# Patient Record
Sex: Male | Born: 1965
Health system: Southern US, Community
[De-identification: ages and names within clinical notes are randomized; demographics above are authoritative.]

## PROBLEM LIST (undated history)

## (undated) DIAGNOSIS — R079 Chest pain, unspecified: Secondary | ICD-10-CM

## (undated) DIAGNOSIS — K219 Gastro-esophageal reflux disease without esophagitis: Secondary | ICD-10-CM

## (undated) DIAGNOSIS — K449 Diaphragmatic hernia without obstruction or gangrene: Secondary | ICD-10-CM

## (undated) DIAGNOSIS — R55 Syncope and collapse: Secondary | ICD-10-CM

## (undated) DIAGNOSIS — G8929 Other chronic pain: Secondary | ICD-10-CM

## (undated) DIAGNOSIS — I10 Essential (primary) hypertension: Secondary | ICD-10-CM

## (undated) DIAGNOSIS — E2609 Other primary hyperaldosteronism: Secondary | ICD-10-CM

## (undated) DIAGNOSIS — D497 Neoplasm of unspecified behavior of endocrine glands and other parts of nervous system: Secondary | ICD-10-CM

## (undated) DIAGNOSIS — R569 Unspecified convulsions: Secondary | ICD-10-CM

## (undated) DIAGNOSIS — M1990 Unspecified osteoarthritis, unspecified site: Secondary | ICD-10-CM

## (undated) DIAGNOSIS — M545 Low back pain, unspecified: Secondary | ICD-10-CM

## (undated) DIAGNOSIS — I2510 Atherosclerotic heart disease of native coronary artery without angina pectoris: Secondary | ICD-10-CM

## (undated) DIAGNOSIS — G4730 Sleep apnea, unspecified: Secondary | ICD-10-CM

## (undated) DIAGNOSIS — F419 Anxiety disorder, unspecified: Secondary | ICD-10-CM

## (undated) DIAGNOSIS — K2270 Barrett's esophagus without dysplasia: Secondary | ICD-10-CM

## (undated) DIAGNOSIS — E785 Hyperlipidemia, unspecified: Secondary | ICD-10-CM

## (undated) DIAGNOSIS — M549 Dorsalgia, unspecified: Secondary | ICD-10-CM

## (undated) DIAGNOSIS — F32A Depression, unspecified: Secondary | ICD-10-CM

## (undated) DIAGNOSIS — F329 Major depressive disorder, single episode, unspecified: Secondary | ICD-10-CM

## (undated) DIAGNOSIS — E119 Type 2 diabetes mellitus without complications: Secondary | ICD-10-CM

## (undated) DIAGNOSIS — F4310 Post-traumatic stress disorder, unspecified: Secondary | ICD-10-CM

## (undated) DIAGNOSIS — N200 Calculus of kidney: Secondary | ICD-10-CM

## (undated) HISTORY — DX: Hyperlipidemia, unspecified: E78.5

## (undated) HISTORY — PX: LITHOTRIPSY: SUR834

## (undated) HISTORY — PX: KNEE ARTHROSCOPY: SHX127

## (undated) HISTORY — DX: Chest pain, unspecified: R07.9

## (undated) HISTORY — PX: KIDNEY STONE SURGERY: SHX686

## (undated) HISTORY — DX: Gastro-esophageal reflux disease without esophagitis: K21.9

## (undated) HISTORY — DX: Diaphragmatic hernia without obstruction or gangrene: K44.9

## (undated) HISTORY — DX: Other primary hyperaldosteronism: E26.09

## (undated) HISTORY — DX: Unspecified convulsions: R56.9

## (undated) HISTORY — DX: Other chronic pain: G89.29

## (undated) HISTORY — DX: Unspecified osteoarthritis, unspecified site: M19.90

## (undated) HISTORY — DX: Barrett's esophagus without dysplasia: K22.70

---
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
## 1998-11-23 ENCOUNTER — Encounter: Payer: Self-pay | Admitting: Surgery

## 1998-11-23 ENCOUNTER — Ambulatory Visit (HOSPITAL_COMMUNITY): Admission: RE | Admit: 1998-11-23 | Discharge: 1998-11-23 | Payer: Self-pay | Admitting: Surgery

## 1998-11-23 ENCOUNTER — Emergency Department (HOSPITAL_COMMUNITY): Admission: EM | Admit: 1998-11-23 | Discharge: 1998-11-23 | Payer: Self-pay

## 1999-08-14 ENCOUNTER — Encounter: Payer: Self-pay | Admitting: Urology

## 1999-08-14 ENCOUNTER — Encounter: Admission: RE | Admit: 1999-08-14 | Discharge: 1999-08-14 | Payer: Self-pay | Admitting: Urology

## 1999-11-19 ENCOUNTER — Ambulatory Visit (HOSPITAL_COMMUNITY): Admission: RE | Admit: 1999-11-19 | Discharge: 1999-11-19 | Payer: Self-pay | Admitting: Gastroenterology

## 1999-11-19 ENCOUNTER — Encounter (INDEPENDENT_AMBULATORY_CARE_PROVIDER_SITE_OTHER): Payer: Self-pay

## 2001-09-02 ENCOUNTER — Encounter: Payer: Self-pay | Admitting: Urology

## 2001-09-02 ENCOUNTER — Ambulatory Visit (HOSPITAL_BASED_OUTPATIENT_CLINIC_OR_DEPARTMENT_OTHER): Admission: RE | Admit: 2001-09-02 | Discharge: 2001-09-02 | Payer: Self-pay | Admitting: Urology

## 2002-04-25 ENCOUNTER — Emergency Department (HOSPITAL_COMMUNITY): Admission: EM | Admit: 2002-04-25 | Discharge: 2002-04-25 | Payer: Self-pay | Admitting: Emergency Medicine

## 2002-06-01 ENCOUNTER — Emergency Department (HOSPITAL_COMMUNITY): Admission: EM | Admit: 2002-06-01 | Discharge: 2002-06-01 | Payer: Self-pay | Admitting: Emergency Medicine

## 2002-06-01 ENCOUNTER — Encounter: Payer: Self-pay | Admitting: Emergency Medicine

## 2002-09-12 ENCOUNTER — Emergency Department (HOSPITAL_COMMUNITY): Admission: EM | Admit: 2002-09-12 | Discharge: 2002-09-12 | Payer: Self-pay | Admitting: Emergency Medicine

## 2002-09-12 ENCOUNTER — Encounter (INDEPENDENT_AMBULATORY_CARE_PROVIDER_SITE_OTHER): Payer: Self-pay | Admitting: *Deleted

## 2002-11-07 ENCOUNTER — Emergency Department (HOSPITAL_COMMUNITY): Admission: EM | Admit: 2002-11-07 | Discharge: 2002-11-07 | Payer: Self-pay | Admitting: Emergency Medicine

## 2002-12-28 ENCOUNTER — Encounter: Admission: RE | Admit: 2002-12-28 | Discharge: 2003-02-09 | Payer: Self-pay | Admitting: Orthopedic Surgery

## 2003-05-14 ENCOUNTER — Emergency Department (HOSPITAL_COMMUNITY): Admission: EM | Admit: 2003-05-14 | Discharge: 2003-05-15 | Payer: Self-pay | Admitting: Emergency Medicine

## 2003-05-15 ENCOUNTER — Encounter: Payer: Self-pay | Admitting: Emergency Medicine

## 2003-05-19 ENCOUNTER — Emergency Department (HOSPITAL_COMMUNITY): Admission: EM | Admit: 2003-05-19 | Discharge: 2003-05-20 | Payer: Self-pay | Admitting: Emergency Medicine

## 2003-08-02 ENCOUNTER — Encounter
Admission: RE | Admit: 2003-08-02 | Discharge: 2003-10-31 | Payer: Self-pay | Admitting: Physical Medicine and Rehabilitation

## 2003-08-29 ENCOUNTER — Emergency Department (HOSPITAL_COMMUNITY): Admission: EM | Admit: 2003-08-29 | Discharge: 2003-08-30 | Payer: Self-pay | Admitting: Emergency Medicine

## 2003-10-31 ENCOUNTER — Emergency Department (HOSPITAL_COMMUNITY): Admission: EM | Admit: 2003-10-31 | Discharge: 2003-10-31 | Payer: Self-pay | Admitting: Emergency Medicine

## 2004-01-08 ENCOUNTER — Emergency Department (HOSPITAL_COMMUNITY): Admission: EM | Admit: 2004-01-08 | Discharge: 2004-01-08 | Payer: Self-pay | Admitting: Family Medicine

## 2004-06-04 ENCOUNTER — Encounter
Admission: RE | Admit: 2004-06-04 | Discharge: 2004-09-02 | Payer: Self-pay | Admitting: Physical Medicine and Rehabilitation

## 2004-06-06 ENCOUNTER — Ambulatory Visit: Payer: Self-pay | Admitting: Physical Medicine and Rehabilitation

## 2004-06-13 ENCOUNTER — Encounter
Admission: RE | Admit: 2004-06-13 | Discharge: 2004-08-14 | Payer: Self-pay | Admitting: Physical Medicine and Rehabilitation

## 2004-06-13 ENCOUNTER — Ambulatory Visit: Payer: Self-pay | Admitting: Family Medicine

## 2004-06-28 ENCOUNTER — Emergency Department (HOSPITAL_COMMUNITY): Admission: EM | Admit: 2004-06-28 | Discharge: 2004-06-28 | Payer: Self-pay | Admitting: *Deleted

## 2004-07-01 ENCOUNTER — Ambulatory Visit (HOSPITAL_COMMUNITY): Admission: RE | Admit: 2004-07-01 | Discharge: 2004-07-01 | Payer: Self-pay | Admitting: Family Medicine

## 2004-07-01 ENCOUNTER — Ambulatory Visit: Payer: Self-pay | Admitting: Family Medicine

## 2004-07-12 ENCOUNTER — Ambulatory Visit: Payer: Self-pay | Admitting: Sports Medicine

## 2004-11-13 ENCOUNTER — Ambulatory Visit: Payer: Self-pay | Admitting: Family Medicine

## 2004-12-04 ENCOUNTER — Encounter (INDEPENDENT_AMBULATORY_CARE_PROVIDER_SITE_OTHER): Payer: Self-pay | Admitting: *Deleted

## 2004-12-04 ENCOUNTER — Ambulatory Visit (HOSPITAL_COMMUNITY): Admission: RE | Admit: 2004-12-04 | Discharge: 2004-12-04 | Payer: Self-pay | Admitting: Gastroenterology

## 2004-12-24 ENCOUNTER — Ambulatory Visit: Payer: Self-pay | Admitting: Family Medicine

## 2005-01-21 ENCOUNTER — Emergency Department (HOSPITAL_COMMUNITY): Admission: EM | Admit: 2005-01-21 | Discharge: 2005-01-21 | Payer: Self-pay | Admitting: Emergency Medicine

## 2005-01-29 ENCOUNTER — Ambulatory Visit (HOSPITAL_COMMUNITY): Admission: RE | Admit: 2005-01-29 | Discharge: 2005-01-29 | Payer: Self-pay | Admitting: Family Medicine

## 2005-01-29 ENCOUNTER — Ambulatory Visit: Payer: Self-pay | Admitting: Family Medicine

## 2005-02-19 ENCOUNTER — Ambulatory Visit: Payer: Self-pay | Admitting: Family Medicine

## 2005-02-26 ENCOUNTER — Ambulatory Visit: Payer: Self-pay | Admitting: Family Medicine

## 2005-03-11 ENCOUNTER — Ambulatory Visit: Payer: Self-pay | Admitting: Family Medicine

## 2005-03-28 ENCOUNTER — Ambulatory Visit: Payer: Self-pay | Admitting: Family Medicine

## 2005-04-22 ENCOUNTER — Emergency Department (HOSPITAL_COMMUNITY): Admission: EM | Admit: 2005-04-22 | Discharge: 2005-04-22 | Payer: Self-pay | Admitting: Family Medicine

## 2005-04-28 ENCOUNTER — Ambulatory Visit: Payer: Self-pay | Admitting: Family Medicine

## 2005-05-06 ENCOUNTER — Ambulatory Visit: Payer: Self-pay | Admitting: Sports Medicine

## 2005-05-19 ENCOUNTER — Ambulatory Visit: Payer: Self-pay | Admitting: Sports Medicine

## 2005-12-03 ENCOUNTER — Ambulatory Visit: Payer: Self-pay | Admitting: Family Medicine

## 2006-02-09 ENCOUNTER — Ambulatory Visit: Payer: Self-pay | Admitting: Sports Medicine

## 2006-03-28 ENCOUNTER — Emergency Department (HOSPITAL_COMMUNITY): Admission: EM | Admit: 2006-03-28 | Discharge: 2006-03-28 | Payer: Self-pay | Admitting: Emergency Medicine

## 2006-07-16 ENCOUNTER — Ambulatory Visit (HOSPITAL_COMMUNITY): Admission: RE | Admit: 2006-07-16 | Discharge: 2006-07-16 | Payer: Self-pay | Admitting: Sports Medicine

## 2006-07-16 ENCOUNTER — Ambulatory Visit: Payer: Self-pay | Admitting: Sports Medicine

## 2006-08-12 ENCOUNTER — Ambulatory Visit: Payer: Self-pay | Admitting: Sports Medicine

## 2006-08-12 ENCOUNTER — Ambulatory Visit: Payer: Self-pay | Admitting: Cardiology

## 2006-08-19 ENCOUNTER — Encounter: Payer: Self-pay | Admitting: Cardiology

## 2006-08-19 ENCOUNTER — Ambulatory Visit (HOSPITAL_COMMUNITY): Admission: RE | Admit: 2006-08-19 | Discharge: 2006-08-19 | Payer: Self-pay | Admitting: Family Medicine

## 2006-10-29 DIAGNOSIS — K22711 Barrett's esophagus with high grade dysplasia: Secondary | ICD-10-CM | POA: Insufficient documentation

## 2006-10-29 DIAGNOSIS — K2270 Barrett's esophagus without dysplasia: Secondary | ICD-10-CM

## 2007-01-06 ENCOUNTER — Ambulatory Visit: Payer: Self-pay | Admitting: Family Medicine

## 2007-01-06 ENCOUNTER — Telehealth: Payer: Self-pay | Admitting: *Deleted

## 2007-02-08 ENCOUNTER — Telehealth (INDEPENDENT_AMBULATORY_CARE_PROVIDER_SITE_OTHER): Payer: Self-pay | Admitting: Family Medicine

## 2007-02-25 ENCOUNTER — Telehealth: Payer: Self-pay | Admitting: *Deleted

## 2007-03-26 ENCOUNTER — Telehealth (INDEPENDENT_AMBULATORY_CARE_PROVIDER_SITE_OTHER): Payer: Self-pay | Admitting: *Deleted

## 2007-03-29 ENCOUNTER — Ambulatory Visit: Payer: Self-pay | Admitting: Family Medicine

## 2007-05-05 ENCOUNTER — Encounter (INDEPENDENT_AMBULATORY_CARE_PROVIDER_SITE_OTHER): Payer: Self-pay | Admitting: Family Medicine

## 2007-05-05 ENCOUNTER — Ambulatory Visit: Payer: Self-pay | Admitting: Family Medicine

## 2007-06-02 ENCOUNTER — Encounter (INDEPENDENT_AMBULATORY_CARE_PROVIDER_SITE_OTHER): Payer: Self-pay | Admitting: Family Medicine

## 2007-07-01 ENCOUNTER — Ambulatory Visit: Payer: Self-pay | Admitting: Family Medicine

## 2007-10-18 ENCOUNTER — Ambulatory Visit: Payer: Self-pay | Admitting: Sports Medicine

## 2007-12-23 ENCOUNTER — Ambulatory Visit: Payer: Self-pay | Admitting: Family Medicine

## 2008-02-11 ENCOUNTER — Encounter: Payer: Self-pay | Admitting: *Deleted

## 2008-02-29 ENCOUNTER — Telehealth (INDEPENDENT_AMBULATORY_CARE_PROVIDER_SITE_OTHER): Payer: Self-pay | Admitting: Family Medicine

## 2008-04-05 ENCOUNTER — Telehealth: Payer: Self-pay | Admitting: *Deleted

## 2008-05-02 ENCOUNTER — Ambulatory Visit (HOSPITAL_COMMUNITY): Admission: RE | Admit: 2008-05-02 | Discharge: 2008-05-02 | Payer: Self-pay | Admitting: Family Medicine

## 2008-05-02 ENCOUNTER — Ambulatory Visit: Payer: Self-pay | Admitting: Sports Medicine

## 2008-05-02 DIAGNOSIS — I10 Essential (primary) hypertension: Secondary | ICD-10-CM

## 2008-05-03 ENCOUNTER — Telehealth (INDEPENDENT_AMBULATORY_CARE_PROVIDER_SITE_OTHER): Payer: Self-pay | Admitting: Family Medicine

## 2008-05-03 ENCOUNTER — Emergency Department (HOSPITAL_COMMUNITY): Admission: EM | Admit: 2008-05-03 | Discharge: 2008-05-04 | Payer: Self-pay | Admitting: Emergency Medicine

## 2008-05-04 ENCOUNTER — Ambulatory Visit: Payer: Self-pay | Admitting: Family Medicine

## 2008-05-04 ENCOUNTER — Telehealth: Payer: Self-pay | Admitting: *Deleted

## 2008-05-09 ENCOUNTER — Encounter: Payer: Self-pay | Admitting: *Deleted

## 2008-05-10 ENCOUNTER — Encounter (INDEPENDENT_AMBULATORY_CARE_PROVIDER_SITE_OTHER): Payer: Self-pay | Admitting: Family Medicine

## 2008-05-18 ENCOUNTER — Ambulatory Visit: Payer: Self-pay | Admitting: Family Medicine

## 2008-05-18 ENCOUNTER — Encounter: Payer: Self-pay | Admitting: *Deleted

## 2008-06-02 ENCOUNTER — Ambulatory Visit: Payer: Self-pay | Admitting: Family Medicine

## 2008-06-02 DIAGNOSIS — G4700 Insomnia, unspecified: Secondary | ICD-10-CM

## 2008-11-22 ENCOUNTER — Ambulatory Visit: Payer: Self-pay | Admitting: Family Medicine

## 2008-11-23 ENCOUNTER — Telehealth (INDEPENDENT_AMBULATORY_CARE_PROVIDER_SITE_OTHER): Payer: Self-pay | Admitting: Family Medicine

## 2008-11-23 ENCOUNTER — Ambulatory Visit: Payer: Self-pay | Admitting: Family Medicine

## 2008-12-12 ENCOUNTER — Telehealth (INDEPENDENT_AMBULATORY_CARE_PROVIDER_SITE_OTHER): Payer: Self-pay | Admitting: Family Medicine

## 2008-12-26 ENCOUNTER — Ambulatory Visit: Payer: Self-pay | Admitting: Family Medicine

## 2009-02-01 ENCOUNTER — Encounter (INDEPENDENT_AMBULATORY_CARE_PROVIDER_SITE_OTHER): Payer: Self-pay | Admitting: Family Medicine

## 2009-06-28 ENCOUNTER — Ambulatory Visit: Payer: Self-pay | Admitting: Family Medicine

## 2009-08-07 ENCOUNTER — Encounter: Payer: Self-pay | Admitting: Family Medicine

## 2009-08-07 ENCOUNTER — Ambulatory Visit: Payer: Self-pay | Admitting: Family Medicine

## 2009-08-09 ENCOUNTER — Telehealth: Payer: Self-pay | Admitting: Family Medicine

## 2009-08-20 ENCOUNTER — Emergency Department (HOSPITAL_COMMUNITY): Admission: EM | Admit: 2009-08-20 | Discharge: 2009-08-21 | Payer: Self-pay | Admitting: Emergency Medicine

## 2009-08-27 ENCOUNTER — Ambulatory Visit: Payer: Self-pay | Admitting: Family Medicine

## 2009-08-27 ENCOUNTER — Encounter: Payer: Self-pay | Admitting: Family Medicine

## 2009-08-27 LAB — CONVERTED CEMR LAB
CO2: 29 meq/L (ref 19–32)
Calcium: 9.5 mg/dL (ref 8.4–10.5)
Creatinine, Ser: 0.74 mg/dL (ref 0.40–1.50)
Sodium: 144 meq/L (ref 135–145)

## 2009-08-29 ENCOUNTER — Ambulatory Visit: Payer: Self-pay | Admitting: Family Medicine

## 2009-09-19 ENCOUNTER — Telehealth: Payer: Self-pay | Admitting: Family Medicine

## 2009-09-20 ENCOUNTER — Ambulatory Visit: Payer: Self-pay | Admitting: Family Medicine

## 2009-09-20 ENCOUNTER — Encounter: Payer: Self-pay | Admitting: Family Medicine

## 2009-09-22 ENCOUNTER — Telehealth: Payer: Self-pay | Admitting: Family Medicine

## 2009-09-24 ENCOUNTER — Ambulatory Visit: Payer: Self-pay | Admitting: Family Medicine

## 2009-09-24 LAB — CONVERTED CEMR LAB
ALT: 14 units/L (ref 0–53)
AST: 25 units/L (ref 0–37)
CO2: 26 meq/L (ref 19–32)
Calcium: 9.1 mg/dL (ref 8.4–10.5)
Chloride: 98 meq/L (ref 96–112)
Creatinine, Ser: 0.74 mg/dL (ref 0.40–1.50)
Sodium: 139 meq/L (ref 135–145)
Total Bilirubin: 1 mg/dL (ref 0.3–1.2)
Total Protein: 7.5 g/dL (ref 6.0–8.3)

## 2009-10-14 ENCOUNTER — Emergency Department (HOSPITAL_COMMUNITY): Admission: EM | Admit: 2009-10-14 | Discharge: 2009-10-14 | Payer: Self-pay | Admitting: Emergency Medicine

## 2009-10-16 ENCOUNTER — Encounter: Payer: Self-pay | Admitting: Family Medicine

## 2009-10-17 ENCOUNTER — Ambulatory Visit: Payer: Self-pay | Admitting: Family Medicine

## 2009-10-22 ENCOUNTER — Ambulatory Visit: Payer: Self-pay | Admitting: Family Medicine

## 2009-10-22 DIAGNOSIS — R519 Headache, unspecified: Secondary | ICD-10-CM | POA: Insufficient documentation

## 2009-10-22 DIAGNOSIS — R51 Headache: Secondary | ICD-10-CM

## 2009-11-07 ENCOUNTER — Telehealth: Payer: Self-pay | Admitting: Family Medicine

## 2009-11-08 ENCOUNTER — Ambulatory Visit: Payer: Self-pay | Admitting: Family Medicine

## 2009-11-08 DIAGNOSIS — F411 Generalized anxiety disorder: Secondary | ICD-10-CM | POA: Insufficient documentation

## 2009-11-27 ENCOUNTER — Ambulatory Visit (HOSPITAL_COMMUNITY): Admission: RE | Admit: 2009-11-27 | Discharge: 2009-11-27 | Payer: Self-pay | Admitting: Gastroenterology

## 2009-11-27 ENCOUNTER — Encounter: Payer: Self-pay | Admitting: Family Medicine

## 2009-12-04 ENCOUNTER — Ambulatory Visit: Payer: Self-pay | Admitting: Family Medicine

## 2010-01-08 ENCOUNTER — Ambulatory Visit: Payer: Self-pay | Admitting: Family Medicine

## 2010-01-08 DIAGNOSIS — M1710 Unilateral primary osteoarthritis, unspecified knee: Secondary | ICD-10-CM

## 2010-01-16 ENCOUNTER — Encounter: Payer: Self-pay | Admitting: Family Medicine

## 2010-02-10 ENCOUNTER — Emergency Department (HOSPITAL_COMMUNITY): Admission: EM | Admit: 2010-02-10 | Discharge: 2010-02-11 | Payer: Self-pay | Admitting: Emergency Medicine

## 2010-05-13 ENCOUNTER — Ambulatory Visit: Payer: Self-pay | Admitting: Family Medicine

## 2010-06-24 ENCOUNTER — Telehealth: Payer: Self-pay | Admitting: *Deleted

## 2010-06-25 ENCOUNTER — Ambulatory Visit: Payer: Self-pay | Admitting: Family Medicine

## 2010-06-25 ENCOUNTER — Encounter: Payer: Self-pay | Admitting: Family Medicine

## 2010-06-25 ENCOUNTER — Ambulatory Visit (HOSPITAL_COMMUNITY): Admission: RE | Admit: 2010-06-25 | Discharge: 2010-06-25 | Payer: Self-pay | Admitting: Family Medicine

## 2010-06-26 ENCOUNTER — Ambulatory Visit: Payer: Self-pay | Admitting: Family Medicine

## 2010-06-26 DIAGNOSIS — E876 Hypokalemia: Secondary | ICD-10-CM | POA: Insufficient documentation

## 2010-06-26 LAB — CONVERTED CEMR LAB
CO2: 32 meq/L (ref 19–32)
Calcium: 10.4 mg/dL (ref 8.4–10.5)
Creatinine, Ser: 0.91 mg/dL (ref 0.40–1.50)
Glucose, Bld: 107 mg/dL — ABNORMAL HIGH (ref 70–99)
Sodium: 145 meq/L (ref 135–145)

## 2010-07-05 ENCOUNTER — Encounter: Payer: Self-pay | Admitting: Family Medicine

## 2010-10-01 NOTE — Consult Note (Signed)
Summary: [facility]  [facility]   Imported By: Bradly Bienenstock 12/04/2009 09:32:01  _____________________________________________________________________  External Attachment:    Type:   Image     Comment:   External Document
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
## 2010-10-01 NOTE — Assessment & Plan Note (Signed)
Summary: Headaches and HTN/KF   Vital Signs:  Patient profile:   45 year old male Height:      65.25 inches Weight:      145.3 pounds BMI:     24.08 Temp:     97.8 degrees F oral Pulse rate:   62 / minute BP sitting:   159 / 97  (right arm) Cuff size:   regular  Vitals Entered By: Jimmy Footman, CMA (June 25, 2010 8:52 AM) CC: Headache, vomiting, HTN x3 days Is Patient Diabetic? No   Primary Care Provider:  Angelena Sole MD  CC:  Headache, vomiting, and HTN x3 days.  History of Present Illness:       45 y.o. male history of HTN with labile control typically because he runs out of meds presents with HA and emesis x 3 days. Headache located all over, no change in vision associated with emesis NB/NB multiple times. Of note pt states he vomits typically daily at baseline and has been doing so for years. He noticed his blood pressure was elevated to 170's systolic and he has been out of his meds for 4 days because of a mix-up. Tried to take his meds this AM but had emesis right after. Admits to chest pain which is sharp in nature non radiating for years and occurs daily, also admits to occ SOB today feels like he cant get a breath in but otherwise can breath out okay. Denies fever, recent illness, loose stools, admits to decreased appeite   Takes only BP meds and his GF Prilosec   Allergies: 1)  Cortisone Acetate (Cortisone Acetate)  Past History:  Past Medical History: Barrett`s esophagus h/o kidney stones (06/2004) hydranitis - 01/2005 Moderately Large Hiatal Hernia severe anxiety with panic attacks   - have tried klonopin, Effexor, zoloft in the past - failure per pt due to ineffective and drowziness   - now on xanax with excellent response Hypertension ?Migraine HA  Physical Exam  General:  Well-developed,well-nourished,in no acute distress; alert,appropriate and cooperative throughout examination, squinting to the light. Vital signs noted, sitting in chair , no  signs of distress  Eyes:  Fundoscopic exam benign, no papilledema Mouth:  skightly tachy Mucous membranes Neck:  supple Lungs:  CTAB Heart:  RRR, no murmur Chest wall mild tenderness diffusely Abdomen:  Soft, mild TTP epigastrium and LUQ, no rebound, no gaurding, no masses, NABS Pulses:  2+ Extremities:  No edema Neurologic:  no focal deficits Psych:  Oriented X3, memory intact for recent and remote, normally interactive, and good eye contact.  Somewhat flat affect   Impression & Recommendations:  Problem # 1:  HYPERTENSION, ESSENTIAL (ICD-401.9) Assessment Deteriorated Symptomatic for elevated bp, no red flags on exam, pt chest pain very atypical and chronic in nature. Discussed with attending,EKG unchanged from previous. Plan given antiemetics in house and treat HA, pt monitored afterwards and doing well, restart BP meds tonight as previous and RTC in AM for recheck His updated medication list for this problem includes:    Benazepril-hydrochlorothiazide 10-12.5 Mg Tabs (Benazepril-hydrochlorothiazide) .Marland Kitchen... Take 2 tabs by mouth daily    Atenolol 50 Mg Tabs (Atenolol) .Marland Kitchen... Take 1 tab by mouth twice a day  Orders: Basic Met-[facility] ([id number]) [facility]- Est  Level 4 (09811)  Problem # 2:  HEADACHE (ICD-784.0) Assessment: Deteriorated  given abortive treatment, secondary to elevated BP, improved prior to leaving clinic The following medications were removed from the medication list:    Tylenol Extra Strength 500 Mg Tabs (Acetaminophen) .Marland KitchenMarland KitchenMarland KitchenMarland Kitchen  2 tabs by mouth every 8 hours    Ultram 50 Mg Tabs (Tramadol hcl) .Marland Kitchen... 1 tab by mouth every 6 hours as needed breakthrough pain    Vicodin 5-500 Mg Tabs (Hydrocodone-acetaminophen) .Marland Kitchen... 1-2 by mouth every 4-6 hours as needed His updated medication list for this problem includes:    Atenolol 50 Mg Tabs (Atenolol) .Marland Kitchen... Take 1 tab by mouth twice a day  Orders: [facility]- Est  Level 4 (47425)  Problem # 3:  EMESIS (ICD-787.03) Assessment:  New  Given abortive treatment as pt states can not afford any meds unclear cause, maybe secondary to long standing GERD/Barrets esophogus Hydration status okay  Orders: [facility]- Est  Level 4 (99214)  Complete Medication List: 1)  Benazepril-hydrochlorothiazide 10-12.5 Mg Tabs (Benazepril-hydrochlorothiazide) .... Take 2 tabs by mouth daily 2)  Atenolol 50 Mg Tabs (Atenolol) .... Take 1 tab by mouth twice a day  Other Orders: Ketorolac-Toradol 15mg  (Z5638) Promethazine up to 50mg  (V5643)  Patient Instructions: 1)  Take your blood pressure mediaction as directed 2)  We will call you if any labs are abnormal 3)  Return for a visit  tomorrow with Dr. Lelon Perla to  recheck your blood pressure and to make sure you are improving 4)  Try to the nausea medication prior to taking any meds if needed 5)  keep hydrated   Medication Administration  Injection # 1:    Medication: Ketorolac-Toradol 15mg     Diagnosis: DEGENERATIVE JOINT DISEASE, BOTH KNEES, SEVERE (ICD-715.96)    Route: IM    Site: RUOQ gluteus    Exp Date: 02/07/2012    Lot #: [id number]    Mfr: [email or web address]    Patient tolerated injection without complications    Given by: Jimmy Footman, CMA (June 25, 2010 10:07 AM)  Injection # 2:    Medication: Promethazine up to 50mg     Diagnosis: DEGENERATIVE JOINT DISEASE, BOTH KNEES, SEVERE (ICD-715.96)    Route: IM    Site: RUOQ gluteus    Exp Date: 01/31/2012    Lot #: [id number]    Mfr: [email or web address]    Patient tolerated injection without complications    Given by: Jimmy Footman, CMA (June 25, 2010 10:09 AM)  Orders Added: 1)  Basic Met-[facility] [[id number]] 2)  Ketorolac-Toradol 15mg  [J1885] 3)  Promethazine up to 50mg  [J2550] 4)  [facility]- Est  Level 4 [35573]
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
## 2010-10-01 NOTE — Progress Notes (Signed)
Summary: triage  Phone Note Call from Patient Call back at Home Phone [phone]   Caller: Patient Summary of Call: Knee pain can't bend it. Initial call taken by: Clydell Hakim,  September 19, 2009 2:34 PM  Follow-up for Phone Call        hx of knee surgery & arthritis in it. woke up this am & could not bend the knee. hurts to walk. has no insurance so he cannot see his ortho. has [facility] so can come here. states he needs a knee replacement but as a single dad, he cannot afford this. using tylenol which does not really help. appt for tomorrow at 8:30am. to continue rest,elevate, ice, compression. if much worse overnight, go to ED Follow-up by: Golden Circle RN,  September 19, 2009 2:47 PM

## 2010-10-01 NOTE — Assessment & Plan Note (Signed)
Summary: wound check/[place]/[facility]   Vital Signs:  Patient profile:   45 year old male Height:      65.25 inches Weight:      153 pounds BMI:     25.36 BSA:     1.77 Temp:     98.9 degrees F Pulse rate:   69 / minute BP sitting:   138 / 98  Vitals Entered By: Jone Baseman CMA (October 17, 2009 8:43 AM)  Serial Vital Signs/Assessments:  Comments: 9:08 AM BP recheck: 138/98 By: Jone Baseman CMA   CC: wound check Is Patient Diabetic? No Pain Assessment Patient in pain? yes     Location: finger Intensity: 8   Primary Care Provider:  Angelena Sole MD  CC:  wound check.  History of Present Illness: 1. R index finger wound Cut finger Sunday am  on band saw cutting  a ham bone. Ham bone caught and jerked his hand into the saw. Minimal blood loss.  Was seen at [facility] ED Sunday evening and received irrigation, sutures, and pain meds and 7 days of doxycycline. Last tetanus was 2007 and did not receive tetanus toxoid in the ED. Still having pain, but not worse. Nail is white. Has trouble moving the finger due to swelling. Has been cleaning with peroxide and water and applying antiboitic ointment to the area.   2. BP Taking benazepril/hctz and atenolol without problems. Scheduled for a follow-up appointment tomorrow to have this rechecked. eats a lot of canned vegetables. Doesn't eat out much. Avoids adding salt to food.   Habits & Providers  Alcohol-Tobacco-Diet     Tobacco Status: never  Current Medications (verified): 1)  Nexium 40 Mg Cpdr (Esomeprazole Magnesium) .... Take 1 Capsule By Mouth Twice A Day 2)  Phenadoz 25 Mg Supp (Promethazine Hcl) .... One Pr Two Times A Day As Needed Nausea 3)  Promethazine Hcl 25 Mg Tabs (Promethazine Hcl) .... 1/2 To 1 Tablet By Mouth Three Times A Day As Needed Nausea 4)  Alprazolam 0.25 Mg Tabs (Alprazolam) .... One By Mouth Three Times A Day As Needed Anxiety. Please Dispense The Odt Tabs 5)  Benazepril-Hydrochlorothiazide 10-12.5  Mg Tabs (Benazepril-Hydrochlorothiazide) .... Take 2 Tabs By Mouth Daily 6)  Atenolol 25 Mg Tabs (Atenolol) .Marland Kitchen.. 1 Tab By Mouth Twice A Day 7)  Fluoxetine Hcl 10 Mg Caps (Fluoxetine Hcl) .... Take 1 Tab By Mouth Daily 8)  K-Tabs 10 Meq Cr-Tabs (Potassium Chloride) .... Take 1 Tab By Mouth Daily 9)  Tylenol Extra Strength 500 Mg Tabs (Acetaminophen) .... 2 Tabs By Mouth Every 8 Hours 10)  Ultram 50 Mg Tabs (Tramadol Hcl) .Marland Kitchen.. 1 Tab By Mouth Every 6 Hours As Needed Breakthrough Pain 11)  Vicodin 5-500 Mg Tabs (Hydrocodone-Acetaminophen) .Marland Kitchen.. 1-2 By Mouth Every 4-6 Hours As Needed  Allergies (verified): 1)  Cortisone Acetate (Cortisone Acetate)  Review of Systems       denies fevers, chills, nausea, vomiting, or diarrhea; no syncope or presyncope. No headache.   Physical Exam  General:  VS Reviewed. Well appearing, NAD. Hypertensive.   Skin:  1 cm, curcumferential laceration with sutures and minimal dried blood in place at the end of R index finger. Swelling down to the PIP. Has limited ROM 2/2 pain and swelling. No signs of cellulitis or erthema. Nail is pale with some subungual blood. Tip of index finger is also pale. No erythema, drainage, or discharge in the area.    Impression & Recommendations:  Problem # 1:  LACERATION, FINGER (ICD-883.0)  Assessment New  I think pt will likely lose the fingernail. No sign of cellulitis. Continue abx. Instructed rinsing with warm water only. change gauze dressing with antibiotic ointment once a day (supplies given). follow-up next Monday with Dr. Lelon Perla if possible.   Orders: [facility]- Est Level  3 (54098)  Problem # 2:  HYPERTENSION, ESSENTIAL (ICD-401.9) Assessment: Improved  improved today. Diastolic remains elevated. Instructed to move his appointment for tomorrow to next week. Consider replacing atenolol with amlodipine if not at goal. discussed ways to limit sodium intake -- pt demonstrates a poor understanding of limiting dietary sodium.    His updated medication list for this problem includes:    Benazepril-hydrochlorothiazide 10-12.5 Mg Tabs (Benazepril-hydrochlorothiazide) .Marland Kitchen... Take 2 tabs by mouth daily    Atenolol 25 Mg Tabs (Atenolol) .Marland Kitchen... 1 tab by mouth twice a day  Orders: [facility]- Est Level  3 (11914)  Complete Medication List: 1)  Nexium 40 Mg Cpdr (Esomeprazole magnesium) .... Take 1 capsule by mouth twice a day 2)  Phenadoz 25 Mg Supp (Promethazine hcl) .... One pr two times a day as needed nausea 3)  Promethazine Hcl 25 Mg Tabs (Promethazine hcl) .... 1/2 to 1 tablet by mouth three times a day as needed nausea 4)  Alprazolam 0.25 Mg Tabs (Alprazolam) .... One by mouth three times a day as needed anxiety. please dispense the odt tabs 5)  Benazepril-hydrochlorothiazide 10-12.5 Mg Tabs (Benazepril-hydrochlorothiazide) .... Take 2 tabs by mouth daily 6)  Atenolol 25 Mg Tabs (Atenolol) .Marland Kitchen.. 1 tab by mouth twice a day 7)  Fluoxetine Hcl 10 Mg Caps (Fluoxetine hcl) .... Take 1 tab by mouth daily 8)  K-tabs 10 Meq Cr-tabs (Potassium chloride) .... Take 1 tab by mouth daily 9)  Tylenol Extra Strength 500 Mg Tabs (Acetaminophen) .... 2 tabs by mouth every 8 hours 10)  Ultram 50 Mg Tabs (Tramadol hcl) .Marland Kitchen.. 1 tab by mouth every 6 hours as needed breakthrough pain 11)  Vicodin 5-500 Mg Tabs (Hydrocodone-acetaminophen) .Marland Kitchen.. 1-2 by mouth every 4-6 hours as needed  Patient Instructions: 1)  continue the bandages with antibiotic ointment -- change it once a day. 2)  try to keep your hand elevated as much possible.  3)  clean the finger by running under warm water once a day -- that's all you need to do. 4)  follow-up here on Monday with Dr. Lelon Perla to look at getting the stitches out and to check you blood pressure.  5)  if, before then, your pain gets worse, you start having fevers, or you have other concerns, see a doctor.  Prescriptions: VICODIN 5-500 MG TABS (HYDROCODONE-ACETAMINOPHEN) 1-2 by mouth every 4-6 hours as  needed  #30 x 0   Entered and Authorized by:   Myrtie Soman  MD   Signed by:   Myrtie Soman  MD on 10/17/2009   Method used:   Print then Give to Patient   RxID:   [id number]    Prevention & Chronic Care Immunizations   Influenza vaccine: Not documented    Tetanus booster: 07/02/2006: Done.    Pneumococcal vaccine: Not documented  Other Screening   Smoking status: never  (10/17/2009)  Lipids   Total Cholesterol: Not documented   LDL: Not documented   LDL Direct: Not documented   HDL: Not documented   Triglycerides: Not documented  Hypertension   Last Blood Pressure: 138 / 98  (10/17/2009)   Serum creatinine: 0.74  (09/20/2009)   Serum potassium 3.5  (09/20/2009)  Hypertension flowsheet reviewed?: Yes   Progress toward BP goal: Improved  Self-Management Support :   Personal Goals (by the next clinic visit) :      Personal blood pressure goal: 140/90  (08/07/2009)   Hypertension self-management support: BP self-monitoring log, Written self-care plan  (09/20/2009)    Hypertension self-management support not done because: Good outcomes  (10/17/2009)

## 2010-10-01 NOTE — Assessment & Plan Note (Signed)
Summary: elbow pain wp   Vital Signs:  Patient Profile:   45 Years Old Male Height:     67.5 inches Weight:      177 pounds Pulse rate:   92 / minute BP sitting:   107 / 71  Vitals Entered By: Lillia Pauls CMA (December 23, 2007 3:47 PM)                 PCP:  Lupita Raider MD  Chief Complaint:  elbow pain x 2 mos.  History of Present Illness: Seen in past for lateral epicondylitis.  Uses pain meds as needed, uses the strap, pain has flaired.  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  Does no want an injection states "I am allergic to cortisone", history of bleeding ulcers and cannot take NSAIDS.  Did not do exercises that were given in Feb.  Discussed physical therapy and feels that he does not have time, single father who work full time.    Current Allergies: CORTISONE ACETATE (CORTISONE ACETATE)      Physical Exam  General:     Well-developed,well-nourished,in no acute distress; alert,appropriate and cooperative throughout examination Msk:     Tender along lateral epicondyle, pain with extension of elbow.  No reddness noted.    Impression & Recommendations:  Problem # 1:  EPICONDYLITIS, RIGHT (ICD-726.32) Chronic problem, may need surgery in future.  Stressed importance of exercises and gave a handout of them.  Refilled Vicodin #20, no refills. Orders: [facility]- Est Level  2 (09811)   Complete Medication List: 1)  Benicar Hct 40-12.5 Mg Tabs (Olmesartan medoxomil-hctz) .... Take 1 tablet by mouth once a day 2)  Nexium 40 Mg Cpdr (Esomeprazole magnesium) .... Take 1 capsule by mouth twice a day 3)  Norvasc 10 Mg Tabs (Amlodipine besylate) .... Take 1 tablet by mouth once a day 4)  Flexeril 5 Mg Tabs (Cyclobenzaprine hcl) .Marland Kitchen.. 1-2 tablets by mouth at bedtime as needed for back pain - do not drive or drink alcohol while taking 5)  Vicodin 5-500 Mg Tabs (Hydrocodone-acetaminophen) .Marland Kitchen.. 1 tablet by mouth evry 6 hours as needed for back pain - do not  drive or drink alcohol while taking     Prescriptions: VICODIN 5-500 MG  TABS (HYDROCODONE-ACETAMINOPHEN) 1 tablet by mouth evry 6 hours as needed for back pain - do not drive or drink alcohol while taking  #20 x 0   Entered and Authorized by:   Luretha Murphy NP   Signed by:   Luretha Murphy NP on 12/23/2007   Method used:   Print then Give to Patient   RxID:   [id number]  ]

## 2010-10-01 NOTE — Assessment & Plan Note (Signed)
Summary: h/a,df   Vital Signs:  Patient profile:   45 year old male Height:      65.25 inches Weight:      150.7 pounds BMI:     24.98 Temp:     98.0 degrees F oral Pulse rate:   62 / minute BP sitting:   152 / 90  (left arm) Cuff size:   regular  Vitals Entered By: Garen Grams LPN (May 13, 2010 4:14 PM) CC: headachex 3 days Is Patient Diabetic? No Pain Assessment Patient in pain? yes     Location: headache   Primary Care Provider:  Angelena Sole MD  CC:  headachex 3 days.  History of Present Illness: HEADACHE  Onset: 3d Location: bifrontal and bioccipital Description: throbbing  Symptoms Nausea/vomiting: Yes, vomiting x1 this AM. Photophobia: YES Phonophobia: YES Tearing of eyes: No Sinus pain/pressure: no  Red Flags Fever: NO Neck pain/stiffness: NO Vision/speech difficulty: NO Focal weakness or numbness: NO Altered mental status: NO Trauma: NO Worse in am: NO Anticoagulant use: NO Immunocompromise: NO  Family history of Migraine: NO    Habits & Providers  Alcohol-Tobacco-Diet     Tobacco Status: never     Tobacco Counseling: not indicated; no tobacco use     Cigarette Packs/Day: <0.25  Current Medications (verified): 1)  Nexium 40 Mg Cpdr (Esomeprazole Magnesium) .... Take 1 Capsule By Mouth Twice A Day 2)  Promethazine Hcl 25 Mg Tabs (Promethazine Hcl) .... 1/2 To 1 Tablet By Mouth Three Times A Day As Needed Nausea 3)  Alprazolam 0.25 Mg Tabs (Alprazolam) .... One By Mouth Three Times A Day As Needed Anxiety. Please Dispense The Odt Tabs 4)  Benazepril-Hydrochlorothiazide 10-12.5 Mg Tabs (Benazepril-Hydrochlorothiazide) .... Take 2 Tabs By Mouth Daily 5)  Atenolol 50 Mg Tabs (Atenolol) .... Take 1 Tab By Mouth Twice A Day 6)  Fluoxetine Hcl 10 Mg Caps (Fluoxetine Hcl) .... Take 1 Tab By Mouth Daily 7)  K-Tabs 10 Meq Cr-Tabs (Potassium Chloride) .... Take 1 Tab By Mouth Daily 8)  Tylenol Extra Strength 500 Mg Tabs (Acetaminophen) ....  2 Tabs By Mouth Every 8 Hours 9)  Ultram 50 Mg Tabs (Tramadol Hcl) .Marland Kitchen.. 1 Tab By Mouth Every 6 Hours As Needed Breakthrough Pain 10)  Vicodin 5-500 Mg Tabs (Hydrocodone-Acetaminophen) .Marland Kitchen.. 1-2 By Mouth Every 4-6 Hours As Needed  Allergies (verified): 1)  Cortisone Acetate (Cortisone Acetate)  Review of Systems       See HPI  Physical Exam  General:  Well-developed,well-nourished,in no acute distress; alert,appropriate and cooperative throughout examination, squinting to the light. Head:  Normocephalic and atraumatic without obvious abnormalities. No apparent alopecia or balding. Eyes:  No corneal or conjunctival inflammation noted. EOMI. Perrla. Funduscopic exam benign, without hemorrhages, exudates or papilledema. Vision grossly normal. Ears:  External ear exam shows no significant lesions or deformities.   Nose:  External nasal examination shows no deformity or inflammation.  Mouth:  Oral mucosa and oropharynx without lesions or exudates.  Neck:  No deformities, masses, or tenderness noted. Lungs:  Normal respiratory effort, chest expands symmetrically. Lungs are clear to auscultation, no crackles or wheezes. Heart:  Normal rate and regular rhythm. S1 and S2 normal without gallop, murmur, click, rub or other extra sounds. Neurologic:  No cranial nerve deficits noted. Station and gait are normal. Plantar reflexes are down-going bilaterally. DTRs are symmetrical throughout. Sensory, motor and coordinative functions appear intact.  Brudzinski and Kernig signs neg.   Impression & Recommendations:  Problem # 1:  HEADACHE (  ICD-784.0) Assessment Deteriorated Pt has been off all BP meds and BP elevated however not severely.   Symptoms suggestive of migraine headache, coming off atenolol may have triggered this as BBs can prevent migraines. Normal neuro exam and fundoscopy makes intracranial pathology unlikely. Toradol 30mg  IM Phenergan for home. Refilled vicodin. RTC tomorrow if no  better.  His updated medication list for this problem includes:    Atenolol 50 Mg Tabs (Atenolol) .Marland Kitchen... Take 1 tab by mouth twice a day    Tylenol Extra Strength 500 Mg Tabs (Acetaminophen) .Marland Kitchen... 2 tabs by mouth every 8 hours    Ultram 50 Mg Tabs (Tramadol hcl) .Marland Kitchen... 1 tab by mouth every 6 hours as needed breakthrough pain    Vicodin 5-500 Mg Tabs (Hydrocodone-acetaminophen) .Marland Kitchen... 1-2 by mouth every 4-6 hours as needed  Orders: Ketorolac-Toradol 15mg  (W1027) [facility]- Est  Level 4 (25366)  Problem # 2:  HYPERTENSION, ESSENTIAL (ICD-401.9) Assessment: Improved Pt has BP meds, advised atenolol would ideally be once daily dosing.  BP also not under ideal control. To PCP for further management.  His updated medication list for this problem includes:    Benazepril-hydrochlorothiazide 10-12.5 Mg Tabs (Benazepril-hydrochlorothiazide) .Marland Kitchen... Take 2 tabs by mouth daily    Atenolol 50 Mg Tabs (Atenolol) .Marland Kitchen... Take 1 tab by mouth twice a day  Orders: [facility]- Est  Level 4 (44034)  Complete Medication List: 1)  Nexium 40 Mg Cpdr (Esomeprazole magnesium) .... Take 1 capsule by mouth twice a day 2)  Promethazine Hcl 25 Mg Tabs (Promethazine hcl) .... 1/2 to 1 tablet by mouth three times a day as needed nausea 3)  Alprazolam 0.25 Mg Tabs (Alprazolam) .... One by mouth three times a day as needed anxiety. please dispense the odt tabs 4)  Benazepril-hydrochlorothiazide 10-12.5 Mg Tabs (Benazepril-hydrochlorothiazide) .... Take 2 tabs by mouth daily 5)  Atenolol 50 Mg Tabs (Atenolol) .... Take 1 tab by mouth twice a day 6)  Fluoxetine Hcl 10 Mg Caps (Fluoxetine hcl) .... Take 1 tab by mouth daily 7)  K-tabs 10 Meq Cr-tabs (Potassium chloride) .... Take 1 tab by mouth daily 8)  Tylenol Extra Strength 500 Mg Tabs (Acetaminophen) .... 2 tabs by mouth every 8 hours 9)  Ultram 50 Mg Tabs (Tramadol hcl) .Marland Kitchen.. 1 tab by mouth every 6 hours as needed breakthrough pain 10)  Vicodin 5-500 Mg Tabs  (Hydrocodone-acetaminophen) .Marland Kitchen.. 1-2 by mouth every 4-6 hours as needed  Patient Instructions: 1)  Great to see you, 2)  You are probably having a migraine type headache.   3)  Shot of Toradol in the office. 4)  Refilled vicodin and phenergan. 5)  Come back tomorrow if not feeling any better. 6)  -Dr. Karie Schwalbe. Prescriptions: VICODIN 5-500 MG TABS (HYDROCODONE-ACETAMINOPHEN) 1-2 by mouth every 4-6 hours as needed  #30 x 0   Entered and Authorized by:   Rodney Langton MD   Signed by:   Rodney Langton MD on 05/13/2010   Method used:   Print then Give to Patient   RxID:   [id number] PROMETHAZINE HCL 25 MG TABS (PROMETHAZINE HCL) 1/2 to 1 tablet by mouth three times a day as needed nausea  #60 x 0   Entered and Authorized by:   Rodney Langton MD   Signed by:   Rodney Langton MD on 05/13/2010   Method used:   Print then Give to Patient   RxID:   [id number]    Medication Administration  Injection # 1:    Medication: Ketorolac-Toradol 15mg   Diagnosis: HEADACHE (ICD-784.0)    Route: IM    Site: RUOQ gluteus    Exp Date: 07/03/2011    Lot #: [id number]    Mfr: Hospira    Comments: Patient recieved 30 mg of Toradol    Patient tolerated injection without complications    Given by: Garen Grams LPN (May 13, 2010 5:18 PM)  Orders Added: 1)  Ketorolac-Toradol 15mg  [J1885] 2)  [facility]- Est  Level 4 [09811]

## 2010-10-01 NOTE — Assessment & Plan Note (Signed)
Summary: f/u finger cut/[place]   Vital Signs:  Patient profile:   45 year old male Height:      65.25 inches Weight:      158 pounds BMI:     26.19 Temp:     97.8 degrees F oral BP sitting:   150 / 90  (left arm)  Vitals Entered By: Tessie Fass CMA (November 08, 2009 8:41 AM) CC: F/U finger wound Is Patient Diabetic? No Pain Assessment Patient in pain? yes     Location: neck, chest Intensity: 5   Primary Care Provider:  Angelena Sole MD  CC:  F/U finger wound.  History of Present Illness: Andrew Huerta comes in today to discuss several issues: 1) finger laceration - cut his finger approx 3 weeks ago.  Has been seen her twice for this problem.  Please see previous notes for details.  Last night the skin on the tip of his finger looked "strange" like maybe "it wasn't healing".  Skin fell off while he was sleeping and finger now looking fine this morning.  Still tender at the fingertip.  Swelling greatly improved and motion improving.  2) EArache - left ear.  STarted yesterday.  Shooting pain behind his left ear.  No hearing chagnes.  NO fever.  No cough, congestion, runny nose, or sore throat.   3) RAsh - girlfriend noticed rash around his waistline yesterday after he got out of shower.  Not itchy.  No new soaps or clothes.  Only on abdomen at waist/belt line.  Nowhere else.  No spreading or changing since yesterday.   4) Anxiety - has used xanax in the past.  Out now.  Has EGD in a few weeks "looking for Cancer" becasue he has history of barrett's esophagus and other stomach issues.  Very nervous about this and having trouble sleeping.  History of alprazolam use reviewed - has not been overusing.  ONly 2 prescriptions in the last year, last one was 2.5 months ago for 1 month supply.   Habits & Providers  Alcohol-Tobacco-Diet     Tobacco Status: never  Allergies: 1)  Cortisone Acetate (Cortisone Acetate)  Physical Exam  General:  alert, NAD, vitals reviewed Ears:  R ear normal,  normal TM L ear normal, normal TM.  NO pain to palpation or lesions seen behind left ear Skin:  Right index finger with superficial layer of skin missing at tip.  Pink skin, minimally tender to palpation.  Nail cracked and some subungual blood but base of nail growing in healthily.   Limited motion at DIP but good motion at PIP.  Very minimal swelling.   At waistline scattered petichiae.  Nonblanching.  Not assocciated with hair follicles.  NO scabs or evidence of excoriation Cervical Nodes:  no cervical LAD   Impression & Recommendations:  Problem # 1:  LACERATION, FINGER (ICD-883.0) Assessment Improved  Healing well.  Continue current care.   Orders: [facility]- Est  Level 4 (99214)  Problem # 2:  EAR PAIN, LEFT (ICD-388.70) Assessment: New  Normal exam. Unclear etiology.  Possibly muscular given actual location of pain.  Advised heating pad if pain returns.   Orders: [facility]- Est  Level 4 (69629)  Problem # 3:  SKIN RASH (ICD-782.1) Assessment: New  Not ichy.  No sign of infection.  Unclear etiology but no treatment indicated at this time.   Orders: [facility]- Est  Level 4 (99214)  Problem # 4:  ANXIETY (ICD-300.00) Assessment: Unchanged  No history of misuse of xanax.  Will refill Rx x 1 and advised he must discuss with his PCP at next visit.  His updated medication list for this problem includes:    Alprazolam 0.25 Mg Tabs (Alprazolam) ..... One by mouth three times a day as needed anxiety. please dispense the odt tabs    Fluoxetine Hcl 10 Mg Caps (Fluoxetine hcl) .Marland Kitchen... Take 1 tab by mouth daily  Orders: [facility]- Est  Level 4 (99214)  Complete Medication List: 1)  Nexium 40 Mg Cpdr (Esomeprazole magnesium) .... Take 1 capsule by mouth twice a day 2)  Phenadoz 25 Mg Supp (Promethazine hcl) .... One pr two times a day as needed nausea 3)  Promethazine Hcl 25 Mg Tabs (Promethazine hcl) .... 1/2 to 1 tablet by mouth three times a day as needed nausea 4)  Alprazolam 0.25 Mg Tabs  (Alprazolam) .... One by mouth three times a day as needed anxiety. please dispense the odt tabs 5)  Benazepril-hydrochlorothiazide 10-12.5 Mg Tabs (Benazepril-hydrochlorothiazide) .... Take 2 tabs by mouth daily 6)  Atenolol 50 Mg Tabs (Atenolol) .... Take 1 tab by mouth twice a day 7)  Fluoxetine Hcl 10 Mg Caps (Fluoxetine hcl) .... Take 1 tab by mouth daily 8)  K-tabs 10 Meq Cr-tabs (Potassium chloride) .... Take 1 tab by mouth daily 9)  Tylenol Extra Strength 500 Mg Tabs (Acetaminophen) .... 2 tabs by mouth every 8 hours 10)  Ultram 50 Mg Tabs (Tramadol hcl) .Marland Kitchen.. 1 tab by mouth every 6 hours as needed breakthrough pain 11)  Vicodin 5-500 Mg Tabs (Hydrocodone-acetaminophen) .Marland Kitchen.. 1-2 by mouth every 4-6 hours as needed  Patient Instructions: 1)  Your finger looks great.  Keep washing it once a day.  Wear gloves at work and leave it open to the air at home.  2)  Your ear looks fine.  Try using a heating pad when it bothers you.  If you develop fever or it isn't better in a week, please let us know. 3)  Your rash is nothing to worry about.  It will likely resolve in the next week.  If it spreads or begins to itch, let us know. 4)  Please keep your appt with Dr. Lelon Perla in 1 month.  Be sure to talk about your anxiety with him. 5)  Have a good weekend.  Prescriptions: ALPRAZOLAM 0.25 MG TABS (ALPRAZOLAM) one by mouth three times a day as needed anxiety. please dispense the ODT tabs  #90 x 0   Entered and Authorized by:   Ardeen Garland  MD   Signed by:   Ardeen Garland  MD on 11/08/2009   Method used:   Handwritten   RxID:   [id number]

## 2010-10-01 NOTE — Progress Notes (Signed)
Summary: elevated BP  Phone Note Call from Patient   Summary of Call: bp running high all day. missed appt with Dr. Lelon Perla yesterday due to job. BP this a.m. is 175/116 at lowest. having some headache. denies chest pain, SOB, slurred speech or other neuro findings. recommend taking two of benazepril/hctz tabs (total dose of 20/25 mg). patient already took two of atenolol tabs (dose of 50 mg). if in 2-3 hours, DBP >100, patient to take two additional atenolol (total dose of 50 mg). explained red flags to look out for (chest pain, SOB, neuro sxs, etc). those would required ED eval. otherwise, elevated BP alone is not emergent. he expressed understanding.  Initial call taken by: Lequita Asal  MD,  September 22, 2009 4:24 PM     Appended Document: elevated BP left message

## 2010-10-01 NOTE — Progress Notes (Signed)
  Phone Note Call from Patient   Caller: Patient Call For: [phone] Summary of Call: Patient having headaches and bp 191 over 118 last night.  Patient was waiting for his bp meds to be called in, but the pharmacy haven't heard anything from office.  Finally was able to get enough  meds from them last night to last until today.  Patient need to speak with nurse about being seen and getting refill. Initial call taken by: [name],  June 24, 2010 1:38 PM  Follow-up for Phone Call        Pt ran out of BP meds resulting in an elevated BP.  Now having HAs.  Refills were sent just now.  Pt states that the pharmacy gave him enough meds for a couple of days which he has taken.  States that when he lies down the HAs go away.  Told him that I do not have any more appts today but could make him an appt for the am.  Pt agreeable and states that if he is feeling better he will not come in.  Verified that he was not having any neurological changes and he denied these.   Follow-up by: Dennison Nancy RN,  June 24, 2010 2:06 PM

## 2010-10-01 NOTE — Miscellaneous (Signed)
Summary: walk in  Clinical Lists Changes came in asking for an earlier appt than later this week. over the weekend, he cut his index finger on the R hand with a saw. the nail was sutured back on & has stitches closing the rest of the wound. painful. taking vicodin. pain gets to 8/10. unable to come back today. appt made for 8:30am tomorrow.was sen at [place].Golden Circle RN  October 16, 2009 11:26 AM

## 2010-10-01 NOTE — Progress Notes (Signed)
Summary: triage  Phone Note Call from Patient Call back at [phone]   Caller: Patient Summary of Call: cut finger a few weeks ago and had stitches out - but the wound still looks open.  not sure if he needs to come in Initial call taken by: De Nurse,  November 07, 2009 8:44 AM  Follow-up for Phone Call        only hurts if he hits it. states stiches were taken out on day 8. describes an open wound. the edges are not together. offered appt today. he wants to be seen in am tomorrow.  Follow-up by: Golden Circle RN,  November 07, 2009 8:51 AM

## 2010-10-01 NOTE — Miscellaneous (Signed)
  Clinical Lists Changes  Problems: Removed problem of PANIC ATTACKS (ICD-300.01)

## 2010-10-01 NOTE — Letter (Signed)
Summary: [facility]  [facility] [place]   Imported By: Clydell Hakim 01/28/2010 08:53:36  _____________________________________________________________________  External Attachment:    Type:   Image     Comment:   External Document

## 2010-10-01 NOTE — Consult Note (Signed)
[facility]   Imported By: Clydell Hakim 01/28/2010 08:51:32  _____________________________________________________________________  External Attachment:    Type:   Image     Comment:   External Document

## 2010-10-01 NOTE — Assessment & Plan Note (Signed)
Summary: Arithitic flare of R knee and uncontrolled HTN   Vital Signs:  Patient profile:   45 year old male Height:      65.25 inches Weight:      155.7 pounds BMI:     25.80 Temp:     97.5 degrees F oral Pulse rate:   61 / minute BP sitting:   197 / 103  (left arm) Cuff size:   regular  Vitals Entered By: Garen Grams LPN (September 20, 2009 8:46 AM)  Serial Vital Signs/Assessments:  Time      Position  BP       Pulse  Resp  Temp     By                     176/96                         Marisue Ivan  MD  Comments: Right arm, sitting, manual By: Marisue Ivan  MD   CC: R knee pain Is Patient Diabetic? No Pain Assessment Patient in pain? yes     Location: rt leg Intensity: 8   Primary Care Provider:  Angelena Sole MD  CC:  R knee pain.  History of Present Illness: 45yo M w/ R knee pain  R knee pain: Started yesterday morning.  Denies any event, trauma, or known cause for the pain.  He has a chronic hx of R knee pain with 6 surgeries, no meniscus, multiple arthroscopic clean outs, followed by Dr. Madelon Lips at [facility].  States that he has been told that he needs a knee replacement.  He is a single father and has no insurance so he cannot afford the surgery or any further orthopedic visits or xrays.  He reports a localized allergy to cortisone injections resulting in diffuse swelling of the joint.  Denies any redness or fevers.  Still able to bear weight and walk but has some swelling more than usual. States that he cannot take NSAIDs b/c of gastric issues and has taken some vicodin in the past few days and is now out of medications.  Denies any locking, catching, or giving out.   States that ACL and PCL still intact.    HTN: No adverse effects from meds.  Not checking it regularly.  Was not well controlled at last visit.  No dizziness, HA, CP, palpitations, or LE swelling except that mention above.   Habits & Providers  Alcohol-Tobacco-Diet     Tobacco Status:  never  Current Medications (verified): 1)  Nexium 40 Mg Cpdr (Esomeprazole Magnesium) .... Take 1 Capsule By Mouth Twice A Day 2)  Phenadoz 25 Mg Supp (Promethazine Hcl) .... One Pr Two Times A Day As Needed Nausea 3)  Promethazine Hcl 25 Mg Tabs (Promethazine Hcl) .... 1/2 To 1 Tablet By Mouth Three Times A Day As Needed Nausea 4)  Alprazolam 0.25 Mg Tabs (Alprazolam) .... One By Mouth Three Times A Day As Needed Anxiety. Please Dispense The Odt Tabs 5)  Benazepril-Hydrochlorothiazide 10-12.5 Mg Tabs (Benazepril-Hydrochlorothiazide) .... Take 1 Tab By Mouth Daily 6)  Atenolol 25 Mg Tabs (Atenolol) .Marland Kitchen.. 1 Tab By Mouth Twice A Day 7)  Fluoxetine Hcl 10 Mg Caps (Fluoxetine Hcl) .... Take 1 Tab By Mouth Daily 8)  K-Tabs 10 Meq Cr-Tabs (Potassium Chloride) .... Take 1 Tab By Mouth Daily  Allergies (verified): 1)  Cortisone Acetate (Cortisone Acetate)  Past History:  Past Medical History: Last updated:  02/01/2009 Barrett`s esophagus h/o kidney stones (06/2004) hydranitis - 01/2005 Moderately Large Hiatal Hernia severe anxiety with panic attacks   - have tried klonopin, Effexor, zoloft in the past - failure per pt due to ineffective and drowziness   - now on xanax with excellent response  Past Surgical History: Last updated: 02/01/2009 2D Echo:  EF 65%, no abnormalities - 09/21/2006 EGD w/ biopsy- Barretts & hiatal hernia - 12/16/2004 ETT: >13Mets, No ST change, excess rise DBP: McDiarmid - 07/01/2004 Hx of multiple knee surgeries - 06/18/2004  Review of Systems        No dizziness, HA, CP, palpitations, or LE swelling except that mention above. Denies any redness or fevers.  Physical Exam  General:  VS Reviewed. Well appearing, NAD.  Msk:  R knee exam: Inspection- Mild effusion, no erythema, visible scar Palpation- mild crepitus with extension, no obvious deformity, mild joint line tenderness both medial and lateral ROM- 15 deg from full extension, nl flexion Strength- 5/5 No  laxity of ligaments neg ant drawer neg lachmans atalgic gait leg discrepancy- R>L with sitting up 1cm; equal with lying down   Impression & Recommendations:  Problem # 1:  ARTHRITIS (ICD-716.90) Assessment Deteriorated I suspect that this patient is experiencing pain from chronic arthritis of the knee joint especially since he has no cartilage present.  I could not find any previous xray of the knee in E-chart since he gets all of his imaging from [facility].  He refuses any further imaging b/c of financial restraints.  Plan to keep him active, avoid many weight loading activities of the knee, start on tylenol 1000mg  three times a day schedule, and offer ultram 50mg  q6 for breakthrough pain until the tylenol has had time to work.  He is to f/u with Dr. Lelon Perla.   Very low suspicion for joint infection or compromise of integrity of the ligaments.   Orders: Comp [facility] ([id number]) [facility]- Est  Level 4 ([address]11)  Problem # 2:  HYPERTENSION, ESSENTIAL (ICD-401.9) Assessment: Deteriorated Not at goal.  He has room to go up on all of his meds.  He is to f/u with Dr. Lelon Perla tomorrow and will leave it to Dr. Lelon Perla to adjust meds.  He is asymptomatic today.  His updated medication list for this problem includes:    Benazepril-hydrochlorothiazide 10-12.5 Mg Tabs (Benazepril-hydrochlorothiazide) .Marland Kitchen... Take 1 tab by mouth daily    Atenolol 25 Mg Tabs (Atenolol) .Marland Kitchen... 1 tab by mouth twice a day  Orders: Comp [facility] ([id number]) [facility]- Est  Level 4 (13086)  Complete Medication List: 1)  Nexium 40 Mg Cpdr (Esomeprazole magnesium) .... Take 1 capsule by mouth twice a day 2)  Phenadoz 25 Mg Supp (Promethazine hcl) .... One pr two times a day as needed nausea 3)  Promethazine Hcl 25 Mg Tabs (Promethazine hcl) .... 1/2 to 1 tablet by mouth three times a day as needed nausea 4)  Alprazolam 0.25 Mg Tabs (Alprazolam) .... One by mouth three times a day as needed anxiety. please dispense the odt tabs 5)   Benazepril-hydrochlorothiazide 10-12.5 Mg Tabs (Benazepril-hydrochlorothiazide) .... Take 1 tab by mouth daily 6)  Atenolol 25 Mg Tabs (Atenolol) .Marland Kitchen.. 1 tab by mouth twice a day 7)  Fluoxetine Hcl 10 Mg Caps (Fluoxetine hcl) .... Take 1 tab by mouth daily 8)  K-tabs 10 Meq Cr-tabs (Potassium chloride) .... Take 1 tab by mouth daily 9)  Tylenol Extra Strength 500 Mg Tabs (Acetaminophen) .... 2 tabs by mouth every 8 hours 10)  Ultram  50 Mg Tabs (Tramadol hcl) .Marland Kitchen.. 1 tab by mouth every 6 hours as needed breakthrough pain  Patient Instructions: 1)  Keep your f/u appt with Dr. Lelon Perla tomorrow to discuss your blood pressure and also bring up the Celexa. 2)  Today I think your knee is stable but likely with lots of chronic arthritis...therefore, I'm starting you on high dose tyelnol regimen that you will take scheduled and I gave you a pain medicine called ultram for breakthrough pain. 3)  We will check your liver today to make sure it is safe to take the tylenol.   Prescriptions: ULTRAM 50 MG TABS (TRAMADOL HCL) 1 tab by mouth every 6 hours as needed breakthrough pain  #20 x 0   Entered and Authorized by:   Marisue Ivan  MD   Signed by:   Marisue Ivan  MD on 09/20/2009   Method used:   Print then Give to Patient   RxID:   [id number]    Prevention & Chronic Care Immunizations   Influenza vaccine: Not documented    Tetanus booster: 07/02/2006: Done.    Pneumococcal vaccine: Not documented  Other Screening   Smoking status: never  (09/20/2009)  Lipids   Total Cholesterol: Not documented   LDL: Not documented   LDL Direct: Not documented   HDL: Not documented   Triglycerides: Not documented  Hypertension   Last Blood Pressure: 197 / 103  (09/20/2009)   Serum creatinine: 0.74  (08/27/2009)   Serum potassium 3.5  (08/27/2009) CMP ordered     Hypertension flowsheet reviewed?: Yes   Progress toward BP goal: Deteriorated  Self-Management Support :   Personal Goals  (by the next clinic visit) :      Personal blood pressure goal: 140/90  (08/07/2009)   Patient will work on the following items until the next clinic visit to reach self-care goals:     Medications and monitoring: take my medicines every day, check my blood pressure  (09/20/2009)     Eating: drink diet soda or water instead of juice or soda, eat more vegetables, use fresh or frozen vegetables, eat foods that are low in salt, eat baked foods instead of fried foods, eat fruit for snacks and desserts, limit or avoid alcohol  (09/20/2009)    Hypertension self-management support: BP self-monitoring log, Written self-care plan  (09/20/2009)   Hypertension self-care plan printed.
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
## 2010-11-28 ENCOUNTER — Emergency Department (HOSPITAL_COMMUNITY): Payer: Self-pay

## 2010-11-28 ENCOUNTER — Emergency Department (HOSPITAL_COMMUNITY)
Admission: EM | Admit: 2010-11-28 | Discharge: 2010-11-28 | Disposition: A | Discharge status: Home / Self Care | Payer: Self-pay | Attending: Emergency Medicine | Admitting: Emergency Medicine

## 2010-11-28 ENCOUNTER — Ambulatory Visit (INDEPENDENT_AMBULATORY_CARE_PROVIDER_SITE_OTHER): Payer: Self-pay | Admitting: Family Medicine

## 2010-11-28 ENCOUNTER — Encounter (HOSPITAL_COMMUNITY): Payer: Self-pay | Admitting: Radiology

## 2010-11-28 DIAGNOSIS — I10 Essential (primary) hypertension: Secondary | ICD-10-CM | POA: Insufficient documentation

## 2010-11-28 DIAGNOSIS — Z9889 Other specified postprocedural states: Secondary | ICD-10-CM | POA: Insufficient documentation

## 2010-11-28 DIAGNOSIS — F411 Generalized anxiety disorder: Secondary | ICD-10-CM | POA: Insufficient documentation

## 2010-11-28 DIAGNOSIS — R112 Nausea with vomiting, unspecified: Secondary | ICD-10-CM | POA: Insufficient documentation

## 2010-11-28 DIAGNOSIS — Z79899 Other long term (current) drug therapy: Secondary | ICD-10-CM | POA: Insufficient documentation

## 2010-11-28 DIAGNOSIS — E876 Hypokalemia: Secondary | ICD-10-CM | POA: Insufficient documentation

## 2010-11-28 DIAGNOSIS — M545 Low back pain: Secondary | ICD-10-CM

## 2010-11-28 DIAGNOSIS — R1032 Left lower quadrant pain: Secondary | ICD-10-CM | POA: Insufficient documentation

## 2010-11-28 DIAGNOSIS — R10814 Left lower quadrant abdominal tenderness: Secondary | ICD-10-CM | POA: Insufficient documentation

## 2010-11-28 DIAGNOSIS — K219 Gastro-esophageal reflux disease without esophagitis: Secondary | ICD-10-CM | POA: Insufficient documentation

## 2010-11-28 HISTORY — DX: Essential (primary) hypertension: I10

## 2010-11-28 LAB — COMPREHENSIVE METABOLIC PANEL
AST: 24 U/L (ref 0–37)
BUN: 6 mg/dL (ref 6–23)
CO2: 32 mEq/L (ref 19–32)
Chloride: 104 mEq/L (ref 96–112)
Creatinine, Ser: 0.72 mg/dL (ref 0.4–1.5)
GFR calc Af Amer: >60 mL/min (ref >60)
GFR calc non Af Amer: >60 mL/min (ref >60)
Glucose, Bld: 86 mg/dL (ref 70–99)
Total Bilirubin: 1 mg/dL (ref 0.3–1.2)

## 2010-11-28 LAB — DIFFERENTIAL
Lymphocytes Relative: 23 % (ref 12–46)
Lymphs Abs: 2.3 10*3/uL (ref 0.7–4.0)
Monocytes Absolute: 0.6 10*3/uL (ref 0.1–1.0)
Monocytes Relative: 6 % (ref 3–12)
Neutro Abs: 7.3 10*3/uL (ref 1.7–7.7)
Neutrophils Relative %: 71 % (ref 43–77)

## 2010-11-28 LAB — CBC
HCT: 42.9 % (ref 39.0–52.0)
Hemoglobin: 15.5 g/dL (ref 13.0–17.0)
MCH: 30.7 pg (ref 26.0–34.0)
MCV: 85 fL (ref 78.0–100.0)
RBC: 5.05 MIL/uL (ref 4.22–5.81)

## 2010-11-28 LAB — LIPASE, BLOOD: Lipase: 41 U/L (ref 11–59)

## 2010-11-28 LAB — URINALYSIS, ROUTINE W REFLEX MICROSCOPIC
Bilirubin Urine: NEGATIVE
Ketones, ur: NEGATIVE mg/dL
Leukocytes, UA: NEGATIVE
Nitrite: NEGATIVE
Urobilinogen, UA: 1 mg/dL (ref 0.0–1.0)

## 2010-11-28 MED ORDER — IOHEXOL 300 MG/ML  SOLN
80.0000 mL | Freq: Once | INTRAMUSCULAR | Status: AC | PRN
  Administered 2010-11-28: 80 mL via INTRAVENOUS

## 2010-11-28 NOTE — Progress Notes (Signed)
Nausea & Vomiting:  Pt has been vomiting the last 4 days. He says he is vomiting up everything he eats and drinks. He has LLQ pain when he cough and vomits but says it has been getting worse over the last 2 months and worse in the last few days. He has no change in stool, no blood in the stool.   Low Back pain: Pt has had low back pain for a long time. Reports known compression fractures in his back. Unclear if it is associated with vomiting as he reports having the back pain all the time.   Pt has multiple other complaints including 40 lbs weight loss in the last 3 years for no known reason with decreased appetite.  He has some urinary hesitancy but has never been tested for prostate ca. Will need to do this at a future appointment.  Has been tested for Barrets disease of the esophagus and is positive for it. Has been tested for ca related to it last year and it was neg.  HTN: BP is elevated because the patient has not been able to keep down his BP meds for the last 4 days.   ROS: neg except as noted in HPI  PE:  Gen: pt appears comfortable when first entering the room but became distressed during the exam.  HEENT: eyes make tears, mouth has moist mucus membranes, false teeth, no erythema in post pharynx.  CV; RRR, no murmurs, no tachycardic Pulm: CTAB, no wheezes Abd: diffuse tenderness with extreme tenderness in LLQ, pt began coughing and spitting up when LLQ palpated.  Ext: no edema.
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
## 2010-11-28 NOTE — Assessment & Plan Note (Signed)
Pt has had nausea and vomiting for the last 4 days. He says he has not been able to keep anything down the last 4 days and can not make urine today in the office. He also has left lower quadrant pain that when examined sends him into a coughing and vomiting fit. Plan to send him to the ED for further evaluation and possibly fluids.

## 2010-11-29 ENCOUNTER — Ambulatory Visit (INDEPENDENT_AMBULATORY_CARE_PROVIDER_SITE_OTHER): Payer: Self-pay | Admitting: Family Medicine

## 2010-11-29 ENCOUNTER — Encounter: Payer: Self-pay | Admitting: Family Medicine

## 2010-11-29 DIAGNOSIS — R109 Unspecified abdominal pain: Secondary | ICD-10-CM

## 2010-11-29 DIAGNOSIS — E876 Hypokalemia: Secondary | ICD-10-CM

## 2010-11-29 DIAGNOSIS — R112 Nausea with vomiting, unspecified: Secondary | ICD-10-CM

## 2010-11-29 DIAGNOSIS — N400 Enlarged prostate without lower urinary tract symptoms: Secondary | ICD-10-CM

## 2010-11-29 LAB — PSA: PSA: 0.42 ng/mL (ref <=4.00)

## 2010-11-29 MED ORDER — GI COCKTAIL ~~LOC~~: 30.0000 mL | Freq: Two times a day (BID) | ORAL | Status: AC | PRN

## 2010-11-29 MED ORDER — ONDANSETRON 4 MG PO TBDP: 4.0000 mg | ORAL_TABLET | Freq: Three times a day (TID) | ORAL | Status: AC | PRN

## 2010-11-29 MED ORDER — POTASSIUM CHLORIDE ER 10 MEQ PO TBCR: 20.0000 meq | EXTENDED_RELEASE_TABLET | Freq: Two times a day (BID) | ORAL | Status: DC

## 2010-11-29 NOTE — Progress Notes (Signed)
  Subjective:    Patient ID: Andrew Huerta, male    DOB: 22-Oct-1965, 45 y.o.   MRN: [id number]  HPI 1. Post hospital visit Was sent to ER yesterday for evaluation of N/V and abdominal pain. Patient had normal CT scan only showing mildly enlarged prostate with calcification. He had a small nonobstructing left kidney stone. The rest of his CT I evaluated and is unremarkable. He has no hernias on ct or exam. He has no evidence of appendicitis. He has no fevers. He was found in the ED to have hypokalemia from emesis. He says he is about the same as yesterday, but is no longer vomiting. He has new complaint of diarrhea.  2. Concern for enlarged prostate Prostate exam revealed a normal feeling prostate without nodules, or firmness, or bogginess. He has no urinary symptoms. He has no family members with history of prostate ca. He has a family member with stomach ca. He has a CT scan showing a mildly enlarged prostate with calcification. No sexual symptoms. He has positive weight loss.  3. Weight loss 40 lbs weight loss over two years. This is explained by stress and not eating related to a bad divorce from wife as well as inability to tolerate food because of frequent n/v. CT abd negative for obvious malignancy.  4. Diarrhea  See pertinent review of systems above. His diarrhea is one day long and is not profuse. No food association. He does not appear dehydrated.  Review of Systems Pertinent systems reviewed in HPI.    Objective:   Physical Exam  Constitutional: He appears well-developed and well-nourished. No distress.  HENT:  Head: Normocephalic and atraumatic.  Mouth/Throat: Oropharynx is clear and moist.  Cardiovascular: Normal rate and regular rhythm.   No murmur heard. Pulmonary/Chest: Effort normal and breath sounds normal.  Abdominal: Soft. Bowel sounds are normal. He exhibits no distension and no mass. There is tenderness. There is no rebound and no guarding.  Genitourinary: Rectum  normal and prostate normal. Rectal exam shows no fissure, no mass, no tenderness and anal tone normal. Prostate is not enlarged and not tender.  Skin: He is not diaphoretic.       Assessment & Plan:  Pt. Is a 45 y/o c/m with recent N/V and diarrhea associated with hypokalemia and recent ER evaluation without etiology identified. 1. F/u hospital visit - mildly improved. Vitals stable. Will follow prn 2. Enlarged prostate and weight loss - will obtain PSA. 3. Diarrhea - will follow for now. Will replete K with KDUR pills 4. Weight loss - likely secondary to patient's poor oral intake. He feels his weight loss is consistent with his intake.

## 2010-12-02 ENCOUNTER — Telehealth: Payer: Self-pay | Admitting: *Deleted

## 2010-12-02 LAB — BASIC METABOLIC PANEL
BUN: 8 mg/dL (ref 6–23)
Chloride: 102 mEq/L (ref 96–112)
Creatinine, Ser: 0.79 mg/dL (ref 0.4–1.5)

## 2010-12-02 LAB — DIFFERENTIAL
Lymphocytes Relative: 19 % (ref 12–46)
Lymphs Abs: 3 10*3/uL (ref 0.7–4.0)
Monocytes Absolute: 0.9 10*3/uL (ref 0.1–1.0)
Monocytes Relative: 6 % (ref 3–12)
Neutro Abs: 11.6 10*3/uL — ABNORMAL HIGH (ref 1.7–7.7)

## 2010-12-02 LAB — POCT CARDIAC MARKERS
CKMB, poc: <1 ng/mL — ABNORMAL LOW (ref 1.0–8.0)
Troponin i, poc: <0.05 ng/mL (ref 0.00–0.09)

## 2010-12-02 LAB — CBC
Hemoglobin: 15.1 g/dL (ref 13.0–17.0)
RBC: 5 MIL/uL (ref 4.22–5.81)

## 2010-12-02 NOTE — Telephone Encounter (Signed)
Pharmacist called and has questions about GI Cocktail Dr Rivka Safer prescribed today,please call them back at [phone]

## 2010-12-03 ENCOUNTER — Encounter: Payer: Self-pay | Admitting: Family Medicine

## 2010-12-23 ENCOUNTER — Other Ambulatory Visit: Payer: Self-pay | Admitting: Family Medicine

## 2010-12-23 NOTE — Telephone Encounter (Signed)
Refill request

## 2011-01-15 ENCOUNTER — Ambulatory Visit (INDEPENDENT_AMBULATORY_CARE_PROVIDER_SITE_OTHER): Payer: Self-pay | Admitting: Family Medicine

## 2011-01-15 ENCOUNTER — Encounter: Payer: Self-pay | Admitting: Family Medicine

## 2011-01-15 ENCOUNTER — Ambulatory Visit: Payer: Self-pay | Admitting: Family Medicine

## 2011-01-15 DIAGNOSIS — R51 Headache: Secondary | ICD-10-CM

## 2011-01-15 DIAGNOSIS — H1130 Conjunctival hemorrhage, unspecified eye: Secondary | ICD-10-CM

## 2011-01-15 NOTE — Progress Notes (Signed)
  Subjective:    Patient ID: Andrew Huerta, male    DOB: June 30, 1966, 44 y.o.   MRN: [id number]  HPI  Headache: Patient complains of headache. He does have a headache at this time.   Description of Headaches: Location of pain: left-sided unilateral Radiation of pain?:none Character of pain:stabbing Severity of pain: 8 Accompanying symptoms: photophobia, He feels a "tightness" around left temple area  Prodromal sx?: no Rapidity of onset: sudden. Started with son noticing that his left eye was red.  He has had HAs with elevated BP in the past.   Typical duration of individual headache: from yesterday to today he has had red left eye and left sided headache.  Are most headaches similar in presentation? no - Usually elevated BP headache are located in forehead and back of neck.  Typical precipitants: Elevated BP  Temporal Pattern of Headaches: Started having HAs: yesterday Worst time of day:n/a Awaken from sleep?: no Seasonal pattern?: no 'Clustering' of HAs over time? no Overall pattern since problem began: unchanged  Degree of Functional Impairment: no he was off work today because he was scheduled to be off, but he can still do housework  Current Use of Meds to Treat HA: Abortive meds? Took Vicodin at 10:30 AM today, which did not help headace Daily use? No.  He tries not to take anything on a daily basis.  He has been taking vicodin for back pain and knee pain.  Prophylactic meds? none  Additional Relevant History: History of head/neck trauma? no History of head/neck surgery? no Family h/o headache problems? no Use of meds that might worsen HAs? no Exposure to carbon monoxide? no Substance use: quit drinking sodas 1 month ago  No fever/chills, + nausea (chronic from ulcer and hernia), no vomiting, no stiff neck, no syncope, no AMS  He feels like there is something in his eye.   Review of Systems    per hpi Objective:   Physical Exam  Constitutional: He is oriented  to person, place, and time. He appears well-developed and well-nourished. No distress.  Eyes: EOM are normal. No foreign bodies found. Right conjunctiva has no hemorrhage. Left conjunctiva has a hemorrhage. No scleral icterus.  Fundoscopic exam:      The right eye shows no papilledema.       The left eye shows hemorrhage. The left eye shows no papilledema.    Neurological: He is oriented to person, place, and time. He has normal strength and normal reflexes. He is unresponsive. No cranial nerve deficit or sensory deficit. He displays a negative Romberg sign. He displays no Babinski's sign on the right side. He displays no Babinski's sign on the left side.          Assessment & Plan:

## 2011-01-15 NOTE — Assessment & Plan Note (Signed)
Red eye from subconjunctival hemorrhage in left eye.  No vision loss.  No papilledema.  Will monitor and pt to return to ED if he has vision loss.

## 2011-01-15 NOTE — Assessment & Plan Note (Signed)
Headache likely from subconjunctival hemorrhage.  Neuro exam negative.  NO red flags on exam. Will take tylenol prn.

## 2011-01-15 NOTE — Patient Instructions (Signed)
If you start to have vision loss go straight to the ER.   Subconjunctival Hemorrhage Your exam shows you have a subconjunctival hemorrhage. This is a harmless collection of blood covering a portion of the white of the eye. This condition may be due to injury or to straining (lifting, sneezing, or coughing). Often, there is no known cause. Subconjunctival blood does not cause pain or vision problems. This condition needs no treatment. It will take 1-2 weeks for the blood to dissolve. If you take aspirin or Coumadin on a daily basis or if you have high blood pressure, you should check with your doctor about the need for further treatment. Please call your doctor if you have problems with your vision, pain around the eye, or any other concerns about your condition. Document Released: 09/25/2004 Document Re-Released: 08/06/2009 [facility] Patient Information 2011 [place], [place].  Subconjunctival Hemorrhage A subconjunctival hemorrhage is a bright red patch covering a portion of the white of the eye. The white part of the eye is called the sclera, and it is covered by a thin membrane called the conjunctiva. This membrane is clear, except for tiny blood vessels that you can see with the naked eye. When your eye is irritated or inflamed and becomes red, it is because the vessels in the conjunctiva are swollen. Sometimes, a blood vessel in the conjunctiva can break and bleed. When this occurs, the blood builds up between the conjunctiva and the sclera, and spreads out to create a red area. The red spot may be very small at first. It may then spread to cover a larger part of the surface of the eye, or even all of the visible white part of the eye. In almost all cases, the blood will go away and the eye will become white again. Before completely dissolving, however, the red area may spread. It may also become brownish-yellow in color, before going away. If a lot of blood collects under the conjunctiva, it may look  like a bulge on the surface of the eye. This looks scary, but it will also eventually flatten out and go away. Subconjunctival hemorrhages do not cause pain, but if swollen, may cause a feeling of irritation. There is no effect on vision.  CAUSES  The most common cause is mild trauma (rubbing the eye, irritation).   Subconjunctival hemorrhages can happen because of coughing or straining (lifting heavy objects), vomiting, or sneezing.   In some cases, your doctor may want to check your blood pressure. High blood pressure can also cause a sunconjunctival hemorrhage.   Severe trauma or blunt injuries.   Diseases that affect blood clotting (hemophilia, leukemia).   Abnormalities of blood vessels behind the eye (carotid cavernous sinus fistula).   Tumors behind the eye.   Certain drugs (aspirin, coumadin, heparin).   Recent eye surgery.  HOME CARE INSTRUCTIONS  Do not worry about the appearance of your eye. You may continue your usual activities.   Often, follow-up is not necessary.  SEEK MEDICAL CARE IF:  Your eye becomes painful.   The bleeding does not disappear within 3 weeks.   Bleeding occurs elsewhere, for example, under the skin, in the mouth, or in the other eye.   You have recurring subconjunctival hemorrhages.  SEEK IMMEDIATE MEDICAL CARE IF:  Your vision changes or you have difficulty seeing.   You develop severe headache, persistent vomiting, confusion, or abnormal drowsiness (lethargy).   Your eye seems to bulge or protrude from the eye socket.   You  notice the sudden appearance of bruises, or have spontaneous bleeding elsewhere on your body.  Document Released: 08/18/2005 Document Re-Released: 11/12/2009 [facility] Patient Information 2011 [place], [place].
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
## 2011-01-17 NOTE — Op Note (Signed)
NAME:  Andrew Huerta, Andrew Huerta NO.:  [id number]   MEDICAL RECORD NO.:  [id number]          PATIENT TYPE:  AMB   LOCATION:  ENDO                         FACILITY:  [facility]   PHYSICIAN:  Petra Kuba, M.D.    DATE OF BIRTH:  1965/12/18   DATE OF PROCEDURE:  12/04/2004  DATE OF DISCHARGE:                                 OPERATIVE REPORT   PROCEDURE:  EGD with biopsy.   INDICATIONS:  Barrett's.  Due for repeat screening.  Consent was signed  after risks, benefits, methods, options were thoroughly discussed multiple  times in the past.   MEDICINES USED:  1.  Demerol 60.  2.  Versed 6.   PROCEDURE:  Video endoscope was inserted by direct vision.  The esophagus  proximal and mid was normal.  In the distal esophagus was an obvious  moderate-size hiatal hernia with obvious Barrett's only a few centimeters in  length which was extensively biopsied at the end of the procedure.  The  scope passed into the stomach and advanced through a normal antrum, normal  pylorus, into a normal duodenal bulb and around the C loop to a normal  second portion of the duodenum.  The scope was withdrawn back to the bulb,  and a good look there ruled out abnormalities in that location.  The scope  was withdrawn back to the stomach and retroflexed.  High in the cardia, the  hiatal hernia was confirmed.  Fundus, angularis, lesser and greater curve  were normal on retroflexed visualization.  Straight visualization of the  stomach revealed a probable tiny submucosal probable lipoma along the  greater curve which was cold biopsied at the end of the procedure and put in  a separate container.  Air was suctioned and the scope slowly withdrawn.  Again, a good look at the esophagus confirmed the above findings, and  biopsies of Barrett's were obtained at this time and put in a separate  container from the stomach biopsies.  Air was suctioned.  Scope removed.  The patient tolerated the procedure well.  There  was no obvious immediate  complication.   ENDOSCOPIC DIAGNOSES:  1.  Moderate hiatal hernia.  2.  Obvious Barrett's, without much change from before, status post biopsy.  3.  Questionable gastric polyp versus submucosal lesion, cold biopsied.  4.  Otherwise within normal limits esophagogastroduodenoscopy.   PLAN:  Await pathology.  B.i.d. pump inhibitors.  Follow up p.r.n. or in  three months.      MEM/MEDQ  D:  12/04/2004  T:  12/04/2004  Job:  [id number]   cc:   Petra Kuba, M.D.  [address]  [place]  [place] [address]  Fax: [phone]   [facility]

## 2011-01-23 ENCOUNTER — Ambulatory Visit: Payer: Self-pay | Admitting: Family Medicine

## 2011-03-26 ENCOUNTER — Ambulatory Visit (INDEPENDENT_AMBULATORY_CARE_PROVIDER_SITE_OTHER): Payer: Medicaid Other | Admitting: Family Medicine

## 2011-03-26 ENCOUNTER — Encounter: Payer: Self-pay | Admitting: Family Medicine

## 2011-03-26 ENCOUNTER — Other Ambulatory Visit: Payer: Self-pay

## 2011-03-26 ENCOUNTER — Ambulatory Visit (HOSPITAL_COMMUNITY)
Admission: RE | Admit: 2011-03-26 | Discharge: 2011-03-26 | Disposition: A | Discharge status: Home / Self Care | Payer: Medicaid Other | Source: Ambulatory Visit | Attending: Family Medicine | Admitting: Family Medicine

## 2011-03-26 VITALS — BP 135/73 | HR 72 | Temp 97.9°F | Wt 148.0 lb

## 2011-03-26 DIAGNOSIS — R079 Chest pain, unspecified: Secondary | ICD-10-CM

## 2011-03-26 DIAGNOSIS — E876 Hypokalemia: Secondary | ICD-10-CM

## 2011-03-26 DIAGNOSIS — R112 Nausea with vomiting, unspecified: Secondary | ICD-10-CM

## 2011-03-26 DIAGNOSIS — N400 Enlarged prostate without lower urinary tract symptoms: Secondary | ICD-10-CM | POA: Insufficient documentation

## 2011-03-26 DIAGNOSIS — K2270 Barrett's esophagus without dysplasia: Secondary | ICD-10-CM

## 2011-03-26 DIAGNOSIS — I10 Essential (primary) hypertension: Secondary | ICD-10-CM

## 2011-03-26 LAB — BASIC METABOLIC PANEL
Chloride: 100 mEq/L (ref 96–112)
Creat: 1.02 mg/dL (ref 0.50–1.35)
Potassium: 3.1 mEq/L — ABNORMAL LOW (ref 3.5–5.3)
Sodium: 141 mEq/L (ref 135–145)

## 2011-03-26 MED ORDER — OMEPRAZOLE 20 MG PO CPDR: 20.0000 mg | DELAYED_RELEASE_CAPSULE | Freq: Every day | ORAL | Status: DC

## 2011-03-26 MED ORDER — BENAZEPRIL-HYDROCHLOROTHIAZIDE 10-12.5 MG PO TABS: 2.0000 | ORAL_TABLET | Freq: Every day | ORAL | Status: DC

## 2011-03-26 MED ORDER — ATENOLOL 50 MG PO TABS: 50.0000 mg | ORAL_TABLET | Freq: Two times a day (BID) | ORAL | Status: DC

## 2011-03-26 MED ORDER — AMLODIPINE BESYLATE 5 MG PO TABS: 5.0000 mg | ORAL_TABLET | Freq: Every day | ORAL | Status: DC

## 2011-03-26 NOTE — Patient Instructions (Addendum)
Someone here will get in touch with you regarding your potassium results.  Please make an appointment to see me about your knees (FRONT OFFICE: MAY DOUBLE BOOK PATIENT).

## 2011-03-27 MED ORDER — POTASSIUM CHLORIDE ER 10 MEQ PO TBCR: 20.0000 meq | EXTENDED_RELEASE_TABLET | Freq: Every day | ORAL | Status: DC

## 2011-03-27 NOTE — Assessment & Plan Note (Signed)
I think nausea/vomiting and chest pain associated with Barrett's esophagus. He has not seen a specialist for a while. He will need to get re-established with one since he will need monitoring of progression of this disease (recommendation is EGD every 3-5 years). Will re-start PPI. Consider increasing to bid if symptoms not improved. ECG today shows NSR with no ST or TW abnormalities.  I do not feel his chest pain is cardiac in origin, but was given hand-out on red flags.

## 2011-03-27 NOTE — Progress Notes (Signed)
  Subjective:    Patient ID: Andrew Huerta, male    DOB: 02-26-1966, 46 y.o.   MRN: [id number]  HPI 1. Low potassium Here to re-check potassium, found to be low.  May be low to frequent vomiting episodes, although has not had for past week.   2. Nausea/vomiting Occurs regularly past 3 years.  Diagnosed with Barrett's esophagus. Has not taken Nexium past month due to inability to afford.  Now on Medicaid.   3. Chest pain Hurts with deep breaths. Lasts 5-9 hours.  Not exertional.  Nothing alleviates. Not associated with activity but sometimes happens after throwing-up. Not necessarily associated with meals.   Review of Systems Denies palpatations, diarrhea/constipation, swelling, difficulty breathing, blood in stool or sputum.     Objective:   Physical Exam General: NAD, a little disheveled Psych: not anxious/depressed appearing, appropriate to questions HEENT: MMM CV: RRR, no murmurs Pulm: CTAB Abd: decreased/NABS, soft, non-tender Ext: no edema Skin: warm, dry    Assessment & Plan:

## 2011-03-27 NOTE — Assessment & Plan Note (Deleted)
I think nausea/vomiting and chest pain associated with this. He has not seem a specialist for a while. He will need to get re-established with one since he will need monitoring of progression of this disease (recommendation is EGD every 3-5 years). Will re-start PPI. Consider increasing to bid if symptoms (nausea/vomiting/chest pain) not improved).

## 2011-03-27 NOTE — Assessment & Plan Note (Signed)
I think nausea/vomiting and chest pain associated with Barrett's esophagus. He has not seen a specialist for a while. He will need to get re-established with one since he will need monitoring of progression of this disease (recommendation is EGD every 3-5 years). Will re-start PPI. Consider increasing to bid if symptoms not improved.

## 2011-03-27 NOTE — Assessment & Plan Note (Signed)
Good control. Continue current medications.

## 2011-03-27 NOTE — Assessment & Plan Note (Signed)
Persistent. Asymptomatic. Has not taken potassium for several weeks. Still low today (3.1) but not as bad as before (when it was in the 2's). Called patient today (07/26). Will re-start potassium tablets but at a lower dose. Will re-check potassium when he comes to see me in 1 week for his knee problems.

## 2011-04-03 ENCOUNTER — Encounter: Payer: Self-pay | Admitting: Family Medicine

## 2011-04-03 ENCOUNTER — Ambulatory Visit (INDEPENDENT_AMBULATORY_CARE_PROVIDER_SITE_OTHER): Payer: Medicaid Other | Admitting: Family Medicine

## 2011-04-03 VITALS — BP 160/98 | HR 82 | Temp 98.3°F | Ht 67.0 in | Wt 148.8 lb

## 2011-04-03 DIAGNOSIS — M1710 Unilateral primary osteoarthritis, unspecified knee: Secondary | ICD-10-CM

## 2011-04-03 DIAGNOSIS — K2270 Barrett's esophagus without dysplasia: Secondary | ICD-10-CM

## 2011-04-03 DIAGNOSIS — I10 Essential (primary) hypertension: Secondary | ICD-10-CM

## 2011-04-03 DIAGNOSIS — E876 Hypokalemia: Secondary | ICD-10-CM

## 2011-04-03 LAB — BASIC METABOLIC PANEL
CO2: 34 mEq/L — ABNORMAL HIGH (ref 19–32)
Glucose, Bld: 117 mg/dL — ABNORMAL HIGH (ref 70–99)
Potassium: 3.1 mEq/L — ABNORMAL LOW (ref 3.5–5.3)
Sodium: 142 mEq/L (ref 135–145)

## 2011-04-03 MED ORDER — HYDROCODONE-ACETAMINOPHEN 5-500 MG PO TABS: 1.0000 | ORAL_TABLET | Freq: Two times a day (BID) | ORAL | Status: DC

## 2011-04-03 MED ORDER — ESOMEPRAZOLE MAGNESIUM 40 MG PO CPDR: 40.0000 mg | DELAYED_RELEASE_CAPSULE | Freq: Every day | ORAL | Status: DC

## 2011-04-03 NOTE — Progress Notes (Signed)
  Subjective:    Patient ID: Andrew Huerta, male    DOB: 11-23-1965, 45 y.o.   MRN: [id number]  HPI 1. Chronic bilateral knee pain Had been on Vicodin and percocet in the past, which helped.  Stopped due to lack of insurance.  Not taken for months now.   Has had 6 surgeries on right and 1 on left for this issue.  Hurt playing sports (football). Tylenol helps occasionally. NSAIDS irritate stomach. Joint injections don't help.   2. HypoK No more vomiting. No diarrhea. Taking potassium. ROS: denies chest palpitations, muscle twitching  Review of Systems    Objective:   Physical Exam General: NAD, somewhat disheveled appearing Knees: bony, thin, no obvious swelling, mild TTP, ROM intact    Assessment & Plan:

## 2011-04-03 NOTE — Assessment & Plan Note (Signed)
Checking potassium again today. Asymptomatic. Hopefully will be improved now no more vomiting (due to poorly controlled esophagitis/Barrett's esophagus).

## 2011-04-03 NOTE — Patient Instructions (Addendum)
Vicodin as needed and physical therapy for your knees. Tylenol 650mg  up to 3x a day.   If your lab results are normal, I will send you a letter with the results. If abnormal, someone at the clinic will get in touch with you.   Come back in 1 month to get your potassium check.

## 2011-04-03 NOTE — Assessment & Plan Note (Signed)
Due to severity of knee injuries, will give chronic, small dose Vicodin. Will need to fill out pain contract at next visit. Also recommending Tylenol for baseline pain.

## 2011-04-03 NOTE — Assessment & Plan Note (Signed)
BP elevated today. Last time 130s/70s. Yesterday at pharmacy, he reports it being 115/60. Will monitor for now.

## 2011-04-04 ENCOUNTER — Other Ambulatory Visit: Payer: Medicaid Other

## 2011-04-04 ENCOUNTER — Other Ambulatory Visit: Payer: Self-pay | Admitting: Family Medicine

## 2011-04-04 DIAGNOSIS — E876 Hypokalemia: Secondary | ICD-10-CM

## 2011-04-04 NOTE — Progress Notes (Signed)
mag level done today [facility] [name]

## 2011-04-04 NOTE — Telephone Encounter (Signed)
Discussed low potassium with patient. Unchanged from previous lab a week ago. Still 3.1. I will check Mg today. Asked him to come to lab for this test.   If low, will replete with oral magnesium since patient asymptomatic.  Continue potassium supplementation at current dose for now.

## 2011-04-05 LAB — MAGNESIUM: Magnesium: 1.8 mg/dL (ref 1.5–2.5)

## 2011-04-07 NOTE — Telephone Encounter (Signed)
Mag level 1.8. Low normal. Did not replete.

## 2011-04-08 ENCOUNTER — Telehealth: Payer: Self-pay | Admitting: Family Medicine

## 2011-04-08 NOTE — Telephone Encounter (Signed)
Mr. Benegas is calling to get the results from his labwork.

## 2011-04-09 NOTE — Telephone Encounter (Signed)
Pt is calling again for results - Andrew Huerta is out of office for 2 weeks. pls advise

## 2011-04-10 NOTE — Telephone Encounter (Signed)
Phone call completed. Pt instructed to continue potassium supplement. No need for magnesium supplementation at this time. Pt instructed to cal the clinic for f/u appt with Dr. Madolyn Frieze.

## 2011-04-21 ENCOUNTER — Encounter: Payer: Self-pay | Admitting: Family Medicine

## 2011-04-21 ENCOUNTER — Ambulatory Visit (INDEPENDENT_AMBULATORY_CARE_PROVIDER_SITE_OTHER): Payer: Medicaid Other | Admitting: Family Medicine

## 2011-04-21 VITALS — BP 183/88 | HR 71 | Temp 97.6°F | Wt 152.0 lb

## 2011-04-21 DIAGNOSIS — H00019 Hordeolum externum unspecified eye, unspecified eyelid: Secondary | ICD-10-CM

## 2011-04-21 DIAGNOSIS — H5789 Other specified disorders of eye and adnexa: Secondary | ICD-10-CM

## 2011-04-21 NOTE — Progress Notes (Signed)
  Subjective:    Patient ID: Andrew Huerta, male    DOB: 07-Oct-1965, 45 y.o.   MRN: [id number]  HPI  EYE COMPLAINT  Location: right  Onset: 2 days ago Better with: nothing but not worsening  Symptoms Discharge: no Pain: yes, mild more when touches it, not with eye movement Photophobia: no Decreased Vision: no URI symptoms: no Itching/Allergy sxs: no Glaucoma: no Recent eye surgery: no Contact lens use: no  Red Flags Trauma: no Foreign Body: no Vomiting/HA: no Halos around lights: no Chickenpox or zoster: no   Review of Symptoms - see HPI  PMH - Smoking status noted.     Review of Systems     Objective:   Physical Exam  Eyes:         Both eyes PERRLA, EOMI without pain.  No discharge from R eye. Periocular skin is minimally tender to touch.  Lower lid has stye pointing inward over the lateral area.          Assessment & Plan:

## 2011-04-21 NOTE — Assessment & Plan Note (Signed)
Acute. No systemic symptoms.   Treat with compresses

## 2011-04-21 NOTE — Patient Instructions (Signed)
You have a stye in your right eye  Use warm compresses (wash cloth) for 10-20 minutes at least 4 times a day.  It should be better in 3-4 days.   If it is spreading or you have fever the call us.  If not gone in 10 days come back  Come in for a blood pressure visit in the next few weeks.  Take your blood pressure and write down the readings.  Bring all your medication bottles to your visit

## 2011-04-25 ENCOUNTER — Telehealth: Payer: Self-pay | Admitting: Family Medicine

## 2011-04-25 DIAGNOSIS — E876 Hypokalemia: Secondary | ICD-10-CM

## 2011-04-25 NOTE — Telephone Encounter (Signed)
Will you schedule a lab visit and please ask patient to come in to lab a few days before his next appointment with me on 09/04? I wanted to check his potassium level before his visit so we can discuss the plan when he is here.  Thank you Erin.

## 2011-05-01 ENCOUNTER — Other Ambulatory Visit: Payer: Medicaid Other

## 2011-05-01 DIAGNOSIS — E876 Hypokalemia: Secondary | ICD-10-CM

## 2011-05-01 LAB — BASIC METABOLIC PANEL
CO2: 31 mEq/L (ref 19–32)
Chloride: 102 mEq/L (ref 96–112)
Creat: 0.76 mg/dL (ref 0.50–1.35)

## 2011-05-01 NOTE — Progress Notes (Signed)
BMP DONE TODAY MARCI HOLDER 

## 2011-05-06 ENCOUNTER — Encounter: Payer: Self-pay | Admitting: Family Medicine

## 2011-05-06 ENCOUNTER — Ambulatory Visit (INDEPENDENT_AMBULATORY_CARE_PROVIDER_SITE_OTHER): Payer: Medicaid Other | Admitting: Family Medicine

## 2011-05-06 DIAGNOSIS — M1710 Unilateral primary osteoarthritis, unspecified knee: Secondary | ICD-10-CM

## 2011-05-06 DIAGNOSIS — E876 Hypokalemia: Secondary | ICD-10-CM

## 2011-05-06 DIAGNOSIS — I10 Essential (primary) hypertension: Secondary | ICD-10-CM

## 2011-05-06 DIAGNOSIS — R079 Chest pain, unspecified: Secondary | ICD-10-CM

## 2011-05-06 MED ORDER — HYDROCODONE-ACETAMINOPHEN 5-500 MG PO TABS: 1.0000 | ORAL_TABLET | Freq: Two times a day (BID) | ORAL | Status: DC

## 2011-05-06 MED ORDER — EPLERENONE 25 MG PO TABS: 25.0000 mg | ORAL_TABLET | Freq: Every day | ORAL | Status: DC

## 2011-05-06 NOTE — Assessment & Plan Note (Signed)
BP improved today although past few visits has been 160-180s/80-90s. See A/P under "Hypokalemia".

## 2011-05-06 NOTE — Progress Notes (Signed)
  Subjective:    Patient ID: Andrew Huerta, male    DOB: 07-Sep-1965, 45 y.o.   MRN: [id number]  HPI 1. Hypokalemia and HTN Still hypokalemic despite supplements. Denies vomiting/diarrhea, laxative use.  Compliant with supplements and blood pressure medications.  Also reports compliance with blood pressure medications.  Other ROS: denies muscle aches/cramping, headaches, edema  2. Chest pain Chronic  Has been going on for years Substernal, feels like somebody hit him and feels sore, lasts for about 5 minutes Intermittent, doesn't radiate, not associated with activity, sometimes associated with SOB NTG used in the distant past did not help. Not taking NTG recently.   3. Knot near left knee Noticed recently Sometimes tender Denies recent swelling Denies fever, erythema, recent injury to that area   OA unchanged.  40 tablets a month is not enough. Requiring 2 pills daily, not just prn.  Review of Systems     Objective:   Physical Exam Gen: NAD Neuro: 2-3+ brachial and patellar reflexes; no asterixis CV: RRR, no m/r/g Pulm: CTAB, no w/r/r MSK: left lateral knee with bony protrusion, more pronounced than on right knee; mild TTP on that side but without any erythema, edema Psych: slightly anxious appearing (baseline), but alert and oriented and behavior/though content/judgment normal   Assessment & Plan:

## 2011-05-06 NOTE — Assessment & Plan Note (Addendum)
Persistent, unchanged. Unlikely Cardiac. Probably from Barrett's. Consider increasing PPI dosage.

## 2011-05-06 NOTE — Patient Instructions (Signed)
Take your fluid pills in the morning. I am starting you on a new fluid pill called eplerenone to help your blood pressure and potassium.   If your lab results are normal, I will send you a letter with the results. If abnormal, someone at the clinic will get in touch with you.   Follow-up in 1 month regarding your blood pressure and potassium. Please make a lab visits for 3-5 days before then.

## 2011-05-06 NOTE — Assessment & Plan Note (Addendum)
Persistent despite potassium supplements an continues to be asymptomatic. I do not think this can be just attributed to his being on HCTZ especially since he is also on lisinopril and taking supplements. Wanted to check renal ultrasound with Duplex to evaluate for renal artery stenosis but told by Radiology that this is not a good test and CTA and MRA recommended instead to evaluate. Since I also wanted to evaluate him for possible hyperaldosteronism due to his hypertension and hypokalemia and may need CT imaging on his adrenals if blood work is inconclusive, I will hold off on the CTA and MRI of the renal arteries until after the blood work for possible hyperaldosteronism. If he needs further work-up with CT of his adrenals, I will get that and a CTA of his renal arteries at that time.  Also adding eplerenone for better blood pressure control and to see if this helps hypokalemia.  Follow-up in 1 month.   Update (09/17): normal renin/aldosterone. Since patient on an ACEi with normal Cr, RAS less likely. Will hold off on getting imaging done of kidneys at this time. If Cr starts to worsen, consider CT/MRI or referring to outside hospital for renal duplex. He is following-up in early October 2012. Will re-check labs on eplerenone and will also check TSH at that time.  Called and discussed plan with patient.

## 2011-05-06 NOTE — Assessment & Plan Note (Addendum)
Persistent, unchanged. Will allow him to get 1 tablet bid and increase number of monthly pills from 40 to 60. Also, think the "knot" in his knee just bony protrusion with pain from this chronic issue. No worrisome findings on physical exam. Will need to sign pain contract next visit in 1 month.

## 2011-05-13 ENCOUNTER — Telehealth: Payer: Self-pay | Admitting: Family Medicine

## 2011-05-13 NOTE — Telephone Encounter (Signed)
Is asking about renal US he is supposed to have.

## 2011-05-13 NOTE — Telephone Encounter (Signed)
Only see a cancelled order, will send to MD Fleeger, Maryjo Rochester

## 2011-05-14 ENCOUNTER — Telehealth: Payer: Self-pay | Admitting: Family Medicine

## 2011-05-14 NOTE — Telephone Encounter (Signed)
Is asking when his [place] is scheduled?

## 2011-05-15 LAB — ALDOSTERONE + RENIN ACTIVITY W/ RATIO
ALDO / PRA Ratio: 16.8 Ratio (ref 0.9–28.9)
Aldosterone: 22 ng/dL

## 2011-05-15 NOTE — Telephone Encounter (Signed)
Pt informed. Tonita Bills Dawn  

## 2011-05-15 NOTE — Telephone Encounter (Signed)
See previous phone note. Andrew Huerta Dawn  

## 2011-05-15 NOTE — Telephone Encounter (Signed)
Jess, will you call him and let him know I cancelled this for now. Am waiting for his blood tests to come back first. Thanks. Will call him with results when they're in (they're not back yet).

## 2011-05-16 ENCOUNTER — Telehealth: Payer: Self-pay | Admitting: Family Medicine

## 2011-05-16 NOTE — Telephone Encounter (Signed)
To MD for referral. Andrew Huerta  

## 2011-05-16 NOTE — Telephone Encounter (Signed)
Discussed with patient that none of his medical conditions qualifies him for opthalmology referral. Asked him to see optometrist.

## 2011-05-16 NOTE — Telephone Encounter (Signed)
Pt needs an eye exam and his glasses broke and needs to be referred Not sure which eye doctor to go to.  [facility] is the one medicaid told him they would pay a percentage

## 2011-05-19 ENCOUNTER — Other Ambulatory Visit: Payer: Self-pay | Admitting: Family Medicine

## 2011-05-19 DIAGNOSIS — E876 Hypokalemia: Secondary | ICD-10-CM

## 2011-05-26 ENCOUNTER — Telehealth: Payer: Self-pay | Admitting: Family Medicine

## 2011-05-26 NOTE — Telephone Encounter (Signed)
Patient was bitten on the arm by a dog today around 11 am.  There is one puncture wound.  He says the area is slightly red and swollen.  He states that he is up to date on his tetanus.  Advised him to come in tomorrow am anyway to get another tetanus and to have the work in doctor look at the wound just to make sure it is not infected.   He has an appointment with Dr. Madolyn Frieze next Tuesday for follow up on his hypokalemia and was wondering if he could go ahead and have blood work drawn tomorrow.  Told him I would route this note to Dr. Madolyn Frieze and if she agrees she can go order the labs before he comes in.

## 2011-05-26 NOTE — Telephone Encounter (Signed)
Was bitten by a dog (? Pitt) today on his right arm.  It did break the skin.  He isn't sure if he needs to be seen and would like to talk to Dr. Madolyn Frieze.  He is scheduled to come in next Tuesday 10/2.

## 2011-05-26 NOTE — Telephone Encounter (Signed)
Orders for TSH and BMET already in. Thank you Olegario Messier for advising him.

## 2011-05-27 ENCOUNTER — Ambulatory Visit (INDEPENDENT_AMBULATORY_CARE_PROVIDER_SITE_OTHER): Payer: Medicaid Other | Admitting: Family Medicine

## 2011-05-27 DIAGNOSIS — S41109A Unspecified open wound of unspecified upper arm, initial encounter: Secondary | ICD-10-CM

## 2011-05-27 DIAGNOSIS — S41159A Open bite of unspecified upper arm, initial encounter: Secondary | ICD-10-CM

## 2011-05-27 DIAGNOSIS — E876 Hypokalemia: Secondary | ICD-10-CM

## 2011-05-27 DIAGNOSIS — T148XXA Other injury of unspecified body region, initial encounter: Secondary | ICD-10-CM

## 2011-05-27 DIAGNOSIS — W540XXA Bitten by dog, initial encounter: Secondary | ICD-10-CM

## 2011-05-27 LAB — BASIC METABOLIC PANEL
Glucose, Bld: 97 mg/dL (ref 70–99)
Potassium: 3.5 mEq/L (ref 3.5–5.3)
Sodium: 141 mEq/L (ref 135–145)

## 2011-05-27 MED ORDER — AMOXICILLIN-POT CLAVULANATE 875-125 MG PO TABS: 1.0000 | ORAL_TABLET | Freq: Two times a day (BID) | ORAL | Status: DC

## 2011-05-27 NOTE — Progress Notes (Signed)
  Subjective:    Patient ID: Andrew Huerta, male    DOB: 02-07-1966, 45 y.o.   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 While he was doing this he had close to a fence where there was a pitbull pitbull jumped offense and did bite patient on the right shoulder causing a puncture wound. Patient did call the cops the doctors been quarantined at this point is unregistered and has had no shots patient is worried about rabies. Patient denies any fevers chills nausea vomiting diarrhea or abdominal pain patient states he only needed a Band-Aid for the puncture wound and it stopped leading after approximately 5 minutes. He has not noticed any streaking or redness in the area just is a little sore from the bruising. Patient able to use his right arm without any problems   Review of Systems Denies fever, chills, nausea vomiting abdominal pain, dysuria, chest pain, shortness of breath dyspnea on exertion or numbness in extremities Past medical history, social, surgical and family history all reviewed.      Objective:   Physical Exam Vitals reviewed General: No apparent distress alert and oriented x3 Patient's right shoulder: He shouldn't has one area that likely was a mild puncture it's healed at this time. No redness no erythema patient does have some mild bruising surrounding the area. No streaking patient is minimally tender to palpation. Patient is neurovascularly intact distally and has full range of motion    Assessment & Plan:

## 2011-05-27 NOTE — Patient Instructions (Signed)
I do not think you are at high likelihood of rabies. Followup with what they find with the dog who is now in quarantine If you want you can always go to the health department to deal with this more frequently Otherwise only take the antibiotic if you start to see if redness in the arm Followup as needed.

## 2011-05-27 NOTE — Assessment & Plan Note (Signed)
Right-sided bite on the shoulder with patient mowing the lawn in the area I would not consider this an unprovoked attack. The dog has been quarantined for the next 10 days and will be watched. If it shows any signs of rabies the health Department is supposed to get 2 patient to tell him. Gave patient the choice of going to the health department in case he would like more input. At this time gave prescription for Augmentin as a wait-and-see measure for her shoulder. Did try labs as stated by Dr. Madolyn Frieze Will followup with Madolyn Frieze on 4 October

## 2011-06-03 ENCOUNTER — Ambulatory Visit (INDEPENDENT_AMBULATORY_CARE_PROVIDER_SITE_OTHER): Payer: Medicaid Other | Admitting: Family Medicine

## 2011-06-03 ENCOUNTER — Encounter: Payer: Self-pay | Admitting: Family Medicine

## 2011-06-03 DIAGNOSIS — M1710 Unilateral primary osteoarthritis, unspecified knee: Secondary | ICD-10-CM

## 2011-06-03 DIAGNOSIS — E876 Hypokalemia: Secondary | ICD-10-CM

## 2011-06-03 DIAGNOSIS — I10 Essential (primary) hypertension: Secondary | ICD-10-CM

## 2011-06-03 DIAGNOSIS — F411 Generalized anxiety disorder: Secondary | ICD-10-CM

## 2011-06-03 MED ORDER — POTASSIUM CHLORIDE ER 10 MEQ PO TBCR: 20.0000 meq | EXTENDED_RELEASE_TABLET | Freq: Every day | ORAL | Status: DC

## 2011-06-03 MED ORDER — BENAZEPRIL-HYDROCHLOROTHIAZIDE 20-25 MG PO TABS: 1.0000 | ORAL_TABLET | Freq: Every day | ORAL | Status: DC

## 2011-06-03 MED ORDER — HYDROCODONE-ACETAMINOPHEN 5-500 MG PO TABS: 1.0000 | ORAL_TABLET | Freq: Three times a day (TID) | ORAL | Status: DC | PRN

## 2011-06-03 MED ORDER — AMLODIPINE BESYLATE 10 MG PO TABS: 10.0000 mg | ORAL_TABLET | Freq: Every day | ORAL | Status: DC

## 2011-06-03 MED ORDER — HYDROCODONE-ACETAMINOPHEN 5-500 MG PO TABS: 1.0000 | ORAL_TABLET | Freq: Two times a day (BID) | ORAL | Status: DC | PRN

## 2011-06-03 MED ORDER — NORTRIPTYLINE HCL 25 MG PO CAPS: 25.0000 mg | ORAL_CAPSULE | Freq: Every day | ORAL | Status: DC

## 2011-06-03 NOTE — Patient Instructions (Signed)
It was nice to see you and your girlfriend.  Increase Norvasc (amlodipine) to 10mg  a day.   Vicodin should only be used for your knees usually. I am giving you 45 tablets to last you for 1 month.  We are starting a medication called nortriptyline to see if this helps with your pain and allows you to sleep better. Take it before bedtime.   Come in 3 days before your next visit for a lab visit.  Follow-up with me in 1 month to talk about your pain and blood pressure.

## 2011-06-03 NOTE — Assessment & Plan Note (Signed)
Persistent. Takes Vicodin chronically. Will continue for now. Said he may take up to 3 a day if the pain is particularly bad from the weather. Discussed starting long-acting narcotic at next visit. Still needs to sign pain contract.   Encouraged him to continue to do those leg strengthening exercises he learned at PT a while ago.  Pain does not seem to interfere with this life that much except makes it difficult to sleep. Starting nortriptyline today to see if it helps. Consider starting Requip if this does not help.  Discussed possible psychology referral to help deal with chronic pain. Even though he does not seem depressed, this may help. He is amenable to idea. Will discuss more at next visit in 1 month.

## 2011-06-03 NOTE — Assessment & Plan Note (Signed)
Labs normal finally after starting eplerenone. Continue this and supplements. Low potassium may be due to HCTZ. Has always been asymptomatic from low potassium. Will re-check in 1 month.

## 2011-06-03 NOTE — Assessment & Plan Note (Signed)
Still elevated. Increasing Norvasc to 10mg  today. Continue other medications. Consider increasing eplerenone at next visit in 1 month if still above 150 systolics.

## 2011-06-03 NOTE — Progress Notes (Signed)
  Subjective:    Patient ID: Andrew Huerta, male    DOB: 02-04-1966, 45 y.o.   MRN: [id number]  HPI This is a 45 YO Caucasian male w h/o HTN that has been difficult to control, significant hypokalemia but with intact kidney function, severe osteoarthritis of both knees on chronic narcotics. Here for follow-up for blood pressure and potassium  K now finally 3.5.  ROS: denies muscle aches, chest pain  Blood pressure still high today despite compliance with medications. ROS: denies headache, chest pain, heart palpitations, leg swelling  Now taking 2-3 Vicodin a day. Pain in knees worse with weather changes and rainy weather.  Also taking Vicodin for rib pain. Thinks "cracked" ribs. Pain does not interfere with activities but does make it difficult to sleep. Moves legs constantly. Denies depression.   Review of Systems Per HPI    Objective:   Physical Exam Gen: NAD, accompanied by girlfriend today. Psych: mildly anxious appearing (this is usual for him), not depressed appearing, appropriate to questions CV: RRR, no m/r/g; no JVD Abd: soft, NT Pulm: CTAB, no rales Ext: no edema, cyanosis Knees: no swelling; mild TTP palpation of medial knees, normal ROM, no erythema Neuro: grossly intact; gait normal    Assessment & Plan:

## 2011-06-04 LAB — POCT I-STAT, CHEM 8
Calcium, Ion: 1.12
HCT: 44
Hemoglobin: 15
Sodium: 139
TCO2: 32

## 2011-06-04 LAB — POCT CARDIAC MARKERS: Myoglobin, poc: 90.8

## 2011-07-03 ENCOUNTER — Other Ambulatory Visit: Payer: Medicaid Other

## 2011-07-03 DIAGNOSIS — E876 Hypokalemia: Secondary | ICD-10-CM

## 2011-07-03 LAB — BASIC METABOLIC PANEL
CO2: 30 mEq/L (ref 19–32)
Chloride: 101 mEq/L (ref 96–112)
Glucose, Bld: 119 mg/dL — ABNORMAL HIGH (ref 70–99)
Potassium: 3.8 mEq/L (ref 3.5–5.3)
Sodium: 144 mEq/L (ref 135–145)

## 2011-07-03 NOTE — Progress Notes (Signed)
BMP DONE TODAY Jerney Baksh 

## 2011-07-08 ENCOUNTER — Ambulatory Visit (INDEPENDENT_AMBULATORY_CARE_PROVIDER_SITE_OTHER): Payer: Medicaid Other | Admitting: Family Medicine

## 2011-07-08 DIAGNOSIS — M1710 Unilateral primary osteoarthritis, unspecified knee: Secondary | ICD-10-CM

## 2011-07-08 DIAGNOSIS — I10 Essential (primary) hypertension: Secondary | ICD-10-CM

## 2011-07-08 DIAGNOSIS — IMO0002 Reserved for concepts with insufficient information to code with codable children: Secondary | ICD-10-CM

## 2011-07-08 DIAGNOSIS — E876 Hypokalemia: Secondary | ICD-10-CM

## 2011-07-08 DIAGNOSIS — K2270 Barrett's esophagus without dysplasia: Secondary | ICD-10-CM

## 2011-07-08 DIAGNOSIS — R079 Chest pain, unspecified: Secondary | ICD-10-CM

## 2011-07-08 MED ORDER — EPLERENONE 50 MG PO TABS: 50.0000 mg | ORAL_TABLET | Freq: Every day | ORAL | Status: DC

## 2011-07-08 MED ORDER — NITROGLYCERIN 0.4 MG SL SUBL: 0.4000 mg | SUBLINGUAL_TABLET | SUBLINGUAL | Status: DC | PRN

## 2011-07-08 MED ORDER — ESOMEPRAZOLE MAGNESIUM 40 MG PO CPDR: 40.0000 mg | DELAYED_RELEASE_CAPSULE | Freq: Two times a day (BID) | ORAL | Status: DC

## 2011-07-08 MED ORDER — HYDROCODONE-ACETAMINOPHEN 5-500 MG PO TABS: 1.0000 | ORAL_TABLET | Freq: Three times a day (TID) | ORAL | Status: DC | PRN

## 2011-07-08 MED ORDER — ATENOLOL 50 MG PO TABS: 50.0000 mg | ORAL_TABLET | Freq: Two times a day (BID) | ORAL | Status: DC

## 2011-07-08 MED ORDER — BENAZEPRIL-HYDROCHLOROTHIAZIDE 20-25 MG PO TABS: 1.0000 | ORAL_TABLET | Freq: Every day | ORAL | Status: DC

## 2011-07-08 MED ORDER — POTASSIUM CHLORIDE ER 10 MEQ PO TBCR: 20.0000 meq | EXTENDED_RELEASE_TABLET | Freq: Every day | ORAL | Status: DC

## 2011-07-08 MED ORDER — AMLODIPINE BESYLATE 10 MG PO TABS: 10.0000 mg | ORAL_TABLET | Freq: Every day | ORAL | Status: DC

## 2011-07-08 NOTE — Assessment & Plan Note (Signed)
Resolved with eplerenone and potassium supplements. Increasing eplerenone today for better BP control.  Will re-check K in 1 month at follow-up.

## 2011-07-08 NOTE — Assessment & Plan Note (Signed)
Persistently high. Still 160s/90s and this is where it seems to be for the patient usually (checks it at [facility] about 10 times a month).  Will increase eplerenone. Continue other medications.

## 2011-07-08 NOTE — Patient Instructions (Signed)
For your chest pain, I think this may be due to your Barrett's most likely.  Increase your Nexium to twice a day.   But it is possible it may be due to your heart.  If you get the chest pain, try the nitroglycerin. If it helps your chest pain, call the clinic and let me know. We will refer you to a cardiologist at that time.   For your blood pressure, increase the eplerenone to 50 mg once a day. Continue to take your other blood pressure medications.  Please follow-up with me in 1 month regarding your blood pressure and chest pain.  Please make a lab visit a few days before so we can discuss your labs at our visit.  Please follow-up with me regarding your arthritis in 3 months.

## 2011-07-08 NOTE — Progress Notes (Signed)
  Subjective:    Patient ID: Andrew Huerta, male    DOB: 05-14-1966, 45 y.o.   MRN: [id number]  HPI 1. Hypokalemia Compliant with eplerenone and potassium supplements. K now WNL.  2. HTN Still elevated Measured BP at [employer] about 10 times a month. Usually BP are 160s/90s. ROS: has chest pain all the time for the past several years, left-sided, sometimes radiates down left-arm but this may be due to his carpal tunnel. Patient not sure if left arm pain is due to chest pain or carpal tunnel. Not associated with dyspnea or nausea. Lasts for about 5 minutes. Occurs about once a day. Moderate or severe in severity. No chest pain currently. Does not know what makes it better or worse. Not associated with eating. Denies headache.  3. Bilateral knee arthritis Taking Vicodin about 2-3 tablets daily.  Cannot take NSAIDs because caused stomach ulcers in the past and upsets his stomach.  ROS: denies fevers  Review of Systems Per HPI.    Objective:   Physical Exam Gen: NAD Psych: anxious-appearing CV: RRR, no murmurs/rubs, gallops, normal S1/S2 Neck: no carotid bruits Pulm: CTAB, no rales  Ext: no swelling  Knees: thin, no swelling/erythema/warmth    Assessment & Plan:

## 2011-07-08 NOTE — Assessment & Plan Note (Signed)
Unchanged. Will give Rx for same number of Vicodin (65 for a month).

## 2011-07-08 NOTE — Assessment & Plan Note (Addendum)
Has been complaining of chest pain for a while now. His last stress test was about 5 years ago. He does not remember what the results of the stress test were but since no intervention was done, I assume it was okay.  I wrote previously that I thought this chest pain was due to his Barrett's and not cardiac in origin. I still think this is the case. I will increase his Nexium today to see if it helps the chest pain. If the chest pain is not improved, then I think he would benefit from a repeat stress test, although his ECG at his last visit was normal. His main risk factor for cardiac disease is his refractory hypertension. He is a non-smoker, non-diabetic, thin. Will check his LDL today and do a FLP at his next visit.  If LDL significantly elevated, will refer to Cardiology. If not, then will check FLP at next visit and hold on referral for now.  Will give NTG to see if it helps chest pain. If it does, will refer to Cardiology.

## 2011-08-08 ENCOUNTER — Ambulatory Visit: Payer: Medicaid Other | Admitting: Family Medicine

## 2011-08-08 ENCOUNTER — Other Ambulatory Visit: Payer: Self-pay | Admitting: Family Medicine

## 2011-08-08 MED ORDER — OMEPRAZOLE 40 MG PO CPDR: 40.0000 mg | DELAYED_RELEASE_CAPSULE | Freq: Every day | ORAL | Status: DC

## 2011-08-13 ENCOUNTER — Telehealth: Payer: Self-pay | Admitting: *Deleted

## 2011-08-13 NOTE — Telephone Encounter (Signed)
PA required for Nexium. Form placed in MD box. 

## 2011-08-14 ENCOUNTER — Ambulatory Visit (INDEPENDENT_AMBULATORY_CARE_PROVIDER_SITE_OTHER): Payer: Medicaid Other | Admitting: *Deleted

## 2011-08-14 ENCOUNTER — Ambulatory Visit (INDEPENDENT_AMBULATORY_CARE_PROVIDER_SITE_OTHER): Payer: Medicaid Other | Admitting: Family Medicine

## 2011-08-14 ENCOUNTER — Encounter (HOSPITAL_COMMUNITY): Payer: Self-pay | Admitting: Cardiology

## 2011-08-14 ENCOUNTER — Emergency Department (HOSPITAL_COMMUNITY): Payer: Medicaid Other

## 2011-08-14 ENCOUNTER — Inpatient Hospital Stay (HOSPITAL_COMMUNITY)
Admission: EM | Admit: 2011-08-14 | Discharge: 2011-08-16 | DRG: 313 | Disposition: A | Discharge status: Home / Self Care | Payer: Medicaid Other | Attending: Family Medicine | Admitting: Family Medicine

## 2011-08-14 DIAGNOSIS — R112 Nausea with vomiting, unspecified: Secondary | ICD-10-CM | POA: Diagnosis present

## 2011-08-14 DIAGNOSIS — E876 Hypokalemia: Secondary | ICD-10-CM | POA: Diagnosis present

## 2011-08-14 DIAGNOSIS — I10 Essential (primary) hypertension: Secondary | ICD-10-CM

## 2011-08-14 DIAGNOSIS — K449 Diaphragmatic hernia without obstruction or gangrene: Secondary | ICD-10-CM | POA: Diagnosis present

## 2011-08-14 DIAGNOSIS — R0789 Other chest pain: Principal | ICD-10-CM | POA: Diagnosis present

## 2011-08-14 DIAGNOSIS — M79609 Pain in unspecified limb: Secondary | ICD-10-CM

## 2011-08-14 DIAGNOSIS — F411 Generalized anxiety disorder: Secondary | ICD-10-CM | POA: Diagnosis present

## 2011-08-14 DIAGNOSIS — M545 Low back pain: Secondary | ICD-10-CM

## 2011-08-14 DIAGNOSIS — I129 Hypertensive chronic kidney disease with stage 1 through stage 4 chronic kidney disease, or unspecified chronic kidney disease: Secondary | ICD-10-CM | POA: Diagnosis present

## 2011-08-14 DIAGNOSIS — R079 Chest pain, unspecified: Secondary | ICD-10-CM

## 2011-08-14 DIAGNOSIS — K2270 Barrett's esophagus without dysplasia: Secondary | ICD-10-CM

## 2011-08-14 DIAGNOSIS — K22711 Barrett's esophagus with high grade dysplasia: Secondary | ICD-10-CM

## 2011-08-14 DIAGNOSIS — K219 Gastro-esophageal reflux disease without esophagitis: Secondary | ICD-10-CM | POA: Diagnosis present

## 2011-08-14 DIAGNOSIS — E785 Hyperlipidemia, unspecified: Secondary | ICD-10-CM | POA: Diagnosis present

## 2011-08-14 DIAGNOSIS — N189 Chronic kidney disease, unspecified: Secondary | ICD-10-CM | POA: Diagnosis present

## 2011-08-14 DIAGNOSIS — F329 Major depressive disorder, single episode, unspecified: Secondary | ICD-10-CM | POA: Diagnosis present

## 2011-08-14 DIAGNOSIS — M1990 Unspecified osteoarthritis, unspecified site: Secondary | ICD-10-CM | POA: Diagnosis present

## 2011-08-14 DIAGNOSIS — F3289 Other specified depressive episodes: Secondary | ICD-10-CM | POA: Diagnosis present

## 2011-08-14 HISTORY — DX: Chest pain, unspecified: R07.9

## 2011-08-14 HISTORY — DX: Gastro-esophageal reflux disease without esophagitis: K21.9

## 2011-08-14 HISTORY — DX: Depression, unspecified: F32.A

## 2011-08-14 HISTORY — DX: Diaphragmatic hernia without obstruction or gangrene: K44.9

## 2011-08-14 HISTORY — DX: Sleep apnea, unspecified: G47.30

## 2011-08-14 HISTORY — DX: Anxiety disorder, unspecified: F41.9

## 2011-08-14 HISTORY — DX: Low back pain, unspecified: M54.50

## 2011-08-14 HISTORY — DX: Major depressive disorder, single episode, unspecified: F32.9

## 2011-08-14 HISTORY — DX: Low back pain: M54.5

## 2011-08-14 LAB — CBC
HCT: 39.9 % (ref 39.0–52.0)
HCT: 39.7 % (ref 39.0–52.0)
Hemoglobin: 14.3 g/dL (ref 13.0–17.0)
MCHC: 35.8 g/dL (ref 30.0–36.0)
MCHC: 36 g/dL (ref 30.0–36.0)
MCV: 86.5 fL (ref 78.0–100.0)
Platelets: 203 10*3/uL (ref 150–400)
RBC: 4.64 MIL/uL (ref 4.22–5.81)
RDW: 12.6 % (ref 11.5–15.5)
WBC: 11.9 10*3/uL — ABNORMAL HIGH (ref 4.0–10.5)

## 2011-08-14 LAB — POCT I-STAT TROPONIN I

## 2011-08-14 LAB — BASIC METABOLIC PANEL
BUN: 11 mg/dL (ref 6–23)
BUN: 11 mg/dL (ref 6–23)
CO2: 29 mEq/L (ref 19–32)
Calcium: 9.1 mg/dL (ref 8.4–10.5)
Chloride: 100 mEq/L (ref 96–112)
Chloride: 100 mEq/L (ref 96–112)
Creatinine, Ser: 0.83 mg/dL (ref 0.50–1.35)
GFR calc Af Amer: >90 mL/min (ref >90)
Glucose, Bld: 102 mg/dL — ABNORMAL HIGH (ref 70–99)
Glucose, Bld: 99 mg/dL (ref 70–99)
Potassium: 2.9 mEq/L — ABNORMAL LOW (ref 3.5–5.1)
Sodium: 140 mEq/L (ref 135–145)

## 2011-08-14 LAB — CARDIAC PANEL(CRET KIN+CKTOT+MB+TROPI)
CK, MB: 1.9 ng/mL (ref 0.3–4.0)
Relative Index: INVALID (ref 0.0–2.5)
Total CK: 48 U/L (ref 7–232)

## 2011-08-14 LAB — CREATININE, SERUM: GFR calc Af Amer: >90 mL/min (ref >90)

## 2011-08-14 MED ORDER — ONDANSETRON HCL 4 MG PO TABS: 4.0000 mg | ORAL_TABLET | Freq: Four times a day (QID) | ORAL | Status: DC | PRN

## 2011-08-14 MED ORDER — GI COCKTAIL ~~LOC~~
30.0000 mL | Freq: Two times a day (BID) | ORAL | Status: DC | PRN
Start: 2011-08-14 — End: 2011-08-16
  Administered 2011-08-14: 30 mL via ORAL
  Filled 2011-08-14 (×2): qty 30

## 2011-08-14 MED ORDER — HEPARIN SODIUM (PORCINE) 5000 UNIT/ML IJ SOLN
5000.0000 [IU] | Freq: Three times a day (TID) | INTRAMUSCULAR | Status: DC
  Administered 2011-08-14 – 2011-08-16 (×6): 5000 [IU] via SUBCUTANEOUS
  Filled 2011-08-14 (×8): qty 1

## 2011-08-14 MED ORDER — ONDANSETRON HCL 4 MG/2ML IJ SOLN: 4.0000 mg | Freq: Four times a day (QID) | INTRAMUSCULAR | Status: DC | PRN

## 2011-08-14 MED ORDER — POTASSIUM CHLORIDE 10 MEQ PO TBCR
40.0000 meq | EXTENDED_RELEASE_TABLET | Freq: Every day | ORAL | Status: DC
  Filled 2011-08-14: qty 4

## 2011-08-14 MED ORDER — ATENOLOL 50 MG PO TABS
50.0000 mg | ORAL_TABLET | Freq: Two times a day (BID) | ORAL | Status: DC
  Administered 2011-08-14 – 2011-08-15 (×2): 50 mg via ORAL
  Filled 2011-08-14 (×3): qty 1

## 2011-08-14 MED ORDER — SODIUM CHLORIDE 0.9 % IV SOLN
INTRAVENOUS | Status: DC
  Administered 2011-08-14: 12:00:00 via INTRAVENOUS

## 2011-08-14 MED ORDER — BENAZEPRIL HCL 20 MG PO TABS
20.0000 mg | ORAL_TABLET | Freq: Every day | ORAL | Status: DC
  Administered 2011-08-14 – 2011-08-15 (×2): 20 mg via ORAL
  Filled 2011-08-14 (×2): qty 1

## 2011-08-14 MED ORDER — BENAZEPRIL-HYDROCHLOROTHIAZIDE 20-25 MG PO TABS: 1.0000 | ORAL_TABLET | Freq: Every day | ORAL | Status: DC

## 2011-08-14 MED ORDER — MORPHINE SULFATE 4 MG/ML IJ SOLN
6.0000 mg | Freq: Once | INTRAMUSCULAR | Status: AC
  Administered 2011-08-14: 6 mg via INTRAVENOUS
  Filled 2011-08-14: qty 2

## 2011-08-14 MED ORDER — POTASSIUM CHLORIDE CRYS ER 20 MEQ PO TBCR
40.0000 meq | EXTENDED_RELEASE_TABLET | Freq: Once | ORAL | Status: AC
  Administered 2011-08-14: 40 meq via ORAL
  Filled 2011-08-14: qty 2

## 2011-08-14 MED ORDER — AMLODIPINE BESYLATE 10 MG PO TABS
10.0000 mg | ORAL_TABLET | Freq: Every day | ORAL | Status: DC
  Administered 2011-08-14 – 2011-08-16 (×3): 10 mg via ORAL
  Filled 2011-08-14 (×3): qty 1

## 2011-08-14 MED ORDER — HYDROCHLOROTHIAZIDE 25 MG PO TABS
25.0000 mg | ORAL_TABLET | Freq: Every day | ORAL | Status: DC
  Administered 2011-08-14 – 2011-08-16 (×3): 25 mg via ORAL
  Filled 2011-08-14 (×3): qty 1

## 2011-08-14 MED ORDER — ONDANSETRON HCL 4 MG/2ML IJ SOLN
4.0000 mg | Freq: Once | INTRAMUSCULAR | Status: AC
  Administered 2011-08-14: 4 mg via INTRAVENOUS
  Filled 2011-08-14: qty 2

## 2011-08-14 MED ORDER — HYDROCODONE-ACETAMINOPHEN 10-325 MG PO TABS
1.0000 | ORAL_TABLET | Freq: Three times a day (TID) | ORAL | Status: DC
  Administered 2011-08-14 – 2011-08-16 (×7): 1 via ORAL
  Filled 2011-08-14 (×8): qty 1

## 2011-08-14 MED ORDER — PANTOPRAZOLE SODIUM 40 MG PO TBEC
40.0000 mg | DELAYED_RELEASE_TABLET | Freq: Every day | ORAL | Status: DC
  Administered 2011-08-14: 40 mg via ORAL
  Filled 2011-08-14: qty 1

## 2011-08-14 MED ORDER — MORPHINE SULFATE 2 MG/ML IJ SOLN
2.0000 mg | INTRAMUSCULAR | Status: DC | PRN
  Administered 2011-08-14 – 2011-08-16 (×5): 2 mg via INTRAVENOUS
  Filled 2011-08-14 (×5): qty 1

## 2011-08-14 NOTE — Assessment & Plan Note (Signed)
This has not been an issue recently on his current dose of potassium supplements.

## 2011-08-14 NOTE — Progress Notes (Signed)
Patient walked in requesting B P check. States he started having  left chest pain today approx 10:00 AM with nausea and vomiting. Has vomitied 4 times he states. Also has headache.  Dr. Madolyn Frieze notified and she will see patient now.

## 2011-08-14 NOTE — H&P (Signed)
Andrew Huerta is an 45 y.o. male.   Chief Complaint: Chest pain.  HPI:  45 year old male with a history of refractory hypertension, hyperlipidemia, and chronic hypokalemia presents with acute onset of severe chest pain started 10:00 this morning. Chest chest is located in left chest radiating to the left arm and left side of the neck. Associated with the episodes of nausea vomiting. Not associated with palpitations, shortness of breath, visual changes. He took nitroglycerin x2 at home which reduces chest pain to 8/10. The patient initially presented to his PCP at the family practice clinic, he was evaluated and sent by EMS to the ED for possible ACS.   ED course: The patient was evaluated. His EKG was unchanged from previous. He had 2 sets of negative troponins. He received 6 mg of IV morphine. He still complains of constant left-sided chest pain, left neck pain, left arm pain down to his elbow. He also feels that his left hand is cold. In addition he complains of new onset, while in ED, left anterior thigh pain. He describes all of  his pains as severe soreness.   Past Medical History  Diagnosis Date  . Hypertension   . Hypokalemia   . Barrett's esophagus   . Hyperlipidemia   . Degenerative joint disease     Bilateral knees. Significant knee pain since playing football in high school.   . Anxiety   . Low back pain   . Hypokalemia   . Angina   . Sleep apnea   . Chronic kidney disease     stone  . Cancer     barrets diease of esophagus gets cancer test every 2 yrs  . GERD (gastroesophageal reflux disease)   . Headache     when b/p get high  . Hiatal hernia   . Depression     Surgical Hx: knee surgery.  Fam Hx: non contributory.   Social History:  reports that he has never smoked. He has never used smokeless tobacco. He reports that he drinks about .6 ounces of alcohol per week. He reports that he does not use illicit drugs. he is unemployed. He does not have heat at home. He is  engaged to his fiance 10 years. He lives at home with his 2 sons age 66 and 59.  Allergies:  Allergies  Allergen Reactions  . Cortisone Acetate Swelling    REACTION: unspecified  . Darvocet (Propoxyphene N-Acetaminophen) Nausea And Vomiting  . Nsaids     Caused stomach ulcers in the past    Medications Prior to Admission  Medication Sig Dispense Refill  . amLODipine (NORVASC) 10 MG tablet Take 1 tablet (10 mg total) by mouth daily.  30 tablet  3  . atenolol (TENORMIN) 50 MG tablet Take 1 tablet (50 mg total) by mouth 2 (two) times daily.  60 tablet  3  . benazepril-hydrochlorthiazide (LOTENSIN HCT) 20-25 MG per tablet Take 1 tablet by mouth daily.        Marland Kitchen HYDROcodone-acetaminophen (VICODIN) 5-500 MG per tablet Take 1-2 tablets by mouth every 8 (eight) hours as needed. For pain.       . nitroGLYCERIN (NITROSTAT) 0.4 MG SL tablet Place 0.4 mg under the tongue every 5 (five) minutes as needed. For chest pain       . omeprazole (PRILOSEC) 40 MG capsule Take 1 capsule (40 mg total) by mouth daily.  30 capsule  3  . potassium chloride (K-DUR) 10 MEQ tablet Take 2 tablets (20 mEq total)  by mouth daily.  30 tablet  3    Pertinent labs: Potassium 2.9 Magnesium 1.3 Phosphorus 3.0 POC troponins negative x2  Studies: EKG-normal sinus rhythm with a heart rate of 69 normal axis. QTC 427 T wave flattening inferiorly is consistent with EKG obtained on 03/26/11.  CXR: Slightly enlarged cardiac silhouette. No focal infiltrates or effusions or pneumothorax.  ROS Pertinent review systems as per history of present illness. Patient's family reports increased anxiety in the setting of recent loss of job and worsening financial difficulties.  Blood pressure 173/107, pulse 74, temperature 97.7 F (36.5 C), temperature source Oral, resp. rate 18, SpO2 97.00%. Physical Exam  General appearance: alert, cooperative and mild distress Head: Normocephalic, without obvious abnormality, atraumatic Eyes:  conjunctivae/corneas clear. PERRL, EOM's intact. Ears: normal TM's and external ear canals both ears Throat: lips, mucosa, and tongue normal; teeth and gums normal Neck: no adenopathy, no carotid bruit, no JVD, supple, symmetrical, trachea midline and thyroid not enlarged, symmetric, no tenderness/mass/nodules Chest: No rashes, lesions or erythema appreciated. Tender to palpation over left sternum left chest and left scapula. No triggerpoints appreciated. Patient also has some tenderness over her left a.c. Joint.  Lungs: clear to auscultation bilaterally Heart: regular rate and rhythm, S1, S2 normal, no murmur, click, rub or gallop Abdomen: soft, non-tender; bowel sounds normal; no masses,  no organomegaly Extremities: extremities normal, atraumatic, no cyanosis or edema. Mild folliculitis on lower extremities. Mild tenderness to palpation over left eye. No cellulitis, induration, swelling. Pulses: 2+ and symmetric Skin: Skin color, texture, turgor normal. No rashes or lesions Neurologic: Alert and oriented X 3, in 4-5 strength of left upper extremity and left lower extremity. Normal symmetric reflexes. Normal coordination and gait  Assessment/Plan 45 year old male presents with chest pain concerning for acute coronary syndrome.  1. Chest pain: A: Chest pain favors muscle skeletal pain over cardiac. Patient does have a history of Barrett's esophagus but he states that this pain is different from his reflux pain. He has a normal respiratory exam. P:  -Given the patient's multiple risk factors I will admit to telemetry and cycle cardiac enzymes. -Continue home antihypertensive medications. -Treat his pain with Vicodin which she takes at home, and IV morphine as needed for breakthrough pain. -Protonix for reflux component.  -Asa 81 mg PO  2. Hypokalemia: A: Chronic hypokalemia. The patient's home medications are associated with worsening hypokalemia. The patient specifically milk  milliequivalents potassium daily. P: Replete with oral potassium 40 mEq x2. Followup repeat potassium.   3. Hypertension: Patient hypertensive and indeed the ED but he has not taken his home medicines. Plan to continue home regimen.  4. DVT prophylaxis: Subcutaneous heparin.  5. Disposition: Pending clinical improvement in pain and ACS ruled out. Social consult in the a.m. to facilitate patient's accessing community resources.  Yazmyn Valbuena 08/14/2011, 5:53 PM

## 2011-08-14 NOTE — Assessment & Plan Note (Signed)
>>  ASSESSMENT AND PLAN FOR OTHER GASTRITIS WITH BLEEDING WRITTEN ON 06/06/2024  2:44 PM BY ELICIA HAMLET, MD   >>ASSESSMENT AND PLAN FOR BARRETT'S ESOPHAGUS WRITTEN ON 08/14/2011 12:13 PM BY OH PARK, ANGELA J, MD  He has been off his PPI for the past 3 days. He had been well-controlled on Nexium  but his insurance no longer covers this medication, so he was switched to omeprazole , which he reports not helping as much as Nexium  and which he has not taken past 3 days.

## 2011-08-14 NOTE — ED Provider Notes (Signed)
History     CSN: [id number] Arrival date & time: 08/14/2011 12:15 PM   First MD Initiated Contact with Patient 08/14/11 1221      Chief Complaint  Patient presents with  . Chest Pain    (Consider location/radiation/quality/duration/timing/severity/associated sxs/prior treatment) HPI Comments: Patient with onset of left chest pain with radiation to left neck and left elbow at 10 AM. He was worried and made an unscheduled visit to his family practice office for blood pressure check. He was seen and sent to the emergency department for workup of ACS. Patient was given aspirin prior to arrival. Patient was given nitroglycerin x3 with slight improvement in pain. States that the pain has continued and is currently a 7/10. Patient has a history of high blood pressure, high cholesterol. No diabetes, smoking, family history of early MI.  Patient is a 45 y.o. male presenting with chest pain. The history is provided by the patient.  Chest Pain The chest pain began 3 - 5 hours ago. Duration of episode(s) is 3 hours. Chest pain occurs constantly. The severity of the pain is moderate. The quality of the pain is described as pressure-like. The pain radiates to the left neck and left arm. Primary symptoms include nausea and vomiting. Pertinent negatives for primary symptoms include no fever, no syncope, no shortness of breath, no cough, no wheezing, no palpitations and no abdominal pain.  Pertinent negatives for associated symptoms include no diaphoresis, no lower extremity edema, no near-syncope and no orthopnea. He tried nitroglycerin, NSAIDs and aspirin for the symptoms.  His past medical history is significant for hyperlipidemia and hypertension.  Pertinent negatives for past medical history include no MI.  Pertinent negatives for family medical history include: no early MI in family.  Procedure history is positive for stress echo.  Procedure history is negative for cardiac catheterization.     Past  Medical History  Diagnosis Date  . Hypertension   . Hypokalemia   . Barrett's esophagus   . Hyperlipidemia   . Degenerative joint disease     Bilateral knees. Significant knee pain since playing football in high school.   . Anxiety   . Low back pain   . Hypokalemia     No past surgical history on file.  No family history on file.  History  Substance Use Topics  . Smoking status: Never Smoker   . Smokeless tobacco: Never Used  . Alcohol Use: Yes      Review of Systems  Constitutional: Negative for fever and diaphoresis.  HENT: Positive for neck pain.   Eyes: Negative for redness.  Respiratory: Negative for cough, shortness of breath and wheezing.   Cardiovascular: Positive for chest pain. Negative for palpitations, orthopnea, syncope and near-syncope.  Gastrointestinal: Positive for nausea and vomiting. Negative for abdominal pain.  Genitourinary: Negative for dysuria.  Musculoskeletal: Negative for back pain.  Skin: Negative for rash.  Neurological: Negative for syncope and light-headedness.    Allergies  Cortisone acetate and Nsaids  Home Medications   Current Outpatient Rx  Name Route Sig Dispense Refill  . AMLODIPINE BESYLATE 10 MG PO TABS Oral Take 1 tablet (10 mg total) by mouth daily. 30 tablet 3  . ATENOLOL 50 MG PO TABS Oral Take 1 tablet (50 mg total) by mouth 2 (two) times daily. 60 tablet 3  . BENAZEPRIL-HYDROCHLOROTHIAZIDE 20-25 MG PO TABS Oral Take 1 tablet by mouth daily. 30 tablet 3  . EPLERENONE 50 MG PO TABS Oral Take 1 tablet (50 mg total)  by mouth daily. 30 tablet 2  . HYDROCODONE-ACETAMINOPHEN 5-500 MG PO TABS Oral Take 1 tablet by mouth every 8 (eight) hours as needed for pain. Fill in January 2012. 65 tablet 0  . NITROGLYCERIN 0.4 MG SL SUBL Sublingual Place 1 tablet (0.4 mg total) under the tongue every 5 (five) minutes as needed for chest pain. 20 tablet 3  . OMEPRAZOLE 40 MG PO CPDR Oral Take 1 capsule (40 mg total) by mouth daily. 30  capsule 3  . POTASSIUM CHLORIDE CR 10 MEQ PO TBCR Oral Take 2 tablets (20 mEq total) by mouth daily. 30 tablet 3    There were no vitals taken for this visit.  Physical Exam  Nursing note and vitals reviewed. Constitutional: He is oriented to person, place, and time. He appears well-developed and well-nourished.  HENT:  Head: Normocephalic and atraumatic.  Eyes: Conjunctivae are normal. Right eye exhibits no discharge. Left eye exhibits no discharge.  Neck: Normal range of motion. Neck supple. No JVD present.  Cardiovascular: Normal rate, regular rhythm and normal heart sounds.   Pulmonary/Chest: Effort normal and breath sounds normal. He exhibits no tenderness.        Pain is not worsened with palpation or deep breathing.  Abdominal: Soft. Bowel sounds are normal. There is no tenderness. There is no rebound and no guarding.  Musculoskeletal: He exhibits no edema.  Neurological: He is alert and oriented to person, place, and time.  Skin: Skin is warm and dry.  Psychiatric: He has a normal mood and affect.    ED Course  Procedures (including critical care time)  Labs Reviewed  BASIC METABOLIC PANEL - Abnormal; Notable for the following:    Potassium 2.9 (*)    Glucose, Bld 102 (*)    All other components within normal limits  CBC - Abnormal; Notable for the following:    WBC 11.5 (*)    All other components within normal limits  POCT I-STAT TROPONIN I  POCT I-STAT TROPONIN I  I-STAT TROPONIN I  I-STAT TROPONIN I   Dg Chest Portable 1 View  08/14/2011  *RADIOLOGY REPORT*  Clinical Data: Left chest and arm pain, hypertension  PORTABLE CHEST - 1 VIEW  Comparison: Portable exam 1247 hours compared to 05/03/2008  Findings: Upper-normal size of cardiac silhouette. Mediastinal contours and pulmonary vascularity normal. Lungs clear. No pleural effusion or pneumothorax. Bones unremarkable.  IMPRESSION: No acute abnormalities.  Original Report Authenticated By: Lollie Marrow, M.D.      1. Chest pain     12:51 PM patient seen and examined. PCP note reviewed. Aspirin given prior to arrival. Patient is to the chest pains the pain medicine ordered. GCS workup initiated.   Date: 08/14/2011  Rate: 73  Rhythm: normal sinus rhythm  QRS Axis: normal  Intervals: normal  ST/T Wave abnormalities: normal  Conduction Disutrbances:none  Narrative Interpretation:   Old EKG Reviewed: unchanged 03/26/11  2:24 PM patient improved but not resolved chest pain. He still complaining about pain in his neck. Will re dose pain medication, send another troponin, and family practice agrees to see patient in emergency department.  2:55 PM family practice has seen patient. They will admit for cardiac rule out.  MDM  Admit for chest pain.        Eustace Moore [place], [place] 08/14/11 [phone]

## 2011-08-14 NOTE — Assessment & Plan Note (Signed)
Due to playing football and is s/p multiple knee surgeries on his right knee. Pain has been under fair control on Vicodin. He would probably take more Vicodin if given, however. Joint injections have not helped in the past. He is not being followed by an orthopedist currently, but he has seen Dr. Pecolia Ades [facility]) and Dr. Sedonia Small [facility]) previously. Per patient, they recommend knee replacement surgery, which the patient is declining at this time.

## 2011-08-14 NOTE — Assessment & Plan Note (Signed)
His blood pressure about the same today as it was about a month ago. But he is in discomfort today whereas he was not at his previous office visit. I had increased his eplerenone about a month ago. I would continue his current medications for now and re-evaluate his blood pressure when he is more stable.

## 2011-08-14 NOTE — ED Notes (Signed)
Pt to department via [facility] from [facility]. Pt reports that he began having chest pain that radiated into the left arm and hand. Pt denies any n/v or diaphoresis with the pain. Pt received 324 ASA and 3 nitro's PTA. Pt reports that he is still having 7/10 chest pain.

## 2011-08-14 NOTE — Assessment & Plan Note (Signed)
>>  ASSESSMENT AND PLAN FOR BARRETT'S ESOPHAGUS WRITTEN ON 08/14/2011 12:13 PM BY OH PARK, ANGELA J, MD  He has been off his PPI for the past 3 days. He had been well-controlled on Nexium  but his insurance no longer covers this medication, so he was switched to omeprazole , which he reports not helping as much as Nexium  and which he has not taken past 3 days.

## 2011-08-14 NOTE — Assessment & Plan Note (Addendum)
He has been off his PPI for the past 3 days. He had been well-controlled on Nexium but his insurance no longer covers this medication, so he was switched to omeprazole, which he reports not helping as much as Nexium and which he has not taken past 3 days.

## 2011-08-14 NOTE — Progress Notes (Signed)
  Subjective:    Patient ID: Andrew Huerta, male    DOB: [birth year], 45 y.o.   MRN: [id number]  HPI This is a 45 YO Caucasian male with a history of refractory hypertension, hyperlipidemia (recent LDL in 160s), hypokalemia, history of Barrett's esophagus, and severe bilateral lower extremity osteoarthritis presenting with acute chest pain. Started: around 10:00 am this morning acutely. 10/10 left-sided chest pain associated with arm numbness/tingling, headaches, and nausea/vomiting (has vomited several times this morning Medications: took aspirin and NTG x 2 for chest pain Alleviated by: NTG. Pain currently 7-8/10. Exacerbated by: nothing. Not worse with activity Associated symptoms: nausea/vomiting and headache   Denies dypsnea  Patient has a history of chest pain that was previously attributed to his Barrett's esophagus.   2. History of Barrett's esophagus Patient had been on Nexium in the past, but this is no longer covered by his insurance. I recently changed him to omeprazole, which he says has not been helping and which he has not taken for the past 3 days.  Current chest pain is different from usual chronic chest pain by being significantly more severe and being associated with left arm tingling/numbness.  ROS: denies acid taste in mouth  3. History of hypokalemia Has been controlled recently on potassium supplements, which he reports compliance with  Only symptoms he has had previously for hypokalemia has been occasional leg cramping. Not complaining of leg cramping acutely.  4. HTN Reports compliance with medications  Review of Systems Per HPI with inclusion of following: Denies fevers/chills Denies dysuria Complaining of lower back pain Denies abdominal pain  FHx: Significant for grandparents who died from heart disease. Also family history of cancer.  SHx: Denies tobacco or illicit drugs. Occasional alcohol.  Unemployed due to DJD of knees. 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     Objective:   Physical Exam Gen: appears uncomfortable, gripping left arm, accompanied by girlfriend Psych: appropriate HEENT:   Eyes: normal   Nose: no nasal congestion   Neck: no LAD CV: normal S1/S2, RRR, no murmurs Abd: NABS, soft, NT, ND Back: mild tenderness lower lumbar area diffusely Ext: no edema, no swelling     Assessment & Plan:  This is a 45 YO Caucasian male with a history of refractory hypertension, hyperlipidemia (recent LDL in 160s), hypokalemia, history of Barrett's esophagus, and severe bilateral lower extremity osteoarthritis presenting with acute chest pain with typical symptoms. Risk factors include refractory hypertension and hyperlipidemia.  Presentation concerning for acute ACS. Will send over to ED via EMS to be evaluated. Patient has already taken an aspirin and NTG x 2.

## 2011-08-14 NOTE — Telephone Encounter (Signed)
Sent Rx for omeprazole instead.

## 2011-08-14 NOTE — Assessment & Plan Note (Signed)
Acute on chronic chest pain. However, current chest pain is significantly more severe and associated with worrisome findings (nausea/vomiting, left arm tingling/numbness, headache) that are usually not present when he has his usual chest pain episodes. Previously, his chest pain was attributed to manifestations of his Barrett's esophagus. I considered referring him to a cardiologist for a possible stress test at his last visit in early November but decided to wait to see if being on an increased dose of Nexium helped. Patient was scheduled for follow-up appointment last week, which he missed. Currently, I am concerned for ACS. His risk factors include his refractory hypertension and hyperlipidemia (recent LDL was in the 160s). He is not on a statin. He is a non-smoker, non-diabetic, and thin. He was given NTG at his last appointment, and he has taken 2 doses along with some aspirin and he reports improvement in his chest pain although it is still severe (went from 10 to now 7-8/10. Will send over to ED via EMS due to concern for active ACS. Will notify FMTS in case patient is admitted.

## 2011-08-14 NOTE — ED Provider Notes (Signed)
Medical screening examination/treatment/procedure(s) were performed by non-physician practitioner and as supervising physician I was immediately available for consultation/collaboration.  Tristyn Pharris R. Tierra Divelbiss, MD 08/14/11 1551 

## 2011-08-15 ENCOUNTER — Other Ambulatory Visit: Payer: Self-pay

## 2011-08-15 ENCOUNTER — Encounter (HOSPITAL_COMMUNITY): Payer: Self-pay | Admitting: Nurse Practitioner

## 2011-08-15 DIAGNOSIS — R079 Chest pain, unspecified: Secondary | ICD-10-CM

## 2011-08-15 LAB — CARDIAC PANEL(CRET KIN+CKTOT+MB+TROPI)
CK, MB: 2 ng/mL (ref 0.3–4.0)
CK, MB: 2 ng/mL (ref 0.3–4.0)
Total CK: 55 U/L (ref 7–232)

## 2011-08-15 LAB — BASIC METABOLIC PANEL
BUN: 11 mg/dL (ref 6–23)
BUN: 14 mg/dL (ref 6–23)
CO2: 35 mEq/L — ABNORMAL HIGH (ref 19–32)
CO2: 30 mEq/L (ref 19–32)
Calcium: 9.8 mg/dL (ref 8.4–10.5)
Chloride: 101 mEq/L (ref 96–112)
Creatinine, Ser: 0.81 mg/dL (ref 0.50–1.35)
GFR calc non Af Amer: >90 mL/min (ref >90)
GFR calc non Af Amer: >90 mL/min (ref >90)
Glucose, Bld: 111 mg/dL — ABNORMAL HIGH (ref 70–99)
Glucose, Bld: 90 mg/dL (ref 70–99)
Potassium: 3.3 mEq/L — ABNORMAL LOW (ref 3.5–5.1)
Sodium: 141 mEq/L (ref 135–145)

## 2011-08-15 MED ORDER — POTASSIUM CHLORIDE CRYS ER 20 MEQ PO TBCR
40.0000 meq | EXTENDED_RELEASE_TABLET | ORAL | Status: AC
  Administered 2011-08-15 (×3): 40 meq via ORAL
  Filled 2011-08-15: qty 2

## 2011-08-15 MED ORDER — BENAZEPRIL HCL 40 MG PO TABS
40.0000 mg | ORAL_TABLET | Freq: Every day | ORAL | Status: DC
  Administered 2011-08-16: 40 mg via ORAL
  Filled 2011-08-15: qty 1

## 2011-08-15 MED ORDER — POTASSIUM CHLORIDE CRYS ER 20 MEQ PO TBCR
40.0000 meq | EXTENDED_RELEASE_TABLET | Freq: Three times a day (TID) | ORAL | Status: DC
  Filled 2011-08-15 (×3): qty 4

## 2011-08-15 MED ORDER — ASPIRIN 325 MG PO TABS
325.0000 mg | ORAL_TABLET | Freq: Once | ORAL | Status: AC
  Administered 2011-08-15: 325 mg via ORAL
  Filled 2011-08-15: qty 1

## 2011-08-15 MED ORDER — NITROGLYCERIN 0.4 MG SL SUBL: 0.4000 mg | SUBLINGUAL_TABLET | SUBLINGUAL | Status: DC | PRN

## 2011-08-15 MED ORDER — ASPIRIN EC 81 MG PO TBEC
81.0000 mg | DELAYED_RELEASE_TABLET | Freq: Every day | ORAL | Status: DC
  Filled 2011-08-15: qty 1

## 2011-08-15 MED ORDER — ASPIRIN EC 81 MG PO TBEC
81.0000 mg | DELAYED_RELEASE_TABLET | Freq: Every day | ORAL | Status: DC
  Administered 2011-08-16: 81 mg via ORAL
  Filled 2011-08-15: qty 1

## 2011-08-15 MED ORDER — ATENOLOL 50 MG PO TABS
50.0000 mg | ORAL_TABLET | Freq: Two times a day (BID) | ORAL | Status: DC
  Administered 2011-08-15 – 2011-08-16 (×2): 50 mg via ORAL
  Filled 2011-08-15 (×3): qty 1

## 2011-08-15 MED ORDER — PANTOPRAZOLE SODIUM 40 MG PO TBEC
40.0000 mg | DELAYED_RELEASE_TABLET | Freq: Two times a day (BID) | ORAL | Status: DC
  Administered 2011-08-15 – 2011-08-16 (×2): 40 mg via ORAL
  Filled 2011-08-15: qty 1
  Filled 2011-08-15: qty 2
  Filled 2011-08-15: qty 1
  Filled 2011-08-15: qty 2

## 2011-08-15 NOTE — Progress Notes (Signed)
I have seen and examined this patient. I have discussed with Dr Merrell.  I agree with their findings and plans as documented in their progress note for today.  

## 2011-08-15 NOTE — Progress Notes (Signed)
Family Medicine Teaching Service [facility] Progress Note  Patient name: Andrew Huerta Medical record number: [id number] Date of birth: Jun 17, 1966 Age: 45 y.o. Gender: male    LOS: 1 day   Primary Care Provider: [facility], Marylene Land, MD, MD  Overnight Events: Patient slept well. Research is chest pain this morning for approximately 45 minutes starting at 6 AM. Pain was noted to be sharp and located in his mid chest with radiation to the right chest. Patient unable to distinguish between substernal cardiac or superficial pain. Patient denies headache, syncope, shortness of breath, diaphoresis. Patient reported ambulating at time of pain. Patient returned to bed and received morphine without much relief. Patient reports episodes like this occur on a daily basis at home with her ambulating or at rest. No aggravating or alleviating factors noted. Not other associated cardiac symptoms. Patient with known history of reflux secondary to hiatal hernia. Patient reports pain not associated with reflux pain.  Patient reports some nausea with breakfast but states food not very palpable.   Objective: Vital signs in last 24 hours: Temp:  [97.4 F (36.3 C)-98.6 F (37 C)] 97.4 F (36.3 C) (12/14 0553) Pulse Rate:  [60-74] 60  (12/14 0553) Resp:  [10-19] 18  (12/14 0553) BP: (130-173)/(82-115) 139/91 mmHg (12/14 0553) SpO2:  [97 %-99 %] 97 % (12/14 0553) Weight:  [160 lb (72.576 kg)-160 lb 12.8 oz (72.938 kg)] 160 lb (72.576 kg) (12/13 1958)  Wt Readings from Last 3 Encounters:  08/14/11 160 lb (72.576 kg)  08/14/11 160 lb 12.8 oz (72.938 kg)  07/08/11 160 lb 6.4 oz (72.757 kg)     Current Facility-Administered Medications  Medication Dose Route Frequency Provider Last Rate Last Dose  . amLODipine (NORVASC) tablet 10 mg  10 mg Oral Daily Josalyn Funches   10 mg at 08/14/11 1744  . aspirin EC tablet 81 mg  81 mg Oral Daily Josalyn Funches      . aspirin tablet 325 mg  325 mg Oral Once Josalyn Funches       . atenolol (TENORMIN) tablet 50 mg  50 mg Oral BID Josalyn Funches   50 mg at 08/14/11 2142  . benazepril (LOTENSIN) tablet 20 mg  20 mg Oral Daily Elwin Sleight, PHARMD   20 mg at 08/14/11 1744  . gi cocktail  30 mL Oral BID PRN Shelly Flatten, MD   30 mL at 08/14/11 2304  . heparin injection 5,000 Units  5,000 Units Subcutaneous Q8H Josalyn Funches   5,000 Units at 08/15/11 0555  . hydrochlorothiazide (HYDRODIURIL) tablet 25 mg  25 mg Oral Daily Elwin Sleight, PHARMD   25 mg at 08/14/11 1744  . HYDROcodone-acetaminophen (NORCO) 10-325 MG per tablet 1 tablet  1 tablet Oral Q8H Josalyn Funches   1 tablet at 08/15/11 0555  . morphine 2 MG/ML injection 2 mg  2 mg Intravenous Q4H PRN Josalyn Funches   2 mg at 08/15/11 0700  . morphine 4 MG/ML injection 6 mg  6 mg Intravenous Once Carolee Rota, PA   6 mg at 08/14/11 1301  . ondansetron (ZOFRAN) injection 4 mg  4 mg Intravenous Once Carolee Rota, PA   4 mg at 08/14/11 1259  . ondansetron (ZOFRAN) tablet 4 mg  4 mg Oral Q6H PRN Josalyn Funches       Or  . ondansetron (ZOFRAN) injection 4 mg  4 mg Intravenous Q6H PRN Josalyn Funches      . pantoprazole (PROTONIX) EC tablet 40 mg  40 mg Oral Q1200 Josalyn Funches   40 mg at 08/14/11 1603  . potassium chloride SA (K-DUR,KLOR-CON) CR tablet 40 mEq  40 mEq Oral Once Carolee Rota, PA   40 mEq at 08/14/11 1441  . potassium chloride SA (K-DUR,KLOR-CON) CR tablet 40 mEq  40 mEq Oral Once Josalyn Funches   40 mEq at 08/14/11 1831  . potassium chloride SA (K-DUR,KLOR-CON) CR tablet 40 mEq  40 mEq Oral TID Drake Leach Rumbarger, PHARMD      . DISCONTD: aspirin EC tablet 81 mg  81 mg Oral Daily Josalyn Funches      . DISCONTD: benazepril-hydrochlorthiazide (LOTENSIN HCT) 20-25 MG per tablet 1 tablet  1 tablet Oral Daily Josalyn Funches      . DISCONTD: potassium chloride (KLOR-CON) CR tablet 40 mEq  40 mEq Oral Daily Josalyn Funches         PE: Gen: No acute distress, well-nourished  well-developed HEENT: Moist mucous membranes, PERRLA, no cervical lymphadenopathy CV: Regular rate and rhythm, no murmurs rubs or gallops Res: Clear to auscultation bilaterally normal effort Abd: Normal active bowel sounds, nonpainful to palpation Ext/Musc: 2+ pulses throughout, no musculoskeletal pain noted on palpation. Neuro: Crit odors grossly intact  Labs/Studies:  Basic Metabolic Panel:    Component Value Date/Time   NA 140 08/15/2011 0530   K 3.1* 08/15/2011 0530   CL 97 08/15/2011 0530   CO2 35* 08/15/2011 0530   BUN 11 08/15/2011 0530   CREATININE 0.81 08/15/2011 0530   CREATININE 0.78 07/03/2011 0848   GLUCOSE 111* 08/15/2011 0530   CALCIUM 9.8 08/15/2011 0530    Stat Troponins: Pending Stat EKG: Normal sinus rhythm, Incomplete right bundle branch block, Cardiologist comments "Additional cardiologist comments: Since last tracing 08/14/11 better Baseline wander Otherwise no significant change"   Assessment/Plan: 45 year old male with intermittent chest pain concerning for acute coronary syndrome.   1. Chest pain: Symptoms likely musculoskeletal due multiple unremarkable tests, but not entirely consistent w/ musculoskeletal pain. Possible multifactorial in that pt also w/ known hiatal hernia w/ uncontrolled reflux. Possible some psychosomatic component as pt admitted earlier to lying about having not active chest pain in order to get bed placement. Telemetry tracings during his morning event and STAT EKG not concerning for MI. Will officially consult cardiology. Will start continue ASA 81mg  and protonix. Will continue w/ home vicodin and keep on morphine for breakthrough. Will also add nitro to assess efficacy during episodes.   2. Hypokalemia: Improving. Chronic hypokalemic. Will continue w/ repleation w/ K-Dur x 3 today adn will recheck BMET at 16:00  3. Hypertension: Patient HTN at home and on 4 home hypertensive medications. Plan to continue home regimen and will  increase Pts Benazepril to 40mg  Qday.  4.  FEN/GI: tolerating PO. SLIV. Hiattal hernia noted in pt hx. Will continue Protonix 40 BID.  4. Prophylaxis: Subcutaneous heparin 5,000 units TID.   5. Disposition: Pending clinical improvement, and evaluation by cardiology    Signed: Shelly Flatten, MD Family Medicine Resident PGY-1 08/15/2011 9:23 AM

## 2011-08-15 NOTE — H&P (Signed)
I have seen and examined this patient. I have discussed with Dr Armen Pickup.  I agree with their findings and plans as documented in their admit note.  Acute Issues  Left precordial chest pain Associated Left lateral neck pain and left arm pain and left thigh pain Associated vomiting after onset of chest pain Hx of Resistant Hypertension. EKG without evidence of acute ST-T changes.  Tropinin I < 0.3 twice. Persistent left precordial pain this morning.  Resolved left neck, arm and thigh pain  This morning. Tender over left precordial chest wall.   CXR without acute process.   Suspect musculoskeletal origin of pain, but given hx of resistant hypertension, will ask cardiology to consult as to whether CAD testing is appropriate, and if so, should it be inpatient or outpatient.   Will check D-dimer given left leg pain with chest pain, and slight difference in left calf circumference compared to right calf. Well's Criteria for P.E. Is low probability.   Will increase PPI to twice a day.  Would increase home omeprazole to 40 mg bid given ongoing problems with heartburn since his insurance change him from Nexium to omeprazole.

## 2011-08-15 NOTE — Consult Note (Signed)
CARDIOLOGY CONSULT NOTE  Patient ID: Andrew Huerta MRN: [id number], DOB/AGE: [birth year]   Admit date: 08/14/2011 Date of Consult: 08/15/2011   Primary Physician: Priscella Mann, MD, MD Primary Cardiologist: new - P. [place]  Pt. Profile:   45 y/o male with long history of GERD/Barrett's Esophagus and chronic chest pain who presented to ER yesterday with prolonged c/p.  Problem List: Past Medical History  Diagnosis Date  . Hypertension   . Hypokalemia   . Barrett's esophagus     egd - 11/27/09 - short segment of Barrett's  . Hyperlipidemia   . Degenerative joint disease     Bilateral knees. Significant knee pain since playing football in high school.   . Anxiety   . Low back pain   . Sleep apnea   . Chronic kidney disease     stone  . Cancer     barrets diease of esophagus gets cancer test every 2 yrs  . GERD (gastroesophageal reflux disease)   . Headache     when b/p get high  . Hiatal hernia     egd - 11/27/2009  . Depression   . Chest pain     NL Echo 08/2006    * Hypokalemia - this admission  Allergies:  Allergies  Allergen Reactions  . Cortisone Acetate Swelling    REACTION: unspecified  . Darvocet (Propoxyphene N-Acetaminophen) Nausea And Vomiting  . Nsaids     Caused stomach ulcers in the past     HPI:   45 y/o male with the above complex problem list.  Pt has a 5-6 yr history of almost daily episodes of left chest pain, usually occurring @ rest, without assoc Ss, lasting a few mins and resolving spontaneously.  Symptoms do not occur with activity.  He thinks he had a stress test 7+ years ago, which was reportedly NL (nothing in computer here).  He also has a h/o gerd and barrett's esophagus, previously managed with Nexium, however b/c of insurance changes, he had to come off of nexium a few days ago and has been taking prilosec.  He feels that he can distinguish between gerd pain and chest pain.  Yesterday, pt was resting and sudden onset of left chest  and shoulder pain, radiating to the left elbow, with numbness of his left hand.  He also developed nausea and says he vomited about 10x in a span of 45 mins or so.  Once vomiting had subsided, pt took 2 sl NTG without change in Ss.  He presented to his PCP, and was subsequently referred to the ED.  In the ED, he was treated with additional NTG, again without much effect.  His Ss lasted throughout the day yesterday (>12 hrs per pt) and eventually eased off last night.  Despite prolonged Ss, his CE have been negative and ekg is non-acute.  He had recurrent c/p this am - short lived - and we've been asked to eval.  He is currently pain free.   Inpatient Medications:     . amLODipine  10 mg Oral Daily  . aspirin EC  81 mg Oral Daily  . aspirin  325 mg Oral Once  . atenolol  50 mg Oral BID  . benazepril  20 mg Oral Daily  . heparin  5,000 Units Subcutaneous Q8H  . hydrochlorothiazide  25 mg Oral Daily  . HYDROcodone-acetaminophen  1 tablet Oral Q8H  .  morphine injection  6 mg Intravenous Once  . ondansetron  4  mg Intravenous Once  . pantoprazole  40 mg Oral Q1200  . potassium chloride  40 mEq Oral Once  . potassium chloride  40 mEq Oral Once  . potassium chloride SA  40 mEq Oral Q2H  . DISCONTD: aspirin EC  81 mg Oral Daily  . DISCONTD: benazepril-hydrochlorthiazide  1 tablet Oral Daily  . DISCONTD: potassium chloride  40 mEq Oral Daily  . DISCONTD: potassium chloride SA  40 mEq Oral TID    No family history on file.   History   Social History  . Marital Status: Divorced    Spouse Name: N/A    Number of Children: N/A  . Years of Education: N/A   Occupational History  . Not on file.   Social History Main Topics  . Smoking status: Never Smoker   . Smokeless tobacco: Never Used  . Alcohol Use: 0.6 oz/week    1 Shots of liquor per week  . Drug Use: No  . Sexually Active: Yes   Other Topics Concern  . Not on file   Social History Narrative   Unemployed. Single dad.       Review of Systems: General: negative for chills, fever, night sweats or weight changes.  Cardiovascular:+++ for chest pain/left shoulder pain.  No dyspnea on exertion, edema, orthopnea, palpitations, paroxysmal nocturnal dyspnea. Dermatological: negative for rash Respiratory: negative for cough or wheezing Urologic: negative for hematuria Abdominal: +++ for nausea, vomiting, and h/o indigestion in setting of gerd and barrett's esoph.  No diarrhea, bright red blood per rectum, melena, or hematemesis Neurologic: negative for visual changes, syncope, or dizziness All other systems reviewed and are otherwise negative except as noted above.  Physical Exam: Blood pressure 161/96, pulse 60, temperature 97.4 F (36.3 C), temperature source Oral, resp. rate 18, height 5\' 7"  (1.702 m), weight 160 lb (72.576 kg), SpO2 97.00%.  General: Well developed, well nourished, in no acute distress. Head: Normocephalic, atraumatic, sclera non-icteric, no xanthomas, nares are without discharge.  Neck: Supple without bruits or JVD. Lungs:  Resp regular and unlabored, CTA. Heart: RRR no s3, s4, or murmurs. Abdomen: Soft, non-tender, non-distended, BS + x 4.  Msk:  Strength and tone appears normal for age. Extremities: No clubbing, cyanosis or edema. DP/PT/Radials 2+ and equal bilaterally. Neuro: Alert and oriented X 3. Moves all extremities spontaneously. Psych: Normal affect.   Labs:   Results for orders placed during the hospital encounter of 08/14/11 (from the past 72 hour(s))  BASIC METABOLIC PANEL     Status: Abnormal   Collection Time   08/14/11 12:38 PM      Component Value Range Comment   Sodium 140  135 - 145 (mEq/L)    Potassium 2.9 (*) 3.5 - 5.1 (mEq/L)    Chloride 100  96 - 112 (mEq/L)    CO2 31  19 - 32 (mEq/L)    Glucose, Bld 102 (*) 70 - 99 (mg/dL)    BUN 11  6 - 23 (mg/dL)    Creatinine, Ser 1.61  0.50 - 1.35 (mg/dL)    Calcium 9.1  8.4 - 10.5 (mg/dL)    GFR calc non Af Amer >90   >90 (mL/min)    GFR calc Af Amer >90  >90 (mL/min)   CBC     Status: Abnormal   Collection Time   08/14/11 12:38 PM      Component Value Range Comment   WBC 11.5 (*) 4.0 - 10.5 (K/uL)    RBC 4.64  4.22 -  5.81 (MIL/uL)    Hemoglobin 14.3  13.0 - 17.0 (g/dL)    HCT 16.1  09.6 - 04.5 (%)    MCV 86.0  78.0 - 100.0 (fL)    MCH 30.8  26.0 - 34.0 (pg)    MCHC 35.8  30.0 - 36.0 (g/dL)    RDW 40.9  81.1 - 91.4 (%)    Platelets 209  150 - 400 (K/uL)   POCT I-STAT TROPONIN I     Status: Normal   Collection Time   08/14/11 12:55 PM      Component Value Range Comment   Troponin i, poc 0.01  0.00 - 0.08 (ng/mL)    Comment 3            POCT I-STAT TROPONIN I     Status: Normal   Collection Time   08/14/11  2:36 PM      Component Value Range Comment   Troponin i, poc 0.01  0.00 - 0.08 (ng/mL)    Comment 3            MAGNESIUM     Status: Normal   Collection Time   08/14/11  3:49 PM      Component Value Range Comment   Magnesium 1.9  1.5 - 2.5 (mg/dL)   PHOSPHORUS     Status: Normal   Collection Time   08/14/11  3:49 PM      Component Value Range Comment   Phosphorus 3.0  2.3 - 4.6 (mg/dL)   CARDIAC PANEL(CRET KIN+CKTOT+MB+TROPI)     Status: Normal   Collection Time   08/14/11  3:49 PM      Component Value Range Comment   Total CK 48  7 - 232 (U/L)    CK, MB 1.9  0.3 - 4.0 (ng/mL)    Troponin I <0.30  <0.30 (ng/mL)    Relative Index RELATIVE INDEX IS INVALID  0.0 - 2.5    CBC     Status: Abnormal   Collection Time   08/14/11  5:57 PM      Component Value Range Comment   WBC 11.9 (*) 4.0 - 10.5 (K/uL)    RBC 4.59  4.22 - 5.81 (MIL/uL)    Hemoglobin 14.3  13.0 - 17.0 (g/dL)    HCT 78.2  95.6 - 21.3 (%)    MCV 86.5  78.0 - 100.0 (fL)    MCH 31.2  26.0 - 34.0 (pg)    MCHC 36.0  30.0 - 36.0 (g/dL)    RDW 08.6  57.8 - 46.9 (%)    Platelets 203  150 - 400 (K/uL)   CREATININE, SERUM     Status: Normal   Collection Time   08/14/11  5:57 PM      Component Value Range Comment    Creatinine, Ser 0.77  0.50 - 1.35 (mg/dL)    GFR calc non Af Amer >90  >90 (mL/min)    GFR calc Af Amer >90  >90 (mL/min)   BASIC METABOLIC PANEL     Status: Abnormal   Collection Time   08/14/11  8:18 PM      Component Value Range Comment   Sodium 140  135 - 145 (mEq/L)    Potassium 3.0 (*) 3.5 - 5.1 (mEq/L)    Chloride 100  96 - 112 (mEq/L)    CO2 29  19 - 32 (mEq/L)    Glucose, Bld 99  70 - 99 (mg/dL)    BUN 11  6 - 23 (  mg/dL)    Creatinine, Ser 4.54  0.50 - 1.35 (mg/dL)    Calcium 9.1  8.4 - 10.5 (mg/dL)    GFR calc non Af Amer >90  >90 (mL/min)    GFR calc Af Amer >90  >90 (mL/min)   CARDIAC PANEL(CRET KIN+CKTOT+MB+TROPI)     Status: Normal   Collection Time   08/14/11 11:08 PM      Component Value Range Comment   Total CK 55  7 - 232 (U/L)    CK, MB 2.0  0.3 - 4.0 (ng/mL)    Troponin I <0.30  <0.30 (ng/mL)    Relative Index RELATIVE INDEX IS INVALID  0.0 - 2.5    BASIC METABOLIC PANEL     Status: Abnormal   Collection Time   08/15/11  5:30 AM      Component Value Range Comment   Sodium 140  135 - 145 (mEq/L)    Potassium 3.1 (*) 3.5 - 5.1 (mEq/L)    Chloride 97  96 - 112 (mEq/L)    CO2 35 (*) 19 - 32 (mEq/L)    Glucose, Bld 111 (*) 70 - 99 (mg/dL)    BUN 11  6 - 23 (mg/dL)    Creatinine, Ser 0.98  0.50 - 1.35 (mg/dL)    Calcium 9.8  8.4 - 10.5 (mg/dL)    GFR calc non Af Amer >90  >90 (mL/min)    GFR calc Af Amer >90  >90 (mL/min)     Radiology/Studies: Dg Chest Portable 1 View  08/14/2011  *RADIOLOGY REPORT*  Clinical Data: Left chest and arm pain, hypertension  PORTABLE CHEST - 1 VIEW  Comparison: Portable exam 1247 hours compared to 05/03/2008  Findings: Upper-normal size of cardiac silhouette. Mediastinal contours and pulmonary vascularity normal. Lungs clear. No pleural effusion or pneumothorax. Bones unremarkable.  IMPRESSION: No acute abnormalities.  Original Report Authenticated By: Lollie Marrow, M.D.    EKG:  RSR, 62, No acute ST/T changes.  ASSESSMENT  AND PLAN:   1.  Chest Pain:  Pt with a 5-6 yr history of intermittent rest chest pain - most days of the week - without associated Ss.  Yesterday's Ss were more prolonged and assoc with nausea, vomiting, left shoulder pain and left hand numbness and lasted all day (>12hrs).  NTG did not significantly change Ss yesterday.  He has had intermittent Ss here.  Despite prolonged Ss, his enzymes are negative and EKG is non-acute, making ischemia very unlikely.  He is currently pain free.  Given his risk factors and ongoing Ss we will plan on an inpatient myoview (says he's had one here before in remote past but I don't see it in echart).  2.  GERD/Barrett's Esophagus:  Hard to know how this contributes to current Ss.  Pt prev on Nexium, which worked well, but came off 3 days ago 2/2 insurance issues.  Of note, patient has both chronic indigestion and chronic chest pain (as above) and is able to distinguish between the two.  He doesn't think current Ss gerd related.  3.  HTN:  BP up this am. Agree with pushing Acei to 40mg  daily.  4.  Hypokalemia:  Supp as ordered.   Signed, Nicolasa Ducking, NP 08/15/2011, 10:05 AM   Patient seen and examined and history reviewed. Agree with above findings and plan.Cardiac exam is normal. Lungs clear. No murmurs or rub. Ecg is normal. Normal enzymes. Patient has a long history of atypical chest pain. This episode was more severe  and lasted longer. Given history of HTN and hyperlipidemia it would be prudent to evaluate ischemic risk. Will arrange a nuclear stress test in am.   Thedora Hinders 08/15/2011 11:15 AM

## 2011-08-16 ENCOUNTER — Other Ambulatory Visit (HOSPITAL_COMMUNITY): Payer: Medicaid Other

## 2011-08-16 ENCOUNTER — Inpatient Hospital Stay (HOSPITAL_COMMUNITY): Payer: Medicaid Other

## 2011-08-16 DIAGNOSIS — R079 Chest pain, unspecified: Secondary | ICD-10-CM

## 2011-08-16 LAB — BASIC METABOLIC PANEL
Calcium: 9.7 mg/dL (ref 8.4–10.5)
GFR calc non Af Amer: >90 mL/min (ref >90)
Potassium: 3.3 mEq/L — ABNORMAL LOW (ref 3.5–5.1)
Sodium: 138 mEq/L (ref 135–145)

## 2011-08-16 LAB — CBC
Hemoglobin: 14.4 g/dL (ref 13.0–17.0)
MCH: 30 pg (ref 26.0–34.0)
MCHC: 34.8 g/dL (ref 30.0–36.0)
Platelets: 213 10*3/uL (ref 150–400)
RBC: 4.8 MIL/uL (ref 4.22–5.81)

## 2011-08-16 MED ORDER — ASPIRIN 81 MG PO TBEC: 81.0000 mg | DELAYED_RELEASE_TABLET | Freq: Every day | ORAL | Status: DC

## 2011-08-16 MED ORDER — POTASSIUM CHLORIDE CRYS ER 20 MEQ PO TBCR: 40.0000 meq | EXTENDED_RELEASE_TABLET | ORAL | Status: DC

## 2011-08-16 MED ORDER — TECHNETIUM TC 99M SESTAMIBI - CARDIOLITE
10.0000 | Freq: Once | INTRAVENOUS | Status: AC | PRN
  Administered 2011-08-16: 10 via INTRAVENOUS

## 2011-08-16 MED ORDER — TECHNETIUM TC 99M TETROFOSMIN IV KIT
30.0000 | PACK | Freq: Once | INTRAVENOUS | Status: AC | PRN
  Administered 2011-08-16: 30 via INTRAVENOUS

## 2011-08-16 MED ORDER — POTASSIUM CHLORIDE CRYS ER 20 MEQ PO TBCR
40.0000 meq | EXTENDED_RELEASE_TABLET | ORAL | Status: AC
  Administered 2011-08-16 (×3): 40 meq via ORAL
  Filled 2011-08-16 (×3): qty 2

## 2011-08-16 MED ORDER — BENAZEPRIL HCL 40 MG PO TABS: 40.0000 mg | ORAL_TABLET | Freq: Every day | ORAL | Status: DC

## 2011-08-16 MED ORDER — HYDROCHLOROTHIAZIDE 25 MG PO TABS: 25.0000 mg | ORAL_TABLET | Freq: Every day | ORAL | Status: DC

## 2011-08-16 NOTE — Progress Notes (Signed)
Stress Myoview study reviewed, no evidence of ischemia with normal EF. Patient had excellent exercise tolerance and no evidence of exertional symptoms or exercise-induced arrhythmia. Ok for discharge from cardiology standpoint.  Kriste Basque Dunn PA-C 08/16/2011 2:39 PM

## 2011-08-16 NOTE — Progress Notes (Signed)
Family Medicine Teaching Service [facility] Progress Note  Patient name: Andrew Huerta Medical record number: [id number] Date of birth: Sep 05, 1965 Age: 45 y.o. Gender: male    LOS: 2 days   Primary Care Provider: [facility], Marylene Land, MD, MD  Overnight Events: Slept well overnight. Patient reports one brief episode of mild left upper chest pain upon awakening in the middle the night which lasted only a couple of seconds. Pain is not associated with any other symptoms. Patient fell back to sleep immediately after pain subsided. Patient denies any other chest pain, shortness of breath, nausea, vomiting, diarrhea, syncope, lightheadedness, diaphoresis. Patient tolerating regular diet  Objective: Vital signs in last 24 hours: Temp:  [97.6 F (36.4 C)-97.9 F (36.6 C)] 97.6 F (36.4 C) (12/15 0500) Pulse Rate:  [62-74] 62  (12/15 0500) Resp:  [18-20] 18  (12/15 0500) BP: (137-161)/(84-96) 137/86 mmHg (12/15 0500) SpO2:  [97 %-98 %] 97 % (12/15 0500)  Wt Readings from Last 3 Encounters:  08/14/11 160 lb (72.576 kg)  08/14/11 160 lb 12.8 oz (72.938 kg)  07/08/11 160 lb 6.4 oz (72.757 kg)     Current Facility-Administered Medications  Medication Dose Route Frequency Provider Last Rate Last Dose  . amLODipine (NORVASC) tablet 10 mg  10 mg Oral Daily Josalyn Funches   10 mg at 08/15/11 1007  . aspirin EC tablet 81 mg  81 mg Oral Daily Josalyn Funches      . aspirin tablet 325 mg  325 mg Oral Once Josalyn Funches   325 mg at 08/15/11 1008  . atenolol (TENORMIN) tablet 50 mg  50 mg Oral BID Ok Anis, NP   50 mg at 08/15/11 2254  . benazepril (LOTENSIN) tablet 40 mg  40 mg Oral Daily Shelly Flatten, MD      . gi cocktail  30 mL Oral BID PRN Shelly Flatten, MD   30 mL at 08/14/11 2304  . heparin injection 5,000 Units  5,000 Units Subcutaneous Q8H Josalyn Funches   5,000 Units at 08/16/11 0650  . hydrochlorothiazide (HYDRODIURIL) tablet 25 mg  25 mg Oral Daily Elwin Sleight, PHARMD   25  mg at 08/15/11 1008  . HYDROcodone-acetaminophen (NORCO) 10-325 MG per tablet 1 tablet  1 tablet Oral Q8H Josalyn Funches   1 tablet at 08/16/11 0650  . morphine 2 MG/ML injection 2 mg  2 mg Intravenous Q4H PRN Josalyn Funches   2 mg at 08/15/11 2000  . nitroGLYCERIN (NITROSTAT) SL tablet 0.4 mg  0.4 mg Sublingual Q5 Min x 3 PRN Shelly Flatten, MD      . ondansetron [facility]) tablet 4 mg  4 mg Oral Q6H PRN Josalyn Funches       Or  . ondansetron (ZOFRAN) injection 4 mg  4 mg Intravenous Q6H PRN Josalyn Funches      . pantoprazole (PROTONIX) EC tablet 40 mg  40 mg Oral BID AC Leighton Roach McDiarmid, MD   40 mg at 08/16/11 0650  . potassium chloride SA (K-DUR,KLOR-CON) CR tablet 40 mEq  40 mEq Oral Q2H Shelly Flatten, MD   40 mEq at 08/15/11 1555  . DISCONTD: atenolol (TENORMIN) tablet 50 mg  50 mg Oral BID Josalyn Funches   50 mg at 08/15/11 1007  . DISCONTD: benazepril (LOTENSIN) tablet 20 mg  20 mg Oral Daily Elwin Sleight, PHARMD   20 mg at 08/15/11 1008  . DISCONTD: pantoprazole (PROTONIX) EC tablet 40 mg  40 mg Oral Q1200 Josalyn Funches   40  mg at 08/14/11 1603  . DISCONTD: potassium chloride SA (K-DUR,KLOR-CON) CR tablet 40 mEq  40 mEq Oral TID Drake Leach Rumbarger, PHARMD         PE: Gen: No acute distress, well-nourished well-developed  HEENT: Moist mucous membranes,   CV: Regular rate and rhythm, no murmurs rubs or gallops  Res: Clear to auscultation bilaterally normal effort  Abd: Normal active bowel sounds, nonpainful to palpation  Ext/Musc: 2+ pulses throughout, mild superficial L upper chest pain on palpation. .  Neuro: Cranial Nerves grossly intact   Labs/Studies:  CBC:    Component Value Date/Time   WBC 8.9 08/16/2011 0600   HGB 14.4 08/16/2011 0600   HCT 41.4 08/16/2011 0600   PLT 213 08/16/2011 0600   MCV 86.3 08/16/2011 0600   NEUTROABS 7.3 11/28/2010 1204   LYMPHSABS 2.3 11/28/2010 1204   MONOABS 0.6 11/28/2010 1204   EOSABS 0.0 11/28/2010 1204   BASOSABS 0.0  11/28/2010 1204     Basic Metabolic Panel:    Component Value Date/Time   NA 138 08/16/2011 0600   K 3.3* 08/16/2011 0600   CL 99 08/16/2011 0600   CO2 28 08/16/2011 0600   BUN 16 08/16/2011 0600   CREATININE 0.78 08/16/2011 0600   CREATININE 0.78 07/03/2011 0848   GLUCOSE 108* 08/16/2011 0600   CALCIUM 9.7 08/16/2011 0600      Assessment/Plan: 45 year old male with intermittent atypical chest pain concerning w/ significant negative cardiac workup.   1. Chest pain: Symptoms likely musculoskeletal due extensive negative workup. Cardiology following. Cards to perform myoview today. Pain possibly multifactorial in that pt also w/ known hiatal hernia w/ uncontrolled reflux. Possible some psychosomatic component as pt admitted earlier to lying about having not active chest pain in order to get bed placement.  Will continue ASA 81mg . Will continue w/ home vicodin and keep on morphine for breakthrough. Will continue with nitro to assess efficacy during episodes.   2. Hypokalemia: Improving. Pt near baseline. Chronic hypokalemic. Pt w/ continued K repleation w/ stable K this morning. Will continue w/ repleation of K-dur x3 today.    3. Hypertension: Patient BP better controlled after increasing Benazepril to 40mg  Qday. Will continue to monitor. Will consider adding spironolactone if needed.   4. FEN/GI: Currently NPO in anticipation of Myoview. Will restart meals after return. SLIV. Hiattal hernia noted in pt hx. Will continue Protonix 40 BID.   4. Prophylaxis: Subcutaneous heparin 5,000 units TID.   5. Disposition: Pending myoview and clearance by cardiology.      Signed: Shelly Flatten, MD Family Medicine Resident PGY-1 08/16/2011 9:46 AM

## 2011-08-16 NOTE — Progress Notes (Signed)
FMTS Attending Admission Note: Andrew Dunson MD [phone] pager office [phone] I  have seen and examined this patient, reviewed their chart. I have discussed this patient with the resident. I agree with the resident's findings, assessment and care plan. 

## 2011-08-16 NOTE — Progress Notes (Addendum)
Patient Name: Andrew Huerta Date of Encounter: 08/16/2011, 10:08 AM    Subjective  Patient seen briefly in nuclear stress room.   Pre Test: VSS. EKG shows NSR ICRBB no acute changes 65 bpm. Pt noted 2/10 CP this AM. During Test: Excellent exercise tolerance! Initially pt had slow increase of HR, then at end of stage 4 reached target HR. We injected Myoview around 12:10, pt able to complete a full minute of stage 5. Some dyspnea, no change in his baseline low-grade CP. BP increased with exercise up to 190 systolic, diastolic went up to 110's (threshold for stopping test is usually 220/120) but also varied into the 90's. I thought we were going to have to convert to Lexiscan due to BP limitation, but quickly reached target HR in stage 4. No acute EKG changes.  Post Test: BP still elevated but coming down. Diastolic still runs high. No CP. EKG has slight ST depression III, avF but no ST elevations, rhythm stable. He feels fine.  Await interpretation of images.   Objective   Telemetry: unable to review since patient is seen off the floor in nuc Physical Exam: Filed Vitals:   08/16/11 0500  BP: 137/86  Pulse: 62  Temp: 97.6 F (36.4 C)  Resp: 18   General: Well developed, well nourished, in no acute distress. Head: Normocephalic, atraumatic, sclera non-icteric, no xanthomas, nares are without discharge.  Neck: Negative for carotid bruits. JVD not elevated. Lungs: Clear bilaterally to auscultation without wheezes, rales, or rhonchi. Breathing is unlabored. Heart: RRR S1 S2 without murmurs, rubs, or gallops.  Abdomen: Soft, non-tender, non-distended with normoactive bowel sounds. No hepatomegaly. No rebound/guarding. No obvious abdominal masses. Msk:  Strength and tone appear normal for age. Extremities: No clubbing or cyanosis. No edema.  Distal pedal pulses are 2+ and equal bilaterally. Neuro: Alert and oriented X 3. Moves all extremities spontaneously. Psych:  Responds to  questions appropriately with a normal affect.    Intake/Output Summary (Last 24 hours) at 08/16/11 1008 Last data filed at 08/16/11 0835  Gross per 24 hour  Intake    240 ml  Output      0 ml  Net    240 ml    Labs:  Basename 08/16/11 0600 08/15/11 1524 08/14/11 1549  NA 138 141 --  K 3.3* 3.3* --  CL 99 101 --  CO2 28 30 --  GLUCOSE 108* 90 --  BUN 16 14 --  CREATININE 0.78 0.71 --  CALCIUM 9.7 9.7 --  MG -- -- 1.9  PHOS -- -- 3.0   No results found for this basename: AST:2,ALT:2,ALKPHOS:2,BILITOT:2,PROT:2,ALBUMIN:2 in the last 72 hours No results found for this basename: LIPASE:2,AMYLASE:2 in the last 72 hours  Basename 08/16/11 0600 08/14/11 1757  WBC 8.9 11.9*  NEUTROABS -- --  HGB 14.4 14.3  HCT 41.4 39.7  MCV 86.3 86.5  PLT 213 203    Basename 08/15/11 0958 08/14/11 2308 08/14/11 1549  CKTOTAL 52 55 48  CKMB 2.0 2.0 1.9  TROPONINI <0.30 <0.30 <0.30    Basename 08/15/11 0958  DDIMER 0.40   Radiology/Studies:  1. Chest Portable 1 View 08/14/2011  *RADIOLOGY REPORT*  Clinical Data: Left chest and arm pain, hypertension  PORTABLE CHEST - 1 VIEW  Comparison: Portable exam 1247 hours compared to 05/03/2008  Findings: Upper-normal size of cardiac silhouette. Mediastinal contours and pulmonary vascularity normal. Lungs clear. No pleural effusion or pneumothorax. Bones unremarkable.  IMPRESSION: No acute abnormalities.  Assessment and Plan  1. Chest pain, felt somewhat atypical - negative enzymes despite extended period of time. D-dimer also negative - not tachycardic/tachypnic & O2 sats normal. Await nuclear results. Patient exercised without change in CP - excellent exercise tolerance.  2. Barrett's esophagus ?contributing to #1.  3. Hypokalemia - would request that primary team replete K when patient returns to the floor.  Signed, Ronie Spies PA-C  Patient seen with PA, agree with the above note.  Negative cardiac enzymes, chest pain somewhat atypical.   Myoview done today, good exercise tolerance.  Await results.  If negative, ok to discharge from cardiology perspective.   Marca Ancona 08/16/2011 10:49 AM

## 2011-08-16 NOTE — Discharge Summary (Signed)
discontinued Family Medicine Resident Discharge Summary  Patient ID: Andrew Huerta [id number] 45 y.o. [birth year]  Admit date: 08/14/2011  Discharge date and time: 08/16/2011  4:55 PM   Admitting Physician: Leighton Roach McDiarmid, MD   Discharge Physician: Dr. Denny Levy  Admission Diagnoses: Chest pain [786.5] Hypokalemia [276.8] Chest pain  Discharge Diagnoses: Hypokalemia and non-cardiac chest pain  Admission Condition: good  Discharged Condition: good  Indication for Admission: Hypokalemia and chest pain  Hospital Course: Patient was admitted on 08/14/2011 after he was noted to have a potassium of 2.9 with acute onset of severe radiating chest pain. Patient with past medical history consisting of hypertension, hypokalemia, anxiety, hiatal hernia with associated Barrett's esophagus, carpal tunnel, chronic back and knee pain.  Chest pain: Patient received complete chest pain workup during hospitalization which included, among other measures,  2 normal EKGs, negative cardiac enzymes x4, normal Myoview stress test, normal chest x-ray. Patient noted to have multiple episodes of chest pain during hospitalization all of which were closely evaluated and treated. Cardiology was consult and during hospitalization, and signed off on day of discharge with the diagnosis of atypical chest pain. Labs and studies as above along with physical exam findings are consistent with noncardiac/musculoskeletal type chest pain. A  thorough discussion was undertaken with patient to ensure he understood signs and symptoms associated with various types of chest pain.  Hypokalemia: Patient noted to have chronic hypokalemia was treated outpatient with 20 mg acute or per day. Patient noted to be hypokalemic to 2.9. Patient's potassium was repleted throughout hospitalization and was back at baseline at time of discharge. Patient's last measured potassium of 3.3 was treated with a total of 120 mEq of potassium prior to  discharge with recommendation for follow potassium studies as outpatient.  HTN: Blood pressure was closely monitored throughout hospitalization. Patient noted to have persistently elevated blood pressure even on 4 drug therapy, which included amlodipine 10 mg daily, atenolol 50 mg BID, Benazepril 20 mg daily, HCTZ 25 mg daily. Patient's Benazepril was increased to 40 mg daily with improvement in blood pressure.  Consults: cardiology  Significant Diagnostic Studies:   CXR: WNL  K: 2.9 - 3.0 - 3.1 - 3.3 - 3.3  EKG: Normal sinus rhythm, Incomplete right bundle branch block, no significant change from prior EKGs  Lab Results  Component Value Date   CKTOTAL 52 08/15/2011   CKMB 2.0 08/15/2011   TROPONINI <0.30 08/15/2011   Myoview: 1. No evidence for pharmacologically induced myocardial ischemia.  2. Left ventricular ejection fraction equals 68%.  3. Normal left ventricular systolic function.  Discharge Exam: Gen: No acute distress, well-nourished well-developed  HEENT: Moist mucous membranes,  CV: Regular rate and rhythm, no murmurs rubs or gallops  Res: Clear to auscultation bilaterally normal effort  Abd: Normal active bowel sounds, nonpainful to palpation  Ext/Musc: 2+ pulses throughout, mild superficial L upper chest pain on palpation. .  Neuro: Cranial Nerves grossly intact  Disposition: home  Patient Instructions:   Medication List  As of 08/16/2011  8:32 PM   START taking these medications         aspirin 81 MG EC tablet   Take 1 tablet (81 mg total) by mouth daily.      benazepril 40 MG tablet   Commonly known as: LOTENSIN   Take 1 tablet (40 mg total) by mouth daily.      hydrochlorothiazide 25 MG tablet   Commonly known as: HYDRODIURIL   Take 1 tablet (25 mg  total) by mouth daily.         CONTINUE taking these medications         amLODipine 10 MG tablet   Commonly known as: NORVASC   Take 1 tablet (10 mg total) by mouth daily.      atenolol 50 MG tablet     Commonly known as: TENORMIN   Take 1 tablet (50 mg total) by mouth 2 (two) times daily.      HYDROcodone-acetaminophen 5-500 MG per tablet   Commonly known as: VICODIN      nitroGLYCERIN 0.4 MG SL tablet   Commonly known as: NITROSTAT      omeprazole 40 MG capsule   Commonly known as: PRILOSEC   Take 1 capsule (40 mg total) by mouth daily.      potassium chloride 10 MEQ tablet   Commonly known as: K-DUR   Take 2 tablets (20 mEq total) by mouth daily.         STOP taking these medications         benazepril-hydrochlorthiazide 20-25 MG per tablet            Activity: activity as tolerated Diet: regular diet Wound Care: none needed  Follow-up with Dr. Madolyn Frieze at [facility]. Pt to call and set up appointment  Follow-up Items: 1. Non-cardiac chest pain 2. Consider using spironolactone to assist w/ HTN and hypokalemia  Signed: Shelly Flatten, MD Family Medicine Resident PGY-1 08/16/2011 8:29 PM

## 2011-08-17 NOTE — Discharge Summary (Signed)
Family Medicine Teaching Service  Discharge Note : Attending Dayne Chait MD Pager [phone] Office [phone] I have seen and examined this patient, reviewed their chart and discussed discharge planning wit the resident at the time of discharge. I agree with the discharge plan as above.  

## 2011-08-22 ENCOUNTER — Ambulatory Visit (INDEPENDENT_AMBULATORY_CARE_PROVIDER_SITE_OTHER): Payer: Medicaid Other | Admitting: Family Medicine

## 2011-08-22 ENCOUNTER — Encounter: Payer: Self-pay | Admitting: Family Medicine

## 2011-08-22 VITALS — BP 128/83 | HR 90 | Temp 98.0°F | Ht 67.0 in | Wt 162.0 lb

## 2011-08-22 DIAGNOSIS — E876 Hypokalemia: Secondary | ICD-10-CM

## 2011-08-22 DIAGNOSIS — R079 Chest pain, unspecified: Secondary | ICD-10-CM

## 2011-08-22 LAB — BASIC METABOLIC PANEL
Chloride: 102 mEq/L (ref 96–112)
Potassium: 4.1 mEq/L (ref 3.5–5.3)
Sodium: 142 mEq/L (ref 135–145)

## 2011-08-22 MED ORDER — ESOMEPRAZOLE MAGNESIUM 40 MG PO CPDR: 40.0000 mg | DELAYED_RELEASE_CAPSULE | Freq: Every day | ORAL | Status: DC

## 2011-08-22 MED ORDER — HYDROCODONE-ACETAMINOPHEN 5-500 MG PO TABS: 1.0000 | ORAL_TABLET | Freq: Three times a day (TID) | ORAL | Status: DC | PRN

## 2011-08-22 NOTE — Progress Notes (Signed)
  Subjective:    Patient ID: Andrew Huerta, male    DOB: June 21, 1966, 45 y.o.   MRN: [id number]  HPI Hospital follow-up for chest pain attributed to musculoskeletal.  Improved but still present. 5/10 pain.  Alleviated by: pain medications (Vicodin) Exacerbated by: unsure; not sure if eating makes it better or worse  He did not know Nexium Rx was approved. Taking Prilosec currently which has historically not helped his GERD as much.  Review of Systems Denies headache.    Objective:   Physical Exam Gen: NAD Psych: engaged, appropriate CV: RRR, no m/r/g Chest: moderate TTP left chest and substernally Pulm: CTAB, no w/r/r Ext: no edema    Assessment & Plan:

## 2011-08-22 NOTE — Assessment & Plan Note (Signed)
Checking K today.

## 2011-08-22 NOTE — Patient Instructions (Addendum)
If your lab results are normal, I will send you a letter with the results. If abnormal, someone at the clinic will get in touch with you.   We will refer you back to Dr. Ewing Schlein for an evaluation of your Barrett's.  We will call you with the appointment date and time.  Call your pharmacy and ask them if the Nexium prescription is ready for you.   Follow-up with me in 1 month to follow-up on your chest pain, blood pressure, and potassium.

## 2011-08-22 NOTE — Assessment & Plan Note (Signed)
Persistent but not as severe as the pain he was having leading to his evaluation and hospitalization.  Wondering if this could be due to Barrett's. He was not aware PA for Nexium had gone through and had been taking Prilosec which historically does not help as much for GERD symptoms. Asked patient to start Nexium. Also, will refer back to Dr. Ewing Schlein for GI evaluation. Last saw Dr. Ewing Schlein several years ago around 2008.

## 2011-08-25 ENCOUNTER — Encounter: Payer: Self-pay | Admitting: Family Medicine

## 2011-08-31 ENCOUNTER — Other Ambulatory Visit: Payer: Self-pay | Admitting: Family Medicine

## 2011-09-01 NOTE — Telephone Encounter (Signed)
Refill request

## 2011-09-05 ENCOUNTER — Ambulatory Visit (INDEPENDENT_AMBULATORY_CARE_PROVIDER_SITE_OTHER): Payer: Medicaid Other | Admitting: Family Medicine

## 2011-09-05 ENCOUNTER — Encounter: Payer: Self-pay | Admitting: Family Medicine

## 2011-09-05 DIAGNOSIS — J029 Acute pharyngitis, unspecified: Secondary | ICD-10-CM

## 2011-09-05 LAB — POCT RAPID STREP A (OFFICE): Rapid Strep A Screen: NEGATIVE

## 2011-09-05 MED ORDER — CEFTRIAXONE SODIUM 1 G IJ SOLR: 1.0000 g | Freq: Once | INTRAMUSCULAR | Status: DC

## 2011-09-05 MED ORDER — CLINDAMYCIN HCL 300 MG PO CAPS: 300.0000 mg | ORAL_CAPSULE | Freq: Three times a day (TID) | ORAL | Status: DC

## 2011-09-05 MED ORDER — CEFTRIAXONE SODIUM 500 MG IJ SOLR: 500.0000 mg | Freq: Once | INTRAMUSCULAR | Status: DC

## 2011-09-05 MED ORDER — CEFTRIAXONE SODIUM 1 G IJ SOLR
1.0000 g | Freq: Once | INTRAMUSCULAR | Status: AC
  Administered 2011-09-05: 1 g via INTRAMUSCULAR

## 2011-09-05 NOTE — Patient Instructions (Signed)
It was good to see you today! I want you to take the medicine Clindamycin three times a day for 9 days beginning tomorrow morning. If you start running high fevers, have problems breathing, start having more problems swallowing, or other people start saying that your voice is changing more, please come back to see [place] or get in to urgent care.

## 2011-09-05 NOTE — Progress Notes (Signed)
Subjective: Patient presents today for or 4 days of sore throat. He reports no fevers, chills, cough, facial pain, or nasal discharge. He does report slight change in his voice. He also reports intolerance of solid food secondary to pain with swallowing. He is able to take fluids. No lightheadedness. No nausea vomiting.  Objective:  There were no vitals filed for this visit. Gen: No acute distress, mildly muffled voice. HEENT: Tympanic membranes clear bilaterally, no nasal discharge. Throat is very erythematous. Her bilateral tonsillar exudates. No soft palate swelling. There is lymphadenopathy bilaterally, left greater than right.  Assessment/Plan: While the patient's rapid strep test was negative, I am concerned about bacterial pharyngitis. With the slight reported change in his voice, and slightly muffled sounding voice on exam, I am concerned about developing retropharyngeal infection. I do not think that, at this point, further imaging is necessary but will give a dose of ceftriaxone and a nine-day course of clindamycin. Patient was carefully instructed on red flags for return.  Please also see individual problems in problem list for problem-specific plans.

## 2011-09-09 ENCOUNTER — Telehealth: Payer: Self-pay | Admitting: Family Medicine

## 2011-09-09 DIAGNOSIS — J029 Acute pharyngitis, unspecified: Secondary | ICD-10-CM

## 2011-09-09 MED ORDER — DOXYCYCLINE HYCLATE 100 MG PO TABS: 100.0000 mg | ORAL_TABLET | Freq: Two times a day (BID) | ORAL | Status: AC

## 2011-09-09 NOTE — Telephone Encounter (Signed)
Patient notified

## 2011-09-09 NOTE — Telephone Encounter (Signed)
Due to his recent history of low potassium despite supplements, will ask front desk to schedule him for lab visit end of this week for a BMET just to check.

## 2011-09-09 NOTE — Telephone Encounter (Signed)
Spoke with patient . States he started clindamycin on 01/05. On Sunday, 01/06, developed diarrhea 4-5 times daily. This has continued daily since tem. Denies sore throat now, voice seems ok also now. He is concerned about potassium  level with having diarrhea.  Does take 40 mEg potassium daily. Will send message to Dr. Louanne Belton since he saw patient on last visit. Andrew Huerta

## 2011-09-09 NOTE — Telephone Encounter (Signed)
Let's switch him over to doxycycline, 100mg  PO BID, stop the clinda, have him take twice his normal dose of KCl while he is still having diarrhea, and, if he is still having diarrhea by Thursday or Friday, to come in for a BMET.  Orders have been put in.

## 2011-09-09 NOTE — Telephone Encounter (Signed)
Pt was put on ABX and now cannot stop going to the bathroom.  Not sure what he should do.

## 2011-09-10 ENCOUNTER — Other Ambulatory Visit: Payer: Medicaid Other

## 2011-09-10 NOTE — Progress Notes (Signed)
Bmp done today Andrew Huerta 

## 2011-09-11 LAB — BASIC METABOLIC PANEL
CO2: 25 mEq/L (ref 19–32)
Chloride: 102 mEq/L (ref 96–112)
Glucose, Bld: 85 mg/dL (ref 70–99)
Potassium: 3.9 mEq/L (ref 3.5–5.3)
Sodium: 143 mEq/L (ref 135–145)

## 2011-09-23 ENCOUNTER — Ambulatory Visit (INDEPENDENT_AMBULATORY_CARE_PROVIDER_SITE_OTHER): Payer: Medicaid Other | Admitting: Family Medicine

## 2011-09-23 DIAGNOSIS — K2270 Barrett's esophagus without dysplasia: Secondary | ICD-10-CM

## 2011-09-23 DIAGNOSIS — I10 Essential (primary) hypertension: Secondary | ICD-10-CM

## 2011-09-23 DIAGNOSIS — R079 Chest pain, unspecified: Secondary | ICD-10-CM

## 2011-09-23 LAB — POCT URINALYSIS DIPSTICK
Bilirubin, UA: NEGATIVE
Glucose, UA: NEGATIVE
Ketones, UA: NEGATIVE
Leukocytes, UA: NEGATIVE
Nitrite, UA: NEGATIVE
Spec Grav, UA: 1.015

## 2011-09-23 LAB — POCT UA - MICROSCOPIC ONLY

## 2011-09-23 MED ORDER — PROMETHAZINE HCL 12.5 MG PO TABS: 12.5000 mg | ORAL_TABLET | Freq: Three times a day (TID) | ORAL | Status: DC | PRN

## 2011-09-23 MED ORDER — HYDROCODONE-ACETAMINOPHEN 5-500 MG PO TABS: 1.0000 | ORAL_TABLET | Freq: Three times a day (TID) | ORAL | Status: DC | PRN

## 2011-09-23 MED ORDER — EPLERENONE 50 MG PO TABS: 50.0000 mg | ORAL_TABLET | Freq: Every day | ORAL | Status: DC

## 2011-09-23 NOTE — Assessment & Plan Note (Signed)
>>  ASSESSMENT AND PLAN FOR OTHER GASTRITIS WITH BLEEDING WRITTEN ON 06/06/2024  2:44 PM BY ELICIA HAMLET, MD   >>ASSESSMENT AND PLAN FOR BARRETT'S ESOPHAGUS WRITTEN ON 09/23/2011  3:37 PM BY OH PARK, ANGELA J, MD  Would like him to be followed-up regarding this especially due to significant GERD and persistent chest pain. Seen by Dr. Rosalie. Patient currently unable to pay outstanding bill but thinks and hopes he can after his tax return comes back. Goal is to be seen in 6 months.

## 2011-09-23 NOTE — Assessment & Plan Note (Signed)
Would like him to be followed-up regarding this especially due to significant GERD and persistent chest pain. Seen by Dr. Ewing Schlein. Patient currently unable to pay outstanding bill but thinks and hopes he can after his tax return comes back. Goal is to be seen in 6 months.

## 2011-09-23 NOTE — Progress Notes (Signed)
  Subjective:    Patient ID: CAREY JOHNDROW, male    DOB: [birth year], 46 y.o.   MRN: [id number]  HPI Follow-up:  1. Chest pain. Left-sided, non-radiating. Continues. Occurs 7-8 times a week. Not associated with GERD or GI symptoms. Nexium does not help.  Medication: Vicodin taken for arthritis (which he doesn't take for chest pain) sometimes helps.  Exacerbated by: nothing, including activity  2. HTN Compliant with medications on current list. Stopped taking eplerenone. Thought he wasn't supposed to take any more. Unsure who removed from medication list.   3. Preventative Interested in colonoscopy   Review of Systems No difficulty breathing No numbness No headache    Objective:   Physical Exam Gen: NAD Psych: appropriate CV: RRR, no m/r/g Chest: moderate TTP left middle chest; no visual lesions including erythema or swelling or palpable lesions Pulm: CTAB without w/r/r Ext: no edema    Assessment & Plan:

## 2011-09-23 NOTE — Assessment & Plan Note (Signed)
>>  ASSESSMENT AND PLAN FOR BARRETT'S ESOPHAGUS WRITTEN ON 09/23/2011  3:37 PM BY OH PARK, ANGELA J, MD  Would like him to be followed-up regarding this especially due to significant GERD and persistent chest pain. Seen by Dr. Rosalie. Patient currently unable to pay outstanding bill but thinks and hopes he can after his tax return comes back. Goal is to be seen in 6 months.

## 2011-09-23 NOTE — Assessment & Plan Note (Addendum)
Persistent. 7-8 episodes a week. Sometimes very bothersome and painful.  Still appears to be MSK-->trying ibuprofen with phenergan to help with nausea prn. We want to try this to see if it helps.  May also be due to obstructing kidney stones? He had a non-obstructing left renal calculus in March 2012 CT-abdomen.-->Checking UA today.    UPDATE: Trace hemoglobin. On second thought, this is less likely considering pain has been going on for a while and if it existed before March when stone was non-obstructing, unlikely due to obstructing stone now. Will not pursue this further as etiology at this time.   Follow-up in 1 week. Do not want him to take ibuprofen for extended periods of time due to his Barrett's.

## 2011-09-23 NOTE — Patient Instructions (Addendum)
For your chest pain: Start ibuprofen 600 mg every 6 hours for the next week.  Follow-up in 1 week for this.  If you start noticing blood in your sputum, stop taking this medicine and let me know.  Let's also check your urine today to rule-out kidney stones causing this.   For your blood pressure, re-start eplerenone.  Please make an appointment with Dr. Ewing Schlein in the next 6 months to discuss your Barrett's.   Please schedule a colonoscopy.   I will see you next week!

## 2011-09-23 NOTE — Assessment & Plan Note (Signed)
Worse today. Stopped eplerenone. Not sure why. Re-starting today. He is currently on 5 anti-hypertensives. Have room to go up on benazepril (max dose 80 mg daily) and eplerenone (100 mg daily).

## 2011-10-01 ENCOUNTER — Encounter: Payer: Self-pay | Admitting: Family Medicine

## 2011-10-01 ENCOUNTER — Ambulatory Visit (INDEPENDENT_AMBULATORY_CARE_PROVIDER_SITE_OTHER): Payer: Medicaid Other | Admitting: Family Medicine

## 2011-10-01 DIAGNOSIS — I10 Essential (primary) hypertension: Secondary | ICD-10-CM

## 2011-10-01 DIAGNOSIS — R079 Chest pain, unspecified: Secondary | ICD-10-CM

## 2011-10-01 MED ORDER — AMITRIPTYLINE HCL 50 MG PO TABS: 50.0000 mg | ORAL_TABLET | Freq: Every day | ORAL | Status: DC

## 2011-10-01 NOTE — Patient Instructions (Signed)
For your blood pressure: Stop taking the Norvasc. You should be on 3 different blood pressure medications now: atenolol (twice a day), benazepril, and HCTZ. Please measure your blood pressures at least twice a week and bring this record with you to your next office visit.   For your chest pain: Let's try the amitriptyline at bedtime.   We will give you information about colonoscopy today.  Follow-up in 2 weeks. We will check your potassium at that time.

## 2011-10-01 NOTE — Assessment & Plan Note (Addendum)
Persistent. Could not tolerate side effects of ibuprofen.  Patient presenting with symptoms of depression. Will try amitriptyline to see if it helps. Discussed this with patient who is amenable. (NB: TSH WNL 05/2011).  Will follow-up in 2 weeks.
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
## 2011-10-01 NOTE — Assessment & Plan Note (Signed)
Blood pressure on lower side today. 100s/60s.  Patient not taking eplerenone (never started).  Will stop Norvasc today. Continue atenolol, benazepril, and HCTZ.  Patient will record blood pressures at least twice a week for the next 2 weeks.  Follow-up in 2 weeks.

## 2011-10-09 ENCOUNTER — Ambulatory Visit: Payer: Medicaid Other | Admitting: Family Medicine

## 2011-10-16 ENCOUNTER — Encounter: Payer: Self-pay | Admitting: Family Medicine

## 2011-10-16 ENCOUNTER — Ambulatory Visit (INDEPENDENT_AMBULATORY_CARE_PROVIDER_SITE_OTHER): Payer: Medicaid Other | Admitting: Family Medicine

## 2011-10-16 DIAGNOSIS — E876 Hypokalemia: Secondary | ICD-10-CM

## 2011-10-16 DIAGNOSIS — K2270 Barrett's esophagus without dysplasia: Secondary | ICD-10-CM

## 2011-10-16 DIAGNOSIS — R079 Chest pain, unspecified: Secondary | ICD-10-CM

## 2011-10-16 DIAGNOSIS — I10 Essential (primary) hypertension: Secondary | ICD-10-CM

## 2011-10-16 MED ORDER — SPIRONOLACTONE 50 MG PO TABS: 25.0000 mg | ORAL_TABLET | Freq: Every day | ORAL | Status: DC

## 2011-10-16 MED ORDER — POTASSIUM CHLORIDE CRYS ER 20 MEQ PO TBCR: 20.0000 meq | EXTENDED_RELEASE_TABLET | Freq: Every day | ORAL | Status: DC
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
## 2011-10-16 NOTE — Patient Instructions (Signed)
Stop spironolactone for your blood pressure. Follow-up in 2 weeks. Measure your blood pressures at least twice a week at home. If low and you get lightheaded, please let me know.  Stop amitriptyline.  We will refer you to Dr. Marlane Hatcher office.  It was nice to see you and meet your mother today.

## 2011-10-17 LAB — BASIC METABOLIC PANEL
BUN: 19 mg/dL (ref 6–23)
Creat: 0.93 mg/dL (ref 0.50–1.35)
Glucose, Bld: 123 mg/dL — ABNORMAL HIGH (ref 70–99)

## 2011-10-17 NOTE — Assessment & Plan Note (Signed)
Amitriptyline did not help for possible psychiatric (depression, anxiety) component to chest pain.  Discontinuing due to not helping and side effects (dry mouth).  Will refer back to Dr. Ewing Schlein (GI) to follow-up on Barret's now that patient has paid outstanding bill.

## 2011-10-17 NOTE — Progress Notes (Signed)
Addended by: Priscella Mann J on: 10/17/2011 12:31 PM   Modules accepted: Orders

## 2011-10-17 NOTE — Assessment & Plan Note (Addendum)
Blood pressure elevated today. Reports elevated BPs at home. Will start eplerenone. Continue atenolol (maxed), benazepril 40 (max dose 80), HCTZ 25 (max dose 50). Follow-up in 2 weeks.

## 2011-10-17 NOTE — Assessment & Plan Note (Addendum)
Checking today since starting eplerenone.  UPDATE: Low! Don't know why. Patient compliant with potassium and no change in other medications since last check when it was normal. Called patient and asked him to double current potassium dose (from 20 to 40 mEq daily) for 1 week, come in next week on Wednesday or Thursday for lab visit. Will call patient and discuss results. Patient will start spironolactone today, so hopefully this will also help with her hypokalemia.

## 2011-10-22 ENCOUNTER — Other Ambulatory Visit: Payer: Medicaid Other

## 2011-10-22 DIAGNOSIS — E876 Hypokalemia: Secondary | ICD-10-CM

## 2011-10-22 LAB — BASIC METABOLIC PANEL
BUN: 20 mg/dL (ref 6–23)
Calcium: 9.2 mg/dL (ref 8.4–10.5)
Potassium: 3.7 mEq/L (ref 3.5–5.3)
Sodium: 143 mEq/L (ref 135–145)

## 2011-10-23 ENCOUNTER — Telehealth: Payer: Self-pay | Admitting: Family Medicine

## 2011-10-23 DIAGNOSIS — E876 Hypokalemia: Secondary | ICD-10-CM

## 2011-10-23 NOTE — Telephone Encounter (Signed)
Discussed K of 3.7 Patient still taking doubled dose ( ) of K daily. Asked him to continue for next 4 days. Patient will see me in 7 days. (02/28). Will come in on 02/27 for a lab visit to re-check K.

## 2011-10-27 ENCOUNTER — Other Ambulatory Visit: Payer: Medicaid Other

## 2011-10-27 DIAGNOSIS — E876 Hypokalemia: Secondary | ICD-10-CM

## 2011-10-27 LAB — BASIC METABOLIC PANEL
CO2: 29 mEq/L (ref 19–32)
Calcium: 9.8 mg/dL (ref 8.4–10.5)
Glucose, Bld: 126 mg/dL — ABNORMAL HIGH (ref 70–99)
Potassium: 4.1 mEq/L (ref 3.5–5.3)
Sodium: 141 mEq/L (ref 135–145)

## 2011-10-27 NOTE — Progress Notes (Signed)
BMP DONE TODAY Andrew Huerta 

## 2011-10-29 ENCOUNTER — Other Ambulatory Visit: Payer: Medicaid Other

## 2011-10-30 ENCOUNTER — Encounter: Payer: Self-pay | Admitting: Family Medicine

## 2011-10-30 ENCOUNTER — Ambulatory Visit (INDEPENDENT_AMBULATORY_CARE_PROVIDER_SITE_OTHER): Payer: Medicaid Other | Admitting: Family Medicine

## 2011-10-30 VITALS — BP 153/104 | HR 77 | Temp 98.1°F | Ht 67.0 in | Wt 173.0 lb

## 2011-10-30 DIAGNOSIS — F411 Generalized anxiety disorder: Secondary | ICD-10-CM

## 2011-10-30 DIAGNOSIS — E876 Hypokalemia: Secondary | ICD-10-CM

## 2011-10-30 DIAGNOSIS — I10 Essential (primary) hypertension: Secondary | ICD-10-CM

## 2011-10-30 MED ORDER — SPIRONOLACTONE 50 MG PO TABS: 50.0000 mg | ORAL_TABLET | Freq: Every day | ORAL | Status: DC

## 2011-10-30 MED ORDER — HYDROCODONE-ACETAMINOPHEN 5-500 MG PO TABS: 1.0000 | ORAL_TABLET | Freq: Three times a day (TID) | ORAL | Status: DC | PRN

## 2011-10-30 MED ORDER — POTASSIUM CHLORIDE CRYS ER 20 MEQ PO TBCR: 40.0000 meq | EXTENDED_RELEASE_TABLET | Freq: Every day | ORAL | Status: DC

## 2011-10-30 NOTE — Progress Notes (Signed)
  Subjective:    Patient ID: Andrew Huerta, male    DOB: Aug 14, 1966, 46 y.o.   MRN: [id number]  HPI Follow-up: blood pressure and hypokalemia  BP remains elevated.  Checks outside clinic a few times a week, 150-160s usually.  Compliant with 4 medications.   Review of Systems Complaining of headaches after taking medications  Chest pain unchanged; appointment with Dr. Ewing Schlein 03/07    Objective:   Physical Exam Gen: NAD CV: RRR without m/r/g Pulm: CTAB without w/r/r Ext: no edema     Assessment & Plan:

## 2011-10-30 NOTE — Patient Instructions (Signed)
For your blood pressure: take 1 full tablet of spironolactone (50 mg) now. Take atenolol, spironolactone, and HCTZ in the morning. Take atenolol and benazepril in the evening.   Come to lab 1-2 days before your next visit to check your potassium. Follow-up in 2 weeks.  I appreciate your confiding in me. I hope you will try having a conversation with God. I will be praying for you.

## 2011-10-31 NOTE — Assessment & Plan Note (Signed)
K has been going up slight but in normal range on 40 mEq of KCl. Will decrease to 30 mEq as anticipate possible elevation with increased dosage of spironolactone.   Will re-check in 2 weeks.

## 2011-10-31 NOTE — Assessment & Plan Note (Signed)
Remains elevated despite starting spironolactone.  Patient reports good compliance with medications, and I believe him.  Will increase dose of spironolactone. Follow-up in 2 weeks.
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
## 2011-11-05 ENCOUNTER — Other Ambulatory Visit: Payer: Self-pay

## 2011-11-05 ENCOUNTER — Telehealth: Payer: Self-pay | Admitting: Family Medicine

## 2011-11-05 ENCOUNTER — Ambulatory Visit (HOSPITAL_COMMUNITY)
Admission: RE | Admit: 2011-11-05 | Discharge: 2011-11-05 | Disposition: A | Discharge status: Home / Self Care | Payer: Medicaid Other | Source: Ambulatory Visit | Attending: Family Medicine | Admitting: Family Medicine

## 2011-11-05 ENCOUNTER — Encounter: Payer: Self-pay | Admitting: Family Medicine

## 2011-11-05 ENCOUNTER — Ambulatory Visit (INDEPENDENT_AMBULATORY_CARE_PROVIDER_SITE_OTHER): Payer: Medicaid Other | Admitting: Family Medicine

## 2011-11-05 VITALS — BP 160/102 | HR 84 | Temp 98.1°F | Ht 67.0 in | Wt 172.9 lb

## 2011-11-05 DIAGNOSIS — R7309 Other abnormal glucose: Secondary | ICD-10-CM

## 2011-11-05 DIAGNOSIS — R739 Hyperglycemia, unspecified: Secondary | ICD-10-CM

## 2011-11-05 DIAGNOSIS — I10 Essential (primary) hypertension: Secondary | ICD-10-CM

## 2011-11-05 DIAGNOSIS — I4510 Unspecified right bundle-branch block: Secondary | ICD-10-CM | POA: Insufficient documentation

## 2011-11-05 DIAGNOSIS — R51 Headache: Secondary | ICD-10-CM

## 2011-11-05 DIAGNOSIS — E876 Hypokalemia: Secondary | ICD-10-CM

## 2011-11-05 MED ORDER — PROMETHAZINE HCL 25 MG/ML IJ SOLN
12.5000 mg | Freq: Once | INTRAMUSCULAR | Status: AC
  Administered 2011-11-05: 12.5 mg via INTRAMUSCULAR

## 2011-11-05 MED ORDER — KETOROLAC TROMETHAMINE 60 MG/2ML IM SOLN
60.0000 mg | Freq: Once | INTRAMUSCULAR | Status: AC
  Administered 2011-11-05: 60 mg via INTRAMUSCULAR

## 2011-11-05 MED ORDER — SPIRONOLACTONE 50 MG PO TABS: 100.0000 mg | ORAL_TABLET | Freq: Every day | ORAL | Status: DC

## 2011-11-05 NOTE — Telephone Encounter (Signed)
Patient is calling about elevation in his BP before and after taking his meds ranging over a 2 hour period of time.  He would like to speak to a nurse.

## 2011-11-05 NOTE — Telephone Encounter (Signed)
Patient has been taking his meds as prescribed from his office visit last week and BP is still elevated.  178/113  at 8:00 am and 182/115 at 10:45 am.  States he has a slight headache.  Appt given for today @ 1:30 pm with Dr. Katrinka Blazing.   Gaylene Brooks, RN

## 2011-11-05 NOTE — Assessment & Plan Note (Signed)
Patient is a young male for having such severe hypertension. With patient having associated hypokalemia concern for a primary or secondary hyperaldosteronism. At this time we will get a serum renin and aldosterone as well as the ratio. Considering what we find at that point we then we'll consider changing medications around as well as potential imaging. Patient will be seeing me again in one week's time. We have increased his prophylactic to 200 mg daily and we'll get a new seen today. In addition we'll have patient stop his potassium supplementation at this time. We will also have patient come back in a week and at that time we will get another basic metabolic panel.

## 2011-11-05 NOTE — Progress Notes (Signed)
Addended by: Garen Grams F on: 11/05/2011 04:31 PM   Modules accepted: Orders

## 2011-11-05 NOTE — Telephone Encounter (Signed)
I saw the patient please see the note. I am concerned that patient may have a primary hyperaldosteronism and we are getting labs.

## 2011-11-05 NOTE — Patient Instructions (Signed)
Nice to meet you. We are going to check some labs today. I will call you with the results Your EKG is normal. I when she to increase her spironolactone to 100 mg daily and stopped your potassium supplement. I want you to come back early next week and see Dr. Madolyn Frieze or myself.

## 2011-11-05 NOTE — Assessment & Plan Note (Signed)
As stated above with hypertension. Very concerned for been potential primary hyperaldosteronism. Increase prolactin decrease supplementation patient's potassium was 4.1.

## 2011-11-05 NOTE — Progress Notes (Signed)
  Subjective:    Patient ID: Andrew Huerta, male    DOB: 12-02-1965, 46 y.o.   MRN: [id number]  HPI 1. Hypertension Blood pressure at home: At home was taken his blood pressure and his systolic blood pressures in the 170s to 180s. Blood pressure today: 160/102 Taking Meds: Yes patient was started as per like to not to not too long ago by his PCP Side effects: No patient continues to have headaches as well as chest pain patient has been seen in the emergency department where workup was negative for chest pain. ROS: Deniesvisual changes or abdominal pain or shortness of breath. Positive for headache visual changes nausea and vomiting. Lab Results  Component Value Date   NA 141 10/27/2011   K 4.1 10/27/2011   CL 102 10/27/2011   CO2 29 10/27/2011      Review of Systems As stated in history of present illness    Objective:   Physical Exam  Constitutional: He is oriented to person, place, and time. He appears well-developed.  HENT:  Head: Normocephalic.  Mouth/Throat: Oropharynx is clear and moist.  Eyes: Conjunctivae and EOM are normal. Pupils are equal, round, and reactive to light.  Neck: Normal range of motion. Neck supple. No thyromegaly present.  Cardiovascular: Normal rate, regular rhythm and normal heart sounds.   No murmur heard. Pulmonary/Chest: Effort normal and breath sounds normal.  Abdominal: Soft. Bowel sounds are normal. He exhibits no mass. There is no guarding.       No bruits heard over renal arteries.  Musculoskeletal: Normal range of motion.  Neurological: He is alert and oriented to person, place, and time. He has normal reflexes. No cranial nerve deficit.  Skin: Skin is warm and dry.  Psychiatric: He has a normal mood and affect.   EKG reviewed today normal.  Assessment & Plan:

## 2011-11-06 ENCOUNTER — Ambulatory Visit (INDEPENDENT_AMBULATORY_CARE_PROVIDER_SITE_OTHER): Payer: Medicaid Other | Admitting: Family Medicine

## 2011-11-06 ENCOUNTER — Telehealth: Payer: Self-pay | Admitting: Family Medicine

## 2011-11-06 ENCOUNTER — Ambulatory Visit (HOSPITAL_COMMUNITY)
Admission: RE | Admit: 2011-11-06 | Discharge: 2011-11-06 | Disposition: A | Discharge status: Home / Self Care | Payer: Medicaid Other | Source: Ambulatory Visit | Attending: Family Medicine | Admitting: Family Medicine

## 2011-11-06 ENCOUNTER — Other Ambulatory Visit: Payer: Self-pay

## 2011-11-06 DIAGNOSIS — I10 Essential (primary) hypertension: Secondary | ICD-10-CM

## 2011-11-06 DIAGNOSIS — R079 Chest pain, unspecified: Secondary | ICD-10-CM | POA: Insufficient documentation

## 2011-11-06 DIAGNOSIS — R7309 Other abnormal glucose: Secondary | ICD-10-CM

## 2011-11-06 DIAGNOSIS — R739 Hyperglycemia, unspecified: Secondary | ICD-10-CM

## 2011-11-06 LAB — COMPREHENSIVE METABOLIC PANEL
ALT: 21 U/L (ref 0–53)
AST: 16 U/L (ref 0–37)
CO2: 26 mEq/L (ref 19–32)
Chloride: 101 mEq/L (ref 96–112)
Creat: 0.79 mg/dL (ref 0.50–1.35)
Sodium: 140 mEq/L (ref 135–145)
Total Bilirubin: 0.5 mg/dL (ref 0.3–1.2)
Total Protein: 7.1 g/dL (ref 6.0–8.3)

## 2011-11-06 MED ORDER — AMLODIPINE BESYLATE 10 MG PO TABS: 10.0000 mg | ORAL_TABLET | Freq: Every day | ORAL | Status: DC

## 2011-11-06 NOTE — Telephone Encounter (Signed)
Changed dose on BP meds and took it an hour ago and reading is 183/123 pulse- 83 Needs to know if he needs to do anything,

## 2011-11-06 NOTE — Progress Notes (Signed)
Addended by: Judi Saa on: 11/06/2011 10:07 AM   Modules accepted: Orders

## 2011-11-06 NOTE — Progress Notes (Signed)
  Subjective:    Patient ID: Andrew Huerta, male    DOB: 27-Jan-1966, 46 y.o.   MRN: [id number]  Hypertension  1. Chest pain Left sided chest pain, comes and goes, no elliciting factors, not substernal, no crushing, pain is moderate to mild, pain radiates down left arm and left jaw. Associated with walking up gasping for air at night and GERD with history of barrets and hiatal hernia. Stress test done recently in hospital negative. EKG today negative - yesterday negative. No back pain or tearing pain. No abdominal or epigastric pain.   2.Hypertension Blood pressure at home: At home was taken his blood pressure and his systolic blood pressures in the 170s to 180s. Blood pressure today:  BP Readings from Last 3 Encounters:  11/06/11 169/102  11/05/11 160/102  10/30/11 153/104   Taking Meds: yes (all four) Side effects: polyuria ROS: Denies visual changes or abdominal pain or shortness of breath. Positive for headache visual changes nausea and vomiting.  Review of Systems Pertinent items are noted in HPI. No fever, chills, night sweats, weight loss.     Objective:   Physical Exam  Pulmonary/Chest: Effort normal and breath sounds normal.  Abdominal:       No bruits heard over renal arteries.  Psychiatric: He has a normal mood and affect.   EKG reviewed today normal. Filed Vitals:   11/06/11 0919 11/06/11 0920  BP: 169/107 169/102  Pulse: 71 71  Height: 5\' 7"  (1.702 m) 5\' 7"  (1.702 m)  Weight: 172 lb (78.019 kg) 172 lb (78.019 kg)   Assessment & Plan:

## 2011-11-06 NOTE — Assessment & Plan Note (Signed)
Most likely GERD - will continue with Nexium daily. Trying Mylanta and cimetidine.  Cardiac - negative ekg, atypical presentation, negative stress test done in hospital. No MSK symptoms. Lungs wnl on exam/history.

## 2011-11-06 NOTE — Patient Instructions (Signed)
It was great to see you today!  Keep your appointment with Dr. Katrinka Blazing or Dr. Madolyn Frieze on the 13th/15th.  You have chest pain that I believe is related to GERD - reflux - follow up with your GI doctor today.  We will call you about your lab results.  I have started you on Amlodipine 10 mg daily for your BP. I have stopped your diuretics because 1. Your potassium drops 2. You are stopping the blood pressure effects by drinking a lot of water.

## 2011-11-06 NOTE — Telephone Encounter (Signed)
Patient states he woke up this AM with headache, chest pain. Took new dose of medication  Spironolactone as directed yesterday by Dr. Katrinka Blazing. Has stopped potassium. Since he felt bad he checked BP . Also  now feels some nausea.  Consulted with Dr. Deirdre Priest and he advises patient needs to be seen in our office now. Appointment scheduled. He will be here in 10 minutes.

## 2011-11-06 NOTE — Assessment & Plan Note (Signed)
hgA1 c today: 5.7 Family history of DM He has 198 glucose on last BMP yesterday.  C/O polyuria - on two diuretics.

## 2011-11-06 NOTE — Assessment & Plan Note (Signed)
Reviewed last provider notes.  For PCP: follow up hyperaldosteronism labs  I dc'd his diuretics because he gets hypokalemia and he has polyuria without beneficial drop in BP. I added Amlodipine 10 mg daily - he will continue his atenolol BID (consider changing to Metoprolol 50 mg daily) and his benazepril.  He will follow up for a BP check as scheduled.

## 2011-11-12 ENCOUNTER — Other Ambulatory Visit: Payer: Medicaid Other

## 2011-11-12 ENCOUNTER — Ambulatory Visit: Payer: Medicaid Other | Admitting: Family Medicine

## 2011-11-12 DIAGNOSIS — E876 Hypokalemia: Secondary | ICD-10-CM

## 2011-11-12 LAB — BASIC METABOLIC PANEL
Calcium: 9.5 mg/dL (ref 8.4–10.5)
Glucose, Bld: 151 mg/dL — ABNORMAL HIGH (ref 70–99)
Sodium: 141 mEq/L (ref 135–145)

## 2011-11-12 NOTE — Progress Notes (Signed)
Bmp done today Othar Curto 

## 2011-11-14 ENCOUNTER — Ambulatory Visit (INDEPENDENT_AMBULATORY_CARE_PROVIDER_SITE_OTHER): Payer: Medicaid Other | Admitting: Family Medicine

## 2011-11-14 ENCOUNTER — Encounter: Payer: Self-pay | Admitting: Family Medicine

## 2011-11-14 VITALS — BP 168/97 | HR 87 | Temp 98.1°F | Ht 67.0 in | Wt 171.0 lb

## 2011-11-14 DIAGNOSIS — R519 Headache, unspecified: Secondary | ICD-10-CM | POA: Insufficient documentation

## 2011-11-14 DIAGNOSIS — R079 Chest pain, unspecified: Secondary | ICD-10-CM

## 2011-11-14 DIAGNOSIS — R7309 Other abnormal glucose: Secondary | ICD-10-CM

## 2011-11-14 DIAGNOSIS — R739 Hyperglycemia, unspecified: Secondary | ICD-10-CM

## 2011-11-14 DIAGNOSIS — I10 Essential (primary) hypertension: Secondary | ICD-10-CM

## 2011-11-14 DIAGNOSIS — E876 Hypokalemia: Secondary | ICD-10-CM

## 2011-11-14 DIAGNOSIS — R51 Headache: Secondary | ICD-10-CM | POA: Insufficient documentation

## 2011-11-14 MED ORDER — METOPROLOL SUCCINATE ER 100 MG PO TB24: 200.0000 mg | ORAL_TABLET | Freq: Every day | ORAL | Status: DC

## 2011-11-14 MED ORDER — SUMATRIPTAN SUCCINATE 50 MG PO TABS: 50.0000 mg | ORAL_TABLET | ORAL | Status: DC | PRN

## 2011-11-14 MED ORDER — CARVEDILOL 12.5 MG PO TABS: 12.5000 mg | ORAL_TABLET | Freq: Two times a day (BID) | ORAL | Status: DC

## 2011-11-14 MED ORDER — POTASSIUM CHLORIDE CRYS ER 20 MEQ PO TBCR: 20.0000 meq | EXTENDED_RELEASE_TABLET | Freq: Every day | ORAL | Status: DC

## 2011-11-14 NOTE — Progress Notes (Signed)
Addended by: Madolyn Frieze, Marylene Land J on: 11/14/2011 01:11 PM   Modules accepted: Orders

## 2011-11-14 NOTE — Assessment & Plan Note (Signed)
Mildly low at 3.4 off diuretics and potassium. Asked patient to take smaller dose of potassium ( daily) for now. Re-check labs in a few days.

## 2011-11-14 NOTE — Patient Instructions (Addendum)
For your headaches, try the Imitrex. Go to the ED if you have any of those worrisome symptoms we talked about.  For your blood pressure, I will change your atenolol to carvedilol.  I am referring you to a kidney specialist. We will call you with the appointment date and time.   Make an appointment for Monday for a nurse visit to check your blood pressure and for a lab visit to check up on your potassium.  Make an appointment with me next week to discuss your blood pressure and headaches. (FRONT OFFICE: MAY DOUBLE BOOK ME AGAIN).   It was nice to see you again.

## 2011-11-14 NOTE — Assessment & Plan Note (Addendum)
BP remains elevated. Several medication changes made recently by other providers who have seen patient. No longer on diuretics or potassium. Patient has had refractory hypertension and hypokalemia for several years. There was concern for hyperaldosteronism about 6 months ago, however, renin/aldosterone WNL at 16.8 (normal limits: <20). However, recent renin/aldosterone was markedly elevated at 183.3. Patient continues to have normal renal function. Will refer to nephrology for work-up of possible primary hyperaldosteronism.  Will change atenolol to carvedilol.  If BP remains elevated, consider adding spironolactone.

## 2011-11-14 NOTE — Progress Notes (Signed)
  Subjective:    Patient ID: Andrew Huerta, male    DOB: [birth year], 46 y.o.   MRN: [id number]  HPI Follow-up:  1. HTN, low K Seen 2 providers since the last time I saw him a few weeks ago.  Few changes to his medications. No longer on diuretics or potassium.  Compliant with medications ROS: headache (see below), chest pain unchanged from usual chest pain and of moderate intensity  2. Headache Started about 1 month ago. Bifrontal headache. No other neuro deficits including: weakness, vision/hearing changes. Associated symptoms: no photophobia, nausea/vomiting Medications tried: Excedrin headache/migraine (does not help, irritated stomach); Vicodin for arthritis does not seem to help.  Review of Systems Per HPI    Objective:   Physical Exam Gen: NAD Psych: pleasant, engaged, appropriate, appears mildly chronically anxious HEENT: PERRL Neuro: CN grossly intact; moves all extremities; ambulates with slight limp (history of osteoarthritis of knees); 5/5 strength upper and lower extremities; normal finger-to-nose CV: RRR, no m/r/g Pulm: CTAB without w/r/r Ext: no edema    Assessment & Plan:  r your headaches, try the Imitrex. Go to the ED if you have any of those worrisome symptoms we talked about.  For your blood pressure, I will change your atenolol to metoprolol.  I am referring you to a kidney specialist. We will call you with the appointment date and time.   Make an appointment for Monday for a nurse visit to check your blood pressure and for a lab visit to check up on your potassium.  Make an appointment with me next week to discuss your blood pressure and headaches. (FRONT OFFICE: MAY DOUBLE BOOK ME AGAIN).   It was nice to see you again.

## 2011-11-14 NOTE — Assessment & Plan Note (Addendum)
For the past month. May be related to high blood pressures, although this has been going on for a lot longer.  Would be atypical for migraine but will try Imitrex to see if it helps. Patient given red flags to go ED. Follow-up in 1 week.

## 2011-11-17 ENCOUNTER — Telehealth: Payer: Self-pay | Admitting: Family Medicine

## 2011-11-17 ENCOUNTER — Ambulatory Visit (INDEPENDENT_AMBULATORY_CARE_PROVIDER_SITE_OTHER): Payer: Medicaid Other | Admitting: *Deleted

## 2011-11-17 ENCOUNTER — Other Ambulatory Visit: Payer: Medicaid Other

## 2011-11-17 VITALS — BP 160/104 | HR 80

## 2011-11-17 DIAGNOSIS — I10 Essential (primary) hypertension: Secondary | ICD-10-CM

## 2011-11-17 DIAGNOSIS — E876 Hypokalemia: Secondary | ICD-10-CM

## 2011-11-17 LAB — BASIC METABOLIC PANEL
BUN: 18 mg/dL (ref 6–23)
CO2: 30 mEq/L (ref 19–32)
Glucose, Bld: 120 mg/dL — ABNORMAL HIGH (ref 70–99)
Potassium: 3.5 mEq/L (ref 3.5–5.3)
Sodium: 142 mEq/L (ref 135–145)

## 2011-11-17 LAB — LIPID PANEL
Cholesterol: 235 mg/dL — ABNORMAL HIGH (ref 0–200)
LDL Cholesterol: 161 mg/dL — ABNORMAL HIGH (ref 0–99)
Total CHOL/HDL Ratio: 4.3 Ratio
VLDL: 19 mg/dL (ref 0–40)

## 2011-11-17 MED ORDER — SPIRONOLACTONE 25 MG PO TABS: 25.0000 mg | ORAL_TABLET | Freq: Every day | ORAL | Status: DC

## 2011-11-17 NOTE — Telephone Encounter (Signed)
Called patient, left message. Adding spironolactone. Asked patient to call clinic and let me know he got my message.

## 2011-11-17 NOTE — Progress Notes (Addendum)
Patient in for BP check. BP checked manually using regular adult cuff.   BP LA 160/108 and RA 160/104.  Pulse 80. Patient states medications have been changed 3 times recently .  He  knows that MD changed  a med at last visit but cannot name the medication. Does not know names of other meds he is taking for BP.  Explained to patient that it is always best to bring meds to visit so we can check and make sure he is taking everything we  have down that he is taking. States all he was coming in for was BP  and labs didn't know he had to name his meds.  Dr. Deirdre Priest notified of  BP reading today. Has appointment with Dr. Armen Pickup 11/19/2011.    This is an [employer] patient. He has an appointment with [place] Park on 11/19/11. Gerilyn Nestle Funches

## 2011-11-17 NOTE — Progress Notes (Signed)
flp and bmp done today Andrew Huerta 

## 2011-11-19 ENCOUNTER — Encounter: Payer: Medicaid Other | Admitting: Family Medicine

## 2011-11-19 ENCOUNTER — Encounter: Payer: Self-pay | Admitting: Family Medicine

## 2011-11-19 ENCOUNTER — Other Ambulatory Visit: Payer: Self-pay | Admitting: Nephrology

## 2011-11-19 DIAGNOSIS — I10 Essential (primary) hypertension: Secondary | ICD-10-CM

## 2011-11-20 LAB — ALDOSTERONE + RENIN ACTIVITY W/ RATIO
ALDO / PRA Ratio: 183.3 Ratio — ABNORMAL HIGH (ref 0.9–28.9)
Aldosterone: 11 ng/dL

## 2011-11-20 NOTE — Progress Notes (Signed)
This encounter was created in error - please disregard.

## 2011-11-21 ENCOUNTER — Other Ambulatory Visit: Payer: Medicaid Other

## 2011-11-21 ENCOUNTER — Other Ambulatory Visit: Payer: Self-pay | Admitting: Gastroenterology

## 2011-11-27 ENCOUNTER — Encounter: Payer: Self-pay | Admitting: Family Medicine

## 2011-11-27 DIAGNOSIS — I10 Essential (primary) hypertension: Secondary | ICD-10-CM

## 2011-11-27 NOTE — Assessment & Plan Note (Signed)
Documentation only. Patient seen by nephrologist Dr. Kathrene Bongo for initial visit 03/20. Due to concern for primary hyperaldosteronism. PLAN:   -CT-adrenals. Consider adrenal vein sampling.    -Titrate spironolactone   -Follow-up 3 weeks

## 2011-12-05 ENCOUNTER — Ambulatory Visit
Admission: RE | Admit: 2011-12-05 | Discharge: 2011-12-05 | Disposition: A | Discharge status: Home / Self Care | Payer: Medicaid Other | Source: Ambulatory Visit | Attending: Nephrology | Admitting: Nephrology

## 2011-12-05 DIAGNOSIS — I10 Essential (primary) hypertension: Secondary | ICD-10-CM

## 2011-12-08 ENCOUNTER — Encounter: Payer: Self-pay | Admitting: Family Medicine

## 2011-12-08 DIAGNOSIS — K2270 Barrett's esophagus without dysplasia: Secondary | ICD-10-CM

## 2011-12-08 NOTE — Assessment & Plan Note (Signed)
>>  ASSESSMENT AND PLAN FOR BARRETT'S ESOPHAGUS WRITTEN ON 12/08/2011 10:54 AM BY [place] P[place], ANGELA J, MD  Documentation only. Dr. Rosalie recommending prn follow-up for stable Barrett's.   Telephone call with patient. His chest pain is improved. I considered increasing Nexium  to twice a day for GERD, but patient says he is okay with his chest pain at this time. Now that his blood pressure is better controlled on higher dose spironolactone , the patient has fewer/less severe chest pain episodes; I am not sure how this is related to chest pain since recent work-up for ACS in hospital was negative.  Also discussed GERD prevention diet, which patient seems to be following. He drinks plenty of water, avoids spicy/heavy foods, does not eat before bedtime.

## 2011-12-08 NOTE — Assessment & Plan Note (Signed)
>>  ASSESSMENT AND PLAN FOR OTHER GASTRITIS WITH BLEEDING WRITTEN ON 06/06/2024  2:44 PM BY ELICIA HAMLET, MD   >>ASSESSMENT AND PLAN FOR BARRETT'S ESOPHAGUS WRITTEN ON 12/08/2011 10:54 AM BY OH PARK, ANGELA J, MD  Documentation only. Dr. Rosalie recommending prn follow-up for stable Barrett's.   Telephone call with patient. His chest pain is improved. I considered increasing Nexium  to twice a day for GERD, but patient says he is okay with his chest pain at this time. Now that his blood pressure is better controlled on higher dose spironolactone , the patient has fewer/less severe chest pain episodes; I am not sure how this is related to chest pain since recent work-up for ACS in hospital was negative.  Also discussed GERD prevention diet, which patient seems to be following. He drinks plenty of water, avoids spicy/heavy foods, does not eat before bedtime.

## 2011-12-08 NOTE — Assessment & Plan Note (Signed)
Documentation only. Dr. Ewing Schlein recommending prn follow-up for stable Barrett's.   Telephone call with patient. His chest pain is improved. I considered increasing Nexium to twice a day for GERD, but patient says he is okay with his chest pain at this time. Now that his blood pressure is better controlled on higher dose spironolactone, the patient has fewer/less severe chest pain episodes; I am not sure how this is related to chest pain since recent work-up for ACS in hospital was negative.  Also discussed GERD prevention diet, which patient seems to be following. He drinks plenty of water, avoids spicy/heavy foods, does not eat before bedtime.

## 2011-12-16 ENCOUNTER — Other Ambulatory Visit (HOSPITAL_COMMUNITY): Payer: Self-pay | Admitting: Nephrology

## 2011-12-16 ENCOUNTER — Other Ambulatory Visit (HOSPITAL_COMMUNITY): Payer: Self-pay | Admitting: Internal Medicine

## 2011-12-16 DIAGNOSIS — I10 Essential (primary) hypertension: Secondary | ICD-10-CM

## 2011-12-16 DIAGNOSIS — D3500 Benign neoplasm of unspecified adrenal gland: Secondary | ICD-10-CM

## 2011-12-19 ENCOUNTER — Telehealth (HOSPITAL_COMMUNITY): Payer: Self-pay

## 2011-12-19 ENCOUNTER — Other Ambulatory Visit: Payer: Self-pay | Admitting: Physician Assistant

## 2011-12-19 NOTE — Telephone Encounter (Signed)
Called and left a message for the pt stating that his procedure has been moved to Weds. Instead of Tuesday due to needing to be scheduled on Dr. Kizzie Bane day.

## 2011-12-20 ENCOUNTER — Other Ambulatory Visit: Payer: Self-pay | Admitting: Family Medicine

## 2011-12-23 ENCOUNTER — Other Ambulatory Visit: Payer: Self-pay | Admitting: Family Medicine

## 2011-12-23 ENCOUNTER — Ambulatory Visit (HOSPITAL_COMMUNITY): Admission: RE | Admit: 2011-12-23 | Payer: Medicaid Other | Source: Ambulatory Visit

## 2011-12-23 ENCOUNTER — Other Ambulatory Visit: Payer: Self-pay | Admitting: Physician Assistant

## 2011-12-24 ENCOUNTER — Ambulatory Visit (HOSPITAL_COMMUNITY)
Admission: RE | Admit: 2011-12-24 | Discharge: 2011-12-24 | Disposition: A | Discharge status: Home / Self Care | Payer: Medicaid Other | Source: Ambulatory Visit | Attending: Nephrology | Admitting: Nephrology

## 2011-12-24 ENCOUNTER — Encounter (HOSPITAL_COMMUNITY): Payer: Self-pay

## 2011-12-24 ENCOUNTER — Other Ambulatory Visit (HOSPITAL_COMMUNITY): Payer: Self-pay | Admitting: Nephrology

## 2011-12-24 ENCOUNTER — Other Ambulatory Visit: Payer: Self-pay | Admitting: Family Medicine

## 2011-12-24 VITALS — BP 157/104 | HR 86 | Temp 98.0°F | Resp 18 | Ht 67.0 in | Wt 165.0 lb

## 2011-12-24 DIAGNOSIS — D3500 Benign neoplasm of unspecified adrenal gland: Secondary | ICD-10-CM

## 2011-12-24 DIAGNOSIS — I10 Essential (primary) hypertension: Secondary | ICD-10-CM

## 2011-12-24 DIAGNOSIS — E269 Hyperaldosteronism, unspecified: Secondary | ICD-10-CM | POA: Insufficient documentation

## 2011-12-24 LAB — CORTISOL
Cortisol, Plasma: 13.6 ug/dL
Cortisol, Plasma: 10.6 ug/dL
Cortisol, Plasma: 12.7 ug/dL
Cortisol, Plasma: 8.1 ug/dL
Cortisol, Plasma: 25.2 ug/dL
Cortisol, Plasma: 4.8 ug/dL
Cortisol, Plasma: 23.9 ug/dL

## 2011-12-24 LAB — BASIC METABOLIC PANEL
CO2: 27 mEq/L (ref 19–32)
Calcium: 9.4 mg/dL (ref 8.4–10.5)
GFR calc non Af Amer: >90 mL/min (ref >90)
Potassium: 3.5 mEq/L (ref 3.5–5.1)
Sodium: 140 mEq/L (ref 135–145)

## 2011-12-24 LAB — PROTIME-INR: Prothrombin Time: 13.3 seconds (ref 11.6–15.2)

## 2011-12-24 LAB — CBC
Platelets: 217 10*3/uL (ref 150–400)
RBC: 4.45 MIL/uL (ref 4.22–5.81)
WBC: 9.1 10*3/uL (ref 4.0–10.5)

## 2011-12-24 MED ORDER — DEXTROSE 5 % IV SOLN
Freq: Once | INTRAVENOUS | Status: DC
  Filled 2011-12-24: qty 0.25

## 2011-12-24 MED ORDER — IOHEXOL 300 MG/ML  SOLN
150.0000 mL | Freq: Once | INTRAMUSCULAR | Status: AC | PRN
  Administered 2011-12-24: 60 mL via INTRAVENOUS

## 2011-12-24 MED ORDER — DEXTROSE 5 % IV SOLN
INTRAVENOUS | Status: DC
  Filled 2011-12-24: qty 1000

## 2011-12-24 MED ORDER — SODIUM CHLORIDE 0.45 % IV SOLN
INTRAVENOUS | Status: DC
  Administered 2011-12-24: 07:00:00 via INTRAVENOUS

## 2011-12-24 MED ORDER — FENTANYL CITRATE 0.05 MG/ML IJ SOLN
INTRAMUSCULAR | Status: AC
  Filled 2011-12-24: qty 4

## 2011-12-24 MED ORDER — FENTANYL CITRATE 0.05 MG/ML IJ SOLN
INTRAMUSCULAR | Status: AC | PRN
  Administered 2011-12-24: 25 ug via INTRAVENOUS
  Administered 2011-12-24 (×6): 50 ug via INTRAVENOUS

## 2011-12-24 MED ORDER — MIDAZOLAM HCL 2 MG/2ML IJ SOLN
INTRAMUSCULAR | Status: AC
  Filled 2011-12-24: qty 6

## 2011-12-24 MED ORDER — MIDAZOLAM HCL 5 MG/5ML IJ SOLN
INTRAMUSCULAR | Status: AC | PRN
  Administered 2011-12-24 (×3): 2 mg via INTRAVENOUS

## 2011-12-24 MED ORDER — HYDROCODONE-ACETAMINOPHEN 5-500 MG PO TABS
1.0000 | ORAL_TABLET | Freq: Three times a day (TID) | ORAL | Status: DC | PRN
Start: 2011-12-24 — End: 2012-01-05

## 2011-12-24 MED ORDER — HYDROCODONE-ACETAMINOPHEN 5-325 MG PO TABS: 1.0000 | ORAL_TABLET | ORAL | Status: DC | PRN

## 2011-12-24 MED ORDER — MIDAZOLAM HCL 2 MG/2ML IJ SOLN
INTRAMUSCULAR | Status: AC
  Filled 2011-12-24: qty 4

## 2011-12-24 MED ORDER — DEXTROSE 5 % IV SOLN
250.0000 ug | Freq: Once | INTRAVENOUS | Status: AC
  Administered 2011-12-24: 0.25 mg via INTRAVENOUS
  Filled 2011-12-24: qty 0.25

## 2011-12-24 NOTE — Discharge Instructions (Signed)

## 2011-12-24 NOTE — H&P (Signed)
Agree 

## 2011-12-24 NOTE — ED Notes (Signed)
Pedal pulses +2

## 2011-12-24 NOTE — H&P (Signed)
Andrew Huerta is an 46 y.o. male.   Chief Complaint: long history of hypertension; low potassium- concern for hyperaldosteronism. Scheduled now for adrenal vein sampling HPI: Barret's esophagus; renal stones; H/As  Past Medical History  Diagnosis Date  . Hypertension   . Hypokalemia   . Barrett's esophagus     egd - 11/27/09 - short segment of Barrett's  . Hyperlipidemia   . Degenerative joint disease     Bilateral knees. Significant knee pain since playing football in high school.   . Anxiety   . Low back pain   . Hypokalemia   . Sleep apnea   . Chronic kidney disease     stone  . Cancer     barrets diease of esophagus gets cancer test every 2 yrs  . GERD (gastroesophageal reflux disease)   . Headache     when b/p get high  . Hiatal hernia     egd - 11/27/2009  . Depression   . Chest pain     NL Echo 08/2006    No past surgical history on file.  No family history on file. Social History:  reports that he has never smoked. He has never used smokeless tobacco. He reports that he does not drink alcohol or use illicit drugs.  Allergies:  Allergies  Allergen Reactions  . Cortisone Acetate Swelling    REACTION: unspecified  . Darvocet (Propoxacet-N) Nausea And Vomiting  . Nsaids     Caused stomach ulcers in the past      (Not in a hospital admission)  Results for orders placed during the hospital encounter of 12/24/11 (from the past 48 hour(s))  CBC     Status: Abnormal   Collection Time   12/24/11  7:00 AM      Component Value Range Comment   WBC 9.1  4.0 - 10.5 (K/uL)    RBC 4.45  4.22 - 5.81 (MIL/uL)    Hemoglobin 13.4  13.0 - 17.0 (g/dL)    HCT 16.1 (*) 09.6 - 52.0 (%)    MCV 86.7  78.0 - 100.0 (fL)    MCH 30.1  26.0 - 34.0 (pg)    MCHC 34.7  30.0 - 36.0 (g/dL)    RDW 04.5  40.9 - 81.1 (%)    Platelets 217  150 - 400 (K/uL)   PROTIME-INR     Status: Normal   Collection Time   12/24/11  7:00 AM      Component Value Range Comment   Prothrombin Time 13.3   11.6 - 15.2 (seconds)    INR 0.99  0.00 - 1.49     No results found.  Review of Systems  Constitutional: Negative for fever.  Eyes: Negative for blurred vision.  Respiratory: Negative for cough.   Gastrointestinal: Negative for nausea and vomiting.  Neurological: Positive for headaches.    Blood pressure 144/88, pulse 80, temperature 97.6 F (36.4 C), temperature source Oral, resp. rate 18, height 5\' 7"  (1.702 m), weight 165 lb (74.844 kg), SpO2 98.00%. Physical Exam  Constitutional: He is oriented to person, place, and time. He appears well-developed and well-nourished.  Cardiovascular: Normal rate, regular rhythm and normal heart sounds.   No murmur heard. Respiratory: Effort normal and breath sounds normal. He has no wheezes.  GI: Soft. Bowel sounds are normal. There is no tenderness.  Musculoskeletal: Normal range of motion.  Neurological: He is alert and oriented to person, place, and time.  Skin: Skin is warm.  Psychiatric: He has  a normal mood and affect. His behavior is normal. Judgment and thought content normal.     Assessment/Plan Continued hypertension; hypokalemia Concern for hyperaldosteronism Scheduled now for adrenal vein sampling Pt aware of procedure benefits and risks and agreeable to proceed. Consent signed  Gerardo Territo A 12/24/2011, 7:47 AM

## 2011-12-24 NOTE — ED Notes (Signed)
Cortrosyn stopped.

## 2011-12-24 NOTE — Procedures (Signed)
Procedure:  Bilateral renal venography, adrenal venography and bilateral adrenal vein sampling Access:  Bilateral common femoral veins, 5 Fr sheaths Findings:  Pre and post stimulation adrenal vein sampling performed.  Please see final report after sampling results finalized.

## 2011-12-24 NOTE — ED Notes (Signed)
O2 d/c'd 

## 2011-12-24 NOTE — ED Notes (Signed)
+   Pedal Pulses

## 2011-12-24 NOTE — ED Notes (Signed)
Both sheaths out, pressure held by [employer], RT.

## 2011-12-28 LAB — ALDOSTERONE
Aldosterone, Serum: 18 ng/dL
Aldosterone, Serum: 1229 ng/dL
Aldosterone, Serum: 25 ng/dL
Aldosterone, Serum: 4788 ng/dL
Aldosterone, Serum: 6 ng/dL

## 2012-01-01 ENCOUNTER — Telehealth: Payer: Self-pay | Admitting: Family Medicine

## 2012-01-01 NOTE — Telephone Encounter (Signed)
Has elevated BP and doesn't feel good - not sure if it's from his adrenal gland

## 2012-01-01 NOTE — Telephone Encounter (Signed)
Patient states he just checked BP and is 182/110. He had a procedure last week and ever since then his chest has been hurting, up into neck.  No nausea, shortness of breath . Advised patient that he can come in this AM for evaluation but he only wants to see Dr. Madolyn Frieze. Advised that she is not available and not in clinic today.  He states he will call his kidney doctor instead. Encouraged him to come here also but he states he will call back. Dr. Leveda Anna notified.

## 2012-01-05 ENCOUNTER — Ambulatory Visit (INDEPENDENT_AMBULATORY_CARE_PROVIDER_SITE_OTHER): Payer: Medicaid Other | Admitting: Family Medicine

## 2012-01-05 ENCOUNTER — Encounter: Payer: Self-pay | Admitting: Family Medicine

## 2012-01-05 DIAGNOSIS — R079 Chest pain, unspecified: Secondary | ICD-10-CM

## 2012-01-05 LAB — BASIC METABOLIC PANEL
BUN: 20 mg/dL (ref 6–23)
Chloride: 103 mEq/L (ref 96–112)
Potassium: 4.2 mEq/L (ref 3.5–5.3)
Sodium: 138 mEq/L (ref 135–145)

## 2012-01-05 MED ORDER — DICLOFENAC SODIUM 1 % TD GEL: 1.0000 "application " | Freq: Four times a day (QID) | TRANSDERMAL | Status: DC | PRN

## 2012-01-05 MED ORDER — HYDROCODONE-ACETAMINOPHEN 5-500 MG PO TABS: 1.0000 | ORAL_TABLET | Freq: Three times a day (TID) | ORAL | Status: DC | PRN

## 2012-01-05 MED ORDER — LORAZEPAM 2 MG PO TABS: 2.0000 mg | ORAL_TABLET | Freq: Four times a day (QID) | ORAL | Status: AC | PRN

## 2012-01-05 NOTE — Patient Instructions (Signed)
Use the pain gel/patches up to four times a day. Try the muscle relaxant at nighttime (Ativan).  If you develop a rash, let me know.   Your CT chest is on Wednesday at 1pm.   Follow-up in 1 week.

## 2012-01-05 NOTE — Progress Notes (Signed)
  Subjective:    Patient ID: Andrew Huerta, male    DOB: Feb 18, 1966, 46 y.o.   MRN: [id number]  HPI Acute visit for acute on chronic chest pain.  Chronically has left-sided chest pain that radiates down left arm. For the past few days, worsening of pain. Comes and goes, aching pain.  Alleviated by: nothing Exacerbated by: coughing, sneezing, deep breaths, leaning forward ROS: denies worsening of chest pain with activity; denies difficulty breathing; denies nausea/vomiting; decent viral symptoms (fevers, cough, malaise)  2. Hypertension, adrenal adenoma Started on new blood pressure medications by nephrologist. (Added to Med Rec).  Blood pressures recently ranging from 120-180s/89-120s. Most recent blood pressures have been better than previous ones (brings record starting from end of March).   Review of Systems Per HPI Occasional lower leg cramping. Potassium stopped by nephrologist past 1-2 months due to increased dose of spironolactone.     Objective:   Physical Exam Gen: appears uncomfortable Psych: conversant, appropriate CV: RRR, no m/r/g Pulm: CTAB without w/r/r; NI WOB Chest: significant severe TTP left upper chest; skin is ?mildly erythematous on left without other lesions Ext: no edema    Assessment & Plan:

## 2012-01-05 NOTE — Assessment & Plan Note (Signed)
Acute on chronic exacerbation past few days.  Appears musculoskeletal; pain is easily reproducible and significant to palpation. Try Ativan and voltaren gel.  Will check potassium. Past 1-2 months, K supplementation discontinued due to increasing spironolactone dose by nephrologist.  Mildly erythematous. History of chicken pox as child. Given signs of shingles. Never received vaccine.   Will also check CT chest to rule-out pulmonary or other abnormality in chest due to significant persistent chronic chest pain past few-several years.   Patient has been using more Vicodin due to chest pain. New Rx for 1 month's worth (45 tablets) given. Usual amount is 30 tablets.

## 2012-01-06 ENCOUNTER — Ambulatory Visit: Payer: Medicaid Other | Admitting: Family Medicine

## 2012-01-06 ENCOUNTER — Telehealth: Payer: Self-pay | Admitting: Family Medicine

## 2012-01-06 NOTE — Telephone Encounter (Signed)
Rx called into pharmacy pt notified and told that the results of K have not returned and Dr. Madolyn Frieze will call him with the results.Loralee Pacas [place]

## 2012-01-06 NOTE — Telephone Encounter (Signed)
Will you please call in Ativan for patient 2 mg po bid prn #10 with no refills, inform patient that it was called in, and notify him that K results have not returned? Thank you in advance.

## 2012-01-06 NOTE — Telephone Encounter (Signed)
Patient is calling because he thought that an Rx for muscle relaxers was going to be sent to [facility] on [address].  All of the other Rx's were there, but not the muscle relaxer.

## 2012-01-06 NOTE — Telephone Encounter (Signed)
After speaking to Dr. Madolyn Frieze, I called the patient back to let him know that Dr. Madolyn Frieze said that she handed him the Rx for the muscle relaxer, but after checking his paperwork, he did not find it.  Also, he had bloodwork to check his Pottassium level and he is wondering how long it takes to get the results.

## 2012-01-07 ENCOUNTER — Encounter: Payer: Self-pay | Admitting: Family Medicine

## 2012-01-07 ENCOUNTER — Inpatient Hospital Stay (HOSPITAL_COMMUNITY): Admission: RE | Admit: 2012-01-07 | Payer: Medicaid Other | Source: Ambulatory Visit

## 2012-01-07 ENCOUNTER — Other Ambulatory Visit (HOSPITAL_COMMUNITY): Payer: Medicaid Other

## 2012-01-07 NOTE — Progress Notes (Signed)
Addended by: Damita Lack on: 01/07/2012 08:42 AM   Modules accepted: Orders

## 2012-01-07 NOTE — Telephone Encounter (Signed)
Called and notified of normal lab results.

## 2012-01-09 ENCOUNTER — Ambulatory Visit (HOSPITAL_COMMUNITY)
Admission: RE | Admit: 2012-01-09 | Discharge: 2012-01-09 | Disposition: A | Discharge status: Home / Self Care | Payer: Medicaid Other | Source: Ambulatory Visit | Attending: Family Medicine | Admitting: Family Medicine

## 2012-01-09 DIAGNOSIS — I2510 Atherosclerotic heart disease of native coronary artery without angina pectoris: Secondary | ICD-10-CM | POA: Insufficient documentation

## 2012-01-09 DIAGNOSIS — R079 Chest pain, unspecified: Secondary | ICD-10-CM | POA: Insufficient documentation

## 2012-01-09 MED ORDER — IOHEXOL 300 MG/ML  SOLN
80.0000 mL | Freq: Once | INTRAMUSCULAR | Status: AC | PRN
  Administered 2012-01-09: 80 mL via INTRAVENOUS

## 2012-01-13 ENCOUNTER — Encounter: Payer: Self-pay | Admitting: Family Medicine

## 2012-01-13 ENCOUNTER — Ambulatory Visit (INDEPENDENT_AMBULATORY_CARE_PROVIDER_SITE_OTHER): Payer: Medicaid Other | Admitting: Family Medicine

## 2012-01-13 VITALS — BP 152/90 | HR 74 | Temp 98.1°F | Ht 67.0 in | Wt 172.0 lb

## 2012-01-13 DIAGNOSIS — M25529 Pain in unspecified elbow: Secondary | ICD-10-CM | POA: Insufficient documentation

## 2012-01-13 DIAGNOSIS — I10 Essential (primary) hypertension: Secondary | ICD-10-CM

## 2012-01-13 DIAGNOSIS — R079 Chest pain, unspecified: Secondary | ICD-10-CM

## 2012-01-13 LAB — SEDIMENTATION RATE: Sed Rate: 10 mm/hr (ref 0–16)

## 2012-01-13 LAB — URIC ACID: Uric Acid, Serum: 5.1 mg/dL (ref 4.0–7.8)

## 2012-01-13 NOTE — Progress Notes (Signed)
  Subjective:    Patient ID: Andrew Huerta, male    DOB: 1965/12/03, 46 y.o.   MRN: [id number]  HPI 1. Chest pain Improved but still present.  Voltaren gel helping a little bit. No nausea.   2. Elbow pain, bilateral Started a few days ago. No swelling, redness, fevers. No history of gout.  Denies doing any vigorous activity recently.   3. Hyperaldosteronism: hypertension, hypokalemia Now on spironolactone. K 5.0 yesterday at nephrologist's office. No medication changes made.  Review of Systems Per HPI. No difficulty breathing.     Objective:   Physical Exam Gen: NAD Psych: appropriate, engaged CV: RRR, no m/r/g Pulm: NI WOB, CTAB without w/r/r Chest: moderate TTP left upper chest  MSK:   Elbow, right and left     Inspection: normal without swelling, erythema     Palpation: tenderness to palpation distal to lateral epicondyle     Sensation: intact     Strength: intact flexion and extension     ROM: intact    Assessment & Plan:

## 2012-01-13 NOTE — Assessment & Plan Note (Signed)
Fair control today, not as good as prior. However, patient anxious about potassium. Will monitor for now and continue current treatment. BP medications being adjusted by Dr. Kathrene Bongo as well.

## 2012-01-13 NOTE — Assessment & Plan Note (Addendum)
Improving. Likely MSK. Continue Voltaren. CT normal except for calcifications in LAD. Consider starting statin. Patient would like to try more conservative therapy for now (fish oil, 10 pound weight loss). Consider re-checking in a few months and starting statin if worse at that time.  Framingham risk: low; 7-10% (higher based on more elevated BP) compared to comparative 11% for age/sex.   If chest pain persists, consider sending for repeat stress test. Cardiac work-up, including stress test, in December 2012 negative.

## 2012-01-13 NOTE — Patient Instructions (Signed)
For your chest pain, continue using the Voltaren gel and try cool/warm compresses.   For elbow pain, you may use above as well. We will also check blood tests today. If your lab results are normal, I will send you a letter with the results. If abnormal, someone at the clinic will get in touch with you.   Ask Dr. Kathrene Bongo if it would be appropriate to get potassium re-checked next week.   Your CT-chest shows calcifications in your left anterior descending coronary artery. If you continue to have chest pain, we may repeat a stress test in about 3 months.   For your cholesterol: try fish oil, 5-10 pound weight loss, and regular exercise.  Let's re-check your cholesterol in about 3 months. We may consider starting a statin at that time.   Follow-up in 3-4 weeks.

## 2012-01-14 ENCOUNTER — Encounter: Payer: Self-pay | Admitting: Family Medicine

## 2012-01-14 LAB — RHEUMATOID FACTOR: Rhuematoid fact SerPl-aCnc: <10 IU/mL (ref <=14)

## 2012-01-14 LAB — CYCLIC CITRUL PEPTIDE ANTIBODY, IGG: Cyclic Citrullin Peptide Ab: <2 U/mL (ref 0.0–5.0)

## 2012-01-14 NOTE — Assessment & Plan Note (Signed)
Patient presenting with bilateral elbow pain, immediately distal to lateral malleolus without appreciable swelling or redness.  He has a history of significant osteoarthritis of his knees, however, due to his multiple joint complaints, there is concern about other rhuematologic process. Will check ESR/CRP, RF, anti-CCP>>>WNL.  Will monitor symptoms for now. May use Voltaren gel/cool compresses over elbows to help with symptoms.

## 2012-01-22 ENCOUNTER — Encounter: Payer: Self-pay | Admitting: Family Medicine

## 2012-01-22 DIAGNOSIS — E2609 Other primary hyperaldosteronism: Secondary | ICD-10-CM | POA: Insufficient documentation

## 2012-01-22 HISTORY — DX: Other primary hyperaldosteronism: E26.09

## 2012-01-22 NOTE — Assessment & Plan Note (Signed)
Doc only. 05/21. Dr. Kathrene Bongo referred him to endocrine surgeon Dr. Duanne Guess and [facility].

## 2012-02-05 DIAGNOSIS — E7800 Pure hypercholesterolemia, unspecified: Secondary | ICD-10-CM | POA: Insufficient documentation

## 2012-02-05 DIAGNOSIS — I10 Essential (primary) hypertension: Secondary | ICD-10-CM | POA: Insufficient documentation

## 2012-02-05 DIAGNOSIS — IMO0002 Reserved for concepts with insufficient information to code with codable children: Secondary | ICD-10-CM | POA: Insufficient documentation

## 2012-02-05 DIAGNOSIS — G4730 Sleep apnea, unspecified: Secondary | ICD-10-CM | POA: Insufficient documentation

## 2012-02-05 DIAGNOSIS — K219 Gastro-esophageal reflux disease without esophagitis: Secondary | ICD-10-CM | POA: Insufficient documentation

## 2012-02-05 DIAGNOSIS — F329 Major depressive disorder, single episode, unspecified: Secondary | ICD-10-CM | POA: Insufficient documentation

## 2012-02-05 DIAGNOSIS — F32A Depression, unspecified: Secondary | ICD-10-CM | POA: Insufficient documentation

## 2012-02-09 ENCOUNTER — Other Ambulatory Visit: Payer: Self-pay | Admitting: Family Medicine

## 2012-02-09 ENCOUNTER — Ambulatory Visit (HOSPITAL_COMMUNITY)
Admission: RE | Admit: 2012-02-09 | Discharge: 2012-02-09 | Disposition: A | Discharge status: Home / Self Care | Payer: Medicaid Other | Source: Ambulatory Visit | Attending: Family Medicine | Admitting: Family Medicine

## 2012-02-09 ENCOUNTER — Ambulatory Visit (INDEPENDENT_AMBULATORY_CARE_PROVIDER_SITE_OTHER): Payer: Medicaid Other | Admitting: Family Medicine

## 2012-02-09 ENCOUNTER — Telehealth: Payer: Self-pay | Admitting: Family Medicine

## 2012-02-09 ENCOUNTER — Encounter: Payer: Self-pay | Admitting: Family Medicine

## 2012-02-09 VITALS — BP 145/96 | HR 72 | Temp 97.8°F | Ht 67.0 in | Wt 169.0 lb

## 2012-02-09 DIAGNOSIS — M1710 Unilateral primary osteoarthritis, unspecified knee: Secondary | ICD-10-CM

## 2012-02-09 DIAGNOSIS — M25569 Pain in unspecified knee: Secondary | ICD-10-CM | POA: Insufficient documentation

## 2012-02-09 DIAGNOSIS — M898X9 Other specified disorders of bone, unspecified site: Secondary | ICD-10-CM | POA: Insufficient documentation

## 2012-02-09 MED ORDER — ESOMEPRAZOLE MAGNESIUM 40 MG PO CPDR: 40.0000 mg | DELAYED_RELEASE_CAPSULE | Freq: Every day | ORAL | Status: DC

## 2012-02-09 MED ORDER — DICLOFENAC SODIUM 1 % TD GEL: 1.0000 "application " | Freq: Four times a day (QID) | TRANSDERMAL | Status: DC | PRN

## 2012-02-09 MED ORDER — SPIRONOLACTONE 50 MG PO TABS: 50.0000 mg | ORAL_TABLET | Freq: Two times a day (BID) | ORAL | Status: DC

## 2012-02-09 MED ORDER — BENAZEPRIL HCL 20 MG PO TABS: 20.0000 mg | ORAL_TABLET | Freq: Every day | ORAL | Status: DC

## 2012-02-09 MED ORDER — HYDROCODONE-ACETAMINOPHEN 5-500 MG PO TABS: 1.0000 | ORAL_TABLET | Freq: Three times a day (TID) | ORAL | Status: DC | PRN

## 2012-02-09 MED ORDER — CARVEDILOL 12.5 MG PO TABS: 12.5000 mg | ORAL_TABLET | Freq: Two times a day (BID) | ORAL | Status: DC

## 2012-02-09 NOTE — Telephone Encounter (Signed)
Called and discussed XR results.

## 2012-02-09 NOTE — Assessment & Plan Note (Addendum)
Worse today due to rainy weather. Right significantly worse than left today.  Patient ran out of Vicodin. Will re-fill.  Will check bilateral knee x-rays to evaluate osteoarthritis. No previous images on our system. Previously seen by Dr. Pecolia Ades [facility]).  Discussed potential right knee replacement. He had discussed this with Dr. Pecolia Ades previously. Patient did not want at that time due to financial and social constraints (he is a single dad). He may be interested now, if he is able to go on short-term disability. 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 He is undergoing testing for adrenal adenoma currently. Consider referring to Dr. Pecolia Ades once this issue is more stable. Follow-up in 1 week for Vicodin re-fill.

## 2012-02-09 NOTE — Patient Instructions (Signed)
X-rays of your knees.  Try diclofenac gel as needed with Vicodin as needed.   Follow-up in 1 month.

## 2012-02-09 NOTE — Progress Notes (Signed)
  Subjective:    Patient ID: Andrew Huerta, male    DOB: 1966/06/02, 46 y.o.   MRN: [id number]  HPI 46 year old with history of severe bilateral knee osteoarthritis presenting with knee pain with R>L.  Pain is worse today because of rainy weather.  Right side is usually worse than left. Vicodin helps. Ice/heat does not help and sometimes worsens pain.   Review of Systems Denies numbness, tingling.  Complaining of lower back pain (chronic).   Past Medical History, Family History, Social History, Allergies, and Medications reviewed.  Significant for DJD both knees and primary hyperaldosteronism (high suspect adrenal adenoma).     Objective:   Physical Exam GEN: WDWN, NAD, Non-toxic, Alert & Oriented NEURO: Normal gait.  PSYCH: Normally interactive. Conversant. Mildly anxious appearing (baseline).  Knee:  RIGHT and LEFT Gait: Normal heel toe pattern ROM: decreased extension (about 160 deg) right; 180 deg on left Effusion: chronic right effusion; none on left Echymosis or edema: none Patellar tendon NT Medial and lateral joint lines: mild-moderate positive on right; negative on left Mcmurray's: positive on right; neg left Varus and valgus stress: stable Lachman: neg Hip abduction, IR, ER: WNL Hip flexion str: 5/5 Hip abd: 5/5 Quad: 5/5    Assessment & Plan:

## 2012-03-08 ENCOUNTER — Ambulatory Visit (INDEPENDENT_AMBULATORY_CARE_PROVIDER_SITE_OTHER): Payer: Medicaid Other | Admitting: Family Medicine

## 2012-03-08 ENCOUNTER — Encounter: Payer: Self-pay | Admitting: Family Medicine

## 2012-03-08 VITALS — BP 137/85 | HR 99 | Temp 98.4°F | Ht 67.0 in | Wt 161.7 lb

## 2012-03-08 DIAGNOSIS — W57XXXA Bitten or stung by nonvenomous insect and other nonvenomous arthropods, initial encounter: Secondary | ICD-10-CM | POA: Insufficient documentation

## 2012-03-08 DIAGNOSIS — R112 Nausea with vomiting, unspecified: Secondary | ICD-10-CM

## 2012-03-08 DIAGNOSIS — T148XXA Other injury of unspecified body region, initial encounter: Secondary | ICD-10-CM

## 2012-03-08 DIAGNOSIS — R1110 Vomiting, unspecified: Secondary | ICD-10-CM | POA: Insufficient documentation

## 2012-03-08 DIAGNOSIS — T148 Other injury of unspecified body region: Secondary | ICD-10-CM

## 2012-03-08 LAB — BASIC METABOLIC PANEL
Calcium: 9.2 mg/dL (ref 8.4–10.5)
Creat: 0.9 mg/dL (ref 0.50–1.35)
Sodium: 138 mEq/L (ref 135–145)

## 2012-03-08 MED ORDER — DOXYCYCLINE HYCLATE 100 MG PO TABS: 100.0000 mg | ORAL_TABLET | Freq: Two times a day (BID) | ORAL | Status: AC

## 2012-03-08 MED ORDER — PROMETHAZINE HCL 12.5 MG PO TABS: 12.5000 mg | ORAL_TABLET | Freq: Three times a day (TID) | ORAL | Status: DC | PRN

## 2012-03-08 MED ORDER — ONDANSETRON HCL 8 MG PO TABS: 8.0000 mg | ORAL_TABLET | Freq: Three times a day (TID) | ORAL | Status: AC | PRN

## 2012-03-08 NOTE — Patient Instructions (Signed)
I am sorry you are not feeling well.  I have sent prescriptions for two kinds of nausea medications- Zofran and phenergan- to your pharmacy.  Please be sure to drink plenty of fluids.    Because you have been bitten with ticks recently and are now feeling badly, I want you to take three weeks of doxycycline, an antibiotic that treats tick bourne illnesses.  I think it is unlikely that this is why you are feeling badly, but I would rather be on the safe side.    Please keep your appointment with Dr. Madolyn Frieze on 03/10/12.

## 2012-03-08 NOTE — Assessment & Plan Note (Signed)
Will Rx Zofran and phenergan, patient instructed to alternate these medications, and advised fluids.  Will also check BMET to ensure he is not dehydrated or hypokalemic.

## 2012-03-08 NOTE — Progress Notes (Signed)
  Subjective:    Patient ID: Andrew Huerta, male    DOB: 08/29/1966, 46 y.o.   MRN: [id number]  HPI  Andrew Huerta comes in due to vomiting since last Thursday (4 days ago).  He says he was bitten by about 5 ticks and he had to remove them - that was one week ago.  He has had some subjective "hot flashes and sweats" but no true fevers.  He also endorses a headache.  He has some phenergan at home, which helps some with the nausea, but also makes him fall asleep.  He says he has tolerated some clear liquids but cannot keep down crackers.   He does have a history significant for Barrett's esophagus and Hiatal hernia- and has chronic problems with stomach upset, nausea and vomiting.    He also has hyperaldosteronism, and his potassium drops low.  He is supposed to take spironolactone for this, but says he has not been able to keep his medication down the past few days.    Past Medical History  Diagnosis Date  . Hypertension   . Hypokalemia   . Barrett's esophagus     egd - 11/27/09 - short segment of Barrett's  . Hyperlipidemia   . Degenerative joint disease     Bilateral knees. Significant knee pain since playing football in high school.   . Anxiety   . Low back pain   . Hypokalemia   . Sleep apnea   . Chronic kidney disease     stone  . Cancer     barrets diease of esophagus gets cancer test every 2 yrs  . GERD (gastroesophageal reflux disease)   . Headache     when b/p get high  . Hiatal hernia     egd - 11/27/2009  . Depression   . Chest pain     NL Echo 08/2006   History  Substance Use Topics  . Smoking status: Never Smoker   . Smokeless tobacco: Never Used  . Alcohol Use: No     Review of Systems Pertinent items in HPI    Objective:   Physical Exam  BP 137/85  Pulse 99  Temp 98.4 F (36.9 C) (Oral)  Ht 5\' 7"  (1.702 m)  Wt 161 lb 11.2 oz (73.347 kg)  BMI 25.33 kg/m2 General appearance: alert, cooperative and no distress Throat: oral mucosa moist Neck: no  adenopathy, no JVD, supple, symmetrical, trachea midline and thyroid not enlarged, symmetric, no tenderness/mass/nodules Lungs: clear to auscultation bilaterally Heart: regular rate and rhythm, S1, S2 normal, no murmur, click, rub or gallop Abdomen: Normal BS, soft, mild TTP, no rebound or guarding.  Extremities: extremities normal, atraumatic, no cyanosis or edema Pulses: 2+ and symmetric      Assessment & Plan:

## 2012-03-08 NOTE — Assessment & Plan Note (Signed)
Feel this is unlikely to be tick-bourne illness, more likely acute on chronic nausea/vomiting from his GI comorbidities.  However, as discussed with patient, feel treating with Doxycycline in case of contact with tick-bourne illness is safest thing to do, patient agrees.  Rx Doxycycline x 3 weeks.  He has an appointment with PCP in 2 days to follow up.

## 2012-03-10 ENCOUNTER — Ambulatory Visit: Payer: Medicaid Other | Admitting: Family Medicine

## 2012-03-10 ENCOUNTER — Ambulatory Visit (INDEPENDENT_AMBULATORY_CARE_PROVIDER_SITE_OTHER): Payer: Medicaid Other | Admitting: Family Medicine

## 2012-03-10 ENCOUNTER — Encounter: Payer: Self-pay | Admitting: Family Medicine

## 2012-03-10 VITALS — BP 96/61 | HR 77 | Temp 98.3°F | Ht 67.0 in | Wt 165.0 lb

## 2012-03-10 DIAGNOSIS — R112 Nausea with vomiting, unspecified: Secondary | ICD-10-CM

## 2012-03-10 DIAGNOSIS — E269 Hyperaldosteronism, unspecified: Secondary | ICD-10-CM

## 2012-03-10 DIAGNOSIS — M1710 Unilateral primary osteoarthritis, unspecified knee: Secondary | ICD-10-CM

## 2012-03-10 DIAGNOSIS — E2609 Other primary hyperaldosteronism: Secondary | ICD-10-CM

## 2012-03-10 MED ORDER — HYDROCODONE-ACETAMINOPHEN 5-500 MG PO TABS: 1.0000 | ORAL_TABLET | Freq: Three times a day (TID) | ORAL | Status: DC | PRN

## 2012-03-10 MED ORDER — CAPSAICIN 0.025 % EX CREA: TOPICAL_CREAM | Freq: Two times a day (BID) | CUTANEOUS | Status: DC

## 2012-03-10 NOTE — Patient Instructions (Signed)
For low blood pressure: -Hold benazepril for 2-3 days until you start drinking better.   Re-fill Vicodin. -Try capsaicin cream bilateral knees.  Follow-up on August 15.

## 2012-03-10 NOTE — Assessment & Plan Note (Signed)
Not well controlled.  I think it would be appropriate to increase number of tablets from 45 to 90 (3 tablets a day).  We will refer him to orthopedics once his hyperaldosteronism is better evaluated. He has appointment August 9th.  Try capsaicin cream as well.

## 2012-03-10 NOTE — Assessment & Plan Note (Signed)
Documentation only. He has appointment at [facility] August 9th regarding management.

## 2012-03-10 NOTE — Progress Notes (Signed)
  Subjective:    Patient ID: Andrew Huerta, male    DOB: [birth year], 46 y.o.   MRN: [id number]  HPI 1. Follow-up: bilateral knee arthritis, needs Vicodin re-fills He receives 45 tablets a month. He feels he needs more. 2-4 tablets a day would be more sufficient. Vicodin does help him function.  Progress toward pain control and promoting increased function:  Not sufficient Current Non Narcotic Therapies: Voltaren gel does not help  Risk Assessment for Opioid Therapy     Asking for early refills; frequent telephone calls or lost prescriptions no   Narcotics from other health providers: no  2. He was diagnosed presumptively for tick bourne illness. He feels better with the doxycycline but is still nauseous. Zofran helps.  He denies fevers/chills.  Review of Systems Per HPI.  Past Medical History, Family History, Social History, Allergies, and Medications reviewed. Primary hyperaldosteronism.     Objective:   Physical Exam GEN: NAD; well-appearing, thin PSYCH: engaged, appropriate to questions, normally conversant, anxious-appearing (baseline), not depressed-appearing CV: RRR PULM: NI WOB ABD: NABS, soft, NT, ND MSK:    Knees: bony; medial joint line tenderness bilaterally; positive McMurray's; no erythema, swelling, warmth    Assessment & Plan:

## 2012-03-10 NOTE — Assessment & Plan Note (Signed)
Improved but still needing to take Zofran daily.  Hold benazepril for 2-3 days. Concern for dehydration with low blood pressure.  May resume when PO intake improves.

## 2012-04-14 ENCOUNTER — Encounter: Payer: Self-pay | Admitting: Family Medicine

## 2012-04-14 ENCOUNTER — Ambulatory Visit (INDEPENDENT_AMBULATORY_CARE_PROVIDER_SITE_OTHER): Payer: Medicaid Other | Admitting: Family Medicine

## 2012-04-14 VITALS — BP 119/82 | HR 97 | Temp 98.0°F | Ht 67.0 in | Wt 171.0 lb

## 2012-04-14 DIAGNOSIS — R112 Nausea with vomiting, unspecified: Secondary | ICD-10-CM

## 2012-04-14 DIAGNOSIS — I10 Essential (primary) hypertension: Secondary | ICD-10-CM

## 2012-04-14 DIAGNOSIS — M546 Pain in thoracic spine: Secondary | ICD-10-CM

## 2012-04-14 DIAGNOSIS — E269 Hyperaldosteronism, unspecified: Secondary | ICD-10-CM

## 2012-04-14 DIAGNOSIS — R0789 Other chest pain: Secondary | ICD-10-CM

## 2012-04-14 DIAGNOSIS — IMO0002 Reserved for concepts with insufficient information to code with codable children: Secondary | ICD-10-CM

## 2012-04-14 DIAGNOSIS — M1710 Unilateral primary osteoarthritis, unspecified knee: Secondary | ICD-10-CM

## 2012-04-14 DIAGNOSIS — E2609 Other primary hyperaldosteronism: Secondary | ICD-10-CM

## 2012-04-14 HISTORY — DX: Pain in thoracic spine: M54.6

## 2012-04-14 MED ORDER — ONDANSETRON HCL 4 MG PO TABS: 4.0000 mg | ORAL_TABLET | Freq: Three times a day (TID) | ORAL | Status: DC | PRN

## 2012-04-14 MED ORDER — HYDROCODONE-ACETAMINOPHEN 5-500 MG PO TABS: 1.0000 | ORAL_TABLET | Freq: Three times a day (TID) | ORAL | Status: DC | PRN

## 2012-04-14 NOTE — Assessment & Plan Note (Addendum)
Controlled with Vicodin tid.  -Rx for 1 month -Denies referral to orthopedics. 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

## 2012-04-14 NOTE — Assessment & Plan Note (Signed)
Improved but still present. Patient thinks may be anxiety related.  -Recommend he make a follow-up to discuss anxiety and potential depression

## 2012-04-14 NOTE — Patient Instructions (Signed)
Follow-up within a month to discuss mood/anxiety  It was nice to see you today Andrew Huerta

## 2012-04-14 NOTE — Assessment & Plan Note (Signed)
Controlled with Vicodin  He is not interested in referral to pain management/orthopedist/back specialist at this time

## 2012-04-14 NOTE — Assessment & Plan Note (Signed)
Controlled.   -Continue current medication

## 2012-04-14 NOTE — Assessment & Plan Note (Signed)
-  Patient singed ROI so we can receive records from [facility]. Per patient, no surgery and no further follow-up with them needed.  -He has follow-up Dr. Kathrene Bongo around 08/20.

## 2012-04-14 NOTE — Progress Notes (Signed)
  Subjective:    Patient ID: Andrew Huerta, male    DOB: Sep 22, 1965, 46 y.o.   MRN: [id number]  HPI # Knee pain, back pain Chronic  Controlled with Vicodin three times a day ROS: denies weakness, paresthesias, worsening of pain   # Hypertension Compliant with medications  # Chest pain Improved with better blood pressure control  Review of Systems Denies chest pain Denies difficulty breathing Denies dyspnea Complaining of occasional nausea, alleviated by Zofran, may be related to anxiety  Allergies, medication, past medical history reviewed.     Objective:   Physical Exam GEN: NAD PSYCH: engaged, appropriate to questions CV: RRR PULM: NI WOB EXT: no edema KNEE: bony knees bilaterally, mild TTP medial and lateral joint lines; no swelling, erythema, warmth BACK: mild mid-line thoracic TTP     Assessment & Plan:

## 2012-04-14 NOTE — Assessment & Plan Note (Signed)
Improved since starting spironolactone. Nephrologist at [facility] told him may be associated with hyperaldosteronism.

## 2012-05-07 ENCOUNTER — Ambulatory Visit (INDEPENDENT_AMBULATORY_CARE_PROVIDER_SITE_OTHER): Payer: Medicaid Other | Admitting: Family Medicine

## 2012-05-07 VITALS — BP 158/98 | HR 74 | Temp 98.0°F | Ht 67.0 in | Wt 170.0 lb

## 2012-05-07 DIAGNOSIS — F4310 Post-traumatic stress disorder, unspecified: Secondary | ICD-10-CM

## 2012-05-07 DIAGNOSIS — M1710 Unilateral primary osteoarthritis, unspecified knee: Secondary | ICD-10-CM

## 2012-05-07 DIAGNOSIS — E2609 Other primary hyperaldosteronism: Secondary | ICD-10-CM

## 2012-05-07 DIAGNOSIS — I10 Essential (primary) hypertension: Secondary | ICD-10-CM

## 2012-05-07 DIAGNOSIS — E269 Hyperaldosteronism, unspecified: Secondary | ICD-10-CM

## 2012-05-07 DIAGNOSIS — R0789 Other chest pain: Secondary | ICD-10-CM

## 2012-05-07 MED ORDER — DULOXETINE HCL 30 MG PO CPEP: ORAL_CAPSULE | ORAL | Status: DC

## 2012-05-07 MED ORDER — HYDROCODONE-ACETAMINOPHEN 5-500 MG PO TABS: 1.0000 | ORAL_TABLET | Freq: Three times a day (TID) | ORAL | Status: DC | PRN

## 2012-05-07 NOTE — Assessment & Plan Note (Signed)
We will treat his anxiety to see if this helps the chest pain.
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
## 2012-05-07 NOTE — Assessment & Plan Note (Signed)
Rx for Vicodin. Follow-up in 1 month.

## 2012-05-07 NOTE — Patient Instructions (Addendum)
For your anxiety and pain: -Try Cymbalta (see information below about Cymbalta)   Week 1: Take 1 tablet daily   Week 2: Take 2 tablets daily  Follow-up in 2 weeks for your anxiety and your blood pressure   CYMBALTA What are some things I need to know or do while I take this drug?  . Tell dentists, surgeons, and other doctors that you use this drug.  . If you have been taking this drug for many weeks, talk with your doctor before stopping. You may want to slowly stop this drug.  . If you have bleeding problems, talk with your doctor.  . If you have high blood sugar (diabetes), talk with your doctor.  . If you have high blood pressure, talk with your doctor.  . If you have kidney disease, talk with your doctor.  . If you have liver disease, talk with your doctor.  . If you have seizures, talk with your doctor.  . Have your eye pressure checked. Talk with your doctor.  . Talk with your doctor before using products that have aspirin, blood thinners, garlic, ginseng, ginkgo, ibuprofen or like products, pain drugs, or vitamin E.  . Avoid beer, wine, mixed drinks, or other drugs and natural products that slow your actions.  . Tell your doctor if you are pregnant or plan on getting pregnant. You will need to talk about the benefits and risks of using this drug while you are pregnant.  . Tell your doctor if you are breast-feeding. You will need to talk about any risks to your baby.  . If you think there has been an overdose, call your poison control center or get medical care right away. Be ready to tell or show what was taken, how much, and when it happened. What are some side effects that I need to call my doctor about right away?  WARNING/CAUTION: Even though it may be rare, some people may have very bad and sometimes deadly side effects when taking a drug. Tell your doctor or get medical help right away if you have any of the following signs or symptoms that may be related to a very bad side  effect:  . Signs of an allergic reaction, like rash; hives; itching; red, swollen, blistered, or peeling skin with or without fever; wheezing; tightness in the chest or throat; trouble breathing or talking; unusual hoarseness; or swelling of the mouth, face, lips, tongue, or throat.  . If you are planning to harm yourself or the want to harm yourself gets worse.  . Change in thinking clearly and with logic.  . Big change in balance.  . Agitation, twitching, sweating, or muscle stiffness.  . Very upset stomach or throwing up.  . Feeling very tired or weak.  . A fast heartbeat.  . Any bruising or bleeding.  Dot Been urine or yellow skin or eyes.  . Not able to eat.  . Very bad skin irritation. What are some other side effects of this drug?  All drugs may cause side effects. However, many people have no side effects or only have minor side effects. Call your doctor or get medical help if any of these side effects or any other side effects bother you or do not go away:  Marland Kitchen Feeling lightheaded, sleepy, having blurred eyesight, or a change in thinking clearly. Avoid driving and doing other tasks or actions that call for you to be alert or have clear eyesight until you see how this drug affects  you.  . Nervous and excitable.  Marland Kitchen Upset stomach or throwing up. Many small meals, good mouth care, sucking hard, sugar-free candy, or chewing sugar-free gum may help.  . Hard stools (constipation). Drinking more liquids, working out, or adding fiber to your diet may help. Talk with your doctor about a stool softener or laxative.  . Loose stools (diarrhea).  . Headache.  . Dry mouth. Good mouth care, sucking hard, sugar-free candy, or chewing sugar-free gum may help. See a dentist often.  . Change in sex ability.  . Not able to sleep.  These are not all of the side effects that may occur. If you have questions about side effects, call your doctor. Call your doctor for medical advice about side effects.  You may  report side effects to your national health agency. How is this drug best taken?  Use this drug as ordered by your doctor. Read and follow the dosing on the label closely.  . To gain the most benefit, do not miss doses.  . Take with food to stop an upset stomach.  . Swallow whole. Do not chew, break, or crush.  . You may sprinkle contents of capsule on applesauce or in apple juice. Do not chew. What do I do if I miss a dose?  Marland Kitchen Take a missed dose as soon as you think about it.  . If it is close to the time for your next dose, skip the missed dose and go back to your normal time.  . Do not take 2 doses at the same time or extra doses.  . Do not change the dose or stop this drug. Talk with the doctor. How do I store and/or throw out this drug?  . Store at room temperature.  . Protect from light.  . Store in a dry place. Do not store in a bathroom.  Marland Kitchen Keep all drugs out of the reach of children and pets.  . Check with your pharmacist about how to throw out unused drugs.
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
## 2012-05-07 NOTE — Assessment & Plan Note (Signed)
Documentation only. He was told at [facility] that there was nothing for them to do. No further follow-up was recommended. They may consider repeat adrenal vein sampling if there is concern for worsening symptoms.

## 2012-05-14 ENCOUNTER — Ambulatory Visit: Payer: Medicaid Other | Admitting: Family Medicine

## 2012-05-16 ENCOUNTER — Encounter: Payer: Self-pay | Admitting: Family Medicine

## 2012-05-16 DIAGNOSIS — E2609 Other primary hyperaldosteronism: Secondary | ICD-10-CM

## 2012-05-24 ENCOUNTER — Encounter: Payer: Self-pay | Admitting: Family Medicine

## 2012-05-24 ENCOUNTER — Ambulatory Visit (INDEPENDENT_AMBULATORY_CARE_PROVIDER_SITE_OTHER): Payer: Medicaid Other | Admitting: Family Medicine

## 2012-05-24 VITALS — BP 174/105 | HR 76 | Temp 97.9°F | Ht 67.0 in | Wt 172.0 lb

## 2012-05-24 DIAGNOSIS — R0689 Other abnormalities of breathing: Secondary | ICD-10-CM

## 2012-05-24 DIAGNOSIS — I10 Essential (primary) hypertension: Secondary | ICD-10-CM

## 2012-05-24 DIAGNOSIS — E269 Hyperaldosteronism, unspecified: Secondary | ICD-10-CM

## 2012-05-24 DIAGNOSIS — E2609 Other primary hyperaldosteronism: Secondary | ICD-10-CM

## 2012-05-24 DIAGNOSIS — R6889 Other general symptoms and signs: Secondary | ICD-10-CM

## 2012-05-24 LAB — BASIC METABOLIC PANEL
CO2: 29 mEq/L (ref 19–32)
Calcium: 9.5 mg/dL (ref 8.4–10.5)
Chloride: 103 mEq/L (ref 96–112)
Creat: 0.88 mg/dL (ref 0.50–1.35)
Sodium: 141 mEq/L (ref 135–145)

## 2012-05-24 MED ORDER — SPIRONOLACTONE 50 MG PO TABS: ORAL_TABLET | ORAL | Status: DC

## 2012-05-24 NOTE — Assessment & Plan Note (Signed)
His BP remains poorly controlled. Repeat BP reading today 168/104. We will increase spironolactone from 50 bid to 100 in the AM and 50 in the PM. Consider increasing benazepril if BP remains elevated. It is unclear why his BP control has deteriorated. Re-check BMET to evaluate K, Cr and re-check renin/aldosterone.

## 2012-05-24 NOTE — Assessment & Plan Note (Signed)
Refer for sleep study. May be contributing to elevated blood pressure.

## 2012-05-24 NOTE — Progress Notes (Signed)
  Subjective:    Patient ID: Andrew Huerta, male    DOB: 1966-04-19, 46 y.o.   MRN: [id number]  HPI # Hypertension, history of hyperaldosteronism  He takes his morning medicines at 10 am and did not take this morning yet He checked his blood pressure a few days ago at [facility] or [facility], and it was 137/90. The lowest it has been recently was 109/75.  ROS: he has a headache behind his right ear; usually his headaches are at his neck or front side of his head; denies worsening chest pain (he gets chest papin 1-2 times a day; it is not as severe as it has been in the past and it no longer radiates down his left arm)  # Choking sensation while sleeping He was told he had sleep apnea in the past but he denies sleep study He does not snore but breathes loudly while he sleeps and does not feel rested in the morning He denies his girlfriend mentioning his having any apneic episodes in the past  # Anxiety He stopped taking Cymbalta after 3 doses because it made him vomit every time.   Review of Systems Per HPI  Allergies, medication, past medical history reviewed.      Objective:   Physical Exam GEN: NAD CV: RRR, normal S1/S2, no murmurs or gallops PULM: NI WOB; CTAB NEURO: no focal deficits EXT: no edema    Assessment & Plan:

## 2012-05-24 NOTE — Patient Instructions (Addendum)
Increase spironolactone to 2 tablets in the morning and 1 tablet in the evening  Follow-up in 2 weeks  We will refer you to for a sleep study. Let me know if you have not heard from them in 1 week.

## 2012-05-27 ENCOUNTER — Encounter: Payer: Self-pay | Admitting: Family Medicine

## 2012-05-30 LAB — ALDOSTERONE + RENIN ACTIVITY W/ RATIO
ALDO / PRA Ratio: 188.2 Ratio — ABNORMAL HIGH (ref 0.9–28.9)
Aldosterone: 32 ng/dL — ABNORMAL HIGH

## 2012-06-07 ENCOUNTER — Ambulatory Visit: Payer: Medicaid Other | Admitting: Family Medicine

## 2012-06-15 ENCOUNTER — Ambulatory Visit (INDEPENDENT_AMBULATORY_CARE_PROVIDER_SITE_OTHER): Payer: Medicaid Other | Admitting: Family Medicine

## 2012-06-15 ENCOUNTER — Encounter: Payer: Self-pay | Admitting: Family Medicine

## 2012-06-15 VITALS — BP 143/97 | HR 81 | Temp 98.1°F | Ht 67.0 in | Wt 173.0 lb

## 2012-06-15 DIAGNOSIS — R0789 Other chest pain: Secondary | ICD-10-CM

## 2012-06-15 DIAGNOSIS — I10 Essential (primary) hypertension: Secondary | ICD-10-CM

## 2012-06-15 DIAGNOSIS — K2270 Barrett's esophagus without dysplasia: Secondary | ICD-10-CM

## 2012-06-15 DIAGNOSIS — IMO0002 Reserved for concepts with insufficient information to code with codable children: Secondary | ICD-10-CM

## 2012-06-15 DIAGNOSIS — R6889 Other general symptoms and signs: Secondary | ICD-10-CM

## 2012-06-15 DIAGNOSIS — M1710 Unilateral primary osteoarthritis, unspecified knee: Secondary | ICD-10-CM

## 2012-06-15 DIAGNOSIS — R0689 Other abnormalities of breathing: Secondary | ICD-10-CM

## 2012-06-15 LAB — BASIC METABOLIC PANEL
BUN: 17 mg/dL (ref 6–23)
CO2: 27 mEq/L (ref 19–32)
Glucose, Bld: 122 mg/dL — ABNORMAL HIGH (ref 70–99)
Potassium: 5.1 mEq/L (ref 3.5–5.3)

## 2012-06-15 MED ORDER — BENAZEPRIL HCL 20 MG PO TABS: 40.0000 mg | ORAL_TABLET | Freq: Every day | ORAL | Status: DC

## 2012-06-15 MED ORDER — HYDROCODONE-ACETAMINOPHEN 5-500 MG PO TABS: 1.0000 | ORAL_TABLET | Freq: Three times a day (TID) | ORAL | Status: DC | PRN

## 2012-06-15 NOTE — Patient Instructions (Addendum)
We will call about your sleep study. Call us in 1 week if you have not heard form any one.   Increase benazepril to 40 mg a day (2 tablets until you finish your current bottle)  If your lab results are normal, I will send you a letter with the results. If abnormal, someone at the clinic will get in touch with you.   Follow-up in 1 month

## 2012-06-16 NOTE — Assessment & Plan Note (Signed)
Vicodin refilled 

## 2012-06-16 NOTE — Assessment & Plan Note (Signed)
Present but unchanged. Cause remains unclear.

## 2012-06-16 NOTE — Assessment & Plan Note (Signed)
>>  ASSESSMENT AND PLAN FOR BARRETT'S ESOPHAGUS WRITTEN ON 06/16/2012  9:11 AM BY OH PARK, ANGELA J, MD  Discussed adverse affects of long-term high dose PPI. He will go back to taking only 20 mg daily. He does not want to take another medication (i.e., sucralfate ) at this time for potential relief. Consider checking DEXA scan due to long-term use of PPI. Consider checking for H. pylori at next visit.

## 2012-06-16 NOTE — Assessment & Plan Note (Signed)
Documentation only. He still has not been called regarding sleep study. We will call the center regarding appointment. Blue team flagged.

## 2012-06-16 NOTE — Assessment & Plan Note (Signed)
Discussed adverse affects of long-term high dose PPI. He will go back to taking only 20 mg daily. He does not want to take another medication (i.e., sucralfate) at this time for potential relief. Consider checking DEXA scan due to long-term use of PPI. Consider checking for H. pylori at next visit.

## 2012-06-16 NOTE — Assessment & Plan Note (Signed)
>>  ASSESSMENT AND PLAN FOR OTHER GASTRITIS WITH BLEEDING WRITTEN ON 06/06/2024  2:44 PM BY ELICIA HAMLET, MD   >>ASSESSMENT AND PLAN FOR BARRETT'S ESOPHAGUS WRITTEN ON 06/16/2012  9:11 AM BY OH PARK, ANGELA J, MD  Discussed adverse affects of long-term high dose PPI. He will go back to taking only 20 mg daily. He does not want to take another medication (i.e., sucralfate ) at this time for potential relief. Consider checking DEXA scan due to long-term use of PPI. Consider checking for H. pylori at next visit.

## 2012-06-16 NOTE — Assessment & Plan Note (Signed)
Increase benazepril from 20 to 40. Continue spironolactone and Coreg. Since we increased spironolactone at last visit, will check K today>>>normal but borderline high at 5.1. Increasing benazepril may help lower K.

## 2012-06-16 NOTE — Progress Notes (Signed)
  Subjective:    Patient ID: Andrew Huerta, male    DOB: 10-May-1966, 46 y.o.   MRN: [id number]  HPI # HTN BP outside of clinic about the same as today He reports compliance with medications  # GERD, Barrett's esophagus He takes Nexium 40 mg twice a day because once a day was not enough  # Knee pain bilaterally Requesting Vicodin refill He reports significant relief with medication 2-4 times daily  # Chest pain Persistent but unchanged  Review of Systems Per HPI  Allergies, medication, past medical history reviewed.     Objective:   Physical Exam GEN: NAD; thin CV: RRR, normal S1/S2, no murmurs/gallops PULM: NI WOB; CTAB EXT: no edema ABD: soft, NT, ND CHEST: non-tender    Assessment & Plan:

## 2012-06-17 ENCOUNTER — Encounter: Payer: Self-pay | Admitting: Family Medicine

## 2012-07-16 ENCOUNTER — Ambulatory Visit (INDEPENDENT_AMBULATORY_CARE_PROVIDER_SITE_OTHER): Payer: Medicaid Other | Admitting: Family Medicine

## 2012-07-16 ENCOUNTER — Encounter: Payer: Self-pay | Admitting: Family Medicine

## 2012-07-16 VITALS — BP 122/82 | HR 80 | Temp 98.7°F | Ht 67.0 in | Wt 171.0 lb

## 2012-07-16 DIAGNOSIS — F4310 Post-traumatic stress disorder, unspecified: Secondary | ICD-10-CM

## 2012-07-16 DIAGNOSIS — R0689 Other abnormalities of breathing: Secondary | ICD-10-CM

## 2012-07-16 DIAGNOSIS — M1710 Unilateral primary osteoarthritis, unspecified knee: Secondary | ICD-10-CM

## 2012-07-16 DIAGNOSIS — R6889 Other general symptoms and signs: Secondary | ICD-10-CM

## 2012-07-16 DIAGNOSIS — I10 Essential (primary) hypertension: Secondary | ICD-10-CM

## 2012-07-16 MED ORDER — HYDROCODONE-ACETAMINOPHEN 5-500 MG PO TABS: 1.0000 | ORAL_TABLET | Freq: Three times a day (TID) | ORAL | Status: DC | PRN

## 2012-07-16 NOTE — Assessment & Plan Note (Signed)
Stop Cymbalta since he stopped taking because it made him feel sedated. No medications at this time.

## 2012-07-16 NOTE — Patient Instructions (Addendum)
It was nice to see you as always.  Happy Thanksgiving.  Follow-up in 1 month or sooner if needed.

## 2012-07-16 NOTE — Assessment & Plan Note (Signed)
Controlled on current medications 

## 2012-07-16 NOTE — Assessment & Plan Note (Signed)
Unchanged. Significant relief and improvement in function with Vicodin 3-4 tablets daily. I would be hesitate to increase his #100 tablets a month. We discussed longer-acting pain medication (MS Contin) but he prefers to self-titrate and remain on Vicodin.

## 2012-07-16 NOTE — Assessment & Plan Note (Signed)
He still has not been scheduled for sleep study. I am not sure where this if falling true. We will try again.

## 2012-07-16 NOTE — Progress Notes (Signed)
  Subjective:    Patient ID: Andrew Huerta, male    DOB: 1965/09/25, 46 y.o.   MRN: [id number]  HPI # Hypertension  He is compliant with his medications He had blood pressure checked [facility] yesterday and it was 170/100s. It may have been because he had been walking around ROS: chest pain is unchanged; denies dyspnea  # Bilateral knee pain, severe osteoarthritis He takes Vicodin 3-4 tablets a day. It helps him to function during the day; he is able to clean the house and do other chores whereas without this medication, it is hard for him to do.  ROS: denies constipation, sedation  # PTSD He tried Cymbalta for a week. It just made him sleepy, "zombie-like" and he stopped taking  Review of Systems Per HPI  Allergies, medication, past medical history reviewed.      Objective:   Physical Exam GEN: NAD; well-appearing, -nourished CV: RRR, normal S1/S2 KNEE: hypertrophied/bony knees; no effusion, erythema, warmth   GAIT: moderately antalgic EXT: no edema    Assessment & Plan:

## 2012-07-21 ENCOUNTER — Other Ambulatory Visit: Payer: Self-pay | Admitting: Family Medicine

## 2012-07-29 ENCOUNTER — Telehealth: Payer: Self-pay | Admitting: Family Medicine

## 2012-07-29 NOTE — Telephone Encounter (Signed)
Would you please request consult notes and study results from Dr. Charlean Merl office endocrinologist at [facility] regarding this patient? I referred patient to Dr. Kathrene Bongo who in turn referred them to Dr. Lady Gary, so maybe they will send records without ROI. If they request one, then he has appointment with me in a couple of weeks and we can have him sign one then.

## 2012-08-03 NOTE — Telephone Encounter (Signed)
Request sent to Med rec @ Dr. Katina Dung office [phone]. Andrew Huerta, Andrew Huerta

## 2012-08-16 ENCOUNTER — Ambulatory Visit (HOSPITAL_BASED_OUTPATIENT_CLINIC_OR_DEPARTMENT_OTHER): Payer: Medicaid Other | Attending: Family Medicine | Admitting: Radiology

## 2012-08-16 VITALS — Ht 67.0 in | Wt 170.0 lb

## 2012-08-16 DIAGNOSIS — G4733 Obstructive sleep apnea (adult) (pediatric): Secondary | ICD-10-CM

## 2012-08-16 DIAGNOSIS — I10 Essential (primary) hypertension: Secondary | ICD-10-CM | POA: Insufficient documentation

## 2012-08-16 DIAGNOSIS — Z79899 Other long term (current) drug therapy: Secondary | ICD-10-CM | POA: Insufficient documentation

## 2012-08-16 DIAGNOSIS — R0689 Other abnormalities of breathing: Secondary | ICD-10-CM

## 2012-08-16 DIAGNOSIS — R5381 Other malaise: Secondary | ICD-10-CM | POA: Insufficient documentation

## 2012-08-16 DIAGNOSIS — R0609 Other forms of dyspnea: Secondary | ICD-10-CM | POA: Insufficient documentation

## 2012-08-16 DIAGNOSIS — R0989 Other specified symptoms and signs involving the circulatory and respiratory systems: Secondary | ICD-10-CM | POA: Insufficient documentation

## 2012-08-16 DIAGNOSIS — G4730 Sleep apnea, unspecified: Secondary | ICD-10-CM | POA: Insufficient documentation

## 2012-08-18 ENCOUNTER — Encounter: Payer: Self-pay | Admitting: Family Medicine

## 2012-08-18 ENCOUNTER — Ambulatory Visit (INDEPENDENT_AMBULATORY_CARE_PROVIDER_SITE_OTHER): Payer: Medicaid Other | Admitting: Family Medicine

## 2012-08-18 VITALS — BP 171/113 | HR 75 | Temp 98.0°F | Ht 67.0 in | Wt 174.0 lb

## 2012-08-18 DIAGNOSIS — M1710 Unilateral primary osteoarthritis, unspecified knee: Secondary | ICD-10-CM

## 2012-08-18 DIAGNOSIS — R0689 Other abnormalities of breathing: Secondary | ICD-10-CM

## 2012-08-18 DIAGNOSIS — R6889 Other general symptoms and signs: Secondary | ICD-10-CM

## 2012-08-18 DIAGNOSIS — I10 Essential (primary) hypertension: Secondary | ICD-10-CM

## 2012-08-18 MED ORDER — HYDROCODONE-ACETAMINOPHEN 5-500 MG PO TABS: 1.0000 | ORAL_TABLET | Freq: Three times a day (TID) | ORAL | Status: DC | PRN

## 2012-08-18 NOTE — Assessment & Plan Note (Addendum)
Elevated today, unclear why since no change from previous visit when well controlled. We will not change or add medications today since he reports SBP <120 at pharmacy. He was advised to follow-up with Dr. Raymondo Band for ambulatory blood pressure monitoring. He was given red flags for going to ED.

## 2012-08-18 NOTE — Assessment & Plan Note (Signed)
Documentation. He did get sleep study done a few days ago. I should be getting results in 1-2 weeks.

## 2012-08-18 NOTE — Patient Instructions (Addendum)
Make an appointment with Dr. Raymondo Band for ambulatory blood pressure monitoring  Follow-up with Dr. Madolyn Frieze in 1 month   Merry Christmas : )

## 2012-08-18 NOTE — Progress Notes (Signed)
  Subjective:    Patient ID: Andrew Huerta, male    DOB: 07/23/1966, 46 y.o.   MRN: [id number]  HPI # Bilateral knee osteoarthritis He is here for monthly refill on Vicodin. He is doing okay. The medication helps take the edge away but 3-4 daily does not seem to be quite adequate.   # Hypertension Elevated today, although no changes compared to last visit a month ago.  He reports compliance with medications.  Review of Systems Denies worsening of chronic chest pain, weakness, vision changes  Allergies, medication, past medical history reviewed.      Objective:   Physical Exam GEN: NAD CV: RRR, normal S1/S2, no murmurs PULM: NI WOB; CTAB EXT: no edema KNEES: hypertrophy bony changes R>L   Bilateral medial joint line tenderness   Gait: antalgic   Non-warm, -erythematous; no obvious effusion  Blood pressure re-checked and similar to previous.     Assessment & Plan:

## 2012-08-18 NOTE — Assessment & Plan Note (Signed)
Persistent discomfort. Vicodin does help. He does not ask for more, but he would probably take more if given. Continue current dose, 3-4 tablets daily prn. He is amenable to this. He knows knee replacement is ultimate solution, but he does not want this at this time.

## 2012-08-19 ENCOUNTER — Ambulatory Visit: Payer: Medicaid Other | Admitting: Pharmacist

## 2012-08-20 ENCOUNTER — Ambulatory Visit (INDEPENDENT_AMBULATORY_CARE_PROVIDER_SITE_OTHER): Payer: Medicaid Other | Admitting: Family Medicine

## 2012-08-20 ENCOUNTER — Encounter: Payer: Self-pay | Admitting: Family Medicine

## 2012-08-20 VITALS — BP 147/92 | HR 79 | Temp 98.3°F | Ht 67.0 in | Wt 174.0 lb

## 2012-08-20 DIAGNOSIS — M79672 Pain in left foot: Secondary | ICD-10-CM

## 2012-08-20 DIAGNOSIS — M79609 Pain in unspecified limb: Secondary | ICD-10-CM

## 2012-08-20 MED ORDER — GABAPENTIN 300 MG PO CAPS: ORAL_CAPSULE | ORAL | Status: DC

## 2012-08-20 NOTE — Progress Notes (Signed)
S: Pt comes in today for SDA for L foot pain.  Of note, patient was seen by PCP yesterday for B knee pain.  Pt reports acute onset L foot pain since this AM.  Was fine last night.  No new or different activities.  Cannot think of any injury.  Does have significant knee pain.  Foot is tingling, like it's numb; pain is throbbing.  Whole foot is hurting, feels like numbness goes almost up to his knee but is worse in ankle and foot.  Vicodin, which usually helps his knees is not helping his foot.  No swelling or redness.    Was able to walk in from the car today.    ROS: Per HPI  History  Smoking status  . Never Smoker   Smokeless tobacco  . Never Used    O:  Filed Vitals:   08/20/12 1358  BP: 147/92  Pulse: 79  Temp: 98.3 F (36.8 C)    Gen: NAD L foot: no obvious deformity, erythema, or swelling; + significantly tender to light and deep palpation over dorsum and sides of foot as well as medial ankle but with minimal tenderness throughout entire shin, calf, and bottom of foot; full ROM although pt reports moderate pain with resisted plantar flexion and adduction and minimal pain with dorsal flexion and abduction    A/P: 46 y.o. male p/w L foot pain -See problem list -f/u in weeks

## 2012-08-20 NOTE — Assessment & Plan Note (Signed)
Unclear etiology, but pain description sounds neuropathic.  Precepted with Dr. Jennette Kettle who agrees with starting gabapentin and monitoring.  No focal area so fracture seems less likely, especially without trauma, so will wait on any kind of imaging.  Could this be the start of complex regional pain syndrome? F/u with PCP in 1-2 weeks

## 2012-08-20 NOTE — Patient Instructions (Signed)
It was nice to meet you today.  I'm sorry your foot is hurting.  We are going to try a different kind of pain medicine called gabapentin.  Take 1 pill 2 times per day for a few days then you can increase it to 3 times per day.  It may make you sleepy so don't take it before driving until you know how it will affect you.  You can still take your vicodin.  Come back to see Dr. Madolyn Frieze in the next 1-2 weeks.

## 2012-08-28 DIAGNOSIS — R0989 Other specified symptoms and signs involving the circulatory and respiratory systems: Secondary | ICD-10-CM

## 2012-08-28 DIAGNOSIS — R0609 Other forms of dyspnea: Secondary | ICD-10-CM

## 2012-08-28 DIAGNOSIS — G4761 Periodic limb movement disorder: Secondary | ICD-10-CM

## 2012-08-28 DIAGNOSIS — G4700 Insomnia, unspecified: Secondary | ICD-10-CM

## 2012-08-28 DIAGNOSIS — G4730 Sleep apnea, unspecified: Secondary | ICD-10-CM

## 2012-08-29 NOTE — Procedures (Cosign Needed)
NAME:  Andrew Huerta, Andrew Huerta NO.:  [id number]  MEDICAL RECORD NO.:  [id number]          PATIENT TYPE:  OUT  LOCATION:  SLEEP CENTER                 FACILITY:  [facility]  PHYSICIAN:  Clinton D. Maple Hudson, MD, FCCP, FACPDATE OF BIRTH:  06/11/66  DATE OF STUDY:  08/16/2012                           NOCTURNAL POLYSOMNOGRAM  REFERRING PHYSICIAN:  Priscella Mann, MD  INDICATION FOR PROCEDURE:  Insomnia with sleep apnea.  EPWORTH SLEEPINESS SCORE:  9/24.  BMI 26.6, weight 170 pounds, height 67 inches, neck 15 inches.  MEDICATIONS:  Home medications are charted and reviewed.  SLEEP ARCHITECTURE:  Total sleep time 341.5 minutes with sleep efficiency 79.6%.  Stage I was 13.6%, stage II 69.1%, stage III 3.4%, REM 13.9% of total sleep time.  Sleep latency 56.5 minutes, REM latency 94 minutes.  Awake after sleep onset 33 minutes.  Arousal index 62.7. Bedtime medication:  Carvedilol, benazepril, spironolactone, Nexium.  RESPIRATORY DATA:  Apnea/hypopnea index (AHI) 1.6 per hour.  A total of 9 events was scored including 2 obstructive apneas, 6 central apneas, 1 hypopnea.  Events were mostly associated with supine sleep position. REM AHI 0.  He did not meet criteria for a split night CPAP titration.  OXYGEN DATA:  Mild-to-moderate snoring with oxygen desaturation to a nadir of 93% and mean oxygen saturation through the study of 96.1% on room air.  CARDIAC DATA:  Normal sinus rhythm.  MOVEMENT/PARASOMNIA:  A total of 357 limb jerks were counted of which 84 were associated with arousals or awakening for periodic limb movement with arousal index of 14.8 per hour.  No bathroom trips.  IMPRESSION/RECOMMENDATION: 1. Sleep was fragmented by numerous brief arousals and awakenings     throughout the study. 2. Occasional respiratory event with sleep disturbance, AHI 1.6 per     hour, within normal limits.  The normal range for adults is from 0-     5 events per hour).  Mild-to-moderate  snoring with oxygen     desaturation to a nadir of 93% and mean oxygen saturation through     the study of 96.1% on room air. 3. Severe periodic limb movement with arousal syndrome.  A total of     357 limb jerks included 84 with arousal or awakening for periodic     limb movement with arousal index of 14.8 per hour.  This     corresponds to his reported     awareness of limb movement.  Consider trial of specific therapy     such as ReQuip or Mirapex if clinically appropriate.     Clinton D. Maple Hudson, MD, [facility], FACP Diplomate, American Board of Sleep Medicine    CDY/MEDQ  D:  08/28/2012 15:04:58  T:  08/28/2012 23:55:07  Job:  [id number]

## 2012-08-30 ENCOUNTER — Telehealth: Payer: Self-pay | Admitting: *Deleted

## 2012-08-30 NOTE — Telephone Encounter (Signed)
PA required for Nexium. Form placed in MD box. 

## 2012-09-01 ENCOUNTER — Telehealth: Payer: Self-pay | Admitting: Family Medicine

## 2012-09-01 MED ORDER — ROPINIROLE HCL 0.5 MG PO TABS: ORAL_TABLET | ORAL | Status: DC

## 2012-09-01 NOTE — Telephone Encounter (Signed)
We discussed sleep study that did not show significant sleep apnea, but did not restless legs.  He would like to try medication since sometimes his legs hurt in the morning.  Start Requip. Titrate as tolerated by 0.5 mg every week. Maximum 3 mg daily.

## 2012-09-03 ENCOUNTER — Ambulatory Visit: Payer: Medicaid Other | Admitting: Pharmacist

## 2012-09-03 NOTE — Telephone Encounter (Signed)
Approval received for Nexium. Pharmacy notified.

## 2012-09-06 ENCOUNTER — Ambulatory Visit (INDEPENDENT_AMBULATORY_CARE_PROVIDER_SITE_OTHER): Payer: Medicaid Other | Admitting: Pharmacist

## 2012-09-06 ENCOUNTER — Encounter: Payer: Self-pay | Admitting: Pharmacist

## 2012-09-06 VITALS — BP 140/94 | Ht 67.0 in | Wt 170.0 lb

## 2012-09-06 DIAGNOSIS — E2609 Other primary hyperaldosteronism: Secondary | ICD-10-CM

## 2012-09-06 DIAGNOSIS — I10 Essential (primary) hypertension: Secondary | ICD-10-CM

## 2012-09-06 DIAGNOSIS — E269 Hyperaldosteronism, unspecified: Secondary | ICD-10-CM

## 2012-09-06 NOTE — Progress Notes (Signed)
Subjective:    Patient ID: Andrew Huerta, male    DOB: 09/05/1965, 47 y.o.   MRN: [id number]  HPI  NAS WAFER presented to the clinic for 24-hour ambulatory blood pressure monitoring after referral by Dr. Madolyn Frieze.  Medication compliance is reported as good.  Discussed procedure for wearing the monitor and gave patient written instructions. Monitor was placed on non-dominant arm with instructions to return in the morning. Patient returned to the clinic and reported NO issues with device.   However, patient had an unusual day as he attended court with one son AND then went to his younger son's school as he had been caught "texting in school".   During these times his blood pressure was elevated more than the remaining portions of the day.   He was willing to repeat the test AND was provided another day to evaluate BP on January 7th and 8th.   He reported on-going chest pain in his arm which he rated at 4/10 during our conversation with return of BP monitor to clinic on AM of 09/08/2012.   He states this chest/left am pain is less than he used to have.  Additionally he states he has chest pain most days.  He states that rest helps and nitroglycerin provides minimal benefit.     His chest pain work up was deferred for additional evaluation to Dr. Earnest Bailey.   Current BP Medications Benazepril 20mg  BID  Carvedilol 12.5 mg BID Spironolactone 100 mg in AM and 50 mg in pm    Allergies  Allergen Reactions  . Cortisone Acetate Swelling    REACTION: unspecified  . Darvocet (Propoxyphene-Acetaminophen) Nausea And Vomiting  . Nsaids     Caused stomach ulcers in the past      Review of Systems     Objective:   Physical Exam  Last 3 Office BP readings: 147/92 mmHg 171/113 mmHg 122/82 mmHg  Today's Office BP reading: 140/94 mmHg (manual reading)  ABPM Study Data: Arm Placement left arm Readings from 1/6 and 1/7  (states slept very soundly and well)  Overall Mean 24hr BP:   138/88  mmHg HR: 79   Daytime Mean BP:  146/94 mmHg HR: 82   Nighttime Mean BP:  116/70 mmHg HR: 72   Dipping Pattern: yes  Sys:  20.3%   Dia: 25.4%  [normal dipping ~10-20%]  Readings from 1/7 and 1/8   (states was awakened from sleep with leg pain (restless leg pain) at 5:00 AM)  Overall Mean 24hr BP:   141/89 mmHg HR: 80   Daytime Mean BP:  145/92 mmHg HR: 81   Nighttime Mean BP:  127/80 mmHg HR: 76   Dipping Pattern: yes   Sys:   12.6%   Dia:  13.5%  - Noteworthy that nightime BP was lower prior to leg pain.     [normal dipping ~10-20%]     Non-hypertensive ABPM thresholds: daytime BP <135/85 mmHg, sleeptime BP <120/75 mmHg NICE Hypertension Guidelines ([place]) using ABPM: Stage I: >135/85 mmHg, Stage 2: >150/95 mmHg)   BMET    Component Value Date/Time   NA 140 06/15/2012 0927   K 5.1 06/15/2012 0927   CL 103 06/15/2012 0927   CO2 27 06/15/2012 0927   GLUCOSE 122* 06/15/2012 0927   BUN 17 06/15/2012 0927   CREATININE 0.80 06/15/2012 0927   CREATININE 0.81 12/24/2011 0700   CALCIUM 9.8 06/15/2012 0927   GFRNONAA >90 12/24/2011 0700   GFRAA >90 12/24/2011 0700  Assessment & Plan:   DILLARD PASCAL completed two days of 24-hour ambulatory blood pressure study with results that appear to be elevated by stress (attending court) and routinely only slightly higher than goal of < 140/90.  He is known to have adrenal issues.  Will increase spironolactone slightly to attempt overall improvement in daytime BP measurements.   He demonstrated a nocturnal dipping pattern that is normal.  Increased Spironolactone to 100mg  twice daily.  Reassess BP at next visit with Dr. Madolyn Frieze. Repeat BMET at next visit.   May consider additional agent (amlodipine) in the future.  Patient seen with Doris Cheadle PharmD, Pharmacy Resident.  Chronic Chest pain with current pain in office during visit on 09/08/2012 - Deferred to Dr. Earnest Bailey.

## 2012-09-08 NOTE — Patient Instructions (Addendum)
Continue Carvedilol.   Benazepril 20 mg twice a day   Spironolactone 100mg  (2 X 50mg  tablets) in the AM and the PM.  Next visit with Dr. Madolyn Frieze.

## 2012-09-09 NOTE — Assessment & Plan Note (Signed)
Andrew Huerta completed two days of 24-hour ambulatory blood pressure study with results that appear to be elevated by stress (attending court) and routinely only slightly higher than goal of < 140/90.  He is known to have adrenal issues.  Will increase spironolactone slightly to attempt overall improvement in daytime BP measurements.   He demonstrated a nocturnal dipping pattern that is normal.  Increased Spironolactone to 100mg  twice daily.  Reassess BP and BMET at next visit with Dr. Madolyn Frieze.  May consider additional agent (amlodipine) in the future.  Patient seen with Doris Cheadle PharmD, Pharmacy Resident.

## 2012-09-09 NOTE — Progress Notes (Addendum)
  Subjective:    Patient ID: Andrew Huerta, male    DOB: [birth year], 47 y.o.   MRN: [id number]  HPI Patient returned to pharmacy clinic on morning of 09/09/11 to review results of ambulatory blood pressure monitoring with clinical pharmacist.  Was asked to evaluate patient due to complaint of chest pain.  Patient reprots 4/10 chest pain.  Has been chronic.  Non exertional.  Has has extensive workup with PCP in the past year to include nuclear stress test and CT chest showing no cardiac or obvious source of chest pain.  Patient reports pain today similar in character to his chronic chest pain, and is actually better than it has been. He attributes this to his treatment with spironolactone.   Review of Systems     Objective:   Physical Exam        Assessment & Plan:  Noncardiac chest pain-overall improving.  No need for further evaluation today.  Will follow-up with PCP.

## 2012-09-09 NOTE — Assessment & Plan Note (Signed)
Andrew Huerta completed two days of 24-hour ambulatory blood pressure study with results that appear to be elevated by stress (attending court) and routinely only slightly higher than goal of < 140/90.  He is known to have adrenal issues.  Will increase spironolactone slightly to attempt overall improvement in daytime BP measurements.   He demonstrated a nocturnal dipping pattern that is normal.  Increased Spironolactone to 100mg  twice daily.

## 2012-09-10 NOTE — Progress Notes (Signed)
Patient ID: Andrew Huerta, male   DOB: [birth year], 47 y.o.   MRN: [id number] Reviewed: Agree with Dr. Macky Lower documentation and management.

## 2012-09-17 ENCOUNTER — Encounter: Payer: Self-pay | Admitting: Family Medicine

## 2012-09-17 ENCOUNTER — Ambulatory Visit (INDEPENDENT_AMBULATORY_CARE_PROVIDER_SITE_OTHER): Payer: Medicaid Other | Admitting: Family Medicine

## 2012-09-17 VITALS — BP 127/84 | HR 78 | Temp 98.5°F | Ht 67.0 in | Wt 170.0 lb

## 2012-09-17 DIAGNOSIS — IMO0002 Reserved for concepts with insufficient information to code with codable children: Secondary | ICD-10-CM

## 2012-09-17 DIAGNOSIS — M1710 Unilateral primary osteoarthritis, unspecified knee: Secondary | ICD-10-CM

## 2012-09-17 DIAGNOSIS — M79672 Pain in left foot: Secondary | ICD-10-CM

## 2012-09-17 DIAGNOSIS — R0689 Other abnormalities of breathing: Secondary | ICD-10-CM

## 2012-09-17 DIAGNOSIS — R6889 Other general symptoms and signs: Secondary | ICD-10-CM

## 2012-09-17 DIAGNOSIS — M79609 Pain in unspecified limb: Secondary | ICD-10-CM

## 2012-09-17 DIAGNOSIS — I10 Essential (primary) hypertension: Secondary | ICD-10-CM

## 2012-09-17 MED ORDER — HYDROCODONE-ACETAMINOPHEN 5-325 MG PO TABS: 1.0000 | ORAL_TABLET | Freq: Three times a day (TID) | ORAL | Status: DC | PRN

## 2012-09-17 MED ORDER — HYDROCODONE-ACETAMINOPHEN 5-500 MG PO TABS: 1.0000 | ORAL_TABLET | Freq: Three times a day (TID) | ORAL | Status: DC | PRN

## 2012-09-17 NOTE — Patient Instructions (Addendum)
It was nice to see you today Andrew Huerta I am encouraged by how much you care about your son  Try the arch supports for your shoes  Follow-up in 3 months for pain medicine refills

## 2012-09-17 NOTE — Assessment & Plan Note (Signed)
Controlled at this time, but tends to vary as 24-hour blood pressure monitoring has shown. Continue current medications.

## 2012-09-17 NOTE — Assessment & Plan Note (Signed)
It is improved. He tends to walk on lateral edges of feet, so recommending arch supports to see if it helps. Follow-up as needed.

## 2012-09-17 NOTE — Assessment & Plan Note (Signed)
Significant but stable. Continue Vicodin.  Patient is aware he will probably need knee surgery but is not ready for it at this time; he is worried he will have complications; he is fairly functional now with pain medicine.

## 2012-09-17 NOTE — Progress Notes (Signed)
  Subjective:    Patient ID: Andrew Huerta, male    DOB: 1966/01/26, 47 y.o.   MRN: [id number]  HPI Follow-up  # Left foot pain It is not bothering him that much any more. He thinks it may be due to how he walks, how he compensates for his knee pain.  He did not take gabapentin  # Restless legs He did not like taking ropirinole It made him feel strange, anxious, and difficult for him to sleep  # Bilateral knee DJD It is about the same He feels fairly functional with the Vicodin, 2-3 tablets daily. He is able to get around the house He has spoke with son's orthopedist Dr. Pecolia Ades during his son's visits. Patient is aware he will probably need knee surgery but is not ready for it at this time; he is worried he will have complications; he is fairly functional now  # Hypertension He is compliant with medications including medication increases after visit with Dr. Raymondo Band He denies lightheadedness or fatigue.  Review of Systems Per HPI  Allergies, medication, past medical history reviewed.      Objective:   Physical Exam Gen: NAD; thin PSYCH: pleasant but anxious-appearing (baseline), engaged and normally conversant, appropriate to questions, alert and oriented CV: RRR, normal S1/S2, no m/r/g PULM: NI WOB; CTAB without w/r/r ABD: soft, NT, ND EXT: no edema SKIN: warm, dry, no rash  FOOT: no pes planus but he tends to walk on the outside of his feet    Assessment & Plan:

## 2012-10-11 ENCOUNTER — Other Ambulatory Visit: Payer: Self-pay | Admitting: Family Medicine

## 2012-10-25 ENCOUNTER — Ambulatory Visit (HOSPITAL_COMMUNITY)
Admission: RE | Admit: 2012-10-25 | Discharge: 2012-10-25 | Disposition: A | Discharge status: Home / Self Care | Payer: Medicaid Other | Source: Ambulatory Visit | Attending: Family Medicine | Admitting: Family Medicine

## 2012-10-25 ENCOUNTER — Encounter: Payer: Self-pay | Admitting: Family Medicine

## 2012-10-25 ENCOUNTER — Ambulatory Visit (INDEPENDENT_AMBULATORY_CARE_PROVIDER_SITE_OTHER): Payer: Medicaid Other | Admitting: Family Medicine

## 2012-10-25 VITALS — BP 123/78 | HR 86 | Temp 98.3°F | Ht 67.0 in | Wt 169.0 lb

## 2012-10-25 DIAGNOSIS — R079 Chest pain, unspecified: Secondary | ICD-10-CM

## 2012-10-25 DIAGNOSIS — R0789 Other chest pain: Secondary | ICD-10-CM

## 2012-10-25 LAB — BASIC METABOLIC PANEL
CO2: 26 mEq/L (ref 19–32)
Chloride: 104 mEq/L (ref 96–112)
Creat: 1.1 mg/dL (ref 0.50–1.35)
Glucose, Bld: 107 mg/dL — ABNORMAL HIGH (ref 70–99)

## 2012-10-25 MED ORDER — PROMETHAZINE HCL 12.5 MG PO TABS: 12.5000 mg | ORAL_TABLET | Freq: Three times a day (TID) | ORAL | Status: DC | PRN

## 2012-10-25 MED ORDER — CYCLOBENZAPRINE HCL 5 MG PO TABS: 5.0000 mg | ORAL_TABLET | Freq: Every evening | ORAL | Status: DC | PRN

## 2012-10-25 MED ORDER — ONDANSETRON HCL 4 MG PO TABS: 4.0000 mg | ORAL_TABLET | Freq: Three times a day (TID) | ORAL | Status: AC | PRN

## 2012-10-25 NOTE — Assessment & Plan Note (Addendum)
Acute on chronic chest pain. It appears musculoskeletal at this time: non-exertional, reproducible. Cardiac work-up in hospital 08/2011 was negative.  -Try warm compresses during day, Flexeril prn at nighttime -Check K due to history of this being abnormal -Given indications to go to ED or RTC

## 2012-10-25 NOTE — Progress Notes (Signed)
  Subjective:    Patient ID: Andrew Huerta, male    DOB: 12/08/1965, 47 y.o.   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  Smoking status noted.     Objective:   Physical Exam GEN: NAD; well-appearing MSK: tender along left SCM and along left occipital area, down left arm, and substernally/left-side of chest; neck pain worsens with left SCM stretched NECK: no nuchal rigidity; good neck ROM CV: RRR, no murmurs, normal S1/S2 PULM: NI WOB; CTAB without w/r/r EXT: no edema NEURO: moves all extremities well; alert and oriented fully; appropriate to all questions  ECG: Rate: regular Rhythm: regular Axis: normal left-axis Intervals and complexes: PR (normal 0.12-0.20): normal P-wave (normal <0.12 duration, 0.25 mv amplitude): normal QRS complex (normal 0.06-0.10): normal  QT prolongation (normal men ?0.44 sec; women ?0.45 to 0.46)? Yes @ 408 Q waves? None ST or T-wave abnormalities? None Hypertrophy? None Extra or skipped beats? None Overall interpretation: normal sinus rhythm    Assessment & Plan:

## 2012-10-25 NOTE — Patient Instructions (Addendum)
If your chest pain worsens, improves with rest/worsens with activity, try NTG. If it improves, take 4 baby aspirin and then go the emergency room.    We will check kidney function and potassium today.  If normal, I will send you a letter, if abnormal, I will call.

## 2012-10-26 ENCOUNTER — Encounter: Payer: Self-pay | Admitting: Family Medicine

## 2012-11-22 ENCOUNTER — Ambulatory Visit (INDEPENDENT_AMBULATORY_CARE_PROVIDER_SITE_OTHER): Payer: Medicaid Other | Admitting: Family Medicine

## 2012-11-22 ENCOUNTER — Encounter: Payer: Self-pay | Admitting: Family Medicine

## 2012-11-22 VITALS — BP 128/87 | HR 76 | Temp 98.1°F | Ht 67.0 in | Wt 173.0 lb

## 2012-11-22 DIAGNOSIS — R0789 Other chest pain: Secondary | ICD-10-CM

## 2012-11-22 DIAGNOSIS — I10 Essential (primary) hypertension: Secondary | ICD-10-CM

## 2012-11-22 MED ORDER — BENAZEPRIL HCL 40 MG PO TABS: 40.0000 mg | ORAL_TABLET | Freq: Every day | ORAL | Status: DC

## 2012-11-22 MED ORDER — HYDROCODONE-ACETAMINOPHEN 5-325 MG PO TABS: 1.0000 | ORAL_TABLET | Freq: Three times a day (TID) | ORAL | Status: DC | PRN

## 2012-11-22 NOTE — Assessment & Plan Note (Signed)
Controlled.  Continue current medications.

## 2012-11-22 NOTE — Assessment & Plan Note (Signed)
Acute on chronic chest pain resolved; likely MSK. Chronic chest pain persistent. It does not seem related to GERD, we ruled-out cardiac causes. It may be due to anxiety; we tried TCA but he was intolerant. We may consider starting anxiolytic/anti-depressant to see if this helps if he is interested in the future especially with his history of PTSD.

## 2012-11-22 NOTE — Progress Notes (Signed)
  Subjective:    Patient ID: Andrew Huerta, male    DOB: 1966/05/19, 47 y.o.   MRN: [id number]  HPI # HTN He checks when he has a headache or does not feel. It is usually 170s systolics at this time.  He does not check when he is well.   # Chest pain It is persistent, unchanged  Characteristics: left-sided pain, sometimes radiates down left arm sometimes does not; it feels like someone just punched him Associated with activity: no  Frequency: 4-6 times a week Duration: few minutes to 1-2 hours  Distressing scale: 6-7/10 We had discussed warm compresses and flexeril at that time. It did improve. Now he just has his "regular" chest pain.  He does not think this is associated with food.  May be associated with anxiety   Review of Systems Per HPI Denies difficulty breathing  Allergies, medication, past medical history reviewed.  Smoking status noted.     Objective:   Physical Exam GEN: NAD CHEST: mild tender to palpation substernum and left-chest CV: RRR, no m/r/g, normal S1/S2 PULM: NI WOB; CTAB    Assessment & Plan:

## 2012-11-22 NOTE — Patient Instructions (Signed)
Follow-up in 2-3 months for medication refill

## 2012-11-30 ENCOUNTER — Encounter: Payer: Self-pay | Admitting: Family Medicine

## 2012-11-30 ENCOUNTER — Ambulatory Visit (INDEPENDENT_AMBULATORY_CARE_PROVIDER_SITE_OTHER): Payer: Medicaid Other | Admitting: Family Medicine

## 2012-11-30 VITALS — BP 142/100 | HR 100 | Temp 97.9°F | Ht 67.0 in | Wt 165.7 lb

## 2012-11-30 DIAGNOSIS — R0789 Other chest pain: Secondary | ICD-10-CM | POA: Insufficient documentation

## 2012-11-30 NOTE — Assessment & Plan Note (Addendum)
Patient with recent injury to left chest. Given reproducibility on exam and with movement of left arm this likely represents strain of muscles between ribs. No red flags for chest pain with no substernal pain, radiation, associated diaphoresis or shortness of breath. Plan: advised patient to continue use of voltaren gel given inability to use NSAIDS due to stomach issues. To continue use of flexeril as long as this continues to provide relief. Given red flags for return to medical care. Advised to avoid heavy lifting. To return to care if symptoms don't improve.

## 2012-11-30 NOTE — Patient Instructions (Signed)
Nice to meet you today. Sorry that you've been in pain. You likely have strained the muscles between your ribs. Please continue to use the voltaren gel. You can use this up to 4 times per day. Continue to use the flexeril as needed if this continues to provide pain relief. If this does not continue to get better over the next couple of weeks, please return for a follow-up visit.  Chest Pain (Nonspecific) Chest pain has many causes. Your pain could be caused by something serious, such as a heart attack or a blood clot in the lungs. It could also be caused by something less serious, such as a chest bruise or a virus. Follow up with your doctor. More lab tests or other studies may be needed to find the cause of your pain. Most of the time, nonspecific chest pain will improve within 2 to 3 days of rest and mild pain medicine. HOME CARE  For chest bruises, you may put ice on the sore area for 15 to 20 minutes, 3 to 4 times a day. Do this only if it makes you or your child feel better.  Put ice in a plastic bag.  Place a towel between the skin and the bag.  Rest for the next 2 to 3 days.  Go back to work if the pain improves.  See your doctor if the pain lasts longer than 1 to 2 weeks.  Only take medicine as told by your doctor.  Quit smoking if you smoke. GET HELP RIGHT AWAY IF:   There is more pain or pain that spreads to the arm, neck, jaw, back, or belly (abdomen).  You or your child has shortness of breath.  You or your child coughs more than usual or coughs up blood.  You or your child has very bad back or belly pain, feels sick to his or her stomach (nauseous), or throws up (vomits).  You or your child has very bad weakness.  You or your child passes out (faints).  You or your child has a temperature by mouth above 102 F (38.9 C), not controlled by medicine. Any of these problems may be serious and may be an emergency. Do not wait to see if the problems will go away. Get  medical help right away. Call your local emergency services 911 in [place] Do not drive yourself to the hospital. MAKE SURE YOU:   Understand these instructions.  Will watch this condition.  Will get help right away if you or your child is not doing well or gets worse. Document Released: 02/04/2008 Document Revised: 11/10/2011 Document Reviewed: 02/04/2008 [facility] Patient Information 2013 [place], [place].

## 2012-11-30 NOTE — Progress Notes (Signed)
  Subjective:    Patient ID: Andrew Huerta, male    DOB: [birth year], 47 y.o.   MRN: [id number]  HPIPatient is a 47 yo male who presents for evaluation of muscle strain in chest.  States on Friday he was picking up a grocery bag and felt sudden pain and a pop on the left side of his chest. He can point to the specific area that hurts. Is worse with movement of LUE. Described as sharp and 8/10 pain. Is a tad bit better today. Denies radiation of pain and shortness of breath. Has tried voltaren gel and flexeril with some improvement. Is not made worse by exertion. Denies substernal pressure.  Review of Systems see HPI     Objective:   Physical Exam  Constitutional: He appears well-developed and well-nourished.  HENT:  Head: Normocephalic and atraumatic.  Cardiovascular: Normal rate, regular rhythm and normal heart sounds.  Exam reveals no gallop and no friction rub.   No murmur heard. Pulmonary/Chest: Effort normal and breath sounds normal. He has no wheezes. He has no rales. He exhibits tenderness (patient with TTP in left chest at mid-clavicular line in areas between 3rd-6th ribs, reproduces pain).  Musculoskeletal: He exhibits no edema.  Skin: Skin is warm and dry. He is not diaphoretic.  BP 142/100  Pulse 100  Temp(Src) 97.9 F (36.6 C) (Oral)  Ht 5\' 7"  (1.702 m)  Wt 165 lb 11.2 oz (75.161 kg)  BMI 25.95 kg/m2    Assessment & Plan:

## 2012-12-14 IMAGING — US IR VENOUS SAMPLING
1 series · 1 of 1 positions shown · non-contrast
Comparison: CT of the abdomen dated 12/05/2011

CLINICAL DATA: Adrenal adenoma, hypertension and hypokalemia.
Previous CT imaging shows a low density nodule in the left adrenal
gland.  Venous sampling has been requested to evaluate for
hyperfunctioning adenoma.

1.  ULTRASOUND GUIDANCE FOR VASCULAR ACCESS OF BOTH THE RIGHT
COMMON FEMORAL VEIN AND LEFT COMMON FEMORAL VEIN
2.  LEFT ADRENAL VEIN SAMPLING INCLUDING LEFT RENAL AND ADRENAL
VENOGRAPHY
3.  RIGHT ADRENAL VEIN SAMPLING INCLUDING RIGHT RENAL AND ADRENAL

[Series 1: sp venous sampling · portal-venous · 1 of 1 slices shown]
[im 1/1]
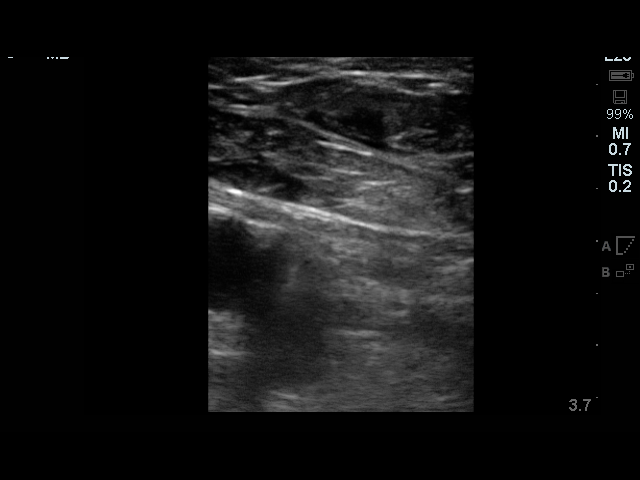

[1 of 1 positions shown; findings below may reference images not displayed]

Sedation: 6.0 mg IV Versed; 325 mcg IV Fentanyl.

Total Moderate Sedation Time: 185 minutes.

Contrast:  60 ml [id number]

Additional Medications: 50 mcg ACTH bolus infusion followed by
infusion at 50 mcg per hour prior to post stimulation sampling.

Fluoroscopy Time: 40.2 minutes.

Procedure:  The procedure, risks, benefits, and alternatives were
explained to the patient.  Questions regarding the procedure were
encouraged and answered.  The patient understands and consents to
the procedure.

The  was prepped with  in a sterile fashion, and a sterile drape
was applied covering the operative field.  A sterile gown and
sterile gloves were used for the procedure. Local anesthesia was
provided with 1% Lidocaine.

Peripheral blood was drawn just prior to the procedure and sent for
peripheral aldosterone and cortisol levels.  After bilateral groin
access, left renal venography was performed.  Various catheters
were advanced into the left renal vein.  Ultimately, a
microcatheter was advanced through the 5-French catheter in order
to a cannulate the left adrenal vein.  Contrast injection was
performed to confirm positioning within the adrenal vein.

Right renal venography was also performed.  The diagnostic catheter
was used to cannulate the orifice of the right adrenal vein with
contrast injection confirming position and stranding in the adrenal
parenchyma.  This was used for right adrenal vein sampling.
Adrenal vein samples were drawn prior to, and following, ACTH
stimulation.  Prior to adrenal vein sampling, pre stimulation
samples were drawn from the suprarenal and infrarenal segments of
the IVC for additional correlation. Post stimulation IVC sample was
also obtained.  Following the procedure, all catheters and sheaths
were removed and hemostasis obtained with manual compression.

Venous Sampling Site

Pre-stimulation

Right Adrenal

Aldosterone (ng/dL):
Cortisol (mcg/dL):
Aldosterone/Cortisol Ratio:

Left Adrenal

Aldosterone (ng/dL):        9007
Cortisol (mcg/dL):
Aldosterone/Cortisol Ratio:

Left Renal Vein

Aldosterone (ng/dL):           236
Cortisol (mcg/dL):
Aldosterone/Cortisol Ratio:

Suprarenal IVC

Aldosterone (ng/dL):
Cortisol (mcg/dL):
Aldosterone/Cortisol Ratio:

Infrarenal IVC

Aldosterone (ng/dL):         18
Cortisol (mcg/dL):
Aldosterone/Cortisol Ratio:

Peripheral Blood

Aldosterone (ng/dL):        11
Cortisol (mcg/dL):
Aldosterone/Cortisol Ratio:

Post-Stimulation

Right

Aldosterone (ng/dL):        2077
Cortisol (mcg/dL):
Aldosterone/Cortisol Ratio: 140

Left

Aldosterone (ng/dL):        25
Cortisol (mcg/dL):          850
Aldosterone/Cortisol Ratio:

IVC

Aldosterone (ng/dL):
Cortisol (mcg/dL):
Aldosterone/Cortisol Ratio:
IMPRESSION: The pre stimulation aldosterone levels and aldosterone to cortisol
ratios would suggest lateralization on the left with elevated
aldosterone production on the left compared to the right.  This is
also supported by the higher left renal vein levels compared to the
IVC and peripheral blood levels.

Post stimulation results do not confirm lateralization to the left
adrenal gland with significant elevation of right aldosterone from
the right adrenal vein after stimulation and significant elevation
of cortisol production from the left adrenal vein after
stimulation.

## 2012-12-16 ENCOUNTER — Encounter: Payer: Self-pay | Admitting: Family Medicine

## 2012-12-16 ENCOUNTER — Telehealth: Payer: Self-pay | Admitting: Family Medicine

## 2012-12-16 ENCOUNTER — Ambulatory Visit (INDEPENDENT_AMBULATORY_CARE_PROVIDER_SITE_OTHER): Payer: Medicaid Other | Admitting: Family Medicine

## 2012-12-16 VITALS — BP 167/100 | HR 84 | Temp 98.2°F | Ht 67.0 in | Wt 164.0 lb

## 2012-12-16 DIAGNOSIS — M542 Cervicalgia: Secondary | ICD-10-CM

## 2012-12-16 MED ORDER — CYCLOBENZAPRINE HCL 10 MG PO TABS: 10.0000 mg | ORAL_TABLET | Freq: Three times a day (TID) | ORAL | Status: DC | PRN

## 2012-12-16 NOTE — Assessment & Plan Note (Signed)
No red flags in exam or history.  Gave hand out for home exercise program.  Rx for flexeril.  Discussed ice/heat therapy. F/u with [place] in 1-2 weeks.
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
## 2012-12-16 NOTE — Patient Instructions (Addendum)
I am sorry your neck is hurting.  Please see the attached hand out about neck strains and do the exercises and stretches at home twice a day.   Also, try the flexeril for muscle spasm and ice and heat on your neck.    Please make an appointment to see Dr. Colin Mulders in 1-2 weeks.
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
## 2012-12-16 NOTE — Telephone Encounter (Signed)
Appt scheduled for today with overflow clinic at 2:00 pm.  Gaylene Brooks, RN

## 2012-12-22 ENCOUNTER — Ambulatory Visit (INDEPENDENT_AMBULATORY_CARE_PROVIDER_SITE_OTHER): Payer: Medicaid Other | Admitting: Family Medicine

## 2012-12-22 VITALS — BP 122/85 | HR 65 | Ht 67.0 in | Wt 163.0 lb

## 2012-12-22 DIAGNOSIS — R109 Unspecified abdominal pain: Secondary | ICD-10-CM | POA: Insufficient documentation

## 2012-12-22 LAB — BASIC METABOLIC PANEL
BUN: 17 mg/dL (ref 6–23)
Chloride: 101 mEq/L (ref 96–112)
Potassium: 4.2 mEq/L (ref 3.5–5.3)
Sodium: 139 mEq/L (ref 135–145)

## 2012-12-22 LAB — POCT URINALYSIS DIPSTICK
Bilirubin, UA: NEGATIVE
Glucose, UA: NEGATIVE
Nitrite, UA: NEGATIVE

## 2012-12-22 LAB — POCT UA - MICROSCOPIC ONLY

## 2012-12-22 MED ORDER — OXYCODONE HCL 10 MG PO TABS: 10.0000 mg | ORAL_TABLET | Freq: Four times a day (QID) | ORAL | Status: DC | PRN

## 2012-12-22 MED ORDER — PROMETHAZINE HCL 12.5 MG PO TABS: 12.5000 mg | ORAL_TABLET | Freq: Three times a day (TID) | ORAL | Status: DC | PRN

## 2012-12-22 NOTE — Progress Notes (Signed)
  Subjective:    Patient ID: Andrew Huerta, male    DOB: 09-19-1965, 47 y.o.   MRN: [id number]  HPI # He thinks he has a kidney stone. He endorses left-sided flank pain.  04/18 he threw-up all night and could not urinate. 04/20 he urinated gross blood.  Currently, he endorses a lot of pressure and back pain; pain was 10+ 04/18, but now it is about 8/10.   Fourth episode of kidney stone. He had only passed once.  He had undergone 3 procedures (lithotripsy, basket, and ?laparoscopic) to remove 5.5 mm stone on right kidney.  He also reports having stents placed in his kidney.  He has seen Dr. Ihor Gully at [facility] several years ago.  He has been trying to stay well hydrated. He denies throwing-up today.   Review of Systems  Per HPI Denies fevers/chills at this time; loose stool; dysuria     Objective:   Physical Exam GEN: appears uncomfortable CV: RRR PULM: NI WOB ABD: soft, NT, ND BACK: left-sided flank pain  CT abdomen (10/2011): (done to look at adrenal glands to evaluate for hyperaldosteronism)  Cluster of nonobstructing  left renal collecting system calculi measures 4 mm x 13 mm in the  interpolar to lower polar collecting system. An exophytic low-  density cyst is present in the right interpolar region of the  kidney.      Assessment & Plan:

## 2012-12-22 NOTE — Patient Instructions (Addendum)
We will refer you to Dr. Vernie Ammons  Take oxycodone 10 mg every 6 hours as needed Take Phenergan as needed for pain   Watch out for constipation on oxycodone  Take Miralax if you don't have a bowel movements for 2 days or more   Follow-up with me in 4 weeks or sooner if needed (when you need your Vicodin refilled)

## 2012-12-22 NOTE — Assessment & Plan Note (Addendum)
He has a history of several episodes of kidney stones. He has undergone multiple procedures for previous kidney stones through Dr. Vernie Ammons in the past. His current episode feels like previous episodes. He does not have significant hematuria today, but he endorses gross hematuria a few days ago. In light of persistent pain with nausea/vomiting, I do not think he has passed stone yet. He has only passed 3/4 past stones. CT abdomen 10/2011 showed 4 x 13 mm stone in left kidney.  -Rx Phenergan, oxycodone prn  -Check BMET today>>>WNL, including Cr 0.91 -Referral to Dr. Vernie Ammons for consultation

## 2012-12-30 IMAGING — CT CT CHEST W/ CM
2 of 4 series · 15 of 36 positions shown, 18 images · IV contrast (omnipaque)
Comparison: Chest x-ray 08/14/2011.  CT abdomen 12/05/2011.

CLINICAL DATA: Chest pain.

CT CHEST WITH CONTRAST
TECHNIQUE: Multidetector CT imaging of the chest was performed
following the standard protocol during bolus administration of
intravenous contrast.
Contrast: 80mL OMNIPAQUE IOHEXOL 300 MG/ML  SOLN

[Series 2: chest with · axial · 0.72mm/px · z∈[-313,-63]mm · 12 of 60 slices shown, 15 images]
[im 5/60  mediastinal]
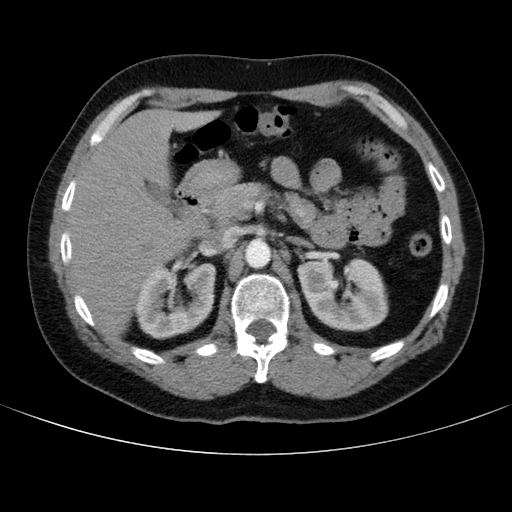
[im 5/60  lung]
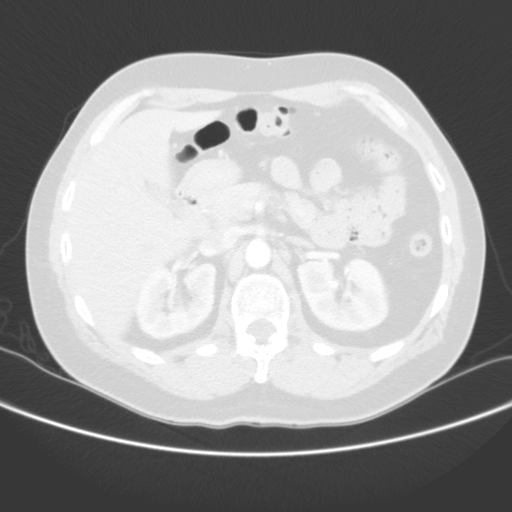
[im 9/60  lung]
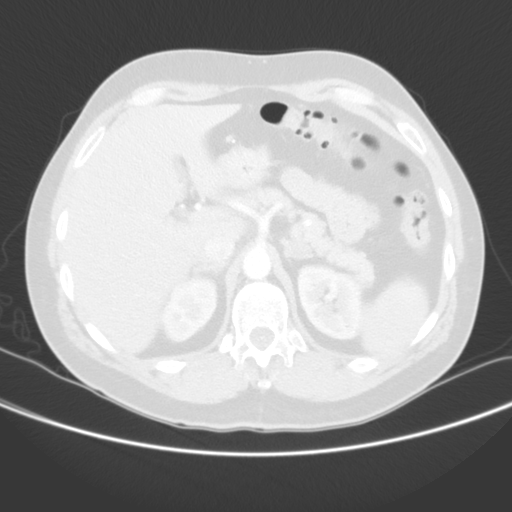
[im 13/60  lung]
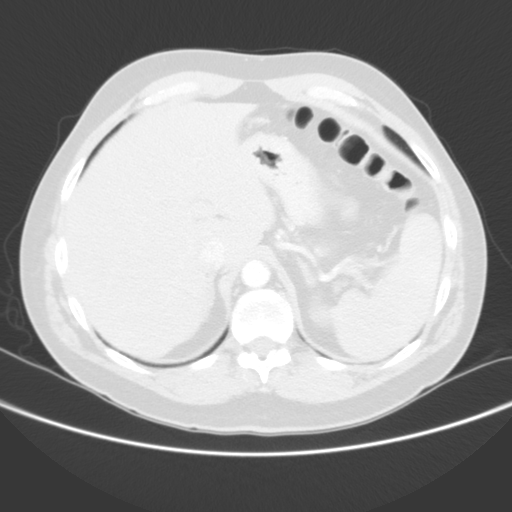
[im 17/60  lung]
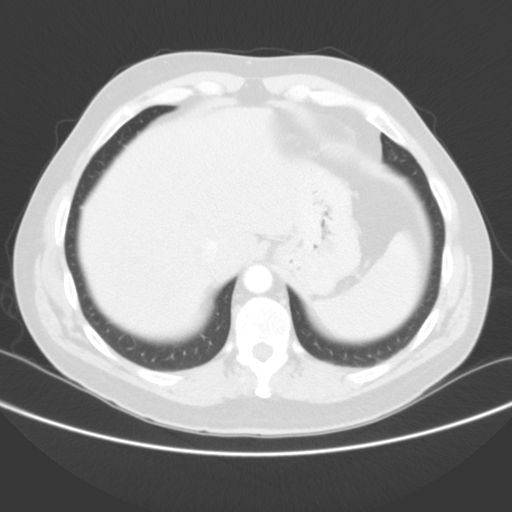
[im 22/60  mediastinal]
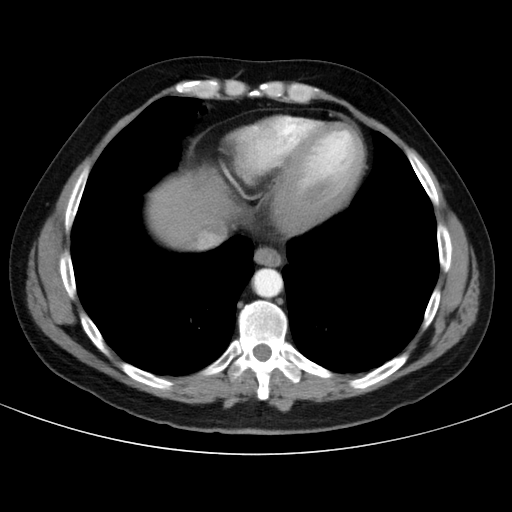
[im 22/60  lung]
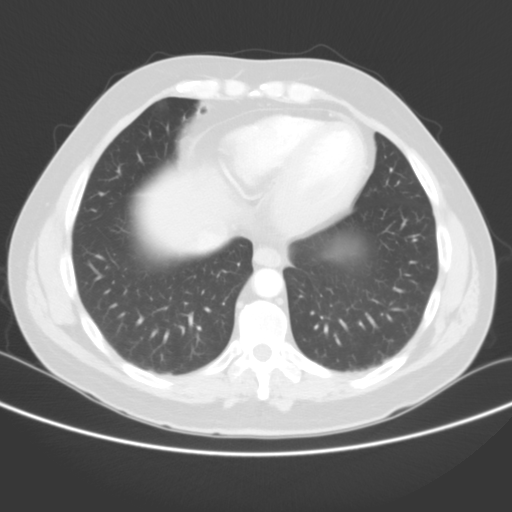
[im 26/60  lung]
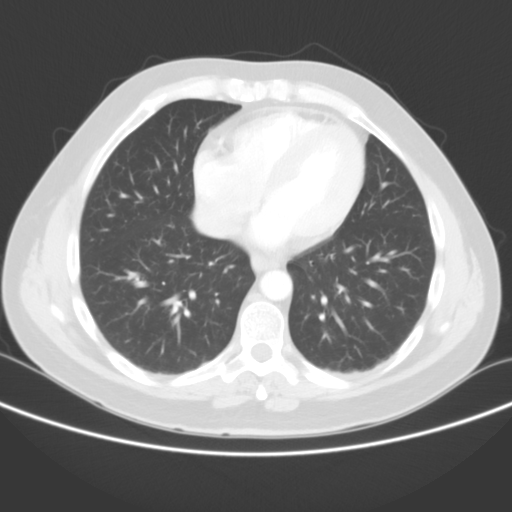
[im 34/60  lung]
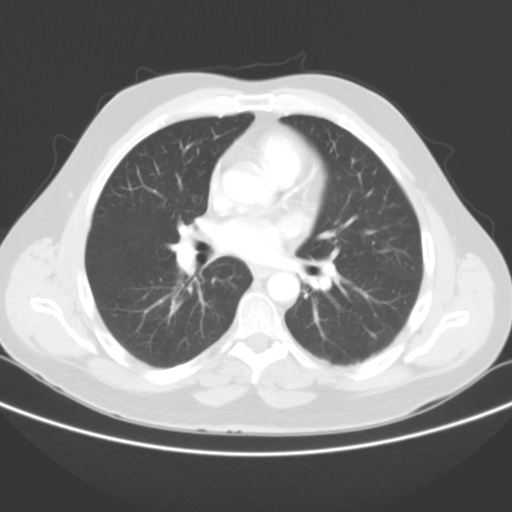
[im 38/60  lung]
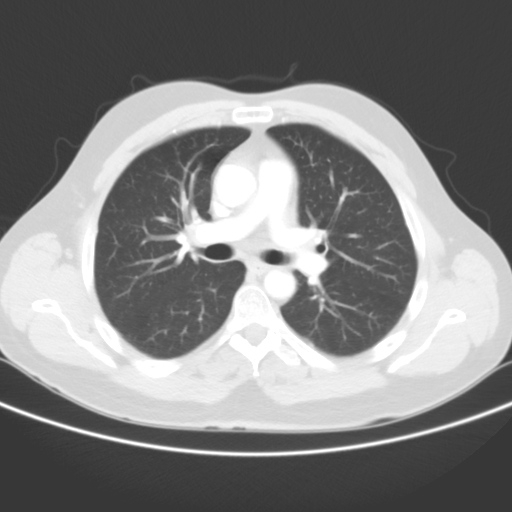
[im 43/60  mediastinal]
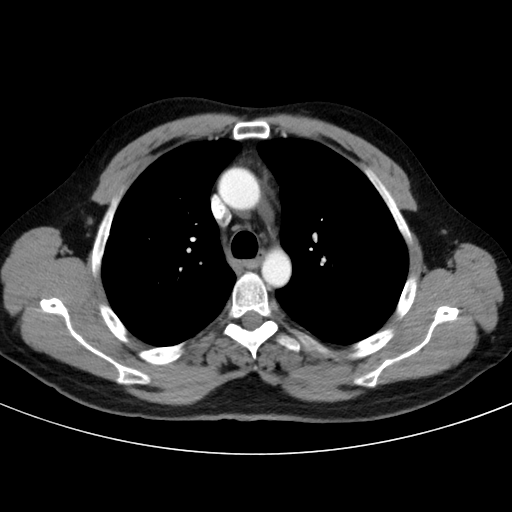
[im 43/60  lung]
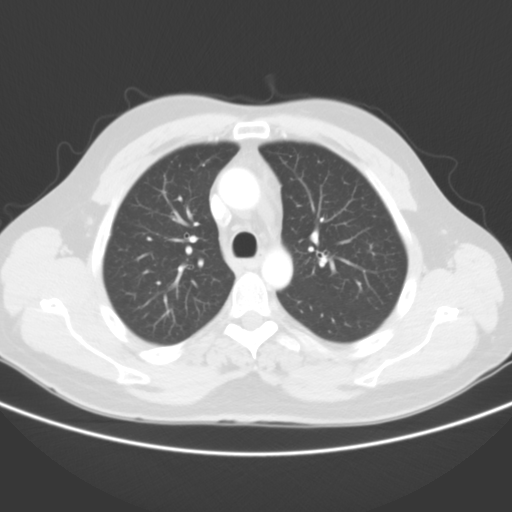
[im 47/60  lung]
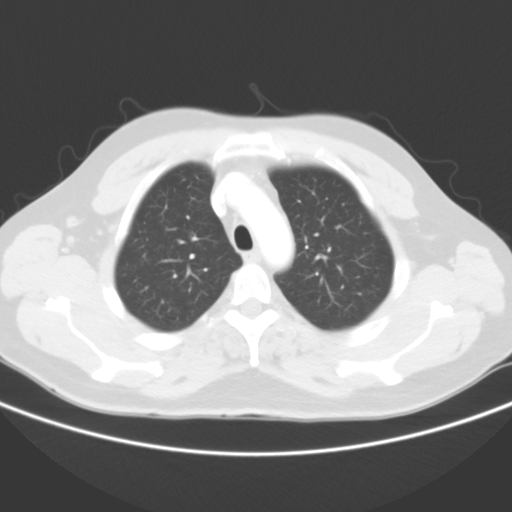
[im 51/60  lung]
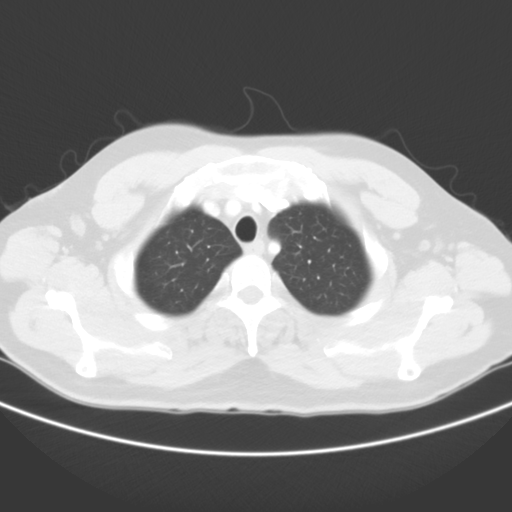
[im 55/60  lung]
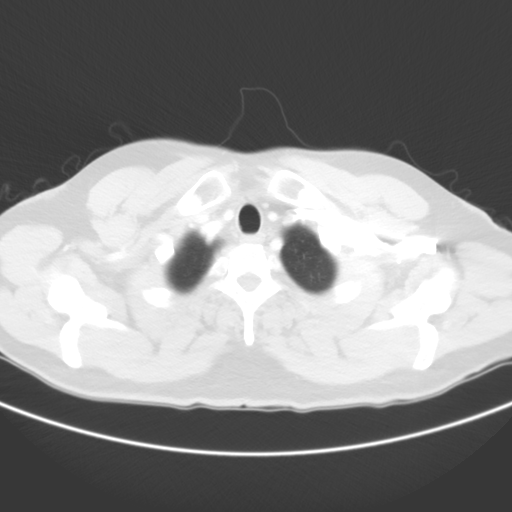

[coronals · coronal · 0.72mm/px · 3 of 89 slices shown]
[im 18/89  lung]
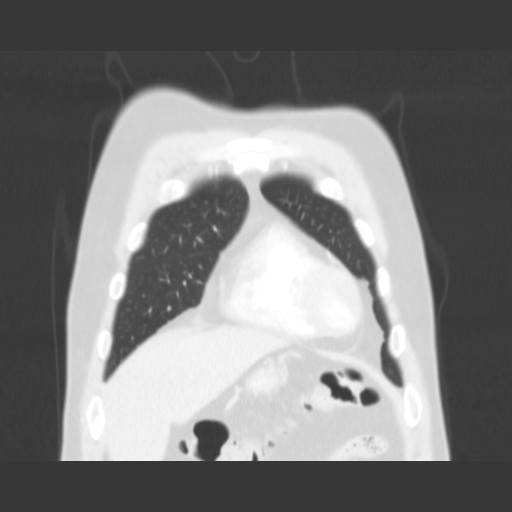
[im 36/89  lung]
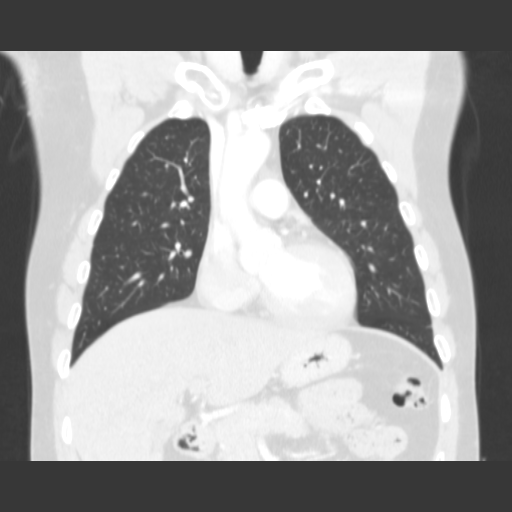
[im 53/89  lung]
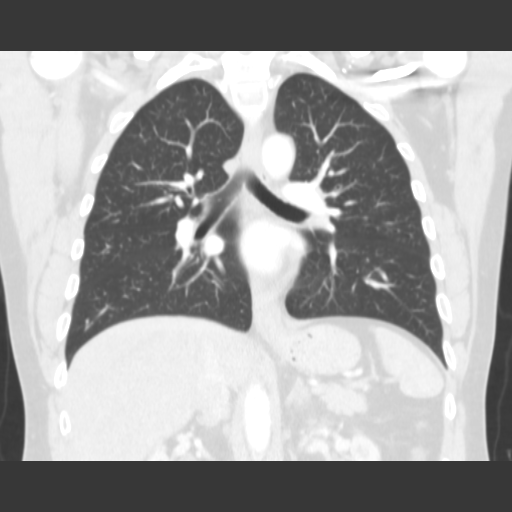

[15 of 36 positions shown; findings below may reference images not displayed]

FINDINGS: Lungs are clear.  No focal airspace opacities or
suspicious nodules.  No effusions. Heart is normal size. Aorta is
normal caliber.  Coronary artery calcifications visualized in the
left anterior descending coronary artery. No mediastinal, hilar, or
axillary adenopathy.  Visualized thyroid and chest wall soft
tissues unremarkable. Imaging into the upper abdomen shows no acute
findings.  Nonobstructing left renal stones again noted, unchanged.
No hydronephrosis.

No acute bony abnormality.
IMPRESSION: Scattered coronary artery calcifications in the left anterior
descending artery.

No acute findings in the chest.

## 2013-01-05 ENCOUNTER — Ambulatory Visit (INDEPENDENT_AMBULATORY_CARE_PROVIDER_SITE_OTHER): Payer: Medicaid Other | Admitting: Family Medicine

## 2013-01-05 ENCOUNTER — Encounter: Payer: Self-pay | Admitting: Family Medicine

## 2013-01-05 VITALS — BP 124/85 | HR 88 | Temp 97.4°F | Ht 67.0 in | Wt 160.0 lb

## 2013-01-05 DIAGNOSIS — G2581 Restless legs syndrome: Secondary | ICD-10-CM

## 2013-01-05 DIAGNOSIS — I10 Essential (primary) hypertension: Secondary | ICD-10-CM

## 2013-01-05 DIAGNOSIS — R634 Abnormal weight loss: Secondary | ICD-10-CM | POA: Insufficient documentation

## 2013-01-05 LAB — CBC
MCV: 88.1 fL (ref 78.0–100.0)
Platelets: 238 10*3/uL (ref 150–400)
RBC: 4.71 MIL/uL (ref 4.22–5.81)
RDW: 13.9 % (ref 11.5–15.5)
WBC: 10.2 10*3/uL (ref 4.0–10.5)

## 2013-01-05 LAB — IBC PANEL
%SAT: 30 % (ref 20–55)
TIBC: 355 ug/dL (ref 215–435)
UIBC: 250 ug/dL (ref 125–400)

## 2013-01-05 MED ORDER — OXYCODONE HCL 10 MG PO TABS: 10.0000 mg | ORAL_TABLET | Freq: Four times a day (QID) | ORAL | Status: DC | PRN

## 2013-01-05 MED ORDER — HYDROCODONE-ACETAMINOPHEN 5-325 MG PO TABS: 1.0000 | ORAL_TABLET | Freq: Three times a day (TID) | ORAL | Status: DC | PRN

## 2013-01-05 NOTE — Assessment & Plan Note (Signed)
Controlled in clinic but sometimes goes low He has history of high and low pressures Continue current medications for now. If persistently low, consider decreasing spironolactone.

## 2013-01-05 NOTE — Progress Notes (Signed)
  Subjective:    Patient ID: Andrew Huerta, male    DOB: 04-19-1966, 47 y.o.   MRN: [id number]  HPI # Left sided back pain, kidney stones He recently saw urologist. Was told that pieces of stone was breaking off.  He was started on a medication to help him pass stone.  He is taking ~1-2 oxycodone daily  # Hypertension Low blood pressure at urologist's SBP 107 He is feeling tired  # Weight loss Decreased appetite  He reports feeling gloomy  No new life stressors, changes  Sometimes he goes days without eating and sometimes he eats a lot (like 5 pieces of pizza) ROS: no changes in sleep; no SI/HI; denies difficulty falling asleep; he moves his legs constantly all night and he tosses and turns   Review of Systems Per HPI Endorses occasional nausea  Allergies, medication, past medical history reviewed.  Smoking status noted. He has sleep apnea but CPAP was not recommended     Objective:   Physical Exam GEN: NAD; well-nourished, -appearing CV: RRR PULM: NI WOB; CTAB without w/r/r BACK: mild lower lumbar pain EXT: no edema PSYCH: pleasant, not anxious or depressed appearing     Assessment & Plan:

## 2013-01-05 NOTE — Assessment & Plan Note (Signed)
He endorses fatigue.  -Check Hgb and iron for anemia -Consider gabapentin if no iron deficiency

## 2013-01-05 NOTE — Assessment & Plan Note (Signed)
Concern for depression secondary to dealing with significant chronic medical problems He also endorses fatigue -Check for anemia -Monitor for now; follow-up in 1 month; consider starting anti-depressant at that time if patient would like

## 2013-01-05 NOTE — Patient Instructions (Signed)
Follow-up in 1 month  If your blood pressure is consisently <100 on top and you feel particularly lightheaded, let me know   Let's check iron today

## 2013-01-06 ENCOUNTER — Telehealth: Payer: Self-pay | Admitting: Family Medicine

## 2013-01-06 MED ORDER — GABAPENTIN 300 MG PO CAPS: 300.0000 mg | ORAL_CAPSULE | Freq: Every day | ORAL | Status: DC

## 2013-01-06 NOTE — Telephone Encounter (Signed)
Notified normal iron studies  He does not drink caffeine, alcohol, use nicotine  For restless legs: -Discussed how exercising, warm compresses, leg massages may help -Start gabapentin 300 mg before bedtime; try for a week to see if it helps with fatigue and improves sleep

## 2013-01-14 ENCOUNTER — Other Ambulatory Visit: Payer: Self-pay | Admitting: *Deleted

## 2013-01-14 MED ORDER — ESOMEPRAZOLE MAGNESIUM 40 MG PO CPDR: 40.0000 mg | DELAYED_RELEASE_CAPSULE | Freq: Every day | ORAL | Status: DC

## 2013-02-09 ENCOUNTER — Ambulatory Visit (INDEPENDENT_AMBULATORY_CARE_PROVIDER_SITE_OTHER): Payer: Medicaid Other | Admitting: Family Medicine

## 2013-02-09 ENCOUNTER — Telehealth: Payer: Self-pay | Admitting: Clinical

## 2013-02-09 VITALS — BP 138/88 | HR 77 | Temp 97.6°F | Wt 160.0 lb

## 2013-02-09 DIAGNOSIS — F439 Reaction to severe stress, unspecified: Secondary | ICD-10-CM

## 2013-02-09 DIAGNOSIS — I10 Essential (primary) hypertension: Secondary | ICD-10-CM

## 2013-02-09 DIAGNOSIS — M1710 Unilateral primary osteoarthritis, unspecified knee: Secondary | ICD-10-CM

## 2013-02-09 DIAGNOSIS — Z733 Stress, not elsewhere classified: Secondary | ICD-10-CM

## 2013-02-09 MED ORDER — SPIRONOLACTONE 100 MG PO TABS: 100.0000 mg | ORAL_TABLET | Freq: Two times a day (BID) | ORAL | Status: DC

## 2013-02-09 MED ORDER — HYDROCODONE-ACETAMINOPHEN 5-325 MG PO TABS: 1.0000 | ORAL_TABLET | Freq: Three times a day (TID) | ORAL | Status: DC | PRN

## 2013-02-09 MED ORDER — BENAZEPRIL HCL 20 MG PO TABS: 20.0000 mg | ORAL_TABLET | Freq: Every day | ORAL | Status: DC

## 2013-02-09 NOTE — Patient Instructions (Addendum)
Follow-up in 2 weeks  Decrease benazepril from 40 to 20 mg daily Continue spironolactone and Coreg  We will have social worker get in touch with you Let me know if by Friday no one has gotten in touch with you

## 2013-02-09 NOTE — Assessment & Plan Note (Signed)
Endorses intermittent hypotensive episodes. Normo-tensive today despite pain and stressors today. Occasionally hypotensive at home.  -Decrease benazepril; continue Coreg and spironolactone
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
## 2013-02-09 NOTE — Assessment & Plan Note (Signed)
He is being evicted from his apartment. He pays $360 a month for rent; he earns $700 a month. He is having to give up his 2 children to his parents because he is not sure about his living situation. He is distressed today.  -Will see if social worker can provide any resources/help -Discussed anti-depressant for stress, PTSD, depression with his continued decreased appetite with weight loss and insomnia; he declines at this time due to concern for potential worsening of his anxiety; nortriptyline did not help him in the past (made his anxiety worse) as well as BDZ; follow-up in 2 weeks

## 2013-02-09 NOTE — Telephone Encounter (Signed)
Message copied by Theresia Bough on Wed Feb 09, 2013  4:20 PM ------      Message from: [facility] PARK, ANGELA J      Created: Wed Feb 09, 2013  9:32 AM       Please see A/P under "stress" ------

## 2013-02-09 NOTE — Assessment & Plan Note (Signed)
Stable. Refill on Vicodin x 3 months.

## 2013-02-09 NOTE — Telephone Encounter (Signed)
Clinical Child psychotherapist (CSW) received a referral to provide pt with housing resources. CSW provided pt with several contact numbers for affordable housing apartments. CSW will also mail pt a list of apartments within pt budget. Pt very appreciative and plans to contact the listings.  Theresia Bough, MSW, Theresia Majors [phone]

## 2013-02-10 NOTE — Telephone Encounter (Signed)
Thank you so much Nelva Bush for getting to him so promptly.

## 2013-02-23 ENCOUNTER — Encounter: Payer: Self-pay | Admitting: Family Medicine

## 2013-02-23 ENCOUNTER — Ambulatory Visit (INDEPENDENT_AMBULATORY_CARE_PROVIDER_SITE_OTHER): Payer: Medicaid Other | Admitting: Family Medicine

## 2013-02-23 VITALS — BP 138/87 | HR 71 | Temp 97.5°F | Wt 160.0 lb

## 2013-02-23 DIAGNOSIS — M1710 Unilateral primary osteoarthritis, unspecified knee: Secondary | ICD-10-CM

## 2013-02-23 DIAGNOSIS — F439 Reaction to severe stress, unspecified: Secondary | ICD-10-CM

## 2013-02-23 DIAGNOSIS — R0789 Other chest pain: Secondary | ICD-10-CM

## 2013-02-23 DIAGNOSIS — Z733 Stress, not elsewhere classified: Secondary | ICD-10-CM

## 2013-02-23 DIAGNOSIS — R300 Dysuria: Secondary | ICD-10-CM

## 2013-02-23 LAB — POCT URINALYSIS DIPSTICK
Bilirubin, UA: NEGATIVE
Nitrite, UA: NEGATIVE
Spec Grav, UA: 1.025
pH, UA: 6

## 2013-02-23 LAB — PSA: PSA: 0.51 ng/mL (ref <=4.00)

## 2013-02-23 LAB — POCT UA - MICROSCOPIC ONLY

## 2013-02-23 MED ORDER — CLONAZEPAM 0.5 MG PO TABS: 0.5000 mg | ORAL_TABLET | Freq: Two times a day (BID) | ORAL | Status: DC | PRN

## 2013-02-23 NOTE — Assessment & Plan Note (Signed)
Pain is tolerable on current dose of Vicodin. I would be hesitant to increase, although if pain worsens significantly would consider. He has been compliant with narcotics and keeps regular appointments with no concerning flags regarding abuse.

## 2013-02-23 NOTE — Progress Notes (Signed)
  Subjective:    Patient ID: Andrew Huerta, male    DOB: [birth year], 47 y.o.   MRN: [id number]  HPI # Stress He still needs to move out; he will live with his mother and girlfriend Youngest son to [place], older son's with his mother 2 of his family members diagnosed with cancer recently  # Decreased appetite, weight loss  Review of Systems Per HPI Endorses depression, frequent crying spells, worsening chest/knee/body pains Denies SI/HI  Allergies, medication, past medical history reviewed.  Smoking status noted.     Objective:   Physical Exam GEN: NAD PSYCH: appears anxious and depressed today; tearful; appropriate to questions; alert and oriented CV: RRR PULM: NI WOB; CTAB without w/r/r     Assessment & Plan:  > 35 spent counseling patient

## 2013-02-23 NOTE — Assessment & Plan Note (Addendum)
Deteriorated due to significant life stressors.  -Try Klonopin prn; he is endorsing more chest pain that may be due to panic attacks -I would continue this on a prn basis; he has been reliable with his narcotics and has been on a consistent dose for a while; I think if this does help calm him down and provide relief, continuing bid would be okay for the next several months -He is hesitant to try anti-depressants at this time due to feeling "zombie-like" with several; besides Cymbalta, which I had prescribed him, I am not sure what else he has tried; he is not interested in counseling at this time  -Follow-up in 2 weeks or sooner prn; before he needs more Klonopin

## 2013-02-23 NOTE — Assessment & Plan Note (Signed)
Documentation only. This has been extensively worked-up. May be associate with Barrett's although no significant relief with PPI, and he has had follow-up with GI which showed no change in Barrett's. Worrisome cardiac etiology unlikely with negative work-up by cardiologist. Anti-depressants had not helped, and he is hesitant to try alternative ones. He is willing to tolerate pain for now, reassured that does not appear worrisome at this time.

## 2013-02-23 NOTE — Patient Instructions (Addendum)
Try the Klonpin 1-2 tablets up to twice a day.  Follow-up in 2 weeks.

## 2013-03-03 ENCOUNTER — Telehealth: Payer: Self-pay | Admitting: Family Medicine

## 2013-03-03 NOTE — Telephone Encounter (Signed)
Pt calls to Emergency Line complaining of low blood pressure 75/55 mmHg taken at [facility] twice 15 min apart. He reports symptoms of being lightheaded and dizzy as well as mild headache. Denies focal motor deficit or numbness. He mentions took his BP meds this morning and has been drinking plenty of fluids. Denies nausea, vomiting, diarrhea, chest pain or palpitations. We recommend to increase amount of fluid PO and recheck BP if continues low and with symptoms he needs to get evaluated. Pt was appreciative of recommendations.

## 2013-03-14 ENCOUNTER — Ambulatory Visit (INDEPENDENT_AMBULATORY_CARE_PROVIDER_SITE_OTHER): Payer: Medicaid Other | Admitting: Family Medicine

## 2013-03-14 ENCOUNTER — Encounter: Payer: Self-pay | Admitting: Family Medicine

## 2013-03-14 ENCOUNTER — Ambulatory Visit: Payer: Medicaid Other | Admitting: Family Medicine

## 2013-03-14 VITALS — BP 128/90 | HR 91 | Temp 97.7°F | Ht 67.0 in | Wt 159.0 lb

## 2013-03-14 DIAGNOSIS — I10 Essential (primary) hypertension: Secondary | ICD-10-CM

## 2013-03-14 DIAGNOSIS — Z733 Stress, not elsewhere classified: Secondary | ICD-10-CM

## 2013-03-14 DIAGNOSIS — F439 Reaction to severe stress, unspecified: Secondary | ICD-10-CM

## 2013-03-14 MED ORDER — SPIRONOLACTONE 100 MG PO TABS: 50.0000 mg | ORAL_TABLET | Freq: Two times a day (BID) | ORAL | Status: DC

## 2013-03-14 NOTE — Assessment & Plan Note (Signed)
Coreg d/c'd, spironolactone decreased to 50mg  BID since 7/3. Has still had some home recorded BPs below 90 systolic - Decrease benazepril from 20mg  to 10mg  - F/U again in 2 weeks, continue home BP log

## 2013-03-14 NOTE — Assessment & Plan Note (Signed)
Ongoing significant stressors - Using klonopin nightly, but has not run out of first prescription. This is not something I want to keep him on going forward, but he does have some acute stresses in the past month. - We will try and address this further in 2 weeks.

## 2013-03-14 NOTE — Patient Instructions (Addendum)
Please schedule for 2 weeks from now.  Benazepril is switched to 10mg  a day Spironolactone is still 50mg  twice a day

## 2013-03-14 NOTE — Progress Notes (Signed)
  Subjective:    Patient ID: Andrew Huerta, male    DOB: Mar 21, 1966, 47 y.o.   MRN: [id number]  HPI 47yo male with an extensive history of blood pressure control problems, social stressors, atypical chest pain worked up by cardiologist, severe degenerative joint disease in both knees.  # Hospital follow up - 7/3 [facility] - Low BPs, systolic in 60-70 range. He felt fatigued, and says he "just didn't feel right". Again had his chest pain that has been previously worked up by a cardiologist - Will request the records  # Hypertension - Since ED discharge carvedilol was discontinued, spironolactone decreased from 100mg  BID to 50mg  BID - Has kept a log using pharmacy BP device, range mostly from 100-120s systolic but had a low of 85 twice and high of 142.  - High BPs in the last few days, he hasn't been taking the antihypertensive meds to see if he would feel better/less fatigued  # Social stress - Moved out of evicted apartment - Living at girlfriend and mother's house - Children are back and forth between living situation  Review of Systems  Constitutional: Positive for appetite change, fatigue and unexpected weight change. Negative for activity change.  HENT: Negative.   Eyes: Negative.   Respiratory: Negative.   Cardiovascular: Positive for chest pain.       Recurrent and ongoing       Objective:   Physical Exam  Filed Vitals:   03/14/13 1338 03/14/13 1444 03/14/13 1445  BP: 126/87 138/94 128/90  Pulse: 91    Temp: 97.7 F (36.5 C)    TempSrc: Oral    Height: 5\' 7"  (1.702 m)    Weight: 159 lb (72.122 kg)     BP sitting, left and right arms: 138/94 BP standing, right arm: 128/90  General: NAD but does seem anxious, especially to meet me CV: normal s1/s2, RRR, no M/R/G Resp: CTAB, good effort      Assessment & Plan:  See problem list documentation

## 2013-03-15 LAB — RENAL FUNCTION PANEL
BUN: 13 mg/dL (ref 6–23)
CO2: 25 mEq/L (ref 19–32)
Chloride: 104 mEq/L (ref 96–112)
Creat: 0.83 mg/dL (ref 0.50–1.35)
Glucose, Bld: 69 mg/dL — ABNORMAL LOW (ref 70–99)

## 2013-03-21 ENCOUNTER — Telehealth: Payer: Self-pay | Admitting: *Deleted

## 2013-03-21 ENCOUNTER — Other Ambulatory Visit: Payer: Self-pay | Admitting: *Deleted

## 2013-03-21 ENCOUNTER — Ambulatory Visit: Payer: Medicaid Other | Admitting: Family Medicine

## 2013-03-21 MED ORDER — CLONAZEPAM 0.5 MG PO TABS: 0.5000 mg | ORAL_TABLET | Freq: Two times a day (BID) | ORAL | Status: DC | PRN

## 2013-03-21 NOTE — Telephone Encounter (Signed)
Spoke with patient and informed him that rx is up front for pick up. He asked me to call it in to [facility]. RX called in

## 2013-03-29 ENCOUNTER — Telehealth: Payer: Self-pay | Admitting: Family Medicine

## 2013-03-29 NOTE — Telephone Encounter (Signed)
Pt called to report his Bp today 75/43  And his pulse was 93 was just wanting know if that's okay. JW

## 2013-03-29 NOTE — Telephone Encounter (Signed)
Pt called and states that he feels slightly lighted headed and "drunk". Dr. Jennette Kettle consulted  and  Pt sent to ED for further eval. Wyatt Haste, RN-BSN

## 2013-04-01 ENCOUNTER — Ambulatory Visit (INDEPENDENT_AMBULATORY_CARE_PROVIDER_SITE_OTHER): Payer: Medicaid Other | Admitting: Family Medicine

## 2013-04-01 ENCOUNTER — Encounter: Payer: Self-pay | Admitting: Family Medicine

## 2013-04-01 ENCOUNTER — Ambulatory Visit: Payer: Medicaid Other | Admitting: Family Medicine

## 2013-04-01 VITALS — BP 121/86 | HR 86 | Temp 97.6°F | Ht 67.0 in | Wt 158.5 lb

## 2013-04-01 DIAGNOSIS — E269 Hyperaldosteronism, unspecified: Secondary | ICD-10-CM

## 2013-04-01 DIAGNOSIS — R51 Headache: Secondary | ICD-10-CM

## 2013-04-01 DIAGNOSIS — E2609 Other primary hyperaldosteronism: Secondary | ICD-10-CM

## 2013-04-01 DIAGNOSIS — I10 Essential (primary) hypertension: Secondary | ICD-10-CM

## 2013-04-01 MED ORDER — CLONIDINE HCL 0.1 MG PO TABS: 0.1000 mg | ORAL_TABLET | Freq: Once | ORAL | Status: DC | PRN

## 2013-04-01 MED ORDER — BENAZEPRIL HCL 10 MG PO TABS: 5.0000 mg | ORAL_TABLET | Freq: Every day | ORAL | Status: DC

## 2013-04-01 NOTE — Assessment & Plan Note (Addendum)
Ongoing, OTC medications and vicodin (for osteoarthritis of knees) have never worked. He does not want to try any other pain medications. He thinks it is all related to his BP and adrenal glands.

## 2013-04-01 NOTE — Assessment & Plan Note (Signed)
BPs have been trending low still, and had second ED visit in a month for hypotension. Will continue to decrease benazepril to 5mg  daily (down from 10mg  2 weeks ago, 20mg  before that). Will give clonidine to take PRN if his systolic BP >160.

## 2013-04-01 NOTE — Patient Instructions (Signed)
Decrease Benazepril from 10mg  to 5mg . Keep Spironolactone at 50mg  twice a day If your blood pressure is over 160, take 1 Clonidine.  Referral to [facility]

## 2013-04-01 NOTE — Progress Notes (Signed)
Subjective:     Patient ID: Andrew Huerta, male   DOB: 07/26/1966, 47 y.o.   MRN: [id number]  HPI  # BP - Changed from 20mg  benazepril, 12.5mg  coreg, 100mg  spironolactone BID after 7/3 to 20mg  benazepril, 50mg  spironolactone BID. On 7/14 decreased benazepril to 10mg  due to low home BP measurements - Ranges over last 2 weeks 88-140s/60-95 with average somewhere around 105/80 since last visit, but again had episode of hypotension on 7/29 that he went to the ED for a home recording 73/55. Similar symptoms as on 7/3, was acting "drunk", slurring words - He thinks most of his complaints of chest pain, headaches, feeling "miserable" resolved once he was started on 200mg  spironolactone about 3 months ago.  # Chest pain - Ongoing and has been worked up several times, non-cardiac origin - Sharp, stab, left side, sometimes radiates down left arm  # Headaches - Gotten worse since first ED visit on 7/3 - Everyday, lasts all day, has not been without a headache all of July. - Left side, crushing pain. Rates 12/10 at worst, 5/10 at best. - Denies associated rhinorrhea, eye tearing, sweating, photophobia, phonophobia.  # Social stressors - Significant changes in past few months, evicted from apartment - Crying everyday for things he used to not (watching cartoons). - He thinks situation will get worse, he will need to take care of children since mother and grandmother will be unable to - Taking Clonazepam 2-3/day. Thinks this is the only thing that has helped  Review of Systems  Constitutional: Positive for malaise/fatigue. Negative for fever, chills and weight loss.  HENT: Negative for hearing loss and tinnitus.   Eyes: Positive for blurred vision. Negative for photophobia.  Respiratory: Negative for shortness of breath.   Cardiovascular: Positive for chest pain.  Gastrointestinal: Positive for nausea and abdominal pain. Negative for vomiting.  Genitourinary: Negative for urgency and frequency.   Musculoskeletal: Positive for joint pain.  Skin: Negative for rash.  Neurological: Positive for headaches.   Review of Systems  Constitutional: Positive for malaise/fatigue. Negative for fever, chills and weight loss.  HENT: Negative for hearing loss and tinnitus.   Eyes: Positive for blurred vision. Negative for photophobia.  Respiratory: Negative for shortness of breath.   Cardiovascular: Positive for chest pain.  Gastrointestinal: Positive for nausea and abdominal pain. Negative for vomiting.  Genitourinary: Negative for urgency and frequency.  Musculoskeletal: Positive for joint pain.  Skin: Negative for rash.  Neurological: Positive for headaches.       Objective:   Physical Exam Filed Vitals:   04/01/13 1425  BP: 121/86  Pulse: 86  Temp: 97.6 F (36.4 C)  Repeat blood pressure by me 124/84  General: sitting in chair, no acute distress, reply to how he feels when I first walked in: "like shit" Cardiac: RRR, normal s1/s2, no m/r/g Resp: CTAB, good effort Gait: walks with a slight limp favoring his left leg but able to get on exam table without assistance    Assessment and Plan:    See Problem list documentation

## 2013-04-01 NOTE — Assessment & Plan Note (Signed)
-   March 2013 Aldo:PRA ratio 183.3 - April 2013 CT scan shows nodule lateral limb left adrenal 15x24mm - April 2013 Selective venous sampling done and referred to Dr. Peggyann Shoals 02/05/2012 (General Surgeon in [place]), note mentions venous sampling results missing a value and a few time-stamps on handwritten labels not correct. She did not believe it needed surgical intervention, says results consistent with bilateral adrenal hyperplasia - Wants referral to [facility] in [facility], previously seen Dr. Lady Gary at [facility] but I do not see any scanned notes on chart review

## 2013-04-05 ENCOUNTER — Telehealth: Payer: Self-pay | Admitting: Family Medicine

## 2013-04-05 NOTE — Telephone Encounter (Signed)
Patient is calling because his blood pressures have dropped and he thinks he needs to let the triage nurse know.

## 2013-04-05 NOTE — Telephone Encounter (Signed)
Patient calling to let Dr. Waynetta Sandy know about his BP.  Patient states he usually has "high blood pressure," but lately it has been low.  Took BP earlier today (30-45 min after taking meds) and was 130/80.  BP yesterday and today 2 hours after taking med--80's/60's.  Had OV with Dr. Waynetta Sandy on 04/01/13 and BP meds were decreased.  Takes spironolactone 25 mg BID and benazepril 5 mg daily.  Continues to have headaches.  Will route note to Dr. Waynetta Sandy for advice and call patient back.  Gaylene Brooks, RN

## 2013-04-05 NOTE — Telephone Encounter (Signed)
I spoke with Andrew Huerta on the phone at 6:30pm tonight. The low blood pressures that he has had today and yesterday are still concerning so I told him to cut the spironolactone in half, 25mg  BID (currently 50mg  BID), continue benazepril at 5mg  daily. He will call in the next few days and let us know how his BPs have been. Tawni Carnes, MD

## 2013-04-21 ENCOUNTER — Telehealth: Payer: Self-pay | Admitting: Family Medicine

## 2013-04-21 NOTE — Telephone Encounter (Signed)
This could be a side effect from the spironolactone.  He needs to be seen and evaluated (preferrably by me since he is wary of seeing other providers) for this because if it is the medication then he needs to be switched to other BP medication (which he will not likely be amenable to). If you can't get him to be seen by me in the next few weeks then he needs to be seen by another provider. Thank you.

## 2013-04-21 NOTE — Telephone Encounter (Signed)
He should stop taking the medication at this time. Thank you.

## 2013-04-21 NOTE — Telephone Encounter (Signed)
Would you like him to continue med or stop it per pt? He has an appointment on 9/3 at 130 to see you. Wyatt Haste, RN-BSN

## 2013-04-21 NOTE — Telephone Encounter (Signed)
Please advise- Elizabeth Tiona Ruane, RN-BSN  

## 2013-04-21 NOTE — Telephone Encounter (Signed)
Pt is calling because he is having some side effects of one of his medications. His right breast is tender and now is growing. He would like to know what he should do. JW

## 2013-04-22 NOTE — Telephone Encounter (Signed)
Pt called and informed . Instructed to keep log of daily BP and call with readings on Monday to see if he needs further emergent eval. Thanks! Wyatt Haste, RN-BSN

## 2013-05-04 ENCOUNTER — Ambulatory Visit: Payer: Medicaid Other | Admitting: Family Medicine

## 2013-05-11 ENCOUNTER — Encounter (HOSPITAL_COMMUNITY): Payer: Self-pay | Admitting: Family Medicine

## 2013-05-11 ENCOUNTER — Telehealth: Payer: Self-pay | Admitting: Family Medicine

## 2013-05-11 ENCOUNTER — Emergency Department (HOSPITAL_COMMUNITY)
Admission: EM | Admit: 2013-05-11 | Discharge: 2013-05-11 | Disposition: A | Discharge status: Home / Self Care | Payer: Medicaid Other | Attending: Emergency Medicine | Admitting: Emergency Medicine

## 2013-05-11 DIAGNOSIS — Z87442 Personal history of urinary calculi: Secondary | ICD-10-CM | POA: Insufficient documentation

## 2013-05-11 DIAGNOSIS — F329 Major depressive disorder, single episode, unspecified: Secondary | ICD-10-CM | POA: Insufficient documentation

## 2013-05-11 DIAGNOSIS — F3289 Other specified depressive episodes: Secondary | ICD-10-CM | POA: Insufficient documentation

## 2013-05-11 DIAGNOSIS — I10 Essential (primary) hypertension: Secondary | ICD-10-CM | POA: Insufficient documentation

## 2013-05-11 DIAGNOSIS — K219 Gastro-esophageal reflux disease without esophagitis: Secondary | ICD-10-CM | POA: Insufficient documentation

## 2013-05-11 DIAGNOSIS — Z79899 Other long term (current) drug therapy: Secondary | ICD-10-CM | POA: Insufficient documentation

## 2013-05-11 DIAGNOSIS — F411 Generalized anxiety disorder: Secondary | ICD-10-CM | POA: Insufficient documentation

## 2013-05-11 DIAGNOSIS — Z8639 Personal history of other endocrine, nutritional and metabolic disease: Secondary | ICD-10-CM | POA: Insufficient documentation

## 2013-05-11 DIAGNOSIS — Z862 Personal history of diseases of the blood and blood-forming organs and certain disorders involving the immune mechanism: Secondary | ICD-10-CM | POA: Insufficient documentation

## 2013-05-11 DIAGNOSIS — Z8739 Personal history of other diseases of the musculoskeletal system and connective tissue: Secondary | ICD-10-CM | POA: Insufficient documentation

## 2013-05-11 MED ORDER — BENAZEPRIL HCL 10 MG PO TABS: 10.0000 mg | ORAL_TABLET | Freq: Every day | ORAL | Status: DC

## 2013-05-11 NOTE — ED Notes (Addendum)
Patient states "I checked my blood pressure and it read high. I called my doctor and he told me to come in." [facility]. States he was taken off spironolactone 3 weeks ago because he started to develop breasts. States he took his Benazapril at 11am and BP did not come down.

## 2013-05-11 NOTE — Telephone Encounter (Signed)
After hours line  Pt with hyperaldosteronism has elevated BP tonight of 180/115 and then 160/115. He states that he has Central chest pain radiating down his L arm and a headache. Neither the chest pain or the headache are new he says, but given that his BP is so high I feel we have no choice except to send him to the ED for hypertensive emergency.   He has benzapril that he takes if his BP is over 160, which he took at 11 am. He has someone that will bring him to the ED now.   Murtis Sink, MD [facility] Resident, PGY-2 05/11/2013, 8:42 PM

## 2013-05-11 NOTE — ED Provider Notes (Signed)
CSN: [id number]     Arrival date & time 05/11/13  2137 History   First MD Initiated Contact with Patient 05/11/13 2229     Chief Complaint  Patient presents with  . Hypertension   (Consider location/radiation/quality/duration/timing/severity/associated sxs/prior Treatment) Patient is a 47 y.o. male presenting with hypertension. The history is provided by the patient.  Hypertension This is a recurrent problem. Associated symptoms include chest pain and headaches. Pertinent negatives include no abdominal pain and no shortness of breath.   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 He had been on 3 blood pressure medicines and to begin have hypotension. He was taken off his medications except for spironolactone. He then developed breasts and was taken off that approximately 2 weeks ago. Since then his blood pressures been running high. He is on his benazepril 10 mg if his blood pressure goes above 160. He has been taking his blood pressure measurements and they have been running at least 140 for the last 2 weeks. He states he was 170/110 today. He had a headache and chest pain. He states he's had a headache and chest pain for a long time. He states it sometimes feels little different than his blood pressure is either elevated or low. No numbness weakness. No confusion. No difficulty seen. He has followup with [facility] for the tumors.  Past Medical History  Diagnosis Date  . Hypertension   . Hypokalemia   . Barrett's esophagus     egd - 11/27/09 - short segment of Barrett's  . Hyperlipidemia   . Degenerative joint disease     Bilateral knees. Significant knee pain since playing football in high school.   . Anxiety   . Low back pain   . Hypokalemia   . Sleep apnea   . Chronic kidney disease     stone  . Cancer     barrets diease of esophagus gets cancer test every 2 yrs  . GERD (gastroesophageal reflux disease)   . Headache(784.0)     when b/p get high  . Hiatal hernia     egd -  11/27/2009  . Depression   . Chest pain     NL Echo 08/2006   Past Surgical History  Procedure Laterality Date  . Knee surgery    . Lithotripsy    . Kidney stone surgery     No family history on file. History  Substance Use Topics  . Smoking status: Never Smoker   . Smokeless tobacco: Never Used  . Alcohol Use: 0.0 oz/week     Comment: Occasional    Review of Systems  Constitutional: Negative for activity change and appetite change.  HENT: Negative for neck stiffness.   Eyes: Negative for pain.  Respiratory: Negative for chest tightness and shortness of breath.   Cardiovascular: Positive for chest pain. Negative for leg swelling.  Gastrointestinal: Negative for nausea, vomiting, abdominal pain and diarrhea.  Genitourinary: Negative for flank pain.  Musculoskeletal: Negative for back pain.  Skin: Negative for rash.  Neurological: Positive for headaches. Negative for weakness and numbness.  Psychiatric/Behavioral: Negative for behavioral problems.    Allergies  Cortisone acetate; Darvocet; and Nsaids  Home Medications   Current Outpatient Rx  Name  Route  Sig  Dispense  Refill  . cyclobenzaprine (FLEXERIL) 10 MG tablet   Oral   Take 1 tablet (10 mg total) by mouth 3 (three) times daily as needed for muscle spasms.   40 tablet   0   . esomeprazole (  NEXIUM) 40 MG capsule   Oral   Take 1 capsule (40 mg total) by mouth daily before breakfast.   30 capsule   6   . gabapentin (NEURONTIN) 300 MG capsule   Oral   Take 1 capsule (300 mg total) by mouth at bedtime. Take 1-2 hours before bedtime for restless legs   30 capsule   0   . HYDROcodone-acetaminophen (NORCO/VICODIN) 5-325 MG per tablet   Oral   Take 1 tablet by mouth every 8 (eight) hours as needed for pain.   100 tablet   3   . promethazine (PHENERGAN) 12.5 MG tablet   Oral   Take 1 tablet (12.5 mg total) by mouth every 8 (eight) hours as needed for nausea.   20 tablet   0   . benazepril (LOTENSIN)  10 MG tablet   Oral   Take 1 tablet (10 mg total) by mouth daily.   30 tablet   0   . spironolactone (ALDACTONE) 100 MG tablet   Oral   Take 0.5 tablets (50 mg total) by mouth 2 (two) times daily.   60 tablet   3    BP 146/84  Pulse 71  Temp(Src) 98.1 F (36.7 C) (Oral)  Resp 15  Ht 5\' 7"  (1.702 m)  Wt 163 lb (73.936 kg)  BMI 25.52 kg/m2  SpO2 99% Physical Exam  Nursing note and vitals reviewed. Constitutional: He is oriented to person, place, and time. He appears well-developed and well-nourished.  HENT:  Head: Normocephalic and atraumatic.  Eyes: EOM are normal. Pupils are equal, round, and reactive to light.  Neck: Normal range of motion. Neck supple.  Cardiovascular: Normal rate, regular rhythm and normal heart sounds.   No murmur heard. Pulmonary/Chest: Effort normal and breath sounds normal.  Abdominal: Soft. Bowel sounds are normal. He exhibits no distension and no mass. There is no tenderness. There is no rebound and no guarding.  Musculoskeletal: Normal range of motion. He exhibits no edema.  Neurological: He is alert and oriented to person, place, and time. No cranial nerve deficit.  Skin: Skin is warm and dry.  Psychiatric: He has a normal mood and affect.    ED Course  Procedures (including critical care time) Labs Review Labs Reviewed - No data to display Imaging Review No results found.  Date: 05/11/2013  Rate: 64  Rhythm: normal sinus rhythm  QRS Axis: normal  Intervals: normal  ST/T Wave abnormalities: normal  Conduction Disutrbances: none  Narrative Interpretation: unremarkable   ; MDM   1. Hypertension    Patient with hypertension. Vitals improved. EKG reassuring. Will increase his benazepril to 10 mg daily. Will continue to keep track of her blood pressure. Doubt endorgan damage at this time.    Juliet Rude. Rubin Payor, MD 05/11/13 2340

## 2013-05-23 ENCOUNTER — Ambulatory Visit (INDEPENDENT_AMBULATORY_CARE_PROVIDER_SITE_OTHER): Payer: Medicaid Other | Admitting: Family Medicine

## 2013-05-23 ENCOUNTER — Encounter: Payer: Self-pay | Admitting: Family Medicine

## 2013-05-23 VITALS — BP 168/93 | Temp 98.3°F | Resp 16 | Ht 67.0 in | Wt 164.0 lb

## 2013-05-23 DIAGNOSIS — I10 Essential (primary) hypertension: Secondary | ICD-10-CM

## 2013-05-23 DIAGNOSIS — E269 Hyperaldosteronism, unspecified: Secondary | ICD-10-CM

## 2013-05-23 DIAGNOSIS — G2581 Restless legs syndrome: Secondary | ICD-10-CM

## 2013-05-23 DIAGNOSIS — E2609 Other primary hyperaldosteronism: Secondary | ICD-10-CM

## 2013-05-23 MED ORDER — GABAPENTIN 300 MG PO CAPS: 300.0000 mg | ORAL_CAPSULE | Freq: Every day | ORAL | Status: DC

## 2013-05-23 MED ORDER — BENAZEPRIL HCL 10 MG PO TABS: 20.0000 mg | ORAL_TABLET | Freq: Two times a day (BID) | ORAL | Status: DC

## 2013-05-23 NOTE — Assessment & Plan Note (Signed)
Seeing [facility] on 10/9, blood work for hyperaldo workup to be repeated at that time with possible repeat venous sampling to be done at [facility], awaiting this before starting eplerenone.

## 2013-05-23 NOTE — Assessment & Plan Note (Addendum)
Refill gabapentin today 

## 2013-05-23 NOTE — Assessment & Plan Note (Addendum)
No longer reporting episodes of hypotension. BPs around 160-70s normally. Spironolactone stopped due to gynecomastia. Benazepril increased by endocrine to 10mg  BID, but BPs still high. Plan: -Increase benazepril to 20mg  BID -Await results for hyperaldosteronism, likely eplerenone to be started at that time.

## 2013-05-23 NOTE — Patient Instructions (Signed)
Increase the benazepril to 20mg  twice a day. Continue to measure your blood pressures daily.  We will talk again about starting a statin drug and taking your daily baby aspirin.

## 2013-05-24 NOTE — Progress Notes (Signed)
  Subjective:    Patient ID: Andrew Huerta, male    DOB: 1966/05/14, 47 y.o.   MRN: [id number]  HPI  # Hypertension - Currently only on benazepril 10mg  BID. - Stopped recording BPs since they have all been high in 160s/90s - High of 180/110, went to [facility] emergency dept ROS: still gets daily headaches, chest pain. No SOB.  # Gynecomastia due to spironolactone use - First noticed around 8/22. Stopped taking it at that time. - We were in the process of lowering his dosage since July when he was on 200mg  daily. - Still feels the breast tissue, has not changed since stopping the medication - No Discharge  # Primary hyperaldosteronism - Saw endocrinologist at [facility] - Will have aldosterone levels checked on 10/9, if elevated plans on getting repeat venous sampling at [name] since last one done here had errors in collection.  Review of Systems See above    Objective:   Physical Exam BP 168/93  Temp(Src) 98.3 F (36.8 C) (Oral)  Resp 16  Ht 5\' 7"  (1.702 m)  Wt 164 lb (74.39 kg)  BMI 25.68 kg/m2  CV: RRR, normal S1/S2, no murmurs Resp: CTAB, effort normal Chest: tender to palpation over left side of sternum. Breast lump under right nipple, lump on right side above nipple not as large. Both areas tender Extremities: no leg edema, 2+ PT and radial pulses     Assessment & Plan:  See Problem List documentation

## 2013-05-31 ENCOUNTER — Telehealth: Payer: Self-pay | Admitting: Family Medicine

## 2013-05-31 NOTE — Telephone Encounter (Signed)
Patient called to say the medication he's taking for his blood pressure doesn't seem to be lowering his bp.  His reading 5 mins ago was 173/115.  Wanted to know if he should see his Endocrinologist or his provider.  His med dosage was increased by primary.  Please call him back to advise.

## 2013-05-31 NOTE — Telephone Encounter (Signed)
Will forward to MD. Andrew Huerta,CMA  

## 2013-05-31 NOTE — Telephone Encounter (Signed)
I called back Andrew Huerta, he said he laid down after he called and has not rechecked his BP yet. He took the benazepril 20mg  a few hours before the high BP reading. He has his baseline headache but is not complaining of any increased chest pain, blurry vision, or other concerning signs. I told him to re-check his BP and if it still high to call the endocrinologist, since we are waiting on starting eplerenone with him until his next visit scheduled 10/9 where there is planned blood work for his primary hyderaldosterone. I told him if he starts to get increased chest pain, his BP remains elevated, that he should be evaluated by our clinic, urgent care, or the ED and he agreed to this plan. [name]

## 2013-06-21 ENCOUNTER — Telehealth: Payer: Self-pay | Admitting: Family Medicine

## 2013-06-21 DIAGNOSIS — M159 Polyosteoarthritis, unspecified: Secondary | ICD-10-CM

## 2013-06-21 NOTE — Telephone Encounter (Signed)
Pt called and would like a refill on his hydrocodone left up front. JW

## 2013-06-22 MED ORDER — HYDROCODONE-ACETAMINOPHEN 5-325 MG PO TABS: 1.0000 | ORAL_TABLET | Freq: Three times a day (TID) | ORAL | Status: DC | PRN

## 2013-06-22 NOTE — Telephone Encounter (Signed)
Printed and signed, left in box at front. Can you call patient and let him know? Thank you. [name]

## 2013-06-22 NOTE — Addendum Note (Signed)
Addended by: Nani Ravens on: 06/22/2013 01:35 PM   Modules accepted: Orders

## 2013-06-22 NOTE — Telephone Encounter (Signed)
Pt is aware.  Yuya Vanwingerden,CMA  

## 2013-06-30 ENCOUNTER — Other Ambulatory Visit: Payer: Self-pay | Admitting: Family Medicine

## 2013-06-30 DIAGNOSIS — G2581 Restless legs syndrome: Secondary | ICD-10-CM

## 2013-06-30 MED ORDER — GABAPENTIN 300 MG PO CAPS: 300.0000 mg | ORAL_CAPSULE | Freq: Every day | ORAL | Status: DC

## 2013-07-04 ENCOUNTER — Telehealth: Payer: Self-pay | Admitting: *Deleted

## 2013-07-04 NOTE — Telephone Encounter (Signed)
Received a call from [facility] requesting NPI number for Mr sakai, they are sending him to a surgeon at [facility]. Number given.Renatta Shrieves, Rodena Medin

## 2013-07-14 ENCOUNTER — Telehealth: Payer: Self-pay | Admitting: Family Medicine

## 2013-07-14 NOTE — Telephone Encounter (Signed)
Dennie Bible with [facility] requested NPI. [facility] referred pt and Molly Maduro had given [facility]PI NPI given

## 2013-07-15 ENCOUNTER — Encounter (HOSPITAL_COMMUNITY): Payer: Self-pay | Admitting: Emergency Medicine

## 2013-07-15 ENCOUNTER — Emergency Department (HOSPITAL_COMMUNITY)
Admission: EM | Admit: 2013-07-15 | Discharge: 2013-07-15 | Disposition: A | Discharge status: Home / Self Care | Payer: Medicaid Other | Attending: Emergency Medicine | Admitting: Emergency Medicine

## 2013-07-15 DIAGNOSIS — Z87442 Personal history of urinary calculi: Secondary | ICD-10-CM | POA: Insufficient documentation

## 2013-07-15 DIAGNOSIS — E269 Hyperaldosteronism, unspecified: Secondary | ICD-10-CM | POA: Insufficient documentation

## 2013-07-15 DIAGNOSIS — N189 Chronic kidney disease, unspecified: Secondary | ICD-10-CM | POA: Insufficient documentation

## 2013-07-15 DIAGNOSIS — G8929 Other chronic pain: Secondary | ICD-10-CM | POA: Insufficient documentation

## 2013-07-15 DIAGNOSIS — K219 Gastro-esophageal reflux disease without esophagitis: Secondary | ICD-10-CM | POA: Insufficient documentation

## 2013-07-15 DIAGNOSIS — F329 Major depressive disorder, single episode, unspecified: Secondary | ICD-10-CM | POA: Insufficient documentation

## 2013-07-15 DIAGNOSIS — R079 Chest pain, unspecified: Secondary | ICD-10-CM | POA: Insufficient documentation

## 2013-07-15 DIAGNOSIS — IMO0002 Reserved for concepts with insufficient information to code with codable children: Secondary | ICD-10-CM | POA: Insufficient documentation

## 2013-07-15 DIAGNOSIS — I129 Hypertensive chronic kidney disease with stage 1 through stage 4 chronic kidney disease, or unspecified chronic kidney disease: Secondary | ICD-10-CM | POA: Insufficient documentation

## 2013-07-15 DIAGNOSIS — Z79899 Other long term (current) drug therapy: Secondary | ICD-10-CM | POA: Insufficient documentation

## 2013-07-15 DIAGNOSIS — F411 Generalized anxiety disorder: Secondary | ICD-10-CM | POA: Insufficient documentation

## 2013-07-15 DIAGNOSIS — Z8669 Personal history of other diseases of the nervous system and sense organs: Secondary | ICD-10-CM | POA: Insufficient documentation

## 2013-07-15 DIAGNOSIS — E876 Hypokalemia: Secondary | ICD-10-CM | POA: Insufficient documentation

## 2013-07-15 DIAGNOSIS — F3289 Other specified depressive episodes: Secondary | ICD-10-CM | POA: Insufficient documentation

## 2013-07-15 HISTORY — DX: Neoplasm of unspecified behavior of endocrine glands and other parts of nervous system: D49.7

## 2013-07-15 LAB — CBC
HCT: 42.2 % (ref 39.0–52.0)
MCV: 88.8 fL (ref 78.0–100.0)
Platelets: 194 10*3/uL (ref 150–400)
RBC: 4.75 MIL/uL (ref 4.22–5.81)
RDW: 13 % (ref 11.5–15.5)
WBC: 9.4 10*3/uL (ref 4.0–10.5)

## 2013-07-15 LAB — BASIC METABOLIC PANEL
CO2: 28 mEq/L (ref 19–32)
Chloride: 98 mEq/L (ref 96–112)
GFR calc Af Amer: >90 mL/min (ref >90)
Sodium: 138 mEq/L (ref 135–145)

## 2013-07-15 MED ORDER — POTASSIUM CHLORIDE CRYS ER 20 MEQ PO TBCR
40.0000 meq | EXTENDED_RELEASE_TABLET | Freq: Once | ORAL | Status: AC
  Administered 2013-07-15: 40 meq via ORAL
  Filled 2013-07-15: qty 2

## 2013-07-15 NOTE — ED Provider Notes (Signed)
CSN: [id number]     Arrival date & time 07/15/13  1504 History   First MD Initiated Contact with Patient 07/15/13 1506     Chief Complaint  Patient presents with  . Abnormal Lab   (Consider location/radiation/quality/duration/timing/severity/associated sxs/prior Treatment) Patient is a 47 y.o. male presenting with general illness. The history is provided by the patient.  Illness Location:  Generalized Quality:  Low potassium Severity:  Moderate Onset quality:  Gradual Timing:  Constant Progression:  Unchanged Chronicity:  Recurrent Context:  Hx of primary hyperaldosteronism, labs drawn this am Relieved by:  Nothing Worsened by:  Nothing Associated symptoms: chest pain (chronic, states he has it daily secondary to his hyperaldosteronism)   Associated symptoms: no abdominal pain, no cough, no fever, no shortness of breath and no vomiting     Past Medical History  Diagnosis Date  . Hypertension   . Hypokalemia   . Barrett's esophagus     egd - 11/27/09 - short segment of Barrett's  . Hyperlipidemia   . Degenerative joint disease     Bilateral knees. Significant knee pain since playing football in high school.   . Anxiety   . Low back pain   . Hypokalemia   . Sleep apnea   . Chronic kidney disease     stone  . Cancer     barrets diease of esophagus gets cancer test every 2 yrs  . GERD (gastroesophageal reflux disease)   . Headache(784.0)     when b/p get high  . Hiatal hernia     egd - 11/27/2009  . Depression   . Chest pain     NL Echo 08/2006  . Adrenal tumor    Past Surgical History  Procedure Laterality Date  . Knee surgery    . Lithotripsy    . Kidney stone surgery     History reviewed. No pertinent family history. History  Substance Use Topics  . Smoking status: Never Smoker   . Smokeless tobacco: Never Used  . Alcohol Use: 0.0 oz/week     Comment: Occasional    Review of Systems  Constitutional: Negative for fever.  Respiratory: Negative for cough  and shortness of breath.   Cardiovascular: Positive for chest pain (chronic, states he has it daily secondary to his hyperaldosteronism).  Gastrointestinal: Negative for vomiting and abdominal pain.  All other systems reviewed and are negative.    Allergies  Cortisone acetate; Darvocet; and Nsaids  Home Medications   Current Outpatient Rx  Name  Route  Sig  Dispense  Refill  . benazepril (LOTENSIN) 40 MG tablet   Oral   Take 40 mg by mouth daily.         Marland Kitchen esomeprazole (NEXIUM) 40 MG capsule   Oral   Take 1 capsule (40 mg total) by mouth daily before breakfast.   30 capsule   6   . gabapentin (NEURONTIN) 300 MG capsule   Oral   Take 300 mg by mouth daily.         Marland Kitchen HYDROcodone-acetaminophen (NORCO/VICODIN) 5-325 MG per tablet   Oral   Take 1 tablet by mouth every 8 (eight) hours as needed for pain.   100 tablet   0   . labetalol (NORMODYNE) 300 MG tablet   Oral   Take 300 mg by mouth 2 (two) times daily.          BP 173/94  Pulse 72  Temp(Src) 97.3 F (36.3 C) (Oral)  Resp 20  SpO2 100%  Physical Exam  Nursing note and vitals reviewed. Constitutional: He is oriented to person, place, and time. He appears well-developed and well-nourished. No distress.  HENT:  Head: Normocephalic and atraumatic.  Mouth/Throat: No oropharyngeal exudate.  Eyes: EOM are normal. Pupils are equal, round, and reactive to light.  Neck: Normal range of motion. Neck supple.  Cardiovascular: Normal rate and regular rhythm.  Exam reveals no friction rub.   No murmur heard. Pulmonary/Chest: Effort normal and breath sounds normal. No respiratory distress. He has no wheezes. He has no rales.  Abdominal: He exhibits no distension. There is no tenderness. There is no rebound.  Musculoskeletal: Normal range of motion. He exhibits no edema.  Neurological: He is alert and oriented to person, place, and time.  Skin: He is not diaphoretic.    ED Course  Procedures (including critical  care time) Labs Review Labs Reviewed  CBC  BASIC METABOLIC PANEL   Imaging Review No results found.  EKG Interpretation     Ventricular Rate:  77 PR Interval:  192 QRS Duration: 99 QT Interval:  415 QTC Calculation: 470 R Axis:   79 Text Interpretation:  Sinus rhythm Probable left atrial enlargement Anteroseptal infarct, old Q waves in V2, new from previous            MDM   1. Hypokalemia    13M presents with hypokalemia. Hx of primary hyperaldosteronism. Had labs drawn at [facility] this morning, they called him reporting his potassium was low.  K here 2.7. Patient given 40 mEq here, has replacement pills at home. Stable for discharge.    Dagmar Hait, MD 07/16/13 [phone]

## 2013-07-15 NOTE — ED Notes (Signed)
Critical lab value    Potassium 2.7

## 2013-07-15 NOTE — ED Notes (Signed)
Pt went for outpatient appointment today for bloodwork. Was called back for critical potassium of 2.1. Pt states he has adrenal ca. Denies any new symptoms.

## 2013-07-18 ENCOUNTER — Telehealth: Payer: Self-pay

## 2013-07-18 MED ORDER — POTASSIUM CHLORIDE CRYS ER 10 MEQ PO TBCR: 20.0000 meq | EXTENDED_RELEASE_TABLET | Freq: Every day | ORAL | Status: DC

## 2013-07-18 NOTE — Telephone Encounter (Signed)
Please advise patient that a new prescription for [facility]ium [facility] but that he needs to be seen in clinic with Dr. Waynetta Sandy or another provider before more refills can be given.  Thanks!

## 2013-07-18 NOTE — Telephone Encounter (Signed)
Was seen at [facility] on 11/14 and dx with Hypokalemia. Dr. Madolyn Frieze prescribed potasium supplements and discontinued them because they were not longer needed by patient at that time. Patient states the bottle he has at home is expired and will need a new RX for potasium.

## 2013-07-19 ENCOUNTER — Telehealth: Payer: Self-pay | Admitting: Family Medicine

## 2013-07-19 DIAGNOSIS — M159 Polyosteoarthritis, unspecified: Secondary | ICD-10-CM

## 2013-07-19 NOTE — Telephone Encounter (Signed)
Pt is aware of rx at pharmacy and made an appt to see pcp on 07/26/13.  Jazmin Hartsell,CMA

## 2013-07-19 NOTE — Telephone Encounter (Signed)
Pt called and needs a refill on his Vicodin left up front. JW

## 2013-07-19 NOTE — Telephone Encounter (Signed)
Will forward to MD. Pt has an appt 06/25/13 @ 8:30am with Dr. Waynetta Sandy. Brealynn Contino,CMA

## 2013-07-19 NOTE — Telephone Encounter (Signed)
It looks like he was given 100 pills on 10/22 so he should not need more at this time if he was taking them as directed. He can discuss refilling this medication with Dr. Waynetta Sandy at his visit next week.

## 2013-07-20 NOTE — Telephone Encounter (Signed)
Pt normally calls early for his medications so he does not run out. He states that since he has that "problem with my adrenal glands, I have terrible headaches and chest pain so there are some days that I may take 4 pills instead"  Pt says that he has 10 left and was just requesting some more since he will run out be fore appt. Axil Copeman, Maryjo Rochester

## 2013-07-22 MED ORDER — HYDROCODONE-ACETAMINOPHEN 5-325 MG PO TABS: 1.0000 | ORAL_TABLET | Freq: Three times a day (TID) | ORAL | Status: DC | PRN

## 2013-07-22 NOTE — Telephone Encounter (Signed)
Pt informed that Rx was up front.  Pt very appreciative. Fatou Dunnigan, Maryjo Rochester

## 2013-07-22 NOTE — Telephone Encounter (Signed)
I will go ahead and refill it if he wants to come pick it up today.

## 2013-07-26 ENCOUNTER — Ambulatory Visit (INDEPENDENT_AMBULATORY_CARE_PROVIDER_SITE_OTHER): Payer: Medicaid Other | Admitting: Family Medicine

## 2013-07-26 ENCOUNTER — Encounter: Payer: Self-pay | Admitting: Family Medicine

## 2013-07-26 VITALS — BP 153/104 | HR 82 | Temp 97.8°F | Ht 67.0 in | Wt 169.0 lb

## 2013-07-26 DIAGNOSIS — E876 Hypokalemia: Secondary | ICD-10-CM

## 2013-07-26 DIAGNOSIS — I10 Essential (primary) hypertension: Secondary | ICD-10-CM

## 2013-07-26 DIAGNOSIS — E269 Hyperaldosteronism, unspecified: Secondary | ICD-10-CM

## 2013-07-26 DIAGNOSIS — F329 Major depressive disorder, single episode, unspecified: Secondary | ICD-10-CM

## 2013-07-26 DIAGNOSIS — R51 Headache: Secondary | ICD-10-CM

## 2013-07-26 DIAGNOSIS — Z23 Encounter for immunization: Secondary | ICD-10-CM

## 2013-07-26 DIAGNOSIS — E2609 Other primary hyperaldosteronism: Secondary | ICD-10-CM

## 2013-07-26 MED ORDER — VENLAFAXINE HCL ER 37.5 MG PO CP24: 37.5000 mg | ORAL_CAPSULE | Freq: Every day | ORAL | Status: DC

## 2013-07-26 NOTE — Assessment & Plan Note (Signed)
Chronic. PHQ9 23, extremely difficult. Has no SI/HI. I've talked with him in the past about therapy but he continues to not want to go down this route. He agrees to starting venlafaxine 37.5mg  for dual benefit of mood and headache. Follow up in 2 weeks.

## 2013-07-26 NOTE — Assessment & Plan Note (Signed)
BPs remain elevated despite increased doses by endocrinology. It is likely that his BP may not be controlled until another aldosterone antagonist is used, which I will hold off on adding until workup by [facility] is finished. At this time no medication changes will be made, but will get a bmet to check his potassium

## 2013-07-26 NOTE — Progress Notes (Signed)
  Subjective:    Patient ID: Andrew Huerta, male    DOB: 09-Apr-1966, 47 y.o.   MRN: [id number]  HPI  # Headache - Has been consistently occuring every day for several months, but feels it is getting worse due to his blood pressure being out of control - Both sides of head, feels like head is being squeezed, throbs - He can't think of any time where it has gone away.  - Medications don't seem to help (hydrocodone he takes for arthritis). On chart review looks like he has tried sumatriptan and amitriptyline in the past  # Depression - chronic in nature, though he says it has actually been a bit better - able to get his son moved - No SI/HI - PHQ9 = 23, extremely difficult (scores: 2,3,3,3,3,3,3,2,1)  # Primary hyperaldosteronism - work up underway by [employer] - recently had "salt load" test - will have renal venous sampling test sometime in the future - blood pressure continues to be very high - found to have low potassium at ED and [facility] visits  # Hypokalemia - taking supplements in addition to eating "lots of bananas"  Review of Systems Per HPI    Objective:   Physical Exam BP 153/104  Pulse 82  Temp(Src) 97.8 F (36.6 C) (Oral)  Ht 5\' 7"  (1.702 m)  Wt 169 lb (76.658 kg)  BMI 26.46 kg/m2  General: NAD, sitting and seems more pleasant today than last visit. HEENT: PERRL, EOMI. CV: RRR, no murmurs Resp: CTAB Extremities: no edema, 2+ PT pulses bilaterally Neuro: CN grossly normal, strength 5/5 upper and lower extremities    Assessment & Plan:  See Problem List documentation

## 2013-07-26 NOTE — Patient Instructions (Addendum)
For your headache and also to help with mood: venlafaxine 37.5mg  once a day. Please follow up with me in 2 weeks.

## 2013-07-26 NOTE — Assessment & Plan Note (Addendum)
Workup underway by [facility] and [facility]. I have not received any documentation from [facility], but had salt-loading test with plan for renal venous sampling sometime in the future.

## 2013-07-26 NOTE — Assessment & Plan Note (Addendum)
Headaches are primary concern for him at this point. Does not appear to fit typical migraine or tension headache (starts when he wakes up and continuous throughout the day, every day). Will try venlafaxine 37.5mg  daily to help with both headache and mood.

## 2013-07-27 ENCOUNTER — Encounter: Payer: Self-pay | Admitting: Family Medicine

## 2013-07-27 LAB — BASIC METABOLIC PANEL
BUN: 12 mg/dL (ref 6–23)
CO2: 29 mEq/L (ref 19–32)
Chloride: 103 mEq/L (ref 96–112)
Creat: 0.75 mg/dL (ref 0.50–1.35)
Potassium: 4.1 mEq/L (ref 3.5–5.3)

## 2013-08-09 ENCOUNTER — Ambulatory Visit (INDEPENDENT_AMBULATORY_CARE_PROVIDER_SITE_OTHER): Payer: Medicaid Other | Admitting: Family Medicine

## 2013-08-09 ENCOUNTER — Encounter: Payer: Self-pay | Admitting: Family Medicine

## 2013-08-09 VITALS — BP 148/91 | HR 82 | Temp 98.0°F | Ht 67.0 in | Wt 164.0 lb

## 2013-08-09 DIAGNOSIS — E876 Hypokalemia: Secondary | ICD-10-CM

## 2013-08-09 DIAGNOSIS — F329 Major depressive disorder, single episode, unspecified: Secondary | ICD-10-CM

## 2013-08-09 DIAGNOSIS — R51 Headache: Secondary | ICD-10-CM

## 2013-08-09 LAB — BASIC METABOLIC PANEL
BUN: 9 mg/dL (ref 6–23)
Chloride: 101 mEq/L (ref 96–112)
Creat: 0.89 mg/dL (ref 0.50–1.35)
Glucose, Bld: 117 mg/dL — ABNORMAL HIGH (ref 70–99)
Potassium: 3 mEq/L — ABNORMAL LOW (ref 3.5–5.3)

## 2013-08-09 MED ORDER — CLONAZEPAM 0.5 MG PO TABS: 0.5000 mg | ORAL_TABLET | Freq: Two times a day (BID) | ORAL | Status: DC | PRN

## 2013-08-09 NOTE — Assessment & Plan Note (Addendum)
Did not start venlafaxine due to concern of warning on label for blood pressure. Discussed with him that this is a small percentage of patients, and only a small amount of increase based on much higher doses. He does not want to start this until after his BP is under control. Chart review showed previous use of amitriptyline, imitrex, both of which were ineffective. He is currently on labetalol and does not want to switch to propranol. He takes the vicodin for his headache, we discussed that there is the possibility that vicodin can make his headaches worse. The only thing that has given him some relief is klonopin, which he ran out of over the summer. Discussed with him that this is not a long term medication, it should be used sparingly and that he will need to think about seeing a therapist for his mood and PTSD. Klonopin #20 given 0 refills.

## 2013-08-09 NOTE — Progress Notes (Signed)
   Subjective:    Patient ID: Andrew Huerta, male    DOB: 1966-06-18, 47 y.o.   MRN: [id number]  Hypertension  Headache  His past medical history is significant for hypertension.    Here for f/u for headache and depression  # Headache - did not start Effexor because of blood pressure warning on box. Because he still gets periods of very high BP (190s/110s) he did not feel comfortable starting it until after BP is under control - still occurs almost every day, some days better than others - located in neck, both sides of head.  - feels like a crushing pain - today it is less severe, rates as 7/10 - takes the vicodin which helps somewhat. Has tried amitriptyline and imitrex in the past and they did not work, does not want to try these again. - feels that klonopin helped him the most, which was stopped over the summer  # PTSD / Depression - PHQ9 18;extremely difficult. (Scores: 2,2,2,3,2,3,2,1,1) - thinks he is more willing to see a therapist once the rest of his medical problems are more under control  # Hypokalemia - episode of hypokalemia last month (2.7), given supplements. - last check in office was normal (4.1) - has noticed some cramping in his feet over the past week  Review of Systems No dizziness. Occasional blurry vision that goes away after sleep. Chest pain (long history of this with previous workups, says it is non-exertional). No SOB. No diarrhea/constipation. Increased urinary frequency at night, no trouble starting but does dribble. Endorses continued muscle aches, cramping, joint aches (knees, back).    Objective:   Physical Exam BP 148/91  Pulse 82  Temp(Src) 98 F (36.7 C) (Oral)  Ht 5\' 7"  (1.702 m)  Wt 164 lb (74.39 kg)  BMI 25.68 kg/m2  General: NAD, accompanied by girlfriend HEENT: PERRL, EOMI CV: RRR, normal heart sounds, no murmurs. Mild tenderness to palpation of left sternal border Resp: CTAB, effort normal Ext: no cyanosis or edema. Neuro:  CN2-12 grossly normal, grip strength 5/5 bilaterally, toe extension 5/5 bilaterally.      Assessment & Plan:  See Problem List documentation

## 2013-08-09 NOTE — Assessment & Plan Note (Addendum)
See above documentation. Venlafaxine not started. PHQ9 18;extremely difficult. No SI/HI. Will re-consider starting venlafaxine once BP under better control. He demonstrated some willingness to start with a therapist after the workup with his kidneys/hyperaldosteronism is completed and treatment started.

## 2013-08-10 ENCOUNTER — Telehealth (HOSPITAL_COMMUNITY): Payer: Self-pay | Admitting: Family Medicine

## 2013-08-10 NOTE — Telephone Encounter (Signed)
Spoke with Mr. Andrew Huerta about his potassium of 3.0 yesterday. Instructed him to continue using 4 tablets of potassium ( total), if his muscle aches get worse to call the clinic and we will repeat the bmet. Will continue to monitor this closely, but it is likely similar to his first presentation of hyperaldo in 2012 where his potassium was consistently low. He says at his endocrinologist visit yesterday his labetalol was increased to 300mg  three times a day. -AW

## 2013-08-18 ENCOUNTER — Other Ambulatory Visit: Payer: Self-pay | Admitting: Family Medicine

## 2013-08-18 MED ORDER — ESOMEPRAZOLE MAGNESIUM 40 MG PO CPDR: 40.0000 mg | DELAYED_RELEASE_CAPSULE | Freq: Every day | ORAL | Status: DC

## 2013-08-22 ENCOUNTER — Telehealth: Payer: Self-pay | Admitting: Family Medicine

## 2013-08-22 NOTE — Telephone Encounter (Signed)
Needs refill on vicodine Please call when ready for pickup

## 2013-08-29 ENCOUNTER — Telehealth: Payer: Self-pay | Admitting: Family Medicine

## 2013-08-29 DIAGNOSIS — M159 Polyosteoarthritis, unspecified: Secondary | ICD-10-CM

## 2013-08-29 DIAGNOSIS — K219 Gastro-esophageal reflux disease without esophagitis: Secondary | ICD-10-CM

## 2013-08-29 MED ORDER — ESOMEPRAZOLE MAGNESIUM 40 MG PO CPDR: 40.0000 mg | DELAYED_RELEASE_CAPSULE | Freq: Every day | ORAL | Status: DC

## 2013-08-29 MED ORDER — HYDROCODONE-ACETAMINOPHEN 5-325 MG PO TABS: 1.0000 | ORAL_TABLET | Freq: Three times a day (TID) | ORAL | Status: DC | PRN

## 2013-08-29 NOTE — Telephone Encounter (Signed)
Called in nexium to pharmacy.  Please advise on hydrocodone. Jazmin Hartsell,CMA

## 2013-08-29 NOTE — Telephone Encounter (Signed)
Pt is calling because [facility]'t receive a refill request for his Nexium and he also needs a refill on his hydrocodone left up front. jw

## 2013-08-29 NOTE — Telephone Encounter (Signed)
Hydrocodone rx in box ready to be picked up. [facility] as I received a notice in my box from 12/22 about needing to be from another prescriber. [name]

## 2013-08-29 NOTE — Telephone Encounter (Signed)
Pt is aware.  Andrew Huerta,CMA  

## 2013-09-13 ENCOUNTER — Telehealth: Payer: Self-pay | Admitting: *Deleted

## 2013-09-13 DIAGNOSIS — E2609 Other primary hyperaldosteronism: Secondary | ICD-10-CM | POA: Insufficient documentation

## 2013-09-13 NOTE — Telephone Encounter (Signed)
Gave NPI to [facility] for authorization on this patient covering the month of January.Andrew Huerta, Kevin Fenton

## 2013-09-17 ENCOUNTER — Other Ambulatory Visit: Payer: Self-pay | Admitting: Family Medicine

## 2013-09-20 NOTE — Telephone Encounter (Signed)
Pt is aware.  Kitana Gage,CMA  

## 2013-09-26 ENCOUNTER — Encounter: Payer: Self-pay | Admitting: Family Medicine

## 2013-09-26 ENCOUNTER — Ambulatory Visit (INDEPENDENT_AMBULATORY_CARE_PROVIDER_SITE_OTHER): Payer: Medicaid Other | Admitting: Family Medicine

## 2013-09-26 VITALS — BP 167/124 | HR 92 | Temp 97.6°F | Ht 67.0 in | Wt 160.0 lb

## 2013-09-26 DIAGNOSIS — E2609 Other primary hyperaldosteronism: Secondary | ICD-10-CM

## 2013-09-26 DIAGNOSIS — I10 Essential (primary) hypertension: Secondary | ICD-10-CM

## 2013-09-26 DIAGNOSIS — E269 Hyperaldosteronism, unspecified: Secondary | ICD-10-CM

## 2013-09-26 DIAGNOSIS — M1710 Unilateral primary osteoarthritis, unspecified knee: Secondary | ICD-10-CM

## 2013-09-26 DIAGNOSIS — IMO0002 Reserved for concepts with insufficient information to code with codable children: Secondary | ICD-10-CM

## 2013-09-26 LAB — BASIC METABOLIC PANEL
BUN: 13 mg/dL (ref 6–23)
CHLORIDE: 100 meq/L (ref 96–112)
CO2: 31 meq/L (ref 19–32)
CREATININE: 0.85 mg/dL (ref 0.50–1.35)
Calcium: 9.7 mg/dL (ref 8.4–10.5)
Glucose, Bld: 105 mg/dL — ABNORMAL HIGH (ref 70–99)
Potassium: 3.4 mEq/L — ABNORMAL LOW (ref 3.5–5.3)
SODIUM: 140 meq/L (ref 135–145)

## 2013-09-26 MED ORDER — HYDROCODONE-ACETAMINOPHEN 5-325 MG PO TABS: 1.0000 | ORAL_TABLET | Freq: Three times a day (TID) | ORAL | Status: DC | PRN

## 2013-09-26 NOTE — Assessment & Plan Note (Signed)
Had adrenal venous sampling repeated by [facility], found elevated production coming from left adrenal; planned to have surgical removal by Dr. Cena Benton from [facility] next month.  His BP continues to be elevated/labile. Plan on repeat Bmet to check potassium (previously has had several low K+ 1 month ago), if still low we may need to start spironolactone for the short term.

## 2013-09-26 NOTE — Assessment & Plan Note (Signed)
Elevated again in clinic today, at this point hopefully the adrenalectomy will be the definitive treatment for his long standing BP issues. Plan to continue current meds (endocrinologist has been increasing them each visit) and possibly restart spironolactone/eplerenone if potassium is low.

## 2013-09-26 NOTE — Progress Notes (Signed)
   Subjective:    Patient ID: Andrew Huerta, male    DOB: February 17, 1966, 48 y.o.   MRN: [id number]  HPI  Here for follow up BP  # Hyperaldosteronism - had venous sampling completed by [facility], found left adrenal producing "6 times the amount" - Dr. Mordecai Rasmussen called him last week, planning on removing adrenal in 1 month - Laden asks that his potassium be drawn today because Dr. Cena Benton has concerns about low potassium for surgery. He says Dr. Cena Benton told him he may need to go back on spironolactone for the short term. - he continues to say that his BP is elevated when he checks it at pharmacies: 170-180s over 110s  # Headaches - continues to get daily headaches, some days worse than others - past week had 4 days of increased headache that he finally took a phenergan to put himself to sleep  Review of Systems Endorses headaches, chest pain (chronic), knee/joint pain.    Objective:   Physical Exam BP 167/124  Pulse 92  Temp(Src) 97.6 F (36.4 C) (Oral)  Ht 5\' 7"  (1.702 m)  Wt 160 lb (72.576 kg)  BMI 25.05 kg/m2  Repeat BP by manual cuff 168/118.  General: NAD, appears more pleasant today HEENT: PERRL, EOMI CV: RRR, normal heart sounds, no murmurs. 2+ PT pulses bilaterally Resp: CTAB, normal effort Ext: no edema or cyanosis.      Assessment & Plan:  See Problem List documentation

## 2013-09-26 NOTE — Patient Instructions (Signed)
It was good to see you again.  I will call you with the results of your potassium.

## 2013-09-29 ENCOUNTER — Telehealth: Payer: Self-pay | Admitting: Family Medicine

## 2013-09-29 DIAGNOSIS — E876 Hypokalemia: Secondary | ICD-10-CM

## 2013-09-29 MED ORDER — EPLERENONE 25 MG PO TABS: 25.0000 mg | ORAL_TABLET | Freq: Every day | ORAL | Status: DC

## 2013-09-29 NOTE — Telephone Encounter (Signed)
Spoke with Dr. Posey Pronto (Endocrinologist) regarding Mr. Vangilder slightly low potassium (3.4). He agreed that it would be reasonable with his high blood pressure and low potassium to start eplerenone 25mg  daily in order to help stabilize his potassium before surgery (tentatively next month). I called pt and informed him of the plan and he agreed. He will call the clinic and schedule an appointment for about 2 weeks from now so that we can re-check his BP and blood work. I placed future order for Bmet in case he can only come in and get the blood drawn and I will cal him with additional changes in the plan if needed. -[name], MD

## 2013-10-02 HISTORY — PX: ADRENALECTOMY: SHX876

## 2013-10-07 ENCOUNTER — Emergency Department (HOSPITAL_COMMUNITY): Payer: Medicaid Other

## 2013-10-07 ENCOUNTER — Emergency Department (HOSPITAL_COMMUNITY)
Admission: EM | Admit: 2013-10-07 | Discharge: 2013-10-07 | Disposition: A | Discharge status: Home / Self Care | Payer: Medicaid Other | Attending: Emergency Medicine | Admitting: Emergency Medicine

## 2013-10-07 ENCOUNTER — Encounter (HOSPITAL_COMMUNITY): Payer: Self-pay | Admitting: Emergency Medicine

## 2013-10-07 DIAGNOSIS — Z79899 Other long term (current) drug therapy: Secondary | ICD-10-CM | POA: Insufficient documentation

## 2013-10-07 DIAGNOSIS — R112 Nausea with vomiting, unspecified: Secondary | ICD-10-CM | POA: Insufficient documentation

## 2013-10-07 DIAGNOSIS — R1013 Epigastric pain: Secondary | ICD-10-CM | POA: Insufficient documentation

## 2013-10-07 DIAGNOSIS — Z87442 Personal history of urinary calculi: Secondary | ICD-10-CM | POA: Insufficient documentation

## 2013-10-07 DIAGNOSIS — N189 Chronic kidney disease, unspecified: Secondary | ICD-10-CM | POA: Insufficient documentation

## 2013-10-07 DIAGNOSIS — R1110 Vomiting, unspecified: Secondary | ICD-10-CM

## 2013-10-07 DIAGNOSIS — Z8739 Personal history of other diseases of the musculoskeletal system and connective tissue: Secondary | ICD-10-CM | POA: Insufficient documentation

## 2013-10-07 DIAGNOSIS — R0789 Other chest pain: Secondary | ICD-10-CM | POA: Insufficient documentation

## 2013-10-07 DIAGNOSIS — R630 Anorexia: Secondary | ICD-10-CM | POA: Insufficient documentation

## 2013-10-07 DIAGNOSIS — E876 Hypokalemia: Secondary | ICD-10-CM | POA: Insufficient documentation

## 2013-10-07 DIAGNOSIS — K219 Gastro-esophageal reflux disease without esophagitis: Secondary | ICD-10-CM | POA: Insufficient documentation

## 2013-10-07 DIAGNOSIS — K2270 Barrett's esophagus without dysplasia: Secondary | ICD-10-CM | POA: Insufficient documentation

## 2013-10-07 DIAGNOSIS — Z8501 Personal history of malignant neoplasm of esophagus: Secondary | ICD-10-CM | POA: Insufficient documentation

## 2013-10-07 DIAGNOSIS — F411 Generalized anxiety disorder: Secondary | ICD-10-CM | POA: Insufficient documentation

## 2013-10-07 DIAGNOSIS — I129 Hypertensive chronic kidney disease with stage 1 through stage 4 chronic kidney disease, or unspecified chronic kidney disease: Secondary | ICD-10-CM | POA: Insufficient documentation

## 2013-10-07 LAB — CBC
HEMATOCRIT: 43.8 % (ref 39.0–52.0)
HEMOGLOBIN: 15.9 g/dL (ref 13.0–17.0)
MCH: 31.8 pg (ref 26.0–34.0)
MCHC: 36.3 g/dL — ABNORMAL HIGH (ref 30.0–36.0)
MCV: 87.6 fL (ref 78.0–100.0)
Platelets: 195 10*3/uL (ref 150–400)
RBC: 5 MIL/uL (ref 4.22–5.81)
RDW: 13.4 % (ref 11.5–15.5)
WBC: 13.1 10*3/uL — AB (ref 4.0–10.5)

## 2013-10-07 LAB — BASIC METABOLIC PANEL
BUN: 14 mg/dL (ref 6–23)
CHLORIDE: 101 meq/L (ref 96–112)
CO2: 27 mEq/L (ref 19–32)
Calcium: 9.4 mg/dL (ref 8.4–10.5)
Creatinine, Ser: 0.78 mg/dL (ref 0.50–1.35)
GFR calc Af Amer: >90 mL/min (ref >90)
GFR calc non Af Amer: >90 mL/min (ref >90)
GLUCOSE: 100 mg/dL — AB (ref 70–99)
POTASSIUM: 3.2 meq/L — AB (ref 3.7–5.3)
Sodium: 143 mEq/L (ref 137–147)

## 2013-10-07 LAB — POCT I-STAT TROPONIN I: Troponin i, poc: 0 ng/mL (ref 0.00–0.08)

## 2013-10-07 LAB — OCCULT BLOOD, POC DEVICE: FECAL OCCULT BLD: NEGATIVE

## 2013-10-07 MED ORDER — HYDROCODONE-ACETAMINOPHEN 5-325 MG PO TABS
1.0000 | ORAL_TABLET | Freq: Once | ORAL | Status: DC
  Filled 2013-10-07: qty 2

## 2013-10-07 MED ORDER — ONDANSETRON HCL 4 MG PO TABS
4.0000 mg | ORAL_TABLET | Freq: Once | ORAL | Status: AC
  Administered 2013-10-07: 4 mg via ORAL
  Filled 2013-10-07: qty 1

## 2013-10-07 MED ORDER — HYDROMORPHONE HCL PF 1 MG/ML IJ SOLN
1.0000 mg | Freq: Once | INTRAMUSCULAR | Status: AC
  Administered 2013-10-07: 1 mg via INTRAMUSCULAR
  Filled 2013-10-07: qty 1

## 2013-10-07 NOTE — ED Notes (Signed)
Patient with heart burn and chest pain.  Patient states that he has had the heartburn all day.  He started vomiting blood at 1700 tonight.  Patient does have nausea at this time.

## 2013-10-07 NOTE — ED Provider Notes (Signed)
CSN: [id number]     Arrival date & time 10/07/13  2045 History   First MD Initiated Contact with Patient 10/07/13 2136     Chief Complaint  Patient presents with  . Hematemesis  . Chest Pain   (Consider location/radiation/quality/duration/timing/severity/associated sxs/prior Treatment) HPI Comments: 48 yo wm with pmh significant for Adrenal tumor, chronic CP, Depression, hiatal hernia, Barrett's esophagitis, hypokalemia, HTN, anxiety, chronic LBP presents with cc of epigastric pain and n/v with dark color.  He is concerned it is blood.    He denies recent infection, NSAID use, smoking, or blood thinners.    Pt has known adrenal tumor on L and scheduled for removal on March 19.      Patient is a 48 y.o. male presenting with chest pain. The history is provided by the patient and the spouse.  Chest Pain Pain location:  Substernal area Pain quality: aching   Pain quality: not burning, not crushing, not dull, not hot, no pressure, not radiating, not sharp, not shooting, not stabbing and not tearing   Pain radiates to:  Does not radiate Pain radiates to the back: no   Pain severity:  No pain Duration:  3 months Timing:  Intermittent Progression:  Worsening Chronicity:  Recurrent Associated symptoms: abdominal pain, nausea and vomiting   Associated symptoms: no cough, no palpitations and no shortness of breath     Past Medical History  Diagnosis Date  . Hypertension   . Hypokalemia   . Barrett's esophagus     egd - 11/27/09 - short segment of Barrett's  . Hyperlipidemia   . Degenerative joint disease     Bilateral knees. Significant knee pain since playing football in high school.   . Anxiety   . Low back pain   . Hypokalemia   . Sleep apnea   . Chronic kidney disease     stone  . Cancer     barrets diease of esophagus gets cancer test every 2 yrs  . GERD (gastroesophageal reflux disease)   . Headache(784.0)     when b/p get high  . Hiatal hernia     egd - 11/27/2009  .  Depression   . Chest pain     NL Echo 08/2006  . Adrenal tumor    Past Surgical History  Procedure Laterality Date  . Knee surgery    . Lithotripsy    . Kidney stone surgery     No family history on file. History  Substance Use Topics  . Smoking status: Never Smoker   . Smokeless tobacco: Never Used  . Alcohol Use: 0.0 oz/week     Comment: Occasional    Review of Systems  Constitutional: Positive for appetite change.  HENT: Negative.   Eyes: Negative.   Respiratory: Positive for chest tightness. Negative for apnea, cough, choking, shortness of breath and stridor.   Cardiovascular: Positive for chest pain. Negative for palpitations and leg swelling.  Gastrointestinal: Positive for nausea, vomiting and abdominal pain. Negative for diarrhea, constipation, blood in stool, abdominal distention, anal bleeding and rectal pain.  Endocrine: Negative.   Genitourinary: Negative.   Musculoskeletal: Negative.   Allergic/Immunologic: Negative.   Neurological: Negative.   Hematological: Negative.   Psychiatric/Behavioral: Negative.     Allergies  Cortisone acetate; Darvocet; and Nsaids  Home Medications   Current Outpatient Rx  Name  Route  Sig  Dispense  Refill  . benazepril (LOTENSIN) 40 MG tablet   Oral   Take 40 mg by mouth daily.         Marland Kitchen  clonazePAM (KLONOPIN) 0.5 MG tablet   Oral   Take 1 tablet (0.5 mg total) by mouth 2 (two) times daily as needed for anxiety.   20 tablet   0   . eplerenone (INSPRA) 25 MG tablet   Oral   Take 1 tablet (25 mg total) by mouth daily.   30 tablet   1   . esomeprazole (NEXIUM) 40 MG capsule   Oral   Take 1 capsule (40 mg total) by mouth daily before breakfast.   30 capsule   6   . gabapentin (NEURONTIN) 300 MG capsule   Oral   Take 300 mg by mouth daily.         Marland Kitchen HYDROcodone-acetaminophen (NORCO/VICODIN) 5-325 MG per tablet   Oral   Take 1 tablet by mouth every 8 (eight) hours as needed.   100 tablet   0   .  labetalol (NORMODYNE) 300 MG tablet   Oral   Take 300 mg by mouth 3 (three) times daily.          . potassium chloride (KLOR-CON M10) 10 MEQ tablet   Oral   Take 2 tablets (20 mEq total) by mouth daily.   60 tablet   0    BP 159/105  Pulse 88  Temp(Src) 98.5 F (36.9 C) (Oral)  Resp 13  Ht 5\' 7"  (1.702 m)  Wt 160 lb 12.8 oz (72.938 kg)  BMI 25.18 kg/m2  SpO2 94% Physical Exam  Nursing note and vitals reviewed. Constitutional: He is oriented to person, place, and time. He appears well-developed and well-nourished.  HENT:  Head: Atraumatic.  Nose: Nose normal.  Mouth/Throat: Oropharynx is clear and moist.  Eyes: Conjunctivae are normal. Pupils are equal, round, and reactive to light. Right eye exhibits no discharge. Left eye exhibits no discharge.  Neck: Normal range of motion. Neck supple.  Cardiovascular: Normal rate and regular rhythm.  Exam reveals no friction rub.   No murmur heard. Pulmonary/Chest: Effort normal. No respiratory distress. He has no wheezes. He has no rales. He exhibits no tenderness.  Abdominal: Soft. Bowel sounds are normal. He exhibits no distension. There is no tenderness. There is no rebound and no guarding.  Musculoskeletal: Normal range of motion. He exhibits no tenderness.  Neurological: He is oriented to person, place, and time.  Skin: Skin is warm and dry.    ED Course  Procedures (including critical care time) Labs Review Labs Reviewed  CBC - Abnormal; Notable for the following:    WBC 13.1 (*)    MCHC 36.3 (*)    All other components within normal limits  BASIC METABOLIC PANEL - Abnormal; Notable for the following:    Potassium 3.2 (*)    Glucose, Bld 100 (*)    All other components within normal limits  POCT I-STAT TROPONIN I  OCCULT BLOOD, POC DEVICE   Imaging Review Dg Chest 2 View  10/07/2013   CLINICAL DATA:  48 year old male vomiting chest pain. Vomiting blood. Hematemesis. Initial encounter.  EXAM: CHEST  2 VIEW   COMPARISON:  03/29/2013 and earlier.  FINDINGS: Stable and normal lung volumes. Normal cardiac size and mediastinal contours. Visualized tracheal air column is within normal limits. EKG button artifact. The lungs are clear. No pneumothorax or effusion. Visualized bowel gas pattern is non obstructed. No pneumoperitoneum identified. No acute osseous abnormality identified.  IMPRESSION: Negative, no acute cardiopulmonary abnormality.   Electronically Signed   By: Lars Pinks M.D.   On: 10/07/2013 21:45  EKG Interpretation    Date/Time:  Friday October 07 2013 20:51:14 EST Ventricular Rate:  76 PR Interval:  162 QRS Duration: 96 QT Interval:  392 QTC Calculation: 441 R Axis:   86 Text Interpretation:  Normal sinus rhythm Incomplete right bundle branch block Borderline ECG Confirmed by Loden Laurent  MD, Seung Nidiffer ([id number]) on 10/07/2013 9:37:10 PM            MDM   1. Abdominal pain, epigastric   2. Vomiting    48 year old white male presents emergency department with chief complaint of nausea vomiting, heartburn, dark emesis. His past history consistent with Barrett's esophagitis among other medical problems. Of note he has an adrenal tumor on the left for which she is scheduled for surgery in March 2015.  ER course: Vital signs normal. Benign physical exam with the exception of mild tenderness palpation in the epigastrium. Blood work unremarkable to include H&H of 15 and 43, he does have a mild elevation in white blood cell count 13. He has a chronic hypokalemia which is 3.2 today. Chest x-ray negative. EKG without signs of ischemia. Single set troponin negative. Occult blood negative. At this point I feel the patient is clear for discharge home and he and his wife agree. No evidence of emergent hemorrhage, vital signs are stable. H&H is normal. Guaiac negative.  Single set troponin is appropriate as his symptoms are epigastric in nature and these are chronic symptoms. I feel that acute coronary syndrome  is unlikely at this point. Patient has nausea medications as well as pain medications which she can take at home. Strict ER precautions were given for hematemesis, melena, worsening vomiting, worsening abdominal pain. Patient and his wife are very comfortable with this plan.    Elmer Sow, MD 10/08/13 [phone]

## 2013-10-07 NOTE — Discharge Instructions (Signed)
Abdominal Pain, Adult Many things can cause abdominal pain. Usually, abdominal pain is not caused by a disease and will improve without treatment. It can often be observed and treated at home. Your health care provider will do a physical exam and possibly order blood tests and X-rays to help determine the seriousness of your pain. However, in many cases, more time must pass before a clear cause of the pain can be found. Before that point, your health care provider may not know if you need more testing or further treatment. HOME CARE INSTRUCTIONS  Monitor your abdominal pain for any changes. The following actions may help to alleviate any discomfort you are experiencing:  Only take over-the-counter or prescription medicines as directed by your health care provider.  Do not take laxatives unless directed to do so by your health care provider.  Try a clear liquid diet (broth, tea, or water) as directed by your health care provider. Slowly move to a bland diet as tolerated. SEEK MEDICAL CARE IF:  You have unexplained abdominal pain.  You have abdominal pain associated with nausea or diarrhea.  You have pain when you urinate or have a bowel movement.  You experience abdominal pain that wakes you in the night.  You have abdominal pain that is worsened or improved by eating food.  You have abdominal pain that is worsened with eating fatty foods. SEEK IMMEDIATE MEDICAL CARE IF:   Your pain does not go away within 2 hours.  You have a fever.  You keep throwing up (vomiting).  Your pain is felt only in portions of the abdomen, such as the right side or the left lower portion of the abdomen.  You pass bloody or black tarry stools. MAKE SURE YOU:  Understand these instructions.   Will watch your condition.   Will get help right away if you are not doing well or get worse.  Document Released: 05/28/2005 Document Revised: 06/08/2013 Document Reviewed: 04/27/2013 [facility] Patient  Information 2014 [place].  Nausea and Vomiting Nausea is a sick feeling that often comes before throwing up (vomiting). Vomiting is a reflex where stomach contents come out of your mouth. Vomiting can cause severe loss of body fluids (dehydration). Children and elderly adults can become dehydrated quickly, especially if they also have diarrhea. Nausea and vomiting are symptoms of a condition or disease. It is important to find the cause of your symptoms. CAUSES   Direct irritation of the stomach lining. This irritation can result from increased acid production (gastroesophageal reflux disease), infection, food poisoning, taking certain medicines (such as nonsteroidal anti-inflammatory drugs), alcohol use, or tobacco use.  Signals from the brain.These signals could be caused by a headache, heat exposure, an inner ear disturbance, increased pressure in the brain from injury, infection, a tumor, or a concussion, pain, emotional stimulus, or metabolic problems.  An obstruction in the gastrointestinal tract (bowel obstruction).  Illnesses such as diabetes, hepatitis, gallbladder problems, appendicitis, kidney problems, cancer, sepsis, atypical symptoms of a heart attack, or eating disorders.  Medical treatments such as chemotherapy and radiation.  Receiving medicine that makes you sleep (general anesthetic) during surgery. DIAGNOSIS Your caregiver may ask for tests to be done if the problems do not improve after a few days. Tests may also be done if symptoms are severe or if the reason for the nausea and vomiting is not clear. Tests may include:  Urine tests.  Blood tests.  Stool tests.  Cultures (to look for evidence of infection).  X-rays or  other imaging studies. °Test results can help your caregiver make decisions about treatment or the need for additional tests. °TREATMENT °You need to stay well hydrated. Drink frequently but in small amounts. You may wish to drink water, sports  drinks, clear broth, or eat frozen ice pops or gelatin dessert to help stay hydrated. When you eat, eating slowly may help prevent nausea. There are also some antinausea medicines that may help prevent nausea. °HOME CARE INSTRUCTIONS  °· Take all medicine as directed by your caregiver. °· If you do not have an appetite, do not force yourself to eat. However, you must continue to drink fluids. °· If you have an appetite, eat a normal diet unless your caregiver tells you differently. °· Eat a variety of complex carbohydrates (rice, wheat, potatoes, bread), lean meats, yogurt, fruits, and vegetables. °· Avoid high-fat foods because they are more difficult to digest. °· Drink enough water and fluids to keep your urine clear or pale yellow. °· If you are dehydrated, ask your caregiver for specific rehydration instructions. Signs of dehydration may include: °· Severe thirst. °· Dry lips and mouth. °· Dizziness. °· Dark urine. °· Decreasing urine frequency and amount. °· Confusion. °· Rapid breathing or pulse. °SEEK IMMEDIATE MEDICAL CARE IF:  °· You have blood or brown flecks (like coffee grounds) in your vomit. °· You have black or bloody stools. °· You have a severe headache or stiff neck. °· You are confused. °· You have severe abdominal pain. °· You have chest pain or trouble breathing. °· You do not urinate at least once every 8 hours. °· You develop cold or clammy skin. °· You continue to vomit for longer than 24 to 48 hours. °· You have a fever. °MAKE SURE YOU:  °· Understand these instructions. °· Will watch your condition. °· Will get help right away if you are not doing well or get worse. °Document Released: 08/18/2005 Document Revised: 11/10/2011 Document Reviewed: 01/15/2011 °[employer]® Patient Information ©2014 [employer]. ° °

## 2013-10-10 ENCOUNTER — Telehealth: Payer: Self-pay | Admitting: *Deleted

## 2013-10-10 NOTE — Telephone Encounter (Signed)
Prior Authorization received from [facility] pharmacy for Nexium DR 40 mg. Formulary and PA form placed in provider box for completion. Derl Barrow, RN

## 2013-10-11 ENCOUNTER — Ambulatory Visit (INDEPENDENT_AMBULATORY_CARE_PROVIDER_SITE_OTHER): Payer: Medicaid Other | Admitting: Family Medicine

## 2013-10-11 ENCOUNTER — Encounter: Payer: Self-pay | Admitting: Family Medicine

## 2013-10-11 VITALS — BP 158/110 | HR 74 | Ht 67.0 in | Wt 160.9 lb

## 2013-10-11 DIAGNOSIS — E876 Hypokalemia: Secondary | ICD-10-CM

## 2013-10-11 DIAGNOSIS — K2270 Barrett's esophagus without dysplasia: Secondary | ICD-10-CM

## 2013-10-11 DIAGNOSIS — N200 Calculus of kidney: Secondary | ICD-10-CM

## 2013-10-11 DIAGNOSIS — E2609 Other primary hyperaldosteronism: Secondary | ICD-10-CM

## 2013-10-11 DIAGNOSIS — M549 Dorsalgia, unspecified: Secondary | ICD-10-CM

## 2013-10-11 DIAGNOSIS — E269 Hyperaldosteronism, unspecified: Secondary | ICD-10-CM

## 2013-10-11 LAB — BASIC METABOLIC PANEL
BUN: 15 mg/dL (ref 6–23)
CALCIUM: 9.7 mg/dL (ref 8.4–10.5)
CHLORIDE: 99 meq/L (ref 96–112)
CO2: 33 meq/L — AB (ref 19–32)
Creat: 0.86 mg/dL (ref 0.50–1.35)
Glucose, Bld: 81 mg/dL (ref 70–99)
Potassium: 3.3 mEq/L — ABNORMAL LOW (ref 3.5–5.3)
SODIUM: 142 meq/L (ref 135–145)

## 2013-10-11 LAB — POCT UA - MICROSCOPIC ONLY

## 2013-10-11 LAB — POCT URINALYSIS DIPSTICK
Bilirubin, UA: NEGATIVE
GLUCOSE UA: NEGATIVE
Ketones, UA: NEGATIVE
LEUKOCYTES UA: NEGATIVE
Nitrite, UA: NEGATIVE
PH UA: 7
Spec Grav, UA: 1.02
Urobilinogen, UA: 1

## 2013-10-11 MED ORDER — PROMETHAZINE HCL 25 MG PO TABS: 25.0000 mg | ORAL_TABLET | Freq: Three times a day (TID) | ORAL | Status: DC | PRN

## 2013-10-11 MED ORDER — PANTOPRAZOLE SODIUM 40 MG PO TBEC: 40.0000 mg | DELAYED_RELEASE_TABLET | Freq: Every day | ORAL | Status: DC

## 2013-10-11 NOTE — Progress Notes (Signed)
   Subjective:    Patient ID: Andrew Huerta, male    DOB: 17-Oct-1965, 48 y.o.   MRN: [id number]  HPI  CC: ED follow up, concern for kidney stone  # Kidney stone - has had multiple kidney stones in the past, last episode in November. Most of the 4-5 episodes he says he has had intervention by his urologist - pain feels similar to previous episodes, sharp pain in right side - pain currently rated as 8/10 - thinks he has had some decreased urine output but still producing, describes as somewhat dark. - denies any blood in urine - denies fevers/chills  # Primary Hyperaldosteronism - K+ low to 3.2 in ED - surgery scheduled for 3/19, pre-op exam 3/6  Review of Systems     Objective:   Physical Exam BP 158/110  Pulse 74  Ht 5\' 7"  (1.702 m)  Wt 160 lb 14.4 oz (72.984 kg)  BMI 25.19 kg/m2  General: NAD, moves around room easily, does not appear to be in obvious pain CV: RRR, normal heart sounds, no murmurs Resp: CTAB, normal effort Back: tenderness with minor palpation along spine. Endorses pain on left and right flank with percussion Abdomen: soft, mild tenderness to RLQ but no rebound or guarding. Nondistended. Ext: no edema or cyanosis      Assessment & Plan:  See Problem List documentation

## 2013-10-11 NOTE — Patient Instructions (Signed)
Given your history of kidney stones and the urine sample today, you probably do have kidney stones again.  You can increase your vicodin to 2 tablets each dose for the short term to help control the pain. If the pain gets severe enough, you notice blood in your urine, please call us or contact your urologist.

## 2013-10-12 DIAGNOSIS — N200 Calculus of kidney: Secondary | ICD-10-CM

## 2013-10-12 HISTORY — DX: Calculus of kidney: N20.0

## 2013-10-12 NOTE — Assessment & Plan Note (Signed)
A: Surgery on 3/19 P: Continue eplerenone and 77mEq potassium supplement. Repeat bmet today with K+ 3.3. Will forward results to surgeon.

## 2013-10-12 NOTE — Assessment & Plan Note (Signed)
A: history of multiple stones, UA with trace RBCs and calcium oxalate crystals. Currently is not demonstrating a lot of pain. P: Increase water intake, pain control with norco (can increase to 2 tabs per dose). Given recurrence and already has a urologist, will plan to have him follow up with urologist if stone does not pass (given urine filter).

## 2013-10-13 ENCOUNTER — Encounter: Payer: Self-pay | Admitting: *Deleted

## 2013-10-13 NOTE — Telephone Encounter (Signed)
Patient therapy switched to formulary equivalent protonix. Prescription sent to pharamcy. Mitzi Hansen

## 2013-10-19 ENCOUNTER — Telehealth: Payer: Self-pay | Admitting: Family Medicine

## 2013-10-19 DIAGNOSIS — IMO0002 Reserved for concepts with insufficient information to code with codable children: Secondary | ICD-10-CM

## 2013-10-19 DIAGNOSIS — M1710 Unilateral primary osteoarthritis, unspecified knee: Secondary | ICD-10-CM

## 2013-10-19 NOTE — Telephone Encounter (Signed)
Pt called and needs a refill on his pain medication. Please call when ready to pick up. jw

## 2013-10-20 ENCOUNTER — Other Ambulatory Visit: Payer: Self-pay | Admitting: Family Medicine

## 2013-10-20 DIAGNOSIS — M1710 Unilateral primary osteoarthritis, unspecified knee: Secondary | ICD-10-CM

## 2013-10-20 DIAGNOSIS — IMO0002 Reserved for concepts with insufficient information to code with codable children: Secondary | ICD-10-CM

## 2013-10-20 MED ORDER — HYDROCODONE-ACETAMINOPHEN 5-325 MG PO TABS: 1.0000 | ORAL_TABLET | Freq: Three times a day (TID) | ORAL | Status: DC | PRN

## 2013-10-20 NOTE — Telephone Encounter (Signed)
Pt called and wanted to know that status of his refill request. Blima Rich

## 2013-10-20 NOTE — Telephone Encounter (Signed)
Prescription refilled and left at front desk. -Mitzi Hansen

## 2013-10-20 NOTE — Telephone Encounter (Signed)
Spoke with patient and he states that he had a kidney stone and was told to double up on his medication.  This is why he is out early.  Jazmin Hartsell,CMA

## 2013-11-01 ENCOUNTER — Telehealth: Payer: Self-pay | Admitting: Family Medicine

## 2013-11-01 DIAGNOSIS — K2270 Barrett's esophagus without dysplasia: Secondary | ICD-10-CM

## 2013-11-01 NOTE — Telephone Encounter (Signed)
PA form and formulary placed in provider box for review. Derl Barrow, RN

## 2013-11-01 NOTE — Telephone Encounter (Signed)
Generic for nexium is not working. Was requried to generic by [facility]. Gastrologist is Dr Watt Climes Please advise

## 2013-11-01 NOTE — Telephone Encounter (Signed)
I had received a prior auth a month ago before switching to protonix, but I threw it out. Can we contact the pharmacy for it?

## 2013-11-01 NOTE — Telephone Encounter (Signed)
Have not received a PA for this pt/ medication.

## 2013-11-01 NOTE — Telephone Encounter (Signed)
Andrew Huerta,  Is there a prior authorization for this?

## 2013-11-02 MED ORDER — ESOMEPRAZOLE MAGNESIUM 40 MG PO CPDR: 40.0000 mg | DELAYED_RELEASE_CAPSULE | Freq: Every day | ORAL | Status: DC

## 2013-11-02 NOTE — Telephone Encounter (Signed)
Received PA approval for Nexium 40 mg DR cap via [employer].  Med approved for 11/02/2013 - 11/03/2014.   PA confirmation number [id number] W. Pharmacy can we called for Nexium Rx.  Derl Barrow, RN

## 2013-11-02 NOTE — Addendum Note (Signed)
Addended by: Leone Brand on: 11/02/2013 06:33 PM   Modules accepted: Orders, Medications

## 2013-11-07 ENCOUNTER — Telehealth: Payer: Self-pay | Admitting: Family Medicine

## 2013-11-07 DIAGNOSIS — M1710 Unilateral primary osteoarthritis, unspecified knee: Secondary | ICD-10-CM

## 2013-11-07 DIAGNOSIS — IMO0002 Reserved for concepts with insufficient information to code with codable children: Secondary | ICD-10-CM

## 2013-11-07 NOTE — Telephone Encounter (Signed)
Refill request for pain meds. Please for pick up

## 2013-11-10 MED ORDER — HYDROCODONE-ACETAMINOPHEN 5-325 MG PO TABS: 1.0000 | ORAL_TABLET | Freq: Three times a day (TID) | ORAL | Status: DC | PRN

## 2013-11-10 NOTE — Telephone Encounter (Signed)
Pt is aware of this. Andrew Huerta,CMA  

## 2013-11-15 ENCOUNTER — Telehealth: Payer: Self-pay | Admitting: *Deleted

## 2013-11-15 NOTE — Telephone Encounter (Signed)
Patient has an appt for adrenalectomy (outpatient 23 hr obs)--Needs NPI # to authorize visit.  NPI # given for 2 visits (procedure/follow-up visit).  Burna Forts, BSN, RN-BC

## 2013-11-16 NOTE — Telephone Encounter (Signed)
Is there anything else I need to do with this? Thanks.

## 2013-11-16 NOTE — Telephone Encounter (Signed)
Routed to you as FYI.  Nothing further needs to be done.  Burna Forts, BSN, RN-BC

## 2013-11-21 ENCOUNTER — Other Ambulatory Visit: Payer: Self-pay | Admitting: *Deleted

## 2013-11-21 MED ORDER — EPLERENONE 25 MG PO TABS: 25.0000 mg | ORAL_TABLET | Freq: Every day | ORAL | Status: DC

## 2013-12-02 ENCOUNTER — Emergency Department (HOSPITAL_COMMUNITY)
Admission: EM | Admit: 2013-12-02 | Discharge: 2013-12-02 | Disposition: A | Discharge status: Home / Self Care | Payer: Medicaid Other | Attending: Emergency Medicine | Admitting: Emergency Medicine

## 2013-12-02 ENCOUNTER — Encounter (HOSPITAL_COMMUNITY): Payer: Self-pay | Admitting: Emergency Medicine

## 2013-12-02 DIAGNOSIS — IMO0002 Reserved for concepts with insufficient information to code with codable children: Secondary | ICD-10-CM | POA: Insufficient documentation

## 2013-12-02 DIAGNOSIS — N189 Chronic kidney disease, unspecified: Secondary | ICD-10-CM | POA: Insufficient documentation

## 2013-12-02 DIAGNOSIS — H748X9 Other specified disorders of middle ear and mastoid, unspecified ear: Secondary | ICD-10-CM | POA: Insufficient documentation

## 2013-12-02 DIAGNOSIS — M1710 Unilateral primary osteoarthritis, unspecified knee: Secondary | ICD-10-CM | POA: Insufficient documentation

## 2013-12-02 DIAGNOSIS — Z79899 Other long term (current) drug therapy: Secondary | ICD-10-CM | POA: Insufficient documentation

## 2013-12-02 DIAGNOSIS — K219 Gastro-esophageal reflux disease without esophagitis: Secondary | ICD-10-CM | POA: Insufficient documentation

## 2013-12-02 DIAGNOSIS — E785 Hyperlipidemia, unspecified: Secondary | ICD-10-CM | POA: Insufficient documentation

## 2013-12-02 DIAGNOSIS — Z9089 Acquired absence of other organs: Secondary | ICD-10-CM | POA: Insufficient documentation

## 2013-12-02 DIAGNOSIS — Z87442 Personal history of urinary calculi: Secondary | ICD-10-CM | POA: Insufficient documentation

## 2013-12-02 DIAGNOSIS — I129 Hypertensive chronic kidney disease with stage 1 through stage 4 chronic kidney disease, or unspecified chronic kidney disease: Secondary | ICD-10-CM | POA: Insufficient documentation

## 2013-12-02 DIAGNOSIS — F411 Generalized anxiety disorder: Secondary | ICD-10-CM | POA: Insufficient documentation

## 2013-12-02 DIAGNOSIS — F3289 Other specified depressive episodes: Secondary | ICD-10-CM | POA: Insufficient documentation

## 2013-12-02 DIAGNOSIS — Z859 Personal history of malignant neoplasm, unspecified: Secondary | ICD-10-CM | POA: Insufficient documentation

## 2013-12-02 DIAGNOSIS — H8110 Benign paroxysmal vertigo, unspecified ear: Secondary | ICD-10-CM | POA: Insufficient documentation

## 2013-12-02 DIAGNOSIS — E876 Hypokalemia: Secondary | ICD-10-CM | POA: Insufficient documentation

## 2013-12-02 DIAGNOSIS — K2270 Barrett's esophagus without dysplasia: Secondary | ICD-10-CM | POA: Insufficient documentation

## 2013-12-02 DIAGNOSIS — F329 Major depressive disorder, single episode, unspecified: Secondary | ICD-10-CM | POA: Insufficient documentation

## 2013-12-02 LAB — CBC WITH DIFFERENTIAL/PLATELET
BASOS ABS: 0 10*3/uL (ref 0.0–0.1)
Basophils Relative: 0 % (ref 0–1)
EOS ABS: 0.1 10*3/uL (ref 0.0–0.7)
EOS PCT: 1 % (ref 0–5)
HCT: 42 % (ref 39.0–52.0)
Hemoglobin: 14.8 g/dL (ref 13.0–17.0)
Lymphocytes Relative: 21 % (ref 12–46)
Lymphs Abs: 2.7 10*3/uL (ref 0.7–4.0)
MCH: 31.4 pg (ref 26.0–34.0)
MCHC: 35.2 g/dL (ref 30.0–36.0)
MCV: 89.2 fL (ref 78.0–100.0)
Monocytes Absolute: 0.5 10*3/uL (ref 0.1–1.0)
Monocytes Relative: 4 % (ref 3–12)
NEUTROS ABS: 9.5 10*3/uL — AB (ref 1.7–7.7)
Neutrophils Relative %: 74 % (ref 43–77)
Platelets: 329 10*3/uL (ref 150–400)
RBC: 4.71 MIL/uL (ref 4.22–5.81)
RDW: 12.9 % (ref 11.5–15.5)
WBC: 12.9 10*3/uL — ABNORMAL HIGH (ref 4.0–10.5)

## 2013-12-02 LAB — COMPREHENSIVE METABOLIC PANEL
ALBUMIN: 3.9 g/dL (ref 3.5–5.2)
ALT: 26 U/L (ref 0–53)
AST: 18 U/L (ref 0–37)
Alkaline Phosphatase: 77 U/L (ref 39–117)
BUN: 20 mg/dL (ref 6–23)
CALCIUM: 9.3 mg/dL (ref 8.4–10.5)
CHLORIDE: 103 meq/L (ref 96–112)
CO2: 24 mEq/L (ref 19–32)
CREATININE: 0.95 mg/dL (ref 0.50–1.35)
GFR calc Af Amer: >90 mL/min (ref >90)
GFR calc non Af Amer: >90 mL/min (ref >90)
Glucose, Bld: 119 mg/dL — ABNORMAL HIGH (ref 70–99)
Potassium: 4.5 mEq/L (ref 3.7–5.3)
Sodium: 142 mEq/L (ref 137–147)
TOTAL PROTEIN: 7.5 g/dL (ref 6.0–8.3)
Total Bilirubin: 0.5 mg/dL (ref 0.3–1.2)

## 2013-12-02 MED ORDER — MECLIZINE HCL 25 MG PO TABS: 25.0000 mg | ORAL_TABLET | Freq: Three times a day (TID) | ORAL | Status: DC | PRN

## 2013-12-02 MED ORDER — MECLIZINE HCL 25 MG PO TABS
25.0000 mg | ORAL_TABLET | Freq: Once | ORAL | Status: AC
  Administered 2013-12-02: 25 mg via ORAL
  Filled 2013-12-02: qty 1

## 2013-12-02 NOTE — ED Notes (Signed)
He had  His adrenal  Gland removed march 19th.  For the past week he has had dizziness for one week and he gets nauseated also

## 2013-12-02 NOTE — ED Notes (Signed)
Orthostatics: pt. Not unsteady when standing. Pt. States, "feels better."

## 2013-12-02 NOTE — ED Provider Notes (Signed)
CSN: [id number]     Arrival date & time 12/02/13  1625 History   First MD Initiated Contact with Patient 12/02/13 1814     Chief Complaint  Patient presents with  . Dizziness     (Consider location/radiation/quality/duration/timing/severity/associated sxs/prior Treatment) Patient is a 48 y.o. male presenting with dizziness.  Dizziness Associated symptoms: nausea and vomiting   Associated symptoms: no chest pain, no diarrhea and no shortness of breath    48 yo male presents with dizziness that started 3-4 days ago. Patient is s/p left adrenal gland removal on 11/17/13 at [facility] (Dr. Ethelle Lyon). Patient released from hospital this past Monday, patients sxs started the next day. Patient states when he woke up he was seeing "stars" and felt like the room was spinning. Patient states he feels "foggy" constantly but dizziness seems to be worse with movement. Admits to N/V with dizziness. Denies CP, SOB, or HA. Patient has significant PMH as listed below.  Past Medical History  Diagnosis Date  . Hypertension   . Hypokalemia   . Barrett's esophagus     egd - 11/27/09 - short segment of Barrett's  . Hyperlipidemia   . Degenerative joint disease     Bilateral knees. Significant knee pain since playing football in high school.   . Anxiety   . Low back pain   . Hypokalemia   . Sleep apnea   . Chronic kidney disease     stone  . Cancer     barrets diease of esophagus gets cancer test every 2 yrs  . GERD (gastroesophageal reflux disease)   . Headache(784.0)     when b/p get high  . Hiatal hernia     egd - 11/27/2009  . Depression   . Chest pain     NL Echo 08/2006  . Adrenal tumor    Past Surgical History  Procedure Laterality Date  . Knee surgery    . Lithotripsy    . Kidney stone surgery     No family history on file. History  Substance Use Topics  . Smoking status: Never Smoker   . Smokeless tobacco: Never Used  . Alcohol Use: 0.0 oz/week     Comment: Occasional    Review of  Systems  Constitutional: Negative for fever and chills.  HENT: Negative for congestion and ear pain.   Respiratory: Positive for cough. Negative for shortness of breath and wheezing.   Cardiovascular: Negative for chest pain and leg swelling.  Gastrointestinal: Positive for nausea and vomiting. Negative for diarrhea and constipation.  Neurological: Positive for dizziness.  All other systems reviewed and are negative.      Allergies  Cortisone acetate; Darvocet; and Nsaids  Home Medications   Current Outpatient Rx  Name  Route  Sig  Dispense  Refill  . benazepril (LOTENSIN) 40 MG tablet   Oral   Take 40 mg by mouth daily.         . clonazePAM (KLONOPIN) 0.5 MG tablet   Oral   Take 1 tablet (0.5 mg total) by mouth 2 (two) times daily as needed for anxiety.   20 tablet   0   . eplerenone (INSPRA) 25 MG tablet   Oral   Take 1 tablet (25 mg total) by mouth daily.   30 tablet   1   . esomeprazole (NEXIUM) 40 MG capsule   Oral   Take 1 capsule (40 mg total) by mouth daily.   30 capsule   11  Prior Auth confirmation number [id number] W   . gabapentin (NEURONTIN) 300 MG capsule   Oral   Take 300 mg by mouth daily.         Marland Kitchen HYDROcodone-acetaminophen (NORCO/VICODIN) 5-325 MG per tablet   Oral   Take 1-2 tablets by mouth every 8 (eight) hours as needed (for kidney stone and arthritis).   100 tablet   0   . labetalol (NORMODYNE) 300 MG tablet   Oral   Take 300 mg by mouth 3 (three) times daily.          . potassium chloride (KLOR-CON M10) 10 MEQ tablet   Oral   Take 2 tablets (20 mEq total) by mouth daily.   60 tablet   0   . meclizine (ANTIVERT) 25 MG tablet   Oral   Take 1 tablet (25 mg total) by mouth 3 (three) times daily as needed for dizziness.   30 tablet   0    BP 148/90  Pulse 87  Temp(Src) 98.3 F (36.8 C)  Resp 16  Ht 5\' 7"  (1.702 m)  Wt 151 lb (68.493 kg)  BMI 23.64 kg/m2  SpO2 98% Physical Exam  Nursing note and vitals  reviewed. Constitutional: He is oriented to person, place, and time. He appears well-developed and well-nourished. No distress.  HENT:  Head: Normocephalic and atraumatic.  Right Ear: Tympanic membrane and ear canal normal. No mastoid tenderness. Tympanic membrane is not injected, not perforated, not erythematous, not retracted and not bulging. No middle ear effusion. No decreased hearing is noted.  Left Ear: Ear canal normal. No mastoid tenderness. Tympanic membrane is not injected, not perforated, not erythematous, not retracted and not bulging. A middle ear effusion is present. No decreased hearing is noted.  Nose: Nose normal. Right sinus exhibits no maxillary sinus tenderness and no frontal sinus tenderness. Left sinus exhibits no maxillary sinus tenderness and no frontal sinus tenderness.  Mouth/Throat: Uvula is midline, oropharynx is clear and moist and mucous membranes are normal. No oropharyngeal exudate, posterior oropharyngeal edema or posterior oropharyngeal erythema.  Eyes: Conjunctivae and EOM are normal. Pupils are equal, round, and reactive to light. Right eye exhibits no discharge. Left eye exhibits no discharge. No scleral icterus.  Neck: Trachea normal, normal range of motion and phonation normal. Neck supple. No JVD present. Carotid bruit is not present. No rigidity. No tracheal deviation, no edema and no erythema present.  Cardiovascular: Normal rate and regular rhythm.  Exam reveals no gallop and no friction rub.   No murmur heard. Pulmonary/Chest: Effort normal and breath sounds normal. No stridor. No respiratory distress. He has no wheezes. He has no rhonchi. He has no rales.  Abdominal: Soft. There is no tenderness.  Musculoskeletal: Normal range of motion. He exhibits no edema.  Lymphadenopathy:    He has no cervical adenopathy.  Neurological: He is alert and oriented to person, place, and time. He has normal strength. No cranial nerve deficit or sensory deficit.  CN  II-XII grossly intact. Cerebellar function appears intact with finger to nose. Patient able to stand and ambulate in room but feels dizzy when going from sitting to standing initially, resolves after standing for a moment.   Skin: Skin is warm and dry. No rash noted. He is not diaphoretic.  Psychiatric: He has a normal mood and affect. His behavior is normal.    ED Course  Procedures (including critical care time) Labs Review Labs Reviewed  CBC WITH DIFFERENTIAL - Abnormal; Notable for the following:  WBC 12.9 (*)    Neutro Abs 9.5 (*)    All other components within normal limits  COMPREHENSIVE METABOLIC PANEL - Abnormal; Notable for the following:    Glucose, Bld 119 (*)    All other components within normal limits   Imaging Review No results found.   EKG Interpretation   Date/Time:  Friday December 02 2013 16:51:00 EDT Ventricular Rate:  88 PR Interval:  154 QRS Duration: 88 QT Interval:  362 QTC Calculation: 438 R Axis:   84 Text Interpretation:  Normal sinus rhythm Normal ECG No significant change  since last tracing Confirmed by [facility]  MD, Juanda Crumble [phone]) on 12/02/2013  6:54:14 PM      MDM   Final diagnoses:  Vertigo, benign positional   Patient afebrile with stable VS.  EKG shows NSR with no significant changes from prior.  No electrolyte abnormalities.  Orthostatic Vitals appear to be WNL.  Patient sxs improved with Meclizine given in ED.  Discussed labs, and exam findings with patient. Advised follow up with PCP in 2 days for reevaluation.Return precautions given for worsening dizziness, Headaches, fever, chest pain, or shortness of breath. Patient agrees with plan. Discharged in good condition.   Meds given in ED:  Medications  meclizine (ANTIVERT) tablet 25 mg (25 mg Oral Given 12/02/13 1922)    Discharge Medication List as of 12/02/2013  8:23 PM    START taking these medications   Details  meclizine (ANTIVERT) 25 MG tablet Take 1 tablet (25 mg total) by  mouth 3 (three) times daily as needed for dizziness., Starting 12/02/2013, Until Discontinued, Print            Dossie Arbour [place], [place] 12/03/13 [phone]

## 2013-12-02 NOTE — Discharge Instructions (Signed)
Follow up with your doctor in 2 days for further evaluation of your symptoms. Take medications as directed for vertigo symptoms. Return to emergency department if you develop any worsening dizziness, Headaches, fever, chest pain, or shortness of breath   Vertigo Vertigo means you feel like you are moving when you are not. Vertigo can make you feel like things around you are moving when they are not. This problem often goes away on its own.  HOME CARE   Follow your doctor's instructions.  Avoid driving.  Avoid using heavy machinery.  Avoid doing any activity that could be dangerous if you have a vertigo attack.  Tell your doctor if a medicine seems to cause your vertigo. GET HELP RIGHT AWAY IF:   Your medicines do not help or make you feel worse.  You have trouble talking or walking.  You feel weak or have trouble using your arms, hands, or legs.  You have bad headaches.  You keep feeling sick to your stomach (nauseous) or throwing up (vomiting).  Your vision changes.  A family member notices changes in your behavior.  Your problems get worse. MAKE SURE YOU:  Understand these instructions.  Will watch your condition.  Will get help right away if you are not doing well or get worse. Document Released: 05/27/2008 Document Revised: 11/10/2011 Document Reviewed: 03/06/2011 [facility] Patient Information 2014 [place].

## 2013-12-03 NOTE — ED Provider Notes (Signed)
Medical screening examination/treatment/procedure(s) were performed by non-physician practitioner and as supervising physician I was immediately available for consultation/collaboration.   EKG Interpretation   Date/Time:  Friday December 02 2013 16:51:00 EDT Ventricular Rate:  88 PR Interval:  154 QRS Duration: 88 QT Interval:  362 QTC Calculation: 438 R Axis:   84 Text Interpretation:  Normal sinus rhythm Normal ECG No significant change  since last tracing Confirmed by [facility]  MD, CHARLES [phone]) on 12/02/2013  6:54:14 PM        Charles B. Karle Starch, MD 12/03/13 [phone]

## 2013-12-09 ENCOUNTER — Encounter: Payer: Self-pay | Admitting: Family Medicine

## 2013-12-09 ENCOUNTER — Ambulatory Visit (INDEPENDENT_AMBULATORY_CARE_PROVIDER_SITE_OTHER): Payer: Medicaid Other | Admitting: Family Medicine

## 2013-12-09 VITALS — BP 104/73 | HR 88 | Temp 98.5°F | Wt 153.0 lb

## 2013-12-09 DIAGNOSIS — IMO0002 Reserved for concepts with insufficient information to code with codable children: Secondary | ICD-10-CM

## 2013-12-09 DIAGNOSIS — E269 Hyperaldosteronism, unspecified: Secondary | ICD-10-CM

## 2013-12-09 DIAGNOSIS — M1710 Unilateral primary osteoarthritis, unspecified knee: Secondary | ICD-10-CM

## 2013-12-09 DIAGNOSIS — E2609 Other primary hyperaldosteronism: Secondary | ICD-10-CM

## 2013-12-09 DIAGNOSIS — H8110 Benign paroxysmal vertigo, unspecified ear: Secondary | ICD-10-CM

## 2013-12-09 MED ORDER — MECLIZINE HCL 25 MG PO TABS: 25.0000 mg | ORAL_TABLET | Freq: Three times a day (TID) | ORAL | Status: DC | PRN

## 2013-12-09 MED ORDER — HYDROCODONE-ACETAMINOPHEN 5-325 MG PO TABS: 1.0000 | ORAL_TABLET | Freq: Three times a day (TID) | ORAL | Status: DC | PRN

## 2013-12-09 MED ORDER — TRAMADOL HCL 50 MG PO TABS: 50.0000 mg | ORAL_TABLET | Freq: Three times a day (TID) | ORAL | Status: DC | PRN

## 2013-12-09 NOTE — Patient Instructions (Addendum)
For your vertigo: You can continue takign the meclizine as needed. If you still have symptoms, we will repeat the head maneuvers to try and move the "stone" that may be potentially in your ear causing the symptoms.  Pain medications: Alternate the hydrocodone and tramadol. Take them 8 hours apart. I would suggest replacing your morning or afternoon dose of hydrocodone with the tramadol to start. If the tramadol helps with your pain, we can then have you replace more doses of the hydrocodone

## 2013-12-11 DIAGNOSIS — H8110 Benign paroxysmal vertigo, unspecified ear: Secondary | ICD-10-CM | POA: Insufficient documentation

## 2013-12-11 NOTE — Assessment & Plan Note (Signed)
Refills for norco #90 (down from #100 he had been getting) and tramadol #30. Discussed trying to substitute tramadol initially for one dose a day, possibly increasing in the future. I believe he may be getting opioid-induced hyperalgesia related to his headaches, chest pain, and general body pains.

## 2013-12-11 NOTE — Assessment & Plan Note (Signed)
Performed Epley maneuver in office with some relief of symptoms. No noted nystagmus during examination, so this may not be BPPV. Denies hearing loss (meniere's). P: continue meclizine for symptomatic treatment. If symptoms persist will attempt Epley maneuvers again (though again, no nystagmus to isolate so may need to repeat both sides; in office today he only had right side performed)

## 2013-12-11 NOTE — Progress Notes (Signed)
Patient ID: Andrew Huerta, male   DOB: 02/18/1966, 48 y.o.   MRN: [id number]   Subjective:    Patient ID: Andrew Huerta, male    DOB: 10/10/1965, 48 y.o.   MRN: [id number]  HPI  CC: ED f/u for dizziness  # Dizziness:  Describes as room spinning sensation  First started happening 1 week ago, went to ED where he was given meclizine with some relief. Has been occurring intermittently since that time  Has had some vision changes with it (stars/black dots)  Worsens with position changes: going from laying down to sitting/standing up but cannot say how long it lasts ROS: has headaches similar to surgery,   # Chest pain  Still having chest pains similar to before the surgery  Non-exertional, does not currently have any pain ROS: no cough, no SOB  # Primary hyperaldo  Had surgery 3/19 for left adrenalectomy  Currently only on labetalol for BP  Review of Systems   See HPI for ROS. Objective:  BP 104/73  Pulse 88  Temp(Src) 98.5 F (36.9 C) (Oral)  Wt 153 lb (69.4 kg)  General: NAD HEENT: PERRL, EOMI.  Cardiac: RRR, normal heart sounds, no murmurs. 2+ radial and PT pulses bilaterally Respiratory: CTAB, normal effort Extremities: no edema or cyanosis. WWP. Skin: warm and dry, no rashes noted Neuro: alert and oriented, no focal deficits. CN grossly normal. No nystagmus noted on position changes. Dix-Hallpike positive for symptoms but not nystagmus     Assessment & Plan:  See Problem List Documentation

## 2013-12-11 NOTE — Assessment & Plan Note (Signed)
Doing well post-op, scars healing well. He is only taking labetalol once a day, he is concerned this is making his BP too low. P: check BP while at home. At f/u may just consider discontinuing this completely.

## 2014-01-12 ENCOUNTER — Ambulatory Visit (INDEPENDENT_AMBULATORY_CARE_PROVIDER_SITE_OTHER): Payer: Medicaid Other | Admitting: Family Medicine

## 2014-01-12 ENCOUNTER — Encounter: Payer: Self-pay | Admitting: Family Medicine

## 2014-01-12 VITALS — BP 126/88 | HR 87 | Temp 97.2°F | Ht 67.0 in | Wt 155.2 lb

## 2014-01-12 DIAGNOSIS — L24A9 Irritant contact dermatitis due friction or contact with other specified body fluids: Secondary | ICD-10-CM | POA: Insufficient documentation

## 2014-01-12 DIAGNOSIS — M1710 Unilateral primary osteoarthritis, unspecified knee: Secondary | ICD-10-CM

## 2014-01-12 DIAGNOSIS — T148XXA Other injury of unspecified body region, initial encounter: Secondary | ICD-10-CM | POA: Insufficient documentation

## 2014-01-12 DIAGNOSIS — IMO0002 Reserved for concepts with insufficient information to code with codable children: Secondary | ICD-10-CM

## 2014-01-12 MED ORDER — TRAMADOL HCL 50 MG PO TABS: 50.0000 mg | ORAL_TABLET | Freq: Three times a day (TID) | ORAL | Status: DC | PRN

## 2014-01-12 MED ORDER — HYDROCODONE-ACETAMINOPHEN 5-325 MG PO TABS: 1.0000 | ORAL_TABLET | Freq: Three times a day (TID) | ORAL | Status: DC | PRN

## 2014-01-12 NOTE — Assessment & Plan Note (Signed)
Drainage from adrenalectomy incision on his back. Currently appears well healed, no erythema, fluctuance, or other signs of infection. Suggested to use wider bandages, avoid excessive cleaning (as skin appeared dry), benadryl cream if itching persists. Discussed signs/symptoms of infection and return precautions.

## 2014-01-12 NOTE — Assessment & Plan Note (Signed)
Still trying to wean his use of norco using tramadol. Patient agrees to continue to try the tramadol in place of some doses of norco. P: refill #90 norco, #30 tramadol, refills x 1 for each dated not to refill before 02/13/14.

## 2014-01-12 NOTE — Patient Instructions (Signed)
For your surgery incision:  Try using bigger bandaids that will cover the area more widely. It appears to be fairly well healed in the office today, just a red dot that looks like a healed scab. The skin around the area does look dry. I would decrease the amount of soap you are using to wash the area, maybe once every 2-3 days.  If the itching continues, you can use an over the counter benadryl cream to help.

## 2014-01-12 NOTE — Progress Notes (Signed)
Patient ID: Andrew Huerta, male   DOB: [birth year], 48 y.o.   MRN: [id number]   Subjective:    Patient ID: Andrew Huerta, male    DOB: April 11, 1966, 48 y.o.   MRN: [id number]  HPI  CC: follow up, draining from incision  # Drainage from incision:  Periodically has clear/bloody drainage from surgical incision on back (surgery was in mid-March). Counts total of 3 times since last office visit.  Has 1-2 times noted greenish discharge  Area is itchy, painful at times. He does scratch it and removes some scabs  Cleans area with soap at least once a day  Covers with bandaids but they tend to fall off ROS: no fevers/chills, no nausea/vomiting. CP is improved (only intermittently), headaches also improved  # Chronic osteoarthritis of knees   At last visit given tramadol to substitute for some doses of norco.  50% of the time tramadol gives him stomach pains, upset stomach  Review of Systems   See HPI for ROS. Objective:  BP 126/88  Pulse 87  Temp(Src) 97.2 F (36.2 C) (Oral)  Ht 5\' 7"  (1.702 m)  Wt 155 lb 3.2 oz (70.398 kg)  BMI 24.30 kg/m2  General: NAD Cardiac: RRR, normal heart sounds, no murmurs. 2+ radial and PT pulses bilaterally Respiratory: CTAB, normal effort Back: 3 well healed surgical scars posteriorly. Most medial and largest scar has a small scab, no fluctuance or induration, no erythema, not painful to manipulation Extremities: no edema or cyanosis. WWP. Skin: warm and dry, no rashes noted    Assessment & Plan:  See Problem List Documentation

## 2014-02-24 ENCOUNTER — Encounter: Payer: Self-pay | Admitting: Family Medicine

## 2014-02-24 ENCOUNTER — Ambulatory Visit (INDEPENDENT_AMBULATORY_CARE_PROVIDER_SITE_OTHER): Payer: Medicaid Other | Admitting: Family Medicine

## 2014-02-24 VITALS — BP 111/74 | HR 87 | Temp 97.6°F | Wt 154.0 lb

## 2014-02-24 DIAGNOSIS — M549 Dorsalgia, unspecified: Secondary | ICD-10-CM

## 2014-02-24 DIAGNOSIS — N200 Calculus of kidney: Secondary | ICD-10-CM

## 2014-02-24 DIAGNOSIS — IMO0002 Reserved for concepts with insufficient information to code with codable children: Secondary | ICD-10-CM

## 2014-02-24 DIAGNOSIS — M1710 Unilateral primary osteoarthritis, unspecified knee: Secondary | ICD-10-CM

## 2014-02-24 LAB — POCT UA - MICROSCOPIC ONLY

## 2014-02-24 LAB — POCT URINALYSIS DIPSTICK
GLUCOSE UA: NEGATIVE
KETONES UA: NEGATIVE
LEUKOCYTES UA: NEGATIVE
Nitrite, UA: NEGATIVE
Protein, UA: 30
Spec Grav, UA: 1.025
Urobilinogen, UA: 0.2
pH, UA: 6

## 2014-02-24 MED ORDER — HYDROCODONE-ACETAMINOPHEN 5-325 MG PO TABS: 1.0000 | ORAL_TABLET | Freq: Three times a day (TID) | ORAL | Status: DC | PRN

## 2014-02-24 MED ORDER — TRAMADOL HCL 50 MG PO TABS: 50.0000 mg | ORAL_TABLET | Freq: Three times a day (TID) | ORAL | Status: DC | PRN

## 2014-02-24 MED ORDER — HYDROCODONE-ACETAMINOPHEN 5-325 MG PO TABS
1.0000 | ORAL_TABLET | Freq: Three times a day (TID) | ORAL | Status: DC | PRN
Start: 2014-04-15 — End: 2014-04-24

## 2014-02-24 MED ORDER — TAMSULOSIN HCL 0.4 MG PO CAPS: 0.4000 mg | ORAL_CAPSULE | Freq: Every day | ORAL | Status: DC

## 2014-02-24 NOTE — Assessment & Plan Note (Signed)
Possible stone based on UA (small blood), symptoms, history of prior stones. Flomax and copious hydration, continue his normal percocet for , will also place referral for urology given recurrent symptoms and blood in UA.

## 2014-02-24 NOTE — Assessment & Plan Note (Signed)
No changes in symptoms. Refill for percocet #90 and tramadol #30 given to be filled 03/15/14 and 04/15/14. Need to try wean down of percocet at that time.

## 2014-02-24 NOTE — Progress Notes (Signed)
Patient ID: Andrew Huerta, male   DOB: [birth year], 48 y.o.   MRN: [id number]   Subjective:    Patient ID: Andrew Huerta, male    DOB: 18-Jun-1966, 48 y.o.   MRN: [id number]  HPI  CC: kidney stone  # Kidney stone:  Thinks the possible kidney stone seen 4 months ago has not gone away.  Has had worsening pain on right lower abdomen, right side of his back  Noted some blood in urine 2 weeks ago, has not been consistent but at that time was a "good amount"  Has had some difficulty with urination, voiding about 2 times a day and gets a "small amount" out. Describes difficult starting only occasionally, stream is good, but pees for short time.   Says he has been followed by Dr. Karsten Ro for previous kidney stones, also reports being tested for PSA that was "normal". ROS: no burning urination, no incontinence, no fevers/chills  # Bilateral osteo arthritis of knees  No changes recently. Still taking tramadol in addition to percocet. Change some doses of percocet for tramadol, but tramadol causes upset stomach. He wants to continue trying to use the tramadol.  Review of Systems   See HPI for ROS. Objective:  BP 111/74  Pulse 87  Temp(Src) 97.6 F (36.4 C) (Oral)  Wt 154 lb (69.854 kg)  General: NAD Cardiac: RRR, normal heart sounds, no murmurs. 2+ radial and PT pulses bilaterally Respiratory: CTAB, normal effort Abdomen: soft, tender to palpation LLQ and RLQ, nondistended, no hepatic or splenomegaly. Bowel sounds present Back: tender to palpation lumbar spine, right CVA. Extremities: no edema or cyanosis. WWP. Neuro: alert and oriented, no focal deficits     Assessment & Plan:  See Problem List Documentation

## 2014-02-24 NOTE — Patient Instructions (Signed)
Your urine sample did not look to be infected, it did have a small amount of blood in it.   Try taking the Flomax (tamsulosin) 0.4mg  once a day. This will help you with your urination. Continue to drink plenty of water (64oz minimum) to try and help pass any potential stones you are having.  If you are still having problems by the middle of next week, I suggest calling your Urologist for an appointment to be evaluated.  Dr. Lamar Benes

## 2014-03-18 ENCOUNTER — Emergency Department (HOSPITAL_COMMUNITY): Payer: Medicaid Other

## 2014-03-18 ENCOUNTER — Encounter (HOSPITAL_COMMUNITY): Payer: Self-pay | Admitting: Emergency Medicine

## 2014-03-18 ENCOUNTER — Emergency Department (HOSPITAL_COMMUNITY)
Admission: EM | Admit: 2014-03-18 | Discharge: 2014-03-18 | Disposition: A | Discharge status: Home / Self Care | Payer: Medicaid Other | Attending: Emergency Medicine | Admitting: Emergency Medicine

## 2014-03-18 DIAGNOSIS — I129 Hypertensive chronic kidney disease with stage 1 through stage 4 chronic kidney disease, or unspecified chronic kidney disease: Secondary | ICD-10-CM | POA: Insufficient documentation

## 2014-03-18 DIAGNOSIS — F3289 Other specified depressive episodes: Secondary | ICD-10-CM | POA: Diagnosis not present

## 2014-03-18 DIAGNOSIS — F411 Generalized anxiety disorder: Secondary | ICD-10-CM | POA: Diagnosis not present

## 2014-03-18 DIAGNOSIS — Z862 Personal history of diseases of the blood and blood-forming organs and certain disorders involving the immune mechanism: Secondary | ICD-10-CM | POA: Insufficient documentation

## 2014-03-18 DIAGNOSIS — Z8639 Personal history of other endocrine, nutritional and metabolic disease: Secondary | ICD-10-CM | POA: Insufficient documentation

## 2014-03-18 DIAGNOSIS — M1710 Unilateral primary osteoarthritis, unspecified knee: Secondary | ICD-10-CM | POA: Insufficient documentation

## 2014-03-18 DIAGNOSIS — R079 Chest pain, unspecified: Secondary | ICD-10-CM | POA: Insufficient documentation

## 2014-03-18 DIAGNOSIS — F329 Major depressive disorder, single episode, unspecified: Secondary | ICD-10-CM | POA: Diagnosis not present

## 2014-03-18 DIAGNOSIS — IMO0002 Reserved for concepts with insufficient information to code with codable children: Secondary | ICD-10-CM | POA: Insufficient documentation

## 2014-03-18 DIAGNOSIS — N189 Chronic kidney disease, unspecified: Secondary | ICD-10-CM | POA: Diagnosis not present

## 2014-03-18 DIAGNOSIS — Z8501 Personal history of malignant neoplasm of esophagus: Secondary | ICD-10-CM | POA: Insufficient documentation

## 2014-03-18 DIAGNOSIS — K219 Gastro-esophageal reflux disease without esophagitis: Secondary | ICD-10-CM | POA: Diagnosis not present

## 2014-03-18 DIAGNOSIS — Z87442 Personal history of urinary calculi: Secondary | ICD-10-CM | POA: Insufficient documentation

## 2014-03-18 DIAGNOSIS — R42 Dizziness and giddiness: Secondary | ICD-10-CM | POA: Diagnosis not present

## 2014-03-18 DIAGNOSIS — R110 Nausea: Secondary | ICD-10-CM | POA: Insufficient documentation

## 2014-03-18 DIAGNOSIS — Z79899 Other long term (current) drug therapy: Secondary | ICD-10-CM | POA: Insufficient documentation

## 2014-03-18 LAB — CBC
HCT: 42 % (ref 39.0–52.0)
Hemoglobin: 14.5 g/dL (ref 13.0–17.0)
MCH: 30.7 pg (ref 26.0–34.0)
MCHC: 34.5 g/dL (ref 30.0–36.0)
MCV: 89 fL (ref 78.0–100.0)
PLATELETS: 213 10*3/uL (ref 150–400)
RBC: 4.72 MIL/uL (ref 4.22–5.81)
RDW: 13.4 % (ref 11.5–15.5)
WBC: 9.5 10*3/uL (ref 4.0–10.5)

## 2014-03-18 LAB — BASIC METABOLIC PANEL
ANION GAP: 12 (ref 5–15)
BUN: 17 mg/dL (ref 6–23)
CO2: 25 mEq/L (ref 19–32)
CREATININE: 0.97 mg/dL (ref 0.50–1.35)
Calcium: 9.5 mg/dL (ref 8.4–10.5)
Chloride: 101 mEq/L (ref 96–112)
GFR calc Af Amer: >90 mL/min (ref >90)
GFR calc non Af Amer: >90 mL/min (ref >90)
Glucose, Bld: 94 mg/dL (ref 70–99)
POTASSIUM: 4.4 meq/L (ref 3.7–5.3)
Sodium: 138 mEq/L (ref 137–147)

## 2014-03-18 LAB — I-STAT TROPONIN, ED: Troponin i, poc: 0 ng/mL (ref 0.00–0.08)

## 2014-03-18 LAB — TROPONIN I
Troponin I: <0.3 ng/mL (ref <0.30)
Troponin I: <0.3 ng/mL (ref <0.30)

## 2014-03-18 LAB — D-DIMER, QUANTITATIVE (NOT AT ARMC)

## 2014-03-18 MED ORDER — GI COCKTAIL ~~LOC~~
30.0000 mL | Freq: Once | ORAL | Status: AC
  Administered 2014-03-18: 30 mL via ORAL
  Filled 2014-03-18: qty 30

## 2014-03-18 MED ORDER — ASPIRIN 81 MG PO CHEW
324.0000 mg | CHEWABLE_TABLET | Freq: Once | ORAL | Status: AC
  Administered 2014-03-18: 324 mg via ORAL
  Filled 2014-03-18: qty 4

## 2014-03-18 MED ORDER — MORPHINE SULFATE 4 MG/ML IJ SOLN
4.0000 mg | INTRAMUSCULAR | Status: DC | PRN
  Administered 2014-03-18 (×2): 4 mg via INTRAVENOUS
  Filled 2014-03-18 (×2): qty 1

## 2014-03-18 MED ORDER — NITROGLYCERIN 0.4 MG SL SUBL
0.4000 mg | SUBLINGUAL_TABLET | SUBLINGUAL | Status: DC | PRN
  Filled 2014-03-18: qty 1

## 2014-03-18 NOTE — ED Notes (Signed)
Pt took one nitro at home will decrease of pain from 10 to 7/10. However will hold nitro here at this time due to BP 100/76. See triage note. Pt walking in store, developed 10/10 pain with diaphoresis, nausea, dizziness. Vomited upon returning home. Has hx of adrenal gland removal in February. Prior to this, this was causing him to have "heart attack like symptoms" often. Since, these have resolved until today.

## 2014-03-18 NOTE — ED Provider Notes (Signed)
CSN: [id number]     Arrival date & time 03/18/14  1320 History   First MD Initiated Contact with Patient 03/18/14 1345     Chief Complaint  Patient presents with  . Chest Pain     (Consider location/radiation/quality/duration/timing/severity/associated sxs/prior Treatment) HPI Comments: 48 year old male with history of high blood pressure which is resolved since his adrenal surgery in February, hyper L. Doster and is, PTSD, depression presents with left-sided chest pain started at 11:30 well walking at [employer]. Patient did not feel he was exerting himself however he got nauseous and diaphoretic with lightheadedness. The pain initially was sharp and severe with mild radiation to left arm and left neck. His had milder similar symptoms prior to his adrenal workup does have improved since then. Patient no longer takes medicines for blood pressure. He is nonsmoker no cocaine use.  Patient does not get exertional symptoms recently. Patient denies recent surgery in the past 2 months, amoxicillin for shortness of breath, blood clot history, active cancer. Patient does have mild left thigh and calf ache the past 24 hours. No injury.  Patient is a 48 y.o. male presenting with chest pain. The history is provided by the patient.  Chest Pain Associated symptoms: nausea   Associated symptoms: no abdominal pain, no back pain, no fever, no headache, no shortness of breath and not vomiting     Past Medical History  Diagnosis Date  . Hypertension   . Hypokalemia   . Barrett's esophagus     egd - 11/27/09 - short segment of Barrett's  . Hyperlipidemia   . Degenerative joint disease     Bilateral knees. Significant knee pain since playing football in high school.   . Anxiety   . Low back pain   . Hypokalemia   . Sleep apnea   . Chronic kidney disease     stone  . Cancer     barrets diease of esophagus gets cancer test every 2 yrs  . GERD (gastroesophageal reflux disease)   . Headache(784.0)     when  b/p get high  . Hiatal hernia     egd - 11/27/2009  . Depression   . Chest pain     NL Echo 08/2006  . Adrenal tumor    Past Surgical History  Procedure Laterality Date  . Knee surgery    . Lithotripsy    . Kidney stone surgery     No family history on file. History  Substance Use Topics  . Smoking status: Never Smoker   . Smokeless tobacco: Never Used  . Alcohol Use: 0.0 oz/week     Comment: Occasional    Review of Systems  Constitutional: Negative for fever and chills.  HENT: Negative for congestion.   Eyes: Negative for visual disturbance.  Respiratory: Negative for shortness of breath.   Cardiovascular: Positive for chest pain. Negative for leg swelling.  Gastrointestinal: Positive for nausea. Negative for vomiting and abdominal pain.  Genitourinary: Negative for dysuria and flank pain.  Musculoskeletal: Negative for back pain, neck pain and neck stiffness.  Skin: Negative for rash.  Neurological: Positive for light-headedness. Negative for headaches.      Allergies  Cortisone acetate; Darvocet; and Nsaids  Home Medications   Prior to Admission medications   Medication Sig Start Date End Date Taking? Authorizing Provider  esomeprazole (NEXIUM) 40 MG capsule Take 1 capsule (40 mg total) by mouth daily. 11/02/13   [name], MD  gabapentin (NEURONTIN) 300 MG capsule Take 300 mg by  mouth daily.    Historical Provider, MD  HYDROcodone-acetaminophen (NORCO/VICODIN) 5-325 MG per tablet Take 1-2 tablets by mouth every 8 (eight) hours as needed for moderate pain. Do not fill until 03/15/2014 03/15/14   [name], MD  HYDROcodone-acetaminophen (NORCO/VICODIN) 5-325 MG per tablet Take 1-2 tablets by mouth every 8 (eight) hours as needed for moderate pain. Do not fill until 04/15/2014 04/15/14   [name], MD  meclizine (ANTIVERT) 25 MG tablet Take 1 tablet (25 mg total) by mouth 3 (three) times daily as needed for dizziness. 12/09/13   [name], MD  tamsulosin  (FLOMAX) 0.4 MG CAPS capsule Take 1 capsule (0.4 mg total) by mouth daily. 02/24/14   [name], MD  traMADol (ULTRAM) 50 MG tablet Take 1 tablet (50 mg total) by mouth every 8 (eight) hours as needed for moderate pain (alternate with the hydrocodone). Do not fill until 04/15/2014 04/15/14   [name], MD   BP 90/61  Pulse 94  Temp(Src) 98 F (36.7 C) (Oral)  Resp 20  SpO2 98% Physical Exam  Nursing note and vitals reviewed. Constitutional: He is oriented to person, place, and time. He appears well-developed and well-nourished.  HENT:  Head: Normocephalic and atraumatic.  Eyes: Conjunctivae are normal. Right eye exhibits no discharge. Left eye exhibits no discharge.  Neck: Normal range of motion. Neck supple. No tracheal deviation present.  Cardiovascular: Normal rate, regular rhythm and intact distal pulses.   Pulmonary/Chest: Effort normal and breath sounds normal.  Abdominal: Soft. He exhibits no distension. There is no tenderness. There is no guarding.  Musculoskeletal: He exhibits no edema and no tenderness.  Neurological: He is alert and oriented to person, place, and time.  Skin: Skin is warm. No rash noted.  Psychiatric: He has a normal mood and affect.    ED Course  Procedures (including critical care time) Labs Review Labs Reviewed  CBC  BASIC METABOLIC PANEL  D-DIMER, QUANTITATIVE  TROPONIN I  TROPONIN I  Randolm Idol, ED    Imaging Review Dg Chest 2 View  03/18/2014   CLINICAL DATA:  Chest pain.  EXAM: CHEST  2 VIEW  COMPARISON:  October 07, 2013.  FINDINGS: The heart size and mediastinal contours are within normal limits. Both lungs are clear. No pneumothorax or pleural effusion is noted. The visualized skeletal structures are unremarkable.  IMPRESSION: No acute cardiopulmonary abnormality seen.   Electronically Signed   By: Sabino Dick M.D.   On: 03/18/2014 14:33     EKG Interpretation   Date/Time:  Saturday March 18 2014 13:26:40 EDT Ventricular  Rate:  93 PR Interval:  148 QRS Duration: 92 QT Interval:  347 QTC Calculation: 432 R Axis:   85 Text Interpretation:  Sinus rhythm Confirmed by Geraldin Habermehl  MD, Tarah Buboltz ([id number])  on 03/18/2014 2:04:08 PM      MDM   Final diagnoses:  None   Well-appearing patient with only one cardiac risk factor and minimal concern for blood clots presents with left-sided chest pain and nausea since 11:30 today. Patient is low risk cardiac and plan for EKG, serial troponins and chest x-ray. Patient does have nonspecific left leg ache which is new, patient will rest for blood clots otherwise and d-dimer ordered. Patient mild relief with nitroglycerin prior to arrival and morphine ordered for mild to moderate pain. Plan for repeat EKG and repeat troponin. Pt low risk PE, ddimer ordered.  Patient heart score is 3 if troponins negative. Very low risk of major cardiac event.  Patient chest pain improved in ER. Second troponin pending. Patient signed out to ED provider to reassess and followup second troponin to decide on observation the hospital versus close outpatient followup for outpatient stress test.  Filed Vitals:   03/18/14 1327  BP: 90/61  Pulse: 94  Temp: 98 F (36.7 C)  TempSrc: Oral  Resp: 20  SpO2: 98%       Mariea Clonts, MD 03/18/14 1542

## 2014-03-18 NOTE — ED Notes (Signed)
Patient was in [place] today when he developed chest pain that he rates as a 10.  He became nauseated, had dizziness, and diaphoresis.  He also has vomited 10x since then.  Currently he rates his pain as an 8.  He took a Nitroglycerin that he had at home around 1230 and the pain did improve some.  Patient had adrenal gland removed in February and prior to that had chest pain frequently and high blood pressure.

## 2014-03-18 NOTE — Discharge Instructions (Signed)

## 2014-03-18 NOTE — ED Provider Notes (Signed)
ECG interpretation  1701pm   Date: 03/18/2014  Rate: 74  Rhythm: normal sinus rhythm  QRS Axis: normal  Intervals: normal  ST/T Wave abnormalities: normal  Conduction Disutrbances: none  Narrative Interpretation:   Old EKG Reviewed: No significant changes noted  Atypical chest pain.  Patient with a long-standing history of recurrent chest pain for many years.  Seem to be more severe today.  Question esophageal spasm.  Outpatient nuclear stress test in 2012 which was normal.  Outpatient cardiology followup.  EKG x2 normal.  Troponin x2 normal.  Discharge home in good condition     Hoy Morn, MD 03/18/14 1819

## 2014-03-18 NOTE — ED Notes (Signed)
MD at bedside. 

## 2014-03-20 ENCOUNTER — Telehealth: Payer: Self-pay | Admitting: Family Medicine

## 2014-03-20 NOTE — Telephone Encounter (Signed)
Pt called because he was seen in the ED over the weekend and they would like him to be referred to cardio/vascular. Can we do the referral or does he need to come in. Please call him and let him know. jw

## 2014-03-22 NOTE — Telephone Encounter (Signed)
Pt called back to check on the referral and also to see why Dr. Lamar Benes has not called him to check on his status since visiting the ED. Blima Rich

## 2014-03-22 NOTE — Telephone Encounter (Signed)
Spoke with patient today regarding cardiology referral. This problem has been worked up extensively in the past with stress tests/cardiology eval, and GI eval. He is uncertain if the pain was relieved by the nitro (and told not to take anymore since his BP has been low). He is concerned that the chest pains went away after the adrenalectomy and now they've come back, concerned that he has had a recurrence of adrenal mass that I reassured him that would be unlikely. Will discuss with attending faculty tomorrow before referring to cardiology for stress testing.

## 2014-03-23 NOTE — Telephone Encounter (Signed)
Spoke with patient again today, doing better and no current chest pain. He will come in to be seen on 04/03/14 at 8:45am for further discussion and see if cardiology referral would be necessary at that time.

## 2014-04-03 ENCOUNTER — Encounter: Payer: Self-pay | Admitting: Family Medicine

## 2014-04-03 ENCOUNTER — Ambulatory Visit (INDEPENDENT_AMBULATORY_CARE_PROVIDER_SITE_OTHER): Payer: Medicaid Other | Admitting: Family Medicine

## 2014-04-03 VITALS — BP 124/87 | HR 91 | Temp 98.4°F | Wt 153.0 lb

## 2014-04-03 DIAGNOSIS — N390 Urinary tract infection, site not specified: Secondary | ICD-10-CM | POA: Insufficient documentation

## 2014-04-03 DIAGNOSIS — R0789 Other chest pain: Secondary | ICD-10-CM

## 2014-04-03 DIAGNOSIS — F4310 Post-traumatic stress disorder, unspecified: Secondary | ICD-10-CM
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
## 2014-04-03 NOTE — Assessment & Plan Note (Signed)
Has had extensive workup in past with myoview stress test done in 2012, CT chest showing calcifications of the LAD. With recent worsening compared to prior and prolonged uncontrolled hypertension up until adrenalectomy, relief with nitro, there is reasonable suspicion for cardiac origin Plan: cardiology referral for possible stress test (myoview vs exercise, says he likely can do the treadmill despite knee arthritis)

## 2014-04-03 NOTE — Assessment & Plan Note (Addendum)
Has recent increased stress. Has been on medications in the past but was not tolerant of them. Plan: discussed getting him involved with either psychiatrist or counselor to discuss this issue further. Information sheet given and asked him to get an appointment.

## 2014-04-24 ENCOUNTER — Encounter: Payer: Self-pay | Admitting: Cardiology

## 2014-04-24 ENCOUNTER — Ambulatory Visit (INDEPENDENT_AMBULATORY_CARE_PROVIDER_SITE_OTHER): Payer: Medicaid Other | Admitting: Cardiology

## 2014-04-24 VITALS — BP 150/84 | HR 73 | Ht 67.0 in | Wt 156.3 lb

## 2014-04-24 DIAGNOSIS — R0789 Other chest pain: Secondary | ICD-10-CM

## 2014-04-24 NOTE — Patient Instructions (Signed)
Your physician has requested that you have en exercise stress myoview. For further information please visit [email or web address]. Please follow instruction sheet, as given.  Your physician recommends that you schedule a follow-up appointment with Dr. Martinique in 2-3 weeks following the test.

## 2014-04-24 NOTE — Progress Notes (Signed)
04/24/2014 Andrew Huerta   1965/10/22  [id number]  Primary Physicia [name], MD Primary Cardiologist: Dr. Martinique  HPI:  The patient is a 48 y/o male with no prior cardiac history who presents to clinic for evaluation of chest pain. He was evaluated in the [facility] ED recently on 03/18/14. Per records, his pain was felt to be atypical. W/u was negative including negative enzymes and d-dimer. He was seen in f/u by his PCP Dr. Lamar Benes and he referred him back to our clinic for evaluation. He was seen in the past by Dr. Martinique during w/o a [facility] for chest pain. He had a stress test in 2012 that was negative for ischemia. EF was 68% at that time. His pain was felt to be non cardiac. He does have a prior history of secondary hypertension secondary to renal disease. He required surgical resection of his adrenal gland which was performed at [facility] last year. He is a non smoker. No h/o DM, HLD. Family h/o is notable for CAD/MI in both grandfathers during their 42s. Also with h/o Barretts esophagus and hiatal hernia.  He notes frequent left sided chest pain, often radiating to the left arm and left neck. Described as a "stabbing pain". Often occurs at rest. No exacerbating or alleviating factors. Not worsened with exertion or movement. No relation with meals. Sometimes brought on by stress but not always. Symptoms vary in duration. Longest episode lasted 45 minutes before spontaneously resolving.  He is currently CP free.    Current Outpatient Prescriptions  Medication Sig Dispense Refill  . esomeprazole (NEXIUM) 40 MG capsule Take 1 capsule (40 mg total) by mouth daily.  30 capsule  11  . gabapentin (NEURONTIN) 300 MG capsule Take 300 mg by mouth 3 (three) times daily as needed (leg pain).       Marland Kitchen HYDROcodone-acetaminophen (NORCO/VICODIN) 5-325 MG per tablet Take 1-2 tablets by mouth every 8 (eight) hours as needed for moderate pain. Do not fill until 03/15/2014  90 tablet  0  . nitroGLYCERIN  (NITROSTAT) 0.4 MG SL tablet Place 0.4 mg under the tongue every 5 (five) minutes as needed for chest pain.      . traMADol (ULTRAM) 50 MG tablet Take 1 tablet (50 mg total) by mouth every 8 (eight) hours as needed for moderate pain (alternate with the hydrocodone). Do not fill until 04/15/2014  30 tablet  0   No current facility-administered medications for this visit.    Allergies  Allergen Reactions  . Cortisone Acetate Swelling    REACTION: unspecified  . Darvocet [Propoxyphene N-Acetaminophen] Nausea And Vomiting  . Nsaids     Caused stomach ulcers in the past     History   Social History  . Marital Status: Divorced    Spouse Name: N/A    Number of Children: N/A  . Years of Education: N/A   Occupational History  . Not on file.   Social History Main Topics  . Smoking status: Never Smoker   . Smokeless tobacco: Never Used  . Alcohol Use: 0.0 oz/week     Comment: Occasional  . Drug Use: No  . Sexual Activity: Yes   Other Topics Concern  . Not on file   Social History Narrative   Unemployed.    Single dad.      Review of Systems: General: negative for chills, fever, night sweats or weight changes.  Cardiovascular: negative for chest pain, dyspnea on exertion, edema, orthopnea, palpitations, paroxysmal nocturnal dyspnea  or shortness of breath Dermatological: negative for rash Respiratory: negative for cough or wheezing Urologic: negative for hematuria Abdominal: negative for nausea, vomiting, diarrhea, bright red blood per rectum, melena, or hematemesis Neurologic: negative for visual changes, syncope, or dizziness All other systems reviewed and are otherwise negative except as noted above.    Blood pressure 150/84, pulse 73, height 5\' 7"  (1.702 m), weight 156 lb 4.8 oz (70.897 kg).  General appearance: alert, cooperative and no distress Neck: no carotid bruit and no JVD Lungs: clear to auscultation bilaterally Heart: regular rate and rhythm, S1, S2 normal, no  murmur, click, rub or gallop Extremities: no LEE Pulses: 2+ and symmetric Skin: warm and dry Neurologic: Grossly normal  EKG NSR. No ischemic abnormalities. HR 73 bpm  ASSESSMENT AND PLAN:   1. Atypical Chest Pain: EKG non ischemic. Plan for Exercise NST to r/o ischemia.    PLAN  Plan for NST to assess for ischemia. F/u with myself or Dr. Martinique after stress test to review results.   SIMMONS, BRITTAINYPA-C 04/24/2014 10:13 AM

## 2014-04-25 ENCOUNTER — Telehealth (HOSPITAL_COMMUNITY): Payer: Self-pay

## 2014-04-25 NOTE — Telephone Encounter (Signed)
Encounter complete. 

## 2014-04-27 ENCOUNTER — Ambulatory Visit (HOSPITAL_BASED_OUTPATIENT_CLINIC_OR_DEPARTMENT_OTHER)
Admission: RE | Admit: 2014-04-27 | Discharge: 2014-04-27 | Disposition: A | Discharge status: Home / Self Care | Payer: Medicaid Other | Source: Ambulatory Visit | Attending: Cardiovascular Disease | Admitting: Cardiovascular Disease

## 2014-04-27 ENCOUNTER — Ambulatory Visit (HOSPITAL_BASED_OUTPATIENT_CLINIC_OR_DEPARTMENT_OTHER)
Admission: RE | Admit: 2014-04-27 | Discharge: 2014-04-28 | Disposition: A | Discharge status: Home / Self Care | Payer: Medicaid Other | Source: Ambulatory Visit | Attending: Cardiovascular Disease | Admitting: Cardiovascular Disease

## 2014-04-27 ENCOUNTER — Encounter (HOSPITAL_COMMUNITY): Payer: Self-pay | Admitting: General Practice

## 2014-04-27 ENCOUNTER — Encounter (HOSPITAL_COMMUNITY)
Admission: RE | Disposition: A | Discharge status: Home / Self Care | Payer: Medicaid Other | Source: Ambulatory Visit | Attending: Cardiovascular Disease

## 2014-04-27 DIAGNOSIS — I10 Essential (primary) hypertension: Secondary | ICD-10-CM

## 2014-04-27 DIAGNOSIS — R0789 Other chest pain: Secondary | ICD-10-CM

## 2014-04-27 DIAGNOSIS — F4310 Post-traumatic stress disorder, unspecified: Secondary | ICD-10-CM | POA: Diagnosis present

## 2014-04-27 DIAGNOSIS — I2510 Atherosclerotic heart disease of native coronary artery without angina pectoris: Secondary | ICD-10-CM | POA: Insufficient documentation

## 2014-04-27 DIAGNOSIS — D497 Neoplasm of unspecified behavior of endocrine glands and other parts of nervous system: Secondary | ICD-10-CM | POA: Diagnosis present

## 2014-04-27 DIAGNOSIS — K22711 Barrett's esophagus with high grade dysplasia: Secondary | ICD-10-CM | POA: Diagnosis present

## 2014-04-27 DIAGNOSIS — E785 Hyperlipidemia, unspecified: Secondary | ICD-10-CM | POA: Diagnosis present

## 2014-04-27 DIAGNOSIS — K2270 Barrett's esophagus without dysplasia: Secondary | ICD-10-CM | POA: Diagnosis present

## 2014-04-27 DIAGNOSIS — M1710 Unilateral primary osteoarthritis, unspecified knee: Secondary | ICD-10-CM | POA: Diagnosis present

## 2014-04-27 DIAGNOSIS — IMO0002 Reserved for concepts with insufficient information to code with codable children: Secondary | ICD-10-CM | POA: Diagnosis present

## 2014-04-27 DIAGNOSIS — E1169 Type 2 diabetes mellitus with other specified complication: Secondary | ICD-10-CM | POA: Diagnosis present

## 2014-04-27 DIAGNOSIS — I25110 Atherosclerotic heart disease of native coronary artery with unstable angina pectoris: Secondary | ICD-10-CM

## 2014-04-27 DIAGNOSIS — I200 Unstable angina: Secondary | ICD-10-CM | POA: Insufficient documentation

## 2014-04-27 DIAGNOSIS — Z955 Presence of coronary angioplasty implant and graft: Secondary | ICD-10-CM

## 2014-04-27 HISTORY — PX: LEFT HEART CATHETERIZATION WITH CORONARY ANGIOGRAM: SHX5451

## 2014-04-27 HISTORY — DX: Atherosclerotic heart disease of native coronary artery without angina pectoris: I25.10

## 2014-04-27 HISTORY — PX: CARDIAC CATHETERIZATION: SHX172

## 2014-04-27 HISTORY — PX: CORONARY ANGIOPLASTY WITH STENT PLACEMENT: SHX49

## 2014-04-27 HISTORY — DX: Calculus of kidney: N20.0

## 2014-04-27 HISTORY — DX: Dorsalgia, unspecified: M54.9

## 2014-04-27 HISTORY — DX: Other chronic pain: G89.29

## 2014-04-27 LAB — POCT I-STAT, CHEM 8
BUN: 12 mg/dL (ref 6–23)
CHLORIDE: 101 meq/L (ref 96–112)
Calcium, Ion: 1.23 mmol/L (ref 1.12–1.23)
Creatinine, Ser: 1 mg/dL (ref 0.50–1.35)
Glucose, Bld: 85 mg/dL (ref 70–99)
HEMATOCRIT: 43 % (ref 39.0–52.0)
Hemoglobin: 14.6 g/dL (ref 13.0–17.0)
POTASSIUM: 3.9 meq/L (ref 3.7–5.3)
Sodium: 139 mEq/L (ref 137–147)
TCO2: 28 mmol/L (ref 0–100)

## 2014-04-27 LAB — CBC
HEMATOCRIT: 42 % (ref 39.0–52.0)
Hemoglobin: 14.6 g/dL (ref 13.0–17.0)
MCH: 31.6 pg (ref 26.0–34.0)
MCHC: 34.8 g/dL (ref 30.0–36.0)
MCV: 90.9 fL (ref 78.0–100.0)
Platelets: 209 10*3/uL (ref 150–400)
RBC: 4.62 MIL/uL (ref 4.22–5.81)
RDW: 13.2 % (ref 11.5–15.5)
WBC: 8.9 10*3/uL (ref 4.0–10.5)

## 2014-04-27 LAB — BASIC METABOLIC PANEL
Anion gap: 14 (ref 5–15)
BUN: 13 mg/dL (ref 6–23)
CHLORIDE: 101 meq/L (ref 96–112)
CO2: 25 mEq/L (ref 19–32)
Calcium: 9 mg/dL (ref 8.4–10.5)
Creatinine, Ser: 0.87 mg/dL (ref 0.50–1.35)
GFR calc Af Amer: >90 mL/min (ref >90)
GFR calc non Af Amer: >90 mL/min (ref >90)
GLUCOSE: 85 mg/dL (ref 70–99)
POTASSIUM: 4.3 meq/L (ref 3.7–5.3)
Sodium: 140 mEq/L (ref 137–147)

## 2014-04-27 LAB — PROTIME-INR
INR: 1.06 (ref 0.00–1.49)
Prothrombin Time: 13.8 seconds (ref 11.6–15.2)

## 2014-04-27 LAB — APTT: APTT: 34 s (ref 24–37)

## 2014-04-27 LAB — POCT ACTIVATED CLOTTING TIME: Activated Clotting Time: 647 seconds

## 2014-04-27 SURGERY — LEFT HEART CATHETERIZATION WITH CORONARY ANGIOGRAM
Anesthesia: LOCAL

## 2014-04-27 MED ORDER — MORPHINE SULFATE 10 MG/ML IJ SOLN: 2.0000 mg | INTRAMUSCULAR | Status: DC | PRN

## 2014-04-27 MED ORDER — HYDRALAZINE HCL 20 MG/ML IJ SOLN: 10.0000 mg | Freq: Four times a day (QID) | INTRAMUSCULAR | Status: DC | PRN

## 2014-04-27 MED ORDER — HYDROMORPHONE HCL PF 1 MG/ML IJ SOLN
INTRAMUSCULAR | Status: AC
  Filled 2014-04-27: qty 1

## 2014-04-27 MED ORDER — PANTOPRAZOLE SODIUM 40 MG PO TBEC
80.0000 mg | DELAYED_RELEASE_TABLET | Freq: Two times a day (BID) | ORAL | Status: DC
  Administered 2014-04-27 – 2014-04-28 (×2): 80 mg via ORAL
  Filled 2014-04-27 (×2): qty 2

## 2014-04-27 MED ORDER — HEPARIN SODIUM (PORCINE) 1000 UNIT/ML IJ SOLN
INTRAMUSCULAR | Status: AC
  Filled 2014-04-27: qty 1

## 2014-04-27 MED ORDER — TRAMADOL HCL 50 MG PO TABS: 50.0000 mg | ORAL_TABLET | Freq: Three times a day (TID) | ORAL | Status: DC | PRN

## 2014-04-27 MED ORDER — HYDROCODONE-ACETAMINOPHEN 5-325 MG PO TABS
1.0000 | ORAL_TABLET | Freq: Three times a day (TID) | ORAL | Status: DC | PRN
  Administered 2014-04-27: 2 via ORAL
  Administered 2014-04-28: 01:00:00 1 via ORAL
  Filled 2014-04-27 (×2): qty 2

## 2014-04-27 MED ORDER — NITROGLYCERIN 0.2 MG/ML ON CALL CATH LAB
INTRAVENOUS | Status: AC
  Filled 2014-04-27: qty 1

## 2014-04-27 MED ORDER — SODIUM CHLORIDE 0.9 % IJ SOLN: 3.0000 mL | Freq: Two times a day (BID) | INTRAMUSCULAR | Status: DC

## 2014-04-27 MED ORDER — MIDAZOLAM HCL 2 MG/2ML IJ SOLN
INTRAMUSCULAR | Status: AC
  Filled 2014-04-27: qty 2

## 2014-04-27 MED ORDER — BIVALIRUDIN 250 MG IV SOLR
INTRAVENOUS | Status: AC
  Filled 2014-04-27: qty 250

## 2014-04-27 MED ORDER — SODIUM CHLORIDE 0.9 % IV SOLN: 1.0000 mL/kg/h | INTRAVENOUS | Status: DC

## 2014-04-27 MED ORDER — SODIUM CHLORIDE 0.9 % IV SOLN: INTRAVENOUS | Status: DC

## 2014-04-27 MED ORDER — HEPARIN (PORCINE) IN NACL 2-0.9 UNIT/ML-% IJ SOLN
INTRAMUSCULAR | Status: AC
  Filled 2014-04-27: qty 1000

## 2014-04-27 MED ORDER — MORPHINE SULFATE 2 MG/ML IJ SOLN: 2.0000 mg | INTRAMUSCULAR | Status: DC | PRN

## 2014-04-27 MED ORDER — SODIUM CHLORIDE 0.9 % IV SOLN: 250.0000 mL | INTRAVENOUS | Status: DC | PRN

## 2014-04-27 MED ORDER — DIPHENHYDRAMINE HCL 25 MG PO CAPS
50.0000 mg | ORAL_CAPSULE | Freq: Four times a day (QID) | ORAL | Status: DC | PRN
  Administered 2014-04-27: 50 mg via ORAL
  Filled 2014-04-27: qty 2

## 2014-04-27 MED ORDER — TICAGRELOR 90 MG PO TABS
ORAL_TABLET | ORAL | Status: AC
  Filled 2014-04-27: qty 1

## 2014-04-27 MED ORDER — NITROGLYCERIN 0.4 MG SL SUBL
SUBLINGUAL_TABLET | SUBLINGUAL | Status: AC
  Administered 2014-04-27: 0.4 mg via SUBLINGUAL
  Filled 2014-04-27: qty 1

## 2014-04-27 MED ORDER — SODIUM CHLORIDE 0.9 % IJ SOLN: 3.0000 mL | INTRAMUSCULAR | Status: DC | PRN

## 2014-04-27 MED ORDER — TICAGRELOR 90 MG PO TABS
ORAL_TABLET | ORAL | Status: AC
  Administered 2014-04-28: 90 mg via ORAL
  Filled 2014-04-27: qty 1

## 2014-04-27 MED ORDER — ASPIRIN 81 MG PO CHEW: 324.0000 mg | CHEWABLE_TABLET | ORAL | Status: DC

## 2014-04-27 MED ORDER — NITROGLYCERIN 2 % TD OINT
1.0000 [in_us] | TOPICAL_OINTMENT | Freq: Four times a day (QID) | TRANSDERMAL | Status: DC
  Filled 2014-04-27: qty 30

## 2014-04-27 MED ORDER — TICAGRELOR 90 MG PO TABS
90.0000 mg | ORAL_TABLET | Freq: Two times a day (BID) | ORAL | Status: DC
  Administered 2014-04-28 (×2): 90 mg via ORAL
  Filled 2014-04-27 (×3): qty 1

## 2014-04-27 MED ORDER — MIDAZOLAM HCL 2 MG/2ML IJ SOLN
INTRAMUSCULAR | Status: AC
Start: 2014-04-27 — End: 2014-04-27
  Filled 2014-04-27: qty 2

## 2014-04-27 MED ORDER — METOPROLOL TARTRATE 25 MG PO TABS
25.0000 mg | ORAL_TABLET | Freq: Two times a day (BID) | ORAL | Status: DC
  Administered 2014-04-27 – 2014-04-28 (×2): 25 mg via ORAL
  Filled 2014-04-27 (×3): qty 1

## 2014-04-27 MED ORDER — VERAPAMIL HCL 2.5 MG/ML IV SOLN
INTRAVENOUS | Status: AC
  Filled 2014-04-27: qty 2

## 2014-04-27 MED ORDER — SODIUM CHLORIDE 0.9 % IV SOLN: 1.0000 mL/kg/h | INTRAVENOUS | Status: AC

## 2014-04-27 MED ORDER — FENTANYL CITRATE 0.05 MG/ML IJ SOLN
INTRAMUSCULAR | Status: AC
  Filled 2014-04-27: qty 2

## 2014-04-27 MED ORDER — ASPIRIN 81 MG PO CHEW
81.0000 mg | CHEWABLE_TABLET | Freq: Every day | ORAL | Status: DC
  Administered 2014-04-28: 81 mg via ORAL
  Filled 2014-04-27: qty 1

## 2014-04-27 MED ORDER — GABAPENTIN 300 MG PO CAPS
300.0000 mg | ORAL_CAPSULE | Freq: Three times a day (TID) | ORAL | Status: DC | PRN
  Filled 2014-04-27: qty 1

## 2014-04-27 MED ORDER — ONDANSETRON HCL 4 MG/2ML IJ SOLN: 4.0000 mg | Freq: Four times a day (QID) | INTRAMUSCULAR | Status: DC | PRN

## 2014-04-27 MED ORDER — ATORVASTATIN CALCIUM 80 MG PO TABS
80.0000 mg | ORAL_TABLET | Freq: Every day | ORAL | Status: DC
  Administered 2014-04-27: 80 mg via ORAL
  Filled 2014-04-27 (×2): qty 1

## 2014-04-27 MED ORDER — NITROGLYCERIN 0.4 MG SL SUBL
0.4000 mg | SUBLINGUAL_TABLET | SUBLINGUAL | Status: DC | PRN
  Administered 2014-04-27: 0.4 mg via SUBLINGUAL
  Filled 2014-04-27: qty 25

## 2014-04-27 MED ORDER — TECHNETIUM TC 99M SESTAMIBI GENERIC - CARDIOLITE
10.8000 | Freq: Once | INTRAVENOUS | Status: AC | PRN
  Administered 2014-04-27: 11 via INTRAVENOUS

## 2014-04-27 MED ORDER — TECHNETIUM TC 99M SESTAMIBI GENERIC - CARDIOLITE
30.8000 | Freq: Once | INTRAVENOUS | Status: AC | PRN
  Administered 2014-04-27: 30.8 via INTRAVENOUS

## 2014-04-27 NOTE — CV Procedure (Signed)
    Cardiac Catheterization Procedure Note  Name: Andrew Huerta MRN: [id number] DOB: [birth year]  Procedure: Left Heart Cath, Selective Coronary Angiography, LV angiography, PTCA and stenting of the mid LAD and mid LCx  Indication: 48 yo WM with unstable angina. Myoview earlier today showed multiple perfusion abnormalities.   Procedural Details:  The right wrist was prepped, draped, and anesthetized with 1% lidocaine. Using the modified Seldinger technique, a 6 French slender sheath was introduced into the right radial artery. 3 mg of verapamil was administered through the sheath, weight-based unfractionated heparin was administered intravenously. Standard Judkins catheters were used for selective coronary angiography and left ventriculography. Catheter exchanges were performed over an exchange length guidewire.  PROCEDURAL FINDINGS Hemodynamics: AO 132/86 mean 109 mm Hg LV 142/11 mm Hg   Coronary angiography: Coronary dominance: right  Left mainstem: Normal.   Left anterior descending (LAD): 30% stenosis at the origin, 80-90% stenosis in the mid LAD.  Left circumflex (LCx): Long 80-90% stenosis in the mid LCx.  Right coronary artery (RCA): Diffuse 10-20% irregularities. There is a 30% stenosis in the distal vessel prior to the bifurcation.  Left ventriculography: Left ventricular systolic function is normal, LVEF is estimated at 55-65%, there is no significant mitral regurgitation   PCI Note:  Following the diagnostic procedure, the decision was made to proceed with PCI.  Weight-based bivalirudin was given for anticoagulation. 4 baby ASA were given. Brilinta 180 mg was given orally. Once a therapeutic ACT was achieved, a 6 [place] XBLAD guide catheter was inserted.  A prowater coronary guidewire was used to cross the lesion in the mid LAD.  The lesion was predilated with a 2.5 balloon.  The lesion was then stented with a 3.0 x 16 mm Promus  stent.  The stent was postdilated with a 3.0  mm noncompliant balloon.  Following PCI, there was 0% residual stenosis and TIMI-3 flow.  We next performed PCI of the LCx. The prowater wire was used to cross the lesion and it was predilated with a 2.5 mm balloon. The lesion was then stented with a 3.5 x 28 mm Promus stent. The stent was post dilated with a 3.5 mm noncompliant balloon. Following PCI there was 0% residual stenosis and TIMI 3 flow. Final angiography confirmed an excellent result. The patient tolerated the procedure well but had complaints of right arm pain and facial itching throughout the procedure. He was treated with IV Versed/ Fentany/ and Dilaudid as well as IV Benadryl.  There were no immediate procedural complications. A TR band was used for radial hemostasis. The patient was transferred to the post catheterization recovery area for further monitoring.  PCI Data: Vessel - LAD/Segment - mid Percent Stenosis (pre)  80-90% TIMI-flow 3 Stent 3.0 x 16 mm Promus Percent Stenosis (post) 0% TIMI-flow (post) 3  Vessel #2- LCx-mid Percent stenosis (pre)-80-90% TIMI flow 3 Stent: 3.5 x 28 mm Promus Percent stenosis (post)- 0% TIMI flow 3  Final Conclusions:   1. Severe 2 vessel obstructive CAD 2. Normal LV function. 3. Successful stenting of the mid LAD with DES 4. Successful stenting of the mid LCx with DES   Recommendations:  DAPT for one year. Risk factor modification. Anticipate DC in am if stable.   Andrew Huerta, [place] 04/27/2014, 2:18 PM

## 2014-04-27 NOTE — Interval H&P Note (Signed)
History and Physical Interval Note:  04/27/2014 1:02 PM  [name]  has presented today for surgery, with the diagnosis of cp  The various methods of treatment have been discussed with the patient and family. After consideration of risks, benefits and other options for treatment, the patient has consented to  Procedure(s): LEFT HEART CATHETERIZATION WITH CORONARY ANGIOGRAM (N/A) as a surgical intervention .  The patient's history has been reviewed, patient examined, no change in status, stable for surgery.  I have reviewed the patient's chart and labs.  Questions were answered to the patient's satisfaction.    Cath Lab Visit (complete for each Cath Lab visit)  Clinical Evaluation Leading to the Procedure:   ACS: Yes.    Non-ACS:    Anginal Classification: CCS IV  Anti-ischemic medical therapy: No Therapy  Non-Invasive Test Results: High-risk stress test findings: cardiac mortality >3%/year  Prior CABG: No previous CABG       Collier Salina [facility] 04/27/2014 1:02 PM

## 2014-04-27 NOTE — Procedures (Addendum)
[place] [address] [place] [address] [phone]  Cardiology Nuclear Med Study  Andrew Huerta is a 48 y.o. male     MRN : [id number]     DOB: 1965-12-11  Procedure Date: 04/27/2014  Nuclear Med Background Indication for Stress Test:  [facility] History:  No prior cardiac or respiratory history reported;No prior NUC MPI for comparison. Cardiac Risk Factors: Family History - CAD and Hypertension  Symptoms:  Chest Pain, Dizziness, Light-Headedness and Palpitations   Nuclear Pre-Procedure Caffeine/Decaff Intake:  7:00pm NPO After: 5:00am   IV Site: R Forearm  IV 0.9% NS with Angio Cath:  22g  Chest Size (in):  40"  IV Started by: Rolene Course, RN  Height: 5\' 7"  (1.702 m)  Cup Size: n/a  BMI:  Body mass index is 24.43 kg/(m^2). Weight:  156 lb (70.761 kg)   Tech Comments:  n/a    Nuclear Med Study 1 or 2 day study: 1 day  Stress Test Type:  Stress  Order Authorizing Provider:  Peter Martinique, MD   Resting Radionuclide: Technetium 30m Sestamibi  Resting Radionuclide Dose: 10.8 mCi   Stress Radionuclide:  Technetium 9m Sestamibi  Stress Radionuclide Dose: 30.8 mCi           Stress Protocol Rest HR: 74 Stress HR:181  Rest BP:148/93 Stress BP: 180/98  Exercise Time (min): 11:47 METS: 13.40   Predicted Max HR: 172 bpm % Max HR: 105.23 bpm Rate Pressure Product: 32580  Dose of Adenosine (mg):  n/a Dose of Lexiscan: n/a mg  Dose of Atropine (mg): n/a Dose of Dobutamine: n/a mcg/kg/min (at max HR)  Stress Test Technologist: Mellody Memos, CCT Nuclear Technologist: Otho Perl, CNMT   Rest Procedure:  Myocardial perfusion imaging was performed at rest 45 minutes following the intravenous administration of Technetium 31m Sestamibi. Stress Procedure:  The patient performed treadmill exercise using a Bruce  Protocol for 11 minutes 47 seconds. The patient stopped due to generalized fatigue. Patient experienced  chest pain while walking but states he had prior to starting test.  There were no significant ST-T wave changes.  Technetium 33m Sestamibi was injectedIV at peak exercise and myocardial perfusion imaging was performed after a brief delay.  Transient Ischemic Dilatation (Normal <1.22):  0.99 QGS EDV:  89 ml QGS ESV:  38 ml LV Ejection Fraction: 59%      Rest ECG: NSR - Normal EKG  Stress ECG: No significant ST segment change suggestive of ischemia.  QPS Raw Data Images:  Normal; no motion artifact; normal heart/lung ratio. Stress Images:  There is mild to moderate reduction in tracer uptake in the anterior wall, septum and apex.There is mildly reduced uptake in the inferior wall. Rest Images:  Normal homogeneous uptake in all areas of the myocardium. Subtraction (SDS):  These findings are consistent with ischemia. LV Wall Motion:  NL LV Function; NL Wall Motion  Impression Exercise Capacity:  Good exercise capacity. BP Response:  Normal blood pressure response. Clinical Symptoms:  Typical chest pain. ECG Impression:  No significant ST segment change suggestive of ischemia. Comparison with Prior Nuclear Study: No previous nuclear study performed   Overall Impression:  High risk stress nuclear study suggestive of ischemia in the LAD artery distribution and possibly also in the right coronary artery distribution.Sanda Klein, MD  04/27/2014 10:57 AM

## 2014-04-27 NOTE — Progress Notes (Signed)
TR BAND REMOVAL  LOCATION:    right radial  DEFLATED PER PROTOCOL:    Yes.    TIME BAND OFF / DRESSING APPLIED:    1930   SITE UPON ARRIVAL:    Level 0  SITE AFTER BAND REMOVAL:    Level 0  REVERSE Jocelyn'S TEST:     negative  CIRCULATION SENSATION AND MOVEMENT:    Within Normal Limits   Yes.    COMMENTS:   Tr band removed without complications  Post removal instructions given.

## 2014-04-27 NOTE — Progress Notes (Signed)
Called because pt having more chest pain. 7/10 before SL NTG, now 5/10.   Called cath lab, they will hold a room. Sec C to do Istat, send other labs, rx w/ NTG paste and SL. Use morphine as well. ECG reviewed and not acute. Cath soon.

## 2014-04-27 NOTE — Progress Notes (Signed)
High risk abnormalities on perfusion study, possibly indicative of multivessel CAD. Developed chest pain during exercise, now improved. Discussed findings and recommended he undergo cardiac catheterization  - will schedule for later today. This procedure, as well as possible PCI/stent, has been fully reviewed with the patient and informed consent has been obtained. Sanda Klein, MD, [facility] [phone] office [phone] pager \

## 2014-04-27 NOTE — Care Management Note (Addendum)
  Page 1 of 1   04/28/2014     1:20:29 PM CARE MANAGEMENT NOTE 04/28/2014  Patient:  Andrew Huerta, Andrew Huerta   Account Number:  [id number]  Date Initiated:  04/27/2014  Documentation initiated by:  Damyen Knoll  Subjective/Objective Assessment:   unstable angina     Action/Plan:   CM to follow for disposition needs   Anticipated DC Date:  04/28/2014   Anticipated DC Plan:  HOME/SELF CARE         Choice offered to / List presented to:             Status of service:  Completed, signed off Medicare Important Message given?   (If response is "NO", the following Medicare IM given date fields will be blank) Date Medicare IM given:   Medicare IM given by:   Date Additional Medicare IM given:   Additional Medicare IM given by:    Discharge Disposition:  HOME/SELF CARE  Per UR Regulation:    If discussed at Long Length of Stay Meetings, dates discussed:    Comments:  Cabrina Shiroma RN, BSN, MSHL, CCM  Nurse - Case Manager, (Unit [phone]  04/27/2014 Benefits Check:   ticagrelor (BRILINTA) tablet 90 mg  : Dose 90 mg  :  Oral  :  2 times daily

## 2014-04-27 NOTE — H&P (View-Only) (Signed)
High risk abnormalities on perfusion study, possibly indicative of multivessel CAD. Developed chest pain during exercise, now improved. Discussed findings and recommended he undergo cardiac catheterization  - will schedule for later today. This procedure, as well as possible PCI/stent, has been fully reviewed with the patient and informed consent has been obtained. Sanda Klein, MD, [facility] [phone] office [phone] pager \

## 2014-04-28 ENCOUNTER — Other Ambulatory Visit: Payer: Self-pay | Admitting: *Deleted

## 2014-04-28 ENCOUNTER — Telehealth: Payer: Self-pay | Admitting: Cardiology

## 2014-04-28 ENCOUNTER — Encounter (HOSPITAL_COMMUNITY): Payer: Self-pay | Admitting: Physician Assistant

## 2014-04-28 DIAGNOSIS — I200 Unstable angina: Secondary | ICD-10-CM

## 2014-04-28 DIAGNOSIS — D497 Neoplasm of unspecified behavior of endocrine glands and other parts of nervous system: Secondary | ICD-10-CM | POA: Diagnosis present

## 2014-04-28 DIAGNOSIS — I10 Essential (primary) hypertension: Secondary | ICD-10-CM

## 2014-04-28 DIAGNOSIS — I2510 Atherosclerotic heart disease of native coronary artery without angina pectoris: Secondary | ICD-10-CM | POA: Diagnosis present

## 2014-04-28 DIAGNOSIS — Z9861 Coronary angioplasty status: Secondary | ICD-10-CM

## 2014-04-28 DIAGNOSIS — E785 Hyperlipidemia, unspecified: Secondary | ICD-10-CM

## 2014-04-28 LAB — CBC
HEMATOCRIT: 43.3 % (ref 39.0–52.0)
HEMOGLOBIN: 14.9 g/dL (ref 13.0–17.0)
MCH: 31.6 pg (ref 26.0–34.0)
MCHC: 34.4 g/dL (ref 30.0–36.0)
MCV: 91.7 fL (ref 78.0–100.0)
Platelets: 198 10*3/uL (ref 150–400)
RBC: 4.72 MIL/uL (ref 4.22–5.81)
RDW: 13.2 % (ref 11.5–15.5)
WBC: 9.4 10*3/uL (ref 4.0–10.5)

## 2014-04-28 LAB — BASIC METABOLIC PANEL
ANION GAP: 13 (ref 5–15)
BUN: 12 mg/dL (ref 6–23)
CHLORIDE: 104 meq/L (ref 96–112)
CO2: 23 meq/L (ref 19–32)
CREATININE: 0.96 mg/dL (ref 0.50–1.35)
Calcium: 9.1 mg/dL (ref 8.4–10.5)
GFR calc Af Amer: >90 mL/min (ref >90)
GFR calc non Af Amer: >90 mL/min (ref >90)
Glucose, Bld: 111 mg/dL — ABNORMAL HIGH (ref 70–99)
Potassium: 4.4 mEq/L (ref 3.7–5.3)
SODIUM: 140 meq/L (ref 137–147)

## 2014-04-28 MED ORDER — LISINOPRIL 5 MG PO TABS: 5.0000 mg | ORAL_TABLET | Freq: Every day | ORAL | Status: DC

## 2014-04-28 MED ORDER — LISINOPRIL 5 MG PO TABS
5.0000 mg | ORAL_TABLET | Freq: Every day | ORAL | Status: DC
  Administered 2014-04-28: 10:00:00 5 mg via ORAL
  Filled 2014-04-28: qty 1

## 2014-04-28 MED ORDER — TICAGRELOR 90 MG PO TABS: 90.0000 mg | ORAL_TABLET | Freq: Two times a day (BID) | ORAL | Status: DC

## 2014-04-28 MED ORDER — METOPROLOL TARTRATE 25 MG PO TABS: 25.0000 mg | ORAL_TABLET | Freq: Two times a day (BID) | ORAL | Status: DC

## 2014-04-28 MED ORDER — ASPIRIN 81 MG PO CHEW: 81.0000 mg | CHEWABLE_TABLET | Freq: Every day | ORAL | Status: DC

## 2014-04-28 MED ORDER — ATORVASTATIN CALCIUM 80 MG PO TABS: 80.0000 mg | ORAL_TABLET | Freq: Every day | ORAL | Status: DC

## 2014-04-28 MED ORDER — NITROGLYCERIN 0.4 MG SL SUBL: 0.4000 mg | SUBLINGUAL_TABLET | SUBLINGUAL | Status: DC | PRN

## 2014-04-28 MED FILL — Sodium Chloride IV Soln 0.9%: INTRAVENOUS | Qty: 50 | Status: AC

## 2014-04-28 NOTE — Discharge Summary (Signed)
Discharge Summary   Patient ID: Andrew Huerta MRN: [id number], DOB/AGE: 1965-09-29 48 y.o. Admit date: 04/27/2014 D/C date:     04/28/2014  Primary Cardiologist: Dr. Martinique  Principal Problem:   Unstable angina Active Problems:   Barrett's esophagus   DEGENERATIVE JOINT DISEASE, BOTH KNEES, SEVERE   PTSD (post-traumatic stress disorder)   CAD (coronary artery disease)   Hyperlipidemia   Hypertension   Adrenal tumor   Admission Dates: 04/27/14-04/28/14  Discharge Diagnosis: [place] s/p DES to mLAD and DES to mLCx  HPI: Andrew Huerta is a 48 y.o. male with a history of secondary hypertension s/p adrenal gland resection at [facility], Barretts esophagus and hiatal hernia and no prior cardiac disease who presented to [facility] on 04/27/14 for elective cardiac cath after an abnormal nuclear stress test.  He was seen in the clinic on 04/24/14 for evaluation of chest pain. He was evaluated in the [facility] ED recently on 03/18/14. Per records, his pain was felt to be atypical. W/u was negative including negative enzymes and d-dimer. He was seen in f/u by his PCP Dr. Lamar Benes and he referred him back to our clinic for evaluation. He was seen in the past by Dr. Martinique during w/o a [facility] for chest pain. He had a stress test in 2012 that was negative for ischemia. EF was 68% at that time. His pain was felt to be non cardiac. He does have a prior history of secondary hypertension secondary to renal disease. He required surgical resection of his adrenal gland which was performed at [facility] last year. He is a non smoker. No h/o DM, HLD. Family h/o is notable for CAD/MI in both grandfathers during their 61s.  He noted frequent left sided stabbing chest pain, often radiating to the left arm and left neck that often occurred at rest. He was referred for an exercise NST to rule out ischemia which he underwent on 04/27/14. This revealed high risk abnormalities possibly indicative of multivessel CAD so it was elected to proceed with  elective cardiac cath later that day.   Hospital Course  [place]/CAD- abnormal exercise nuclear stress test on 04/27/14. -- s/p LHC on 04/27/14 with   1. Severe 2 vessel obstructive CAD   2. Normal LV function.   3. Successful stenting of the mid LAD with DES   4. Successful stenting of the mid LCx with DES  -- Continue DAPT with ASA/Brilinta for at least 1 year. Continue ASA, statin and BB  HTN- continue metoprolol 25mg  BID. He also has several elevated diastolic BP readings during his admission. BP high in the office as well. He was started on Lisinopril 5 mg daily given his CAD.   HLD- continue statin  Barrett's esophagus - stable. Continue Neixum  The patient has had an uncomplicated hospital course and is recovering well. The radial catheter site is stable. He has been seen by Dr. Irish Lack today and deemed ready for discharge home. All follow-up appointments have been scheduled.  A written RX for a 30 day free supply of Brilinta was provided for the patient. Discharge medications are listed below.   Discharge Vitals: Blood pressure 143/91, pulse 73, temperature 98 F (36.7 C), temperature source Oral, resp. rate 18, height 5\' 7"  (1.702 m), weight 162 lb 7.7 oz (73.7 kg), SpO2 99.00%.  Labs: Lab Results  Component Value Date   WBC 9.4 04/28/2014   HGB 14.9 04/28/2014   HCT 43.3 04/28/2014   MCV 91.7 04/28/2014   PLT  198 04/28/2014     Recent Labs Lab 04/28/14 0447  NA 140  K 4.4  CL 104  CO2 23  BUN 12  CREATININE 0.96  CALCIUM 9.1  GLUCOSE 111*    Diagnostic Studies/Procedures  Cardiac Catheterization Procedure Note  Name: Andrew Huerta  MRN: [id number]  DOB: [birth year]  Procedure: Left Heart Cath, Selective Coronary Angiography, LV angiography, PTCA and stenting of the mid LAD and mid LCx  Indication: 48 yo WM with unstable angina. Myoview earlier today showed multiple perfusion abnormalities.  Procedural Details: The right wrist was prepped, draped, and anesthetized  with 1% lidocaine. Using the modified Seldinger technique, a 6 French slender sheath was introduced into the right radial artery. 3 mg of verapamil was administered through the sheath, weight-based unfractionated heparin was administered intravenously. Standard Judkins catheters were used for selective coronary angiography and left ventriculography. Catheter exchanges were performed over an exchange length guidewire.  PROCEDURAL FINDINGS  Hemodynamics:  AO 132/86 mean 109 mm Hg  LV 142/11 mm Hg  Coronary angiography:  Coronary dominance: right  Left mainstem: Normal.  Left anterior descending (LAD): 30% stenosis at the origin, 80-90% stenosis in the mid LAD.  Left circumflex (LCx): Long 80-90% stenosis in the mid LCx.  Right coronary artery (RCA): Diffuse 10-20% irregularities. There is a 30% stenosis in the distal vessel prior to the bifurcation.  Left ventriculography: Left ventricular systolic function is normal, LVEF is estimated at 55-65%, there is no significant mitral regurgitation  PCI Note: Following the diagnostic procedure, the decision was made to proceed with PCI. Weight-based bivalirudin was given for anticoagulation. 4 baby ASA were given. Brilinta 180 mg was given orally. Once a therapeutic ACT was achieved, a 6 [place] XBLAD guide catheter was inserted. A prowater coronary guidewire was used to cross the lesion in the mid LAD. The lesion was predilated with a 2.5 balloon. The lesion was then stented with a 3.0 x 16 mm Promus stent. The stent was postdilated with a 3.0 mm noncompliant balloon. Following PCI, there was 0% residual stenosis and TIMI-3 flow.  We next performed PCI of the LCx. The prowater wire was used to cross the lesion and it was predilated with a 2.5 mm balloon. The lesion was then stented with a 3.5 x 28 mm Promus stent. The stent was post dilated with a 3.5 mm noncompliant balloon. Following PCI there was 0% residual stenosis and TIMI 3 flow. Final angiography  confirmed an excellent result. The patient tolerated the procedure well but had complaints of right arm pain and facial itching throughout the procedure. He was treated with IV Versed/ Fentany/ and Dilaudid as well as IV Benadryl. There were no immediate procedural complications. A TR band was used for radial hemostasis. The patient was transferred to the post catheterization recovery area for further monitoring.  PCI Data:  Vessel - LAD/Segment - mid  Percent Stenosis (pre) 80-90%  TIMI-flow 3  Stent 3.0 x 16 mm Promus  Percent Stenosis (post) 0%  TIMI-flow (post) 3  Vessel #2- LCx-mid  Percent stenosis (pre)-80-90%  TIMI flow 3  Stent: 3.5 x 28 mm Promus  Percent stenosis (post)- 0%  TIMI flow 3  Final Conclusions:  1. Severe 2 vessel obstructive CAD  2. Normal LV function.  3. Successful stenting of the mid LAD with DES  4. Successful stenting of the mid LCx with DES  Recommendations:  DAPT for one year. Risk factor modification. Anticipate DC in am if stable.  Exercise nuclear stress test 04/27/14 Impression  Exercise Capacity: Good exercise capacity.  BP Response: Normal blood pressure response.  Clinical Symptoms: Typical chest pain.  ECG Impression: No significant ST segment change suggestive of ischemia.  Comparison with Prior Nuclear Study: No previous nuclear study performed  Overall Impression: High risk stress nuclear study suggestive of ischemia in the LAD artery distribution and possibly also in the right coronary artery distribution.Marland Kitchen  Discharge Medications     Medication List         aspirin 81 MG chewable tablet  Chew 1 tablet (81 mg total) by mouth daily.     atorvastatin 80 MG tablet  Commonly known as:  LIPITOR  Take 1 tablet (80 mg total) by mouth daily at 6 PM.     esomeprazole 40 MG capsule  Commonly known as:  NEXIUM  Take 1 capsule (40 mg total) by mouth daily.     gabapentin 300 MG capsule  Commonly known as:  NEURONTIN  Take 300 mg by  mouth at bedtime as needed (restless leg).     HYDROcodone-acetaminophen 5-325 MG per tablet  Commonly known as:  NORCO/VICODIN  Take 1-2 tablets by mouth every 8 (eight) hours as needed for moderate pain. Do not fill until 03/15/2014     lisinopril 5 MG tablet  Commonly known as:  PRINIVIL,ZESTRIL  Take 1 tablet (5 mg total) by mouth daily.     metoprolol tartrate 25 MG tablet  Commonly known as:  LOPRESSOR  Take 1 tablet (25 mg total) by mouth 2 (two) times daily.     nitroGLYCERIN 0.4 MG SL tablet  Commonly known as:  NITROSTAT  Place 0.4 mg under the tongue every 5 (five) minutes as needed for chest pain.     ticagrelor 90 MG Tabs tablet  Commonly known as:  BRILINTA  Take 1 tablet (90 mg total) by mouth 2 (two) times daily.     traMADol 50 MG tablet  Commonly known as:  ULTRAM  Take 1 tablet (50 mg total) by mouth every 8 (eight) hours as needed for moderate pain (alternate with the hydrocodone). Do not fill until 04/15/2014        Disposition   The patient will be discharged in stable condition to home.  Follow-up Information   Follow up with Peter Martinique, MD. (The office will call you to make an appt in 1 week. If you do not hear from them by Monday please contact them. )    Specialty:  Cardiology   Contact information:   [address] [place] [address] [phone]         Duration of Discharge Encounter: Greater than 30 minutes including physician and PA time.  SignedAngelena Form R PA-C 04/28/2014, 9:09 AM  I have examined the patient and reviewed assessment and plan and discussed with patient.  Agree with above as stated. DAPT for at least a year.  Cardiac rehab to walk. Discharge later today. RF modification.   VARANASI,JAYADEEP S.

## 2014-04-28 NOTE — Telephone Encounter (Signed)
Pt would like some samples of Brilanta  90 mg please.Just discharged from the hospital today.Would like them today if possible. Please call asap.

## 2014-04-28 NOTE — Progress Notes (Signed)
CARDIAC REHAB PHASE I   PRE:  Rate/Rhythm: 80 SR  BP:  Supine: 152/107  Sitting:   Standing:    SaO2:   MODE:  Ambulation: 800 ft   POST:  Rate/Rhythm: 91 SR  BP:  Supine: 152/102 manually  Sitting:   Standing:    SaO2:  0845-1000 Pt walked 800 ft on RA with steady gait. No CP. Education completed with pt and his significant other. Understanding voiced. Gave brilinta packet. BP elevated and PA aware. Encouraged pt to follow heart healthy diet and to watch sodium. Discussed CRP 2 and pt gave permission to refer to [place]. Pt has had very stressful past and still trying to deal with this. Encouraged pt to seek counselling. He was visibly anxious when talking about past. Stated he has been advised to see counselor before. Emotional support given.   Graylon Good, RN BSN  04/28/2014 9:56 AM

## 2014-04-28 NOTE — Telephone Encounter (Signed)
Patient notified samples are available for pick up today

## 2014-04-28 NOTE — Discharge Instructions (Signed)

## 2014-04-28 NOTE — Progress Notes (Signed)
   SUBJECTIVE:  *occasional shortlived stabbing pains  OBJECTIVE:   Vitals:   Filed Vitals:   04/27/14 2100 04/28/14 0002 04/28/14 0034 04/28/14 0401  BP: 141/94 148/101 133/103 139/99  Pulse: 64 60 62 74  Temp:  97.5 F (36.4 C)  97.9 F (36.6 C)  TempSrc:  Oral  Oral  Resp:  18  18  Height:      Weight:  162 lb 7.7 oz (73.7 kg)    SpO2: 98% 99% 99% 99%   I&O's:   Intake/Output Summary (Last 24 hours) at 04/28/14 8921 Last data filed at 04/28/14 0413  Gross per 24 hour  Intake 1415.8 ml  Output   1600 ml  Net -184.2 ml   TELEMETRY: Reviewed telemetry pt in NSR:     PHYSICAL EXAM General: Well developed, well nourished, in no acute distress Head:   Normal cephalic and atramatic  Lungs:   Clear bilaterally to auscultation. Heart:   HRRR S1 S2  No JVD.   Abdomen: abdomen soft and non-tender Msk:  Back normal,  Normal strength and tone for age. Extremities:   No edema.  No wrist hematoma Neuro: Alert and oriented. Psych:  Normal affect, responds appropriately   LABS: Basic Metabolic Panel:  Recent Labs  04/27/14 1238 04/27/14 1246 04/28/14 0447  NA 140 139 140  K 4.3 3.9 4.4  CL 101 101 104  CO2 25  --  23  GLUCOSE 85 85 111*  BUN 13 12 12   CREATININE 0.87 1.00 0.96  CALCIUM 9.0  --  9.1   Liver Function Tests: No results found for this basename: AST, ALT, ALKPHOS, BILITOT, PROT, ALBUMIN,  in the last 72 hours No results found for this basename: LIPASE, AMYLASE,  in the last 72 hours CBC:  Recent Labs  04/27/14 1238 04/27/14 1246 04/28/14 0447  WBC 8.9  --  9.4  HGB 14.6 14.6 14.9  HCT 42.0 43.0 43.3  MCV 90.9  --  91.7  PLT 209  --  198   Cardiac Enzymes: No results found for this basename: CKTOTAL, CKMB, CKMBINDEX, TROPONINI,  in the last 72 hours BNP: No components found with this basename: POCBNP,  D-Dimer: No results found for this basename: DDIMER,  in the last 72 hours Hemoglobin A1C: No results found for this basename: HGBA1C,   in the last 72 hours Fasting Lipid Panel: No results found for this basename: CHOL, HDL, LDLCALC, TRIG, CHOLHDL, LDLDIRECT,  in the last 72 hours Thyroid Function Tests: No results found for this basename: TSH, T4TOTAL, FREET3, T3FREE, THYROIDAB,  in the last 72 hours Anemia Panel: No results found for this basename: VITAMINB12, FOLATE, FERRITIN, TIBC, IRON, RETICCTPCT,  in the last 72 hours Coag Panel:   Lab Results  Component Value Date   INR 1.06 04/27/2014   INR 0.99 12/24/2011    RADIOLOGY: No results found.    ASSESSMENT: CAD; HTN  PLAN:  Several elevated diastolic BP readings. BP high in teh office as well.  WIll start Lisiniopril 5 mg daily given his CAD.    DAPT for at least a year.    Cardiac rehab to walk.  If patient does well, would consider discharge later today.  RF modification.  Jettie Booze., MD  04/28/2014  7:23 AM

## 2014-04-29 ENCOUNTER — Emergency Department (HOSPITAL_COMMUNITY): Payer: Medicaid Other

## 2014-04-29 ENCOUNTER — Inpatient Hospital Stay (HOSPITAL_COMMUNITY)
Admission: EM | Admit: 2014-04-29 | Discharge: 2014-05-02 | DRG: 287 | Disposition: A | Discharge status: Home / Self Care | Payer: Medicaid Other | Attending: Cardiology | Admitting: Cardiology

## 2014-04-29 ENCOUNTER — Telehealth: Payer: Self-pay | Admitting: Physician Assistant

## 2014-04-29 ENCOUNTER — Encounter (HOSPITAL_COMMUNITY): Payer: Self-pay | Admitting: Emergency Medicine

## 2014-04-29 DIAGNOSIS — Z833 Family history of diabetes mellitus: Secondary | ICD-10-CM

## 2014-04-29 DIAGNOSIS — I200 Unstable angina: Secondary | ICD-10-CM

## 2014-04-29 DIAGNOSIS — I2510 Atherosclerotic heart disease of native coronary artery without angina pectoris: Secondary | ICD-10-CM | POA: Diagnosis present

## 2014-04-29 DIAGNOSIS — Z87442 Personal history of urinary calculi: Secondary | ICD-10-CM

## 2014-04-29 DIAGNOSIS — I959 Hypotension, unspecified: Principal | ICD-10-CM | POA: Diagnosis present

## 2014-04-29 DIAGNOSIS — Z7902 Long term (current) use of antithrombotics/antiplatelets: Secondary | ICD-10-CM

## 2014-04-29 DIAGNOSIS — I25119 Atherosclerotic heart disease of native coronary artery with unspecified angina pectoris: Secondary | ICD-10-CM

## 2014-04-29 DIAGNOSIS — R079 Chest pain, unspecified: Secondary | ICD-10-CM

## 2014-04-29 DIAGNOSIS — F3289 Other specified depressive episodes: Secondary | ICD-10-CM | POA: Diagnosis present

## 2014-04-29 DIAGNOSIS — Z7982 Long term (current) use of aspirin: Secondary | ICD-10-CM

## 2014-04-29 DIAGNOSIS — K219 Gastro-esophageal reflux disease without esophagitis: Secondary | ICD-10-CM | POA: Diagnosis present

## 2014-04-29 DIAGNOSIS — Z888 Allergy status to other drugs, medicaments and biological substances status: Secondary | ICD-10-CM

## 2014-04-29 DIAGNOSIS — Z886 Allergy status to analgesic agent status: Secondary | ICD-10-CM

## 2014-04-29 DIAGNOSIS — Z79899 Other long term (current) drug therapy: Secondary | ICD-10-CM

## 2014-04-29 DIAGNOSIS — F329 Major depressive disorder, single episode, unspecified: Secondary | ICD-10-CM | POA: Diagnosis present

## 2014-04-29 DIAGNOSIS — M1710 Unilateral primary osteoarthritis, unspecified knee: Secondary | ICD-10-CM | POA: Diagnosis present

## 2014-04-29 DIAGNOSIS — Z800 Family history of malignant neoplasm of digestive organs: Secondary | ICD-10-CM

## 2014-04-29 DIAGNOSIS — F411 Generalized anxiety disorder: Secondary | ICD-10-CM | POA: Diagnosis present

## 2014-04-29 DIAGNOSIS — I10 Essential (primary) hypertension: Secondary | ICD-10-CM | POA: Diagnosis present

## 2014-04-29 DIAGNOSIS — R748 Abnormal levels of other serum enzymes: Secondary | ICD-10-CM | POA: Diagnosis present

## 2014-04-29 DIAGNOSIS — R0789 Other chest pain: Secondary | ICD-10-CM | POA: Diagnosis present

## 2014-04-29 DIAGNOSIS — E785 Hyperlipidemia, unspecified: Secondary | ICD-10-CM | POA: Diagnosis present

## 2014-04-29 DIAGNOSIS — Z9861 Coronary angioplasty status: Secondary | ICD-10-CM

## 2014-04-29 DIAGNOSIS — G4730 Sleep apnea, unspecified: Secondary | ICD-10-CM | POA: Diagnosis present

## 2014-04-29 LAB — I-STAT TROPONIN, ED: Troponin i, poc: 1.23 ng/mL (ref 0.00–0.08)

## 2014-04-29 LAB — BASIC METABOLIC PANEL
Anion gap: 15 (ref 5–15)
BUN: 19 mg/dL (ref 6–23)
CO2: 19 meq/L (ref 19–32)
CREATININE: 1.06 mg/dL (ref 0.50–1.35)
Calcium: 9.1 mg/dL (ref 8.4–10.5)
Chloride: 100 mEq/L (ref 96–112)
GFR calc Af Amer: >90 mL/min (ref >90)
GFR calc non Af Amer: 81 mL/min — ABNORMAL LOW (ref >90)
GLUCOSE: 103 mg/dL — AB (ref 70–99)
Potassium: 4.8 mEq/L (ref 3.7–5.3)
Sodium: 134 mEq/L — ABNORMAL LOW (ref 137–147)

## 2014-04-29 LAB — TROPONIN I
TROPONIN I: 1.9 ng/mL — AB (ref <0.30)
Troponin I: 1.77 ng/mL (ref <0.30)

## 2014-04-29 LAB — CBC
HEMATOCRIT: 39.9 % (ref 39.0–52.0)
Hemoglobin: 13.8 g/dL (ref 13.0–17.0)
MCH: 30.9 pg (ref 26.0–34.0)
MCHC: 34.6 g/dL (ref 30.0–36.0)
MCV: 89.3 fL (ref 78.0–100.0)
Platelets: 231 10*3/uL (ref 150–400)
RBC: 4.47 MIL/uL (ref 4.22–5.81)
RDW: 13.1 % (ref 11.5–15.5)
WBC: 12.7 10*3/uL — ABNORMAL HIGH (ref 4.0–10.5)

## 2014-04-29 MED ORDER — METOPROLOL TARTRATE 25 MG PO TABS
25.0000 mg | ORAL_TABLET | Freq: Two times a day (BID) | ORAL | Status: DC
  Administered 2014-04-29 – 2014-05-02 (×6): 25 mg via ORAL
  Filled 2014-04-29 (×8): qty 1

## 2014-04-29 MED ORDER — FENTANYL CITRATE 0.05 MG/ML IJ SOLN
25.0000 ug | Freq: Once | INTRAMUSCULAR | Status: AC
  Administered 2014-04-29: 25 ug via INTRAVENOUS
  Filled 2014-04-29: qty 2

## 2014-04-29 MED ORDER — FENTANYL CITRATE 0.05 MG/ML IJ SOLN
50.0000 ug | Freq: Once | INTRAMUSCULAR | Status: AC
  Administered 2014-04-29: 50 ug via INTRAVENOUS
  Filled 2014-04-29: qty 2

## 2014-04-29 MED ORDER — HYDROCODONE-ACETAMINOPHEN 5-325 MG PO TABS
1.0000 | ORAL_TABLET | Freq: Three times a day (TID) | ORAL | Status: DC | PRN
  Administered 2014-04-29 – 2014-04-30 (×3): 2 via ORAL
  Administered 2014-05-01: 1 via ORAL
  Administered 2014-05-01 – 2014-05-02 (×2): 2 via ORAL
  Filled 2014-04-29 (×2): qty 2
  Filled 2014-04-29: qty 1
  Filled 2014-04-29 (×3): qty 2

## 2014-04-29 MED ORDER — NITROGLYCERIN 0.4 MG SL SUBL
SUBLINGUAL_TABLET | SUBLINGUAL | Status: AC
  Administered 2014-04-29: 0.4 mg via SUBLINGUAL
  Filled 2014-04-29: qty 3

## 2014-04-29 MED ORDER — TRAMADOL HCL 50 MG PO TABS
50.0000 mg | ORAL_TABLET | Freq: Three times a day (TID) | ORAL | Status: DC | PRN
  Administered 2014-05-01: 50 mg via ORAL
  Filled 2014-04-29: qty 1

## 2014-04-29 MED ORDER — GABAPENTIN 300 MG PO CAPS
300.0000 mg | ORAL_CAPSULE | Freq: Every evening | ORAL | Status: DC | PRN
  Filled 2014-04-29: qty 1

## 2014-04-29 MED ORDER — PANTOPRAZOLE SODIUM 40 MG PO TBEC
40.0000 mg | DELAYED_RELEASE_TABLET | Freq: Every day | ORAL | Status: DC
  Administered 2014-04-29 – 2014-05-02 (×4): 40 mg via ORAL
  Filled 2014-04-29 (×4): qty 1

## 2014-04-29 MED ORDER — HEPARIN (PORCINE) IN NACL 100-0.45 UNIT/ML-% IJ SOLN
1200.0000 [IU]/h | INTRAMUSCULAR | Status: DC
  Administered 2014-04-29: 1000 [IU]/h via INTRAVENOUS
  Administered 2014-04-30 (×2): 1300 [IU]/h via INTRAVENOUS
  Filled 2014-04-29 (×3): qty 250

## 2014-04-29 MED ORDER — NITROGLYCERIN 0.4 MG SL SUBL
0.4000 mg | SUBLINGUAL_TABLET | SUBLINGUAL | Status: DC | PRN
  Administered 2014-04-29: 0.4 mg via SUBLINGUAL

## 2014-04-29 MED ORDER — HEPARIN SODIUM (PORCINE) 5000 UNIT/ML IJ SOLN
4000.0000 [IU] | Freq: Once | INTRAMUSCULAR | Status: AC
  Administered 2014-04-29: 4000 [IU] via INTRAVENOUS
  Filled 2014-04-29: qty 1

## 2014-04-29 MED ORDER — ONDANSETRON HCL 4 MG/2ML IJ SOLN: 4.0000 mg | Freq: Four times a day (QID) | INTRAMUSCULAR | Status: DC | PRN

## 2014-04-29 MED ORDER — TICAGRELOR 90 MG PO TABS
90.0000 mg | ORAL_TABLET | Freq: Two times a day (BID) | ORAL | Status: DC
  Administered 2014-04-29 – 2014-05-02 (×6): 90 mg via ORAL
  Filled 2014-04-29 (×8): qty 1

## 2014-04-29 MED ORDER — ATORVASTATIN CALCIUM 80 MG PO TABS
80.0000 mg | ORAL_TABLET | Freq: Every day | ORAL | Status: DC
  Administered 2014-04-29 – 2014-05-01 (×3): 80 mg via ORAL
  Filled 2014-04-29 (×5): qty 1

## 2014-04-29 MED ORDER — ASPIRIN 81 MG PO CHEW
81.0000 mg | CHEWABLE_TABLET | Freq: Every day | ORAL | Status: DC
  Administered 2014-04-30 – 2014-05-02 (×3): 81 mg via ORAL
  Filled 2014-04-29 (×3): qty 1

## 2014-04-29 NOTE — ED Notes (Signed)
Pt remains monitored by blood pressure, pulse ox, and 5 lead. Pts family remains at bedside.  

## 2014-04-29 NOTE — Telephone Encounter (Signed)
Patient called weekend answering service on Saturday reporting SBP 84/60 about 15 minutes ago. Felt some chest pressure and lightheadedness with this. Chest pain feels similar to what he had before, just less severe. Now "heart feels irritated." SBP was running 140s in the hospital. He has not rechecked his BP. Due to the nature of this call it is difficult to fully reassure him over the phone without being able to do an EKG. It may be that his meds need adjusting (ie drop lisinopril), but with the additional note of chest pressure I just want to make sure there are no post-stent complications. Therefore I advised him to proceed to the ED for evaluation. He verbalized understanding of plan. Ashante Yellin PA-C

## 2014-04-29 NOTE — ED Notes (Signed)
Wife reports that the pt was put back on lisinopril and in the past has had problems with that causing his BP to drop. Dr. Mingo Amber at bedside.

## 2014-04-29 NOTE — ED Notes (Signed)
Cardiology at bedside.

## 2014-04-29 NOTE — ED Provider Notes (Signed)
CSN: [id number]     Arrival date & time 04/29/14  1456 History   First MD Initiated Contact with Patient 04/29/14 1536     Chief Complaint  Patient presents with  . Chest Pain     (Consider location/radiation/quality/duration/timing/severity/associated sxs/prior Treatment) Patient is a 48 y.o. male presenting with chest pain. The history is provided by the patient.  Chest Pain Pain location:  L chest Pain quality: dull and pressure   Pain radiates to:  Upper back Pain radiates to the back: yes   Pain severity:  Mild Onset quality:  Sudden Timing:  Intermittent (alternating between L anterior chest and L lateral chest) Progression:  Unchanged Chronicity:  New Context: not breathing and no drug use   Relieved by:  Nothing Worsened by:  Nothing tried Associated symptoms: no abdominal pain, no cough, no fever, no shortness of breath and not vomiting   Risk factors: coronary artery disease   Risk factors: no high cholesterol and no hypertension     Past Medical History  Diagnosis Date  . Barrett's esophagus     egd - 11/27/09 - short segment of Barrett's  . Hyperlipidemia   . Anxiety   . Low back pain   . GERD (gastroesophageal reflux disease)   . Hiatal hernia     egd - 11/27/2009  . Depression   . Adrenal tumor   . Hypertension   . Sleep apnea   . Degenerative joint disease     Bilateral knees. Significant knee pain since playing football in high school.   Joaquim Lai, kidney   . CAD (coronary artery disease)     a. 04/27/14 [place] s/p DES to mLAD and DES to [facility]   Past Surgical History  Procedure Laterality Date  . Knee arthroscopy Right X 6  . Lithotripsy  X 2  . Kidney stone surgery  X 1  . Coronary angioplasty with stent placement  04/27/2014    "2"  . Knee arthroplasty Right 1984  . Adrenalectomy  10/2013   No family history on file. History  Substance Use Topics  . Smoking status: Never Smoker   . Smokeless tobacco: Never Used  . Alcohol Use: 1.2 oz/week    2  Shots of liquor per week    Review of Systems  Constitutional: Negative for fever and chills.  Respiratory: Negative for cough and shortness of breath.   Cardiovascular: Positive for chest pain.  Gastrointestinal: Negative for vomiting and abdominal pain.  All other systems reviewed and are negative.     Allergies  Cortisone acetate; Darvocet; and Nsaids  Home Medications   Prior to Admission medications   Medication Sig Start Date End Date Taking? Authorizing Provider  aspirin 81 MG chewable tablet Chew 1 tablet (81 mg total) by mouth daily. 04/28/14   Eileen Stanford, PA-C  atorvastatin (LIPITOR) 80 MG tablet Take 1 tablet (80 mg total) by mouth daily at 6 PM. 04/28/14   Eileen Stanford, PA-C  esomeprazole (NEXIUM) 40 MG capsule Take 1 capsule (40 mg total) by mouth daily. 11/02/13   Leone Brand, MD  gabapentin (NEURONTIN) 300 MG capsule Take 300 mg by mouth at bedtime as needed (restless leg).     Historical Provider, MD  HYDROcodone-acetaminophen (NORCO/VICODIN) 5-325 MG per tablet Take 1-2 tablets by mouth every 8 (eight) hours as needed for moderate pain. Do not fill until 03/15/2014 03/15/14   Leone Brand, MD  lisinopril (PRINIVIL,ZESTRIL) 5 MG tablet Take 1 tablet (5 mg  total) by mouth daily. 04/28/14   Eileen Stanford, PA-C  metoprolol tartrate (LOPRESSOR) 25 MG tablet Take 1 tablet (25 mg total) by mouth 2 (two) times daily. 04/28/14   Eileen Stanford, PA-C  nitroGLYCERIN (NITROSTAT) 0.4 MG SL tablet Place 1 tablet (0.4 mg total) under the tongue every 5 (five) minutes as needed for chest pain. 04/28/14   Peter M Martinique, MD  ticagrelor (BRILINTA) 90 MG TABS tablet Take 1 tablet (90 mg total) by mouth 2 (two) times daily. 04/28/14   Brittainy Simmons, PA-C  traMADol (ULTRAM) 50 MG tablet Take 1 tablet (50 mg total) by mouth every 8 (eight) hours as needed for moderate pain (alternate with the hydrocodone). Do not fill until 04/15/2014 04/15/14   Leone Brand, MD   BP  89/56  Pulse 81  Temp(Src) 97.6 F (36.4 C) (Oral)  Resp 15  Ht 5\' 7"  (1.702 m)  Wt 162 lb (73.483 kg)  BMI 25.37 kg/m2  SpO2 97% Physical Exam  Nursing note and vitals reviewed. Constitutional: He is oriented to person, place, and time. He appears well-developed and well-nourished. No distress.  HENT:  Head: Normocephalic and atraumatic.  Mouth/Throat: Oropharynx is clear and moist. No oropharyngeal exudate.  Eyes: EOM are normal. Pupils are equal, round, and reactive to light.  Neck: Normal range of motion. Neck supple.  Cardiovascular: Normal rate and regular rhythm.  Exam reveals no friction rub.   No murmur heard. Pulmonary/Chest: Effort normal and breath sounds normal. No respiratory distress. He has no wheezes. He has no rales.  Abdominal: Soft. He exhibits no distension. There is no tenderness. There is no rebound.  Musculoskeletal: Normal range of motion. He exhibits no edema.  Neurological: He is alert and oriented to person, place, and time. No cranial nerve deficit. He exhibits normal muscle tone. Coordination normal.  Skin: Skin is warm. No rash noted. He is not diaphoretic.    ED Course  Procedures (including critical care time) Labs Review Labs Reviewed  CBC - Abnormal; Notable for the following:    WBC 12.7 (*)    All other components within normal limits  BASIC METABOLIC PANEL  Randolm Idol, ED    Imaging Review Dg Chest 2 View  04/29/2014   CLINICAL DATA:  Chest pain .  EXAM: CHEST  2 VIEW  COMPARISON:  03/18/2014, 10/07/2013.  CT chest 01/09/2012.  FINDINGS: Mediastinum and hilar structures are normal. Lungs are clear. Heart size normal. No pleural effusion or pneumothorax. No acute bony abnormality.  IMPRESSION: No active cardiopulmonary disease.   Electronically Signed   By: Marcello Moores  Register   On: 04/29/2014 17:17     EKG Interpretation   Date/Time:  Saturday April 29 2014 15:01:50 EDT Ventricular Rate:  82 PR Interval:  160 QRS Duration:  92 QT Interval:  372 QTC Calculation: 434 R Axis:   87 Text Interpretation:  Normal sinus rhythm Incomplete right bundle branch  block Borderline ECG Similar to prior Confirmed by Mingo Amber  MD, [place]  ([id number]) on 04/29/2014 3:37:51 PM      MDM   Final diagnoses:  Chest pain, unspecified chest pain type    48 year old male here with chest pain. Had stents placed 2 days ago after an abnormal stress test. Has been taking his aspirin by Vaughan Basta. Was restarted on lisinopril which historically is caused low blood pressures. Began having left-sided chest pain at rest this morning alternates between left anterior and left lateral chest described as a dull pressure. No other  radiation. Having it now. Did take his aspirin at home. EKG here similar to prior. Was instructed to come in by cardiology. Patient is hypotensive here 88/55 is his first pressure. Exam is otherwise benign. Will call cardiology for reevaluation. Troponin elevated. Cards admitting.   Evelina Bucy, MD 04/29/14 [phone]

## 2014-04-29 NOTE — ED Notes (Signed)
Pt had cardiac stents placed Tuesday.  Presents today with c/o L side CP described as aching.

## 2014-04-29 NOTE — H&P (Addendum)
Andrew Huerta is an 48 y.o. male.     Chief Complaint: chest pain with PCI done 8/27 HPI: Andrew Huerta is a 48 yo man with PMH of dyslipidemia, GERD, hypertension, CAD with DES to mLAD and mLCx who presents with low blood pressure and chest pain. He called in earlier today with bp of 80s/60s at home with symptoms of an irritated heart and given his challenging symptoms he was advised to come to the ER. In the ER he was started on heparin gtt given his elevated troponins (1.9 --> 1.2).  On my discussion, he tells me he has had significant lowering of his blood pressure with lisinopril before. He also tells me his current pain is similar in character as before the PCI but not as severe, left sided, mid-check, feels in between pressure and like someone hit him.  We discussed importance of notifying RN if symptoms come back and our plan to hold lisinopril, watch on the monitor, repeat ECG and see how he does overnight.       Past Medical History  Diagnosis Date  . Barrett's esophagus     egd - 11/27/09 - short segment of Barrett's  . Hyperlipidemia   . Anxiety   . Low back pain   . GERD (gastroesophageal reflux disease)   . Hiatal hernia     egd - 11/27/2009  . Depression   . Adrenal tumor     a. s/p adrenal gland resection at [facility].  Marland Kitchen Hypertension   . Sleep apnea   . Degenerative joint disease     Bilateral knees. Significant knee pain since playing football in high school.   Andrew Huerta, kidney   . CAD (coronary artery disease)     a. 04/27/14 [place] s/p DES to mLAD and DES to [facility]    Past Surgical History  Procedure Laterality Date  . Knee arthroscopy Right X 6  . Lithotripsy  X 2  . Kidney stone surgery  X 1  . Coronary angioplasty with stent placement  04/27/2014    "2"  . Knee arthroplasty Right 1984  . Adrenalectomy  10/2013    Family History  Problem Relation Age of Onset  . Diabetes    . Stomach cancer     Social History:  reports that he has never smoked. He has never used  smokeless tobacco. He reports that he drinks about 1.2 ounces of alcohol per week. He reports that he does not use illicit drugs.  Allergies:  Allergies  Allergen Reactions  . Cortisone Acetate Swelling  . Darvocet [Propoxyphene N-Acetaminophen] Nausea And Vomiting  . Nsaids Other (See Comments)    Caused stomach ulcers in the past      (Not in a hospital admission)  Results for orders placed during the hospital encounter of 04/29/14 (from the past 48 hour(s))  CBC     Status: Abnormal   Collection Time    04/29/14  3:15 PM      Result Value Ref Range   WBC 12.7 (*) 4.0 - 10.5 K/uL   RBC 4.47  4.22 - 5.81 MIL/uL   Hemoglobin 13.8  13.0 - 17.0 g/dL   HCT 39.9  39.0 - 52.0 %   MCV 89.3  78.0 - 100.0 fL   MCH 30.9  26.0 - 34.0 pg   MCHC 34.6  30.0 - 36.0 g/dL   RDW 13.1  11.5 - 15.5 %   Platelets 231  150 - 400 K/uL  BASIC METABOLIC PANEL  Status: Abnormal   Collection Time    04/29/14  3:15 PM      Result Value Ref Range   Sodium 134 (*) 137 - 147 mEq/L   Potassium 4.8  3.7 - 5.3 mEq/L   Comment: HEMOLYSIS AT THIS LEVEL MAY AFFECT RESULT   Chloride 100  96 - 112 mEq/L   CO2 19  19 - 32 mEq/L   Glucose, Bld 103 (*) 70 - 99 mg/dL   BUN 19  6 - 23 mg/dL   Creatinine, Ser 1.06  0.50 - 1.35 mg/dL   Calcium 9.1  8.4 - 10.5 mg/dL   GFR calc non Af Amer 81 (*) >90 mL/min   GFR calc Af Amer >90  >90 mL/min   Comment: (NOTE)     The eGFR has been calculated using the CKD EPI equation.     This calculation has not been validated in all clinical situations.     eGFR's persistently <90 mL/min signify possible Chronic Kidney     Disease.   Anion gap 15  5 - 15  TROPONIN I     Status: Abnormal   Collection Time    04/29/14  3:32 PM      Result Value Ref Range   Troponin I 1.90 (*) <0.30 ng/mL   Comment:            Due to the release kinetics of cTnI,     a negative result within the first hours     of the onset of symptoms does not rule out     myocardial infarction with  certainty.     If myocardial infarction is still suspected,     repeat the test at appropriate intervals.     CRITICAL RESULT CALLED TO, READ BACK BY AND VERIFIED WITH:     B.MILLER,RN 1832 04/29/14 M.[place], ED     Status: Abnormal   Collection Time    04/29/14  5:37 PM      Result Value Ref Range   Troponin i, poc 1.23 (*) 0.00 - 0.08 ng/mL   Comment NOTIFIED PHYSICIAN     Comment 3            Comment: Due to the release kinetics of cTnI,     a negative result within the first hours     of the onset of symptoms does not rule out     myocardial infarction with certainty.     If myocardial infarction is still suspected,     repeat the test at appropriate intervals.   Dg Chest 2 View  04/29/2014   CLINICAL DATA:  Chest pain .  EXAM: CHEST  2 VIEW  COMPARISON:  03/18/2014, 10/07/2013.  CT chest 01/09/2012.  FINDINGS: Mediastinum and hilar structures are normal. Lungs are clear. Heart size normal. No pleural effusion or pneumothorax. No acute bony abnormality.  IMPRESSION: No active cardiopulmonary disease.   Electronically Signed   By: Marcello Moores  Register   On: 04/29/2014 17:17    Review of Systems  Constitutional: Positive for malaise/fatigue. Negative for fever and chills.  HENT: Negative for ear pain.   Eyes: Negative for double vision and photophobia.  Respiratory: Negative for hemoptysis and shortness of breath.   Cardiovascular: Positive for chest pain. Negative for orthopnea.  Gastrointestinal: Negative for nausea, vomiting and abdominal pain.  Genitourinary: Negative for dysuria and hematuria.  Musculoskeletal: Negative for falls and myalgias.  Skin: Negative for rash.  Neurological: Positive for dizziness. Negative  for tingling, tremors and headaches.  Endo/Heme/Allergies: Negative for polydipsia.  Psychiatric/Behavioral: Negative for depression, suicidal ideas and substance abuse.    Blood pressure 109/80, pulse 79, temperature 97.6 F (36.4 C),  temperature source Oral, resp. rate 16, height _0  (1.702 m), weight 73.483 kg (162 lb), SpO2 99.00%. Physical Exam  Nursing note and vitals reviewed. Constitutional: He is oriented to person, place, and time. He appears well-developed and well-nourished. No distress.  HENT:  Head: Normocephalic and atraumatic.  Nose: Nose normal.  Mouth/Throat: Oropharynx is clear and moist. No oropharyngeal exudate.  Eyes: Conjunctivae and EOM are normal. Pupils are equal, round, and reactive to light. No scleral icterus.  Neck: Normal range of motion. Neck supple. No JVD present. No tracheal deviation present.  Cardiovascular: Normal rate, regular rhythm, normal heart sounds and intact distal pulses.  Exam reveals no friction rub.   No murmur heard. Respiratory: Effort normal and breath sounds normal. No respiratory distress. He has no wheezes. He has no rales.  GI: Soft. Bowel sounds are normal. He exhibits no distension. There is no tenderness. There is no rebound.  Musculoskeletal: Normal range of motion. He exhibits no edema.  Neurological: He is alert and oriented to person, place, and time. No cranial nerve deficit. Coordination normal.  Skin: Skin is warm and dry. No rash noted. He is not diaphoretic. No erythema.  Psychiatric: He has a normal mood and affect. His behavior is normal. Thought content normal.    Labs reviewed; na 134, K 4.9, bun/cr 19/1.06, wbc 12.7, trop 1.9 --> 1.2 Chest x-ray unrevealing ECG reviewed; SR, HR 82, Incomplete RBBB pattern   Assessment/Plan Andrew Huerta is a 48 yo man with HTN, HLD, CAD and recent PCI of LAD/LCx 8/27 who represents with hypotension and chest pain after PCI 8/27. He has responded with lowish blood pressure to lisinopril before so this may be the case. He may have had some mild cold like symptoms in the last 24 hours as well.  His chest pain is not as severe and may be related to lower blood pressures. Instent thrombosis less likely with fairly normal  ECG. Will watch overnight, trend troponins, watch telemetry, continue on heparin given start in ER but if he is asymptomatic overnight may be able to discharge home tomorrow.  If symptoms recur, may have to consider relook cath. Discussed plan with Andrew Huerta and his fiance.   1. Chest pain: as above, telemetry, trend cardiac biomarkers, continue aspirin 81 mg, ticagrelor 90 mg bid, atorvastatin, metoprolol  2. Hypotension: hold lisinopril - blood pressure current 100s/50-60s 3. Dyslipidemia: continue atorvastatin 4. CAD: as per chest pain above 5. GERD: continue PPI (pantoprazole)   Sharmeka Palmisano 04/29/2014, 7:07 PM

## 2014-04-29 NOTE — ED Notes (Signed)
Pt remains monitored by blood pressure, pulse ox, and 5 lead.  

## 2014-04-29 NOTE — Progress Notes (Signed)
ANTICOAGULATION CONSULT NOTE - Initial Consult  Pharmacy Consult for Heparin Indication: chest pain/ACS  Allergies  Allergen Reactions  . Cortisone Acetate Swelling  . Darvocet [Propoxyphene N-Acetaminophen] Nausea And Vomiting  . Nsaids Other (See Comments)    Caused stomach ulcers in the past     Patient Measurements: Height: 5\' 7"  (170.2 cm) Weight: 162 lb (73.483 kg) IBW/kg (Calculated) : 66.1 Heparin Dosing Weight: 73 kg  Vital Signs: Temp: 97.6 F (36.4 C) (08/29 1509) Temp src: Oral (08/29 1509) BP: 103/72 mmHg (08/29 1816) Pulse Rate: 76 (08/29 1816)  Labs:  Recent Labs  04/27/14 1238 04/27/14 1246 04/28/14 0447 04/29/14 1515  HGB 14.6 14.6 14.9 13.8  HCT 42.0 43.0 43.3 39.9  PLT 209  --  198 231  APTT 34  --   --   --   LABPROT 13.8  --   --   --   INR 1.06  --   --   --   CREATININE 0.87 1.00 0.96 1.06    Estimated Creatinine Clearance: 79.7 ml/min (by C-G formula based on Cr of 1.06).   Medical History: Past Medical History  Diagnosis Date  . Barrett's esophagus     egd - 11/27/09 - short segment of Barrett's  . Hyperlipidemia   . Anxiety   . Low back pain   . GERD (gastroesophageal reflux disease)   . Hiatal hernia     egd - 11/27/2009  . Depression   . Adrenal tumor     a. s/p adrenal gland resection at [facility].  Marland Kitchen Hypertension   . Sleep apnea   . Degenerative joint disease     Bilateral knees. Significant knee pain since playing football in high school.   Joaquim Lai, kidney   . CAD (coronary artery disease)     a. 04/27/14 [place] s/p DES to mLAD and DES to mLCx    Medications:   (Not in a hospital admission)  Assessment: 48 yo M admitted 04/29/2014 with CP after recent DES in past week.  Pharmacy consulted to start heparin.  Coag/Heme ACS, troponin 1.23, CBC wnl, 4000 units IV bolus given in ED   Goal of Therapy:  Heparin level 0.3-0.7 units/ml Monitor platelets by anticoagulation protocol: Yes   Plan:  Start heparin infusion at  1000 units/hr Check anti-Xa level in 6 hours and daily while on heparin Continue to monitor H&H and platelets  Thank you for allowing pharmacy to be a part of this patients care team.  Rowe Robert Pharm.D., BCPS, AQ-Cardiology Clinical Pharmacist 04/29/2014 6:20 PM Pager: [phone] Phone: [phone]

## 2014-04-29 NOTE — ED Notes (Signed)
Lab called with critical troponin level of 1.90, EDP aware.

## 2014-04-30 ENCOUNTER — Encounter (HOSPITAL_COMMUNITY): Payer: Self-pay | Admitting: *Deleted

## 2014-04-30 DIAGNOSIS — E785 Hyperlipidemia, unspecified: Secondary | ICD-10-CM

## 2014-04-30 LAB — HEPARIN LEVEL (UNFRACTIONATED)
HEPARIN UNFRACTIONATED: 0.26 [IU]/mL — AB (ref 0.30–0.70)
Heparin Unfractionated: 0.25 IU/mL — ABNORMAL LOW (ref 0.30–0.70)
Heparin Unfractionated: 0.5 IU/mL (ref 0.30–0.70)

## 2014-04-30 LAB — CBC
HEMATOCRIT: 40.6 % (ref 39.0–52.0)
HEMOGLOBIN: 14.4 g/dL (ref 13.0–17.0)
MCH: 31.9 pg (ref 26.0–34.0)
MCHC: 35.5 g/dL (ref 30.0–36.0)
MCV: 89.8 fL (ref 78.0–100.0)
Platelets: 209 10*3/uL (ref 150–400)
RBC: 4.52 MIL/uL (ref 4.22–5.81)
RDW: 13.2 % (ref 11.5–15.5)
WBC: 9.7 10*3/uL (ref 4.0–10.5)

## 2014-04-30 LAB — MRSA PCR SCREENING: MRSA by PCR: NEGATIVE

## 2014-04-30 LAB — TROPONIN I
Troponin I: 1.71 ng/mL (ref <0.30)
Troponin I: 1.19 ng/mL (ref <0.30)

## 2014-04-30 MED ORDER — MORPHINE SULFATE 2 MG/ML IJ SOLN
2.0000 mg | INTRAMUSCULAR | Status: DC | PRN
  Administered 2014-04-30 – 2014-05-01 (×5): 2 mg via INTRAVENOUS
  Filled 2014-04-30 (×5): qty 1

## 2014-04-30 NOTE — Progress Notes (Signed)
SUBJECTIVE:  Had recurrent CP overnight but currently pain free  OBJECTIVE:   Vitals:   Filed Vitals:   04/30/14 0000 04/30/14 0416 04/30/14 0445 04/30/14 0821  BP: 107/75 119/78  119/86  Pulse: 85 63  73  Temp:  97.9 F (36.6 C)  97.1 F (36.2 C)  TempSrc:  Oral  Oral  Resp: 15 9  14   Height:   5\' 7"  (1.702 m)   Weight:      SpO2: 97% 97%  97%   I&O's:   Intake/Output Summary (Last 24 hours) at 04/30/14 1131 Last data filed at 04/30/14 0700  Gross per 24 hour  Intake 122.66 ml  Output    400 ml  Net -277.34 ml   TELEMETRY: Reviewed telemetry pt in NSR:     PHYSICAL EXAM General: Well developed, well nourished, in no acute distress Head: Eyes PERRLA, No xanthomas.   Normal cephalic and atramatic  Lungs:   Clear bilaterally to auscultation and percussion. Heart:   HRRR S1 S2 Pulses are 2+ & equal. Abdomen: Bowel sounds are positive, abdomen soft and non-tender without masses Extremities:   No clubbing, cyanosis or edema.  DP +1 Neuro: Alert and oriented X 3. Psych:  Good affect, responds appropriately   LABS: Basic Metabolic Panel:  Recent Labs  04/28/14 0447 04/29/14 1515  NA 140 134*  K 4.4 4.8  CL 104 100  CO2 23 19  GLUCOSE 111* 103*  BUN 12 19  CREATININE 0.96 1.06  CALCIUM 9.1 9.1   Liver Function Tests: No results found for this basename: AST, ALT, ALKPHOS, BILITOT, PROT, ALBUMIN,  in the last 72 hours No results found for this basename: LIPASE, AMYLASE,  in the last 72 hours CBC:  Recent Labs  04/29/14 1515 04/30/14 1015  WBC 12.7* 9.7  HGB 13.8 14.4  HCT 39.9 40.6  MCV 89.3 89.8  PLT 231 209   Cardiac Enzymes:  Recent Labs  04/29/14 2245 04/30/14 0345 04/30/14 1015  TROPONINI 1.77* 1.71* 1.19*   BNP: No components found with this basename: POCBNP,  D-Dimer: No results found for this basename: DDIMER,  in the last 72 hours Hemoglobin A1C: No results found for this basename: HGBA1C,  in the last 72 hours Fasting Lipid  Panel: No results found for this basename: CHOL, HDL, LDLCALC, TRIG, CHOLHDL, LDLDIRECT,  in the last 72 hours Thyroid Function Tests: No results found for this basename: TSH, T4TOTAL, FREET3, T3FREE, THYROIDAB,  in the last 72 hours Anemia Panel: No results found for this basename: VITAMINB12, FOLATE, FERRITIN, TIBC, IRON, RETICCTPCT,  in the last 72 hours Coag Panel:   Lab Results  Component Value Date   INR 1.06 04/27/2014   INR 0.99 12/24/2011    RADIOLOGY: Dg Chest 2 View  04/29/2014   CLINICAL DATA:  Chest pain .  EXAM: CHEST  2 VIEW  COMPARISON:  03/18/2014, 10/07/2013.  CT chest 01/09/2012.  FINDINGS: Mediastinum and hilar structures are normal. Lungs are clear. Heart size normal. No pleural effusion or pneumothorax. No acute bony abnormality.  IMPRESSION: No active cardiopulmonary disease.   Electronically Signed   By: Marcello Moores  Register   On: 04/29/2014 17:17   Assessment/Plan  Andrew Huerta is a 48 yo man with HTN, HLD, CAD and recent PCI of LAD/LCx 8/27 who represented with hypotension and chest pain after PCI 8/27. He has responded with lowish blood pressure to lisinopril before so this may be the case. He may have had some mild cold like symptoms  in the last 24 hours as well. His chest pain is not as severe and may be related to lower blood pressures. Instent thrombosis less likely with fairly normal ECG. 1. Chest pain: Troponin elevated at 1.9 but trending downward.  Difficult to say whether troponin bump is from NSTEMI or from recent PCI x 2 2 days prior.  EKG is nonischemic.  He had recurrent CP last night similar to his angina prior to PCI but also had some atypical components in that is is worse with deep breathing and cough. - continue aspirin 81 mg, ticagrelor 90 mg bid, atorvastatin, metoprolol  -  Will discuss with interventionalist  2. Hypotension: resolved after stopping lisinopril 3. Dyslipidemia: continue atorvastatin  4. CAD: ASA/statin/Ticagrelor 5. GERD: continue PPI  (pantoprazole)  Sueanne Margarita, MD  04/30/2014  11:31 AM

## 2014-04-30 NOTE — Progress Notes (Addendum)
ANTICOAGULATION CONSULT NOTE - Initial Consult  Pharmacy Consult for Heparin Indication: chest pain/ACS  Allergies  Allergen Reactions  . Cortisone Acetate Swelling  . Darvocet [Propoxyphene N-Acetaminophen] Nausea And Vomiting  . Nsaids Other (See Comments)    Caused stomach ulcers in the past     Patient Measurements: Height: 5\' 7"  (170.2 cm) Weight: 162 lb (73.483 kg) IBW/kg (Calculated) : 66.1 Heparin Dosing Weight: 73 kg  Vital Signs: Temp: 97.1 F (36.2 C) (08/30 0821) Temp src: Oral (08/30 0821) BP: 119/86 mmHg (08/30 0821) Pulse Rate: 73 (08/30 0821)  Labs:  Recent Labs  04/27/14 1238 04/27/14 1246 04/28/14 0447 04/29/14 1515  04/29/14 2245 04/30/14 0046 04/30/14 0345 04/30/14 1015  HGB 14.6 14.6 14.9 13.8  --   --   --   --  14.4  HCT 42.0 43.0 43.3 39.9  --   --   --   --  40.6  PLT 209  --  198 231  --   --   --   --  209  APTT 34  --   --   --   --   --   --   --   --   LABPROT 13.8  --   --   --   --   --   --   --   --   INR 1.06  --   --   --   --   --   --   --   --   HEPARINUNFRC  --   --   --   --   --   --  0.26*  --  0.25*  CREATININE 0.87 1.00 0.96 1.06  --   --   --   --   --   TROPONINI  --   --   --   --   < > 1.77*  --  1.71* 1.19*  < > = values in this interval not displayed.  Estimated Creatinine Clearance: 79.7 ml/min (by C-G formula based on Cr of 1.06).   Medical History: Past Medical History  Diagnosis Date  . Barrett's esophagus     egd - 11/27/09 - short segment of Barrett's  . Hyperlipidemia   . Anxiety   . Low back pain   . GERD (gastroesophageal reflux disease)   . Hiatal hernia     egd - 11/27/2009  . Depression   . Adrenal tumor     a. s/p adrenal gland resection at [facility].  Marland Kitchen Hypertension   . Sleep apnea   . Degenerative joint disease     Bilateral knees. Significant knee pain since playing football in high school.   Joaquim Lai, kidney   . CAD (coronary artery disease)     a. 04/27/14 [place] s/p DES to mLAD and DES to  mLCx    Medications:  Prescriptions prior to admission  Medication Sig Dispense Refill  . aspirin 81 MG chewable tablet Chew 1 tablet (81 mg total) by mouth daily.      Marland Kitchen atorvastatin (LIPITOR) 80 MG tablet Take 1 tablet (80 mg total) by mouth daily at 6 PM.  30 tablet  11  . esomeprazole (NEXIUM) 40 MG capsule Take 1 capsule (40 mg total) by mouth daily.  30 capsule  11  . gabapentin (NEURONTIN) 300 MG capsule Take 300 mg by mouth at bedtime as needed (restless leg).       Marland Kitchen HYDROcodone-acetaminophen (NORCO/VICODIN) 5-325 MG per tablet Take 1-2 tablets  by mouth every 8 (eight) hours as needed for moderate pain. Do not fill until 03/15/2014  90 tablet  0  . lisinopril (PRINIVIL,ZESTRIL) 5 MG tablet Take 1 tablet (5 mg total) by mouth daily.  30 tablet  11  . metoprolol tartrate (LOPRESSOR) 25 MG tablet Take 1 tablet (25 mg total) by mouth 2 (two) times daily.  60 tablet  11  . nitroGLYCERIN (NITROSTAT) 0.4 MG SL tablet Place 1 tablet (0.4 mg total) under the tongue every 5 (five) minutes as needed for chest pain.  25 tablet  3  . ticagrelor (BRILINTA) 90 MG TABS tablet Take 1 tablet (90 mg total) by mouth 2 (two) times daily.  48 tablet  0  . traMADol (ULTRAM) 50 MG tablet Take 1 tablet (50 mg total) by mouth every 8 (eight) hours as needed for moderate pain (alternate with the hydrocodone). Do not fill until 04/15/2014  30 tablet  0    Assessment: 48 yo M admitted 04/29/2014 with CP after recent DES in past week.  Pharmacy consulted to start heparin. HL now remains SUBtherapeutic at 0.25 with no change after increased rate to 1100 u/hr. CBC remains wnl and stable. Per nurse, no interruptions in gtt and no s/s bleeding.    Goal of Therapy:  Heparin level 0.3-0.7 units/ml Monitor platelets by anticoagulation protocol: Yes   Plan:  - Increase heparin gtt to 1300 u/hr - HL in 6 hrs - Daily HL, CBC, s/s bleeding  Thank you for allowing pharmacy to be a part of this patients care team. Harolyn Rutherford, PharmD Clinical Pharmacist - Resident Pager: [phone] Pharmacy: [phone] 04/30/2014 11:23 AM    ======================================   Addendum: - HL = 0.5 - no bleeding reported   Plan: - Continue heparin gtt at 1300 units/hr - F/U AM labs    Aide Wojnar D. Mina Marble, PharmD, BCPS Pager:  [phone] 04/30/2014, 5:52 PM

## 2014-04-30 NOTE — Progress Notes (Signed)
Pt c/o of left arm tingling and chest pain; pt took 1 sl NTG; stated it didn't help his chest pain and gave him a headache; EKG normal; administered Norco 2 tablets; will continue to assess

## 2014-04-30 NOTE — Progress Notes (Signed)
Utilization Review Completed.   Burnis Halling, RN, BSN Nurse Case Manager  

## 2014-04-30 NOTE — Progress Notes (Signed)
ANTICOAGULATION CONSULT NOTE - Follow Up Consult  Pharmacy Consult for heparin Indication: chest pain/ACS  Labs:  Recent Labs  04/27/14 1238 04/27/14 1246 04/28/14 0447 04/29/14 1515 04/29/14 1532 04/29/14 2245 04/30/14 0046  HGB 14.6 14.6 14.9 13.8  --   --   --   HCT 42.0 43.0 43.3 39.9  --   --   --   PLT 209  --  198 231  --   --   --   APTT 34  --   --   --   --   --   --   LABPROT 13.8  --   --   --   --   --   --   INR 1.06  --   --   --   --   --   --   HEPARINUNFRC  --   --   --   --   --   --  0.26*  CREATININE 0.87 1.00 0.96 1.06  --   --   --   TROPONINI  --   --   --   --  1.90* 1.77*  --     Assessment: 48yo male slightly subtherapeutic on heparin with initial dosing for CP.  Goal of Therapy:  Heparin level 0.3-0.7 units/ml   Plan:  Will increase heparin gtt by 10% to 1100 units/hr and check level with next troponin.  Wynona Neat, PharmD, BCPS  04/30/2014,1:22 AM

## 2014-05-01 ENCOUNTER — Encounter (HOSPITAL_COMMUNITY)
Admission: EM | Disposition: A | Discharge status: Home / Self Care | Payer: Self-pay | Source: Home / Self Care | Attending: Cardiology

## 2014-05-01 DIAGNOSIS — R079 Chest pain, unspecified: Secondary | ICD-10-CM

## 2014-05-01 DIAGNOSIS — Z886 Allergy status to analgesic agent status: Secondary | ICD-10-CM | POA: Diagnosis not present

## 2014-05-01 DIAGNOSIS — I959 Hypotension, unspecified: Secondary | ICD-10-CM | POA: Diagnosis present

## 2014-05-01 DIAGNOSIS — Z7902 Long term (current) use of antithrombotics/antiplatelets: Secondary | ICD-10-CM | POA: Diagnosis not present

## 2014-05-01 DIAGNOSIS — F3289 Other specified depressive episodes: Secondary | ICD-10-CM | POA: Diagnosis present

## 2014-05-01 DIAGNOSIS — K219 Gastro-esophageal reflux disease without esophagitis: Secondary | ICD-10-CM | POA: Diagnosis present

## 2014-05-01 DIAGNOSIS — Z888 Allergy status to other drugs, medicaments and biological substances status: Secondary | ICD-10-CM | POA: Diagnosis not present

## 2014-05-01 DIAGNOSIS — F329 Major depressive disorder, single episode, unspecified: Secondary | ICD-10-CM | POA: Diagnosis present

## 2014-05-01 DIAGNOSIS — Z87442 Personal history of urinary calculi: Secondary | ICD-10-CM | POA: Diagnosis not present

## 2014-05-01 DIAGNOSIS — E785 Hyperlipidemia, unspecified: Secondary | ICD-10-CM | POA: Diagnosis present

## 2014-05-01 DIAGNOSIS — Z9861 Coronary angioplasty status: Secondary | ICD-10-CM | POA: Diagnosis not present

## 2014-05-01 DIAGNOSIS — I10 Essential (primary) hypertension: Secondary | ICD-10-CM

## 2014-05-01 DIAGNOSIS — I2510 Atherosclerotic heart disease of native coronary artery without angina pectoris: Secondary | ICD-10-CM

## 2014-05-01 DIAGNOSIS — F411 Generalized anxiety disorder: Secondary | ICD-10-CM | POA: Diagnosis present

## 2014-05-01 DIAGNOSIS — I200 Unstable angina: Secondary | ICD-10-CM

## 2014-05-01 DIAGNOSIS — R748 Abnormal levels of other serum enzymes: Secondary | ICD-10-CM | POA: Diagnosis present

## 2014-05-01 DIAGNOSIS — Z79899 Other long term (current) drug therapy: Secondary | ICD-10-CM | POA: Diagnosis not present

## 2014-05-01 DIAGNOSIS — G4730 Sleep apnea, unspecified: Secondary | ICD-10-CM | POA: Diagnosis present

## 2014-05-01 DIAGNOSIS — Z7982 Long term (current) use of aspirin: Secondary | ICD-10-CM | POA: Diagnosis not present

## 2014-05-01 DIAGNOSIS — R0789 Other chest pain: Secondary | ICD-10-CM | POA: Diagnosis present

## 2014-05-01 DIAGNOSIS — M1710 Unilateral primary osteoarthritis, unspecified knee: Secondary | ICD-10-CM | POA: Diagnosis present

## 2014-05-01 DIAGNOSIS — Z800 Family history of malignant neoplasm of digestive organs: Secondary | ICD-10-CM | POA: Diagnosis not present

## 2014-05-01 DIAGNOSIS — Z833 Family history of diabetes mellitus: Secondary | ICD-10-CM | POA: Diagnosis not present

## 2014-05-01 HISTORY — PX: LEFT HEART CATHETERIZATION WITH CORONARY ANGIOGRAM: SHX5451

## 2014-05-01 HISTORY — PX: CARDIAC CATHETERIZATION: SHX172

## 2014-05-01 LAB — CBC
HEMATOCRIT: 41.2 % (ref 39.0–52.0)
Hemoglobin: 14.3 g/dL (ref 13.0–17.0)
MCH: 31 pg (ref 26.0–34.0)
MCHC: 34.7 g/dL (ref 30.0–36.0)
MCV: 89.4 fL (ref 78.0–100.0)
Platelets: 213 10*3/uL (ref 150–400)
RBC: 4.61 MIL/uL (ref 4.22–5.81)
RDW: 13 % (ref 11.5–15.5)
WBC: 9.6 10*3/uL (ref 4.0–10.5)

## 2014-05-01 LAB — HEPARIN LEVEL (UNFRACTIONATED)
Heparin Unfractionated: 0.76 IU/mL — ABNORMAL HIGH (ref 0.30–0.70)
Heparin Unfractionated: 0.61 IU/mL (ref 0.30–0.70)

## 2014-05-01 SURGERY — LEFT HEART CATHETERIZATION WITH CORONARY ANGIOGRAM
Anesthesia: LOCAL

## 2014-05-01 MED ORDER — NITROGLYCERIN 1 MG/10 ML FOR IR/CATH LAB
INTRA_ARTERIAL | Status: AC
  Filled 2014-05-01: qty 10

## 2014-05-01 MED ORDER — ISOSORBIDE MONONITRATE ER 30 MG PO TB24
30.0000 mg | ORAL_TABLET | Freq: Every day | ORAL | Status: DC
  Administered 2014-05-01 – 2014-05-02 (×2): 30 mg via ORAL
  Filled 2014-05-01 (×2): qty 1

## 2014-05-01 MED ORDER — FENTANYL CITRATE 0.05 MG/ML IJ SOLN
INTRAMUSCULAR | Status: AC
  Filled 2014-05-01: qty 2

## 2014-05-01 MED ORDER — HEPARIN (PORCINE) IN NACL 2-0.9 UNIT/ML-% IJ SOLN
INTRAMUSCULAR | Status: AC
  Filled 2014-05-01: qty 1000

## 2014-05-01 MED ORDER — SODIUM CHLORIDE 0.9 % IJ SOLN: 3.0000 mL | INTRAMUSCULAR | Status: DC | PRN

## 2014-05-01 MED ORDER — MIDAZOLAM HCL 2 MG/2ML IJ SOLN
INTRAMUSCULAR | Status: AC
  Filled 2014-05-01: qty 2

## 2014-05-01 MED ORDER — SODIUM CHLORIDE 0.9 % IJ SOLN: 3.0000 mL | Freq: Two times a day (BID) | INTRAMUSCULAR | Status: DC

## 2014-05-01 MED ORDER — SODIUM CHLORIDE 0.9 % IV SOLN: INTRAVENOUS | Status: AC

## 2014-05-01 MED ORDER — SODIUM CHLORIDE 0.9 % IV SOLN: INTRAVENOUS | Status: DC

## 2014-05-01 MED ORDER — VERAPAMIL HCL 2.5 MG/ML IV SOLN
INTRAVENOUS | Status: AC
  Filled 2014-05-01: qty 2

## 2014-05-01 MED ORDER — SODIUM CHLORIDE 0.9 % IV SOLN: 250.0000 mL | INTRAVENOUS | Status: DC | PRN

## 2014-05-01 MED ORDER — LIDOCAINE HCL (PF) 1 % IJ SOLN
INTRAMUSCULAR | Status: AC
  Filled 2014-05-01: qty 30

## 2014-05-01 MED ORDER — HEPARIN SODIUM (PORCINE) 1000 UNIT/ML IJ SOLN
INTRAMUSCULAR | Status: AC
  Filled 2014-05-01: qty 1

## 2014-05-01 NOTE — Interval H&P Note (Signed)
History and Physical Interval Note:  05/01/2014 3:33 PM  Andrew Huerta  has presented today for cardiac cath with the diagnosis of CAD, chest pain. The various methods of treatment have been discussed with the patient and family. After consideration of risks, benefits and other options for treatment, the patient has consented to  Procedure(s): LEFT HEART CATHETERIZATION WITH CORONARY ANGIOGRAM (N/A) as a surgical intervention .  The patient's history has been reviewed, patient examined, no change in status, stable for surgery.  I have reviewed the patient's chart and labs.  Questions were answered to the patient's satisfaction.    Cath Lab Visit (complete for each Cath Lab visit)  Clinical Evaluation Leading to the Procedure:   ACS: Yes.    Non-ACS:    Anginal Classification: CCS III  Anti-ischemic medical therapy: Minimal Therapy (1 class of medications)  Non-Invasive Test Results: No non-invasive testing performed  Prior CABG: No previous CABG        Andrew Huerta

## 2014-05-01 NOTE — H&P (View-Only) (Signed)
       Patient Name: Andrew Huerta Date of Encounter: 05/01/2014    SUBJECTIVE: Clinical data has been reviewed. The patient has had intermittent atypical left chest discomfort with radiation to left arm. The symptoms are not as intense but similar to those prior to stenting last week. He is comfortable this morning. Cardiac markers have been elevated but it is difficult to determine if this is post PCI phenomenon or related to current presentation.  TELEMETRY:  Normal sinus rhythm: Filed Vitals:   05/01/14 0012 05/01/14 0413 05/01/14 0414 05/01/14 0817  BP: 118/84 128/84 128/84 121/84  Pulse: 62  65   Temp: 97.3 F (36.3 C)  97.2 F (36.2 C) 97.3 F (36.3 C)  TempSrc: Oral  Oral Oral  Resp: 14 14 13 14   Height:      Weight:      SpO2: 98%  99% 100%    Intake/Output Summary (Last 24 hours) at 05/01/14 0920 Last data filed at 05/01/14 0800  Gross per 24 hour  Intake 1502.83 ml  Output   2650 ml  Net -1147.17 ml   LABS: Basic Metabolic Panel:  Recent Labs  04/29/14 1515  NA 134*  K 4.8  CL 100  CO2 19  GLUCOSE 103*  BUN 19  CREATININE 1.06  CALCIUM 9.1   CBC:  Recent Labs  04/30/14 1015 05/01/14 0255  WBC 9.7 9.6  HGB 14.4 14.3  HCT 40.6 41.2  MCV 89.8 89.4  PLT 209 213   Cardiac Enzymes:  Recent Labs  04/29/14 2245 04/30/14 0345 04/30/14 1015  TROPONINI 1.77* 1.71* 1.19*   Radiology/Studies:  Unremarkable  Physical Exam: Blood pressure 121/84, pulse 65, temperature 97.3 F (36.3 C), temperature source Oral, resp. rate 14, height 5\' 7"  (1.702 m), weight 162 lb (73.483 kg), SpO2 100.00%. Weight change:   Wt Readings from Last 3 Encounters:  04/29/14 162 lb (73.483 kg)  04/29/14 162 lb (73.483 kg)  04/28/14 162 lb 7.7 oz (73.7 kg)    No pericardial friction rub. Lungs are clear Extremities with no edema. Right radial cath site is unremarkable.  ASSESSMENT:  1. Atypical chest pain in a patient with multivessel stent procedure  performed on 04/27/2014 2. Elevated cardiac markers, could represent ACS but are likely residual elevation following PCI 3. Coronary artery disease with diffuse distal circumflex disease and mid LAD beyond stent sites.  Plan:  Coronary angiography to relook at stents and rule out the possibility of progression distal to the stented segments. If cath findings are stable will adjust medical regimen and discharged home later today or in a.m.  Relook cath discussed with the patient and wife. They understand the indication and potential risk. He would prefer having a repeat catheterization from the radial approach rather than femoral. Risk of stroke, death, myocardial infarction, infection, among other complications were discussed in detail with the patient and accepted.  Add long acting nitrate to treat possible vasoconstrictive component.  Andrew Huerta 05/01/2014, 9:20 AM

## 2014-05-01 NOTE — CV Procedure (Signed)
      Cardiac Catheterization Operative Report  Andrew Huerta [id number] 8/31/20154:11 PM [name], MD  Procedure Performed:  1. Left Heart Catheterization 2. Selective Coronary Angiography 3. Left ventricular angiogram  Operator: Lauree Chandler, MD  Arterial access site:  Right radial artery.   Indication: 48 yo male with history of CAD with recent DES placement mid LAD and mid Circumflex by Dr. Martinique 04/27/14.                                       Procedure Details: The risks, benefits, complications, treatment options, and expected outcomes were discussed with the patient. The patient and/or family concurred with the proposed plan, giving informed consent. The patient was brought to the cath lab after IV hydration was begun and oral premedication was given. The patient was further sedated with Versed and Fentanyl. The right wrist was assessed with a reverse Allens test which was positive. The right wrist was prepped and draped in a sterile fashion. 1% lidocaine was used for local anesthesia. Using the modified Seldinger access technique, a 5 French sheath was placed in the right radial artery. 3 mg Verapamil was given through the sheath. 3500 units IV heparin was given. Standard diagnostic catheters were used to perform selective coronary angiography. A pigtail catheter was used to perform a left ventricular angiogram. The sheath was removed from the right radial artery and a Terumo hemostasis band was applied at the arteriotomy site on the right wrist.    There were no immediate complications. The patient was taken to the recovery area in stable condition.   Hemodynamic Findings: Central aortic pressure: 116/74 Left ventricular pressure: 102/2/5  Angiographic Findings:  Left main: No obstructive disease  Left Anterior Descending Artery: Large caliber vessel that courses to the apex. 30% proximal stenosis. Patent mid vessel stent with no restenosis. 30% stenosis  beyond the stented segment in the mid vessel. There are several very small caliber diagonal branches.   Circumflex Artery: Large caliber vessel with patent stent in the mid vessel, no restenosis. Just beyond the stent in the distal vessel there is 30% stenosis. This does not appear to be flow limiting.   Right Coronary Artery: Large dominant vessel with 20% proximal, mid and distal stenosis. Just before the bifurcation there is a focal 30% stenosis.   Left Ventricular Angiogram: LVEF=60-65%.   Impression: 1. Double vessel CAD with patent stent in the mid LAD, patent stent in the mid Circumflex.  2. Preserved LV systolic function.  3. The cardiac enzyme trend is downward and likely represents continued enzyme trend down from prior event last week.   Recommendations: Continue medical management of CAD       Complications:  None. The patient tolerated the procedure well.

## 2014-05-01 NOTE — Progress Notes (Addendum)
       Patient Name: Andrew Huerta Date of Encounter: 05/01/2014    SUBJECTIVE: Clinical data has been reviewed. The patient has had intermittent atypical left chest discomfort with radiation to left arm. The symptoms are not as intense but similar to those prior to stenting last week. He is comfortable this morning. Cardiac markers have been elevated but it is difficult to determine if this is post PCI phenomenon or related to current presentation.  TELEMETRY:  Normal sinus rhythm: Filed Vitals:   05/01/14 0012 05/01/14 0413 05/01/14 0414 05/01/14 0817  BP: 118/84 128/84 128/84 121/84  Pulse: 62  65   Temp: 97.3 F (36.3 C)  97.2 F (36.2 C) 97.3 F (36.3 C)  TempSrc: Oral  Oral Oral  Resp: 14 14 13 14   Height:      Weight:      SpO2: 98%  99% 100%    Intake/Output Summary (Last 24 hours) at 05/01/14 0920 Last data filed at 05/01/14 0800  Gross per 24 hour  Intake 1502.83 ml  Output   2650 ml  Net -1147.17 ml   LABS: Basic Metabolic Panel:  Recent Labs  04/29/14 1515  NA 134*  K 4.8  CL 100  CO2 19  GLUCOSE 103*  BUN 19  CREATININE 1.06  CALCIUM 9.1   CBC:  Recent Labs  04/30/14 1015 05/01/14 0255  WBC 9.7 9.6  HGB 14.4 14.3  HCT 40.6 41.2  MCV 89.8 89.4  PLT 209 213   Cardiac Enzymes:  Recent Labs  04/29/14 2245 04/30/14 0345 04/30/14 1015  TROPONINI 1.77* 1.71* 1.19*   Radiology/Studies:  Unremarkable  Physical Exam: Blood pressure 121/84, pulse 65, temperature 97.3 F (36.3 C), temperature source Oral, resp. rate 14, height 5\' 7"  (1.702 m), weight 162 lb (73.483 kg), SpO2 100.00%. Weight change:   Wt Readings from Last 3 Encounters:  04/29/14 162 lb (73.483 kg)  04/29/14 162 lb (73.483 kg)  04/28/14 162 lb 7.7 oz (73.7 kg)    No pericardial friction rub. Lungs are clear Extremities with no edema. Right radial cath site is unremarkable.  ASSESSMENT:  1. Atypical chest pain in a patient with multivessel stent procedure  performed on 04/27/2014 2. Elevated cardiac markers, could represent ACS but are likely residual elevation following PCI 3. Coronary artery disease with diffuse distal circumflex disease and mid LAD beyond stent sites.  Plan:  Coronary angiography to relook at stents and rule out the possibility of progression distal to the stented segments. If cath findings are stable will adjust medical regimen and discharged home later today or in a.m.  Relook cath discussed with the patient and wife. They understand the indication and potential risk. He would prefer having a repeat catheterization from the radial approach rather than femoral. Risk of stroke, death, myocardial infarction, infection, among other complications were discussed in detail with the patient and accepted.  Add long acting nitrate to treat possible vasoconstrictive component.  Andrew Huerta 05/01/2014, 9:20 AM

## 2014-05-01 NOTE — Progress Notes (Signed)
ANTICOAGULATION CONSULT NOTE - Follow Up Consult  Pharmacy Consult for heparin Indication: chest pain/ACS  Labs:  Recent Labs  04/29/14 1515  04/29/14 2245  04/30/14 0345 04/30/14 1015 04/30/14 1720 05/01/14 0255 05/01/14 0945  HGB 13.8  --   --   --   --  14.4  --  14.3  --   HCT 39.9  --   --   --   --  40.6  --  41.2  --   PLT 231  --   --   --   --  209  --  213  --   HEPARINUNFRC  --   --   --   < >  --  0.25* 0.50 0.76* 0.61  CREATININE 1.06  --   --   --   --   --   --   --   --   TROPONINI  --   < > 1.77*  --  1.71* 1.19*  --   --   --   < > = values in this interval not displayed.   Assessment: 48yo male with CP and recent PCI on heparin at 1200 units/hr and heparin level is at goal (HL= 0.61). Patient is noted for cath today.  Goal of Therapy:  Heparin level 0.3-0.7 units/ml   Plan:  -No heparin changes needed -Will follow post cath  Hildred Laser, Pharm D 05/01/2014 11:21 AM

## 2014-05-01 NOTE — Progress Notes (Signed)
ANTICOAGULATION CONSULT NOTE - Follow Up Consult  Pharmacy Consult for heparin Indication: chest pain/ACS  Labs:  Recent Labs  04/28/14 0447 04/29/14 1515  04/29/14 2245  04/30/14 0345 04/30/14 1015 04/30/14 1720 05/01/14 0255  HGB 14.9 13.8  --   --   --   --  14.4  --  14.3  HCT 43.3 39.9  --   --   --   --  40.6  --  41.2  PLT 198 231  --   --   --   --  209  --  213  HEPARINUNFRC  --   --   --   --   < >  --  0.25* 0.50 0.76*  CREATININE 0.96 1.06  --   --   --   --   --   --   --   TROPONINI  --   --   < > 1.77*  --  1.71* 1.19*  --   --   < > = values in this interval not displayed.   Assessment: 48yo male now supratherapeutic on heparin after one level at goal.  Goal of Therapy:  Heparin level 0.3-0.7 units/ml   Plan:  Will decrease heparin gtt by ~10% to 1200 units/hr and check level in 6hr.  Wynona Neat, PharmD, BCPS  05/01/2014,4:03 AM

## 2014-05-02 DIAGNOSIS — R0789 Other chest pain: Secondary | ICD-10-CM

## 2014-05-02 DIAGNOSIS — I209 Angina pectoris, unspecified: Secondary | ICD-10-CM

## 2014-05-02 LAB — CBC
HEMATOCRIT: 40.6 % (ref 39.0–52.0)
Hemoglobin: 13.9 g/dL (ref 13.0–17.0)
MCH: 30.3 pg (ref 26.0–34.0)
MCHC: 34.2 g/dL (ref 30.0–36.0)
MCV: 88.6 fL (ref 78.0–100.0)
Platelets: 205 10*3/uL (ref 150–400)
RBC: 4.58 MIL/uL (ref 4.22–5.81)
RDW: 12.9 % (ref 11.5–15.5)
WBC: 9.3 10*3/uL (ref 4.0–10.5)

## 2014-05-02 MED ORDER — ISOSORBIDE MONONITRATE ER 30 MG PO TB24: 30.0000 mg | ORAL_TABLET | Freq: Every day | ORAL | Status: DC

## 2014-05-02 NOTE — Progress Notes (Signed)
CARDIAC REHAB PHASE I   PRE:  Rate/Rhythm: 81 SR  BP:  Supine: 152/97, 143/104 with dinamapp  Sitting:   Standing:    SaO2:   MODE:  Ambulation: 1000 ft   POST:  Rate/Rhythm: 90 SR  BP:  Supine:   Sitting: 175/101 dinamapp  But 138/80 manually  Standing:    SaO2:  0857-0930 Pt seen by me last week. No questions re ed. Pt walked 1000 ft with steady gait. No dizziness. Tolerated well. BP high by dinamapp but stable when taken manually. NO CP. Stated a little sore in chest from procedure. In good spirits.   Graylon Good, RN BSN  05/02/2014 9:26 AM

## 2014-05-02 NOTE — Care Management Note (Addendum)
  Page 1 of 1   05/02/2014     2:56:38 PM CARE MANAGEMENT NOTE 05/02/2014  Patient:  LIN, GLAZIER   Account Number:  [id number]  Date Initiated:  05/02/2014  Documentation initiated by:  Mariann Laster  Subjective/Objective Assessment:   CP     Action/Plan:   CM to follow for disposition needs   Anticipated DC Date:  05/02/2014   Anticipated DC Plan:  HOME/SELF CARE         Choice offered to / List presented to:             Status of service:  Completed, signed off Medicare Important Message given?  NA - LOS <3 / Initial given by admissions (If response is "NO", the following Medicare IM given date fields will be blank) Date Medicare IM given:   Medicare IM given by:   Date Additional Medicare IM given:   Additional Medicare IM given by:    Discharge Disposition:  HOME/SELF CARE  Per UR Regulation:  Reviewed for med. necessity/level of care/duration of stay  If discussed at [place] of Stay Meetings, dates discussed:    Comments:  Grayson White RN, BSN, MSHL, CCM  Nurse - Case Manager,  (Unit [phone]  05/02/2014 Med Review:  Br[place]  (MCD $3.00) Dispostion Plan:  Home / Self care.

## 2014-05-02 NOTE — Progress Notes (Signed)
TR BAND REMOVAL  LOCATION:    right radial  DEFLATED PER PROTOCOL:    Yes.    TIME BAND OFF / DRESSING APPLIED:    19:30   SITE UPON ARRIVAL:    Level 0  SITE AFTER BAND REMOVAL:    Level 0  REVERSE Beckem'S TEST:     positive  CIRCULATION SENSATION AND MOVEMENT:    Within Normal Limits   Yes.    COMMENTS:   Pt tolerated removal of TR band without distress or complications.  Will continue to monitor patient.

## 2014-05-02 NOTE — Progress Notes (Signed)
UR completed Alejos Reinhardt K. Vyctoria Dickman, RN, BSN, [place], CCM  05/02/2014 2:48 PM

## 2014-05-02 NOTE — Discharge Summary (Signed)
Physician Discharge Summary      Patient ID: Andrew Huerta MRN: [id number] DOB/AGE: 01-07-1966 48 y.o.  Admit date: 04/29/2014 Discharge date: 05/02/2014  Admission Diagnoses:  Chest pain  Discharge Diagnoses:  Principal Problem:   Chest pain Active Problems:   HYPERTENSION, ESSENTIAL   CAD (coronary artery disease)   Hyperlipidemia   Hypotension  Discharged Condition: stable  Hospital Course:   Mr. Karman is a 48 yo man with PMH of dyslipidemia, GERD, hypertension, CAD with DES to mLAD and mLCx implanted on 04/27/2014, who presented with low blood pressure and chest pain. He called in earlier today with bp of 80s/60s at home with symptoms of an irritated heart and given his challenging symptoms he was advised to come to the ER. In the ER he was started on heparin gtt given his elevated troponins (1.9 --> 1.2).  He reported he had significant lowering of his blood pressure with lisinopril before. He stated his current pain is similar in character as before the PCI but not as severe, left sided, mid-check, feels in between pressure and like someone hit him. We discussed importance of notifying RN if symptoms come back and our plan to hold lisinopril, watch on the monitor, repeat ECG and see how he does overnight.   He was continued on aspirin 81 mg, ticagrelor 90 mg bid, atorvastatin, metoprolol.  Lisinopril was stopped due to hypotension.  Troponin started at 1.90 and trended down.  He went for a left heart cath which revealed double vessel CAD with patent stent in the mid LAD, patent stent in the mid Circumflex.  Preserved LV systolic function.  The cardiac enzyme trend is downward and likely represents continued enzyme trend down from prior event last week.   He is still having some mild hypotension.  We will not restart ACE-I.  He ambulated this morning without difficulty.  The patient was seen by Dr. Irish Lack who felt he was stable for DC home.   Consults: None  Significant  Diagnostic Studies:   Cardiac Catheterization Operative Report  Andrew Huerta  [id number]  8/31/20154:11 PM  [name], MD  Procedure Performed:  1. Left Heart Catheterization 2. Selective Coronary Angiography 3. Left ventricular angiogram Operator: Lauree Chandler, MD  Arterial access site: Right radial artery.  Indication: 48 yo male with history of CAD with recent DES placement mid LAD and mid Circumflex by Dr. Martinique 04/27/14.  Procedure Details:  The risks, benefits, complications, treatment options, and expected outcomes were discussed with the patient. The patient and/or family concurred with the proposed plan, giving informed consent. The patient was brought to the cath lab after IV hydration was begun and oral premedication was given. The patient was further sedated with Versed and Fentanyl. The right wrist was assessed with a reverse Allens test which was positive. The right wrist was prepped and draped in a sterile fashion. 1% lidocaine was used for local anesthesia. Using the modified Seldinger access technique, a 5 French sheath was placed in the right radial artery. 3 mg Verapamil was given through the sheath. 3500 units IV heparin was given. Standard diagnostic catheters were used to perform selective coronary angiography. A pigtail catheter was used to perform a left ventricular angiogram. The sheath was removed from the right radial artery and a Terumo hemostasis band was applied at the arteriotomy site on the right wrist.  There were no immediate complications. The patient was taken to the recovery area in stable condition.  Hemodynamic Findings:  Central aortic  pressure: 116/74  Left ventricular pressure: 102/2/5  Angiographic Findings:  Left main: No obstructive disease  Left Anterior Descending Artery: Large caliber vessel that courses to the apex. 30% proximal stenosis. Patent mid vessel stent with no restenosis. 30% stenosis beyond the stented segment in the mid  vessel. There are several very small caliber diagonal branches.  Circumflex Artery: Large caliber vessel with patent stent in the mid vessel, no restenosis. Just beyond the stent in the distal vessel there is 30% stenosis. This does not appear to be flow limiting.  Right Coronary Artery: Large dominant vessel with 20% proximal, mid and distal stenosis. Just before the bifurcation there is a focal 30% stenosis.  Left Ventricular Angiogram: LVEF=60-65%.  Impression:  1. Double vessel CAD with patent stent in the mid LAD, patent stent in the mid Circumflex.  2. Preserved LV systolic function.  3. The cardiac enzyme trend is downward and likely represents continued enzyme trend down from prior event last week.  Recommendations: Continue medical management of CAD  Complications: None. The patient tolerated the procedure well.   Treatments: See above  Discharge Exam: Blood pressure 104/86, pulse 62, temperature 98.1 F (36.7 C), temperature source Oral, resp. rate 20, height 5\' 7"  (1.702 m), weight 157 lb 10.1 oz (71.5 kg), SpO2 96.00%. General appearance: alert, cooperative and no distress Resp: clear to auscultation bilaterally Chest wall: Mildly tender on the far left. Cardio: regular rate and rhythm, S1, S2 normal, no murmur, click, rub or gallop Extremities: No LEE Pulses: 2+ and symmetric Skin: Warm and dry.  Right wrist mildly sore.  no ecchymosis. Neurologic: Grossly normal  Disposition: 01-Home or Self Care      Discharge Instructions   Diet - low sodium heart healthy    Complete by:  As directed      Discharge instructions    Complete by:  As directed   No lifting with right arm for two days     Increase activity slowly    Complete by:  As directed             Medication List    STOP taking these medications       lisinopril 5 MG tablet  Commonly known as:  PRINIVIL,ZESTRIL      TAKE these medications       aspirin 81 MG chewable tablet  Chew 1 tablet (81 mg  total) by mouth daily.     atorvastatin 80 MG tablet  Commonly known as:  LIPITOR  Take 1 tablet (80 mg total) by mouth daily at 6 PM.     esomeprazole 40 MG capsule  Commonly known as:  NEXIUM  Take 1 capsule (40 mg total) by mouth daily.     gabapentin 300 MG capsule  Commonly known as:  NEURONTIN  Take 300 mg by mouth at bedtime as needed (restless leg).     HYDROcodone-acetaminophen 5-325 MG per tablet  Commonly known as:  NORCO/VICODIN  Take 1-2 tablets by mouth every 8 (eight) hours as needed for moderate pain. Do not fill until 03/15/2014     isosorbide mononitrate 30 MG 24 hr tablet  Commonly known as:  IMDUR  Take 1 tablet (30 mg total) by mouth daily.     metoprolol tartrate 25 MG tablet  Commonly known as:  LOPRESSOR  Take 1 tablet (25 mg total) by mouth 2 (two) times daily.     nitroGLYCERIN 0.4 MG SL tablet  Commonly known as:  NITROSTAT  Place 1 tablet (  0.4 mg total) under the tongue every 5 (five) minutes as needed for chest pain.     ticagrelor 90 MG Tabs tablet  Commonly known as:  BRILINTA  Take 1 tablet (90 mg total) by mouth 2 (two) times daily.     traMADol 50 MG tablet  Commonly known as:  ULTRAM  Take 1 tablet (50 mg total) by mouth every 8 (eight) hours as needed for moderate pain (alternate with the hydrocodone). Do not fill until 04/15/2014       Follow-up Information   Follow up with Peter Martinique, MD. (The office will call you with the follow up appt date and time. )    Specialty:  Cardiology   Contact information:   [address] [place] [address] [phone]      Greater than 30 minutes was spent completing the patient's discharge.     SignedTarri Fuller, [place] 05/02/2014, 9:35 AM  I have examined the patient and reviewed assessment and plan and discussed with patient.  Agree with above as stated.  Right groin sore but stable.  Stay on DAPT.  Blaize Epple S.

## 2014-05-03 ENCOUNTER — Telehealth: Payer: Self-pay | Admitting: Cardiology

## 2014-05-03 NOTE — Telephone Encounter (Signed)
He should stop Imdur.  Tonette Koehne Martinique MD, [facility]

## 2014-05-03 NOTE — Telephone Encounter (Signed)
Returned call to patient Dr.Jordan advised to stop Imdur.

## 2014-05-03 NOTE — Addendum Note (Signed)
Addended by: Golden Hurter D on: 05/03/2014 06:48 PM   Modules accepted: Orders, Medications

## 2014-05-03 NOTE — Telephone Encounter (Signed)
Andrew Huerta is calling because he had stents placed on Thursday and had to go back to ER because his bp is dropping . Blood Pressure today is 76/60.Marland Kitchen Please call    Thanks

## 2014-05-03 NOTE — Telephone Encounter (Signed)
Returned call to patient. He states that he was discharged from hospital yesterday after cath/stents and was started on isosorbide and told to STOP lisinopril. He reports he did not take his BP prior to taking AM medications but that his BP now is 76/60 (no HR reading was provided). He reports that when he left hospital his BP was high, however on discharge summary, BP was in 248L systolic. He denies chest pain, shortness of breath, but he does feel a little "woozy" like he's been drinking.   Will defer to Dr. Martinique to advise on med management - only on lopressor and isosorbide

## 2014-05-04 ENCOUNTER — Ambulatory Visit (INDEPENDENT_AMBULATORY_CARE_PROVIDER_SITE_OTHER): Payer: Medicaid Other | Admitting: Family Medicine

## 2014-05-04 ENCOUNTER — Encounter: Payer: Self-pay | Admitting: Family Medicine

## 2014-05-04 VITALS — BP 107/82 | HR 88 | Temp 98.0°F | Wt 148.0 lb

## 2014-05-04 DIAGNOSIS — R030 Elevated blood-pressure reading, without diagnosis of hypertension: Secondary | ICD-10-CM

## 2014-05-04 DIAGNOSIS — IMO0002 Reserved for concepts with insufficient information to code with codable children: Secondary | ICD-10-CM

## 2014-05-04 DIAGNOSIS — R0989 Other specified symptoms and signs involving the circulatory and respiratory systems: Secondary | ICD-10-CM | POA: Insufficient documentation

## 2014-05-04 DIAGNOSIS — M1710 Unilateral primary osteoarthritis, unspecified knee: Secondary | ICD-10-CM

## 2014-05-04 DIAGNOSIS — I959 Hypotension, unspecified: Secondary | ICD-10-CM

## 2014-05-04 DIAGNOSIS — Z23 Encounter for immunization: Secondary | ICD-10-CM

## 2014-05-04 MED ORDER — HYDROCODONE-ACETAMINOPHEN 5-325 MG PO TABS: 1.0000 | ORAL_TABLET | Freq: Three times a day (TID) | ORAL | Status: DC | PRN

## 2014-05-04 NOTE — Patient Instructions (Signed)
Check your blood pressure tomorrow, if the top number is >100 try taking the 1/2 tab of metoprolol. Check your blood pressure about 2 hours later and see if the blood pressure is low. If the blood pressure is still >100, start taking just the 1/2 tab of metoprolol.  Stop the IMDUR as instructed by the cardiologist. Follow up with them as scheduled.

## 2014-05-04 NOTE — Progress Notes (Signed)
Patient ID: Andrew Huerta, male   DOB: 1965-10-19, 48 y.o.   MRN: [id number]   Subjective:    Patient ID: Andrew Huerta, male    DOB: 1966-03-10, 48 y.o.   MRN: [id number]  HPI  CC: hospital follow up  # CAD:  Went to cardiologist, had nuclear myoview stress that was concerning for ischemia, had cath done 8/27 and found 90% mid-LAD, 30% at LAD origin, 90% in mid circumflex, 30% distal RCA. 2 DLE stents (Promus) placed mid-LAD and circumflex  Discharged 8/28, but readmitted 8/29 for hypotension and chest pain, had repeat cath done that showed 30% stenosis beyond mid-LAD stent; no additional stents placed ROS: no CP, no SOB, +dizziness  # Hypotension  Had been started on lisinopril + metoprolol in hospital, had very low BP and lisinopril was switched to imdur  Again had low BPs over past few days, advised by cardiologist to discontinue metoprolol  Knows when he is having symptoms, gets lightheaded  Review of Systems   See HPI for ROS. All other systems reviewed and are negative.  Past medical history, surgical, family, and social history reviewed and updated in the EMR. No new updates were made today (angioplasty/stents added to history in hospital). Objective:  BP 107/82  Pulse 88  Temp(Src) 98 F (36.7 C) (Oral)  Wt 148 lb (67.132 kg) Vitals reviewed  General: NAD CV: RRR, normal s1/s2, no murmur appreciated. 2+ radial and PT pulses bilaterally Resp: CTAB, normal effort Ext: no edema or cyanosis  Assessment & Plan:  See Problem List Documentation

## 2014-05-04 NOTE — Assessment & Plan Note (Signed)
Reporting episodes of symptomatic hypotension on current regimen. Told to stop imdur yesterday. Today did not take either imdur or metoprolol and his BP is on lower side (107/82). Discussed with him to continue holding imdur as per cardiology, and instructions for decreasing metoprolol to 1/2 tab if BPs are still low (take 1/2 tab if >448 systolic tomorrow, take nothing if below).

## 2014-05-10 ENCOUNTER — Other Ambulatory Visit: Payer: Self-pay

## 2014-05-10 ENCOUNTER — Telehealth: Payer: Self-pay | Admitting: *Deleted

## 2014-05-10 NOTE — Telephone Encounter (Signed)
Received prior auth request for Brilinta. I called Medicaid and gave them information requested. Request is being sent to pharmacist for review and determination will be made within 24 hours. Prior auth number--[id number]

## 2014-05-12 ENCOUNTER — Telehealth: Payer: Self-pay

## 2014-05-12 DIAGNOSIS — M25531 Pain in right wrist: Secondary | ICD-10-CM

## 2014-05-12 MED ORDER — PRASUGREL HCL 10 MG PO TABS: ORAL_TABLET | ORAL | Status: DC

## 2014-05-12 NOTE — Telephone Encounter (Signed)
Returned call to patient no answer.LMTC. 

## 2014-05-12 NOTE — Telephone Encounter (Signed)
Call to patient to notify him that Brilinta was not a preferred medication and that this was discussed with Dr Martinique. His recommendation was to change to preferred drug Effient 10 mg. Patient understood and agreed with plan. Medication will be filled to [facility].  Patient is also concerned about his BP (has been 100/70). Patient states he has not been taking the Imdur. He also states he spoke with his PCP about the drops in his BP (50K systolic). Per PCP, he stated to take half tablet daily. Patient reports he still has occasional chest pains/angina.  Routed to [facility] LPN

## 2014-05-15 ENCOUNTER — Ambulatory Visit (HOSPITAL_COMMUNITY)
Admission: RE | Admit: 2014-05-15 | Discharge: 2014-05-15 | Disposition: A | Discharge status: Home / Self Care | Payer: Medicaid Other | Source: Ambulatory Visit | Attending: Cardiology | Admitting: Cardiology

## 2014-05-15 DIAGNOSIS — M25531 Pain in right wrist: Secondary | ICD-10-CM

## 2014-05-15 DIAGNOSIS — M25539 Pain in unspecified wrist: Secondary | ICD-10-CM | POA: Diagnosis present

## 2014-05-15 DIAGNOSIS — M79609 Pain in unspecified limb: Secondary | ICD-10-CM

## 2014-05-15 DIAGNOSIS — Z48812 Encounter for surgical aftercare following surgery on the circulatory system: Secondary | ICD-10-CM | POA: Insufficient documentation

## 2014-05-15 NOTE — Telephone Encounter (Signed)
Returned call to patient he stated he received Effient and is taking.Stated he is having throbbing pain rt wrist at cath site.Stated cath done 05/01/14.Stated rt wrist is very sore and if he does anything rt wrist throbs.Dr.Jordan out of office spoke to DOD Dr.Harding he advised needs ultrasound.Appointment scheduled for U/S today 05/15/14 at 2:00 pm.Advised if U/S ok then he can apply warm compresses couple times a day.Advised to keep appointment with Tarri Fuller PA 05/26/14 at 4:00 pm.

## 2014-05-15 NOTE — Addendum Note (Signed)
Addended by: Golden Hurter D on: 05/15/2014 10:18 AM   Modules accepted: Orders

## 2014-05-15 NOTE — Progress Notes (Signed)
Right Limited Arterial Duplex Upper Ext. Completed. There is no evidence of psedoaneursym or AVF of the radial artery.  Oda Cogan, BS, RDMS, RVT

## 2014-05-17 ENCOUNTER — Telehealth: Payer: Self-pay | Admitting: *Deleted

## 2014-05-17 NOTE — Telephone Encounter (Signed)
Message copied by Raiford Simmonds on Wed May 17, 2014  9:15 AM ------      Message from: Leonie Man      Created: Tue May 16, 2014 11:46 PM       The Doppler looks okay. As well as a vascular damage. There may be some bruising and tenderness along the forearm from where the catheters were removed in and out. If painful with bruising, use intermittent ice. Otherwise can use warm compress to help alleviate symptoms.            Leonie Man, MD       ------

## 2014-05-17 NOTE — Telephone Encounter (Signed)
Spoke to patient. Result given . Verbalized understanding  

## 2014-05-22 NOTE — Telephone Encounter (Signed)
Pt has been changed from Brilinta to Effient

## 2014-05-26 ENCOUNTER — Ambulatory Visit (INDEPENDENT_AMBULATORY_CARE_PROVIDER_SITE_OTHER): Payer: Medicaid Other | Admitting: Physician Assistant

## 2014-05-26 ENCOUNTER — Encounter: Payer: Self-pay | Admitting: Physician Assistant

## 2014-05-26 VITALS — BP 110/70 | HR 68 | Ht 67.0 in | Wt 159.3 lb

## 2014-05-26 DIAGNOSIS — I25119 Atherosclerotic heart disease of native coronary artery with unspecified angina pectoris: Secondary | ICD-10-CM

## 2014-05-26 DIAGNOSIS — I10 Essential (primary) hypertension: Secondary | ICD-10-CM

## 2014-05-26 DIAGNOSIS — I200 Unstable angina: Secondary | ICD-10-CM

## 2014-05-26 DIAGNOSIS — E785 Hyperlipidemia, unspecified: Secondary | ICD-10-CM

## 2014-05-26 DIAGNOSIS — I2510 Atherosclerotic heart disease of native coronary artery without angina pectoris: Secondary | ICD-10-CM

## 2014-05-26 DIAGNOSIS — I209 Angina pectoris, unspecified: Secondary | ICD-10-CM

## 2014-05-26 MED ORDER — ISOSORBIDE MONONITRATE ER 30 MG PO TB24: 15.0000 mg | ORAL_TABLET | Freq: Every day | ORAL | Status: DC

## 2014-05-26 NOTE — Assessment & Plan Note (Addendum)
Patient reports chest pain, 4/10, which occurs when he typically gets upset.  Im not sure if this is actually angina or not.  I will add Imdur 15 mg daily and see if it helps.  He will get a new BP cuff, monitor aily and report back if it is below 95/50, or symptomatic.  We will submit a referral to [facility] phase 2.

## 2014-05-26 NOTE — Patient Instructions (Addendum)
1.  Start 15 mg imdur daily.  2.  We will contact cardiac rehab. 3.  Follow up with Dr. Martinique in about 6 weeks.

## 2014-05-26 NOTE — Assessment & Plan Note (Signed)
Blood pressure is well controlled 

## 2014-05-26 NOTE — Assessment & Plan Note (Addendum)
Repeat angiography on 05/01/2014 revealed double vessel CAD with patent stent in the mid LAD, patent stent in the mid Circumflex.  2. Preserved LV systolic function.  On aspirin and brilinta

## 2014-05-26 NOTE — Assessment & Plan Note (Signed)
On statin.

## 2014-05-26 NOTE — Progress Notes (Signed)
Date:  05/29/2014   ID:  Andrew Huerta, DOB June 13, 1966, MRN [id number]  PCP:  [name], MD  Primary Cardiologist:  Martinique     History of Present Illness: Andrew Huerta is a 48 y.o. male Andrew Huerta is a 48 yo man with PMH of dyslipidemia, GERD, hypertension, CAD with DES to mLAD and mLCx implanted on 04/27/2014, who presented to the ER on 04/29/14 with low blood pressure and chest pain. He called in earlier in the day with bp of 80s/60s at home with symptoms of an irritated heart and given his challenging symptoms he was advised to come to the ER. In the ER he was started on heparin gtt given his elevated troponins (1.9 --> 1.2). He reported he had significant lowering of his blood pressure with lisinopril before. He stated his current pain is similar in character as before the PCI but not as severe, left sided, mid-check, feels in between pressure and like someone hit him.   He was continued on aspirin 81 mg, ticagrelor 90 mg bid, atorvastatin, metoprolol. Lisinopril was stopped due to hypotension. Troponin started at 1.90 and trended down. He went for a left heart cath which revealed double vessel CAD with patent stent in the mid LAD, patent stent in the mid Circumflex. Preserved LV systolic function. The cardiac enzyme trend is downward and likely represents continued enzyme trend down from prior event last week.  The patient presented today for post hospital evaluation.  He reports having CP at times, 4/10, which appears to coincide with stressful situations. He also reports some DOE. His BP cuff that was sent to him does not work and I advised him to go get another.   The patient currently denies nausea, vomiting, fever,  shortness of breath at rest, orthopnea, dizziness, PND, cough, congestion, abdominal pain, hematochezia, melena, lower extremity edema, claudication.  Wt Readings from Last 3 Encounters:  05/26/14 159 lb 4.8 oz (72.258 kg)  05/04/14 148 lb (67.132 kg)  05/02/14  157 lb 10.1 oz (71.5 kg)     Past Medical History  Diagnosis Date  . Barrett's esophagus     egd - 11/27/09 - short segment of Barrett's  . Hyperlipidemia   . Anxiety   . Low back pain   . GERD (gastroesophageal reflux disease)   . Hiatal hernia     egd - 11/27/2009  . Depression   . Adrenal tumor     a. s/p adrenal gland resection at [facility].  Marland Kitchen Hypertension   . Sleep apnea   . Degenerative joint disease     Bilateral knees. Significant knee pain since playing football in high school.   Joaquim Lai, kidney   . CAD (coronary artery disease)     a. 04/27/14 [place] s/p DES to mLAD and DES to [facility]    Current Outpatient Prescriptions  Medication Sig Dispense Refill  . aspirin 81 MG chewable tablet Chew 1 tablet (81 mg total) by mouth daily.      Marland Kitchen atorvastatin (LIPITOR) 80 MG tablet Take 1 tablet (80 mg total) by mouth daily at 6 PM.  30 tablet  11  . esomeprazole (NEXIUM) 40 MG capsule Take 1 capsule (40 mg total) by mouth daily.  30 capsule  11  . HYDROcodone-acetaminophen (NORCO/VICODIN) 5-325 MG per tablet Take 1-2 tablets by mouth every 8 (eight) hours as needed for moderate pain. Do not fill until 06/15/2014  90 tablet  0  . metoprolol tartrate (LOPRESSOR) 25 MG tablet  Take 1 tablet (25 mg total) by mouth 2 (two) times daily.  60 tablet  11  . nitroGLYCERIN (NITROSTAT) 0.4 MG SL tablet Place 1 tablet (0.4 mg total) under the tongue every 5 (five) minutes as needed for chest pain.  25 tablet  3  . ticagrelor (BRILINTA) 90 MG TABS tablet Take 90 mg by mouth 2 (two) times daily.      . isosorbide mononitrate (IMDUR) 30 MG 24 hr tablet Take 0.5 tablets (15 mg total) by mouth daily.  30 tablet  3   No current facility-administered medications for this visit.    Allergies:    Allergies  Allergen Reactions  . Cortisone Acetate Swelling  . Darvocet [Propoxyphene N-Acetaminophen] Nausea And Vomiting  . Nsaids Other (See Comments)    Caused stomach ulcers in the past     Social History:   The patient  reports that he has never smoked. He has never used smokeless tobacco. He reports that he drinks about 1.2 ounces of alcohol per week. He reports that he does not use illicit drugs.   Family history:   Family History  Problem Relation Age of Onset  . Diabetes    . Stomach cancer      ROS:  Please see the history of present illness.  All other systems reviewed and negative.   PHYSICAL EXAM: VS:  BP 110/70  Pulse 68  Ht 5\' 7"  (1.702 m)  Wt 159 lb 4.8 oz (72.258 kg)  BMI 24.94 kg/m2 Well nourished, well developed, in no acute distress HEENT: Pupils are equal round react to light accommodation extraocular movements are intact.  Neck: no JVDNo cervical lymphadenopathy. Cardiac: Regular rate and rhythm without murmurs rubs or gallops. Lungs:  clear to auscultation bilaterally, no wheezing, rhonchi or rales Abd: soft, nontender, positive bowel sounds all quadrants, no hepatosplenomegaly Ext: no lower extremity edema.  2+ radial and dorsalis pedis pulses. Skin: warm and dry Neuro:  Grossly normal  EKG:  NSR 68BPM  ASSESSMENT AND PLAN:  Problem List Items Addressed This Visit   CAD (coronary artery disease) - Primary     Repeat angiography on 05/01/2014 revealed double vessel CAD with patent stent in the mid LAD, patent stent in the mid Circumflex.  2. Preserved LV systolic function.  On aspirin and brilinta    Relevant Medications      isosorbide mononitrate (IMDUR) 24 hr tablet   Other Relevant Orders      AMB referral to cardiac rehabilitation      EKG 12-Lead (Completed)   Hyperlipidemia     On statin    Relevant Medications      isosorbide mononitrate (IMDUR) 24 hr tablet   HYPERTENSION, ESSENTIAL     Blood pressure is well controlled.    Relevant Medications      isosorbide mononitrate (IMDUR) 24 hr tablet   Unstable angina     Patient reports chest pain, 4/10, which occurs when he typically gets upset.  Im not sure if this is actually angina or not.  I  will add Imdur 15 mg daily and see if it helps.  He will get a new BP cuff, monitor aily and report back if it is below 95/50, or symptomatic.  We will submit a referral to CR phase 2.    Relevant Medications      isosorbide mononitrate (IMDUR) 24 hr tablet    Other Visit Diagnoses   Coronary artery disease with unspecified angina pectoris  Relevant Medications       isosorbide mononitrate (IMDUR) 24 hr tablet    Other Relevant Orders       EKG 12-Lead (Completed)

## 2014-05-29 ENCOUNTER — Telehealth: Payer: Self-pay

## 2014-05-29 NOTE — Telephone Encounter (Signed)
Spoke to patient received message needs 6 week follow up with Dr.Jordan.Appointment scheduled 07/13/14 at [place] stated he saw Tarri Fuller PA on Friday 05/26/14 and was told to take Imdur 15 mg daily.Stated he took Imdur 15 mg B/P dropped to 70/60.Stated did not help chest pain.Advised not to take Imdur will check with Dr.Jordan tomorrow 05/30/14 and call back.

## 2014-06-01 ENCOUNTER — Telehealth: Payer: Self-pay

## 2014-06-01 NOTE — Telephone Encounter (Signed)
Returned call to patient spoke to Dr.Jordan about low B/P 70/60 after taking imdur.He advised do not take imdur.Advised to keep appointment with Dr.Jordan 07/13/14 at 4:15 pm.

## 2014-06-08 ENCOUNTER — Encounter (HOSPITAL_COMMUNITY)
Admission: RE | Admit: 2014-06-08 | Discharge: 2014-06-08 | Disposition: A | Discharge status: Home / Self Care | Payer: Medicaid Other | Source: Ambulatory Visit | Attending: Cardiology | Admitting: Cardiology

## 2014-06-08 DIAGNOSIS — I2510 Atherosclerotic heart disease of native coronary artery without angina pectoris: Secondary | ICD-10-CM | POA: Insufficient documentation

## 2014-06-08 DIAGNOSIS — Z5189 Encounter for other specified aftercare: Secondary | ICD-10-CM | POA: Insufficient documentation

## 2014-06-08 DIAGNOSIS — Z833 Family history of diabetes mellitus: Secondary | ICD-10-CM | POA: Insufficient documentation

## 2014-06-08 DIAGNOSIS — Z9861 Coronary angioplasty status: Secondary | ICD-10-CM | POA: Insufficient documentation

## 2014-06-08 DIAGNOSIS — F329 Major depressive disorder, single episode, unspecified: Secondary | ICD-10-CM | POA: Insufficient documentation

## 2014-06-08 DIAGNOSIS — Z7902 Long term (current) use of antithrombotics/antiplatelets: Secondary | ICD-10-CM | POA: Insufficient documentation

## 2014-06-08 DIAGNOSIS — F419 Anxiety disorder, unspecified: Secondary | ICD-10-CM | POA: Insufficient documentation

## 2014-06-08 DIAGNOSIS — I10 Essential (primary) hypertension: Secondary | ICD-10-CM | POA: Insufficient documentation

## 2014-06-08 DIAGNOSIS — Z7982 Long term (current) use of aspirin: Secondary | ICD-10-CM | POA: Insufficient documentation

## 2014-06-08 DIAGNOSIS — G4730 Sleep apnea, unspecified: Secondary | ICD-10-CM | POA: Insufficient documentation

## 2014-06-08 DIAGNOSIS — E785 Hyperlipidemia, unspecified: Secondary | ICD-10-CM | POA: Insufficient documentation

## 2014-06-08 DIAGNOSIS — K219 Gastro-esophageal reflux disease without esophagitis: Secondary | ICD-10-CM | POA: Insufficient documentation

## 2014-06-08 DIAGNOSIS — Z79899 Other long term (current) drug therapy: Secondary | ICD-10-CM | POA: Insufficient documentation

## 2014-06-08 DIAGNOSIS — I252 Old myocardial infarction: Secondary | ICD-10-CM | POA: Insufficient documentation

## 2014-06-08 DIAGNOSIS — M1990 Unspecified osteoarthritis, unspecified site: Secondary | ICD-10-CM | POA: Insufficient documentation

## 2014-06-08 NOTE — Progress Notes (Signed)
Cardiac Rehab Medication Review by a Pharmacist  Does the patient  feel that his/her medications are working for him/her?  yes  Has the patient been experiencing any side effects to the medications prescribed?  no  Does the patient measure his/her own blood pressure or blood glucose at home?  yes - Patient reports blood pressure is generally in 100s/70s  Does the patient have any problems obtaining medications due to transportation or finances?   no  Understanding of regimen: excellent Understanding of indications: excellent Potential of compliance: excellent    Pharmacist comments: Patient had a good understanding of his medications and appears to be compliant.  He expressed some concern that he had started to experience increased back pain, possibly related to kidney stones (Chronic problem per patient).  I encouraged him to discuss the pain with his MD, as it is likely a chronic problem and not medication related.  Theron Arista, PharmD Clinical Pharmacist - Resident Pager: [phone] 10/8/20158:14 AM

## 2014-06-12 ENCOUNTER — Encounter (HOSPITAL_COMMUNITY): Payer: Self-pay

## 2014-06-12 ENCOUNTER — Telehealth: Payer: Self-pay | Admitting: Cardiology

## 2014-06-12 ENCOUNTER — Encounter (HOSPITAL_COMMUNITY)
Admission: RE | Admit: 2014-06-12 | Discharge: 2014-06-12 | Disposition: A | Discharge status: Home / Self Care | Payer: Medicaid Other | Source: Ambulatory Visit | Attending: Cardiology | Admitting: Cardiology

## 2014-06-12 DIAGNOSIS — E785 Hyperlipidemia, unspecified: Secondary | ICD-10-CM | POA: Diagnosis not present

## 2014-06-12 DIAGNOSIS — F329 Major depressive disorder, single episode, unspecified: Secondary | ICD-10-CM | POA: Diagnosis not present

## 2014-06-12 DIAGNOSIS — M1990 Unspecified osteoarthritis, unspecified site: Secondary | ICD-10-CM | POA: Diagnosis not present

## 2014-06-12 DIAGNOSIS — Z79899 Other long term (current) drug therapy: Secondary | ICD-10-CM | POA: Diagnosis not present

## 2014-06-12 DIAGNOSIS — I2510 Atherosclerotic heart disease of native coronary artery without angina pectoris: Secondary | ICD-10-CM | POA: Diagnosis not present

## 2014-06-12 DIAGNOSIS — I10 Essential (primary) hypertension: Secondary | ICD-10-CM | POA: Diagnosis not present

## 2014-06-12 DIAGNOSIS — K219 Gastro-esophageal reflux disease without esophagitis: Secondary | ICD-10-CM | POA: Diagnosis not present

## 2014-06-12 DIAGNOSIS — Z7902 Long term (current) use of antithrombotics/antiplatelets: Secondary | ICD-10-CM | POA: Diagnosis not present

## 2014-06-12 DIAGNOSIS — Z5189 Encounter for other specified aftercare: Secondary | ICD-10-CM | POA: Diagnosis present

## 2014-06-12 DIAGNOSIS — G4730 Sleep apnea, unspecified: Secondary | ICD-10-CM | POA: Diagnosis not present

## 2014-06-12 DIAGNOSIS — Z833 Family history of diabetes mellitus: Secondary | ICD-10-CM | POA: Diagnosis not present

## 2014-06-12 DIAGNOSIS — Z9861 Coronary angioplasty status: Secondary | ICD-10-CM | POA: Diagnosis not present

## 2014-06-12 DIAGNOSIS — Z7982 Long term (current) use of aspirin: Secondary | ICD-10-CM | POA: Diagnosis not present

## 2014-06-12 DIAGNOSIS — I252 Old myocardial infarction: Secondary | ICD-10-CM | POA: Diagnosis not present

## 2014-06-12 DIAGNOSIS — F419 Anxiety disorder, unspecified: Secondary | ICD-10-CM | POA: Diagnosis not present

## 2014-06-12 NOTE — Telephone Encounter (Signed)
The patient is a cardiac rehab and is c/o of a tightness in his chest. This pressure comes and goes. He only took one dose of the isosorbide because it effected his bp. Rehab feels this is a chronic issue for the patient. They are going to see if he will try taking 1/2 the isosorbide for about one week and then increase to 15 mg if tolerated. They are to encourage the patient to make sure he is well hydrated. They do not feel he needs to go to the ER. They will call back if patient cont to have problems so his follow up appt can be moved up.

## 2014-06-12 NOTE — Progress Notes (Signed)
Pt started cardiac rehab today.  Pt tolerated light exercise without difficulty. VSS, telemetry-NSR.  Pt c/o 4/10 chest pain which he reports started this morning upon awakening.  Pt also reports episode yesterday.  Pt describes as typical pain for him, in left upper chest, no change with movement, rest or touch. Same pain he experienced with his last heart cath.   Pt states he was intolerant to imdur 15mg  due to hypotension.  Pt only took one dose.  PC to Dr. Doug Sou nurse.   Per Dr. Doug Sou nurse, pt instructed to try Imdur 7.5mg , increasing hydration to maintain blood pressure. And keep appt as scheduled unless symptoms unimproved or worsen.  Pt instructed to request earlier appt if symptoms unimproved or unable to tolerate imdur 7.5mg .    Pt also instructed to present to ED for severe worsening symptoms.  Understanding verbalized.     PHQ-18. Pt is not currently being treated for depression. Pt reports he has taken antidepressant in the past with no improvement in symptoms.  Pt accepting to offer for referral to behavioral health for counseling/medication therapy as indicated. 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  Pt is currently applying for disability.  Pt offered vocational rehab services, he declined stating he has previously been serviced by voc rehab without ability to return to work. Pt tearful while discussing these things.   Pt has supportive family, wife and two children ages 5 and 29.  However, has poor coping skills and unhopeful outlook.  Pt offered emotional support, reassurance and consolance.  Will continue to monitor.   Pt oriented to exercise equipment and routine.  Understanding verbalized.

## 2014-06-14 ENCOUNTER — Encounter (HOSPITAL_COMMUNITY)
Admission: RE | Admit: 2014-06-14 | Discharge: 2014-06-14 | Disposition: A | Discharge status: Home / Self Care | Payer: Medicaid Other | Source: Ambulatory Visit | Attending: Cardiology | Admitting: Cardiology

## 2014-06-14 ENCOUNTER — Telehealth: Payer: Self-pay | Admitting: Cardiology

## 2014-06-14 DIAGNOSIS — Z5189 Encounter for other specified aftercare: Secondary | ICD-10-CM | POA: Diagnosis not present

## 2014-06-14 LAB — GLUCOSE, CAPILLARY: GLUCOSE-CAPILLARY: 137 mg/dL — AB (ref 70–99)

## 2014-06-14 NOTE — Telephone Encounter (Signed)
Attempted to contact schedulers at [employer] office >> extender add-on schedule?   Spoke with Dr. Martinique. Patient should stop Imdur. Keep follow up on 11/12. He did not feel issues warranted an add-on appointment  Called Joann - patient is feeling better. She will inform him to stop nitrate (Imdur) and contact office with any further issues/complaints.   Awaiting return call from [facility]. scheduler.

## 2014-06-14 NOTE — Progress Notes (Signed)
Pt sitting in pharmacy education class at cardiac rehab (pre-exercise).  Pt c/o nausea with vomiting of phelgm.  Initial DJ-57 sysytolic, SV-77, NSR. Pt denies chest pain.   Pt feet elevated, cool compress applied.   Recheck BP-98/50. CBG-137. Pt given gatorade, recheck BP-108/74.   Pt reports he ate yogurt for breakfast this am.  Pt also reports he took imdur 7.5mg  this am.  PC to Dr. Doug Sou triage nurse, Eliezer Lofts who reviewed scenerio and pt symptoms with Dr. Martinique.  Per Dr. Martinique, pt instructed to DC Imdur.  Office visit not indicated today unless pt symptoms increase or worsen. Nausea and vomiting resolved, pt denies pain, dizziness or lightheadedness.  Recheck BP:  94/70 sitting, 106/68 standing.  Pt walked 3 laps around track without return of symptoms BP-120/78.  Pt advised to notify Dr. Doug Sou office if symptoms return and present to ED for sudden or severe symptoms.  Understanding verbalized

## 2014-06-14 NOTE — Telephone Encounter (Signed)
Spoke with Joann at Cardiac Rehab. Patient was sitting in of pharmacy education class when he developed nausea and vomiting. His BP was low (78 systolic). BP gradually increased. He has had issues with chronic chest discomfort and was advised to take 7.5mg  of Imdur on 10/12 triage call (he could not tolerate 15mg  due to hypotenstion). He took this low dose today, which may have caused his hypotension.   Arville Go thinks it is advisable for patient to be seen in office.   Will consult with Dr. Martinique for advice.

## 2014-06-16 ENCOUNTER — Encounter (HOSPITAL_COMMUNITY)
Admission: RE | Admit: 2014-06-16 | Discharge: 2014-06-16 | Disposition: A | Discharge status: Home / Self Care | Payer: Medicaid Other | Source: Ambulatory Visit | Attending: Cardiology | Admitting: Cardiology

## 2014-06-16 DIAGNOSIS — Z5189 Encounter for other specified aftercare: Secondary | ICD-10-CM | POA: Diagnosis not present

## 2014-06-19 ENCOUNTER — Encounter (HOSPITAL_COMMUNITY)
Admission: RE | Admit: 2014-06-19 | Discharge: 2014-06-19 | Disposition: A | Discharge status: Home / Self Care | Payer: Medicaid Other | Source: Ambulatory Visit | Attending: Cardiology | Admitting: Cardiology

## 2014-06-19 DIAGNOSIS — Z5189 Encounter for other specified aftercare: Secondary | ICD-10-CM | POA: Diagnosis not present

## 2014-06-19 NOTE — Progress Notes (Signed)
Pt advised of attempt to schedule new patient consultation with [place].  Unfortunately, the [facility] office does not have available appt until January 2016.  Pt offered to schedule in either [place] or [place] office or another practice.  Pt refused offer to schedule therapy appt for him at this time. Pt states he has list of providers that were given to him by his PCP.  Pt would like to call and schedule appt on his own.  Pt advised I will be happy to assist if necessary. Understanding verbalized.

## 2014-06-21 ENCOUNTER — Encounter (HOSPITAL_COMMUNITY)
Admission: RE | Admit: 2014-06-21 | Discharge: 2014-06-21 | Disposition: A | Discharge status: Home / Self Care | Payer: Medicaid Other | Source: Ambulatory Visit | Attending: Cardiology | Admitting: Cardiology

## 2014-06-21 DIAGNOSIS — Z5189 Encounter for other specified aftercare: Secondary | ICD-10-CM | POA: Diagnosis not present

## 2014-06-23 ENCOUNTER — Encounter (HOSPITAL_COMMUNITY)
Admission: RE | Admit: 2014-06-23 | Discharge: 2014-06-23 | Disposition: A | Discharge status: Home / Self Care | Payer: Medicaid Other | Source: Ambulatory Visit | Attending: Cardiology | Admitting: Cardiology

## 2014-06-23 DIAGNOSIS — Z5189 Encounter for other specified aftercare: Secondary | ICD-10-CM | POA: Diagnosis not present

## 2014-06-26 ENCOUNTER — Encounter (HOSPITAL_COMMUNITY)
Admission: RE | Admit: 2014-06-26 | Discharge: 2014-06-26 | Disposition: A | Discharge status: Home / Self Care | Payer: Medicaid Other | Source: Ambulatory Visit | Attending: Cardiology | Admitting: Cardiology

## 2014-06-26 DIAGNOSIS — Z5189 Encounter for other specified aftercare: Secondary | ICD-10-CM | POA: Diagnosis not present

## 2014-06-28 ENCOUNTER — Encounter (HOSPITAL_COMMUNITY)
Admission: RE | Admit: 2014-06-28 | Discharge: 2014-06-28 | Disposition: A | Discharge status: Home / Self Care | Payer: Medicaid Other | Source: Ambulatory Visit | Attending: Cardiology | Admitting: Cardiology

## 2014-06-28 DIAGNOSIS — Z5189 Encounter for other specified aftercare: Secondary | ICD-10-CM | POA: Diagnosis not present

## 2014-06-30 ENCOUNTER — Encounter (HOSPITAL_COMMUNITY)
Admission: RE | Admit: 2014-06-30 | Discharge: 2014-06-30 | Disposition: A | Discharge status: Home / Self Care | Payer: Medicaid Other | Source: Ambulatory Visit | Attending: Cardiology | Admitting: Cardiology

## 2014-06-30 DIAGNOSIS — Z5189 Encounter for other specified aftercare: Secondary | ICD-10-CM | POA: Diagnosis not present

## 2014-07-03 ENCOUNTER — Encounter (HOSPITAL_COMMUNITY)
Admission: RE | Admit: 2014-07-03 | Discharge: 2014-07-03 | Disposition: A | Discharge status: Home / Self Care | Payer: Medicaid Other | Source: Ambulatory Visit | Attending: Cardiology | Admitting: Cardiology

## 2014-07-03 DIAGNOSIS — I10 Essential (primary) hypertension: Secondary | ICD-10-CM | POA: Insufficient documentation

## 2014-07-03 DIAGNOSIS — Z5189 Encounter for other specified aftercare: Secondary | ICD-10-CM | POA: Diagnosis not present

## 2014-07-03 DIAGNOSIS — I252 Old myocardial infarction: Secondary | ICD-10-CM | POA: Insufficient documentation

## 2014-07-03 DIAGNOSIS — G4730 Sleep apnea, unspecified: Secondary | ICD-10-CM | POA: Diagnosis not present

## 2014-07-03 DIAGNOSIS — F329 Major depressive disorder, single episode, unspecified: Secondary | ICD-10-CM | POA: Diagnosis not present

## 2014-07-03 DIAGNOSIS — Z7902 Long term (current) use of antithrombotics/antiplatelets: Secondary | ICD-10-CM | POA: Insufficient documentation

## 2014-07-03 DIAGNOSIS — M1990 Unspecified osteoarthritis, unspecified site: Secondary | ICD-10-CM | POA: Insufficient documentation

## 2014-07-03 DIAGNOSIS — K219 Gastro-esophageal reflux disease without esophagitis: Secondary | ICD-10-CM | POA: Diagnosis not present

## 2014-07-03 DIAGNOSIS — Z833 Family history of diabetes mellitus: Secondary | ICD-10-CM | POA: Diagnosis not present

## 2014-07-03 DIAGNOSIS — Z9861 Coronary angioplasty status: Secondary | ICD-10-CM | POA: Insufficient documentation

## 2014-07-03 DIAGNOSIS — Z7982 Long term (current) use of aspirin: Secondary | ICD-10-CM | POA: Diagnosis not present

## 2014-07-03 DIAGNOSIS — E785 Hyperlipidemia, unspecified: Secondary | ICD-10-CM | POA: Diagnosis not present

## 2014-07-03 DIAGNOSIS — F419 Anxiety disorder, unspecified: Secondary | ICD-10-CM | POA: Diagnosis not present

## 2014-07-03 DIAGNOSIS — I2510 Atherosclerotic heart disease of native coronary artery without angina pectoris: Secondary | ICD-10-CM | POA: Insufficient documentation

## 2014-07-03 DIAGNOSIS — Z79899 Other long term (current) drug therapy: Secondary | ICD-10-CM | POA: Insufficient documentation

## 2014-07-03 NOTE — Progress Notes (Signed)
Andrew Huerta 48 y.o. male Nutrition Note Spoke with pt.  Nutrition Plan and Nutrition Survey goals reviewed with pt. Pt is not following the Therapeutic Lifestyle Changes diet at this time. Pt reports he eats 1 meal/d due to N/V if he eats something for breakfast, lunch, and "sometimes dinner." Pt c/o vomiting after eating due to GERD/Barrett's esophagus. GERD diet discussed briefly. Pt reports he does not tolerate any beverages with food coloring in them "even though I used to drink a 12-pack of Mt. Dew every day 4-5 years ago." Pt now drinking 3-4 Propel waters daily. Sodium content of Propel discussed. Pt reports difficulty buying food "particularly at the end of the month." Pt states he receives food stamps "but they run out." Pt expressed understanding of the information reviewed. Pt aware of nutrition education classes offered and declined to attend nutrition classes due to ADHD "I won't remember anything you said."   Nutrition Diagnosis ? Food-and nutrition-related knowledge deficit related to lack of exposure to information as related to diagnosis of: ? CVD   Nutrition RX/ Estimated Daily Nutrition Needs for: wt maintenance 2200-2500 Kcal, 70-80 gm fat, 14-17 gm sat fat, 2.2-2.5 gm trans-fat, <1500 mg sodium  Nutrition Intervention ? Pt's individual nutrition plan reviewed with pt. ? Benefits of adopting Therapeutic Lifestyle Changes discussed when Medficts reviewed. ? Pt to attend the Portion Distortion class ? Pt given handouts for: ? Nutrition I class ? Nutrition II class ? Continue client-centered nutrition education by RD, as part of interdisciplinary care. Goal(s) ? Pt to identify and limit food sources of saturated fat, trans fat, and cholesterol Monitor and Evaluate progress toward nutrition goal with team.  Derek Mound, M.Ed, RD, LDN, CDE 07/03/2014 9:43 AM

## 2014-07-05 ENCOUNTER — Encounter (HOSPITAL_COMMUNITY)
Admission: RE | Admit: 2014-07-05 | Discharge: 2014-07-05 | Disposition: A | Discharge status: Home / Self Care | Payer: Medicaid Other | Source: Ambulatory Visit | Attending: Cardiology | Admitting: Cardiology

## 2014-07-05 DIAGNOSIS — Z5189 Encounter for other specified aftercare: Secondary | ICD-10-CM | POA: Diagnosis not present

## 2014-07-05 NOTE — Progress Notes (Signed)
I have reviewed home exercise with Zenia Resides. The patient was advised to walk 2-4 days per week outside of CRP II for 15 minutes, 2 times per day until he can walk 30 minutes continuously.  Pt will also complete one additional day of hand weights outside of CRP II.  Progression of exercise prescription was discussed.  Reviewed THR, pulse, RPE, sign and symptoms, NTG use and when to call 911 or MD.  Pt voiced understanding. 0815  Archie Endo, MS, ACSM RCEP 07/05/2014 11:18 AM

## 2014-07-07 ENCOUNTER — Encounter (HOSPITAL_COMMUNITY)
Admission: RE | Admit: 2014-07-07 | Discharge: 2014-07-07 | Disposition: A | Discharge status: Home / Self Care | Payer: Medicaid Other | Source: Ambulatory Visit | Attending: Cardiology | Admitting: Cardiology

## 2014-07-07 DIAGNOSIS — Z5189 Encounter for other specified aftercare: Secondary | ICD-10-CM | POA: Diagnosis not present

## 2014-07-07 NOTE — Progress Notes (Signed)
Pt arrived at cardiac rehab c/o 2 episodes of chest pain this week that awoke him from sleep.  Pt describes as his typical anginal symptoms,  Pain resolved without treatment.  Pt also reports a few episodes of NTG SL x1 use at home over past few weeks.  Pt denies exertional symptoms.  Pt able to exercise at cardiac rehab without pain.  pc to Dr. Doug Sou nurse to report symptoms.  Pt has upcoming appt with Dr. Martinique 07/13/14.  Pt advised to keep appt as scheduled. Present to ED for severe, unrelieved pain.  Pt also instructed to keep a pain journal detailing time, associated activity and description of pain as well as how pain relieved.   Pt verbalized understanding.

## 2014-07-10 ENCOUNTER — Telehealth: Payer: Self-pay | Admitting: Family Medicine

## 2014-07-10 ENCOUNTER — Encounter (HOSPITAL_COMMUNITY)
Admission: RE | Admit: 2014-07-10 | Discharge: 2014-07-10 | Disposition: A | Discharge status: Home / Self Care | Payer: Medicaid Other | Source: Ambulatory Visit | Attending: Cardiology | Admitting: Cardiology

## 2014-07-10 DIAGNOSIS — IMO0002 Reserved for concepts with insufficient information to code with codable children: Secondary | ICD-10-CM

## 2014-07-10 DIAGNOSIS — Z5189 Encounter for other specified aftercare: Secondary | ICD-10-CM | POA: Diagnosis not present

## 2014-07-10 DIAGNOSIS — M1710 Unilateral primary osteoarthritis, unspecified knee: Secondary | ICD-10-CM

## 2014-07-10 NOTE — Telephone Encounter (Signed)
Patient called to request refill for Vicodin. Please, follow up with Patient.

## 2014-07-11 MED ORDER — HYDROCODONE-ACETAMINOPHEN 5-325 MG PO TABS
1.0000 | ORAL_TABLET | Freq: Three times a day (TID) | ORAL | Status: DC | PRN
Start: 2014-07-16 — End: 2014-08-09

## 2014-07-11 NOTE — Telephone Encounter (Signed)
Pt is aware and will pick up tomorrow. Arjen Deringer,CMA

## 2014-07-11 NOTE — Telephone Encounter (Signed)
Refill printed and left at front ready to be picked up. -Dr. Lamar Benes

## 2014-07-12 ENCOUNTER — Encounter (HOSPITAL_COMMUNITY): Admission: RE | Admit: 2014-07-12 | Payer: Medicaid Other | Source: Ambulatory Visit

## 2014-07-13 ENCOUNTER — Ambulatory Visit: Payer: Medicaid Other | Admitting: Cardiology

## 2014-07-14 ENCOUNTER — Encounter (HOSPITAL_COMMUNITY): Admission: RE | Admit: 2014-07-14 | Payer: Medicaid Other | Source: Ambulatory Visit

## 2014-07-14 ENCOUNTER — Telehealth (HOSPITAL_COMMUNITY): Payer: Self-pay | Admitting: Cardiac Rehabilitation

## 2014-07-14 NOTE — Telephone Encounter (Signed)
pc received from pt stating [facility] office cancelled his 07/13/14 scheduled appt with Dr. Martinique due to [facility] issues. Pt is working with his case worker to resolve the situation.  Pt appt r/s to January 2016.

## 2014-07-17 ENCOUNTER — Encounter (HOSPITAL_COMMUNITY)
Admission: RE | Admit: 2014-07-17 | Discharge: 2014-07-17 | Disposition: A | Discharge status: Home / Self Care | Payer: Medicaid Other | Source: Ambulatory Visit | Attending: Cardiology | Admitting: Cardiology

## 2014-07-17 DIAGNOSIS — Z5189 Encounter for other specified aftercare: Secondary | ICD-10-CM | POA: Diagnosis not present

## 2014-07-19 ENCOUNTER — Encounter (HOSPITAL_COMMUNITY)
Admission: RE | Admit: 2014-07-19 | Discharge: 2014-07-19 | Disposition: A | Discharge status: Home / Self Care | Payer: Medicaid Other | Source: Ambulatory Visit | Attending: Cardiology | Admitting: Cardiology

## 2014-07-19 DIAGNOSIS — Z5189 Encounter for other specified aftercare: Secondary | ICD-10-CM | POA: Diagnosis not present

## 2014-07-21 ENCOUNTER — Encounter (HOSPITAL_COMMUNITY)
Admission: RE | Admit: 2014-07-21 | Discharge: 2014-07-21 | Disposition: A | Discharge status: Home / Self Care | Payer: Medicaid Other | Source: Ambulatory Visit | Attending: Cardiology | Admitting: Cardiology

## 2014-07-21 DIAGNOSIS — Z5189 Encounter for other specified aftercare: Secondary | ICD-10-CM | POA: Diagnosis not present

## 2014-07-24 ENCOUNTER — Encounter (HOSPITAL_COMMUNITY): Payer: Self-pay | Admitting: Family Medicine

## 2014-07-24 ENCOUNTER — Encounter (HOSPITAL_COMMUNITY)
Admission: RE | Admit: 2014-07-24 | Discharge: 2014-07-24 | Disposition: A | Discharge status: Home / Self Care | Payer: Medicaid Other | Source: Ambulatory Visit | Attending: Cardiology | Admitting: Cardiology

## 2014-07-24 ENCOUNTER — Observation Stay (HOSPITAL_COMMUNITY)
Admission: EM | Admit: 2014-07-24 | Discharge: 2014-07-25 | Disposition: A | Discharge status: Home / Self Care | Payer: Medicaid Other | Attending: Emergency Medicine | Admitting: Emergency Medicine

## 2014-07-24 ENCOUNTER — Telehealth: Payer: Self-pay | Admitting: *Deleted

## 2014-07-24 ENCOUNTER — Ambulatory Visit (HOSPITAL_COMMUNITY)
Admit: 2014-07-24 | Discharge: 2014-07-24 | Disposition: A | Discharge status: Home / Self Care | Payer: Medicaid Other | Attending: Cardiology | Admitting: Cardiology

## 2014-07-24 ENCOUNTER — Emergency Department (HOSPITAL_COMMUNITY): Payer: Medicaid Other

## 2014-07-24 DIAGNOSIS — E785 Hyperlipidemia, unspecified: Secondary | ICD-10-CM | POA: Diagnosis not present

## 2014-07-24 DIAGNOSIS — K2270 Barrett's esophagus without dysplasia: Secondary | ICD-10-CM | POA: Diagnosis not present

## 2014-07-24 DIAGNOSIS — M1710 Unilateral primary osteoarthritis, unspecified knee: Secondary | ICD-10-CM | POA: Diagnosis present

## 2014-07-24 DIAGNOSIS — F329 Major depressive disorder, single episode, unspecified: Secondary | ICD-10-CM | POA: Diagnosis not present

## 2014-07-24 DIAGNOSIS — G4730 Sleep apnea, unspecified: Secondary | ICD-10-CM | POA: Insufficient documentation

## 2014-07-24 DIAGNOSIS — Z7982 Long term (current) use of aspirin: Secondary | ICD-10-CM | POA: Diagnosis not present

## 2014-07-24 DIAGNOSIS — Z79899 Other long term (current) drug therapy: Secondary | ICD-10-CM | POA: Insufficient documentation

## 2014-07-24 DIAGNOSIS — R079 Chest pain, unspecified: Secondary | ICD-10-CM | POA: Diagnosis not present

## 2014-07-24 DIAGNOSIS — F419 Anxiety disorder, unspecified: Secondary | ICD-10-CM | POA: Diagnosis not present

## 2014-07-24 DIAGNOSIS — I10 Essential (primary) hypertension: Secondary | ICD-10-CM | POA: Diagnosis present

## 2014-07-24 DIAGNOSIS — Z9861 Coronary angioplasty status: Secondary | ICD-10-CM | POA: Diagnosis not present

## 2014-07-24 DIAGNOSIS — R0602 Shortness of breath: Secondary | ICD-10-CM

## 2014-07-24 DIAGNOSIS — IMO0002 Reserved for concepts with insufficient information to code with codable children: Secondary | ICD-10-CM | POA: Diagnosis present

## 2014-07-24 DIAGNOSIS — R072 Precordial pain: Secondary | ICD-10-CM

## 2014-07-24 DIAGNOSIS — K22711 Barrett's esophagus with high grade dysplasia: Secondary | ICD-10-CM | POA: Diagnosis present

## 2014-07-24 DIAGNOSIS — M79602 Pain in left arm: Secondary | ICD-10-CM | POA: Insufficient documentation

## 2014-07-24 DIAGNOSIS — I2510 Atherosclerotic heart disease of native coronary artery without angina pectoris: Secondary | ICD-10-CM | POA: Diagnosis not present

## 2014-07-24 DIAGNOSIS — K219 Gastro-esophageal reflux disease without esophagitis: Secondary | ICD-10-CM | POA: Diagnosis not present

## 2014-07-24 DIAGNOSIS — K449 Diaphragmatic hernia without obstruction or gangrene: Secondary | ICD-10-CM | POA: Diagnosis not present

## 2014-07-24 DIAGNOSIS — E1169 Type 2 diabetes mellitus with other specified complication: Secondary | ICD-10-CM | POA: Diagnosis present

## 2014-07-24 DIAGNOSIS — R1110 Vomiting, unspecified: Secondary | ICD-10-CM | POA: Diagnosis not present

## 2014-07-24 DIAGNOSIS — Z5189 Encounter for other specified aftercare: Secondary | ICD-10-CM | POA: Diagnosis not present

## 2014-07-24 DIAGNOSIS — Z87442 Personal history of urinary calculi: Secondary | ICD-10-CM | POA: Diagnosis not present

## 2014-07-24 LAB — CBC
HCT: 39.3 % (ref 39.0–52.0)
Hemoglobin: 13.3 g/dL (ref 13.0–17.0)
MCH: 30.6 pg (ref 26.0–34.0)
MCHC: 33.8 g/dL (ref 30.0–36.0)
MCV: 90.6 fL (ref 78.0–100.0)
Platelets: 222 10*3/uL (ref 150–400)
RBC: 4.34 MIL/uL (ref 4.22–5.81)
RDW: 13.4 % (ref 11.5–15.5)
WBC: 9.8 10*3/uL (ref 4.0–10.5)

## 2014-07-24 LAB — BASIC METABOLIC PANEL
ANION GAP: 14 (ref 5–15)
BUN: 18 mg/dL (ref 6–23)
CHLORIDE: 100 meq/L (ref 96–112)
CO2: 26 meq/L (ref 19–32)
Calcium: 9.7 mg/dL (ref 8.4–10.5)
Creatinine, Ser: 0.93 mg/dL (ref 0.50–1.35)
GFR calc Af Amer: >90 mL/min (ref >90)
GFR calc non Af Amer: >90 mL/min (ref >90)
Glucose, Bld: 111 mg/dL — ABNORMAL HIGH (ref 70–99)
Potassium: 4.5 mEq/L (ref 3.7–5.3)
Sodium: 140 mEq/L (ref 137–147)

## 2014-07-24 LAB — POCT I-STAT TROPONIN I: TROPONIN I, POC: 0.01 ng/mL (ref 0.00–0.08)

## 2014-07-24 LAB — TROPONIN I: Troponin I: <0.3 ng/mL (ref <0.30)

## 2014-07-24 LAB — D-DIMER, QUANTITATIVE (NOT AT ARMC)

## 2014-07-24 LAB — TSH: TSH: 0.729 u[IU]/mL (ref 0.350–4.500)

## 2014-07-24 LAB — PRO B NATRIURETIC PEPTIDE: Pro B Natriuretic peptide (BNP): 45.3 pg/mL (ref 0–125)

## 2014-07-24 MED ORDER — HEPARIN (PORCINE) IN NACL 100-0.45 UNIT/ML-% IJ SOLN
1100.0000 [IU]/h | INTRAMUSCULAR | Status: DC
  Administered 2014-07-24: 1100 [IU]/h via INTRAVENOUS
  Filled 2014-07-24: qty 250

## 2014-07-24 MED ORDER — MORPHINE SULFATE 4 MG/ML IJ SOLN
4.0000 mg | Freq: Once | INTRAMUSCULAR | Status: AC
  Administered 2014-07-24: 4 mg via INTRAVENOUS
  Filled 2014-07-24: qty 1

## 2014-07-24 MED ORDER — HYDROCODONE-ACETAMINOPHEN 5-325 MG PO TABS
1.0000 | ORAL_TABLET | Freq: Three times a day (TID) | ORAL | Status: DC | PRN
Start: 2014-07-24 — End: 2014-07-25
  Administered 2014-07-24: 2 via ORAL
  Administered 2014-07-24: 1 via ORAL
  Administered 2014-07-25: 2 via ORAL
  Filled 2014-07-24: qty 1
  Filled 2014-07-24 (×2): qty 2

## 2014-07-24 MED ORDER — ATORVASTATIN CALCIUM 80 MG PO TABS
80.0000 mg | ORAL_TABLET | Freq: Every day | ORAL | Status: DC
  Administered 2014-07-24: 80 mg via ORAL
  Filled 2014-07-24: qty 1

## 2014-07-24 MED ORDER — PANTOPRAZOLE SODIUM 40 MG PO TBEC
80.0000 mg | DELAYED_RELEASE_TABLET | Freq: Every day | ORAL | Status: DC
Start: 2014-07-25 — End: 2014-07-25
  Administered 2014-07-25: 80 mg via ORAL
  Filled 2014-07-24: qty 2

## 2014-07-24 MED ORDER — MORPHINE SULFATE 2 MG/ML IJ SOLN: 2.0000 mg | INTRAMUSCULAR | Status: DC | PRN

## 2014-07-24 MED ORDER — ZOLPIDEM TARTRATE 5 MG PO TABS: 5.0000 mg | ORAL_TABLET | Freq: Every evening | ORAL | Status: DC | PRN

## 2014-07-24 MED ORDER — GABAPENTIN 300 MG PO CAPS: 300.0000 mg | ORAL_CAPSULE | Freq: Every evening | ORAL | Status: DC | PRN

## 2014-07-24 MED ORDER — HEPARIN BOLUS VIA INFUSION
4000.0000 [IU] | Freq: Once | INTRAVENOUS | Status: AC
  Administered 2014-07-24: 4000 [IU] via INTRAVENOUS
  Filled 2014-07-24: qty 4000

## 2014-07-24 MED ORDER — METOPROLOL TARTRATE 25 MG PO TABS
25.0000 mg | ORAL_TABLET | Freq: Two times a day (BID) | ORAL | Status: DC
  Administered 2014-07-24 – 2014-07-25 (×3): 25 mg via ORAL
  Filled 2014-07-24 (×3): qty 1

## 2014-07-24 MED ORDER — ONDANSETRON HCL 4 MG/2ML IJ SOLN: 4.0000 mg | Freq: Four times a day (QID) | INTRAMUSCULAR | Status: DC | PRN

## 2014-07-24 MED ORDER — TICAGRELOR 90 MG PO TABS
90.0000 mg | ORAL_TABLET | Freq: Two times a day (BID) | ORAL | Status: DC
  Administered 2014-07-24 – 2014-07-25 (×2): 90 mg via ORAL
  Filled 2014-07-24 (×2): qty 1

## 2014-07-24 MED ORDER — ACETAMINOPHEN 325 MG PO TABS: 650.0000 mg | ORAL_TABLET | ORAL | Status: DC | PRN

## 2014-07-24 MED ORDER — ALPRAZOLAM 0.25 MG PO TABS: 0.2500 mg | ORAL_TABLET | Freq: Three times a day (TID) | ORAL | Status: DC | PRN

## 2014-07-24 MED ORDER — GI COCKTAIL ~~LOC~~: 30.0000 mL | Freq: Four times a day (QID) | ORAL | Status: DC | PRN

## 2014-07-24 NOTE — Progress Notes (Addendum)
Pt c/o  increased dyspnea on exertion associated with nausea and vomiting with exercise.  Pt also c/o chest pain off and on which is usual for him.  Pt reports similar episode at home a few weeks ago after exertion.   Symptoms somewhat relieved with lying down.  BP-120/80, 02 sat-97%  normal sinus rhythm.  12 lead normal sinus rhythm. Dr. Doug Sou nurse made aware.  Pt taken to ED for evaluation.  Trish, [facility] made aware.

## 2014-07-24 NOTE — H&P (Signed)
History and Physical   Patient ID: Andrew Huerta MRN: [id number], DOB/AGE: 48-Jul-1967 48 y.o. Date of Encounter: 07/24/2014  Primary Physician: [name], MD Primary Cardiologist: Dr. Martinique  Chief Complaint:  Chest pain  HPI: Andrew Huerta is a 48 y.o. male with a history of CAD. He had remote problems with adrenal abnormalities secondary to an adrenal tumor, s/p resection at [facility].   He had been getting chest pain daily for 3 years that was associated with left arm pain and occasional nausea and vomiting. He had consistent dyspnea on exertion that was eventually relieved by rest. He is active and was frustrated by his inability to increase his exercise tolerance and by how long it took him to regain his breath after exertion. The symptoms were felt secondary to the adrenal tumor.  He continued to have symptoms after the adrenal tumor was removed in February 2015, so eventually had a stress test which was abnormal.   Cardiac catheterization 08/27 showed two-vessel disease and he had drug-eluting stents to the mid LAD and mid circumflex. Other disease was nonobstructive.   He had a follow-up catheterization 08/31 for recurrent chest pain that showed nonobstructive disease, 20-30 percent, with patent stents and a preserved EF.  He has continued to get chest pain every day. He still does not feel that he has been able to increase his exercise tolerance. He feels that he has continued to get short of breath with exertion and it takes a prolonged period for him to recover. The chest pain will last minutes to hours. He was told not to take sublingual nitroglycerin if his blood pressure was below a certain point and states his blood pressure is never high enough to take sublingual nitroglycerin so he has not tried this. The chest pain has woken him up at night 3 times. The worse it gets as a 7/10. A normal level is 3/10. He still has the left arm pain with the chest pain. The only  symptom that has improved as the nausea and vomiting. It has decreased from daily to approximately twice a month.  He has been attending cardiac rehabilitation regularly and says he has not mentioned the pain to any one.  Today, he was at cardiac rehabilitation and at the end of his exercise. He had chest pain prior to the beginning of exercise. He developed his usual shortness of breath. Chest pain reached a 7/10. It had been a 3/10 prior to starting the exercise. At the end of the exercise, he was trying to catch his breath but had a vomiting episode. He vomited multiple times in a row, and felt his symptoms were a little worse than usual so he told someone. In the emergency room, his pain has come back down to a 3/10. He denies shortness of breath or nausea. This is his usual level of pain.   Past Medical History  Diagnosis Date  . Barrett's esophagus     egd - 11/27/09 - short segment of Barrett's  . Hyperlipidemia   . Anxiety   . Low back pain   . GERD (gastroesophageal reflux disease)   . Hiatal hernia     egd - 11/27/2009  . Depression   . Adrenal tumor     a. s/p adrenal gland resection at [facility].  Marland Kitchen Hypertension   . Sleep apnea   . Degenerative joint disease     Bilateral knees. Significant knee pain since playing football in high school.   Marland Kitchen  Stone, kidney   . CAD (coronary artery disease)     a. 04/27/14 [place] s/p DES to mLAD and DES to [facility]    Surgical History:  Past Surgical History  Procedure Laterality Date  . Knee arthroscopy Right X 6  . Lithotripsy  X 2  . Kidney stone surgery  X 1  . Coronary angioplasty with stent placement  04/27/2014    3.0 x 16 mm Promus DES to the mid LAD and 3.5 x 28 mm Promus to the mid LCx, otherwise 20-30 percent lesions, EF 55%  . Knee arthroplasty Right 1984  . Adrenalectomy  10/2013  . Cardiac catheterization  05/01/2014    Patent stents, other disease unchanged     I have reviewed the patient's current medications. Prior to Admission  medications   Medication Sig Start Date End Date Taking? Authorizing Provider  aspirin EC 81 MG tablet Take 81 mg by mouth daily.   Yes Historical Provider, MD  atorvastatin (LIPITOR) 80 MG tablet Take 1 tablet (80 mg total) by mouth daily at 6 PM. 04/28/14  Yes Eileen Stanford, PA-C  esomeprazole (NEXIUM) 40 MG capsule Take 1 capsule (40 mg total) by mouth daily. 11/02/13  Yes Leone Brand, MD  gabapentin (NEURONTIN) 300 MG capsule Take 300 mg by mouth at bedtime as needed (restless leg syndrome).   Yes Historical Provider, MD  HYDROcodone-acetaminophen (NORCO/VICODIN) 5-325 MG per tablet Take 1-2 tablets by mouth every 8 (eight) hours as needed for moderate pain. 07/16/14  Yes Leone Brand, MD  metoprolol tartrate (LOPRESSOR) 25 MG tablet Take 1 tablet (25 mg total) by mouth 2 (two) times daily. 04/28/14  Yes Eileen Stanford, PA-C  nitroGLYCERIN (NITROSTAT) 0.4 MG SL tablet Place 1 tablet (0.4 mg total) under the tongue every 5 (five) minutes as needed for chest pain. 04/28/14  Yes Peter M Martinique, MD  ticagrelor (BRILINTA) 90 MG TABS tablet Take 90 mg by mouth 2 (two) times daily.   Yes Historical Provider, MD  aspirin 81 MG chewable tablet Chew 1 tablet (81 mg total) by mouth daily. 04/28/14   Eileen Stanford, PA-C   Allergies:  Allergies  Allergen Reactions  . Cortisone Acetate Swelling  . Darvocet [Propoxyphene N-Acetaminophen] Nausea And Vomiting  . Nsaids Other (See Comments)    Caused stomach ulcers in the past     History   Social History  . Marital Status: Divorced    Spouse Name: N/A    Number of Children: N/A  . Years of Education: N/A   Occupational History  . Unemployed    Social History Main Topics  . Smoking status: Never Smoker   . Smokeless tobacco: Never Used  . Alcohol Use: 1.2 oz/week    2 Shots of liquor per week  . Drug Use: No  . Sexual Activity: Yes   Other Topics Concern  . Not on file   Social History Narrative   Unemployed.    Single  dad. One of his sons has ADHD.   2 grandparents died of CAD in their 13s, otherwise no family history of premature CAD.    Family History  Problem Relation Age of Onset  . Diabetes    . Stomach cancer      Review of Systems:   Full 14-point review of systems otherwise negative except as noted above. Has chronic knee issues from osteoarthritis.  Physical Exam: Blood pressure 143/92, pulse 68, temperature 97.7 F (36.5 C), temperature source Oral, resp. rate  13, SpO2 100 %. General: Well developed, well nourished,male in no acute distress. Head: Normocephalic, atraumatic, sclera non-icteric, no xanthomas, nares are without discharge. Dentition: Good Neck: No carotid bruits. JVD not elevated. No thyromegally Lungs: Good expansion bilaterally. without wheezes or rhonchi.  Heart: Regular rate and rhythm with S1 S2.  No S3 or S4.  No murmur, no rubs, or gallops appreciated. Abdomen: Soft, non-tender, non-distended with normoactive bowel sounds. No hepatomegaly. No rebound/guarding. No obvious abdominal masses. Msk:  Strength and tone appear normal for age. No joint deformities or effusions, no spine or costo-vertebral angle tenderness. Extremities: No clubbing or cyanosis. No edema.  Distal pedal pulses are 2+ in 4 extrem Neuro: Alert and oriented X 3. Moves all extremities spontaneously. No focal deficits noted. Psych:  Responds to questions appropriately with a normal affect. Skin: No rashes or lesions noted  Labs:   Lab Results  Component Value Date   WBC 9.8 07/24/2014   HGB 13.3 07/24/2014   HCT 39.3 07/24/2014   MCV 90.6 07/24/2014   PLT 222 07/24/2014   No results for input(s): INR in the last 72 hours.   Recent Labs Lab 07/24/14 1045  NA 140  K 4.5  CL 100  CO2 26  BUN 18  CREATININE 0.93  CALCIUM 9.7  GLUCOSE 111*    Recent Labs  07/24/14 1055  TROPIPOC 0.01   PRO B NATRIURETIC PEPTIDE (BNP)  Date/Time Value Ref Range Status  07/24/2014 10:45 AM 45.3 0 -  125 pg/mL Final   Lab Results  Component Value Date   DDIMER <0.27 03/18/2014    Radiology/Studies: Dg Chest 2 View 07/24/2014   CLINICAL DATA:  Chest pain with difficulty breathing  EXAM: CHEST  2 VIEW  COMPARISON:  April 29, 2014  FINDINGS: There is no edema or consolidation. The heart size and pulmonary vascularity are normal. No adenopathy. No bone lesions. No pneumothorax. Stents are noted in the left anterior descending and circumflex coronary arteries.  IMPRESSION: No edema or consolidation.   Electronically Signed   By: Lowella Grip M.D.   On: 07/24/2014 10:48     Cardiac Cath: 05/01/2014 Left main: No obstructive disease Left Anterior Descending Artery: Large caliber vessel that courses to the apex. 30% proximal stenosis. Patent mid vessel stent with no restenosis. 30% stenosis beyond the stented segment in the mid vessel. There are several very small caliber diagonal branches.  Circumflex Artery: Large caliber vessel with patent stent in the mid vessel, no restenosis. Just beyond the stent in the distal vessel there is 30% stenosis. This does not appear to be flow limiting.  Right Coronary Artery: Large dominant vessel with 20% proximal, mid and distal stenosis. Just before the bifurcation there is a focal 30% stenosis.  Left Ventricular Angiogram: LVEF=60-65%.  Impression: 1. Double vessel CAD with patent stent in the mid LAD, patent stent in the mid Circumflex.  2. Preserved LV systolic function.  3. The cardiac enzyme trend is downward and likely represents continued enzyme trend down from prior event last week.   ECG: 07/24/2014 Sinus rhythm, no acute ischemic changes Rate 77  ASSESSMENT AND PLAN:  Principal Problem:   Precordial pain - unclear if secondary to CAD but he has known disease. Admit, rule out MI, exercise Myoview in a.m. if enzymes remain negative. Since patient had a cath after his PCI that was without obstructive disease, would cath only for  objective evidence of ischemia.  Active Problems:   Essential hypertension - patient states  his systolic blood pressure is usually less than 110, continue current medications.    Barrett's esophagus - he is on Nexium, would give a GI cocktail and follow symptomatically.    Hyperlipidemia - no recent profile, will check an a.m.  Jonetta Speak, PA-C 07/24/2014 3:08 PM Beeper [phone]  Attending Note:   The patient was seen and examined.  Agree with assessment and plan as noted above.  Changes made to the above note as needed.  Pt is admitted with recurrent CP as well as nausea and vomitting. He has these chest pain symptoms frequently but the nausea and vomiting was new.  Will admit for observation. Check troponin levels. Stress myoview in am.  If the workup is negative for ischemia, It would be interesting to do a cardiopulmonary stress test to look at his anaerobic threshold.   Thayer Headings, Brooke Bonito., MD, [facility] 07/24/2014, 4:31 PM [address]  [place] Pager [phone]

## 2014-07-24 NOTE — Progress Notes (Signed)
ANTICOAGULATION CONSULT NOTE - Initial Consult  Pharmacy Consult for heparin Indication: chest pain/ACS  Allergies  Allergen Reactions  . Cortisone Acetate Swelling  . Darvocet [Propoxyphene N-Acetaminophen] Nausea And Vomiting  . Nsaids Other (See Comments)    Caused stomach ulcers in the past     Patient Measurements: Height: 5\' 7"  (170.2 cm) Weight: 159 lb 6.3 oz (72.3 kg) IBW/kg (Calculated) : 66.1  Vital Signs: Temp: 98 F (36.7 C) (11/23 1618) Temp Source: Oral (11/23 0950) BP: 148/97 mmHg (11/23 1618) Pulse Rate: 65 (11/23 1618)  Labs:  Recent Labs  07/24/14 1045 07/24/14 1522  HGB 13.3  --   HCT 39.3  --   PLT 222  --   CREATININE 0.93  --   TROPONINI  --  <0.30    Estimated Creatinine Clearance: 90.8 mL/min (by C-G formula based on Cr of 0.93).   Medical History: Past Medical History  Diagnosis Date  . Barrett's esophagus     egd - 11/27/09 - short segment of Barrett's  . Hyperlipidemia   . Anxiety   . Low back pain   . GERD (gastroesophageal reflux disease)   . Hiatal hernia     egd - 11/27/2009  . Depression   . Adrenal tumor     a. s/p adrenal gland resection at [facility].  Marland Kitchen Hypertension   . Sleep apnea   . Degenerative joint disease     Bilateral knees. Significant knee pain since playing football in high school.   Joaquim Lai, kidney   . CAD (coronary artery disease)     a. 04/27/14 [place] s/p DES to mLAD and DES to mLCx    Medications:  Prescriptions prior to admission  Medication Sig Dispense Refill Last Dose  . aspirin EC 81 MG tablet Take 81 mg by mouth daily.   07/24/2014 at Unknown time  . atorvastatin (LIPITOR) 80 MG tablet Take 1 tablet (80 mg total) by mouth daily at 6 PM. 30 tablet 11 07/23/2014 at Unknown time  . esomeprazole (NEXIUM) 40 MG capsule Take 1 capsule (40 mg total) by mouth daily. 30 capsule 11 07/24/2014 at Unknown time  . gabapentin (NEURONTIN) 300 MG capsule Take 300 mg by mouth at bedtime as needed (restless leg  syndrome).   unsure  . HYDROcodone-acetaminophen (NORCO/VICODIN) 5-325 MG per tablet Take 1-2 tablets by mouth every 8 (eight) hours as needed for moderate pain. 90 tablet 0 07/24/2014 at Unknown time  . metoprolol tartrate (LOPRESSOR) 25 MG tablet Take 1 tablet (25 mg total) by mouth 2 (two) times daily. 60 tablet 11 07/24/2014 at 0715  . nitroGLYCERIN (NITROSTAT) 0.4 MG SL tablet Place 1 tablet (0.4 mg total) under the tongue every 5 (five) minutes as needed for chest pain. 25 tablet 3 unknown  . ticagrelor (BRILINTA) 90 MG TABS tablet Take 90 mg by mouth 2 (two) times daily.   07/24/2014 at Unknown time  . aspirin 81 MG chewable tablet Chew 1 tablet (81 mg total) by mouth daily. (Patient not taking: Reported on 07/24/2014)   Not Taking at Unknown time   Scheduled:  . atorvastatin  80 mg Oral q1800  . metoprolol tartrate  25 mg Oral BID  . [START ON 07/25/2014] pantoprazole  80 mg Oral Q1200  . ticagrelor  90 mg Oral BID    Assessment: 48 yo male here with CP and recent cath 04/27/14 with stents placed to LAD and circumflex.  Pharmacy to begin heparin for r/o MI.   Patient was on  heparin at last admission (04/2014) with rates up to 1300 units/hr. Patient was noted at goal (HL= 0.61) on 1200 units/hr.  Goal of Therapy:  Heparin level 0.3-0.7 units/ml Monitor platelets by anticoagulation protocol: Yes   Plan:  -Heparin bolus 4000 units IV followed by 1100 units/hr  -Heparin level in 6 hours and daily wth CBC daily  Hildred Laser, Pharm D 07/24/2014 4:41 PM

## 2014-07-24 NOTE — Telephone Encounter (Signed)
Andrew Huerta reports the patient is having increased SOB with exertion, more than his usual. He is c/o chest pain which is a chronic issue but the patient has had two episodes of vomiting this morning. Patient will be taken to the ER for evaluation.

## 2014-07-24 NOTE — ED Notes (Signed)
Suanne Marker, Cardiology PA at the bedside.

## 2014-07-24 NOTE — ED Notes (Signed)
Pt sent here from cardiac rehab. sts he was on the bicycle and began having SOB and vomited. sts having left arm and neck pain. EKG done NSR. sts he feels better now.

## 2014-07-24 NOTE — ED Provider Notes (Signed)
CSN: [id number]     Arrival date & time 07/24/14  3825 History   First MD Initiated Contact with Patient 07/24/14 1100     Chief Complaint  Patient presents with  . Shortness of Breath  . Arm Pain  . Emesis     (Consider location/radiation/quality/duration/timing/severity/associated sxs/prior Treatment) HPI Comments: Patient with CAD, HTN, HL, presents to the emergency department with chief complaint of chest pain and shortness of breath. Patient was sent here from cardiac rehabilitation center. States that he was on a bicycle, and began having increasing shortness of breath and some vomiting. He also complains of left arm pain, and pain that radiates to the left side of his neck. Patient states the pain is 7 out of 10. He states that he has had this pain for quite some time, but it is recently worsened. He recently underwent cardiac catheterization with stent placement in August by Dr. Martinique.  He is taking Brillinta.  He has not tried taking anything for his symptoms.  The history is provided by the patient. No language interpreter was used.    Past Medical History  Diagnosis Date  . Barrett's esophagus     egd - 11/27/09 - short segment of Barrett's  . Hyperlipidemia   . Anxiety   . Low back pain   . GERD (gastroesophageal reflux disease)   . Hiatal hernia     egd - 11/27/2009  . Depression   . Adrenal tumor     a. s/p adrenal gland resection at [facility].  Marland Kitchen Hypertension   . Sleep apnea   . Degenerative joint disease     Bilateral knees. Significant knee pain since playing football in high school.   Joaquim Lai, kidney   . CAD (coronary artery disease)     a. 04/27/14 [place] s/p DES to mLAD and DES to [facility]   Past Surgical History  Procedure Laterality Date  . Knee arthroscopy Right X 6  . Lithotripsy  X 2  . Kidney stone surgery  X 1  . Coronary angioplasty with stent placement  04/27/2014    "2"  . Knee arthroplasty Right 1984  . Adrenalectomy  10/2013   Family History  Problem  Relation Age of Onset  . Diabetes    . Stomach cancer     History  Substance Use Topics  . Smoking status: Never Smoker   . Smokeless tobacco: Never Used  . Alcohol Use: 1.2 oz/week    2 Shots of liquor per week    Review of Systems  Constitutional: Negative for fever and chills.  Respiratory: Positive for shortness of breath.   Cardiovascular: Positive for chest pain.  Gastrointestinal: Negative for nausea, vomiting, diarrhea and constipation.  Genitourinary: Negative for dysuria.  All other systems reviewed and are negative.     Allergies  Cortisone acetate; Darvocet; and Nsaids  Home Medications   Prior to Admission medications   Medication Sig Start Date End Date Taking? Authorizing Provider  aspirin 81 MG chewable tablet Chew 1 tablet (81 mg total) by mouth daily. 04/28/14   Eileen Stanford, PA-C  atorvastatin (LIPITOR) 80 MG tablet Take 1 tablet (80 mg total) by mouth daily at 6 PM. 04/28/14   Eileen Stanford, PA-C  esomeprazole (NEXIUM) 40 MG capsule Take 1 capsule (40 mg total) by mouth daily. 11/02/13   Leone Brand, MD  HYDROcodone-acetaminophen (NORCO/VICODIN) 5-325 MG per tablet Take 1-2 tablets by mouth every 8 (eight) hours as needed for moderate pain. 07/16/14  Leone Brand, MD  metoprolol tartrate (LOPRESSOR) 25 MG tablet Take 1 tablet (25 mg total) by mouth 2 (two) times daily. 04/28/14   Eileen Stanford, PA-C  nitroGLYCERIN (NITROSTAT) 0.4 MG SL tablet Place 1 tablet (0.4 mg total) under the tongue every 5 (five) minutes as needed for chest pain. 04/28/14   Peter M Martinique, MD  ticagrelor (BRILINTA) 90 MG TABS tablet Take 90 mg by mouth 2 (two) times daily.    Historical Provider, MD   BP 127/78 mmHg  Pulse 72  Temp(Src) 97.7 F (36.5 C) (Oral)  Resp 14  SpO2 100% Physical Exam  Constitutional: He is oriented to person, place, and time. He appears well-developed and well-nourished.  HENT:  Head: Normocephalic and atraumatic.  Eyes: Conjunctivae  and EOM are normal. Pupils are equal, round, and reactive to light. Right eye exhibits no discharge. Left eye exhibits no discharge. No scleral icterus.  Neck: Normal range of motion. Neck supple. No JVD present.  Cardiovascular: Normal rate, regular rhythm and normal heart sounds.  Exam reveals no gallop and no friction rub.   No murmur heard. Pulmonary/Chest: Effort normal and breath sounds normal. No respiratory distress. He has no wheezes. He has no rales. He exhibits no tenderness.  Abdominal: Soft. He exhibits no distension and no mass. There is no tenderness. There is no rebound and no guarding.  Musculoskeletal: Normal range of motion. He exhibits no edema or tenderness.  Neurological: He is alert and oriented to person, place, and time.  Skin: Skin is warm and dry.  Psychiatric: He has a normal mood and affect. His behavior is normal. Judgment and thought content normal.  Nursing note and vitals reviewed.   ED Course  Procedures (including critical care time) Results for orders placed or performed during the hospital encounter of 07/24/14  CBC  Result Value Ref Range   WBC 9.8 4.0 - 10.5 K/uL   RBC 4.34 4.22 - 5.81 MIL/uL   Hemoglobin 13.3 13.0 - 17.0 g/dL   HCT 39.3 39.0 - 52.0 %   MCV 90.6 78.0 - 100.0 fL   MCH 30.6 26.0 - 34.0 pg   MCHC 33.8 30.0 - 36.0 g/dL   RDW 13.4 11.5 - 15.5 %   Platelets 222 150 - 400 K/uL  Basic metabolic panel  Result Value Ref Range   Sodium 140 137 - 147 mEq/L   Potassium 4.5 3.7 - 5.3 mEq/L   Chloride 100 96 - 112 mEq/L   CO2 26 19 - 32 mEq/L   Glucose, Bld 111 (H) 70 - 99 mg/dL   BUN 18 6 - 23 mg/dL   Creatinine, Ser 0.93 0.50 - 1.35 mg/dL   Calcium 9.7 8.4 - 10.5 mg/dL   GFR calc non Af Amer >90 >90 mL/min   GFR calc Af Amer >90 >90 mL/min   Anion gap 14 5 - 15  BNP (order ONLY if patient complains of dyspnea/SOB AND you have documented it for THIS visit)  Result Value Ref Range   Pro B Natriuretic peptide (BNP) 45.3 0 - 125 pg/mL    Dg Chest 2 View  07/24/2014   CLINICAL DATA:  Chest pain with difficulty breathing  EXAM: CHEST  2 VIEW  COMPARISON:  April 29, 2014  FINDINGS: There is no edema or consolidation. The heart size and pulmonary vascularity are normal. No adenopathy. No bone lesions. No pneumothorax. Stents are noted in the left anterior descending and circumflex coronary arteries.  IMPRESSION: No edema or  consolidation.   Electronically Signed   By: Lowella Grip M.D.   On: 07/24/2014 10:48     Imaging Review Dg Chest 2 View  07/24/2014   CLINICAL DATA:  Chest pain with difficulty breathing  EXAM: CHEST  2 VIEW  COMPARISON:  April 29, 2014  FINDINGS: There is no edema or consolidation. The heart size and pulmonary vascularity are normal. No adenopathy. No bone lesions. No pneumothorax. Stents are noted in the left anterior descending and circumflex coronary arteries.  IMPRESSION: No edema or consolidation.   Electronically Signed   By: Lowella Grip M.D.   On: 07/24/2014 10:48     EKG Interpretation None      MDM   Final diagnoses:  Chest pain, unspecified chest pain type    Patient with exertional chest pain. Chest pain worsened this morning while riding stationary bicycle at cardiac rehabilitation. Associated shortness breath, nausea, vomiting. Pain radiates to left arm and neck. Will check labs, EKG, and consult cardiology.  Labs and EKG are reassuring. Troponin is not crossing over, but is negative. Will consult cardiology. Anticipate admission for chest pain rule out.  Cardiology to admit patient.   Montine Circle, PA-C 07/24/14 Reader, MD 07/27/14 1501

## 2014-07-25 ENCOUNTER — Encounter (HOSPITAL_COMMUNITY): Payer: Medicaid Other

## 2014-07-25 ENCOUNTER — Encounter (HOSPITAL_COMMUNITY): Payer: Self-pay | Admitting: General Practice

## 2014-07-25 ENCOUNTER — Observation Stay (HOSPITAL_COMMUNITY): Payer: Medicaid Other

## 2014-07-25 DIAGNOSIS — R079 Chest pain, unspecified: Secondary | ICD-10-CM

## 2014-07-25 LAB — CBC
HEMATOCRIT: 35.7 % — AB (ref 39.0–52.0)
HEMOGLOBIN: 12.1 g/dL — AB (ref 13.0–17.0)
MCH: 30.3 pg (ref 26.0–34.0)
MCHC: 33.9 g/dL (ref 30.0–36.0)
MCV: 89.5 fL (ref 78.0–100.0)
Platelets: 186 10*3/uL (ref 150–400)
RBC: 3.99 MIL/uL — ABNORMAL LOW (ref 4.22–5.81)
RDW: 13.4 % (ref 11.5–15.5)
WBC: 10 10*3/uL (ref 4.0–10.5)

## 2014-07-25 LAB — HEPARIN LEVEL (UNFRACTIONATED)
Heparin Unfractionated: 1 IU/mL — ABNORMAL HIGH (ref 0.30–0.70)
Heparin Unfractionated: 0.53 IU/mL (ref 0.30–0.70)

## 2014-07-25 MED ORDER — NITROGLYCERIN 0.4 MG SL SUBL: 0.4000 mg | SUBLINGUAL_TABLET | SUBLINGUAL | Status: DC | PRN

## 2014-07-25 MED ORDER — TECHNETIUM TC 99M SESTAMIBI GENERIC - CARDIOLITE
10.0000 | Freq: Once | INTRAVENOUS | Status: AC | PRN
  Administered 2014-07-25: 10 via INTRAVENOUS

## 2014-07-25 MED ORDER — TECHNETIUM TC 99M SESTAMIBI GENERIC - CARDIOLITE
30.0000 | Freq: Once | INTRAVENOUS | Status: AC | PRN
  Administered 2014-07-25: 30 via INTRAVENOUS

## 2014-07-25 MED ORDER — HEPARIN (PORCINE) IN NACL 100-0.45 UNIT/ML-% IJ SOLN: 1000.0000 [IU]/h | INTRAMUSCULAR | Status: DC

## 2014-07-25 MED ORDER — ACETAMINOPHEN 325 MG PO TABS: 650.0000 mg | ORAL_TABLET | ORAL | Status: DC | PRN

## 2014-07-25 NOTE — Discharge Summary (Signed)
Patient ID: Andrew Huerta,  MRN: [id number], DOB/AGE: 02-Sep-1965 48 y.o.  Admit date: 07/24/2014 Discharge date: 07/25/2014  Primary Care Provider: [name], MD Primary Cardiologist: Dr Martinique  Discharge Diagnoses Principal Problem:   Chest pain with moderate risk of acute coronary syndrome Active Problems:   CAD- 2 V PCI Aug 2015   Essential hypertension   Barrett's esophagus   DJD-knees- needs TKR   Hyperlipidemia    Procedures: GXT Myoview 07/25/14   Hospital Course:  48 y.o. male with a history of CAD. He had remote problems with adrenal abnormalities secondary to an adrenal tumor, s/p resection at [facility] Feb 2015. He had been getting chest pain daily for 3 years that was associated with left arm pain and occasional nausea and vomiting. He had consistent dyspnea on exertion that was eventually relieved by rest. He is active and was frustrated by his inability to increase his exercise tolerance and by how long it took him to regain his breath after exertion. The symptoms were felt secondary to the adrenal tumor. He continued to have symptoms after the adrenal tumor was removed in February 2015, so eventually had a stress test which was abnormal. Cardiac catheterization was done 04/27/2014 and showed two-vessel disease.  He had drug-eluting stents to the mid LAD and mid circumflex placed on 05/01/14. Other disease was nonobstructive. He had recurrent chest pain and a follow-up catheterization 05/01/2014 showed nonobstructive disease,with patent stents and a preserved EF            He was admitted 11.23.15 with chest pain while at cardiac rehab. Troponin was negative. He had a negative exercise Myoview on 07/25/14 and Dr Acie Fredrickson feels he can be discharged.   Discharge Vitals:  Blood pressure 131/78, pulse 78, temperature 97.6 F (36.4 C), temperature source Oral, resp. rate 16, height 5\' 7"  (1.702 m), weight 160 lb (72.576 kg), SpO2 98 %.    Labs: Results for orders placed  or performed during the hospital encounter of 07/24/14 (from the past 24 hour(s))  D-dimer, quantitative     Status: None   Collection Time: 07/24/14  3:22 PM  Result Value Ref Range   D-Dimer, Quant <0.27 0.00 - 0.48 ug/mL-FEU  Troponin I-serum (0, 3, 6 hours)     Status: None   Collection Time: 07/24/14  3:22 PM  Result Value Ref Range   Troponin I <0.30 <0.30 ng/mL  TSH     Status: None   Collection Time: 07/24/14  5:40 PM  Result Value Ref Range   TSH 0.729 0.350 - 4.500 uIU/mL  Troponin I-serum (0, 3, 6 hours)     Status: None   Collection Time: 07/24/14  5:40 PM  Result Value Ref Range   Troponin I <0.30 <0.30 ng/mL  Heparin level (unfractionated)     Status: Abnormal   Collection Time: 07/24/14 11:55 PM  Result Value Ref Range   Heparin Unfractionated 1.00 (H) 0.30 - 0.70 IU/mL  CBC     Status: Abnormal   Collection Time: 07/25/14 12:05 AM  Result Value Ref Range   WBC 10.0 4.0 - 10.5 K/uL   RBC 3.99 (L) 4.22 - 5.81 MIL/uL   Hemoglobin 12.1 (L) 13.0 - 17.0 g/dL   HCT 35.7 (L) 39.0 - 52.0 %   MCV 89.5 78.0 - 100.0 fL   MCH 30.3 26.0 - 34.0 pg   MCHC 33.9 30.0 - 36.0 g/dL   RDW 13.4 11.5 - 15.5 %   Platelets 186 150 -  400 K/uL  Heparin level (unfractionated)     Status: None   Collection Time: 07/25/14 10:24 AM  Result Value Ref Range   Heparin Unfractionated 0.53 0.30 - 0.70 IU/mL    Disposition:  Follow-up Information    Follow up with Peter Martinique, MD On 09/19/2014.   Specialty:  Cardiology   Why:  2:30 pm    Contact information:   [address] [place] [address] [phone]       Discharge Medications:    Medication List    TAKE these medications        acetaminophen 325 MG tablet  Commonly known as:  TYLENOL  Take 2 tablets (650 mg total) by mouth every 4 (four) hours as needed for headache or mild pain.     aspirin EC 81 MG tablet  Take 81 mg by mouth daily.     atorvastatin 80 MG tablet  Commonly known as:  LIPITOR  Take 1  tablet (80 mg total) by mouth daily at 6 PM.     esomeprazole 40 MG capsule  Commonly known as:  NEXIUM  Take 1 capsule (40 mg total) by mouth daily.     gabapentin 300 MG capsule  Commonly known as:  NEURONTIN  Take 300 mg by mouth at bedtime as needed (restless leg syndrome).     HYDROcodone-acetaminophen 5-325 MG per tablet  Commonly known as:  NORCO/VICODIN  Take 1-2 tablets by mouth every 8 (eight) hours as needed for moderate pain.     metoprolol tartrate 25 MG tablet  Commonly known as:  LOPRESSOR  Take 1 tablet (25 mg total) by mouth 2 (two) times daily.     nitroGLYCERIN 0.4 MG SL tablet  Commonly known as:  NITROSTAT  Place 1 tablet (0.4 mg total) under the tongue every 5 (five) minutes as needed for chest pain (for severe chest pressure or tightness).     ticagrelor 90 MG Tabs tablet  Commonly known as:  BRILINTA  Take 90 mg by mouth 2 (two) times daily.         Duration of Discharge Encounter: Greater than 30 minutes including physician time.  Angelena Form PA-C 07/25/2014 2:22 PM   Attending Note:   The patient was seen and examined.  Agree with assessment and plan as noted above.  Changes made to the above note as needed.  See my note from earlier today.  Thayer Headings, Brooke Bonito., MD, [facility] 07/25/2014, 6:17 PM [address],  [place] Pager [phone]

## 2014-07-25 NOTE — Progress Notes (Signed)
Checked with L Kilroy re treadmill vs Lexiscan as patient had Lopressor last evening at 2241. As per L Kilroy will try treadmill test first.

## 2014-07-25 NOTE — Discharge Instructions (Signed)
Chest Pain Observation  It is often hard to give a specific diagnosis for the cause of chest pain. Among other possibilities your symptoms might be caused by inadequate oxygen delivery to your heart (angina). Angina that is not treated or evaluated can lead to a heart attack (myocardial infarction) or death.  Blood tests, electrocardiograms, and X-rays may have been done to help determine a possible cause of your chest pain. After evaluation and observation, your health care provider has determined that it is unlikely your pain was caused by an unstable condition that requires hospitalization. However, a full evaluation of your pain may need to be completed, with additional diagnostic testing as directed. It is very important to keep your follow-up appointments. Not keeping your follow-up appointments could result in permanent heart damage, disability, or death. If there is any problem keeping your follow-up appointments, you must call your health care provider.  HOME CARE INSTRUCTIONS   Due to the slight chance that your pain could be angina, it is important to follow your health care provider's treatment plan and also maintain a healthy lifestyle:  · Maintain or work toward achieving a healthy weight.  · Stay physically active and exercise regularly.  · Decrease your salt intake.  · Eat a balanced, healthy diet. Talk to a dietitian to learn about heart-healthy foods.  · Increase your fiber intake by including whole grains, vegetables, fruits, and nuts in your diet.  · Avoid situations that cause stress, anger, or depression.  · Take medicines as advised by your health care provider. Report any side effects to your health care provider. Do not stop medicines or adjust the dosages on your own.  · Quit smoking. Do not use nicotine patches or gum until you check with your health care provider.  · Keep your blood pressure, blood sugar, and cholesterol levels within normal limits.  · Limit alcohol intake to no more than  1 drink per day for women who are not pregnant and 2 drinks per day for men.  · Do not abuse drugs.  SEEK IMMEDIATE MEDICAL CARE IF:  You have severe chest pain or pressure which may include symptoms such as:  · You feel pain or pressure in your arms, neck, jaw, or back.  · You have severe back or abdominal pain, feel sick to your stomach (nauseous), or throw up (vomit).  · You are sweating profusely.  · You are having a fast or irregular heartbeat.  · You feel short of breath while at rest.  · You notice increasing shortness of breath during rest, sleep, or with activity.  · You have chest pain that does not get better after rest or after taking your usual medicine.  · You wake from sleep with chest pain.  · You are unable to sleep because you cannot breathe.  · You develop a frequent cough or you are coughing up blood.  · You feel dizzy, faint, or experience extreme fatigue.  · You develop severe weakness, dizziness, fainting, or chills.  Any of these symptoms may represent a serious problem that is an emergency. Do not wait to see if the symptoms will go away. Call your local emergency services (911 in the [place] Do not drive yourself to the hospital.  MAKE SURE YOU:  · Understand these instructions.  · Will watch your condition.  · Will get help right away if you are not doing well or get worse.  Document Released: 09/20/2010 Document Revised: 08/23/2013 Document Reviewed: 02/17/2013    ExitCare® Patient Information ©2015 [employer]. This information is not intended to replace advice given to you by your health care provider. Make sure you discuss any questions you have with your health care provider.

## 2014-07-25 NOTE — Progress Notes (Signed)
ANTICOAGULATION CONSULT NOTE - Initial Consult  Pharmacy Consult for heparin Indication: chest pain/ACS  Allergies  Allergen Reactions  . Cortisone Acetate Swelling  . Darvocet [Propoxyphene N-Acetaminophen] Nausea And Vomiting  . Nsaids Other (See Comments)    Caused stomach ulcers in the past   . Xanax [Alprazolam] Other (See Comments)    Pt states causes him to be mean    Patient Measurements: Height: 5\' 7"  (170.2 cm) Weight: 159 lb 6.3 oz (72.3 kg) IBW/kg (Calculated) : 66.1  Vital Signs: Temp: 97.8 F (36.6 C) (11/23 2100) BP: 110/74 mmHg (11/23 2100) Pulse Rate: 65 (11/23 2100)  Labs:  Recent Labs  07/24/14 1045 07/24/14 1522 07/24/14 1740 07/24/14 2355 07/25/14 0005  HGB 13.3  --   --   --  12.1*  HCT 39.3  --   --   --  35.7*  PLT 222  --   --   --  186  HEPARINUNFRC  --   --   --  1.00*  --   CREATININE 0.93  --   --   --   --   TROPONINI  --  <0.30 <0.30  --   --     Estimated Creatinine Clearance: 90.8 mL/min (by C-G formula based on Cr of 0.93).   Medical History: Past Medical History  Diagnosis Date  . Barrett's esophagus     egd - 11/27/09 - short segment of Barrett's  . Hyperlipidemia   . Anxiety   . Low back pain   . GERD (gastroesophageal reflux disease)   . Hiatal hernia     egd - 11/27/2009  . Depression   . Adrenal tumor     a. s/p adrenal gland resection at [facility].  Marland Kitchen Hypertension   . Sleep apnea   . Degenerative joint disease     Bilateral knees. Significant knee pain since playing football in high school.   Joaquim Lai, kidney   . CAD (coronary artery disease)     a. 04/27/14 [place] s/p DES to mLAD and DES to [facility]    Medications:  Prescriptions prior to admission  Medication Sig Dispense Refill Last Dose  . aspirin EC 81 MG tablet Take 81 mg by mouth daily.   07/24/2014 at Unknown time  . atorvastatin (LIPITOR) 80 MG tablet Take 1 tablet (80 mg total) by mouth daily at 6 PM. 30 tablet 11 07/23/2014 at Unknown time  . esomeprazole  (NEXIUM) 40 MG capsule Take 1 capsule (40 mg total) by mouth daily. 30 capsule 11 07/24/2014 at Unknown time  . gabapentin (NEURONTIN) 300 MG capsule Take 300 mg by mouth at bedtime as needed (restless leg syndrome).   unsure  . HYDROcodone-acetaminophen (NORCO/VICODIN) 5-325 MG per tablet Take 1-2 tablets by mouth every 8 (eight) hours as needed for moderate pain. 90 tablet 0 07/24/2014 at Unknown time  . metoprolol tartrate (LOPRESSOR) 25 MG tablet Take 1 tablet (25 mg total) by mouth 2 (two) times daily. 60 tablet 11 07/24/2014 at 0715  . nitroGLYCERIN (NITROSTAT) 0.4 MG SL tablet Place 1 tablet (0.4 mg total) under the tongue every 5 (five) minutes as needed for chest pain. 25 tablet 3 unknown  . ticagrelor (BRILINTA) 90 MG TABS tablet Take 90 mg by mouth 2 (two) times daily.   07/24/2014 at Unknown time  . aspirin 81 MG chewable tablet Chew 1 tablet (81 mg total) by mouth daily. (Patient not taking: Reported on 07/24/2014)   Not Taking at Unknown time   Scheduled:  .  atorvastatin  80 mg Oral q1800  . metoprolol tartrate  25 mg Oral BID  . pantoprazole  80 mg Oral Q1200  . ticagrelor  90 mg Oral BID    Assessment: 48 yo male here with CP and recent cath 04/27/14 with stents placed to LAD and circumflex.  Pharmacy to begin heparin for r/o MI.   Patient was on heparin at last admission (04/2014) with rates up to 1300 units/hr. Patient was noted at goal (HL= 0.61) on 1200 units/hr.  Heparin level is 1.00 with confirmation of appropriate draw in opposite arm from infusion. No bleeding noted. Probable bolus effect.    Goal of Therapy:  Heparin level 0.3-0.7 units/ml Monitor platelets by anticoagulation protocol: Yes   Plan:  Hold heparin for 45 minutes and restart at 1000 units/hr. HL in 6-8 hours.   Curlene Dolphin

## 2014-07-25 NOTE — Progress Notes (Signed)
UR completed 

## 2014-07-25 NOTE — Progress Notes (Addendum)
Subjective:  Andrew Huerta STATES is a 48 y.o. male with a history of CAD. He had remote problems with adrenal abnormalities secondary to an adrenal tumor, s/p resection at [facility].   He had been getting chest pain daily for 3 years that was associated with left arm pain and occasional nausea and vomiting. He had consistent dyspnea on exertion that was eventually relieved by rest. He is active and was frustrated by his inability to increase his exercise tolerance and by how long it took him to regain his breath after exertion. The symptoms were felt secondary to the adrenal tumor. He continued to have symptoms after the adrenal tumor was removed in February 2015, so eventually had a stress test which was abnormal.   Cardiac catheterization 08/27 showed two-vessel disease and he had drug-eluting stents to the mid LAD and mid circumflex. Other disease was nonobstructive.  He had a follow-up catheterization 08/31 for recurrent chest pain that showed nonobstructive disease, 20-30 percent, with patent stents and a preserved EF He continues to have CP   Objective:  Vital Signs in the last 24 hours: Temp:  [97.7 F (36.5 C)-98.6 F (37 C)] 98.6 F (37 C) (11/24 0500) Pulse Rate:  [64-81] 65 (11/24 0500) Resp:  [10-20] 16 (11/24 0500) BP: (110-178)/(71-105) 155/103 mmHg (11/24 0913) SpO2:  [96 %-100 %] 100 % (11/24 0500) Weight:  [159 lb 6.3 oz (72.3 kg)-160 lb (72.576 kg)] 160 lb (72.576 kg) (11/24 0500)  Intake/Output from previous day:  Intake/Output Summary (Last 24 hours) at 07/25/14 0934 Last data filed at 07/24/14 2335  Gross per 24 hour  Intake    460 ml  Output      0 ml  Net    460 ml    Physical Exam: General appearance: alert, cooperative and no distress Lungs: clear to auscultation bilaterally Heart: regular rate and rhythm Extremities: extremities normal, atraumatic, no cyanosis or edema   Rate: 65  Rhythm: normal sinus rhythm  Lab Results:  Recent Labs   07/24/14 1045 07/25/14 0005  WBC 9.8 10.0  HGB 13.3 12.1*  PLT 222 186    Recent Labs  07/24/14 1045  NA 140  K 4.5  CL 100  CO2 26  GLUCOSE 111*  BUN 18  CREATININE 0.93    Recent Labs  07/24/14 1522 07/24/14 1740  TROPONINI <0.30 <0.30   No results for input(s): INR in the last 72 hours.  Imaging: Imaging results have been reviewed  Cardiac Studies:  Assessment/Plan:  48 y.o. male with a history of CAD. He had remote problems with adrenal abnormalities secondary to an adrenal tumor, s/p resection at [facility]. He had been getting chest pain daily for 3 years. The symptoms were felt secondary to the adrenal tumor.He continued to have symptoms after the adrenal tumor was removed in February 2015, so he eventually had a stress test which was abnormal. Cardiac catheterization done 08/27 showed two-vessel disease and he had drug-eluting stents to the mid LAD and mid circumflex. Other disease was nonobstructive. He had a follow-up catheterization 08/31 for recurrent chest pain that showed nonobstructive disease, 20-30 percent, with patent stents and a preserved EF. He has continued to get chest pain every day. He was admitted to the hospital from cardiac rehab 07/24/14 for increased chest pain post exercise. Troponin has beennegative x3.    Principal Problem:   Chest pain with moderate risk of acute coronary syndrome Active Problems:   CAD- 2 V PCI Aug 2015  Essential hypertension   Barrett's esophagus   DJD-knees- needs TKR   Hyperlipidemia    PLAN:  MI r/o. GXT Myoview this am. Pt exercised 10:50 of the [place] without increased chest pain or EKG changes. Images pending. His next OV is with Dr Martinique Han 16th.  Kerin Ransom PA-C Beeper [phone] 07/25/2014, 9:34 AM  Attending Note:   The patient was seen and examined.  Agree with assessment and plan as noted above.  Changes made to the above note as needed.  Pt continues to have his same chronic typical CP. Walked  without trouble on the treadmill. Images pending.  He should be able to go home later today if the scan is normal or low risk.  Thayer Headings, Brooke Bonito., MD, [facility] 07/25/2014, 10:23 AM [address]  [address] Office [phone] Pager [phone]   Addendum:  The Brantley Fling reveals no ischemia.  EF 64%.  He may go home today   Thayer Headings, Brooke Bonito., MD, [facility] 07/25/2014, 1:29 PM [address]  [place] Pager [phone]

## 2014-07-26 ENCOUNTER — Telehealth (HOSPITAL_COMMUNITY): Payer: Self-pay | Admitting: Cardiac Rehabilitation

## 2014-07-26 ENCOUNTER — Encounter (HOSPITAL_COMMUNITY): Payer: Medicaid Other

## 2014-07-26 NOTE — Telephone Encounter (Signed)
-----   Message from Peter M Martinique, MD sent at 07/25/2014  5:07 PM EST ----- Regarding: RE: Cardiac Rehab Yes thanks  Collier Salina ----- Message -----    From: Lowell Guitar, RN    Sent: 07/25/2014   3:30 PM      To: Peter M Martinique, MD Subject: Cardiac Rehab                                  Dear Dr. Martinique,  Andrew Huerta discharged home from hospital today for chest pain evaluation with no abnormalities identified.    Is it ok for pt to return to cardiac rehab?  Thank you, Andi Hence, RN, BSN Cardiac Pulmonary Rehab

## 2014-07-31 ENCOUNTER — Encounter (HOSPITAL_COMMUNITY)
Admission: RE | Admit: 2014-07-31 | Discharge: 2014-07-31 | Disposition: A | Discharge status: Home / Self Care | Payer: Medicaid Other | Source: Ambulatory Visit | Attending: Cardiology | Admitting: Cardiology

## 2014-07-31 DIAGNOSIS — Z5189 Encounter for other specified aftercare: Secondary | ICD-10-CM | POA: Diagnosis not present

## 2014-07-31 LAB — ALDOSTERONE: Aldosterone, Serum: 2 ng/dL

## 2014-08-02 ENCOUNTER — Encounter (HOSPITAL_COMMUNITY)
Admission: RE | Admit: 2014-08-02 | Discharge: 2014-08-02 | Disposition: A | Discharge status: Home / Self Care | Payer: Medicaid Other | Source: Ambulatory Visit | Attending: Cardiology | Admitting: Cardiology

## 2014-08-02 DIAGNOSIS — Z833 Family history of diabetes mellitus: Secondary | ICD-10-CM | POA: Diagnosis not present

## 2014-08-02 DIAGNOSIS — M1990 Unspecified osteoarthritis, unspecified site: Secondary | ICD-10-CM | POA: Insufficient documentation

## 2014-08-02 DIAGNOSIS — G4730 Sleep apnea, unspecified: Secondary | ICD-10-CM | POA: Diagnosis not present

## 2014-08-02 DIAGNOSIS — I10 Essential (primary) hypertension: Secondary | ICD-10-CM | POA: Diagnosis not present

## 2014-08-02 DIAGNOSIS — Z9861 Coronary angioplasty status: Secondary | ICD-10-CM | POA: Diagnosis not present

## 2014-08-02 DIAGNOSIS — K219 Gastro-esophageal reflux disease without esophagitis: Secondary | ICD-10-CM | POA: Insufficient documentation

## 2014-08-02 DIAGNOSIS — I252 Old myocardial infarction: Secondary | ICD-10-CM | POA: Insufficient documentation

## 2014-08-02 DIAGNOSIS — E785 Hyperlipidemia, unspecified: Secondary | ICD-10-CM | POA: Insufficient documentation

## 2014-08-02 DIAGNOSIS — Z7902 Long term (current) use of antithrombotics/antiplatelets: Secondary | ICD-10-CM | POA: Diagnosis not present

## 2014-08-02 DIAGNOSIS — Z5189 Encounter for other specified aftercare: Secondary | ICD-10-CM | POA: Insufficient documentation

## 2014-08-02 DIAGNOSIS — F419 Anxiety disorder, unspecified: Secondary | ICD-10-CM | POA: Diagnosis not present

## 2014-08-02 DIAGNOSIS — I2510 Atherosclerotic heart disease of native coronary artery without angina pectoris: Secondary | ICD-10-CM | POA: Diagnosis not present

## 2014-08-02 DIAGNOSIS — Z7982 Long term (current) use of aspirin: Secondary | ICD-10-CM | POA: Diagnosis not present

## 2014-08-02 DIAGNOSIS — F329 Major depressive disorder, single episode, unspecified: Secondary | ICD-10-CM | POA: Insufficient documentation

## 2014-08-02 DIAGNOSIS — Z79899 Other long term (current) drug therapy: Secondary | ICD-10-CM | POA: Diagnosis not present

## 2014-08-04 ENCOUNTER — Encounter (HOSPITAL_COMMUNITY)
Admission: RE | Admit: 2014-08-04 | Discharge: 2014-08-04 | Disposition: A | Discharge status: Home / Self Care | Payer: Medicaid Other | Source: Ambulatory Visit | Attending: Cardiology | Admitting: Cardiology

## 2014-08-04 DIAGNOSIS — Z5189 Encounter for other specified aftercare: Secondary | ICD-10-CM | POA: Diagnosis not present

## 2014-08-07 ENCOUNTER — Encounter (HOSPITAL_COMMUNITY)
Admission: RE | Admit: 2014-08-07 | Discharge: 2014-08-07 | Disposition: A | Discharge status: Home / Self Care | Payer: Medicaid Other | Source: Ambulatory Visit | Attending: Cardiology | Admitting: Cardiology

## 2014-08-07 DIAGNOSIS — Z5189 Encounter for other specified aftercare: Secondary | ICD-10-CM | POA: Diagnosis not present

## 2014-08-09 ENCOUNTER — Encounter (HOSPITAL_COMMUNITY)
Admission: RE | Admit: 2014-08-09 | Discharge: 2014-08-09 | Disposition: A | Discharge status: Home / Self Care | Payer: Medicaid Other | Source: Ambulatory Visit | Attending: Cardiology | Admitting: Cardiology

## 2014-08-09 ENCOUNTER — Ambulatory Visit (INDEPENDENT_AMBULATORY_CARE_PROVIDER_SITE_OTHER): Payer: Medicaid Other | Admitting: Family Medicine

## 2014-08-09 VITALS — BP 118/83 | HR 76 | Temp 97.8°F | Ht 67.0 in | Wt 160.7 lb

## 2014-08-09 DIAGNOSIS — Z7189 Other specified counseling: Secondary | ICD-10-CM

## 2014-08-09 DIAGNOSIS — M1710 Unilateral primary osteoarthritis, unspecified knee: Secondary | ICD-10-CM

## 2014-08-09 DIAGNOSIS — G8929 Other chronic pain: Secondary | ICD-10-CM | POA: Insufficient documentation

## 2014-08-09 DIAGNOSIS — R0789 Other chest pain: Secondary | ICD-10-CM

## 2014-08-09 DIAGNOSIS — Z5189 Encounter for other specified aftercare: Secondary | ICD-10-CM | POA: Diagnosis not present

## 2014-08-09 DIAGNOSIS — M179 Osteoarthritis of knee, unspecified: Secondary | ICD-10-CM

## 2014-08-09 DIAGNOSIS — IMO0002 Reserved for concepts with insufficient information to code with codable children: Secondary | ICD-10-CM

## 2014-08-09 MED ORDER — HYDROCODONE-ACETAMINOPHEN 5-325 MG PO TABS: 1.0000 | ORAL_TABLET | Freq: Three times a day (TID) | ORAL | Status: DC | PRN

## 2014-08-09 NOTE — Progress Notes (Signed)
   Subjective:    Patient ID: Andrew Huerta, male    DOB: 05-16-1966, 47 y.o.   MRN: [id number]  HPI  CC: hospital f/u  # Hospital f/u Chest pain:  Recent readmission for chest pain, had exercise stress test that was normal  Going to cardiac rehab until end of January  09/19/13 next cardiologist appt ROS: no CP currently, no SOB, no palpitations, no syncope Relevant PMH: recent stents placed in September  # left leg pain  Severe pain while at therapy today, just got off stair stepper and was on track for cool down  Located left anterior thigh, no radiation  Not tight, doesn't feel like a muscle spasm ROS: no bowel/bladder incontinence  Review of Systems   See HPI for ROS. All other systems reviewed and are negative.  Past medical history, surgical, family, and social history reviewed and updated in the EMR as appropriate. Objective:  BP 118/83 mmHg  Pulse 76  Temp(Src) 97.8 F (36.6 C) (Oral)  Ht 5\' 7"  (1.702 m)  Wt 160 lb 11.2 oz (72.893 kg)  BMI 25.16 kg/m2 Vitals reviewed  General: nad CV: RRR, normal heart sounds, no murmurs, 2+ radial pulses Resp: CTAB, normal effort Abdomen: soft, tender epigastrium no rebound or guarding, normal bowel sounds Ext: mildly tender anterior left upper leg, some tightness of quads aprpeciated compared to the right  Assessment & Plan:  See Problem List Documentation

## 2014-08-09 NOTE — Progress Notes (Signed)
Pt c/o chest pain with exercise today at cardiac rehab.  Worse today than usual.  Relieved with vomiting.  Normal sinus rhythm on telemetry.  BP-110/70.  Pt had recent hospital admission for chest pain with negative stress test.  Pt unable to tolerate nitroglycerin PO or SL due to hypotension.  Pt has history of barretts esophagus and hiatal hernia.  Pt also has history of significant anxiety.  Pt has appt today at 10:45 with PCP Dr. Dema Severin.

## 2014-08-09 NOTE — Assessment & Plan Note (Signed)
Recent hospitalization for again chest pain, has been followed by cardiology since stents put in 3 months ago. No evidence of cardiac etiology with most recent workup. He is also followed by GI for Barrett's with endoscopy every 2 years. No current CP.  Plan: cont cardiac rehab, f/u cardiologist.

## 2014-08-09 NOTE — Assessment & Plan Note (Addendum)
Discussed clinic policy regarding pain contract and UDS. Pt to come back in January for appt to discuss this issue specifically. Pt declines UDS today.

## 2014-08-10 ENCOUNTER — Encounter (HOSPITAL_COMMUNITY): Payer: Self-pay | Admitting: Cardiology

## 2014-08-11 ENCOUNTER — Encounter (HOSPITAL_COMMUNITY)
Admission: RE | Admit: 2014-08-11 | Discharge: 2014-08-11 | Disposition: A | Discharge status: Home / Self Care | Payer: Medicaid Other | Source: Ambulatory Visit | Attending: Cardiology | Admitting: Cardiology

## 2014-08-11 DIAGNOSIS — Z5189 Encounter for other specified aftercare: Secondary | ICD-10-CM | POA: Diagnosis not present

## 2014-08-14 ENCOUNTER — Encounter (HOSPITAL_COMMUNITY)
Admission: RE | Admit: 2014-08-14 | Discharge: 2014-08-14 | Disposition: A | Discharge status: Home / Self Care | Payer: Medicaid Other | Source: Ambulatory Visit | Attending: Cardiology | Admitting: Cardiology

## 2014-08-14 DIAGNOSIS — Z5189 Encounter for other specified aftercare: Secondary | ICD-10-CM | POA: Diagnosis not present

## 2014-08-16 ENCOUNTER — Encounter (HOSPITAL_COMMUNITY)
Admission: RE | Admit: 2014-08-16 | Discharge: 2014-08-16 | Disposition: A | Discharge status: Home / Self Care | Payer: Medicaid Other | Source: Ambulatory Visit | Attending: Cardiology | Admitting: Cardiology

## 2014-08-16 DIAGNOSIS — Z5189 Encounter for other specified aftercare: Secondary | ICD-10-CM | POA: Diagnosis not present

## 2014-08-18 ENCOUNTER — Encounter (HOSPITAL_COMMUNITY)
Admission: RE | Admit: 2014-08-18 | Discharge: 2014-08-18 | Disposition: A | Discharge status: Home / Self Care | Payer: Medicaid Other | Source: Ambulatory Visit | Attending: Cardiology | Admitting: Cardiology

## 2014-08-18 DIAGNOSIS — Z5189 Encounter for other specified aftercare: Secondary | ICD-10-CM | POA: Diagnosis not present

## 2014-08-21 ENCOUNTER — Encounter (HOSPITAL_COMMUNITY)
Admission: RE | Admit: 2014-08-21 | Discharge: 2014-08-21 | Disposition: A | Discharge status: Home / Self Care | Payer: Medicaid Other | Source: Ambulatory Visit | Attending: Cardiology | Admitting: Cardiology

## 2014-08-21 ENCOUNTER — Encounter (HOSPITAL_COMMUNITY): Payer: Self-pay

## 2014-08-21 DIAGNOSIS — Z5189 Encounter for other specified aftercare: Secondary | ICD-10-CM | POA: Diagnosis not present

## 2014-08-23 ENCOUNTER — Encounter (HOSPITAL_COMMUNITY)
Admission: RE | Admit: 2014-08-23 | Discharge: 2014-08-23 | Disposition: A | Discharge status: Home / Self Care | Payer: Medicaid Other | Source: Ambulatory Visit | Attending: Cardiology | Admitting: Cardiology

## 2014-08-23 DIAGNOSIS — Z5189 Encounter for other specified aftercare: Secondary | ICD-10-CM | POA: Diagnosis not present

## 2014-08-23 NOTE — Progress Notes (Signed)
Pt graduated from cardiac rehab program today with completion of 29  exercise sessions in Phase II. Pt maintained good attendance and progressed nicely during his participation in rehab as evidenced by increased MET level.  Exercise limited by chest pain and knee pain.  Negative cardiac workup for chest pain.   Medication list reconciled. Repeat  PHQ score- 13, which is improved.  Pt reports continued financial stressors while awaiting disability.    Pt is working toward  lifestyle changes and should be commended for his success.  Pt will need MD encouragement to continue exercise and lifestyle modifications.  Pt feels he has achieved his goals during cardiac rehab.   Pt plans to continue exercising on his own at home walking.

## 2014-08-28 ENCOUNTER — Encounter (HOSPITAL_COMMUNITY)
Admission: RE | Admit: 2014-08-28 | Discharge: 2014-08-28 | Disposition: A | Discharge status: Home / Self Care | Payer: Medicaid Other | Source: Ambulatory Visit | Attending: Cardiology | Admitting: Cardiology

## 2014-08-28 DIAGNOSIS — Z5189 Encounter for other specified aftercare: Secondary | ICD-10-CM | POA: Diagnosis not present

## 2014-08-30 ENCOUNTER — Encounter (HOSPITAL_COMMUNITY)
Admission: RE | Admit: 2014-08-30 | Discharge: 2014-08-30 | Disposition: A | Discharge status: Home / Self Care | Payer: Medicaid Other | Source: Ambulatory Visit | Attending: Cardiology | Admitting: Cardiology

## 2014-08-30 DIAGNOSIS — Z5189 Encounter for other specified aftercare: Secondary | ICD-10-CM | POA: Diagnosis not present

## 2014-09-19 ENCOUNTER — Encounter: Payer: Self-pay | Admitting: Cardiology

## 2014-09-19 ENCOUNTER — Ambulatory Visit (INDEPENDENT_AMBULATORY_CARE_PROVIDER_SITE_OTHER): Payer: Medicaid Other | Admitting: Cardiology

## 2014-09-19 VITALS — BP 122/86 | HR 80 | Ht 67.0 in | Wt 169.9 lb

## 2014-09-19 DIAGNOSIS — E785 Hyperlipidemia, unspecified: Secondary | ICD-10-CM

## 2014-09-19 DIAGNOSIS — I10 Essential (primary) hypertension: Secondary | ICD-10-CM

## 2014-09-19 DIAGNOSIS — R0789 Other chest pain: Secondary | ICD-10-CM

## 2014-09-19 DIAGNOSIS — I2510 Atherosclerotic heart disease of native coronary artery without angina pectoris: Secondary | ICD-10-CM

## 2014-09-19 LAB — BASIC METABOLIC PANEL
BUN: 17 mg/dL (ref 6–23)
CO2: 30 mEq/L (ref 19–32)
Calcium: 9.6 mg/dL (ref 8.4–10.5)
Chloride: 102 mEq/L (ref 96–112)
Creat: 0.96 mg/dL (ref 0.50–1.35)
Glucose, Bld: 93 mg/dL (ref 70–99)
Potassium: 4.3 mEq/L (ref 3.5–5.3)
SODIUM: 138 meq/L (ref 135–145)

## 2014-09-19 LAB — HEPATIC FUNCTION PANEL
ALBUMIN: 4.5 g/dL (ref 3.5–5.2)
ALK PHOS: 57 U/L (ref 39–117)
ALT: 23 U/L (ref 0–53)
AST: 18 U/L (ref 0–37)
BILIRUBIN INDIRECT: 0.8 mg/dL (ref 0.2–1.2)
Bilirubin, Direct: 0.2 mg/dL (ref 0.0–0.3)
TOTAL PROTEIN: 7.3 g/dL (ref 6.0–8.3)
Total Bilirubin: 1 mg/dL (ref 0.2–1.2)

## 2014-09-19 LAB — LIPID PANEL
Cholesterol: 126 mg/dL (ref 0–200)
HDL: 56 mg/dL (ref >39)
LDL CALC: 54 mg/dL (ref 0–99)
Total CHOL/HDL Ratio: 2.3 Ratio
Triglycerides: 78 mg/dL (ref <150)
VLDL: 16 mg/dL (ref 0–40)

## 2014-09-19 NOTE — Patient Instructions (Signed)
We will check fasting lab work today  Continue your current therapy  I will see you in 6 months.
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
## 2014-09-25 ENCOUNTER — Ambulatory Visit (INDEPENDENT_AMBULATORY_CARE_PROVIDER_SITE_OTHER): Payer: Medicaid Other | Admitting: Family Medicine

## 2014-09-25 ENCOUNTER — Encounter: Payer: Self-pay | Admitting: Family Medicine

## 2014-09-25 ENCOUNTER — Telehealth: Payer: Self-pay | Admitting: Family Medicine

## 2014-09-25 ENCOUNTER — Ambulatory Visit
Admission: RE | Admit: 2014-09-25 | Discharge: 2014-09-25 | Disposition: A | Discharge status: Home / Self Care | Payer: Medicaid Other | Source: Ambulatory Visit | Attending: Family Medicine | Admitting: Family Medicine

## 2014-09-25 VITALS — BP 128/76 | HR 72 | Temp 97.6°F | Ht 67.0 in | Wt 166.0 lb

## 2014-09-25 DIAGNOSIS — M542 Cervicalgia: Secondary | ICD-10-CM

## 2014-09-25 DIAGNOSIS — K2270 Barrett's esophagus without dysplasia: Secondary | ICD-10-CM

## 2014-09-25 DIAGNOSIS — M179 Osteoarthritis of knee, unspecified: Secondary | ICD-10-CM

## 2014-09-25 DIAGNOSIS — Z7189 Other specified counseling: Secondary | ICD-10-CM

## 2014-09-25 DIAGNOSIS — G8929 Other chronic pain: Secondary | ICD-10-CM

## 2014-09-25 DIAGNOSIS — IMO0002 Reserved for concepts with insufficient information to code with codable children: Secondary | ICD-10-CM

## 2014-09-25 DIAGNOSIS — M1710 Unilateral primary osteoarthritis, unspecified knee: Secondary | ICD-10-CM

## 2014-09-25 MED ORDER — HYDROCODONE-ACETAMINOPHEN 5-325 MG PO TABS: 1.0000 | ORAL_TABLET | Freq: Three times a day (TID) | ORAL | Status: DC | PRN

## 2014-09-25 NOTE — Progress Notes (Signed)
   Subjective:    Patient ID: Andrew Huerta, male    DOB: 1966/04/27, 49 y.o.   MRN: [id number]  HPI  CC: follow up  # Bilateral DJD / chronic pain:  Currently stable  Taking norco 1 tablet about every 8 hours  Reads and agrees with [facility] pain medicine contract ROS: no constipation  # Chest / neck pain  Atypical chest pain, cardiologist believes maybe referred from cervical spine pain  Pain locations: left chest wall, left neck over trapezius, down into left arm  Has numbness/tingling in left arm when "pain lasts long enough"  No difficulty with head or neck turning ROS: no bowel/bladder incontinence  Review of Systems   See HPI for ROS. All other systems reviewed and are negative.  Past medical history, surgical, family, and social history reviewed and updated in the EMR as appropriate. Objective:  BP 128/76 mmHg  Pulse 72  Temp(Src) 97.6 F (36.4 C) (Oral)  Ht 5\' 7"  (1.702 m)  Wt 166 lb (75.297 kg)  BMI 25.99 kg/m2 Vitals reviewed  General: NAD Neck: Full ROM, rotation, flexion, extension causes some pain, lateral flexion okay bilaterally. Tender to palpation left trapezius, no obvious muscle tightness.  Back: nontender thoracic and lumbar spines  Assessment & Plan:  See Problem List Documentation

## 2014-09-25 NOTE — Assessment & Plan Note (Signed)
Stable. Did not tolerate tramadol titration in the past year. Refill percocet #100. UDS and pain contract signed today.

## 2014-09-25 NOTE — Assessment & Plan Note (Signed)
Concern that chest pain/neck/arm pain may be due to cervical disease. Pt does have tenderness primarily left side c-spine and trapezius. Will get plain films with flexion/extension.

## 2014-09-25 NOTE — Telephone Encounter (Signed)
Pt called and needs a referral to be seen by his [place] doctor. Can we place the referral. jw

## 2014-09-25 NOTE — Patient Instructions (Signed)
It was great seeing you today.   1. We will get your x-rays of your neck to see if there is any issue with your spine 2. Call your GI Doctor and see when you are due for routine follow up visit for your Barrett's esophagus    If we did any lab work today, if it is normal you can expect a letter in the mail.  Please bring all your medications to every doctors visit  Sign up for My Chart to have easy access to your labs results, and communication with your Primary care physician.    Please check-out at the front desk before leaving the clinic.   I look forward to talking with you again at our next visit. If you have any questions or concerns before then, please call the clinic at [phone].  Take Care,   Dr. [name]

## 2014-09-26 LAB — DRUG SCR UR, PAIN MGMT, REFLEX CONF
AMPHETAMINE SCRN UR: NEGATIVE
BENZODIAZEPINES.: NEGATIVE
Barbiturate Quant, Ur: NEGATIVE
COCAINE METABOLITES: NEGATIVE
Creatinine,U: 27.44 mg/dL
MARIJUANA METABOLITE: NEGATIVE
Methadone: NEGATIVE
Opiates: NEGATIVE
Phencyclidine (PCP): NEGATIVE
Propoxyphene: NEGATIVE

## 2014-09-26 NOTE — Telephone Encounter (Signed)
Referral order placed. Sees Dr. Watt Climes at [facility].

## 2014-09-27 ENCOUNTER — Telehealth: Payer: Self-pay | Admitting: Family Medicine

## 2014-09-27 ENCOUNTER — Telehealth: Payer: Self-pay | Admitting: Cardiology

## 2014-09-27 DIAGNOSIS — R55 Syncope and collapse: Secondary | ICD-10-CM

## 2014-09-27 NOTE — Telephone Encounter (Signed)
Pt seen in ED at [facility], discharged and placed on 30 day monitor in one of their cardiology offices same-day. I advised to wear 30 day monitor, [facility] can send records to [place] or we can view in EPIC, Dr. Martinique can review and advise to be seen sooner than ROV if indicated. Pt voiced understanding.

## 2014-09-27 NOTE — Telephone Encounter (Signed)
Calling because he was taken [facility] on yesterday for fainting and having seizures. They place a 30 day heart monitor on him and he wants to know should be come in to seer Dr. Martinique , Please call   Thanks

## 2014-09-27 NOTE — Telephone Encounter (Signed)
Will forward to MD to place referral. Amiree No,CMA  

## 2014-09-27 NOTE — Telephone Encounter (Signed)
He is indeed already established with cardiology. I made a new referral order for neurology.

## 2014-09-27 NOTE — Telephone Encounter (Signed)
Mr, Bedwell went to ED yesterday due to syncopal episodes.  Was given a heart monitor to wear for 30 day period.  Was also given name of a cardiologist they want him to see.  However he has one that he sees at the heart/vascular office on [facility].  He also need a a referral to Dr. Lynn Ito, neurologist.  Per ED provider, pt need to be seen within 2 days to these providers.

## 2014-09-29 ENCOUNTER — Encounter: Payer: Self-pay | Admitting: Family Medicine

## 2014-09-29 ENCOUNTER — Encounter: Payer: Self-pay | Admitting: *Deleted

## 2014-09-29 ENCOUNTER — Ambulatory Visit (INDEPENDENT_AMBULATORY_CARE_PROVIDER_SITE_OTHER): Payer: Medicaid Other | Admitting: Family Medicine

## 2014-09-29 VITALS — BP 112/79 | HR 78 | Temp 98.5°F | Ht 67.0 in | Wt 166.4 lb

## 2014-09-29 DIAGNOSIS — R569 Unspecified convulsions: Secondary | ICD-10-CM | POA: Insufficient documentation

## 2014-09-29 NOTE — Progress Notes (Signed)
Subjective: Andrew Huerta is a 49 y.o. male presenting for   ED follow up after being evaluated for seizure-like activity. He reports syncope with no prodrome in the waiting room of his daughter's OB/GY[facility]. Per what was reported to him, he displayed shaking/jerking movements for 20 seconds or so followed by unresponsiveness. A doctor there used smelling salts which finally got a response after several minutes. He was taken directly to the ED next door where they told him he had a seizure and needs to follow up with a neurologist soon. He had no tongue lacerations, no loss of bowel/bladder function. He has no history of seizures. Denies any recent loss of strength or numbness.   - Review of Systems: Per HPI.  Objective: BP 112/79 mmHg  Pulse 78  Temp(Src) 98.5 F (36.9 C) (Oral)  Ht 5\' 7"  (1.702 m)  Wt 166 lb 6.4 oz (75.479 kg)  BMI 26.06 kg/m2 Gen:  49 y.o. male in no distress HEENT: NCAT, eyes normal, no tongue laceration.  Neuro: Neuro: Alert and oriented x4, CN II-XII without deficits, no focal deficits in sensation or motor function. No dysmetria or dysdiadochokinesia. Fluent speech without aphasia. Gait normal. Romberg negative. No pronator drift. Assessment/Plan: Andrew Huerta is a 49 y.o. male here for new onset seizure.

## 2014-09-29 NOTE — Patient Instructions (Signed)
We will try to expedite the referral to neurology. We will keep in touch about this.   In the meantime, go to the ER if you have chest pain, shortness of breath, slurred speech, focal weakness or incoordination.   - Dr. Bonner Puna

## 2014-09-29 NOTE — Telephone Encounter (Signed)
Appointment scheduled with Dr. Watt Climes for 2/11 at 1:30pm. Tried calling to inform patient but voicemail has not been set up, letter mailed.

## 2014-10-04 ENCOUNTER — Ambulatory Visit (INDEPENDENT_AMBULATORY_CARE_PROVIDER_SITE_OTHER): Payer: Medicaid Other | Admitting: Diagnostic Neuroimaging

## 2014-10-04 ENCOUNTER — Encounter: Payer: Self-pay | Admitting: Diagnostic Neuroimaging

## 2014-10-04 VITALS — BP 118/76 | HR 75 | Ht 67.0 in | Wt 170.2 lb

## 2014-10-04 DIAGNOSIS — R55 Syncope and collapse: Secondary | ICD-10-CM

## 2014-10-04 DIAGNOSIS — R569 Unspecified convulsions: Secondary | ICD-10-CM
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
## 2014-10-04 NOTE — Progress Notes (Addendum)
[facility]  PATIENT: Andrew Huerta DOB: 07-May-1966  REFERRING CLINICIAN: Lamar Benes HISTORY FROM: patient  REASON FOR VISIT: new consult    HISTORICAL  CHIEF COMPLAINT:  Chief Complaint  Patient presents with  . New Evaluation    unspecified syncope; "passed out and had a seizure"    HISTORY OF PRESENT ILLNESS:   49 year old right-handed male with history of pheochromocytoma, severe hypertension, coronary artery disease, depression, anxiety, PTSD, here for evaluation of syncope versus seizure.  09/26/2014, he was in the waiting room of his daughter's OB/GYN, reviewing ultrasound pictures of his future grandchild, when he began crying, felt dizzy and lightheaded and stood up to leave the situation. He had sweating, lightheadedness, dizziness, passed out. He collapsed in the hallway. Bystanders were able to attend to him, get him in wheelchair, gave him a soda to drink and proceeded to transport him to the emergency room. While in the wheelchair patient had a second event, with dizziness, lightheadedness, sweating and passed out. This time he had convulsions. The second event lasted 3-4 minutes. No tongue biting or incontinence. He was snoring heavily, eyes rolled back, confused, slow to wake up.  Patient was evaluated in the emergency room with CT, MRI, lab testing, which were unremarkable. Patient was sent to cardiology office for Holter monitor placement.  Since that time patient has had no further events. No history of seizures. However for past 5-6 years, patient has had intermittent episodes of passing out, typically triggered by emotional events. Patient tends to be excessively emotional, and tends to cry easily when he watches TV shows, cartoons or experiences other life events.   Patient reports history of PTSD, related to witnessing several suicides in his family. Patient's mother has significant anxiety. Patient himself has anxiety and depression. Patient has  never seen a psychiatrist.  Patient has remote history of head trauma, falling off trampoline at age 66 years old and football injury at age 4 years old. Both of these resulted in loss of consciousness.   REVIEW OF SYSTEMS: Full 14 system review of systems performed and notable only for fatigue chest pain snoring easy bruising feeling hot feeling cold joint pain joint swelling aching muscles depression anxiety to much sleep decreased energy change in appetite snoring sleepiness restless leg seizure passing out headache memory loss.   ALLERGIES: Allergies  Allergen Reactions  . Cortisone Acetate Swelling  . Darvocet [Propoxyphene N-Acetaminophen] Nausea And Vomiting  . Nsaids Other (See Comments)    Caused stomach ulcers in the past   . Xanax [Alprazolam] Other (See Comments)    Pt states causes him to be mean    HOME MEDICATIONS: Outpatient Prescriptions Prior to Visit  Medication Sig Dispense Refill  . aspirin EC 81 MG tablet Take 81 mg by mouth daily.    Marland Kitchen atorvastatin (LIPITOR) 80 MG tablet Take 1 tablet (80 mg total) by mouth daily at 6 PM. 30 tablet 11  . esomeprazole (NEXIUM) 40 MG capsule Take 1 capsule (40 mg total) by mouth daily. 30 capsule 11  . HYDROcodone-acetaminophen (NORCO/VICODIN) 5-325 MG per tablet Take 1-2 tablets by mouth every 8 (eight) hours as needed for moderate pain. 100 tablet 0  . metoprolol tartrate (LOPRESSOR) 25 MG tablet Take 1 tablet (25 mg total) by mouth 2 (two) times daily. 60 tablet 11  . nitroGLYCERIN (NITROSTAT) 0.4 MG SL tablet Place 1 tablet (0.4 mg total) under the tongue every 5 (five) minutes as needed for chest pain (for severe chest pressure or  tightness). 25 tablet 3  . ticagrelor (BRILINTA) 90 MG TABS tablet Take 90 mg by mouth 2 (two) times daily.    Marland Kitchen acetaminophen (TYLENOL) 325 MG tablet Take 2 tablets (650 mg total) by mouth every 4 (four) hours as needed for headache or mild pain. (Patient not taking: Reported on 10/04/2014)    .  gabapentin (NEURONTIN) 300 MG capsule Take 300 mg by mouth at bedtime as needed (restless leg syndrome).     No facility-administered medications prior to visit.    PAST MEDICAL HISTORY: Past Medical History  Diagnosis Date  . Barrett's esophagus     egd - 11/27/09 - short segment of Barrett's  . Hyperlipidemia   . Anxiety   . Low back pain   . GERD (gastroesophageal reflux disease)   . Hiatal hernia     egd - 11/27/2009  . Depression   . Adrenal tumor     a. s/p adrenal gland [facility] at [facility].  Marland Kitchen Hypertension   . Sleep apnea   . Degenerative joint disease     Bilateral knees. Significant knee pain since playing football in high school.   Joaquim Lai, kidney   . CAD (coronary artery disease)     a. 04/27/14 [place] s/p DES to mLAD and DES to mLCx  . Anginal pain   . Chronic chest pain     PAST SURGICAL HISTORY: Past Surgical History  Procedure Laterality Date  . Knee arthroscopy Right X 6  . Lithotripsy  X 2  . Kidney stone surgery  X 1  . Coronary angioplasty with stent placement  04/27/2014    3.0 x 16 mm Promus DES to the mid LAD and 3.5 x 28 mm Promus to the mid LCx, otherwise 20-30 percent lesions, EF 55%  . Knee arthroplasty Right 1984  . Adrenalectomy  10/2013  . Cardiac catheterization  05/01/2014    Patent stents, other disease unchanged  . Left heart catheterization with coronary angiogram N/A 04/27/2014    Procedure: LEFT HEART CATHETERIZATION WITH CORONARY ANGIOGRAM;  Surgeon: Peter M Martinique, MD;  Location: [facility] CATH LAB;  Service: Cardiovascular;  Laterality: N/A;  . Cardiac catheterization  04/27/2014    Procedure: CORONARY STENT INTERVENTION;  Surgeon: Peter M Martinique, MD;  Location: [facility] CATH LAB;  Service: Cardiovascular;;  DES mid Cx  DES mid LAD  . Left heart catheterization with coronary angiogram N/A 05/01/2014    Procedure: LEFT HEART CATHETERIZATION WITH CORONARY ANGIOGRAM;  Surgeon: Burnell Blanks, MD;  Location: [facility] CATH LAB;  Service: Cardiovascular;   Laterality: N/A;    FAMILY HISTORY: Family History  Problem Relation Age of Onset  . Diabetes    . Stomach cancer      SOCIAL HISTORY:  History   Social History  . Marital Status: Divorced    Spouse Name: N/A    Number of Children: N/A  . Years of Education: N/A   Occupational History  . Unemployed    Social History Main Topics  . Smoking status: Never Smoker   . Smokeless tobacco: Never Used  . Alcohol Use: 1.2 oz/week    2 Shots of liquor per week  . Drug Use: No  . Sexual Activity: Yes   Other Topics Concern  . Not on file   Social History Narrative   Unemployed.    Single dad. One of his sons has ADHD.   2 grandparents died of CAD in their 21s, otherwise no family history of premature CAD.  PHYSICAL EXAM  Filed Vitals:   10/04/14 0909 10/04/14 0910  BP: 116/84 118/76  Pulse: 75 75  Height: 5\' 7"  (1.702 m)   Weight: 170 lb 3.2 oz (77.202 kg)     Body mass index is 26.65 kg/(m^2).   Visual Acuity Screening   Right eye Left eye Both eyes  Without correction:     With correction: 20/30 20/30     No flowsheet data found.  GENERAL EXAM: Patient is in no distress; well developed, nourished and groomed; neck is supple  CARDIOVASCULAR: Regular rate and rhythm, no murmurs, no carotid bruits  NEUROLOGIC: MENTAL STATUS: awake, alert, oriented to person, place and time, recent and remote memory intact, normal attention and concentration, language fluent, comprehension intact, naming intact, fund of knowledge appropriate CRANIAL NERVE: no papilledema on fundoscopic exam, pupils equal and reactive to light, visual fields full to confrontation, extraocular muscles intact, no nystagmus, facial sensation and strength symmetric, hearing intact, palate elevates symmetrically, uvula midline, shoulder shrug symmetric, tongue midline. MOTOR: normal bulk and tone, full strength in the BUE, BLE SENSORY: normal and symmetric to light touch, pinprick, temperature,  vibration COORDINATION: finger-nose-finger, fine finger movements normal REFLEXES: deep tendon reflexes present and symmetric GAIT/STATION: narrow based gait; able to walk on toes, heels and tandem; romberg is negative    DIAGNOSTIC DATA (LABS, IMAGING, TESTING) - I reviewed patient records, labs, notes, testing and imaging myself where available.  Lab Results  Component Value Date   WBC 10.0 07/25/2014   HGB 12.1* 07/25/2014   HCT 35.7* 07/25/2014   MCV 89.5 07/25/2014   PLT 186 07/25/2014      Component Value Date/Time   NA 138 09/19/2014 1510   K 4.3 09/19/2014 1510   CL 102 09/19/2014 1510   CO2 30 09/19/2014 1510   GLUCOSE 93 09/19/2014 1510   BUN 17 09/19/2014 1510   CREATININE 0.96 09/19/2014 1510   CREATININE 0.93 07/24/2014 1045   CALCIUM 9.6 09/19/2014 1510   PROT 7.3 09/19/2014 1510   ALBUMIN 4.5 09/19/2014 1510   AST 18 09/19/2014 1510   ALT 23 09/19/2014 1510   ALKPHOS 57 09/19/2014 1510   BILITOT 1.0 09/19/2014 1510   GFRNONAA >90 07/24/2014 1045   GFRAA >90 07/24/2014 1045   Lab Results  Component Value Date   CHOL 126 09/19/2014   HDL 56 09/19/2014   LDLCALC 54 09/19/2014   LDLDIRECT 169* 07/08/2011   TRIG 78 09/19/2014   CHOLHDL 2.3 09/19/2014   Lab Results  Component Value Date   HGBA1C 5.7 11/06/2011   No results found for: [id number] Lab Results  Component Value Date   TSH 0.729 07/24/2014    09/26/14 MRI brain (report only, [place]) 1. Presumed chronic small vessels ischemic white matter changes. See comments above. 2. No acute process.    ASSESSMENT AND PLAN  49 y.o. year old male here with recurrent episodes of syncope over past 5-6 years, always triggered by emotional events. Now with recent episode of similar emotional triggered syncope, followed soon after with a slightly different event, which may have been convulsive syncope. This may have been triggered by patient staying in upright position in wheelchair following the  first event. Epileptic seizure is possible but less likely.  Ddx: convulsive syncope vs seizure  PLAN: - EEG - no driving until 6 months event free - continue holter monitor - may benefit from psychiatry evaluation for PTSD, depression, anxiety, emotional lability, especially since emotional events seem to trigger syncope  Orders Placed This Encounter  Procedures  . EEG adult   Return in about 3 months (around 01/02/2015).    Penni Bombard, MD 11/01/6710, 45:80 AM Certified in Neurology, Neurophysiology and Neuroimaging  [facility] [address] [place], [place] [address] [phone]

## 2014-10-04 NOTE — Addendum Note (Signed)
Addended byAndrey Spearman on: 10/04/2014 10:40 AM   Modules accepted: Orders

## 2014-10-05 ENCOUNTER — Telehealth: Payer: Self-pay | Admitting: *Deleted

## 2014-10-05 NOTE — Telephone Encounter (Signed)
Message left requesting a call back to schedule an EEG.

## 2014-10-11 ENCOUNTER — Ambulatory Visit (INDEPENDENT_AMBULATORY_CARE_PROVIDER_SITE_OTHER): Payer: Medicaid Other | Admitting: Diagnostic Neuroimaging

## 2014-10-11 DIAGNOSIS — R569 Unspecified convulsions: Secondary | ICD-10-CM

## 2014-10-11 DIAGNOSIS — R55 Syncope and collapse: Secondary | ICD-10-CM

## 2014-10-16 ENCOUNTER — Telehealth: Payer: Self-pay | Admitting: Cardiology

## 2014-10-16 NOTE — Telephone Encounter (Signed)
He should stay on Brilinta for EGD.  Raegan Winders Martinique MD, [facility]

## 2014-10-16 NOTE — Telephone Encounter (Signed)
NOTIFIED BARB -- OF DR Martinique DECISION

## 2014-10-16 NOTE — Telephone Encounter (Signed)
SPOKE TO BARB  PATIENT IS HAVING ENDOSCOPY FOR BARRETT'S. NEED CLEARANCE - PATIENT IS ON BRILINTA WILL DEFER TO DR Martinique

## 2014-10-16 NOTE — Telephone Encounter (Signed)
She sent a fax over on Thursday for [place] for surgery on 10-20-14. Pt is on Brilinta,need to know something asap please.

## 2014-10-17 ENCOUNTER — Telehealth: Payer: Self-pay | Admitting: Cardiology

## 2014-10-17 NOTE — Telephone Encounter (Signed)
Raford Pitcher called in wanting to know how long the pt is set to be on Brilinta and in the future for an Endoscopy how long could be be off this medication to have this procedure done? Please f/u with Raford Pitcher  Thanks

## 2014-10-17 NOTE — Telephone Encounter (Signed)
Message sent to Dr.Jordan for advice. 

## 2014-10-18 NOTE — Telephone Encounter (Signed)
He will be able to stop Brilinta in August 2016.   Peter Martinique MD, [facility]

## 2014-10-19 NOTE — Telephone Encounter (Signed)
Returned call to [place] with Dr.Magod's office.Dr.Jordan advised pt will be able to stop Brilinta in August 2016.

## 2014-10-20 ENCOUNTER — Other Ambulatory Visit: Payer: Self-pay | Admitting: Gastroenterology

## 2014-10-21 ENCOUNTER — Telehealth: Payer: Self-pay | Admitting: Internal Medicine

## 2014-10-21 NOTE — Telephone Encounter (Signed)
Andrew Huerta called about a knot and a bruise. We discussed his treatment with brilinta and aspirin. He has no other signs of bleeding. He feels this may be a bigger bruise. He also mentioned some atypical arm pain. We discussed that if this continues to grow, spread or if he has other bruises he should see urgent care or ER evaluation ASAP. Otherwise, if the bruise improves, he should continue to follow it with caution. If any symptoms or worsening/changes in atypical pain persist, he knows to call 911 immediately.

## 2014-10-23 ENCOUNTER — Other Ambulatory Visit: Payer: Self-pay | Admitting: Family Medicine

## 2014-10-23 DIAGNOSIS — IMO0002 Reserved for concepts with insufficient information to code with codable children: Secondary | ICD-10-CM

## 2014-10-23 DIAGNOSIS — M1710 Unilateral primary osteoarthritis, unspecified knee: Secondary | ICD-10-CM

## 2014-10-23 NOTE — Telephone Encounter (Signed)
Needs refill on Vicodin

## 2014-10-25 NOTE — Procedures (Signed)
   [facility]  EEG (ELECTROENCEPHALOGRAM) REPORT   STUDY DATE: 10/11/14 PATIENT NAME: Andrew Huerta DOB: 1966-09-01 MRN: [id number]  ORDERING CLINICIAN: Andrey Spearman, MD   TECHNOLOGIST: Laretta Alstrom  TECHNIQUE: Electroencephalogram was recorded utilizing standard 10-20 system of lead placement and reformatted into average and bipolar montages.  RECORDING TIME: 33 minutes ACTIVATION: hyperventilation and photic stimulation  CLINICAL INFORMATION: 49 year old male with syncope vs seizure.  FINDINGS: Background rhythms of 9-10 hertz and 30-40 microvolts. No focal, lateralizing, epileptiform activity or seizures are seen. Patient recorded in the awake state.   IMPRESSION:  Normal EEG in the awake state.    INTERPRETING PHYSICIAN:  Penni Bombard, MD Certified in Neurology, Neurophysiology and Neuroimaging  [facility] [address] [place], [place] [address] [phone]

## 2014-10-27 MED ORDER — HYDROCODONE-ACETAMINOPHEN 5-325 MG PO TABS: 1.0000 | ORAL_TABLET | Freq: Three times a day (TID) | ORAL | Status: DC | PRN

## 2014-10-27 NOTE — Telephone Encounter (Signed)
Pt checking status, says he lives in [place] and will take him at least 30 minutes to get here.

## 2014-10-27 NOTE — Telephone Encounter (Signed)
Will forward to Dr. Gwendlyn Deutscher who is precepting.  PCP is out and covering provider is on post call.  Jef Futch,CMA

## 2014-10-27 NOTE — Telephone Encounter (Signed)
Pt is aware that rx was refilled by Dr. Gwendlyn Deutscher.  Enough to get him through his appt. Jazmin Hartsell,CMA

## 2014-10-31 ENCOUNTER — Ambulatory Visit (INDEPENDENT_AMBULATORY_CARE_PROVIDER_SITE_OTHER): Payer: Medicaid Other | Admitting: Family Medicine

## 2014-10-31 ENCOUNTER — Encounter: Payer: Self-pay | Admitting: Family Medicine

## 2014-10-31 VITALS — BP 115/79 | HR 81 | Temp 97.6°F | Ht 67.0 in | Wt 170.0 lb

## 2014-10-31 DIAGNOSIS — K2270 Barrett's esophagus without dysplasia: Secondary | ICD-10-CM

## 2014-10-31 DIAGNOSIS — M179 Osteoarthritis of knee, unspecified: Secondary | ICD-10-CM

## 2014-10-31 DIAGNOSIS — R569 Unspecified convulsions: Secondary | ICD-10-CM

## 2014-10-31 DIAGNOSIS — E2609 Other primary hyperaldosteronism: Secondary | ICD-10-CM

## 2014-10-31 DIAGNOSIS — M1710 Unilateral primary osteoarthritis, unspecified knee: Secondary | ICD-10-CM

## 2014-10-31 DIAGNOSIS — IMO0002 Reserved for concepts with insufficient information to code with codable children: Secondary | ICD-10-CM

## 2014-10-31 MED ORDER — OMEPRAZOLE 40 MG PO CPDR: 40.0000 mg | DELAYED_RELEASE_CAPSULE | Freq: Two times a day (BID) | ORAL | Status: DC

## 2014-10-31 MED ORDER — HYDROCODONE-ACETAMINOPHEN 5-325 MG PO TABS: 1.0000 | ORAL_TABLET | Freq: Three times a day (TID) | ORAL | Status: DC | PRN

## 2014-10-31 NOTE — Progress Notes (Signed)
   Subjective:    Patient ID: Andrew Huerta, male    DOB: 10/21/1965, 49 y.o.   MRN: [id number]  HPI  CC: f/u  # Insurance issues  Thinks he is going to lose his medicaid, has been referred to free clinic in [place] and thought he needed his medications printed out for this  Trying to reapply to medicaid?  # Seizure:  Had seizure in January while at [place] office, seen by neurologist since that time and dx'd with convulsive syncope vs seizure and underwent EEG which was normal.  No further seizure episodes. On 6 month driving privileges revoked.  # Chronic knee pain 2/2 Severe OSA  Had to get 1 refill pain medication while PCP away for vacation, attending refilled #10 to get through weekend to this appointment.  No changes in status, needs refill  # GERD / Barrett's  Already had EGD on Friday  Took biopsies but awaiting path, told it looked good  Needs refill on omeprazole. GI doctor rx 40mg  twice a day  Review of Systems   See HPI for ROS. All other systems reviewed and are negative.  Past medical history, surgical, family, and social history reviewed and updated in the EMR as appropriate. Objective:  BP 115/79 mmHg  Pulse 81  Temp(Src) 97.6 F (36.4 C) (Oral)  Ht 5\' 7"  (1.702 m)  Wt 170 lb (77.111 kg)  BMI 26.62 kg/m2 Vitals and nursing note reviewed  General: NAD CV: RRR, normal heart sounds, no murmurs. 2+ radial pulses bl Resp: clear bilaterally normal effort Ext: no edema or cyanosis  Assessment & Plan:  See Problem List Documentation

## 2014-10-31 NOTE — Patient Instructions (Signed)
After you find out about your medicaid, if you need to talk to our clinic about other arrangements call the office and ask to speak with Andrew Huerta.  If you need to transfer your medication prescriptions, speak with the pharmacies directly.

## 2014-11-01 NOTE — Assessment & Plan Note (Signed)
Had repeat EGD recently, waiting on path results. Refill omeprazole 40mg  BID.

## 2014-11-01 NOTE — Assessment & Plan Note (Signed)
>>  ASSESSMENT AND PLAN FOR BARRETT'S ESOPHAGUS WRITTEN ON 11/01/2014  2:11 PM BY LAVERNE PRENTICE HERO, MD  Had repeat EGD recently, waiting on path results. Refill omeprazole  40mg  BID.

## 2014-11-01 NOTE — Assessment & Plan Note (Signed)
Seen and evaluated by neurology. No further episodes. Not requiring AED at this time.

## 2014-11-01 NOTE — Assessment & Plan Note (Signed)
Stable. Refill percocet #90 today.

## 2014-11-01 NOTE — Assessment & Plan Note (Signed)
>>  ASSESSMENT AND PLAN FOR OTHER GASTRITIS WITH BLEEDING WRITTEN ON 06/06/2024  2:44 PM BY ELICIA HAMLET, MD   >>ASSESSMENT AND PLAN FOR BARRETT'S ESOPHAGUS WRITTEN ON 11/01/2014  2:11 PM BY LAVERNE PRENTICE HERO, MD  Had repeat EGD recently, waiting on path results. Refill omeprazole  40mg  BID.

## 2014-11-02 NOTE — Progress Notes (Signed)
I was preceptor for this office visit.  

## 2014-11-07 ENCOUNTER — Encounter: Payer: Self-pay | Admitting: Family Medicine

## 2014-11-07 ENCOUNTER — Ambulatory Visit (INDEPENDENT_AMBULATORY_CARE_PROVIDER_SITE_OTHER): Payer: Medicaid Other | Admitting: Family Medicine

## 2014-11-07 VITALS — BP 136/84 | HR 80 | Temp 98.6°F | Ht 67.0 in | Wt 171.0 lb

## 2014-11-07 DIAGNOSIS — M62830 Muscle spasm of back: Secondary | ICD-10-CM

## 2014-11-07 MED ORDER — CYCLOBENZAPRINE HCL 10 MG PO TABS: 10.0000 mg | ORAL_TABLET | Freq: Three times a day (TID) | ORAL | Status: DC | PRN

## 2014-11-07 NOTE — Progress Notes (Signed)
   Subjective:    Patient ID: Andrew Huerta, male    DOB: Sep 04, 1965, 49 y.o.   MRN: [id number]  HPI  Patient presents for Same Day Appointment  CC: back, rib pain  # Back/rib pain:  Started Thursday  Was told by girlfriend that in his sleep he made a noise as if he was hurt, but did not awaken  In the morning when he woke up he had pain in his mid and low back on the left side that went into the left rib cage  Pain feels like a tightness and sore  Denies any specific "band"-like rash or pain  Has tried heating pad, some stretches, vicodin with minimal relief ROS: no current CP, no SOB, no headache, no fevers, no rashes  Review of Systems   See HPI for ROS. All other systems reviewed and are negative.  Past medical history, surgical, family, and social history reviewed and updated in the EMR as appropriate.  Objective:  BP 136/84 mmHg  Pulse 80  Temp(Src) 98.6 F (37 C) (Oral)  Ht 5\' 7"  (1.702 m)  Wt 171 lb (77.565 kg)  BMI 26.78 kg/m2 Vitals and nursing note reviewed  General: NAD Back: no obvious deformity. Tender to palpation left side mid and low back, no point tenderness anywhere. Mild spasm appreciated of lower latissimus on the left Skin: no bruising, ecchymosis, rashes noted  Assessment & Plan:  1. Muscle spasm: likely muscle spasm. Will try conservative treatment with muscle relaxer. No red flags. No suspicion for shingles at this time. F/u if not improving 2 weeks

## 2014-11-07 NOTE — Patient Instructions (Signed)
Flexeril 10mg  up to 3 times a day for muscle spasm. This can make you tired/sleepy.  Continue with your exercises and stretches as you can tolerate. You can continue your heating pads.

## 2014-11-08 NOTE — Progress Notes (Signed)
I was preceptor for this office visit.  

## 2014-11-27 ENCOUNTER — Telehealth: Payer: Self-pay | Admitting: Family Medicine

## 2014-11-27 DIAGNOSIS — IMO0002 Reserved for concepts with insufficient information to code with codable children: Secondary | ICD-10-CM

## 2014-11-27 DIAGNOSIS — M1710 Unilateral primary osteoarthritis, unspecified knee: Secondary | ICD-10-CM

## 2014-11-27 NOTE — Telephone Encounter (Signed)
Refill request. Will forward to PCP for review. Buelah Rennie, CMA. 

## 2014-11-27 NOTE — Telephone Encounter (Signed)
Would like hydrocodone refilled please call pt when/if ready/ Fonda Kinder, ASA

## 2014-11-28 MED ORDER — HYDROCODONE-ACETAMINOPHEN 5-325 MG PO TABS: 1.0000 | ORAL_TABLET | Freq: Three times a day (TID) | ORAL | Status: DC | PRN

## 2014-11-28 NOTE — Telephone Encounter (Signed)
Prescription printed and left up front, not to be refilled before 12/01/2014. Please call patient and let them know it is available for pickup. -Dr. Lamar Benes

## 2014-11-28 NOTE — Telephone Encounter (Signed)
Pt is aware of this. Andrew Huerta,CMA  

## 2014-12-27 ENCOUNTER — Telehealth: Payer: Self-pay | Admitting: Family Medicine

## 2014-12-27 ENCOUNTER — Other Ambulatory Visit: Payer: Self-pay | Admitting: Family Medicine

## 2014-12-27 DIAGNOSIS — IMO0002 Reserved for concepts with insufficient information to code with codable children: Secondary | ICD-10-CM

## 2014-12-27 DIAGNOSIS — M1710 Unilateral primary osteoarthritis, unspecified knee: Secondary | ICD-10-CM

## 2014-12-27 MED ORDER — HYDROCODONE-ACETAMINOPHEN 5-325 MG PO TABS
1.0000 | ORAL_TABLET | Freq: Three times a day (TID) | ORAL | Status: DC | PRN
Start: 2014-12-27 — End: 2015-01-25

## 2014-12-27 NOTE — Telephone Encounter (Signed)
Refill printed and will be left up front. Please call pt and let him know it is available for pickup -Dr. Lamar Benes

## 2014-12-27 NOTE — Telephone Encounter (Signed)
Needs refill on vicodan

## 2014-12-28 NOTE — Telephone Encounter (Signed)
Patient is aware that his prescriptions are up front for pick up. Jazmin Hartsell,CMA

## 2015-01-12 ENCOUNTER — Emergency Department (HOSPITAL_COMMUNITY)
Admission: EM | Admit: 2015-01-12 | Discharge: 2015-01-13 | Disposition: A | Discharge status: Home / Self Care | Payer: Medicaid Other | Attending: Emergency Medicine | Admitting: Emergency Medicine

## 2015-01-12 ENCOUNTER — Emergency Department (HOSPITAL_COMMUNITY): Payer: Medicaid Other

## 2015-01-12 ENCOUNTER — Encounter (HOSPITAL_COMMUNITY): Payer: Self-pay | Admitting: Emergency Medicine

## 2015-01-12 DIAGNOSIS — G8929 Other chronic pain: Secondary | ICD-10-CM | POA: Insufficient documentation

## 2015-01-12 DIAGNOSIS — I10 Essential (primary) hypertension: Secondary | ICD-10-CM | POA: Diagnosis not present

## 2015-01-12 DIAGNOSIS — K219 Gastro-esophageal reflux disease without esophagitis: Secondary | ICD-10-CM | POA: Diagnosis not present

## 2015-01-12 DIAGNOSIS — Z86018 Personal history of other benign neoplasm: Secondary | ICD-10-CM | POA: Insufficient documentation

## 2015-01-12 DIAGNOSIS — Z7982 Long term (current) use of aspirin: Secondary | ICD-10-CM | POA: Insufficient documentation

## 2015-01-12 DIAGNOSIS — N390 Urinary tract infection, site not specified: Secondary | ICD-10-CM | POA: Insufficient documentation

## 2015-01-12 DIAGNOSIS — E785 Hyperlipidemia, unspecified: Secondary | ICD-10-CM | POA: Diagnosis not present

## 2015-01-12 DIAGNOSIS — N200 Calculus of kidney: Secondary | ICD-10-CM | POA: Diagnosis not present

## 2015-01-12 DIAGNOSIS — I25119 Atherosclerotic heart disease of native coronary artery with unspecified angina pectoris: Secondary | ICD-10-CM | POA: Diagnosis not present

## 2015-01-12 DIAGNOSIS — Z79899 Other long term (current) drug therapy: Secondary | ICD-10-CM | POA: Diagnosis not present

## 2015-01-12 DIAGNOSIS — R319 Hematuria, unspecified: Secondary | ICD-10-CM

## 2015-01-12 DIAGNOSIS — R109 Unspecified abdominal pain: Secondary | ICD-10-CM | POA: Diagnosis present

## 2015-01-12 DIAGNOSIS — F419 Anxiety disorder, unspecified: Secondary | ICD-10-CM | POA: Insufficient documentation

## 2015-01-12 DIAGNOSIS — Z7902 Long term (current) use of antithrombotics/antiplatelets: Secondary | ICD-10-CM | POA: Diagnosis not present

## 2015-01-12 LAB — PROTIME-INR
INR: 1.04 (ref 0.00–1.49)
Prothrombin Time: 13.7 seconds (ref 11.6–15.2)

## 2015-01-12 LAB — COMPREHENSIVE METABOLIC PANEL
ALBUMIN: 4.4 g/dL (ref 3.5–5.0)
ALT: 29 U/L (ref 17–63)
ANION GAP: 14 (ref 5–15)
AST: 25 U/L (ref 15–41)
Alkaline Phosphatase: 60 U/L (ref 38–126)
BILIRUBIN TOTAL: 0.7 mg/dL (ref 0.3–1.2)
BUN: 19 mg/dL (ref 6–20)
CHLORIDE: 104 mmol/L (ref 101–111)
CO2: 21 mmol/L — AB (ref 22–32)
Calcium: 9.3 mg/dL (ref 8.9–10.3)
Creatinine, Ser: 1.3 mg/dL — ABNORMAL HIGH (ref 0.61–1.24)
GFR calc Af Amer: >60 mL/min (ref >60)
GFR calc non Af Amer: >60 mL/min (ref >60)
GLUCOSE: 110 mg/dL — AB (ref 65–99)
Potassium: 3.7 mmol/L (ref 3.5–5.1)
Sodium: 139 mmol/L (ref 135–145)
TOTAL PROTEIN: 7.5 g/dL (ref 6.5–8.1)

## 2015-01-12 LAB — TYPE AND SCREEN
ABO/RH(D): A POS
Antibody Screen: NEGATIVE

## 2015-01-12 LAB — URINE MICROSCOPIC-ADD ON

## 2015-01-12 LAB — CBC WITH DIFFERENTIAL/PLATELET
Basophils Absolute: 0 10*3/uL (ref 0.0–0.1)
Basophils Relative: 0 % (ref 0–1)
EOS ABS: 0.2 10*3/uL (ref 0.0–0.7)
EOS PCT: 1 % (ref 0–5)
HCT: 38.6 % — ABNORMAL LOW (ref 39.0–52.0)
HEMOGLOBIN: 13.2 g/dL (ref 13.0–17.0)
LYMPHS ABS: 5.2 10*3/uL — AB (ref 0.7–4.0)
Lymphocytes Relative: 35 % (ref 12–46)
MCH: 30.2 pg (ref 26.0–34.0)
MCHC: 34.2 g/dL (ref 30.0–36.0)
MCV: 88.3 fL (ref 78.0–100.0)
Monocytes Absolute: 0.8 10*3/uL (ref 0.1–1.0)
Monocytes Relative: 5 % (ref 3–12)
NEUTROS ABS: 8.6 10*3/uL — AB (ref 1.7–7.7)
Neutrophils Relative %: 59 % (ref 43–77)
Platelets: 223 10*3/uL (ref 150–400)
RBC: 4.37 MIL/uL (ref 4.22–5.81)
RDW: 13.2 % (ref 11.5–15.5)
WBC: 14.8 10*3/uL — ABNORMAL HIGH (ref 4.0–10.5)

## 2015-01-12 LAB — OCCULT BLOOD GASTRIC / DUODENUM (SPECIMEN CUP): OCCULT BLOOD, GASTRIC: POSITIVE — AB

## 2015-01-12 LAB — URINALYSIS, ROUTINE W REFLEX MICROSCOPIC
Glucose, UA: NEGATIVE mg/dL
KETONES UR: 15 mg/dL — AB
NITRITE: NEGATIVE
PH: 5 (ref 5.0–8.0)
Protein, ur: 100 mg/dL — AB
SPECIFIC GRAVITY, URINE: 1.028 (ref 1.005–1.030)
Urobilinogen, UA: 1 mg/dL (ref 0.0–1.0)

## 2015-01-12 LAB — POC OCCULT BLOOD, ED: Fecal Occult Bld: NEGATIVE

## 2015-01-12 LAB — APTT: aPTT: 35 seconds (ref 24–37)

## 2015-01-12 LAB — LIPASE, BLOOD: Lipase: 61 U/L — ABNORMAL HIGH (ref 22–51)

## 2015-01-12 MED ORDER — HYDROMORPHONE HCL 1 MG/ML IJ SOLN
1.0000 mg | Freq: Once | INTRAMUSCULAR | Status: AC
  Administered 2015-01-12: 1 mg via INTRAVENOUS
  Filled 2015-01-12: qty 1

## 2015-01-12 MED ORDER — SODIUM CHLORIDE 0.9 % IV BOLUS (SEPSIS)
1000.0000 mL | Freq: Once | INTRAVENOUS | Status: AC
  Administered 2015-01-12: 1000 mL via INTRAVENOUS

## 2015-01-12 MED ORDER — ONDANSETRON HCL 4 MG/2ML IJ SOLN
4.0000 mg | Freq: Once | INTRAMUSCULAR | Status: AC
  Administered 2015-01-12: 4 mg via INTRAVENOUS
  Filled 2015-01-12: qty 2

## 2015-01-12 NOTE — ED Provider Notes (Addendum)
TIME SEEN: 2245  CHIEF COMPLAINT: Flank pain, abdominal pain, vomiting  HPI: Pt is a 49 y.o. male with history of Barrett's esophagus, hiatal hernia, hyperlipidemia, anxiety, kidney stones who presents to the emergency department with complaints of left flank pain that radiates into his left lower abdomen and left groin region that started earlier today with nausea and vomiting. He states he has noticed red tinge to his vomit and is concerned he is vomiting blood. No coffee-ground emesis. States he has noticed some "dark" stools. Unsure if this is melena. Denies hematochezia. No fever. No sick contacts. No diarrhea. Has had some dysuria. Also noticed hematuria. He is unsure if this feels like his prior kidney stones.  ROS: See HPI Constitutional: no fever  Eyes: no drainage  ENT: no runny nose   Cardiovascular:  no chest pain  Resp: no SOB  GI:  vomiting GU: no dysuria Integumentary: no rash  Allergy: no hives  Musculoskeletal: no leg swelling  Neurological: no slurred speech ROS otherwise negative  PAST MEDICAL HISTORY/PAST SURGICAL HISTORY:  Past Medical History  Diagnosis Date  . Barrett's esophagus     egd - 11/27/09 - short segment of Barrett's  . Hyperlipidemia   . Anxiety   . Low back pain   . GERD (gastroesophageal reflux disease)   . Hiatal hernia     egd - 11/27/2009  . Depression   . Adrenal tumor     a. s/p adrenal gland resection at [facility].  Marland Kitchen Hypertension   . Sleep apnea   . Degenerative joint disease     Bilateral knees. Significant knee pain since playing football in high school.   Joaquim Lai, kidney   . CAD (coronary artery disease)     a. 04/27/14 [place] s/p DES to mLAD and DES to mLCx  . Anginal pain   . Chronic chest pain     MEDICATIONS:  Prior to Admission medications   Medication Sig Start Date End Date Taking? Authorizing Provider  acetaminophen (TYLENOL) 325 MG tablet Take 2 tablets (650 mg total) by mouth every 4 (four) hours as needed for headache or  mild pain. Patient not taking: Reported on 10/04/2014 07/25/14   Erlene Quan, PA-C  aspirin EC 81 MG tablet Take 81 mg by mouth daily.    Historical Provider, MD  atorvastatin (LIPITOR) 80 MG tablet Take 1 tablet (80 mg total) by mouth daily at 6 PM. 04/28/14   Eileen Stanford, PA-C  cyclobenzaprine (FLEXERIL) 10 MG tablet Take 1 tablet (10 mg total) by mouth 3 (three) times daily as needed for muscle spasms. 11/07/14   Leone Brand, MD  gabapentin (NEURONTIN) 300 MG capsule Take 300 mg by mouth at bedtime as needed (restless leg syndrome).    Historical Provider, MD  HYDROcodone-acetaminophen (NORCO/VICODIN) 5-325 MG per tablet Take 1-2 tablets by mouth every 8 (eight) hours as needed for moderate pain. Do not fill until 12/31/2014 12/27/14   Leone Brand, MD  metoprolol tartrate (LOPRESSOR) 25 MG tablet Take 1 tablet (25 mg total) by mouth 2 (two) times daily. 04/28/14   Eileen Stanford, PA-C  nitroGLYCERIN (NITROSTAT) 0.4 MG SL tablet Place 1 tablet (0.4 mg total) under the tongue every 5 (five) minutes as needed for chest pain (for severe chest pressure or tightness). 07/25/14   Erlene Quan, PA-C  omeprazole (PRILOSEC) 40 MG capsule Take 1 capsule (40 mg total) by mouth 2 (two) times daily. 10/31/14   Leone Brand, MD  ticagrelor (BRILINTA) 90 MG TABS tablet Take 90 mg by mouth 2 (two) times daily.    Historical Provider, MD    ALLERGIES:  Allergies  Allergen Reactions  . Cortisone Acetate Swelling  . Darvocet [Propoxyphene N-Acetaminophen] Nausea And Vomiting  . Nsaids Other (See Comments)    Caused stomach ulcers in the past   . Xanax [Alprazolam] Other (See Comments)    Pt states causes him to be mean    SOCIAL HISTORY:  History  Substance Use Topics  . Smoking status: Never Smoker   . Smokeless tobacco: Never Used  . Alcohol Use: 1.2 oz/week    2 Shots of liquor per week    FAMILY HISTORY: Family History  Problem Relation Age of Onset  . Diabetes    . Stomach cancer       EXAM: BP 150/109 mmHg  Pulse 104  Temp(Src) 98 F (36.7 C) (Oral)  Resp 18  Ht 5\' 7"  (1.702 m)  Wt 169 lb (76.658 kg)  BMI 26.46 kg/m2  SpO2 98% CONSTITUTIONAL: Alert and oriented and responds appropriately to questions. Appears very uncomfortable but nontoxic HEAD: Normocephalic EYES: Conjunctivae clear, PERRL ENT: normal nose; no rhinorrhea; moist mucous membranes; pharynx without lesions noted NECK: Supple, no meningismus, no LAD  CARD: RRR; S1 and S2 appreciated; no murmurs, no clicks, no rubs, no gallops RESP: Normal chest excursion without splinting or tachypnea; breath sounds clear and equal bilaterally; no wheezes, no rhonchi, no rales, no hypoxia or respiratory distress, speaking full sentences ABD/GI: Normal bowel sounds; non-distended; soft, tender to palpation in the left lower quadrant, no rebound, no guarding, no peritoneal signs GU:  No testicular pain or masses, no scrotal masses, no hernia, 2+ femoral pulses bilaterally, circumcised male with normal penis, normal urethral meatus without blood or discharge. Normal rectal tone, no gross blood or melena, no hemorrhoids RECTAL:   BACK:  The back appears normal and is non-tender to palpation, there is no CVA tenderness EXT: Normal ROM in all joints; non-tender to palpation; no edema; normal capillary refill; no cyanosis, no calf tenderness or swelling    SKIN: Normal color for age and race; warm, diaphoretic NEURO: Moves all extremities equally, sensation to light touch intact diffusely, cranial nerves II through XII intact PSYCH: The patient's mood and manner are appropriate. Grooming and personal hygiene are appropriate.  MEDICAL DECISION MAKING: Pt here with left flank pain radiating to the left abdomen. Suspect kidney stone but patient states he has never had abdominal pain with his kidney stones before. Diverticulitis is also on the differential as well as colitis. We'll obtain labs, urine, CT of his abdomen and  pelvis. We'll give IV fluids, as Dilaudid, Zofran. He is vomiting with slight red tinge. Gastric occult is positive but hemoglobin is 13.2. This may be from irritation from multiple episodes of vomiting. No coffee-ground emesis. Fecal occult is negative. Will continue to closely monitor.   ED PROGRESS: Patient's urine shows hematuria, leukocytes and many bacteria. We'll send culture but will also treat for possible infection. He has mild leukocytosis with left shift. Slightly bumped creatinine at 1.3 but has received 2 L of IV fluids in the ED. Initially felt like he could not urinate but has been able to urinate M.D. his bladder without difficulty. No urinary retention. CT scan shows moderate hydronephrosis but he has a stone that is 3 mm at the UVJ versus recently passed into the bladder. I feel he is safe to be discharged home. We'll discharge with  Percocet, Keflex, Phenergan. Discussed return precautions. He verbalizes understanding and is comfortable with plan.     [place], DO 01/13/15 0047   Pain controlled after one dose of Dilaudid. Vomiting stopped after Zofran. Tolerating by mouth without difficulty.  [place], DO 01/13/15 [phone]

## 2015-01-12 NOTE — ED Notes (Signed)
C/o intermittent lower abd pain that radiates to back with nausea and vomiting x 2 days.  Denies diarrhea.  C/o dysuria and hematuria.

## 2015-01-13 LAB — ABO/RH: ABO/RH(D): A POS

## 2015-01-13 MED ORDER — PROMETHAZINE HCL 25 MG PO TABS: 25.0000 mg | ORAL_TABLET | Freq: Four times a day (QID) | ORAL | Status: DC | PRN

## 2015-01-13 MED ORDER — DEXTROSE 5 % IV SOLN
1.0000 g | Freq: Once | INTRAVENOUS | Status: AC
  Administered 2015-01-13: 1 g via INTRAVENOUS
  Filled 2015-01-13: qty 10

## 2015-01-13 MED ORDER — OXYCODONE-ACETAMINOPHEN 5-325 MG PO TABS: 1.0000 | ORAL_TABLET | ORAL | Status: DC | PRN

## 2015-01-13 MED ORDER — CEPHALEXIN 500 MG PO CAPS: 500.0000 mg | ORAL_CAPSULE | Freq: Two times a day (BID) | ORAL | Status: DC

## 2015-01-13 MED ORDER — SODIUM CHLORIDE 0.9 % IV BOLUS (SEPSIS)
1000.0000 mL | Freq: Once | INTRAVENOUS | Status: AC
Start: 2015-01-13 — End: 2015-01-13
  Administered 2015-01-13: 1000 mL via INTRAVENOUS

## 2015-01-13 NOTE — Discharge Instructions (Signed)
Kidney Stones  Kidney stones (urolithiasis) are deposits that form inside your kidneys. The intense pain is caused by the stone moving through the urinary tract. When the stone moves, the ureter goes into spasm around the stone. The stone is usually passed in the urine.   CAUSES   · A disorder that makes certain neck glands produce too much parathyroid hormone (primary hyperparathyroidism).  · A buildup of uric acid crystals, similar to gout in your joints.  · Narrowing (stricture) of the ureter.  · A kidney obstruction present at birth (congenital obstruction).  · Previous surgery on the kidney or ureters.  · Numerous kidney infections.  SYMPTOMS   · Feeling sick to your stomach (nauseous).  · Throwing up (vomiting).  · Blood in the urine (hematuria).  · Pain that usually spreads (radiates) to the groin.  · Frequency or urgency of urination.  DIAGNOSIS   · Taking a history and physical exam.  · Blood or urine tests.  · CT scan.  · Occasionally, an examination of the inside of the urinary bladder (cystoscopy) is performed.  TREATMENT   · Observation.  · Increasing your fluid intake.  · Extracorporeal shock wave lithotripsy--This is a noninvasive procedure that uses shock waves to break up kidney stones.  · Surgery may be needed if you have severe pain or persistent obstruction. There are various surgical procedures. Most of the procedures are performed with the use of small instruments. Only small incisions are needed to accommodate these instruments, so recovery time is minimized.  The size, location, and chemical composition are all important variables that will determine the proper choice of action for you. Talk to your health care provider to better understand your situation so that you will minimize the risk of injury to yourself and your kidney.   HOME CARE INSTRUCTIONS   · Drink enough water and fluids to keep your urine clear or pale yellow. This will help you to pass the stone or stone fragments.  · Strain  all urine through the provided strainer. Keep all particulate matter and stones for your health care provider to see. The stone causing the pain may be as small as a grain of salt. It is very important to use the strainer each and every time you pass your urine. The collection of your stone will allow your health care provider to analyze it and verify that a stone has actually passed. The stone analysis will often identify what you can do to reduce the incidence of recurrences.  · Only take over-the-counter or prescription medicines for pain, discomfort, or fever as directed by your health care provider.  · Make a follow-up appointment with your health care provider as directed.  · Get follow-up X-rays if required. The absence of pain does not always mean that the stone has passed. It may have only stopped moving. If the urine remains completely obstructed, it can cause loss of kidney function or even complete destruction of the kidney. It is your responsibility to make sure X-rays and follow-ups are completed. Ultrasounds of the kidney can show blockages and the status of the kidney. Ultrasounds are not associated with any radiation and can be performed easily in a matter of minutes.  SEEK MEDICAL CARE IF:  · You experience pain that is progressive and unresponsive to any pain medicine you have been prescribed.  SEEK IMMEDIATE MEDICAL CARE IF:   · Pain cannot be controlled with the prescribed medicine.  · You have a fever or   shaking chills.  · The severity or intensity of pain increases over 18 hours and is not relieved by pain medicine.  · You develop a new onset of abdominal pain.  · You feel faint or pass out.  · You are unable to urinate.  MAKE SURE YOU:   · Understand these instructions.  · Will watch your condition.  · Will get help right away if you are not doing well or get worse.  Document Released: 08/18/2005 Document Revised: 04/20/2013 Document Reviewed: 01/19/2013  ExitCare® Patient Information ©2015  [employer]. This information is not intended to replace advice given to you by your health care provider. Make sure you discuss any questions you have with your health care provider.    Dietary Guidelines to Help Prevent Kidney Stones  Your risk of kidney stones can be decreased by adjusting the foods you eat. The most important thing you can do is drink enough fluid. You should drink enough fluid to keep your urine clear or pale yellow. The following guidelines provide specific information for the type of kidney stone you have had.  GUIDELINES ACCORDING TO TYPE OF KIDNEY STONE  Calcium Oxalate Kidney Stones  · Reduce the amount of salt you eat. Foods that have a lot of salt cause your body to release excess calcium into your urine. The excess calcium can combine with a substance called oxalate to form kidney stones.  · Reduce the amount of animal protein you eat if the amount you eat is excessive. Animal protein causes your body to release excess calcium into your urine. Ask your dietitian how much protein from animal sources you should be eating.  · Avoid foods that are high in oxalates. If you take vitamins, they should have less than 500 mg of vitamin C. Your body turns vitamin C into oxalates. You do not need to avoid fruits and vegetables high in vitamin C.  Calcium Phosphate Kidney Stones  · Reduce the amount of salt you eat to help prevent the release of excess calcium into your urine.  · Reduce the amount of animal protein you eat if the amount you eat is excessive. Animal protein causes your body to release excess calcium into your urine. Ask your dietitian how much protein from animal sources you should be eating.  · Get enough calcium from food or take a calcium supplement (ask your dietitian for recommendations). Food sources of calcium that do not increase your risk of kidney stones include:  ¨ Broccoli.  ¨ Dairy products, such as cheese and yogurt.  ¨ Pudding.  Uric Acid Kidney Stones  · Do not  have more than 6 oz of animal protein per day.  FOOD SOURCES  Animal Protein Sources  · Meat (all types).  · Poultry.  · Eggs.  · Fish, seafood.  Foods High in Salt  · Salt seasonings.  · Soy sauce.  · Teriyaki sauce.  · Cured and processed meats.  · Salted crackers and snack foods.  · Fast food.  · Canned soups and most canned foods.  Foods High in Oxalates  · Grains:  ¨ Amaranth.  ¨ Barley.  ¨ Grits.  ¨ Wheat germ.  ¨ Bran.  ¨ Buckwheat flour.  ¨ All bran cereals.  ¨ Pretzels.  ¨ Whole wheat bread.  · Vegetables:  ¨ Beans (wax).  ¨ Beets and beet greens.  ¨ Collard greens.  ¨ Eggplant.  ¨ Escarole.  ¨ Leeks.  ¨ Okra.  ¨ Parsley.  ¨ Rutabagas.  ¨   Spinach.  ¨ Swiss chard.  ¨ Tomato paste.  ¨ Fried potatoes.  ¨ Sweet potatoes.  · Fruits:  ¨ Red currants.  ¨ Figs.  ¨ Kiwi.  ¨ Rhubarb.  · Meat and Other Protein Sources:  ¨ Beans (dried).  ¨ Soy burgers and other soybean products.  ¨ Miso.  ¨ Nuts (peanuts, almonds, pecans, cashews, hazelnuts).  ¨ Nut butters.  ¨ Sesame seeds and tahini (paste made of sesame seeds).  ¨ Poppy seeds.  · Beverages:  ¨ Chocolate drink mixes.  ¨ Soy milk.  ¨ Instant iced tea.  ¨ Juices made from high-oxalate fruits or vegetables.  · Other:  ¨ Carob.  ¨ Chocolate.  ¨ Fruitcake.  ¨ Marmalades.  Document Released: 12/13/2010 Document Revised: 08/23/2013 Document Reviewed: 07/15/2013  ExitCare® Patient Information ©2015 [employer]. This information is not intended to replace advice given to you by your health care provider. Make sure you discuss any questions you have with your health care provider.

## 2015-01-13 NOTE — ED Notes (Signed)
Pt. Left with all belongings and refused wheelchair 

## 2015-01-13 NOTE — ED Notes (Signed)
240 cc of water given  

## 2015-01-14 LAB — URINE CULTURE
Colony Count: NO GROWTH
Culture: NO GROWTH

## 2015-01-15 ENCOUNTER — Telehealth: Payer: Self-pay | Admitting: Cardiology

## 2015-01-15 NOTE — Telephone Encounter (Signed)
Patient is requesting samples of Brilanta.

## 2015-01-15 NOTE — Telephone Encounter (Signed)
Patient aware samples are at the front desk for pick up  

## 2015-01-19 ENCOUNTER — Ambulatory Visit: Payer: Self-pay | Admitting: Diagnostic Neuroimaging

## 2015-01-25 ENCOUNTER — Other Ambulatory Visit: Payer: Self-pay | Admitting: Family Medicine

## 2015-01-25 DIAGNOSIS — IMO0002 Reserved for concepts with insufficient information to code with codable children: Secondary | ICD-10-CM

## 2015-01-25 DIAGNOSIS — M1710 Unilateral primary osteoarthritis, unspecified knee: Secondary | ICD-10-CM

## 2015-01-25 NOTE — Telephone Encounter (Signed)
Mr. Amborn is calling because he needs a refill on his HYDROcodone-acetaminophen (NORCO/VICODIN) 5-325. Please contact Mr. Simien once completed, if it can be completed. Thank you, Fonda Kinder, ASA

## 2015-01-26 MED ORDER — HYDROCODONE-ACETAMINOPHEN 5-325 MG PO TABS: 1.0000 | ORAL_TABLET | Freq: Three times a day (TID) | ORAL | Status: DC | PRN

## 2015-01-26 NOTE — Telephone Encounter (Signed)
Rx printed and left up front, please call and let him know it is available. Thank you. -Dr. Lamar Benes

## 2015-01-26 NOTE — Telephone Encounter (Signed)
Pt is aware.  Andrew Huerta,CMA  

## 2015-02-02 ENCOUNTER — Telehealth: Payer: Self-pay | Admitting: Cardiology

## 2015-02-02 NOTE — Telephone Encounter (Signed)
Spoke with patient, will leave [place] paperwork for patient assistance at front desk with samples

## 2015-02-02 NOTE — Telephone Encounter (Signed)
Pt would like some samples of Brilinta. He only have enough until Sunday,

## 2015-02-02 NOTE — Telephone Encounter (Signed)
Returned call to patient he stated he has no insurance.Stated his son just turned 49 yrs old and he lost Medicaid.Stated he is trying to get insurance.Brilinta 90 mg samples left at front desk.I will send message to our pharmacist Erasmo Downer for patient assistance program.

## 2015-02-26 ENCOUNTER — Telehealth: Payer: Self-pay | Admitting: Family Medicine

## 2015-02-26 DIAGNOSIS — IMO0002 Reserved for concepts with insufficient information to code with codable children: Secondary | ICD-10-CM

## 2015-02-26 DIAGNOSIS — M1710 Unilateral primary osteoarthritis, unspecified knee: Secondary | ICD-10-CM

## 2015-02-26 MED ORDER — HYDROCODONE-ACETAMINOPHEN 5-325 MG PO TABS: 1.0000 | ORAL_TABLET | Freq: Three times a day (TID) | ORAL | Status: DC | PRN

## 2015-02-26 NOTE — Telephone Encounter (Signed)
Pt called and needs a refill on his Vicodin.Marland Kitchen jw

## 2015-02-26 NOTE — Telephone Encounter (Signed)
Refill request from pt. Will forward to PCP for review. Viktoriya Glaspy, CMA. 

## 2015-02-26 NOTE — Telephone Encounter (Signed)
Rx will be left at front desk. Please call pt and let them know. -Dr. Lamar Benes

## 2015-02-26 NOTE — Telephone Encounter (Signed)
Pt advised. Tryson Lumley, CMA.

## 2015-03-02 ENCOUNTER — Telehealth: Payer: Self-pay | Admitting: Cardiology

## 2015-03-02 NOTE — Telephone Encounter (Signed)
Returned call to patient Brilinta 90 mg samples left at front desk of [facility] office.

## 2015-03-02 NOTE — Telephone Encounter (Signed)
Patient calling the office for samples of medication:   1.  What medication and dosage are you requesting samples for Brilinta  2.  Are you currently out of this medication? No- 2 days left 3. Are you requesting samples to get you through until a mail order prescription arrives?no

## 2015-03-21 ENCOUNTER — Observation Stay (HOSPITAL_COMMUNITY)
Admission: EM | Admit: 2015-03-21 | Discharge: 2015-03-23 | Disposition: A | Discharge status: Home / Self Care | Payer: Medicaid Other | Attending: Cardiology | Admitting: Cardiology

## 2015-03-21 ENCOUNTER — Emergency Department (HOSPITAL_COMMUNITY): Payer: Medicaid Other

## 2015-03-21 ENCOUNTER — Encounter (HOSPITAL_COMMUNITY): Payer: Self-pay | Admitting: Family Medicine

## 2015-03-21 DIAGNOSIS — I10 Essential (primary) hypertension: Secondary | ICD-10-CM | POA: Diagnosis present

## 2015-03-21 DIAGNOSIS — I25119 Atherosclerotic heart disease of native coronary artery with unspecified angina pectoris: Secondary | ICD-10-CM

## 2015-03-21 DIAGNOSIS — E785 Hyperlipidemia, unspecified: Secondary | ICD-10-CM | POA: Insufficient documentation

## 2015-03-21 DIAGNOSIS — K2270 Barrett's esophagus without dysplasia: Secondary | ICD-10-CM | POA: Diagnosis not present

## 2015-03-21 DIAGNOSIS — M170 Bilateral primary osteoarthritis of knee: Secondary | ICD-10-CM | POA: Insufficient documentation

## 2015-03-21 DIAGNOSIS — K219 Gastro-esophageal reflux disease without esophagitis: Secondary | ICD-10-CM | POA: Insufficient documentation

## 2015-03-21 DIAGNOSIS — IMO0002 Reserved for concepts with insufficient information to code with codable children: Secondary | ICD-10-CM | POA: Diagnosis present

## 2015-03-21 DIAGNOSIS — R079 Chest pain, unspecified: Secondary | ICD-10-CM | POA: Diagnosis present

## 2015-03-21 DIAGNOSIS — K22711 Barrett's esophagus with high grade dysplasia: Secondary | ICD-10-CM | POA: Diagnosis present

## 2015-03-21 DIAGNOSIS — Z7982 Long term (current) use of aspirin: Secondary | ICD-10-CM | POA: Diagnosis not present

## 2015-03-21 DIAGNOSIS — R072 Precordial pain: Secondary | ICD-10-CM | POA: Diagnosis not present

## 2015-03-21 DIAGNOSIS — Z792 Long term (current) use of antibiotics: Secondary | ICD-10-CM | POA: Insufficient documentation

## 2015-03-21 DIAGNOSIS — I2510 Atherosclerotic heart disease of native coronary artery without angina pectoris: Secondary | ICD-10-CM | POA: Diagnosis present

## 2015-03-21 DIAGNOSIS — Z79891 Long term (current) use of opiate analgesic: Secondary | ICD-10-CM | POA: Insufficient documentation

## 2015-03-21 DIAGNOSIS — F329 Major depressive disorder, single episode, unspecified: Secondary | ICD-10-CM | POA: Insufficient documentation

## 2015-03-21 DIAGNOSIS — E1169 Type 2 diabetes mellitus with other specified complication: Secondary | ICD-10-CM | POA: Diagnosis present

## 2015-03-21 DIAGNOSIS — R0602 Shortness of breath: Secondary | ICD-10-CM | POA: Insufficient documentation

## 2015-03-21 DIAGNOSIS — Z955 Presence of coronary angioplasty implant and graft: Secondary | ICD-10-CM

## 2015-03-21 DIAGNOSIS — Z79899 Other long term (current) drug therapy: Secondary | ICD-10-CM | POA: Diagnosis not present

## 2015-03-21 DIAGNOSIS — M1710 Unilateral primary osteoarthritis, unspecified knee: Secondary | ICD-10-CM | POA: Diagnosis present

## 2015-03-21 DIAGNOSIS — R0789 Other chest pain: Secondary | ICD-10-CM

## 2015-03-21 DIAGNOSIS — G4730 Sleep apnea, unspecified: Secondary | ICD-10-CM | POA: Insufficient documentation

## 2015-03-21 LAB — CBC
HCT: 38.4 % — ABNORMAL LOW (ref 39.0–52.0)
Hemoglobin: 13.1 g/dL (ref 13.0–17.0)
MCH: 30.5 pg (ref 26.0–34.0)
MCHC: 34.1 g/dL (ref 30.0–36.0)
MCV: 89.3 fL (ref 78.0–100.0)
Platelets: 222 10*3/uL (ref 150–400)
RBC: 4.3 MIL/uL (ref 4.22–5.81)
RDW: 13.5 % (ref 11.5–15.5)
WBC: 10.5 10*3/uL (ref 4.0–10.5)

## 2015-03-21 LAB — BASIC METABOLIC PANEL
Anion gap: 9 (ref 5–15)
BUN: 18 mg/dL (ref 6–20)
CO2: 24 mmol/L (ref 22–32)
CREATININE: 1.11 mg/dL (ref 0.61–1.24)
Calcium: 9.3 mg/dL (ref 8.9–10.3)
Chloride: 107 mmol/L (ref 101–111)
GLUCOSE: 104 mg/dL — AB (ref 65–99)
Potassium: 3.9 mmol/L (ref 3.5–5.1)
Sodium: 140 mmol/L (ref 135–145)

## 2015-03-21 LAB — I-STAT TROPONIN, ED: Troponin i, poc: 0 ng/mL (ref 0.00–0.08)

## 2015-03-21 MED ORDER — METOPROLOL TARTRATE 25 MG PO TABS
25.0000 mg | ORAL_TABLET | Freq: Two times a day (BID) | ORAL | Status: DC
  Administered 2015-03-21 – 2015-03-23 (×3): 25 mg via ORAL
  Filled 2015-03-21 (×3): qty 1

## 2015-03-21 MED ORDER — ASPIRIN EC 81 MG PO TBEC
81.0000 mg | DELAYED_RELEASE_TABLET | Freq: Every day | ORAL | Status: DC
  Administered 2015-03-22: 81 mg via ORAL
  Filled 2015-03-21: qty 1

## 2015-03-21 MED ORDER — TICAGRELOR 90 MG PO TABS
90.0000 mg | ORAL_TABLET | Freq: Two times a day (BID) | ORAL | Status: DC
  Administered 2015-03-21 – 2015-03-23 (×3): 90 mg via ORAL
  Filled 2015-03-21 (×3): qty 1

## 2015-03-21 MED ORDER — ATORVASTATIN CALCIUM 80 MG PO TABS
80.0000 mg | ORAL_TABLET | Freq: Every day | ORAL | Status: DC
  Administered 2015-03-22: 80 mg via ORAL
  Filled 2015-03-21 (×2): qty 1

## 2015-03-21 MED ORDER — FENTANYL CITRATE (PF) 100 MCG/2ML IJ SOLN
25.0000 ug | INTRAMUSCULAR | Status: AC | PRN
Start: 2015-03-21 — End: 2015-03-22
  Administered 2015-03-21 – 2015-03-22 (×4): 25 ug via INTRAVENOUS
  Filled 2015-03-21 (×4): qty 2

## 2015-03-21 NOTE — ED Notes (Addendum)
Pt here for chest pain, SOB that started today. sts chronic chest pain but this is worse. sts he has been out of his medication. sts vomiting also.

## 2015-03-21 NOTE — Consult Note (Signed)
Patient ID: ARVIE BARTHOLOMEW MRN: [id number], DOB/AGE: 04/19/1966   Admit date: 03/21/2015  Primary Physician: [name], MD Primary Cardiologist: Dr. Peter Martinique  Problem List  Past Medical History  Diagnosis Date  . Barrett's esophagus     EGD - 11/27/09 - short segment of Barrett's  . Hyperlipidemia   . Anxiety   . Low back pain   . GERD (gastroesophageal reflux disease)   . Hiatal hernia     EGD - 11/27/2009  . Depression   . Adrenal tumor     a. s/p adrenal gland resection at [facility].  Marland Kitchen Hypertension   . Sleep apnea   . Degenerative joint disease     Bilateral knees. Significant knee pain since playing football in high school.   Joaquim Lai, kidney   . CAD (coronary artery disease)     a. 04/27/14 [place] s/p DES to mLAD and DES to mLCx  . Chronic chest pain     Past Surgical History  Procedure Laterality Date  . Knee arthroscopy Right X 6  . Lithotripsy  X 2  . Kidney stone surgery  X 1  . Coronary angioplasty with stent placement  04/27/2014    3.0 x 16 mm Promus DES to the mid LAD and 3.5 x 28 mm Promus to the mid LCx, otherwise 20-30 percent lesions, EF 55%  . Knee arthroplasty Right 1984  . Adrenalectomy  10/2013  . Cardiac catheterization  05/01/2014    Patent stents, other disease unchanged  . Left heart catheterization with coronary angiogram N/A 04/27/2014    Procedure: LEFT HEART CATHETERIZATION WITH CORONARY ANGIOGRAM;  Surgeon: Peter M Martinique, MD;  Location: [facility] CATH LAB;  Service: Cardiovascular;  Laterality: N/A;  . Cardiac catheterization  04/27/2014    Procedure: CORONARY STENT INTERVENTION;  Surgeon: Peter M Martinique, MD;  Location: [facility] CATH LAB;  Service: Cardiovascular;;  DES mid Cx  DES mid LAD  . Left heart catheterization with coronary angiogram N/A 05/01/2014    Procedure: LEFT HEART CATHETERIZATION WITH CORONARY ANGIOGRAM;  Surgeon: Burnell Blanks, MD;  Location: [facility] CATH LAB;  Service: Cardiovascular;  Laterality: N/A;    Allergies  Allergies    Allergen Reactions  . Cortisone Acetate Swelling  . Darvocet [Propoxyphene N-Acetaminophen] Nausea And Vomiting  . Nsaids Other (See Comments)    Caused stomach ulcers in the past   . Xanax [Alprazolam] Other (See Comments)    Pt states causes him to be mean    HPI  49 y/o male followed by Dr. Martinique with a 3 year history of chest pain associated with left arm pain and numbness. He had an abnormal stress test and underwent cardiac cath in August 2015. He had 2 vessel obstructive disease and underwent stenting of the mid LAD and mid LCx. He continued to have chest and arm pain and had repeat cardiac cath later the same week and stents were widely patent. He was readmitted in November with similar pain and had a normal Stress Myoview.  He also underwent resection of a pheochromocytoma in February 2015. Prior to this he had severe HTN.   He presents back to the ED with complaints of left sided chest pain. Described as chest pressure/ tightness and radiates to the left arm. Symptoms are worse with exertion. Also with associated dizziness and nausea/vomitting. He reports that he lost his insurance in March of this year and has only been taking ASA and Brilinta. He has been fully compliant with DAPT.  He has not tried NTG due to fear of hypotension. He was given fentanyl in ED with only slight improvement.   In ED, he continues with 6/10 CP. EKG shows sinus tach with rate of 101 bpm. No ischemic abnormalities. CXR unremarkable. POC troponin negative.   Home Medications  Prior to Admission medications   Medication Sig Start Date End Date Taking? Authorizing Provider  aspirin EC 81 MG tablet Take 81 mg by mouth daily.   Yes Historical Provider, MD  HYDROcodone-acetaminophen (NORCO/VICODIN) 5-325 MG per tablet Take 1-2 tablets by mouth every 8 (eight) hours as needed for moderate pain. Do not fill until 03/02/2015 02/26/15  Yes Leone Brand, MD  ticagrelor (BRILINTA) 90 MG TABS tablet Take 90 mg by  mouth 2 (two) times daily.   Yes Historical Provider, MD  atorvastatin (LIPITOR) 80 MG tablet Take 1 tablet (80 mg total) by mouth daily at 6 PM. Patient not taking: Reported on 03/21/2015 04/28/14   Eileen Stanford, PA-C  cephALEXin (KEFLEX) 500 MG capsule Take 1 capsule (500 mg total) by mouth 2 (two) times daily. Patient not taking: Reported on 03/21/2015 01/13/15   Delice Bison Ward, DO  metoprolol tartrate (LOPRESSOR) 25 MG tablet Take 1 tablet (25 mg total) by mouth 2 (two) times daily. Patient not taking: Reported on 03/21/2015 04/28/14   Eileen Stanford, PA-C  omeprazole (PRILOSEC) 40 MG capsule Take 1 capsule (40 mg total) by mouth 2 (two) times daily. Patient not taking: Reported on 03/21/2015 10/31/14   Leone Brand, MD  oxyCODONE-acetaminophen (PERCOCET/ROXICET) 5-325 MG per tablet Take 1 tablet by mouth every 4 (four) hours as needed. Patient not taking: Reported on 03/21/2015 01/13/15   Kristen N Ward, DO  promethazine (PHENERGAN) 25 MG tablet Take 1 tablet (25 mg total) by mouth every 6 (six) hours as needed for nausea or vomiting. Patient not taking: Reported on 03/21/2015 01/13/15   Delice Bison Ward, DO    Family History  Family History  Problem Relation Age of Onset  . Diabetes    . Stomach cancer      Social History  History   Social History  . Marital Status: Divorced    Spouse Name: N/A  . Number of Children: N/A  . Years of Education: N/A   Occupational History  . Unemployed    Social History Main Topics  . Smoking status: Never Smoker   . Smokeless tobacco: Never Used  . Alcohol Use: 1.2 oz/week    2 Shots of liquor per week  . Drug Use: No  . Sexual Activity: Yes   Other Topics Concern  . Not on file   Social History Narrative   Unemployed.    Single dad. One of his sons has ADHD.   2 grandparents died of CAD in their 33s, otherwise no family history of premature CAD.     Review of Systems General:  No chills, fever, night sweats or weight changes.    Cardiovascular:  No chest pain, dyspnea on exertion, edema, orthopnea, palpitations, paroxysmal nocturnal dyspnea. Dermatological: No rash, lesions/masses Respiratory: No cough, dyspnea Urologic: No hematuria, dysuria Abdominal:   No nausea, vomiting, diarrhea, bright red blood per rectum, melena, or hematemesis Neurologic:  No visual changes, wkns, changes in mental status. All other systems reviewed and are otherwise negative except as noted above.  Physical Exam  Blood pressure 116/82, pulse 97, temperature 97.9 F (36.6 C), temperature source Oral, resp. rate 15, SpO2 97 %.  General: Pleasant, NAD  Psych: Normal affect. Neuro: Alert and oriented X 3. Moves all extremities spontaneously. HEENT: Normal  Neck: Supple without bruits or JVD. Lungs:  Resp regular and unlabored, CTA. Heart: RRR no s3, s4, or murmurs. Abdomen: Soft, non-tender, non-distended, BS + x 4.  Extremities: No clubbing, cyanosis or edema. DP/PT/Radials 2+ and equal bilaterally.  Labs  Troponin [facility] of Care Test)  Recent Labs  03/21/15 1839  TROPIPOC 0.00   No results for input(s): CKTOTAL, CKMB, TROPONINI in the last 72 hours. Lab Results  Component Value Date   WBC 10.5 03/21/2015   HGB 13.1 03/21/2015   HCT 38.4* 03/21/2015   MCV 89.3 03/21/2015   PLT 222 03/21/2015    Recent Labs Lab 03/21/15 1831  NA 140  K 3.9  CL 107  CO2 24  BUN 18  CREATININE 1.11  CALCIUM 9.3  GLUCOSE 104*   Lab Results  Component Value Date   CHOL 126 09/19/2014   HDL 56 09/19/2014   LDLCALC 54 09/19/2014   TRIG 78 09/19/2014   Lab Results  Component Value Date   DDIMER <0.27 07/24/2014   Radiology/Studies  Dg Chest 2 View  03/21/2015   CLINICAL DATA:  Chest pain and shortness of breath for 1 day  EXAM: CHEST - 2 VIEW  COMPARISON:  07/24/2014  FINDINGS: The heart size and mediastinal contours are within normal limits. Both lungs are clear. The visualized skeletal structures are unremarkable.   IMPRESSION: No active disease.   Electronically Signed   By: Inez Catalina M.D.   On: 03/21/2015 19:11   ECG  Sinus tach. No ischemic abnormalites  ASSESSMENT AND PLAN  1. Chest Pain: patient has h/o CAD as well as h/o atypical pain. However recent symptoms of left sided chest pressure radiating to LUE and worse with exertion are concerning for cardiac etiology. EKG is nonischemic. POC troponin is negative. Will admit to observation and will cycle enzymes x 3. NPO at midnight. Will place patient on Dr. Doug Sou rounds for tomorrow. He will decide on whether or not to pursue further ischemic eval.     Signed, SIMMONS, BRITTAINY, PA-C 03/21/2015, 7:49 PM    Attending note:  Patient seen and examined. Reviewed extensive records and discussed the case with Ms. [employer]. Mr. Connye Burkitt has a history of two-vessel obstructive CAD status post DES to the mid LAD and circumflex in August 2015 following an abnormal stress test. Complicating matters however is also a chronic recurring history of chest pain without obvious etiology based on workup over time. He had a follow-up cardiac catheterization soon after his stent placement, and most recently a Myoview in November 2015 which was negative for ischemia. He states that he ran out of his insurance several months ago, has been off of his regular medications at least since March, with the exception of aspirin and Brilinta which he states he has been taking faithfully. He has been having nearly daily chest discomfort, usually only lasts for a few minutes, no obvious precipitant, no alleviating factors. Today he was walking with his wife in [place] when he began to experience more intense chest discomfort associated with nausea and emesis as well as shortness of breath prompting him to come to the ER. On examination now he is comfortable, reports mild chest discomfort but is in no distress. He is hemodynamically stable. Lungs are clear, cardiac exam without rub  or gallop, no significant murmur. Initial troponin I is normal, chest x-ray shows no acute findings, and ECG shows  no acute ST segment changes. Patient states symptoms today were worse than usual, although difficult to sort out given his chronic recurring symptoms. Plan is to admit him overnight for observation, cycle a complete set of cardiac markers, and follow-up ECG in the morning. We will keep him nothing by mouth for potential testing, although will plan to have Dr. Martinique see him on rounds first.  Satira Sark, M.D., F.A.C.C.

## 2015-03-21 NOTE — Care Management Note (Signed)
Case Management Note  Patient Details  Name: Andrew Huerta MRN: [id number] Date of Birth: 08-19-1966  Subjective/Objective:  Patient presented to [facility] ED with complaints of CP, Patient states, he has been out of medications for several months after losing [place] [employer] health insurance. Patient has not followed up with PCP after losing insurance. CAD with stents follows by Dr. Peter Martinique.             Action/Plan: Medication assistance PCP referral  Expected Discharge Date:                  Expected Discharge Plan:     In-House Referral:     Discharge planning Services     PCP referral at the [facility] Medication Assistance  Post Acute Care Choice:    Choice offered to:     DME Arranged:    DME Agency:     HH Arranged:    [place] Agency:     Status of Service:   IN process will continue to follow   Medicare Important Message Given:    Date Medicare IM Given:    Medicare IM give by:    Date Additional Medicare IM Given:    Additional Medicare Important Message give by:     If discussed at [place] of Stay Meetings, dates discussed:    Additional Comments:  Patient states he has run out of medications since 3/16 when he loss his Medicaid.and says he has been getting Brilinta samples from Dr. Doug Sou office. As per patient, he  was being followed at the [facility] up until March 2016.  Discussed the [facility] and services rendered., patient is agreeable with establsihing care with clinic. Follow up appt scheduled with [facility] Tuesday 7/26 at 9:45a. Patient made aware of follow up appt information placed on the AVS. Laurena Slimmer, RN 03/21/2015, 10:17 PM

## 2015-03-21 NOTE — ED Provider Notes (Signed)
CSN: [id number]     Arrival date & time 03/21/15  1815 History   First MD Initiated Contact with Patient 03/21/15 1827     Chief Complaint  Patient presents with  . Chest Pain     (Consider location/radiation/quality/duration/timing/severity/associated sxs/prior Treatment) Patient is a 49 y.o. male presenting with chest pain. The history is provided by the patient. No language interpreter was used.  Chest Pain Pain location:  Substernal area Pain quality: pressure   Pain radiates to:  L jaw and L arm Pain radiates to the back: no   Pain severity:  Moderate Onset quality:  Sudden Timing:  Constant Progression:  Worsening Chronicity:  Recurrent Context: at rest   Relieved by:  Nothing Worsened by:  Nothing tried Ineffective treatments:  Aspirin Associated symptoms: nausea and shortness of breath   Associated symptoms: no abdominal pain, no altered mental status, no anxiety, no back pain, no claudication, no cough, no diaphoresis, no dizziness, no fatigue, no fever, no headache and not vomiting   Risk factors: coronary artery disease, male sex and smoking     Past Medical History  Diagnosis Date  . Barrett's esophagus     egd - 11/27/09 - short segment of Barrett's  . Hyperlipidemia   . Anxiety   . Low back pain   . GERD (gastroesophageal reflux disease)   . Hiatal hernia     egd - 11/27/2009  . Depression   . Adrenal tumor     a. s/p adrenal gland resection at [facility].  Marland Kitchen Hypertension   . Sleep apnea   . Degenerative joint disease     Bilateral knees. Significant knee pain since playing football in high school.   Joaquim Lai, kidney   . CAD (coronary artery disease)     a. 04/27/14 [place] s/p DES to mLAD and DES to mLCx  . Anginal pain   . Chronic chest pain    Past Surgical History  Procedure Laterality Date  . Knee arthroscopy Right X 6  . Lithotripsy  X 2  . Kidney stone surgery  X 1  . Coronary angioplasty with stent placement  04/27/2014    3.0 x 16 mm Promus DES to  the mid LAD and 3.5 x 28 mm Promus to the mid LCx, otherwise 20-30 percent lesions, EF 55%  . Knee arthroplasty Right 1984  . Adrenalectomy  10/2013  . Cardiac catheterization  05/01/2014    Patent stents, other disease unchanged  . Left heart catheterization with coronary angiogram N/A 04/27/2014    Procedure: LEFT HEART CATHETERIZATION WITH CORONARY ANGIOGRAM;  Surgeon: Peter M Martinique, MD;  Location: [facility] CATH LAB;  Service: Cardiovascular;  Laterality: N/A;  . Cardiac catheterization  04/27/2014    Procedure: CORONARY STENT INTERVENTION;  Surgeon: Peter M Martinique, MD;  Location: [facility] CATH LAB;  Service: Cardiovascular;;  DES mid Cx  DES mid LAD  . Left heart catheterization with coronary angiogram N/A 05/01/2014    Procedure: LEFT HEART CATHETERIZATION WITH CORONARY ANGIOGRAM;  Surgeon: Burnell Blanks, MD;  Location: [facility] CATH LAB;  Service: Cardiovascular;  Laterality: N/A;   Family History  Problem Relation Age of Onset  . Diabetes    . Stomach cancer     History  Substance Use Topics  . Smoking status: Never Smoker   . Smokeless tobacco: Never Used  . Alcohol Use: 1.2 oz/week    2 Shots of liquor per week    Review of Systems  Constitutional: Negative for fever, diaphoresis and  fatigue.  Respiratory: Positive for shortness of breath. Negative for cough and chest tightness.   Cardiovascular: Positive for chest pain. Negative for claudication.  Gastrointestinal: Positive for nausea. Negative for vomiting and abdominal pain.  Musculoskeletal: Negative for back pain.  Neurological: Negative for dizziness, light-headedness and headaches.  Psychiatric/Behavioral: Negative for confusion.  All other systems reviewed and are negative.     Allergies  Cortisone acetate; Darvocet; Nsaids; and Xanax  Home Medications   Prior to Admission medications   Medication Sig Start Date End Date Taking? Authorizing Provider  aspirin EC 81 MG tablet Take 81 mg by mouth daily.    Historical  Provider, MD  atorvastatin (LIPITOR) 80 MG tablet Take 1 tablet (80 mg total) by mouth daily at 6 PM. 04/28/14   Eileen Stanford, PA-C  cephALEXin (KEFLEX) 500 MG capsule Take 1 capsule (500 mg total) by mouth 2 (two) times daily. 01/13/15   Kristen N Ward, DO  gabapentin (NEURONTIN) 300 MG capsule Take 300 mg by mouth at bedtime as needed (restless leg syndrome).    Historical Provider, MD  HYDROcodone-acetaminophen (NORCO/VICODIN) 5-325 MG per tablet Take 1-2 tablets by mouth every 8 (eight) hours as needed for moderate pain. Do not fill until 03/02/2015 02/26/15   Leone Brand, MD  metoprolol tartrate (LOPRESSOR) 25 MG tablet Take 1 tablet (25 mg total) by mouth 2 (two) times daily. 04/28/14   Eileen Stanford, PA-C  omeprazole (PRILOSEC) 40 MG capsule Take 1 capsule (40 mg total) by mouth 2 (two) times daily. 10/31/14   Leone Brand, MD  oxyCODONE-acetaminophen (PERCOCET/ROXICET) 5-325 MG per tablet Take 1 tablet by mouth every 4 (four) hours as needed. 01/13/15   Kristen N Ward, DO  promethazine (PHENERGAN) 25 MG tablet Take 1 tablet (25 mg total) by mouth every 6 (six) hours as needed for nausea or vomiting. 01/13/15   Delice Bison Ward, DO  ticagrelor (BRILINTA) 90 MG TABS tablet Take 90 mg by mouth 2 (two) times daily.    Historical Provider, MD   BP 120/85 mmHg  Pulse 112  Temp(Src) 97.9 F (36.6 C) (Oral)  Resp 16  SpO2 94%    Physical Exam  Constitutional: He is oriented to person, place, and time. He appears well-developed and well-nourished. No distress.  Well appearing, in NAD  HENT:  Head: Normocephalic and atraumatic.  Nose: Nose normal.  Mouth/Throat: Oropharynx is clear and moist. No oropharyngeal exudate.  Eyes: EOM are normal. Pupils are equal, round, and reactive to light.  Neck: Normal range of motion. Neck supple.  Cardiovascular: Normal rate, regular rhythm, normal heart sounds and intact distal pulses.   No murmur heard. Pulmonary/Chest: Effort normal and breath  sounds normal. No respiratory distress. He has no wheezes. He exhibits no tenderness.  Abdominal: Soft. He exhibits no distension. There is no tenderness. There is no guarding.  Musculoskeletal: Normal range of motion. He exhibits no tenderness.  Neurological: He is alert and oriented to person, place, and time. No cranial nerve deficit. Coordination normal.  Skin: Skin is warm and dry. He is not diaphoretic. No pallor.  Psychiatric: He has a normal mood and affect. His behavior is normal. Judgment and thought content normal.  Nursing note and vitals reviewed.   ED Course  Procedures (including critical care time) Labs Review Labs Reviewed  BASIC METABOLIC PANEL - Abnormal; Notable for the following:    Glucose, Bld 104 (*)    All other components within normal limits  CBC - Abnormal;  Notable for the following:    HCT 38.4 (*)    All other components within normal limits  I-STAT TROPOININ, ED    Imaging Review Dg Chest 2 View  03/21/2015   CLINICAL DATA:  Chest pain and shortness of breath for 1 day  EXAM: CHEST - 2 VIEW  COMPARISON:  07/24/2014  FINDINGS: The heart size and mediastinal contours are within normal limits. Both lungs are clear. The visualized skeletal structures are unremarkable.  IMPRESSION: No active disease.   Electronically Signed   By: Inez Catalina M.D.   On: 03/21/2015 19:11     EKG Interpretation   Date/Time:  Wednesday March 21 2015 18:19:18 EDT Ventricular Rate:  101 PR Interval:  150 QRS Duration: 84 QT Interval:  328 QTC Calculation: 425 R Axis:   62 Text Interpretation:  Sinus tachycardia with Fusion complexes Otherwise  normal ECG No significant change since last tracing Confirmed by Mingo Amber   MD, BLAIR ([id number]) on 03/21/2015 7:13:48 PM      MDM   Final diagnoses:  Chest pain, unspecified chest pain type  History of coronary artery stent placement   Pt is a 49 yo M with hx of CAD with stent to LAD and left circumflex in August 2015, HTN  associated with adrenal mass then resolved after resection, and HLD who presents with chest pain.  Complains of substernal chest pressure today that was associated with SOB, lightheadedness, and nausea.  Pain radiated to his left jaw and shoulder.  More severe than his typical chest pain.  Took ASA 325 prior to arrival.   Moderate pain still continued on arrival to the ED but pt looks well.  Soft BPs at 810 systolic, so will hold NTG at this time.  Pain control with fentanyl x 2 doses.   Due to hx of CAD and story concerning for ACS, will send labs, EKG, and CXR.   EKG with sinus tachy at 101, no signs of acute ischemia.  CXR with no acute cardiopulmonary disease.  First trop negative.   Cardiologist is Dr. Martinique  Spoke to [facility] team at [place].  To admit patient for ACS rule out.  Patient stable for tele floor.   Labs, EKGs, and imaging were reviewed and interpreted by myself and my attending, and incorporated in the medical decision making.  Patient was seen with ED Attending, Dr. Omer Jack, MD   Tori Milks, MD 03/23/15 1751  Evelina Bucy, MD 03/31/15 [phone]

## 2015-03-22 ENCOUNTER — Encounter (HOSPITAL_COMMUNITY)
Admission: EM | Disposition: A | Discharge status: Home / Self Care | Payer: Self-pay | Source: Home / Self Care | Attending: Emergency Medicine

## 2015-03-22 DIAGNOSIS — R0789 Other chest pain: Secondary | ICD-10-CM | POA: Diagnosis present

## 2015-03-22 DIAGNOSIS — R079 Chest pain, unspecified: Secondary | ICD-10-CM | POA: Diagnosis present

## 2015-03-22 DIAGNOSIS — I2510 Atherosclerotic heart disease of native coronary artery without angina pectoris: Secondary | ICD-10-CM

## 2015-03-22 DIAGNOSIS — R072 Precordial pain: Principal | ICD-10-CM

## 2015-03-22 HISTORY — PX: CARDIAC CATHETERIZATION: SHX172

## 2015-03-22 LAB — BASIC METABOLIC PANEL
Anion gap: 5 (ref 5–15)
BUN: 16 mg/dL (ref 6–20)
CALCIUM: 9.1 mg/dL (ref 8.9–10.3)
CHLORIDE: 108 mmol/L (ref 101–111)
CO2: 27 mmol/L (ref 22–32)
Creatinine, Ser: 0.97 mg/dL (ref 0.61–1.24)
GFR calc Af Amer: >60 mL/min (ref >60)
GLUCOSE: 97 mg/dL (ref 65–99)
Potassium: 3.9 mmol/L (ref 3.5–5.1)
SODIUM: 140 mmol/L (ref 135–145)

## 2015-03-22 LAB — TROPONIN I
Troponin I: <0.03 ng/mL (ref <0.031)
Troponin I: <0.03 ng/mL (ref <0.031)

## 2015-03-22 LAB — PROTIME-INR
INR: 1.1 (ref 0.00–1.49)
PROTHROMBIN TIME: 14.4 s (ref 11.6–15.2)

## 2015-03-22 SURGERY — LEFT HEART CATH AND CORONARY ANGIOGRAPHY

## 2015-03-22 MED ORDER — SODIUM CHLORIDE 0.9 % IJ SOLN: 3.0000 mL | Freq: Two times a day (BID) | INTRAMUSCULAR | Status: DC

## 2015-03-22 MED ORDER — ADENOSINE 12 MG/4ML IV SOLN
12.0000 mL | Freq: Once | INTRAVENOUS | Status: DC
  Filled 2015-03-22: qty 12

## 2015-03-22 MED ORDER — ONDANSETRON HCL 4 MG/2ML IJ SOLN
4.0000 mg | Freq: Four times a day (QID) | INTRAMUSCULAR | Status: DC | PRN
  Administered 2015-03-22: 4 mg via INTRAVENOUS
  Filled 2015-03-22: qty 2

## 2015-03-22 MED ORDER — HEPARIN SODIUM (PORCINE) 1000 UNIT/ML IJ SOLN
INTRAMUSCULAR | Status: DC | PRN
  Administered 2015-03-22: 6000 [IU] via INTRAVENOUS

## 2015-03-22 MED ORDER — HYDROCODONE-ACETAMINOPHEN 5-325 MG PO TABS
2.0000 | ORAL_TABLET | Freq: Once | ORAL | Status: AC
  Administered 2015-03-22: 2 via ORAL
  Filled 2015-03-22: qty 2

## 2015-03-22 MED ORDER — RANITIDINE HCL 150 MG PO TABS
150.0000 mg | ORAL_TABLET | Freq: Every day | ORAL | Status: DC
Start: 2015-03-22 — End: 2015-03-27

## 2015-03-22 MED ORDER — MIDAZOLAM HCL 2 MG/2ML IJ SOLN
INTRAMUSCULAR | Status: DC | PRN
  Administered 2015-03-22 (×2): 2 mg via INTRAVENOUS

## 2015-03-22 MED ORDER — SODIUM CHLORIDE 0.9 % WEIGHT BASED INFUSION
1.0000 mL/kg/h | INTRAVENOUS | Status: DC
  Administered 2015-03-22: 1 mL/kg/h via INTRAVENOUS

## 2015-03-22 MED ORDER — NITROGLYCERIN 1 MG/10 ML FOR IR/CATH LAB
INTRA_ARTERIAL | Status: AC
  Filled 2015-03-22: qty 10

## 2015-03-22 MED ORDER — NITROGLYCERIN 0.4 MG SL SUBL: 0.4000 mg | SUBLINGUAL_TABLET | SUBLINGUAL | Status: DC | PRN

## 2015-03-22 MED ORDER — IOHEXOL 350 MG/ML SOLN
INTRAVENOUS | Status: DC | PRN
  Administered 2015-03-22: 70 mL via INTRACARDIAC

## 2015-03-22 MED ORDER — METOPROLOL TARTRATE 25 MG PO TABS: 25.0000 mg | ORAL_TABLET | Freq: Two times a day (BID) | ORAL | Status: DC

## 2015-03-22 MED ORDER — MIDAZOLAM HCL 2 MG/2ML IJ SOLN
INTRAMUSCULAR | Status: AC
  Filled 2015-03-22: qty 2

## 2015-03-22 MED ORDER — SODIUM CHLORIDE 0.9 % IV SOLN: 250.0000 mL | INTRAVENOUS | Status: DC | PRN

## 2015-03-22 MED ORDER — SODIUM CHLORIDE 0.9 % IJ SOLN: 3.0000 mL | INTRAMUSCULAR | Status: DC | PRN

## 2015-03-22 MED ORDER — SODIUM CHLORIDE 0.9 % IJ SOLN
INTRAMUSCULAR | Status: DC | PRN
  Administered 2015-03-22: 17:00:00 via INTRA_ARTERIAL

## 2015-03-22 MED ORDER — SODIUM CHLORIDE 0.9 % IV SOLN
INTRAVENOUS | Status: AC
  Administered 2015-03-22 (×2): via INTRAVENOUS

## 2015-03-22 MED ORDER — FENTANYL CITRATE (PF) 100 MCG/2ML IJ SOLN
INTRAMUSCULAR | Status: AC
Start: 2015-03-22 — End: 2015-03-22
  Filled 2015-03-22: qty 2

## 2015-03-22 MED ORDER — FENTANYL CITRATE (PF) 100 MCG/2ML IJ SOLN
INTRAMUSCULAR | Status: DC | PRN
  Administered 2015-03-22 (×2): 25 ug via INTRAVENOUS

## 2015-03-22 MED ORDER — SODIUM CHLORIDE 0.9 % WEIGHT BASED INFUSION
3.0000 mL/kg/h | INTRAVENOUS | Status: AC
  Administered 2015-03-22: 3 mL/kg/h via INTRAVENOUS

## 2015-03-22 MED ORDER — HEPARIN SODIUM (PORCINE) 1000 UNIT/ML IJ SOLN
INTRAMUSCULAR | Status: AC
  Filled 2015-03-22: qty 1

## 2015-03-22 MED ORDER — NITROGLYCERIN 1 MG/10 ML FOR IR/CATH LAB
INTRA_ARTERIAL | Status: DC | PRN
  Administered 2015-03-22: 17:00:00

## 2015-03-22 MED ORDER — ATORVASTATIN CALCIUM 80 MG PO TABS: 80.0000 mg | ORAL_TABLET | Freq: Every day | ORAL | Status: DC

## 2015-03-22 MED ORDER — VERAPAMIL HCL 2.5 MG/ML IV SOLN
INTRAVENOUS | Status: AC
  Filled 2015-03-22: qty 2

## 2015-03-22 SURGICAL SUPPLY — 14 items
CATH INFINITI 5 FR JL3.5 (CATHETERS) ×3 IMPLANT
CATH INFINITI 5FR ANG PIGTAIL (CATHETERS) ×3 IMPLANT
CATH INFINITI 5FR MULTPACK ANG (CATHETERS) IMPLANT
CATH INFINITI JR4 5F (CATHETERS) ×3 IMPLANT
DEVICE RAD COMP TR BAND LRG (VASCULAR PRODUCTS) ×3 IMPLANT
GLIDESHEATH SLEND SS 6F .021 (SHEATH) ×3 IMPLANT
KIT HEART LEFT (KITS) ×3 IMPLANT
PACK CARDIAC CATHETERIZATION (CUSTOM PROCEDURE TRAY) ×3 IMPLANT
SHEATH PINNACLE 5F 10CM (SHEATH) IMPLANT
SYR MEDRAD MARK V 150ML (SYRINGE) ×3 IMPLANT
TRANSDUCER W/STOPCOCK (MISCELLANEOUS) ×3 IMPLANT
TUBING CIL FLEX 10 FLL-RA (TUBING) ×3 IMPLANT
WIRE EMERALD 3MM-J .035X150CM (WIRE) IMPLANT
WIRE SAFE-T 1.5MM-J .035X260CM (WIRE) ×3 IMPLANT

## 2015-03-22 NOTE — Progress Notes (Signed)
RN held pressure at hematoma site for 15 minutes.  Area now soft to palpation.  Dr. Philbert Riser at bedside stated it was ok to wrap area above TR band tightly with coban and at about 2150 attempt to remove 68mL of air from band.

## 2015-03-22 NOTE — Progress Notes (Signed)
TELEMETRY: Reviewed telemetry pt in NSR: Filed Vitals:   03/21/15 2130 03/21/15 2200 03/21/15 2234 03/22/15 0500  BP: 105/77 110/75 125/89 120/86  Pulse: 82 78 77 62  Temp:   97.6 F (36.4 C) 97.6 F (36.4 C)  TempSrc:   Oral Oral  Resp: 16 14 18 20   Height:   5\' 7"  (1.702 m)   Weight:   74.844 kg (165 lb)   SpO2: 97% 97% 96% 98%    Intake/Output Summary (Last 24 hours) at 03/22/15 0915 Last data filed at 03/22/15 0300  Gross per 24 hour  Intake    240 ml  Output    200 ml  Net     40 ml   Filed Weights   03/21/15 2234  Weight: 74.844 kg (165 lb)    Subjective No chest pain at present.  Marland Kitchen aspirin EC  81 mg Oral Daily  . atorvastatin  80 mg Oral q1800  . metoprolol tartrate  25 mg Oral BID  . ticagrelor  90 mg Oral BID      LABS: Basic Metabolic Panel:  Recent Labs  03/21/15 1831 03/22/15 0420  NA 140 140  K 3.9 3.9  CL 107 108  CO2 24 27  GLUCOSE 104* 97  BUN 18 16  CREATININE 1.11 0.97  CALCIUM 9.3 9.1   Liver Function Tests: No results for input(s): AST, ALT, ALKPHOS, BILITOT, PROT, ALBUMIN in the last 72 hours. No results for input(s): LIPASE, AMYLASE in the last 72 hours. CBC:  Recent Labs  03/21/15 1831  WBC 10.5  HGB 13.1  HCT 38.4*  MCV 89.3  PLT 222   Cardiac Enzymes:  Recent Labs  03/21/15 2325 03/22/15 0420  TROPONINI <0.03 <0.03   BNP: No results for input(s): PROBNP in the last 72 hours. D-Dimer: No results for input(s): DDIMER in the last 72 hours. Hemoglobin A1C: No results for input(s): HGBA1C in the last 72 hours. Fasting Lipid Panel: No results for input(s): CHOL, HDL, LDLCALC, TRIG, CHOLHDL, LDLDIRECT in the last 72 hours. Thyroid Function Tests: No results for input(s): TSH, T4TOTAL, T3FREE, THYROIDAB in the last 72 hours.  Invalid input(s): FREET3   Radiology/Studies:  Dg Chest 2 View  03/21/2015   CLINICAL DATA:  Chest pain and shortness of breath for 1 day  EXAM: CHEST - 2 VIEW  COMPARISON:   07/24/2014  FINDINGS: The heart size and mediastinal contours are within normal limits. Both lungs are clear. The visualized skeletal structures are unremarkable.  IMPRESSION: No active disease.   Electronically Signed   By: Inez Catalina M.D.   On: 03/21/2015 19:11    PHYSICAL EXAM General: Well developed, well nourished, in no acute distress. Head: Normocephalic, atraumatic, sclera non-icteric, oropharynx is clear Neck: Negative for carotid bruits. JVD not elevated. No adenopathy Lungs: Clear bilaterally to auscultation without wheezes, rales, or rhonchi. Breathing is unlabored. Heart: RRR S1 S2 without murmurs, rubs, or gallops.  Abdomen: Soft, non-tender, non-distended with normoactive bowel sounds. No hepatomegaly. No rebound/guarding. No obvious abdominal masses. Msk:  Strength and tone appears normal for age. Extremities: No clubbing, cyanosis or edema.  Distal pedal pulses are 2+ and equal bilaterally. Neuro: Alert and oriented X 3. Moves all extremities spontaneously. Psych:  Responds to questions appropriately with a normal affect.  ASSESSMENT AND PLAN: 1. Chest pain. Patient reports increased frequency of chest pain at rest. Pain midsternal radiating to left arm and associated with nausea and vomiting. No clear triggers. He is s/p stenting  of LAD and LCx in August 2015 with DES. He has been compliant with ASA and Brilinta but ran out of his other meds. He had repeat cath following stents due to chest pain in 8/15 and then had normal stress Myoview in Nov. 2015. He reports recent EGD with Dr. Watt Climes 2 months ago without acute change. Now cardiac enzymes and Ecg are normal. Due to progressive symptoms I have recommended repeat cardiac cath. If clear will DC home and consider further evaluation as outpatient. Will need to discuss with Dr. Watt Climes. He had incidental findings of gallstones on a CT in 2013. ? If this could be source. Will resume metoprolol and statin therapy. Could be DC if cath  OK.  Present on Admission:  . Chest pain  Signed, Mckinley Adelstein Martinique, [place] 03/22/2015 9:15 AM

## 2015-03-22 NOTE — Progress Notes (Signed)
Called by Janett Billow, RN to assess site of TR band. R radial band with 6cc air remaining with quarter size hematoma noted proximal  to band.  Right radial pulse palpable, negative Allens test, old bleeding at site. Patient c/o 6/10 soreness at site of hematoma.  Instructions given to Tiffany, RN to apply manual pressure for 15 minutes followed by a pressure dressing with gauze and coban tape for one hour.  May continue to withdraw air if no bleeding noted at site.  Will follow as needed. 2130 Dr Philbert Riser at bedside.  Discharge orders on hold at present time.  Pt aware and agreeable.

## 2015-03-22 NOTE — Discharge Summary (Signed)
Patient ID: Andrew Huerta,  MRN: [id number], DOB/AGE: 1966/02/07 49 y.o.  Admit date: 03/21/2015 Discharge date: 03/22/2015  Primary Care Provider: [name], MD Primary Cardiologist: Dr Martinique  Discharge Diagnoses Principal Problem:   Chest pain with moderate risk of acute coronary syndrome Active Problems:   CAD- 2 V PCI Aug 2015   Essential hypertension   Barrett's esophagus   DJD-knees- needs TKR   Hyperlipidemia    Procedures: Coronary angiogram 03/22/15  Hospital Course: 49 y/o male followed by Dr. Martinique with a 3 year history of chest pain associated with left arm pain and numbness. He had an abnormal stress test and underwent cardiac cath in August 2015. He had 2 vessel obstructive disease and underwent stenting of the mid LAD and mid LCx. He continued to have chest and arm pain and had repeat cardiac cath later the same week and stents were widely patent. He was readmitted in November2015 with similar pain and had a normal Stress Myoview. He also underwent resection of a pheochromocytoma in February 2015. Prior to this he had severe HTN.   He presented 03/21/15 with chest pain worrisome for [place]. He ruled out for an MI. He underwent diagnostic cath by Dr Burt Knack 03/22/15 which revealed non obstructive disease. He has been off all his medications except Brilinta, which was supplied to him, and ASA. Satin and beta blocker resumed at discharge. He has a follow up in the [facility] 03/27/15. He can f/u with Dr Martinique in a few months.   Discharge Vitals:  Blood pressure 132/83, pulse 66, temperature 97.9 F (36.6 C), temperature source Oral, resp. rate 15, height 5\' 7"  (1.702 m), weight 165 lb (74.844 kg), SpO2 100 %.    Labs: Results for orders placed or performed during the hospital encounter of 03/21/15 (from the past 24 hour(s))  Troponin I     Status: None   Collection Time: 03/21/15 11:25 PM  Result Value Ref Range   Troponin I <0.03 <0.031 ng/mL    Troponin I     Status: None   Collection Time: 03/22/15  4:20 AM  Result Value Ref Range   Troponin I <0.03 <0.031 ng/mL  Basic metabolic panel     Status: None   Collection Time: 03/22/15  4:20 AM  Result Value Ref Range   Sodium 140 135 - 145 mmol/L   Potassium 3.9 3.5 - 5.1 mmol/L   Chloride 108 101 - 111 mmol/L   CO2 27 22 - 32 mmol/L   Glucose, Bld 97 65 - 99 mg/dL   BUN 16 6 - 20 mg/dL   Creatinine, Ser 0.97 0.61 - 1.24 mg/dL   Calcium 9.1 8.9 - 10.3 mg/dL   GFR calc non Af Amer >60 >60 mL/min   GFR calc Af Amer >60 >60 mL/min   Anion gap 5 5 - 15  Troponin I     Status: None   Collection Time: 03/22/15 10:30 AM  Result Value Ref Range   Troponin I <0.03 <0.031 ng/mL  Protime-INR     Status: None   Collection Time: 03/22/15 10:30 AM  Result Value Ref Range   Prothrombin Time 14.4 11.6 - 15.2 seconds   INR 1.10 0.00 - 1.49    Disposition:      Follow-up Information    Follow up with [facility]     On 03/27/2015.   Why:  appointment for follow up scheduled 7/26 at 9:45 at  the [facility] information:   [address] [place] [address] [phone]      Follow up with Peter Martinique, MD In 3 months.   Specialty:  Cardiology   Why:  office will contact you   Contact information:   [place] [address] [phone]       Discharge Medications:    Medication List    STOP taking these medications        cephALEXin 500 MG capsule  Commonly known as:  KEFLEX     omeprazole 40 MG capsule  Commonly known as:  PRILOSEC     oxyCODONE-acetaminophen 5-325 MG per tablet  Commonly known as:  PERCOCET/ROXICET     promethazine 25 MG tablet  Commonly known as:  PHENERGAN      TAKE these medications        aspirin EC 81 MG tablet  Take 81 mg by mouth daily.     atorvastatin 80 MG tablet  Commonly known as:  LIPITOR  Take 1 tablet (80 mg  total) by mouth daily at 6 PM.     HYDROcodone-acetaminophen 5-325 MG per tablet  Commonly known as:  NORCO/VICODIN  Take 1-2 tablets by mouth every 8 (eight) hours as needed for moderate pain. Do not fill until 03/02/2015     metoprolol tartrate 25 MG tablet  Commonly known as:  LOPRESSOR  Take 1 tablet (25 mg total) by mouth 2 (two) times daily.     nitroGLYCERIN 0.4 MG SL tablet  Commonly known as:  NITROSTAT  Place 1 tablet (0.4 mg total) under the tongue every 5 (five) minutes as needed for chest pain.     ranitidine 150 MG tablet  Commonly known as:  ZANTAC  Take 1 tablet (150 mg total) by mouth daily.     ticagrelor 90 MG Tabs tablet  Commonly known as:  BRILINTA  Take 90 mg by mouth 2 (two) times daily.         Duration of Discharge Encounter: Greater than 30 minutes including physician time.  Angelena Form PA-C 03/22/2015 6:56 PM

## 2015-03-22 NOTE — Interval H&P Note (Signed)
History and Physical Interval Note:  03/22/2015 5:06 PM  Andrew Huerta  has presented today for surgery, with the diagnosis of cp  The various methods of treatment have been discussed with the patient and family. After consideration of risks, benefits and other options for treatment, the patient has consented to  Procedure(s): Left Heart Cath and Coronary Angiography (N/A) as a surgical intervention .  The patient's history has been reviewed, patient examined, no change in status, stable for surgery.  I have reviewed the patient's chart and labs.  Questions were answered to the patient's satisfaction.     Sherren Mocha

## 2015-03-22 NOTE — H&P (View-Only) (Signed)
TELEMETRY: Reviewed telemetry pt in NSR: Filed Vitals:   03/21/15 2130 03/21/15 2200 03/21/15 2234 03/22/15 0500  BP: 105/77 110/75 125/89 120/86  Pulse: 82 78 77 62  Temp:   97.6 F (36.4 C) 97.6 F (36.4 C)  TempSrc:   Oral Oral  Resp: 16 14 18 20   Height:   5\' 7"  (1.702 m)   Weight:   74.844 kg (165 lb)   SpO2: 97% 97% 96% 98%    Intake/Output Summary (Last 24 hours) at 03/22/15 0915 Last data filed at 03/22/15 0300  Gross per 24 hour  Intake    240 ml  Output    200 ml  Net     40 ml   Filed Weights   03/21/15 2234  Weight: 74.844 kg (165 lb)    Subjective No chest pain at present.  Marland Kitchen aspirin EC  81 mg Oral Daily  . atorvastatin  80 mg Oral q1800  . metoprolol tartrate  25 mg Oral BID  . ticagrelor  90 mg Oral BID      LABS: Basic Metabolic Panel:  Recent Labs  03/21/15 1831 03/22/15 0420  NA 140 140  K 3.9 3.9  CL 107 108  CO2 24 27  GLUCOSE 104* 97  BUN 18 16  CREATININE 1.11 0.97  CALCIUM 9.3 9.1   Liver Function Tests: No results for input(s): AST, ALT, ALKPHOS, BILITOT, PROT, ALBUMIN in the last 72 hours. No results for input(s): LIPASE, AMYLASE in the last 72 hours. CBC:  Recent Labs  03/21/15 1831  WBC 10.5  HGB 13.1  HCT 38.4*  MCV 89.3  PLT 222   Cardiac Enzymes:  Recent Labs  03/21/15 2325 03/22/15 0420  TROPONINI <0.03 <0.03   BNP: No results for input(s): PROBNP in the last 72 hours. D-Dimer: No results for input(s): DDIMER in the last 72 hours. Hemoglobin A1C: No results for input(s): HGBA1C in the last 72 hours. Fasting Lipid Panel: No results for input(s): CHOL, HDL, LDLCALC, TRIG, CHOLHDL, LDLDIRECT in the last 72 hours. Thyroid Function Tests: No results for input(s): TSH, T4TOTAL, T3FREE, THYROIDAB in the last 72 hours.  Invalid input(s): FREET3   Radiology/Studies:  Dg Chest 2 View  03/21/2015   CLINICAL DATA:  Chest pain and shortness of breath for 1 day  EXAM: CHEST - 2 VIEW  COMPARISON:   07/24/2014  FINDINGS: The heart size and mediastinal contours are within normal limits. Both lungs are clear. The visualized skeletal structures are unremarkable.  IMPRESSION: No active disease.   Electronically Signed   By: Inez Catalina M.D.   On: 03/21/2015 19:11    PHYSICAL EXAM General: Well developed, well nourished, in no acute distress. Head: Normocephalic, atraumatic, sclera non-icteric, oropharynx is clear Neck: Negative for carotid bruits. JVD not elevated. No adenopathy Lungs: Clear bilaterally to auscultation without wheezes, rales, or rhonchi. Breathing is unlabored. Heart: RRR S1 S2 without murmurs, rubs, or gallops.  Abdomen: Soft, non-tender, non-distended with normoactive bowel sounds. No hepatomegaly. No rebound/guarding. No obvious abdominal masses. Msk:  Strength and tone appears normal for age. Extremities: No clubbing, cyanosis or edema.  Distal pedal pulses are 2+ and equal bilaterally. Neuro: Alert and oriented X 3. Moves all extremities spontaneously. Psych:  Responds to questions appropriately with a normal affect.  ASSESSMENT AND PLAN: 1. Chest pain. Patient reports increased frequency of chest pain at rest. Pain midsternal radiating to left arm and associated with nausea and vomiting. No clear triggers. He is s/p stenting  of LAD and LCx in August 2015 with DES. He has been compliant with ASA and Brilinta but ran out of his other meds. He had repeat cath following stents due to chest pain in 8/15 and then had normal stress Myoview in Nov. 2015. He reports recent EGD with Dr. Watt Climes 2 months ago without acute change. Now cardiac enzymes and Ecg are normal. Due to progressive symptoms I have recommended repeat cardiac cath. If clear will DC home and consider further evaluation as outpatient. Will need to discuss with Dr. Watt Climes. He had incidental findings of gallstones on a CT in 2013. ? If this could be source. Will resume metoprolol and statin therapy. Could be DC if cath  OK.  Present on Admission:  . Chest pain  Signed, Andrew Huerta, [place] 03/22/2015 9:15 AM

## 2015-03-22 NOTE — Discharge Instructions (Signed)
Radial Site Care °Refer to this sheet in the next few weeks. These instructions provide you with information on caring for yourself after your procedure. Your caregiver may also give you more specific instructions. Your treatment has been planned according to current medical practices, but problems sometimes occur. Call your caregiver if you have any problems or questions after your procedure. °HOME CARE INSTRUCTIONS °· You may shower the day after the procedure. Remove the bandage (dressing) and gently wash the site with plain soap and water. Gently pat the site dry. °· Do not apply powder or lotion to the site. °· Do not submerge the affected site in water for 3 to 5 days. °· Inspect the site at least twice daily. °· Do not flex or bend the affected arm for 24 hours. °· No lifting over 5 pounds (2.3 kg) for 5 days after your procedure. °· Do not drive home if you are discharged the same day of the procedure. Have someone else drive you. °· You may drive 24 hours after the procedure unless otherwise instructed by your caregiver. °· Do not operate machinery or power tools for 24 hours. °· A responsible adult should be with you for the first 24 hours after you arrive home. °What to expect: °· Any bruising will usually fade within 1 to 2 weeks. °· Blood that collects in the tissue (hematoma) may be painful to the touch. It should usually decrease in size and tenderness within 1 to 2 weeks. °SEEK IMMEDIATE MEDICAL CARE IF: °· You have unusual pain at the radial site. °· You have redness, warmth, swelling, or pain at the radial site. °· You have drainage (other than a small amount of blood on the dressing). °· You have chills. °· You have a fever or persistent symptoms for more than 72 hours. °· You have a fever and your symptoms suddenly get worse. °· Your arm becomes pale, cool, tingly, or numb. °· You have heavy bleeding from the site. Hold pressure on the site. °Document Released: 09/20/2010 Document Revised:  11/10/2011 Document Reviewed: 09/20/2010 °ExitCare® Patient Information ©2015 [employer]. This information is not intended to replace advice given to you by your health care provider. Make sure you discuss any questions you have with your health care provider. ° °

## 2015-03-23 ENCOUNTER — Telehealth: Payer: Self-pay | Admitting: Cardiology

## 2015-03-23 ENCOUNTER — Encounter (HOSPITAL_COMMUNITY): Payer: Self-pay | Admitting: Cardiovascular Disease

## 2015-03-23 NOTE — Progress Notes (Signed)
Patient significant other came to nurses station and stated patient has changed his mind and would like to be discharged when RN can discharge patient.

## 2015-03-23 NOTE — Progress Notes (Signed)
TR band removed at this time.  Site was oozing scant amount of blood when band removed.  RN held pressure to site and oozing controlled at this time.  Site covered with gauze and transparent.

## 2015-03-23 NOTE — Telephone Encounter (Signed)
Medication samples have been provided to the patient.  Drug name: brilinta 15  Qty: 48  LOT: [id number]  Exp.Date: 07/2017  Samples left at front desk for patient pick-up. Patient notified.  Fidel Levy 8:50 AM 03/23/2015

## 2015-03-23 NOTE — Progress Notes (Signed)
Telemetry notified that RN was going to remove telemetry monitor because patient was being discharged.  RN reviewed patient's discharge instructions with patient and patient's significant other, all questions answered at time of discharge.  Telemetry monitor removed and IV discontinued.  No bleeding noted from IV site.  IV site covered with gauze and tape.    Gauze and transparent dressing to radial cath site clean, dry and intact at time of discharge, level 0.  Patient and RN both signed a copy of patient's discharge instructions and that copy placed in patients chart.  Copy of patient's discharge instructions sent with patient.

## 2015-03-23 NOTE — Progress Notes (Signed)
Patient still with discharge order, RN offered to discharge patient at this time.  Patient stated he would wait until later this morning.

## 2015-03-23 NOTE — Telephone Encounter (Signed)
Andrew Huerta is asking if he can get some samples of Brilinta 80mg  Please call   Thanks

## 2015-03-27 ENCOUNTER — Telehealth: Payer: Self-pay | Admitting: Cardiology

## 2015-03-27 ENCOUNTER — Ambulatory Visit: Payer: Medicaid Other | Attending: Family Medicine | Admitting: Family Medicine

## 2015-03-27 ENCOUNTER — Encounter: Payer: Self-pay | Admitting: Family Medicine

## 2015-03-27 ENCOUNTER — Other Ambulatory Visit: Payer: Self-pay | Admitting: Family Medicine

## 2015-03-27 VITALS — BP 114/83 | HR 75 | Temp 98.2°F | Resp 16 | Ht 67.0 in | Wt 166.0 lb

## 2015-03-27 DIAGNOSIS — K219 Gastro-esophageal reflux disease without esophagitis: Secondary | ICD-10-CM | POA: Diagnosis not present

## 2015-03-27 DIAGNOSIS — Z79899 Other long term (current) drug therapy: Secondary | ICD-10-CM | POA: Insufficient documentation

## 2015-03-27 DIAGNOSIS — F419 Anxiety disorder, unspecified: Secondary | ICD-10-CM | POA: Diagnosis not present

## 2015-03-27 DIAGNOSIS — IMO0002 Reserved for concepts with insufficient information to code with codable children: Secondary | ICD-10-CM

## 2015-03-27 DIAGNOSIS — I10 Essential (primary) hypertension: Secondary | ICD-10-CM | POA: Insufficient documentation

## 2015-03-27 DIAGNOSIS — I2510 Atherosclerotic heart disease of native coronary artery without angina pectoris: Secondary | ICD-10-CM | POA: Diagnosis present

## 2015-03-27 DIAGNOSIS — Z955 Presence of coronary angioplasty implant and graft: Secondary | ICD-10-CM | POA: Insufficient documentation

## 2015-03-27 DIAGNOSIS — K449 Diaphragmatic hernia without obstruction or gangrene: Secondary | ICD-10-CM | POA: Diagnosis not present

## 2015-03-27 DIAGNOSIS — F329 Major depressive disorder, single episode, unspecified: Secondary | ICD-10-CM | POA: Insufficient documentation

## 2015-03-27 DIAGNOSIS — E785 Hyperlipidemia, unspecified: Secondary | ICD-10-CM | POA: Diagnosis not present

## 2015-03-27 DIAGNOSIS — K2270 Barrett's esophagus without dysplasia: Secondary | ICD-10-CM

## 2015-03-27 DIAGNOSIS — M1710 Unilateral primary osteoarthritis, unspecified knee: Secondary | ICD-10-CM

## 2015-03-27 DIAGNOSIS — Z7982 Long term (current) use of aspirin: Secondary | ICD-10-CM | POA: Insufficient documentation

## 2015-03-27 MED ORDER — METOPROLOL TARTRATE 25 MG PO TABS: 25.0000 mg | ORAL_TABLET | Freq: Two times a day (BID) | ORAL | Status: DC

## 2015-03-27 MED ORDER — ATORVASTATIN CALCIUM 80 MG PO TABS: 80.0000 mg | ORAL_TABLET | Freq: Every day | ORAL | Status: DC

## 2015-03-27 MED ORDER — ESOMEPRAZOLE MAGNESIUM 40 MG PO CPDR
40.0000 mg | DELAYED_RELEASE_CAPSULE | Freq: Two times a day (BID) | ORAL | Status: DC
Start: 2015-03-27 — End: 2015-08-06

## 2015-03-27 MED ORDER — TICAGRELOR 90 MG PO TABS: 90.0000 mg | ORAL_TABLET | Freq: Two times a day (BID) | ORAL | Status: DC

## 2015-03-27 NOTE — Telephone Encounter (Signed)
Asking for refill on his hydrocodone rx.

## 2015-03-27 NOTE — Patient Instructions (Signed)
Coronary Angiogram with Stent °Coronary angiography with stent placement is a procedure to widen or open a narrow blood vessel of the heart (coronary artery). When a coronary artery becomes partially blocked, it decreases blood flow to that area. This may lead to chest pain or a heart attack (myocardial infarction). Arteries may become blocked by cholesterol buildup (plaque) in the lining or wall.  °A stent is a small piece of metal that looks like a mesh or a spring. Stent placement may be done right after a coronary angiography in which a blocked artery is found or as a treatment for a heart attack.  °LET YOUR HEALTH CARE PROVIDER KNOW ABOUT: °· Any allergies you have.   °· All medicines you are taking, including vitamins, herbs, eye drops, creams, and over-the-counter medicines.   °· Previous problems you or members of your family have had with the use of anesthetics.   °· Any blood disorders you have.   °· Previous surgeries you have had.   °· Medical conditions you have. °RISKS AND COMPLICATIONS °Generally, coronary angiography with stent is a safe procedure. However, problems can occur and include: °· Damage to the heart or its blood vessels.   °· A return of blockage.   °· Bleeding, infection, or bruising at the insertion site.   °· A collection of blood under the skin (hematoma) at the insertion site. °· Blood clot in another part of the body.   °· Kidney injury.   °· Allergic reaction to the dye or contrast used.   °· Bleeding into the abdomen (retroperitoneal bleeding). °BEFORE THE PROCEDURE °· Do not eat or drink anything after midnight on the night before the procedure or as directed by your health care provider.  °· Ask your health care provider about changing or stopping your regular medicines. This is especially important if you are taking diabetes medicines or blood thinners. °· Your health care provider will make sure you understand the procedure as well as the risks and potential problems  associated with the procedure.   °PROCEDURE °· You may be given a medicine to help you relax before and during the procedure (sedative). This medicine will be given through an IV tube that is put into one of your veins.   °· The area where the catheter will be inserted will be shaved and cleaned. This is usually done in the groin but may be done in the fold of your arm (near your elbow) or in the wrist.    °· A medicine will be given to numb the area where the catheter will be inserted (local anesthetic).   °· The catheter will be inserted into an artery using a guide wire. A type of X-ray (fluoroscopy) will be used to help guide the catheter to the opening of the blocked artery.   °· A dye will then be injected into the catheter, and X-rays will be taken. The dye will help to show where any narrowing or blockages are located in the heart arteries.   °· A tiny wire will be guided to the blocked spot, and a balloon will be inflated to make the artery wider. The stent will be expanded and will crush the plaque into the wall of the vessel. The stent will hold the area open like a scaffolding and improve the blood flow.   °· Sometimes the artery may be made wider using a laser or other tools to remove plaque.   °· When the blood flow is better, the catheter will be removed. The lining of the artery will grow over the stent, which stays where it was placed.   °  AFTER THE PROCEDURE °· If the procedure is done through the leg, you will be kept in bed lying flat for about 6 hours. You will be instructed to not bend or cross your legs.   °· The insertion site will be checked frequently.   °· The pulse in your feet or wrist will be checked frequently.   °· Additional blood tests, X-rays, and electrocardiography may be done. °Document Released: 02/22/2003 Document Revised: 01/02/2014 Document Reviewed: 02/24/2013 °ExitCare® Patient Information ©2015 [employer]. This information is not intended to replace advice given to you  by your health care provider. Make sure you discuss any questions you have with your health care provider. ° °

## 2015-03-27 NOTE — Telephone Encounter (Signed)
D/C phone call ... Appt on 06/21/15 at 11am w/ Dr.Jordan  Thanks

## 2015-03-27 NOTE — Telephone Encounter (Signed)
Patient contacted regarding discharge from [facility] on 03/22/2015.  Patient understands to follow up with provider Dr Martinique on 06/21/2015  at 11:00am at [facility]. Patient understands discharge instructions? yes  Patient understands medications and regiment? yes  Patient understands to bring all medications to this visit? yes  Patient requested samples of Brilinta, some were placed up front for patient last week, patient notified.  patient also stated he is still having chest pain but not as bad as when he went to the hospital.

## 2015-03-27 NOTE — Progress Notes (Signed)
Patient here for HFU for chest pain. Patient reports his chest pain has not been as bad as it was when he went to the ED. Patient reports chest pain today rated at a 3. Pain located on left side of chest described as "feels like someone is punching me." Pain comes and goes and has been present for 3 years now. Patient has taken aspirin, metoprolol, and prilosec, which he states he got from his girlfriend, this morning. Patient has not been taking lipitor because he states he can't afford it. Patient needs a refill on brilinta. Patient reports he took his last does of brilinta last night and he cannot be without this medication. Patient also needs a refill on lipitor if he is able to get it for an affordable price. Patient reports he is suppose to be on nexium for his Barretts disease but is not on it.

## 2015-03-27 NOTE — Progress Notes (Signed)
Patient ID: ROBBIN ESCHER, male   DOB: October 15, 1965, 49 y.o.   MRN: [id number]    Jerik Falletta, is a 49 y.o. male  SWF:[id number]  UKG:[id number]  DOB - 08/05/1966  Admit date: 03/21/15 Discharge date: 03/22/59  CC:  Chief Complaint  Patient presents with  . Hospitalization Follow-up       HPI: Elgie Maziarz is a 49 y.o. male with a history of two-vessel coronary artery disease status post stenting of the mid LAD and mid LCx in 2015 (on Brilinta for the past 11 months), chronic chest pain, history of resection of pheochromocytoma in 2015, Barrett's esophagus who presented to the ED with chest pains.  His troponins were negative 3, EKG revealed sinus tachycardia at rate of 101 with fusion complexes. He had admitted to being compliant with aspirin and Brilinta but had run out of all his other medications. He was seen by cardiology and his previous workup reviewed; he had a normal stress Myoview in 07/2014. He was taken to cardiac cath on 03/22/15 which revealed proximal LAD lesion, 40% stenosed unchanged from previous study, patent mid LAD stent, left mid Cx lesion 10% stenosed, minimal diffuse in-stent restenosis, left distal Cx lesion 30% stenosed, normal left ventricular systolic function. He Statin and beta blocker were resumed; he was scheduled for follow-up in the clinic and with his cardiologist Dr. Martinique in a few months.   Interval history: He feels fine today and has no complaints. He underwent an upper endoscopy in 12/2014 by his gastroenterologist Dr Watt Climes and was informed he was good for the next 2 years. He does have some soreness in his right forearm at the site of the cardiac cath.   Allergies  Allergen Reactions  . Cortisone Acetate Swelling  . Darvocet [Propoxyphene N-Acetaminophen] Nausea And Vomiting  . Nsaids Other (See Comments)    Caused stomach ulcers in the past   . Xanax [Alprazolam] Other (See Comments)    Pt states causes him to be mean   Past Medical  History  Diagnosis Date  . Barrett's esophagus     EGD - 11/27/09 - short segment of Barrett's  . Hyperlipidemia   . Anxiety   . Low back pain   . GERD (gastroesophageal reflux disease)   . Hiatal hernia     EGD - 11/27/2009  . Depression   . Adrenal tumor     a. s/p adrenal gland resection at [facility]. left side removal per patient  . Hypertension   . Sleep apnea   . Stone, kidney   . CAD (coronary artery disease)     a. 04/27/14 [place] s/p DES to mLAD and DES to mLCx  . Chronic chest pain   . Degenerative joint disease     Bilateral knees. Significant knee pain since playing football in high school. also in back per patient  . Chronic back pain     upper and lower per patient   Current Outpatient Prescriptions on File Prior to Visit  Medication Sig Dispense Refill  . aspirin EC 81 MG tablet Take 81 mg by mouth daily.    Marland Kitchen HYDROcodone-acetaminophen (NORCO/VICODIN) 5-325 MG per tablet Take 1-2 tablets by mouth every 8 (eight) hours as needed for moderate pain. Do not fill until 03/02/2015 90 tablet 0  . metoprolol tartrate (LOPRESSOR) 25 MG tablet Take 1 tablet (25 mg total) by mouth 2 (two) times daily. 60 tablet 11  . ticagrelor (BRILINTA) 90 MG TABS tablet Take 90 mg by mouth 2 (two)  times daily.    Marland Kitchen atorvastatin (LIPITOR) 80 MG tablet Take 1 tablet (80 mg total) by mouth daily at 6 PM. (Patient not taking: Reported on 03/27/2015) 30 tablet 11  . nitroGLYCERIN (NITROSTAT) 0.4 MG SL tablet Place 1 tablet (0.4 mg total) under the tongue every 5 (five) minutes as needed for chest pain. (Patient not taking: Reported on 03/27/2015) 25 tablet 2  . ranitidine (ZANTAC) 150 MG tablet Take 1 tablet (150 mg total) by mouth daily. (Patient not taking: Reported on 03/27/2015)     No current facility-administered medications on file prior to visit.   Family History  Problem Relation Age of Onset  . Cancer Father     stomach cancer  . Diabetes Father   . Heart attack Paternal Grandfather   . Diabetes  Paternal Grandmother    History   Social History  . Marital Status: Divorced    Spouse Name: N/A  . Number of Children: N/A  . Years of Education: N/A   Occupational History  . Unemployed    Social History Main Topics  . Smoking status: Never Smoker   . Smokeless tobacco: Never Used  . Alcohol Use: Yes     Comment: "seldom" per patient  . Drug Use: No     Comment: "a long time ago" per patient  . Sexual Activity: Yes    Birth Control/ Protection: None     Comment: "same partner for fourteen years" per patient   Other Topics Concern  . Not on file   Social History Narrative   Unemployed.    Single dad. One of his sons has ADHD.   2 grandparents died of CAD in their 87s, otherwise no family history of premature CAD.    Review of Systems: Constitutional: Negative for fever, chills, diaphoresis, activity change, appetite change and fatigue. HENT: Negative for ear pain, nosebleeds, congestion, facial swelling, rhinorrhea, neck pain, neck stiffness and ear discharge.  Eyes: Negative for pain, discharge, redness, itching and visual disturbance. Respiratory: Negative for cough, choking, chest tightness, shortness of breath, wheezing and stridor.  Cardiovascular: Negative for chest pain, palpitations and leg swelling. Gastrointestinal: Negative for abdominal distention. Genitourinary: Negative for dysuria, urgency, frequency, hematuria, flank pain, decreased urine volume, difficulty urinating and dyspareunia.  Musculoskeletal: see hpi Neurological: Negative for dizziness, tremors, seizures, syncope, facial asymmetry, speech difficulty, weakness, light-headedness, numbness and headaches.  Hematological: Negative for adenopathy. Does not bruise/bleed easily. Skin: Negative for rash, ulcer. Psychiatric/Behavioral: Negative for hallucinations, behavioral problems, confusion, dysphoric mood, decreased concentration and agitation.    Objective:   Filed Vitals:   03/27/15 0952    BP: 114/83  Pulse: 75  Temp: 98.2 F (36.8 C)  Resp: 16    Physical Exam: Constitutional: Patient appears well-developed and well-nourished. No distress. HENT: Normocephalic, atraumatic, External right and left ear normal. Oropharynx is clear and moist.  Eyes: Conjunctivae and EOM are normal. PERRLA, no scleral icterus. Neck: Normal ROM, No JVD. No tracheal deviation. No thyromegaly. CVS: RRR, S1/S2 +, no murmurs, no gallops, no carotid bruit.  Pulmonary: Effort and breath sounds normal, no stridor, rhonchi, wheezes, rales.  Abdominal: Soft. BS +, no distension, tenderness, rebound or guarding.  Musculoskeletal: Normal range of motion, mild tenderness on palpation of flexor aspect of forearm. Lymphadenopathy: No lymphadenopathy noted, cervical, inguinal or axillary Neuro: Alert. Normal reflexes, muscle tone coordination. No cranial nerve deficit. Skin: Skin is warm and dry. No rash noted. Not diaphoretic. No erythema. No pallor. Psychiatric: Normal mood and affect. Behavior,  judgment, thought content normal.  Lab Results  Component Value Date   WBC 10.5 03/21/2015   HGB 13.1 03/21/2015   HCT 38.4* 03/21/2015   MCV 89.3 03/21/2015   PLT 222 03/21/2015   Lab Results  Component Value Date   CREATININE 0.97 03/22/2015   BUN 16 03/22/2015   NA 140 03/22/2015   K 3.9 03/22/2015   CL 108 03/22/2015   CO2 27 03/22/2015    Lab Results  Component Value Date   HGBA1C 5.7 11/06/2011   Lipid Panel     Component Value Date/Time   CHOL 126 09/19/2014 1510   TRIG 78 09/19/2014 1510   HDL 56 09/19/2014 1510   CHOLHDL 2.3 09/19/2014 1510   VLDL 16 09/19/2014 1510   LDLCALC 54 09/19/2014 1510       Assessment and plan:  49 year old male with a history of two-vessel coronary artery disease status post stenting of the mid LAD and mid LCx in 2015 (on Brilinta for the past 11 months), Barrett's esophagus, history of resection of pheochromocytoma in 2015, chronic chest pain s/p  cardiac cath which revealed non obstructive CAD  CAD s/p stent: Prescription for Brilinta sent to the pharmacy on site which will be cost effective for him. Aggressive risk factor modification; continue statin. Advised to keep appointment with his cardiologist-Dr. Martinique.   Barrett's esophagus: Status post EGD. Previously on Nexium which he had not been taking due to cost and have refilled that today. I have discussed with him the interaction between PPI and reduction in the effects of Brilinta but he tells me his cardiologist and GI specialist when agreement that he continue with that.  Hypertension: Controlled  Medications refilled.       The patient was given clear instructions to go to ER or return to medical center if symptoms don't improve, worsen or new problems develop. The patient verbalized understanding. The patient was told to call to get lab results if they haven't heard anything in the next week.     Arnoldo Morale, [facility] [phone] 03/27/2015, 10:10 AM

## 2015-03-28 ENCOUNTER — Other Ambulatory Visit: Payer: Self-pay | Admitting: Family Medicine

## 2015-03-28 DIAGNOSIS — IMO0002 Reserved for concepts with insufficient information to code with codable children: Secondary | ICD-10-CM

## 2015-03-28 DIAGNOSIS — M1710 Unilateral primary osteoarthritis, unspecified knee: Secondary | ICD-10-CM

## 2015-03-28 MED ORDER — HYDROCODONE-ACETAMINOPHEN 5-325 MG PO TABS: 1.0000 | ORAL_TABLET | Freq: Three times a day (TID) | ORAL | Status: DC | PRN

## 2015-04-04 ENCOUNTER — Ambulatory Visit: Payer: Medicaid Other | Attending: Internal Medicine | Admitting: Clinical

## 2015-04-04 DIAGNOSIS — F419 Anxiety disorder, unspecified: Principal | ICD-10-CM

## 2015-04-04 DIAGNOSIS — F418 Other specified anxiety disorders: Secondary | ICD-10-CM

## 2015-04-04 DIAGNOSIS — F329 Major depressive disorder, single episode, unspecified: Secondary | ICD-10-CM

## 2015-04-04 NOTE — Progress Notes (Signed)
ASSESSMENT: Pt currently experiencing symptoms of anxiety and depression. Pt needs to f/u with PCP and [facility]; he would benefit from psychoeducation and supportive counseling regarding coping with symptoms of anxiety and depression.  Stage of Change: precontemplative  PLAN: 1. F/U with behavioral health consultant in 3  weeks 2. Psychiatric Medications: none currently. 3. Behavioral recommendation(s):   -Consider reading educational material regarding coping with symptoms of anxiety and depression -Consider BH meds at next visit SUBJECTIVE: Pt. referred by Dr Jarold Song for symptoms of anxiety and depression:  Pt. reports the following symptoms/concerns: Pt states that he is feeling a bit better than at last PCP visit, but that he is feeling down over his current financial and lifestyle changes, as a result of increasing health issues, filing for disability, and coping with past traumas. Duration of problem: <a year Severity: moderate  OBJECTIVE: Orientation & Cognition: Oriented x3. Thought processes normal and appropriate to situation. Mood: teary. Affect: appropriate Appearance: appropriate Risk of harm to self or others: no risk of harm to self or others Substance use: none Assessments administered: PHQ9-16/ GAD7- 17  Diagnosis: Anxiety and depression CPT Code: F41.8 -------------------------------------------- Other(s) present in the room: none  Time spent with patient in exam room: 60 minutes

## 2015-04-23 ENCOUNTER — Telehealth: Payer: Self-pay | Admitting: Cardiology

## 2015-04-23 ENCOUNTER — Telehealth: Payer: Self-pay | Admitting: Family Medicine

## 2015-04-23 NOTE — Telephone Encounter (Signed)
Pt called in stating that since 8/19 he has been having some swelling and knots in his left arm. He is not sure what is causing this but he would like to be advised on what to do. I offered him the appt with Dr. Martinique for this afternoon at 4pm but he refused stating that he does not have any insurance. He says that he would not be able to get an appt with his PCP until Friday and would like to know should he wait until then or go to the ER. Please f/u with pt  Thanks

## 2015-04-23 NOTE — Telephone Encounter (Signed)
Pt describes 2 nodules/bead-like spots under skin of left arm. Spots are painful to touch. Advised to follow up w/ PCP. Pt voiced understanding.  Also called about samples - confirmed Brilinta samples still available at front desk (initial request call was 1 month ago). Pt voiced understanding.

## 2015-04-23 NOTE — Telephone Encounter (Signed)
Patient called stating that he has swelling on his left arm. Please f/u with pt.

## 2015-04-27 ENCOUNTER — Ambulatory Visit (HOSPITAL_COMMUNITY)
Admission: RE | Admit: 2015-04-27 | Discharge: 2015-04-27 | Disposition: A | Discharge status: Home / Self Care | Payer: Medicaid Other | Source: Ambulatory Visit | Attending: Family Medicine | Admitting: Family Medicine

## 2015-04-27 ENCOUNTER — Encounter: Payer: Self-pay | Admitting: Family Medicine

## 2015-04-27 ENCOUNTER — Ambulatory Visit (HOSPITAL_BASED_OUTPATIENT_CLINIC_OR_DEPARTMENT_OTHER): Payer: Medicaid Other | Admitting: Clinical

## 2015-04-27 ENCOUNTER — Ambulatory Visit: Payer: Medicaid Other | Attending: Family Medicine | Admitting: Family Medicine

## 2015-04-27 VITALS — BP 113/79 | HR 85 | Temp 98.6°F | Resp 16 | Ht 67.0 in | Wt 166.0 lb

## 2015-04-27 DIAGNOSIS — M549 Dorsalgia, unspecified: Secondary | ICD-10-CM

## 2015-04-27 DIAGNOSIS — M545 Low back pain: Secondary | ICD-10-CM | POA: Diagnosis present

## 2015-04-27 DIAGNOSIS — M79632 Pain in left forearm: Secondary | ICD-10-CM | POA: Insufficient documentation

## 2015-04-27 DIAGNOSIS — F329 Major depressive disorder, single episode, unspecified: Secondary | ICD-10-CM | POA: Diagnosis not present

## 2015-04-27 DIAGNOSIS — G8929 Other chronic pain: Secondary | ICD-10-CM

## 2015-04-27 DIAGNOSIS — I2510 Atherosclerotic heart disease of native coronary artery without angina pectoris: Secondary | ICD-10-CM | POA: Diagnosis not present

## 2015-04-27 DIAGNOSIS — D497 Neoplasm of unspecified behavior of endocrine glands and other parts of nervous system: Secondary | ICD-10-CM

## 2015-04-27 DIAGNOSIS — M7989 Other specified soft tissue disorders: Secondary | ICD-10-CM | POA: Diagnosis not present

## 2015-04-27 DIAGNOSIS — F418 Other specified anxiety disorders: Secondary | ICD-10-CM

## 2015-04-27 DIAGNOSIS — F331 Major depressive disorder, recurrent, moderate: Secondary | ICD-10-CM

## 2015-04-27 MED ORDER — DULOXETINE HCL 30 MG PO CPEP: 30.0000 mg | ORAL_CAPSULE | Freq: Every day | ORAL | Status: DC

## 2015-04-27 NOTE — Progress Notes (Signed)
ASSESSMENT: Pt currently experiencing symptoms of anxiety and depression. Pt needs to f/u with PCP and [facility]; would benefit from continued psychoeducation and supportive counseling regarding coping with symptoms of anxiety and depression.  Stage of Change: precontemplative  PLAN: 1. F/U with behavioral health consultant in as needed 2. Psychiatric Medications: Cymbalta (starting today). 3. 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 referred by Dr Adrian Blackwater for symptoms of anxiety and depression:  Pt. reports the following symptoms/concerns: Pt states that he does not think he will ever be without pain, and that what he wants most is to be able to do something useful with his time; he used to work 80hrs/week, and likes to [employer]. He thinks he would be able to work at least 5 hours/week to bring in some income and be productive.  Duration of problem: "years" Severity: moderate  OBJECTIVE: Orientation & Cognition: Oriented x3. Thought processes normal and appropriate to situation. Mood: teary. Affect: appropriate Appearance: appropriate Risk of harm to self or others: no risk of harm to self or others today, has had thoughts of hurting others in the past Substance use: none Assessments administered: PHQ9: 17/ GAD7: 19  Diagnosis: Anxiety and depression CPT Code: 41.8 -------------------------------------------- Other(s) present in the room: none  Time spent with patient in exam room: 45 minutes

## 2015-04-27 NOTE — Assessment & Plan Note (Signed)
A; major depression with chronic pain  P:  Start cymbalta Close follow up in 203 weeks
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
## 2015-04-27 NOTE — Patient Instructions (Addendum)
Andrew Huerta,  Thank you for coming in today. It was a pleasure meeting you. I look forward to being your primary doctor.   1. Depression and chronic back pain: Continue vicodin from family medicine, if they are unable to continue vicodin tylenol #3 and tramadol are two options I can prescribe  Add cymbalta 30 mg daily  Low back x-ray to evaluate joints and disc space    2. L forearm bruising with pain and swelling: Improving No evidence of serious injury or infection Ice as needed  Continue to monitor, if swelling increases plan for venous ultrasound of arm    F/u in 2 weeks for chronic pain and depression, starting cymbalta  Dr. Adrian Blackwater

## 2015-04-27 NOTE — Assessment & Plan Note (Signed)
S/p tumor removal

## 2015-04-27 NOTE — Assessment & Plan Note (Signed)
Depression and chronic back pain: Continue vicodin from family medicine, if they are unable to continue vicodin tylenol #3 and tramadol are two options I can prescribe  Add cymbalta 30 mg daily  Low back x-ray to evaluate joints and disc space

## 2015-04-27 NOTE — Assessment & Plan Note (Signed)
L forearm bruising with pain and swelling: Improving No evidence of serious injury or infection Ice as needed  Continue to monitor, if swelling increases plan for venous ultrasound of arm

## 2015-05-02 NOTE — Telephone Encounter (Signed)
Date of birth verified by Pt Normal lab results given to pt Stated arm is better now, swelling is down

## 2015-05-02 NOTE — Telephone Encounter (Signed)
-----   Message from Boykin Nearing, MD sent at 04/27/2015  2:09 PM EDT ----- Normal low back x-ray w/o signigicant degeneration

## 2015-05-18 ENCOUNTER — Encounter: Payer: Self-pay | Admitting: Family Medicine

## 2015-05-18 ENCOUNTER — Ambulatory Visit (HOSPITAL_BASED_OUTPATIENT_CLINIC_OR_DEPARTMENT_OTHER): Payer: Medicaid Other | Admitting: Family Medicine

## 2015-05-18 ENCOUNTER — Ambulatory Visit: Payer: Medicaid Other | Attending: Family Medicine | Admitting: Clinical

## 2015-05-18 VITALS — BP 113/78 | HR 78 | Temp 97.7°F | Resp 16 | Ht 67.0 in | Wt 171.0 lb

## 2015-05-18 DIAGNOSIS — I2510 Atherosclerotic heart disease of native coronary artery without angina pectoris: Secondary | ICD-10-CM | POA: Insufficient documentation

## 2015-05-18 DIAGNOSIS — R079 Chest pain, unspecified: Secondary | ICD-10-CM | POA: Diagnosis not present

## 2015-05-18 DIAGNOSIS — G8929 Other chronic pain: Secondary | ICD-10-CM

## 2015-05-18 DIAGNOSIS — F331 Major depressive disorder, recurrent, moderate: Secondary | ICD-10-CM | POA: Diagnosis not present

## 2015-05-18 DIAGNOSIS — F329 Major depressive disorder, single episode, unspecified: Secondary | ICD-10-CM

## 2015-05-18 DIAGNOSIS — Z114 Encounter for screening for human immunodeficiency virus [HIV]: Secondary | ICD-10-CM | POA: Diagnosis not present

## 2015-05-18 DIAGNOSIS — F418 Other specified anxiety disorders: Secondary | ICD-10-CM

## 2015-05-18 DIAGNOSIS — M549 Dorsalgia, unspecified: Secondary | ICD-10-CM | POA: Insufficient documentation

## 2015-05-18 DIAGNOSIS — F419 Anxiety disorder, unspecified: Secondary | ICD-10-CM

## 2015-05-18 LAB — POCT URINALYSIS DIPSTICK
Bilirubin, UA: NEGATIVE
Blood, UA: NEGATIVE
Glucose, UA: NEGATIVE
KETONES UA: NEGATIVE
LEUKOCYTES UA: NEGATIVE
NITRITE UA: NEGATIVE
PH UA: 7.5
PROTEIN UA: NEGATIVE
Spec Grav, UA: 1.015
Urobilinogen, UA: 0.2

## 2015-05-18 LAB — BASIC METABOLIC PANEL
BUN: 15 mg/dL (ref 7–25)
CALCIUM: 9.4 mg/dL (ref 8.6–10.3)
CO2: 27 mmol/L (ref 20–31)
CREATININE: 0.98 mg/dL (ref 0.60–1.35)
Chloride: 103 mmol/L (ref 98–110)
Glucose, Bld: 131 mg/dL — ABNORMAL HIGH (ref 65–99)
POTASSIUM: 4.7 mmol/L (ref 3.5–5.3)
Sodium: 141 mmol/L (ref 135–146)

## 2015-05-18 LAB — CBC
HCT: 39.4 % (ref 39.0–52.0)
Hemoglobin: 13.6 g/dL (ref 13.0–17.0)
MCH: 30.6 pg (ref 26.0–34.0)
MCHC: 34.5 g/dL (ref 30.0–36.0)
MCV: 88.7 fL (ref 78.0–100.0)
MPV: 10.6 fL (ref 8.6–12.4)
Platelets: 219 10*3/uL (ref 150–400)
RBC: 4.44 MIL/uL (ref 4.22–5.81)
RDW: 13.8 % (ref 11.5–15.5)
WBC: 12.2 10*3/uL — AB (ref 4.0–10.5)

## 2015-05-18 NOTE — Progress Notes (Signed)
ASSESSMENT: Pt currently experiencing anxiety and depression; needs to see psychiatrist for [facility] medication management and would benefit from continued supportive counseling regarding coping with symptoms of anxiety and depression.  Stage of Change: contemplative  PLAN: 1. F/U with behavioral health consultant in as needed 2. Psychiatric Medications: Celexa, discontinuing . 3. Behavioral recommendation(s):   -Go to [facility] for [facility] med management   SUBJECTIVE: Pt. referred by Dr Adrian Blackwater for anxiety:  Pt. reports the following symptoms/concerns: Pt says that he does not like the way that Celexa makes him feel, just as he does not like the way Elavil and other anxiety medications (unknown) have made him feel; as a single parent, he wants to be fully awake and functioning for his children. Pt says he will go to [facility] and talk to a psychiatrist about any other medications that may help him with anxiety.  Duration of problem: less than one month Severity: severe  OBJECTIVE: Orientation & Cognition: Oriented x3. Thought processes normal and appropriate to situation. Mood: appropriate. Affect: appropriate Appearance: appropriate Risk of harm to self or others: no risk of harm to self or others Substance use: none Assessments administered: PHQ9:14/ [id number]  Diagnosis: Anxiety and depression CPT Code: F41.8 -------------------------------------------- Other(s) present in the room: none  Time spent with patient in exam room: 10 minutes

## 2015-05-18 NOTE — Progress Notes (Signed)
F/U Depression, and chronic back pain  Stated bruise with out injury  Pain scale #8 No tobacco user

## 2015-05-18 NOTE — Patient Instructions (Addendum)
Mr. Crescenzo,  1. Depression and chronic pain: Removed cymbalta from med list  Counseling and treatment services available at [facility] of [place], [place] and [place].   2. Bruising: Your blood is thin on brilinta Checking CBC and BMP  3. Screening HIV ordered   4. L flank pain: UA is normal. There is no blood or evidence of infection in urine.   F/u with me in 3 months

## 2015-05-18 NOTE — Progress Notes (Signed)
Patient ID: Andrew Huerta, male   DOB: April 26, 1966, 49 y.o.   MRN: [id number]   Subjective:  Patient ID: Andrew Huerta, male    DOB: 1965-09-26  Age: 49 y.o. MRN: [id number]  CC: Depression and Back Pain  HPI Andrew Huerta presents for depression and chronic pain.  1. Depression: patient did not tolerate cymbalta. Patient reports cymbalta caused fatigue, yawning, nausea. He had similar intolerance with elavil. He is amenable to mental health.   2. Chronic pain: taking vicodin. Has pain in back and knees mostly. But has pain all over. He has increased pain in his L flank lately. He denies dysuria and hematuria. He has hx of kidney stones.  3. Bruising: bruises on his arms. Slightly tender. No known trauma. On brilinta.    Outpatient Prescriptions Prior to Visit  Medication Sig Dispense Refill  . aspirin EC 81 MG tablet Take 81 mg by mouth daily.    Marland Kitchen atorvastatin (LIPITOR) 80 MG tablet Take 1 tablet (80 mg total) by mouth daily at 6 PM. 30 tablet 3  . DULoxetine (CYMBALTA) 30 MG capsule Take 1 capsule (30 mg total) by mouth daily. 30 capsule 3  . esomeprazole (NEXIUM) 40 MG capsule Take 1 capsule (40 mg total) by mouth 2 (two) times daily before a meal. 60 capsule 3  . HYDROcodone-acetaminophen (NORCO/VICODIN) 5-325 MG per tablet Take 1-2 tablets by mouth every 8 (eight) hours as needed for moderate pain. Do not fill until 05/03/2015 90 tablet 0  . metoprolol tartrate (LOPRESSOR) 25 MG tablet Take 1 tablet (25 mg total) by mouth 2 (two) times daily. 60 tablet 3  . nitroGLYCERIN (NITROSTAT) 0.4 MG SL tablet Place 1 tablet (0.4 mg total) under the tongue every 5 (five) minutes as needed for chest pain. 25 tablet 2  . ticagrelor (BRILINTA) 90 MG TABS tablet Take 1 tablet (90 mg total) by mouth 2 (two) times daily. 60 tablet 2   No facility-administered medications prior to visit.   Social History  Substance Use Topics  . Smoking status: Never Smoker   . Smokeless tobacco: Never Used  .  Alcohol Use: Yes     Comment: "seldom" per patient   ROS Review of Systems  Constitutional: Negative for fever, chills, fatigue and unexpected weight change.  Eyes: Negative for visual disturbance.  Respiratory: Negative for cough and shortness of breath.   Cardiovascular: Negative for chest pain, palpitations and leg swelling.  Gastrointestinal: Negative for nausea, vomiting, abdominal pain, diarrhea, constipation and blood in stool.  Endocrine: Negative for polydipsia, polyphagia and polyuria.  Musculoskeletal: Negative for myalgias, back pain, arthralgias, gait problem and neck pain.  Skin: Negative for rash.  Allergic/Immunologic: Negative for immunocompromised state.  Hematological: Negative for adenopathy. Does not bruise/bleed easily.  Psychiatric/Behavioral: Positive for dysphoric mood. Negative for suicidal ideas and sleep disturbance. The patient is nervous/anxious.   GAD-7: score of 14. 2 to all   Objective:  BP 113/78 mmHg  Pulse 78  Temp(Src) 97.7 F (36.5 C) (Oral)  Resp 16  Ht 5\' 7"  (1.702 m)  Wt 171 lb (77.565 kg)  BMI 26.78 kg/m2  SpO2 99%  BP/Weight 05/18/2015 04/27/2015 0/10/4915  Systolic BP 915 056 979  Diastolic BP 78 79 83  Wt. (Lbs) 171 166 166  BMI 26.78 25.99 25.99   Physical Exam  Constitutional: He appears well-developed and well-nourished. No distress.  Neck: Normal range of motion. Neck supple.  Cardiovascular: Normal rate, regular rhythm, normal heart sounds and intact distal  pulses.   Pulmonary/Chest: Effort normal and breath sounds normal.  Musculoskeletal: He exhibits no edema.       Arms: Neurological: He is alert.  Skin: Skin is warm and dry. Bruising noted. No rash noted. No erythema.  Scattered bruising on upper extremities   Psychiatric: He has a normal mood and affect.   UA: reviewed and normal  Assessment & Plan:   Problem List Items Addressed This Visit    CAD- 2 V PCI Aug 2015 (Chronic)    Bruising: Your blood is thin on  brilinta Checking CBC and BMP       Relevant Orders   CBC   Basic Metabolic Panel   Chest pain with moderate risk of acute coronary syndrome (Chronic)   Relevant Orders   CBC   Basic Metabolic Panel   Chronic back pain - Primary (Chronic)   Relevant Orders   POCT urinalysis dipstick   Major depression (Chronic)    Depression and chronic pain: Removed cymbalta from med list  Counseling and treatment services available at [facility] of [place], [place] and [place].           Other Visit Diagnoses    Screening for HIV (human immunodeficiency virus)        Relevant Orders    HIV antibody (with reflex)       No orders of the defined types were placed in this encounter.    Follow-up: Return in about 3 months (around 08/17/2015).   Boykin Nearing MD

## 2015-05-18 NOTE — Assessment & Plan Note (Signed)
Bruising: Your blood is thin on brilinta Checking CBC and BMP

## 2015-05-18 NOTE — Assessment & Plan Note (Signed)
Depression and chronic pain: Removed cymbalta from med list  Counseling and treatment services available at [facility] of [place], [place] and [place].

## 2015-05-19 LAB — HIV ANTIBODY (ROUTINE TESTING W REFLEX): HIV: NONREACTIVE

## 2015-05-23 ENCOUNTER — Telehealth: Payer: Self-pay | Admitting: *Deleted

## 2015-05-23 NOTE — Telephone Encounter (Signed)
-----   Message from Boykin Nearing, MD sent at 05/21/2015  1:59 PM EDT ----- Screening HIV negative Normal BMP Slightly elevated WBC on CBC  If patient develops fever or cold symptoms, he should f/u for further evaluation

## 2015-05-23 NOTE — Telephone Encounter (Signed)
Date of birth verified by pt Normal lab results given to pt  Slightly elevated WBC  Pt advised if develops Sx cold F/U with PCP for evaluation

## 2015-05-24 ENCOUNTER — Telehealth: Payer: Self-pay | Admitting: Family Medicine

## 2015-05-24 NOTE — Telephone Encounter (Signed)
Patient called to speak to nurse in regards to his blood work results, patient stated that he gets treated for cancer once every two years and would like to know if that has anything to do with his results. Patient Stated that he does not feel sick at the moment and is concerned.   Please f/u

## 2015-05-28 ENCOUNTER — Telehealth: Payer: Self-pay | Admitting: Clinical

## 2015-05-28 NOTE — Telephone Encounter (Signed)
Pt called, very worried that his "slightly elevated WBC" could mean cancer, and he wants to know why he hasn't been advised about what to do next to find out whether or not he has cancer; patient states that "half my family has died of cancer". Patient is told by [facility] that either his nurse or provider will contact him back and let him know what to do, and reminds patient that the notes say that the WBC count is "slightly elevated".

## 2015-05-29 ENCOUNTER — Other Ambulatory Visit: Payer: Self-pay | Admitting: Family Medicine

## 2015-05-29 DIAGNOSIS — M1710 Unilateral primary osteoarthritis, unspecified knee: Secondary | ICD-10-CM

## 2015-05-29 DIAGNOSIS — IMO0002 Reserved for concepts with insufficient information to code with codable children: Secondary | ICD-10-CM

## 2015-05-29 NOTE — Telephone Encounter (Signed)
Will forward to Dr. Lamar Benes.  Jazmin Hartsell,CMA

## 2015-05-29 NOTE — Telephone Encounter (Signed)
Pt called and needs refills on his Vicodin left up front. Please call patient when ready. jw

## 2015-05-29 NOTE — Telephone Encounter (Signed)
Called and spoke with patient. At this time I won't do a refill for this medicine because he has established PCP with [facility]. The reason he did this was because after a hospitalization in July and losing his medicaid he was told he could get Brilinta and other medications at a discount if he established there. That clinic does not do pain medications other than tramadol so they recommended getting refills for his pain medications from me. I discussed with him that our clinic is not a pain clinic, and that I would only continue prescribing this medicine with him if he was still our patient, and since [facility] provides the same services as our clinic it would need to be either one or the other. I recommended that he contact [facility], the pharmacy providing his medicines, and our clinic contact [facility] to see if he would still need to be a patient at the health and wellness center in order to be eligible for the discounted medicines. He stated that he would prefer to be at our clinic since that is where he has always been but was told he had to go to the new clinic for the medicines. I told him that after he speaks with everyone he would need an appointment with me before I would refill his pain medications. He had no further questions. -Dr. Lamar Benes

## 2015-05-30 ENCOUNTER — Other Ambulatory Visit: Payer: Self-pay | Admitting: Family Medicine

## 2015-05-30 DIAGNOSIS — IMO0002 Reserved for concepts with insufficient information to code with codable children: Secondary | ICD-10-CM

## 2015-05-30 DIAGNOSIS — M1710 Unilateral primary osteoarthritis, unspecified knee: Secondary | ICD-10-CM

## 2015-05-30 MED ORDER — HYDROCODONE-ACETAMINOPHEN 5-325 MG PO TABS: 1.0000 | ORAL_TABLET | Freq: Three times a day (TID) | ORAL | Status: DC | PRN

## 2015-05-30 NOTE — Progress Notes (Signed)
Spoke with patient and he is aware of this. Andrew Huerta,CMA  

## 2015-06-06 ENCOUNTER — Ambulatory Visit: Payer: Self-pay | Admitting: Family Medicine

## 2015-06-11 ENCOUNTER — Ambulatory Visit (INDEPENDENT_AMBULATORY_CARE_PROVIDER_SITE_OTHER): Payer: Self-pay | Admitting: Family Medicine

## 2015-06-11 VITALS — BP 117/79 | HR 77 | Temp 97.8°F | Wt 170.0 lb

## 2015-06-11 DIAGNOSIS — M170 Bilateral primary osteoarthritis of knee: Secondary | ICD-10-CM

## 2015-06-11 MED ORDER — HYDROCODONE-ACETAMINOPHEN 5-325 MG PO TABS: 1.0000 | ORAL_TABLET | Freq: Three times a day (TID) | ORAL | Status: DC | PRN

## 2015-06-11 NOTE — Progress Notes (Signed)
   Subjective:    Patient ID: Andrew Huerta, male    DOB: 03/23/1966, 49 y.o.   MRN: [id number]  HPI  CC: follow up   # Bilateral knee osteoarthritis:  Stable, no major changes.   ROS: no numbness/tingling of LE, no constipation  # Medication/insurance issues  Lost medicaid this past year  Was referred to [place] and [facility] for establishing PCP for more affordable medications follow hospitalization in July.  Has been going there for his medical issues  He would prefer to stay with [facility] as he has been a patient here for years  New medicaid application has been on hold for 2 months  Social Hx: never smoker  Review of Systems   See HPI for ROS.   Past medical history, surgical, family, and social history reviewed and updated in the EMR as appropriate. Objective:  BP 117/79 mmHg  Pulse 77  Temp(Src) 97.8 F (36.6 C) (Oral)  Wt 170 lb (77.111 kg) Vitals and nursing note reviewed  General: NAD MSK: Right knee bony deformities noted, overall irregular shape compared to left. ROM largely intact in both knees. Tender to palpation diffusely both knees. Able to ambulate and bear weight Neuro: alert and oriented, no deficits noted. Normal gait.  Assessment & Plan:  DJD-knees- needs TKR Stable. Discussed with him our clinic likely cannot continue managing this issue for him if we are not still his PCP as we are not a pain management clinic. He will speak with [facility] and hopefully get an orange card application (medicaid application seems to be on hold, not hopeful this will go through). Once he gets an orange card his medications could go through [facility] at health dept. Follow up in 2 months if stays [facility] patient, otherwise will need pain clinic referral from [facility] and wellness PCP for additional norco refills.

## 2015-06-12 ENCOUNTER — Telehealth: Payer: Self-pay | Admitting: Family Medicine

## 2015-06-12 NOTE — Assessment & Plan Note (Signed)
Stable. Discussed with him our clinic likely cannot continue managing this issue for him if we are not still his PCP as we are not a pain management clinic. He will speak with [facility] Friendly and hopefully get an orange card application (medicaid application seems to be on hold, not hopeful this will go through). Once he gets an orange card his medications could go through [facility] at health dept. Follow up in 2 months if stays [facility] patient, otherwise will need pain clinic referral from [facility] and wellness PCP for additional norco refills.

## 2015-06-12 NOTE — Telephone Encounter (Signed)
Pt is requesting a letter stating he's not able to work, due to his back, legs and heart condition.

## 2015-06-12 NOTE — Telephone Encounter (Signed)
Will forward to MD. Jazmin Hartsell,CMA  

## 2015-06-21 ENCOUNTER — Encounter: Payer: Self-pay | Admitting: Cardiology

## 2015-06-21 ENCOUNTER — Ambulatory Visit (INDEPENDENT_AMBULATORY_CARE_PROVIDER_SITE_OTHER): Payer: Medicaid Other | Admitting: Cardiology

## 2015-06-21 VITALS — BP 122/92 | HR 72 | Ht 67.0 in | Wt 169.7 lb

## 2015-06-21 DIAGNOSIS — I10 Essential (primary) hypertension: Secondary | ICD-10-CM | POA: Diagnosis not present

## 2015-06-21 DIAGNOSIS — I25118 Atherosclerotic heart disease of native coronary artery with other forms of angina pectoris: Secondary | ICD-10-CM

## 2015-06-21 DIAGNOSIS — E785 Hyperlipidemia, unspecified: Secondary | ICD-10-CM | POA: Diagnosis not present

## 2015-06-21 NOTE — Patient Instructions (Signed)
Stop taking Brilinta  Continue ASA 81 mg daily.  Continue your other therapy
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
## 2015-06-26 ENCOUNTER — Ambulatory Visit (INDEPENDENT_AMBULATORY_CARE_PROVIDER_SITE_OTHER): Payer: Medicaid Other | Admitting: Family Medicine

## 2015-06-26 ENCOUNTER — Encounter: Payer: Self-pay | Admitting: Family Medicine

## 2015-06-26 VITALS — BP 130/87 | HR 76 | Temp 98.0°F | Wt 172.0 lb

## 2015-06-26 DIAGNOSIS — M170 Bilateral primary osteoarthritis of knee: Secondary | ICD-10-CM

## 2015-06-26 DIAGNOSIS — M546 Pain in thoracic spine: Secondary | ICD-10-CM | POA: Diagnosis not present

## 2015-06-26 DIAGNOSIS — M179 Osteoarthritis of knee, unspecified: Secondary | ICD-10-CM | POA: Diagnosis not present

## 2015-06-26 DIAGNOSIS — Z0271 Encounter for disability determination: Secondary | ICD-10-CM | POA: Diagnosis not present

## 2015-06-26 DIAGNOSIS — M1710 Unilateral primary osteoarthritis, unspecified knee: Secondary | ICD-10-CM

## 2015-06-26 DIAGNOSIS — IMO0002 Reserved for concepts with insufficient information to code with codable children: Secondary | ICD-10-CM
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
## 2015-06-29 NOTE — Telephone Encounter (Signed)
Spoke with patient about this on his last visit.

## 2015-07-24 ENCOUNTER — Telehealth: Payer: Self-pay | Admitting: Family Medicine

## 2015-07-24 DIAGNOSIS — M170 Bilateral primary osteoarthritis of knee: Secondary | ICD-10-CM

## 2015-07-24 NOTE — Telephone Encounter (Signed)
Patient says he was referred to a rheumatologist to have a form signed off stating how long he can sit or stand, they told patient they do not do disability cases. Patient needs to know what to do in order to have this taken care of.

## 2015-07-24 NOTE — Telephone Encounter (Signed)
Requesting refill on vicodin

## 2015-07-24 NOTE — Telephone Encounter (Signed)
Will combine with other message for this patient from today. Jazmin Hartsell,CMA

## 2015-07-24 NOTE — Telephone Encounter (Signed)
Will forward to MD.  Patient also wants a refill on his vicodin.  Jazmin Hartsell,CMA

## 2015-08-02 NOTE — Telephone Encounter (Signed)
Pt calling once more about this request; specifically about the medication that he would like refilled. Thank you, Fonda Kinder, ASA

## 2015-08-06 ENCOUNTER — Other Ambulatory Visit: Payer: Self-pay | Admitting: Family Medicine

## 2015-08-06 DIAGNOSIS — M170 Bilateral primary osteoarthritis of knee: Secondary | ICD-10-CM

## 2015-08-06 MED ORDER — ESOMEPRAZOLE MAGNESIUM 40 MG PO CPDR: 40.0000 mg | DELAYED_RELEASE_CAPSULE | Freq: Two times a day (BID) | ORAL | Status: DC

## 2015-08-06 MED ORDER — METOPROLOL TARTRATE 25 MG PO TABS: 25.0000 mg | ORAL_TABLET | Freq: Two times a day (BID) | ORAL | Status: DC

## 2015-08-06 MED ORDER — HYDROCODONE-ACETAMINOPHEN 5-325 MG PO TABS: 1.0000 | ORAL_TABLET | Freq: Three times a day (TID) | ORAL | Status: DC | PRN

## 2015-08-06 MED ORDER — ATORVASTATIN CALCIUM 80 MG PO TABS: 80.0000 mg | ORAL_TABLET | Freq: Every day | ORAL | Status: DC

## 2015-08-06 NOTE — Telephone Encounter (Signed)
Pt advised. Ascencion Coye, CMA. 

## 2015-08-06 NOTE — Telephone Encounter (Signed)
Pt called needs a refill on her Lipitor, Nexium, Metoprolol called in and his Vicodin left up front. jw

## 2015-08-09 NOTE — Telephone Encounter (Signed)
Returned call and spoke about the forms. Spoke with attending physician and the normal route for social security disability would be the PCP doesn't fill out evaluation forms, it would be a court appointed physician (non-biased) that would do the physical evaluation and PCP records may be requested. He has been having more back pain so wants to come in for a visit, which I scheduled on 08/14/15. I will again review the form he is asking about. -Dr. Lamar Benes

## 2015-08-14 ENCOUNTER — Ambulatory Visit: Payer: Self-pay | Admitting: Family Medicine

## 2015-08-20 ENCOUNTER — Ambulatory Visit (INDEPENDENT_AMBULATORY_CARE_PROVIDER_SITE_OTHER): Payer: Medicaid Other | Admitting: Family Medicine

## 2015-08-20 ENCOUNTER — Encounter: Payer: Self-pay | Admitting: Family Medicine

## 2015-08-20 VITALS — BP 117/76 | HR 83 | Temp 97.5°F | Wt 171.8 lb

## 2015-08-20 DIAGNOSIS — M546 Pain in thoracic spine: Secondary | ICD-10-CM

## 2015-08-20 DIAGNOSIS — Z23 Encounter for immunization: Secondary | ICD-10-CM | POA: Diagnosis present

## 2015-08-20 MED ORDER — TIZANIDINE HCL 4 MG PO CAPS: 4.0000 mg | ORAL_CAPSULE | Freq: Three times a day (TID) | ORAL | Status: DC | PRN

## 2015-08-20 NOTE — Patient Instructions (Signed)
Get in contact with whomever would have copies of your disability paperwork (lawyer, DSS). Use this paperwork and call the food stamps benefits.  For your back pain we will refer you to an orthopedist.  You can take the Zanaflex muscle relaxer every 8 hours as needed for spasms to try and help.

## 2015-08-23 NOTE — Assessment & Plan Note (Signed)
Worsened recently without specific cause. Pain is so severe that he ends up vomiting, which he did at last clinic visit when he first said this was starting to bother him again, and also again today. He is already on long-term norco therapy. Recommended that he be evaluated by orthopedic spine, may need MRI to evaluate if his injury has worsened.
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
## 2015-08-25 ENCOUNTER — Emergency Department (HOSPITAL_COMMUNITY): Payer: Medicaid Other

## 2015-08-25 ENCOUNTER — Observation Stay (HOSPITAL_COMMUNITY)
Admission: EM | Admit: 2015-08-25 | Discharge: 2015-08-26 | Disposition: A | Discharge status: Home / Self Care | Payer: Medicaid Other | Attending: Family Medicine | Admitting: Family Medicine

## 2015-08-25 ENCOUNTER — Encounter (HOSPITAL_COMMUNITY): Payer: Self-pay | Admitting: Emergency Medicine

## 2015-08-25 DIAGNOSIS — E785 Hyperlipidemia, unspecified: Secondary | ICD-10-CM | POA: Diagnosis not present

## 2015-08-25 DIAGNOSIS — E119 Type 2 diabetes mellitus without complications: Secondary | ICD-10-CM

## 2015-08-25 DIAGNOSIS — R079 Chest pain, unspecified: Secondary | ICD-10-CM

## 2015-08-25 DIAGNOSIS — E1169 Type 2 diabetes mellitus with other specified complication: Secondary | ICD-10-CM | POA: Diagnosis present

## 2015-08-25 DIAGNOSIS — Z8249 Family history of ischemic heart disease and other diseases of the circulatory system: Secondary | ICD-10-CM | POA: Diagnosis not present

## 2015-08-25 DIAGNOSIS — M170 Bilateral primary osteoarthritis of knee: Secondary | ICD-10-CM | POA: Insufficient documentation

## 2015-08-25 DIAGNOSIS — R0789 Other chest pain: Principal | ICD-10-CM | POA: Insufficient documentation

## 2015-08-25 DIAGNOSIS — M549 Dorsalgia, unspecified: Secondary | ICD-10-CM | POA: Insufficient documentation

## 2015-08-25 DIAGNOSIS — I10 Essential (primary) hypertension: Secondary | ICD-10-CM | POA: Insufficient documentation

## 2015-08-25 DIAGNOSIS — Z955 Presence of coronary angioplasty implant and graft: Secondary | ICD-10-CM | POA: Insufficient documentation

## 2015-08-25 DIAGNOSIS — I2510 Atherosclerotic heart disease of native coronary artery without angina pectoris: Secondary | ICD-10-CM | POA: Insufficient documentation

## 2015-08-25 DIAGNOSIS — Z7982 Long term (current) use of aspirin: Secondary | ICD-10-CM | POA: Insufficient documentation

## 2015-08-25 DIAGNOSIS — G8929 Other chronic pain: Secondary | ICD-10-CM | POA: Diagnosis not present

## 2015-08-25 DIAGNOSIS — F329 Major depressive disorder, single episode, unspecified: Secondary | ICD-10-CM | POA: Diagnosis present

## 2015-08-25 HISTORY — DX: Type 2 diabetes mellitus without complications: E11.9

## 2015-08-25 LAB — BASIC METABOLIC PANEL
ANION GAP: 9 (ref 5–15)
BUN: 21 mg/dL — AB (ref 6–20)
CHLORIDE: 103 mmol/L (ref 101–111)
CO2: 27 mmol/L (ref 22–32)
Calcium: 9.4 mg/dL (ref 8.9–10.3)
Creatinine, Ser: 1.13 mg/dL (ref 0.61–1.24)
GFR calc Af Amer: >60 mL/min (ref >60)
GFR calc non Af Amer: >60 mL/min (ref >60)
GLUCOSE: 191 mg/dL — AB (ref 65–99)
POTASSIUM: 4.3 mmol/L (ref 3.5–5.1)
Sodium: 139 mmol/L (ref 135–145)

## 2015-08-25 LAB — CBC
HEMATOCRIT: 39.4 % (ref 39.0–52.0)
HEMOGLOBIN: 13.2 g/dL (ref 13.0–17.0)
MCH: 30.3 pg (ref 26.0–34.0)
MCHC: 33.5 g/dL (ref 30.0–36.0)
MCV: 90.4 fL (ref 78.0–100.0)
Platelets: 216 10*3/uL (ref 150–400)
RBC: 4.36 MIL/uL (ref 4.22–5.81)
RDW: 12.9 % (ref 11.5–15.5)
WBC: 10.4 10*3/uL (ref 4.0–10.5)

## 2015-08-25 LAB — I-STAT TROPONIN, ED
TROPONIN I, POC: 0 ng/mL (ref 0.00–0.08)
TROPONIN I, POC: 0 ng/mL (ref 0.00–0.08)

## 2015-08-25 MED ORDER — ACETAMINOPHEN 325 MG PO TABS: 650.0000 mg | ORAL_TABLET | ORAL | Status: DC | PRN

## 2015-08-25 MED ORDER — ONDANSETRON HCL 4 MG/2ML IJ SOLN: 4.0000 mg | Freq: Four times a day (QID) | INTRAMUSCULAR | Status: DC | PRN

## 2015-08-25 MED ORDER — MORPHINE SULFATE (PF) 2 MG/ML IV SOLN: 1.0000 mg | INTRAVENOUS | Status: DC | PRN

## 2015-08-25 MED ORDER — HEPARIN SODIUM (PORCINE) 5000 UNIT/ML IJ SOLN
5000.0000 [IU] | Freq: Three times a day (TID) | INTRAMUSCULAR | Status: DC
  Administered 2015-08-26 (×2): 5000 [IU] via SUBCUTANEOUS
  Filled 2015-08-25 (×2): qty 1

## 2015-08-25 MED ORDER — NITROGLYCERIN 2 % TD OINT
1.0000 [in_us] | TOPICAL_OINTMENT | Freq: Once | TRANSDERMAL | Status: AC
  Administered 2015-08-25: 1 [in_us] via TOPICAL
  Filled 2015-08-25: qty 1

## 2015-08-25 MED ORDER — ATORVASTATIN CALCIUM 80 MG PO TABS
80.0000 mg | ORAL_TABLET | Freq: Every day | ORAL | Status: DC
  Filled 2015-08-25: qty 1

## 2015-08-25 MED ORDER — SODIUM CHLORIDE 0.9 % IV BOLUS (SEPSIS)
500.0000 mL | Freq: Once | INTRAVENOUS | Status: AC
  Administered 2015-08-25: 500 mL via INTRAVENOUS

## 2015-08-25 MED ORDER — METOPROLOL TARTRATE 25 MG PO TABS
25.0000 mg | ORAL_TABLET | Freq: Two times a day (BID) | ORAL | Status: DC
  Administered 2015-08-26 (×2): 25 mg via ORAL
  Filled 2015-08-25 (×2): qty 1

## 2015-08-25 MED ORDER — HYDROCODONE-ACETAMINOPHEN 5-325 MG PO TABS
1.0000 | ORAL_TABLET | Freq: Three times a day (TID) | ORAL | Status: DC | PRN
  Administered 2015-08-26: 2 via ORAL
  Filled 2015-08-25: qty 2

## 2015-08-25 MED ORDER — MORPHINE SULFATE (PF) 2 MG/ML IV SOLN
2.0000 mg | Freq: Once | INTRAVENOUS | Status: AC
  Administered 2015-08-25: 2 mg via INTRAVENOUS
  Filled 2015-08-25: qty 1

## 2015-08-25 MED ORDER — ASPIRIN EC 81 MG PO TBEC
81.0000 mg | DELAYED_RELEASE_TABLET | Freq: Every day | ORAL | Status: DC
  Administered 2015-08-26: 81 mg via ORAL
  Filled 2015-08-25: qty 1

## 2015-08-25 MED ORDER — PANTOPRAZOLE SODIUM 40 MG PO TBEC
80.0000 mg | DELAYED_RELEASE_TABLET | Freq: Every day | ORAL | Status: DC
  Administered 2015-08-26: 80 mg via ORAL
  Filled 2015-08-25: qty 2

## 2015-08-25 NOTE — ED Provider Notes (Signed)
CSN: [id number]     Arrival date & time 08/25/15  1639 History   First MD Initiated Contact with Patient 08/25/15 2138     Chief Complaint  Patient presents with  . Chest Pain  . Numbness  . Emesis     (Consider location/radiation/quality/duration/timing/severity/associated sxs/prior Treatment) Patient is a 49 y.o. male presenting with chest pain and vomiting. The history is provided by the patient (The patient complains of chest pain off-and-on for the last couple days also with dyspnea on exertion. Patient has a history of stents).  Chest Pain Pain location:  Substernal area Pain quality: aching   Pain radiates to:  Does not radiate Pain severity:  Moderate Onset quality:  Sudden Timing:  Intermittent Progression:  Worsening Chronicity:  Recurrent Context: no drug use   Associated symptoms: vomiting   Associated symptoms: no abdominal pain, no back pain, no cough, no fatigue and no headache   Emesis Associated symptoms: no abdominal pain, no diarrhea and no headaches     Past Medical History  Diagnosis Date  . Barrett's esophagus     EGD - 11/27/09 - short segment of Barrett's  . Hyperlipidemia   . Anxiety   . Low back pain   . GERD (gastroesophageal reflux disease)   . Hiatal hernia     EGD - 11/27/2009  . Depression   . Adrenal tumor     a. s/p adrenal gland resection at [facility]. left side removal per patient  . Hypertension   . Sleep apnea   . Stone, kidney   . CAD (coronary artery disease)     a. 04/27/14 [place] s/p DES to mLAD and DES to mLCx  . Chronic chest pain   . Degenerative joint disease     Bilateral knees. Significant knee pain since playing football in high school. also in back per patient  . Chronic back pain     upper and lower per patient  . Primary hyperaldosteronism ([place]) 01/22/2012    S/P adrenalectomy      Past Surgical History  Procedure Laterality Date  . Knee arthroscopy Right X 6  . Lithotripsy  X 2  . Kidney stone surgery  X 1  .  Coronary angioplasty with stent placement  04/27/2014    3.0 x 16 mm Promus DES to the mid LAD and 3.5 x 28 mm Promus to the mid LCx, otherwise 20-30 percent lesions, EF 55%  . Knee arthroplasty Right 1984  . Adrenalectomy  10/2013  . Cardiac catheterization  05/01/2014    Patent stents, other disease unchanged  . Left heart catheterization with coronary angiogram N/A 04/27/2014    Procedure: LEFT HEART CATHETERIZATION WITH CORONARY ANGIOGRAM;  Surgeon: Peter M Martinique, MD;  Location: [facility] CATH LAB;  Service: Cardiovascular;  Laterality: N/A;  . Cardiac catheterization  04/27/2014    Procedure: CORONARY STENT INTERVENTION;  Surgeon: Peter M Martinique, MD;  Location: [facility] CATH LAB;  Service: Cardiovascular;;  DES mid Cx  DES mid LAD  . Left heart catheterization with coronary angiogram N/A 05/01/2014    Procedure: LEFT HEART CATHETERIZATION WITH CORONARY ANGIOGRAM;  Surgeon: Burnell Blanks, MD;  Location: [facility] CATH LAB;  Service: Cardiovascular;  Laterality: N/A;  . Cardiac catheterization N/A 03/22/2015    Procedure: Left Heart Cath and Coronary Angiography;  Surgeon: Sherren Mocha, MD;  Location: [place] CV LAB;  Service: Cardiovascular;  Laterality: N/A;   Family History  Problem Relation Age of Onset  . Cancer Father  stomach cancer  . Diabetes Father   . Heart attack Paternal Grandfather   . Diabetes Paternal Grandmother    Social History  Substance Use Topics  . Smoking status: Never Smoker   . Smokeless tobacco: Never Used  . Alcohol Use: Yes     Comment: "seldom" per patient    Review of Systems  Constitutional: Negative for appetite change and fatigue.  HENT: Negative for congestion, ear discharge and sinus pressure.   Eyes: Negative for discharge.  Respiratory: Negative for cough.   Cardiovascular: Positive for chest pain.  Gastrointestinal: Positive for vomiting. Negative for abdominal pain and diarrhea.  Genitourinary: Negative for frequency and hematuria.   Musculoskeletal: Negative for back pain.  Skin: Negative for rash.  Neurological: Negative for seizures and headaches.  Psychiatric/Behavioral: Negative for hallucinations.      Allergies  Cortisone acetate; Darvocet; Nsaids; and Xanax  Home Medications   Prior to Admission medications   Medication Sig Start Date End Date Taking? Authorizing Provider  aspirin EC 81 MG tablet Take 81 mg by mouth daily.   Yes Historical Provider, MD  atorvastatin (LIPITOR) 80 MG tablet Take 1 tablet (80 mg total) by mouth daily at 6 PM. 08/06/15  Yes Leone Brand, MD  esomeprazole (NEXIUM) 40 MG capsule Take 1 capsule (40 mg total) by mouth 2 (two) times daily before a meal. 08/06/15  Yes Leone Brand, MD  HYDROcodone-acetaminophen (NORCO/VICODIN) 5-325 MG tablet Take 1-2 tablets by mouth every 8 (eight) hours as needed for moderate pain. 08/06/15  Yes Leone Brand, MD  metoprolol tartrate (LOPRESSOR) 25 MG tablet Take 1 tablet (25 mg total) by mouth 2 (two) times daily. 08/06/15  Yes Leone Brand, MD   BP 122/78 mmHg  Pulse 76  Temp(Src) 97.8 F (36.6 C) (Oral)  Resp 17  Ht 5\' 7"  (1.702 m)  Wt 172 lb (78.019 kg)  BMI 26.93 kg/m2  SpO2 97% Physical Exam  Constitutional: He is oriented to person, place, and time. He appears well-developed.  HENT:  Head: Normocephalic.  Eyes: Conjunctivae and EOM are normal. No scleral icterus.  Neck: Neck supple. No thyromegaly present.  Cardiovascular: Normal rate and regular rhythm.  Exam reveals no gallop and no friction rub.   No murmur heard. Pulmonary/Chest: No stridor. He has no wheezes. He has no rales. He exhibits no tenderness.  Abdominal: He exhibits no distension. There is no tenderness. There is no rebound.  Musculoskeletal: Normal range of motion. He exhibits no edema.  Lymphadenopathy:    He has no cervical adenopathy.  Neurological: He is oriented to person, place, and time. He exhibits normal muscle tone. Coordination normal.  Skin: No  rash noted. No erythema.  Psychiatric: He has a normal mood and affect. His behavior is normal.    ED Course  Procedures (including critical care time) Labs Review Labs Reviewed  BASIC METABOLIC PANEL - Abnormal; Notable for the following:    Glucose, Bld 191 (*)    BUN 21 (*)    All other components within normal limits  CBC  I-STAT TROPOININ, ED  Randolm Idol, ED    Imaging Review Dg Chest 2 View  08/25/2015  CLINICAL DATA:  Tightness and soreness in the left forearm yesterday. Chest tightness and emesis today. History of coronary artery disease. EXAM: CHEST  2 VIEW COMPARISON:  03/21/2015. FINDINGS: The heart size and mediastinal contours are stable. Coronary artery stents are noted. The lungs are clear. There is no pleural effusion or pneumothorax.  No acute osseous findings are seen. There are mild degenerative changes throughout the spine. IMPRESSION: Stable chest.  No acute cardiopulmonary process. Electronically Signed   By: Richardean Sale M.D.   On: 08/25/2015 17:46   I have personally reviewed and evaluated these images and lab results as part of my medical decision-making.   EKG Interpretation   Date/Time:  Saturday August 25 2015 16:44:17 EST Ventricular Rate:  87 PR Interval:  160 QRS Duration: 86 QT Interval:  346 QTC Calculation: 416 R Axis:   66 Text Interpretation:  Normal sinus rhythm RSR' or QR pattern in V1  suggests right ventricular conduction delay Borderline ECG Confirmed by  Aryani Daffern  MD, Linus Weckerly [phone]) on 08/25/2015 9:26:40 PM      MDM   Final diagnoses:  Chest pain at rest    Chest pain on exertion. Will admit for chest pain workup    Milton Ferguson, MD 08/25/15 2216

## 2015-08-25 NOTE — ED Notes (Signed)
Attempted report 

## 2015-08-25 NOTE — ED Notes (Signed)
Pt c/o left arm tightness onset last night. Pt went to dinner today and began to experience left sided chest pain with shortness of breath, nausea and dry heaves.

## 2015-08-25 NOTE — H&P (Signed)
[facility] Admission History and Physical Service Pager: [phone]  Patient name: Andrew Huerta Medical record number: [id number] Date of birth: [birth year] Age: 49 y.o. Gender: male  Primary Care Provider: [name], MD Consultants: Cards Code Status: Full  Chief Complaint: Chest pain  Assessment and Plan: Andrew Huerta is a 49 y.o. male presenting with chest pain. PMH is significant for chronic chest pain, HLD, HTN, CAD s/p 2 stents, chronic back pain.   Chest Pain: Patient with history of CAD now s/p 2 stents. Has had multiple cardiac caths; most recent in July 2016 that was normal. Follows with Cardiology, Dr. Martinique, for chest pain. Recent Myoview in November was normal. Chest pain atypical for cardiac pain. HEART score 2. EKG with NSR and unchanged from priors. i-stat trop x2 negative. CXR unremarkable. Labs overall unremarkable. Vitals are stable.  -Admit to tele under Dr. Ree Kida -trend trops -repeat EKG AM -consider cardiology consult -vitals per floor protocol -zofran prn for nausea  -continue home ASA -prn morphine and nitro patch  HTN: Mildly elevated diastolic pressures in ED but otherwise normal. -continue to monitor -continue home metoprolol; has no other BP medications  HLD: Last lipid panel 09/2014 that was wnl. Total cholesterol 126. -continue home Lipitor 80  Chronic back pain: H/o compression fractures in thoracic spine. Pain can get so severe that he ends up vomiting. -continue home Vicodin prn q8hrs -being managed and followed as an outpatient  FEN/GI: Diet Heart; SLIV; Protonix Prophylaxis: Heb Subq  Disposition: Admit for observation for chest pain  History of Present Illness:  Andrew Huerta is a 49 y.o. male presenting with chest pain. Patient states that he has chronic chest pain but today felt different. He states that starting Friday he had some L. Forearm tightness/discomfort. His arm hurt even when moving it on  a pillow. Then today while out at a Christmas party have chest pain and L.arm pain together. He had associated nausea and vomiting with episode. He states his usual chest pain only last secs to mins but this episode has lasted all day and seems different. He had one episode of exertional dyspnea when pushing his grandchild around but otherwise just walking no dyspnea. Chest pain was 8/10 in severity. He follows as an outpatient with Cardiology. States he uis unable to take PO nitroglycerin due to soft BPs on medication and slurred speech,   Review Of Systems: Per HPI.  Otherwise the remainder of the systems were negative.  Patient Active Problem List   Diagnosis Date Noted  . Chronic back pain 04/27/2015  . Left forearm pain 04/27/2015  . Chest pain with moderate risk of acute coronary syndrome   . New onset seizure ([place]) 09/29/2014  . Neck pain 09/25/2014  . Encounter for chronic pain management 08/09/2014  . CAD- 2 V PCI Aug 2015   . Hyperlipidemia   . BPPV (benign paroxysmal positional vertigo) 12/11/2013  . Nephrolithiasis 10/12/2013  . Major depression ([place]) 07/26/2013  . Restless legs syndrome 01/05/2013  . PTSD (post-traumatic stress disorder) 05/07/2012  . Back pain, thoracic 04/14/2012  . Headache(784.0) 11/14/2011  . Atypical chest pain 08/14/2011  . Enlarged prostate 03/26/2011  . DJD-knees- needs TKR 01/08/2010  . Essential hypertension 05/02/2008  . Barrett's esophagus 10/29/2006    Past Medical History: Past Medical History  Diagnosis Date  . Barrett's esophagus     EGD - 11/27/09 - short segment of Barrett's  . Hyperlipidemia   . Anxiety   .  Low back pain   . GERD (gastroesophageal reflux disease)   . Hiatal hernia     EGD - 11/27/2009  . Depression   . Adrenal tumor     a. s/p adrenal gland resection at [facility]. left side removal per patient  . Hypertension   . Sleep apnea   . Stone, kidney   . CAD (coronary artery disease)     a. 04/27/14 [place] s/p DES to mLAD  and DES to mLCx  . Chronic chest pain   . Degenerative joint disease     Bilateral knees. Significant knee pain since playing football in high school. also in back per patient  . Chronic back pain     upper and lower per patient  . Primary hyperaldosteronism ([place]) 01/22/2012    S/P adrenalectomy       Past Surgical History: Past Surgical History  Procedure Laterality Date  . Knee arthroscopy Right X 6  . Lithotripsy  X 2  . Kidney stone surgery  X 1  . Coronary angioplasty with stent placement  04/27/2014    3.0 x 16 mm Promus DES to the mid LAD and 3.5 x 28 mm Promus to the mid LCx, otherwise 20-30 percent lesions, EF 55%  . Knee arthroplasty Right 1984  . Adrenalectomy  10/2013  . Cardiac catheterization  05/01/2014    Patent stents, other disease unchanged  . Left heart catheterization with coronary angiogram N/A 04/27/2014    Procedure: LEFT HEART CATHETERIZATION WITH CORONARY ANGIOGRAM;  Surgeon: Peter M Martinique, MD;  Location: [facility] CATH LAB;  Service: Cardiovascular;  Laterality: N/A;  . Cardiac catheterization  04/27/2014    Procedure: CORONARY STENT INTERVENTION;  Surgeon: Peter M Martinique, MD;  Location: [facility] CATH LAB;  Service: Cardiovascular;;  DES mid Cx  DES mid LAD  . Left heart catheterization with coronary angiogram N/A 05/01/2014    Procedure: LEFT HEART CATHETERIZATION WITH CORONARY ANGIOGRAM;  Surgeon: Burnell Blanks, MD;  Location: [facility] CATH LAB;  Service: Cardiovascular;  Laterality: N/A;  . Cardiac catheterization N/A 03/22/2015    Procedure: Left Heart Cath and Coronary Angiography;  Surgeon: Sherren Mocha, MD;  Location: [place] CV LAB;  Service: Cardiovascular;  Laterality: N/A;   Social History: Social History  Substance Use Topics  . Smoking status: Never Smoker   . Smokeless tobacco: Never Used  . Alcohol Use: Yes     Comment: "seldom" per patient   Additional social history: Lives with fiance and mom Please also refer to relevant sections of  EMR.  Family History: Family History  Problem Relation Age of Onset  . Cancer Father     stomach cancer  . Diabetes Father   . Heart attack Paternal Grandfather   . Diabetes Paternal Grandmother    Allergies and Medications: Allergies  Allergen Reactions  . Cortisone Acetate Swelling  . Darvocet [Propoxyphene N-Acetaminophen] Nausea And Vomiting  . Nsaids Nausea And Vomiting and Other (See Comments)    Caused stomach ulcers in the past   . Xanax [Alprazolam] Other (See Comments)    Pt states causes him to be mean, irritable   No current facility-administered medications on file prior to encounter.   Current Outpatient Prescriptions on File Prior to Encounter  Medication Sig Dispense Refill  . aspirin EC 81 MG tablet Take 81 mg by mouth daily.    Marland Kitchen atorvastatin (LIPITOR) 80 MG tablet Take 1 tablet (80 mg total) by mouth daily at 6 PM. 90 tablet 3  . esomeprazole (  NEXIUM) 40 MG capsule Take 1 capsule (40 mg total) by mouth 2 (two) times daily before a meal. 60 capsule 3  . HYDROcodone-acetaminophen (NORCO/VICODIN) 5-325 MG tablet Take 1-2 tablets by mouth every 8 (eight) hours as needed for moderate pain. 90 tablet 0  . metoprolol tartrate (LOPRESSOR) 25 MG tablet Take 1 tablet (25 mg total) by mouth 2 (two) times daily. 180 tablet 3    Objective: BP 117/80 mmHg  Pulse 77  Temp(Src) 97.8 F (36.6 C) (Oral)  Resp 16  Ht 5\' 7"  (1.702 m)  Wt 172 lb (78.019 kg)  BMI 26.93 kg/m2  SpO2 97% Exam: GENERAL: Well appearing WM in NAD. HEENT:NCAT, PERRL, EOMI, sclera are clear. Oropharynx is clear. MMM NECK:Supple, full ROM, No jugular venous distention,  no thyromegaly or adenopathy LUNGS: Clear to auscultation bilaterally, no wheezes or crackles, normal work of breathing CHEST: Unremarkable; non-tender HEART: RRR,S1 and S2 within normal limits, no clicks, no rubs, no murmurs ABD: Soft, nontender. BS+, no masses. No hepatomegaly, no splenomegaly EXT: 2 + pulses  throughout, no edema, no cyanosis no clubbing SKIN: Warm and dry.No rashes NEURO: Alert and oriented x 3. Cranial nerves II through XII intact. Gross motor and sensation intact. PSYCH:Mood and affect are normal; no evidence of anxiety or depression.  Labs and Imaging: Results for orders placed or performed during the hospital encounter of 08/25/15 (from the past 24 hour(s))  Basic metabolic panel     Status: Abnormal   Collection Time: 08/25/15  4:50 PM  Result Value Ref Range   Sodium 139 135 - 145 mmol/L   Potassium 4.3 3.5 - 5.1 mmol/L   Chloride 103 101 - 111 mmol/L   CO2 27 22 - 32 mmol/L   Glucose, Bld 191 (H) 65 - 99 mg/dL   BUN 21 (H) 6 - 20 mg/dL   Creatinine, Ser 1.13 0.61 - 1.24 mg/dL   Calcium 9.4 8.9 - 10.3 mg/dL   GFR calc non Af Amer >60 >60 mL/min   GFR calc Af Amer >60 >60 mL/min   Anion gap 9 5 - 15  CBC     Status: None   Collection Time: 08/25/15  4:50 PM  Result Value Ref Range   WBC 10.4 4.0 - 10.5 K/uL   RBC 4.36 4.22 - 5.81 MIL/uL   Hemoglobin 13.2 13.0 - 17.0 g/dL   HCT 39.4 39.0 - 52.0 %   MCV 90.4 78.0 - 100.0 fL   MCH 30.3 26.0 - 34.0 pg   MCHC 33.5 30.0 - 36.0 g/dL   RDW 12.9 11.5 - 15.5 %   Platelets 216 150 - 400 K/uL  I-stat troponin, ED (not at [facility])     Status: None   Collection Time: 08/25/15  5:07 PM  Result Value Ref Range   Troponin i, poc 0.00 0.00 - 0.08 ng/mL   Comment 3          I-stat troponin, ED     Status: None   Collection Time: 08/25/15  9:59 PM  Result Value Ref Range   Troponin i, poc 0.00 0.00 - 0.08 ng/mL   Comment 3            Dg Chest 2 View  08/25/2015  CLINICAL DATA:  Tightness and soreness in the left forearm yesterday. Chest tightness and emesis today. History of coronary artery disease. EXAM: CHEST  2 VIEW COMPARISON:  03/21/2015. FINDINGS: The heart size and mediastinal contours are stable. Coronary artery stents are noted.  The lungs are clear. There is no pleural effusion or pneumothorax. No acute  osseous findings are seen. There are mild degenerative changes throughout the spine. IMPRESSION: Stable chest.  No acute cardiopulmonary process. Electronically Signed   By: Richardean Sale M.D.   On: 08/25/2015 17:46   Katheren Shams, DO 08/25/2015, 10:01 PM PGY-2, [place] [facility] pager: [phone], text pages welcome

## 2015-08-26 DIAGNOSIS — R079 Chest pain, unspecified: Secondary | ICD-10-CM | POA: Diagnosis present

## 2015-08-26 DIAGNOSIS — E785 Hyperlipidemia, unspecified: Secondary | ICD-10-CM | POA: Diagnosis not present

## 2015-08-26 DIAGNOSIS — I25118 Atherosclerotic heart disease of native coronary artery with other forms of angina pectoris: Secondary | ICD-10-CM

## 2015-08-26 DIAGNOSIS — F339 Major depressive disorder, recurrent, unspecified: Secondary | ICD-10-CM

## 2015-08-26 DIAGNOSIS — I10 Essential (primary) hypertension: Secondary | ICD-10-CM

## 2015-08-26 DIAGNOSIS — R0789 Other chest pain: Secondary | ICD-10-CM | POA: Diagnosis not present

## 2015-08-26 DIAGNOSIS — R072 Precordial pain: Secondary | ICD-10-CM | POA: Diagnosis not present

## 2015-08-26 DIAGNOSIS — I2510 Atherosclerotic heart disease of native coronary artery without angina pectoris: Secondary | ICD-10-CM | POA: Diagnosis not present

## 2015-08-26 LAB — TROPONIN I: Troponin I: <0.03 ng/mL (ref <0.031)

## 2015-08-26 LAB — CBC
HCT: 35.4 % — ABNORMAL LOW (ref 39.0–52.0)
Hemoglobin: 11.6 g/dL — ABNORMAL LOW (ref 13.0–17.0)
MCH: 29.6 pg (ref 26.0–34.0)
MCHC: 32.8 g/dL (ref 30.0–36.0)
MCV: 90.3 fL (ref 78.0–100.0)
PLATELETS: 182 10*3/uL (ref 150–400)
RBC: 3.92 MIL/uL — ABNORMAL LOW (ref 4.22–5.81)
RDW: 13.1 % (ref 11.5–15.5)
WBC: 8.8 10*3/uL (ref 4.0–10.5)

## 2015-08-26 LAB — CREATININE, SERUM: Creatinine, Ser: 0.91 mg/dL (ref 0.61–1.24)

## 2015-08-26 LAB — GLUCOSE, CAPILLARY: Glucose-Capillary: 232 mg/dL — ABNORMAL HIGH (ref 65–99)

## 2015-08-26 NOTE — Discharge Instructions (Signed)
You were admitted for chest pain that resolved. You should call your cardiologist to see if he would like to see you for hospital follow-up.  An appointment with your primary care doctor, Dr. Dema Severin has been made to follow-up from this hospitalization. You should continue taking her heart medications as prescribed.    Chest Pain Observation It is often hard to give a specific diagnosis for the cause of chest pain. Among other possibilities your symptoms might be caused by inadequate oxygen delivery to your heart (angina). Angina that is not treated or evaluated can lead to a heart attack (myocardial infarction) or death. Blood tests, electrocardiograms, and X-rays may have been done to help determine a possible cause of your chest pain. After evaluation and observation, your health care provider has determined that it is unlikely your pain was caused by an unstable condition that requires hospitalization. However, a full evaluation of your pain may need to be completed, with additional diagnostic testing as directed. It is very important to keep your follow-up appointments. Not keeping your follow-up appointments could result in permanent heart damage, disability, or death. If there is any problem keeping your follow-up appointments, you must call your health care provider. HOME CARE INSTRUCTIONS  Due to the slight chance that your pain could be angina, it is important to follow your health care provider's treatment plan and also maintain a healthy lifestyle:  Maintain or work toward achieving a healthy weight.  Stay physically active and exercise regularly.  Decrease your salt intake.  Eat a balanced, healthy diet. Talk to a dietitian to learn about heart-healthy foods.  Increase your fiber intake by including whole grains, vegetables, fruits, and nuts in your diet.  Avoid situations that cause stress, anger, or depression.  Take medicines as advised by your health care provider. Report any side  effects to your health care provider. Do not stop medicines or adjust the dosages on your own.  Quit smoking. Do not use nicotine patches or gum until you check with your health care provider.  Keep your blood pressure, blood sugar, and cholesterol levels within normal limits.  Limit alcohol intake to no more than 1 drink per day for women who are not pregnant and 2 drinks per day for men.  Do not abuse drugs. SEEK IMMEDIATE MEDICAL CARE IF: You have severe chest pain or pressure which may include symptoms such as:  You feel pain or pressure in your arms, neck, jaw, or back.  You have severe back or abdominal pain, feel sick to your stomach (nauseous), or throw up (vomit).  You are sweating profusely.  You are having a fast or irregular heartbeat.  You feel short of breath while at rest.  You notice increasing shortness of breath during rest, sleep, or with activity.  You have chest pain that does not get better after rest or after taking your usual medicine.  You wake from sleep with chest pain.  You are unable to sleep because you cannot breathe.  You develop a frequent cough or you are coughing up blood.  You feel dizzy, faint, or experience extreme fatigue.  You develop severe weakness, dizziness, fainting, or chills. Any of these symptoms may represent a serious problem that is an emergency. Do not wait to see if the symptoms will go away. Call your local emergency services (911 in the [place] Do not drive yourself to the hospital. MAKE SURE YOU:  Understand these instructions.  Will watch your condition.  Will get help right  away if you are not doing well or get worse.   This information is not intended to replace advice given to you by your health care provider. Make sure you discuss any questions you have with your health care provider.   Document Released: 09/20/2010 Document Revised: 08/23/2013 Document Reviewed: 02/17/2013 Elsevier Interactive Patient  Education [employer].
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
## 2015-08-26 NOTE — Discharge Summary (Signed)
[facility] Discharge Summary  Patient name: Andrew Huerta Medical record number: [id number] Date of birth: 05/10/1966 Age: 50 y.o. Gender: male Date of Admission: 08/25/2015  Date of Discharge: 08/26/15 Admitting Physician: Lupita Dawn, MD  Primary Care Provider: [name], MD Consultants: Cardiology  Indication for Hospitalization: Chest Pain  Discharge Diagnoses/Problem List:  Patient Active Problem List   Diagnosis Date Noted  . Chest pain 08/25/2015  . Chronic back pain 04/27/2015  . Left forearm pain 04/27/2015  . Chest pain with moderate risk of acute coronary syndrome   . New onset seizure ([place]) 09/29/2014  . Neck pain 09/25/2014  . Encounter for chronic pain management 08/09/2014  . CAD- 2 V PCI Aug 2015   . Hyperlipidemia   . BPPV (benign paroxysmal positional vertigo) 12/11/2013  . Nephrolithiasis 10/12/2013  . Major depression ([place]) 07/26/2013  . Restless legs syndrome 01/05/2013  . PTSD (post-traumatic stress disorder) 05/07/2012  . Back pain, thoracic 04/14/2012  . Headache(784.0) 11/14/2011  . Atypical chest pain 08/14/2011  . Enlarged prostate 03/26/2011  . DJD-knees- needs TKR 01/08/2010  . Essential hypertension 05/02/2008  . Barrett's esophagus 10/29/2006   Disposition: home   Discharge Condition: stable  Discharge Exam:  Filed Vitals:   08/26/15 0000 08/26/15 0117 08/26/15 1056  BP: 120/76 120/76 130/80  Pulse: 72 72 64  Temp: 97.6 F (36.4 C)    TempSrc: Oral    Resp: 16    Height: 5\' 7"  (1.702 m)    Weight: 175 lb 3.2 oz (79.47 kg)    SpO2: 96%    General: Well developed/well nourished in NAD Neck supple CV - RRR/normal S1 and S2; no murmurs, rubs or gallops Abdomen -NT/ND, no HSM, no mass, + bowel sounds, no bruit Ext-no edema Neuro-grossly nonfocal  Brief Hospital Course:  Mr Hellyer was hospitalized for chest pain.  His troponins were cycled and were negative.  His EKG did not show any acute  changes.  Cardiology was consulted and did not feel like additional inpatient evaluation was needed; cardiac cath was negative 03/2015; Myoview Neg 07/2015.  Recommended he contact his cardiologist to see if additional evaluation is needed.   Issues for Follow Up:  1. Cardiology f/u as needed 2. A1c test results 3. Pain Medication Diversion? - Not getting pain medications filled in [place] per database? Is he getting these by mail order or across state lines? Consider UDS  Significant Procedures: None  Significant Labs and Imaging:   Recent Labs Lab 08/25/15 1650 08/26/15 0045  WBC 10.4 8.8  HGB 13.2 11.6*  HCT 39.4 35.4*  PLT 216 182    Recent Labs Lab 08/25/15 1650 08/26/15 0045  NA 139  --   K 4.3  --   CL 103  --   CO2 27  --   GLUCOSE 191*  --   BUN 21*  --   CREATININE 1.13 0.91  CALCIUM 9.4  --      Recent Labs Lab 08/26/15 0045 08/26/15 0228 08/26/15 0548  TROPONINI <0.03 <0.03 <0.03   Results/Tests Pending at Time of Discharge: A1c  Discharge Medications:    Medication List    TAKE these medications        aspirin EC 81 MG tablet  Take 81 mg by mouth daily.     atorvastatin 80 MG tablet  Commonly known as:  LIPITOR  Take 1 tablet (80 mg total) by mouth daily at 6 PM.     esomeprazole 40  MG capsule  Commonly known as:  NEXIUM  Take 1 capsule (40 mg total) by mouth 2 (two) times daily before a meal.     HYDROcodone-acetaminophen 5-325 MG tablet  Commonly known as:  NORCO/VICODIN  Take 1-2 tablets by mouth every 8 (eight) hours as needed for moderate pain.     metoprolol tartrate 25 MG tablet  Commonly known as:  LOPRESSOR  Take 1 tablet (25 mg total) by mouth 2 (two) times daily.        Discharge Instructions: Please refer to Patient Instructions section of EMR for full details.  Patient was counseled important signs and symptoms that should prompt return to medical care, changes in medications, dietary instructions, activity restrictions, and  follow up appointments.   Follow-Up Appointments:     Follow-up Information    Follow up with [name], MD On 08/04/2015.   Specialty:  Family Medicine   Why:  Apt @ 8:45 for hospital follow-up; A1c test results   Contact information:   [place] [address] [phone]       Olam Idler, MD 08/26/2015, 12:59 PM PGY-3, [place]

## 2015-08-26 NOTE — Progress Notes (Signed)
Shawn RN gave a verbal report using SBAR format, reviewed history, meds, VS, labs, Tests and patient's general condition, patient was brought up via bed into 3W-21 in stable condition, assessment done at this time and wil assume care of patient.

## 2015-08-26 NOTE — Progress Notes (Signed)
Report given to Charlett Nose RN in patient's room using SBAR format, reviewed chart, orders, labs, VS, meds and patient's general condition, care transferred to her in stable condition.

## 2015-08-28 LAB — HEMOGLOBIN A1C
HEMOGLOBIN A1C: 7.6 % — AB (ref 4.8–5.6)
Mean Plasma Glucose: 171 mg/dL

## 2015-08-29 ENCOUNTER — Encounter (HOSPITAL_COMMUNITY): Payer: Self-pay | Admitting: Family Medicine

## 2015-08-29 DIAGNOSIS — E119 Type 2 diabetes mellitus without complications: Secondary | ICD-10-CM

## 2015-08-29 HISTORY — DX: Type 2 diabetes mellitus without complications: E11.9

## 2015-09-04 ENCOUNTER — Ambulatory Visit (INDEPENDENT_AMBULATORY_CARE_PROVIDER_SITE_OTHER): Payer: Medicaid Other | Admitting: Family Medicine

## 2015-09-04 ENCOUNTER — Encounter: Payer: Self-pay | Admitting: Family Medicine

## 2015-09-04 VITALS — BP 116/81 | HR 77 | Temp 97.8°F | Wt 170.0 lb

## 2015-09-04 DIAGNOSIS — Z7189 Other specified counseling: Secondary | ICD-10-CM

## 2015-09-04 DIAGNOSIS — G8929 Other chronic pain: Secondary | ICD-10-CM

## 2015-09-04 DIAGNOSIS — E119 Type 2 diabetes mellitus without complications: Secondary | ICD-10-CM | POA: Diagnosis present

## 2015-09-04 DIAGNOSIS — M170 Bilateral primary osteoarthritis of knee: Secondary | ICD-10-CM

## 2015-09-04 DIAGNOSIS — R079 Chest pain, unspecified: Secondary | ICD-10-CM | POA: Diagnosis not present

## 2015-09-04 DIAGNOSIS — Z23 Encounter for immunization: Secondary | ICD-10-CM | POA: Diagnosis not present

## 2015-09-04 MED ORDER — GLUCOSE BLOOD VI STRP: ORAL_STRIP | Status: DC

## 2015-09-04 MED ORDER — ACCU-CHEK NANO SMARTVIEW W/DEVICE KIT: PACK | Status: DC

## 2015-09-04 MED ORDER — HYDROCODONE-ACETAMINOPHEN 5-325 MG PO TABS: 1.0000 | ORAL_TABLET | Freq: Three times a day (TID) | ORAL | Status: DC | PRN

## 2015-09-04 MED ORDER — METFORMIN HCL 500 MG PO TABS: 500.0000 mg | ORAL_TABLET | Freq: Two times a day (BID) | ORAL | Status: DC

## 2015-09-04 MED ORDER — ACCU-CHEK FASTCLIX LANCETS MISC: Status: DC

## 2015-09-04 NOTE — Patient Instructions (Signed)
  Diet Recommendations for Diabetes   Starchy (carb) foods include: Bread, rice, pasta, potatoes, corn, crackers, bagels, muffins, all baked goods.  (Fruits, milk, and yogurt also have carbohydrate, but most of these foods will not spike your blood sugar as the starchy foods will.)  A few fruits do cause high blood sugars; use small portions of bananas (limit to 1/2 at a time), grapes, and tropical fruits.    Protein foods include: Meat, fish, poultry, eggs, dairy foods, and beans such as pinto and kidney beans (beans also provide carbohydrate).   1. Eat at least 3 meals and 1-2 snacks per day. Never go more than 4-5 hours while awake without eating.  2. Limit starchy foods to TWO per meal and ONE per snack. ONE portion of a starchy  food is equal to the following:   - ONE slice of bread (or its equivalent, such as half of a hamburger bun).   - 1/2 cup of a "scoopable" starchy food such as potatoes or rice.   - 15 grams of carbohydrate as shown on food label.  3. Both lunch and dinner should include a protein food, a carb food, and vegetables.   - Obtain twice as many veg's as protein or carbohydrate foods for both lunch and dinner.   - Fresh or frozen veg's are best.   - Try to keep frozen veg's on hand for a quick vegetable serving.    4. Breakfast should always include protein.

## 2015-09-04 NOTE — Assessment & Plan Note (Signed)
Likely not cardiac etiology, however does have extensive cardiac history with stents in 2015. Will continue to monitor clinically. If persists or worsens will again refer back to cardiology.

## 2015-09-04 NOTE — Assessment & Plan Note (Signed)
Slightly worsened. Requested increase to 7.5mg  tab as he was taking 1.5 tabs, discussed this would not increase tylenol component. Will increase #15 tabs a month for additional breakthrough pain near nighttime but discuss again at subsequent visits. Will need UDS at next visit.

## 2015-09-04 NOTE — Assessment & Plan Note (Signed)
Discussed new diagnosis, initial dietary recommendations with printed DM Diet on AVS, discussed pneumovax which was given today. Start metformin once daily, titrate up. CBG meter sent in, check 1x daily for baseline. Also referred to optometry for initial DM retina exam. Will need to collect urine for microalbumin at next visit. Follow up 3 months.

## 2015-09-04 NOTE — Progress Notes (Signed)
   Subjective:    Patient ID: Andrew Huerta, male    DOB: 1966/08/27, 50 y.o.   MRN: [id number]  HPI  CC: hospital follow up   # Chest pain:  Admitted for chest pain end of December, troponins negative and cardiology did not see a need for repeat testing  Chest pain still intermittently present. Not worse. ROS: no current CP, no SOB, +GERD/reflux  # Diabetes  New diagnosis, A1c 7.6 in hospital  Had "borderline" diabetes when he was a kid but had not heard anything else about it recently  Diet: no sodas but eats a lot of candy and donuts (says sometimes a dozen donuts at a time)  Exercise: limited by pain, disability ROS: no polyuria, polydipsia, occasionally has blurred vision  # Chronic pain secondary to OA in knees, back  Slightly worsened  Has been doing 1.5 tabs norco 5-325 twice a day. Feels like he needs an additional tab to help closer to night time/while asleep  Social Hx: never smoker  Review of Systems   See HPI for ROS.   Past medical history, surgical, family, and social history reviewed and updated in the EMR as appropriate. Objective:  BP 116/81 mmHg  Pulse 77  Temp(Src) 97.8 F (36.6 C) (Oral)  Wt 170 lb (77.111 kg) Vitals and nursing note reviewed  General: NAD CV: no LE edema MSK: visual foot inspection -- has onychomycotic appearing toenails bilaterally, all digits. Sensory and monofilament testing normal of both feet.  Assessment & Plan:  Chest pain Likely not cardiac etiology, however does have extensive cardiac history with stents in 2015. Will continue to monitor clinically. If persists or worsens will again refer back to cardiology.  Diabetes mellitus type 2, controlled ([name]) Discussed new diagnosis, initial dietary recommendations with printed DM Diet on AVS, discussed pneumovax which was given today. Start metformin once daily, titrate up. CBG meter sent in, check 1x daily for baseline. Also referred to optometry for initial DM retina  exam. Will need to collect urine for microalbumin at next visit. Follow up 3 months.  Encounter for chronic pain management Slightly worsened. Requested increase to 7.5mg  tab as he was taking 1.5 tabs, discussed this would not increase tylenol component. Will increase #15 tabs a month for additional breakthrough pain near nighttime but discuss again at subsequent visits. Will need UDS at next visit.

## 2015-10-01 ENCOUNTER — Other Ambulatory Visit: Payer: Self-pay | Admitting: Family Medicine

## 2015-10-01 DIAGNOSIS — E119 Type 2 diabetes mellitus without complications: Secondary | ICD-10-CM

## 2015-10-01 DIAGNOSIS — M170 Bilateral primary osteoarthritis of knee: Secondary | ICD-10-CM

## 2015-10-01 MED ORDER — GLUCOSE BLOOD VI STRP
ORAL_STRIP | Status: DC
Start: 2015-10-01 — End: 2015-10-09

## 2015-10-01 MED ORDER — HYDROCODONE-ACETAMINOPHEN 5-325 MG PO TABS: 1.0000 | ORAL_TABLET | Freq: Three times a day (TID) | ORAL | Status: DC | PRN

## 2015-10-01 NOTE — Telephone Encounter (Signed)
Pt called and needs his Vicodin left up front and also he needs test strips for his meter. jw

## 2015-10-01 NOTE — Telephone Encounter (Signed)
Called patient and informed him prescription was printed and will be left at front desk. Patient had no further questions. -Dr. Lamar Benes

## 2015-10-08 ENCOUNTER — Telehealth: Payer: Self-pay | Admitting: Family Medicine

## 2015-10-08 NOTE — Telephone Encounter (Signed)
Will forward to MD. Shene Maxfield,CMA  

## 2015-10-08 NOTE — Telephone Encounter (Signed)
Wrong test strips sent in. Pt uses [employer]. Pt has not check blood sugar since Friday. Please send in asap.

## 2015-10-09 MED ORDER — GLUCOSE BLOOD VI STRP: ORAL_STRIP | Status: DC

## 2015-10-09 NOTE — Telephone Encounter (Signed)
Will forward to MD. Kayliee Atienza,CMA  

## 2015-10-09 NOTE — Telephone Encounter (Signed)
Pt called back to see if the test strips had been called in. He needs the Avia Plus test strips jw.

## 2015-10-09 NOTE — Telephone Encounter (Signed)
Sent in new rx for aviva plus test strips.

## 2015-10-29 ENCOUNTER — Other Ambulatory Visit: Payer: Self-pay | Admitting: Family Medicine

## 2015-10-29 DIAGNOSIS — M170 Bilateral primary osteoarthritis of knee: Secondary | ICD-10-CM

## 2015-10-29 NOTE — Telephone Encounter (Signed)
Pt called and would like a prescription of his Vicodin left up front. Please call when ready. jw

## 2015-10-30 NOTE — Telephone Encounter (Signed)
Was given an MRI for back at [facility] and was told that he had a cyst on one of his kidneys.  Patient said he has a lot of pain on his left side and that's probably the side the cyst is on.  Need to speak with provider regarding this.

## 2015-10-30 NOTE — Telephone Encounter (Signed)
Will forward to MD. Jazmin Hartsell,CMA  

## 2015-10-31 ENCOUNTER — Other Ambulatory Visit: Payer: Self-pay | Admitting: Family Medicine

## 2015-10-31 DIAGNOSIS — M170 Bilateral primary osteoarthritis of knee: Secondary | ICD-10-CM

## 2015-10-31 MED ORDER — HYDROCODONE-ACETAMINOPHEN 5-325 MG PO TABS: 1.0000 | ORAL_TABLET | Freq: Three times a day (TID) | ORAL | Status: DC | PRN

## 2015-10-31 NOTE — Telephone Encounter (Signed)
Please call. Needs follow-up appointment to discuss pain. I have refilled Norco medications but needs to be seen prior to additional refills.

## 2015-11-01 NOTE — Telephone Encounter (Signed)
Patient is aware of this.  He made a follow up appt for 11-12-15 but would like to know if he can see pcp on 11/05/15 ([facility] only).  He only has his medicaid until the end of the month and would like to get any pending appts done before then.  Will forward to MD to see if he is ok with seeing patient on his [facility] schedule. Jazmin Hartsell,CMA

## 2015-11-12 ENCOUNTER — Encounter: Payer: Self-pay | Admitting: Family Medicine

## 2015-11-12 ENCOUNTER — Ambulatory Visit (INDEPENDENT_AMBULATORY_CARE_PROVIDER_SITE_OTHER): Payer: Medicaid Other | Admitting: Family Medicine

## 2015-11-12 VITALS — BP 116/77 | HR 84 | Temp 97.6°F | Ht 67.0 in | Wt 169.8 lb

## 2015-11-12 DIAGNOSIS — Q6100 Congenital renal cyst, unspecified: Secondary | ICD-10-CM

## 2015-11-12 DIAGNOSIS — G8929 Other chronic pain: Secondary | ICD-10-CM | POA: Diagnosis not present

## 2015-11-12 DIAGNOSIS — N281 Cyst of kidney, acquired: Secondary | ICD-10-CM | POA: Insufficient documentation

## 2015-11-12 DIAGNOSIS — M170 Bilateral primary osteoarthritis of knee: Secondary | ICD-10-CM

## 2015-11-12 DIAGNOSIS — M549 Dorsalgia, unspecified: Secondary | ICD-10-CM | POA: Diagnosis not present

## 2015-11-12 DIAGNOSIS — Z7189 Other specified counseling: Secondary | ICD-10-CM | POA: Diagnosis not present

## 2015-11-12 HISTORY — DX: Cyst of kidney, acquired: N28.1

## 2015-11-12 LAB — POCT UA - MICROSCOPIC ONLY

## 2015-11-12 LAB — POCT URINALYSIS DIPSTICK
Bilirubin, UA: NEGATIVE
Glucose, UA: NEGATIVE
LEUKOCYTES UA: NEGATIVE
NITRITE UA: NEGATIVE
PH UA: 6.5
PROTEIN UA: NEGATIVE
Spec Grav, UA: 1.015
UROBILINOGEN UA: 1

## 2015-11-12 MED ORDER — HYDROCODONE-ACETAMINOPHEN 5-325 MG PO TABS: 1.0000 | ORAL_TABLET | Freq: Three times a day (TID) | ORAL | Status: DC | PRN

## 2015-11-12 MED ORDER — TIZANIDINE HCL 2 MG PO CAPS: 2.0000 mg | ORAL_CAPSULE | Freq: Three times a day (TID) | ORAL | Status: DC | PRN

## 2015-11-12 NOTE — Assessment & Plan Note (Signed)
Request records from [place], [place] signed by patient today. Per patient he is not a surgical candidate.

## 2015-11-12 NOTE — Assessment & Plan Note (Signed)
Reports this was seen on recent MRI of spine. I do not have the results faxed to me, ROI signed to get these. He has no urinary signs or symptoms right now. UA done today with trace blood (rare RBCs on microscopy), trace ketones, no protein. Will await MRI results but discussed this may be just incidental benign finding.

## 2015-11-12 NOTE — Assessment & Plan Note (Signed)
Future order for UDS before the end of the month.

## 2015-11-12 NOTE — Progress Notes (Signed)
   Subjective:    Patient ID: Andrew Huerta, male    DOB: 11/21/1965, 50 y.o.   MRN: [id number]  HPI  CC:   # Back pain:  Seen at [facility], told "non-surgical"  "compression fractures" and "arthritis"  Had MRI done which found incidental right renal cyst -- reports no changes in urination, no hematuria  # Chronic pain secondary to back and knee OA  Taking 1 tablet to 1.5 tablets when hurting really badly.  ROS: no constipation  Found out his mom has stage 4 lung cancer -- had face swelling from blood vessel occlusion  Social Hx: never smoker, does use marijuana  Review of Systems   See HPI for ROS.   Past medical history, surgical, family, and social history reviewed and updated in the EMR as appropriate. Objective:  BP 116/77 mmHg  Pulse 84  Temp(Src) 97.6 F (36.4 C) (Oral)  Ht 5\' 7"  (1.702 m)  Wt 169 lb 12.8 oz (77.021 kg)  BMI 26.59 kg/m2 Vitals and nursing note reviewed  General: no apparent distress  CV: normal rate, regular rhythm, no murmurs, rubs or gallop.  Resp: clear to auscultation bilaterally, normal effort Back: kyphotic, very tender bilaterally paraspinal area  Assessment & Plan:  Chronic back pain Request records from [place], [place] signed by patient today. Per patient he is not a surgical candidate.  Encounter for chronic pain management Future order for UDS before the end of the month.  Renal cyst, right Reports this was seen on recent MRI of spine. I do not have the results faxed to me, ROI signed to get these. He has no urinary signs or symptoms right now. UA done today with trace blood (rare RBCs on microscopy), trace ketones, no protein. Will await MRI results but discussed this may be just incidental benign finding.

## 2015-11-29 ENCOUNTER — Other Ambulatory Visit: Payer: Medicaid Other

## 2015-11-29 DIAGNOSIS — G8929 Other chronic pain: Secondary | ICD-10-CM

## 2015-11-29 NOTE — Progress Notes (Signed)
UDS DONE TODAY Andrew Huerta

## 2015-11-30 LAB — DRUG SCR UR, PAIN MGMT, REFLEX CONF
Amphetamine Screen, Ur: NEGATIVE
Barbiturate Quant, Ur: NEGATIVE
Benzodiazepines.: NEGATIVE
Cocaine Metabolites: NEGATIVE
Creatinine,U: 138.41 mg/dL
MARIJUANA METABOLITE: NEGATIVE
Methadone: NEGATIVE
PHENCYCLIDINE (PCP): NEGATIVE
PROPOXYPHENE: NEGATIVE

## 2015-12-04 LAB — OPIATES/OPIOIDS (LC/MS-MS)
Codeine Urine: NEGATIVE ng/mL (ref <50)
Hydrocodone: 754 ng/mL — ABNORMAL HIGH (ref <50)
Hydromorphone: 192 ng/mL — ABNORMAL HIGH (ref <50)
MORPHINE: NEGATIVE ng/mL (ref <50)
NORHYDROCODONE, UR: 382 ng/mL — AB (ref <50)
Noroxycodone, Ur: NEGATIVE ng/mL (ref <50)
OXYCODONE, UR: NEGATIVE ng/mL (ref <50)
OXYMORPHONE, URINE: NEGATIVE ng/mL (ref <50)

## 2015-12-26 ENCOUNTER — Other Ambulatory Visit: Payer: Self-pay | Admitting: Family Medicine

## 2015-12-26 DIAGNOSIS — M170 Bilateral primary osteoarthritis of knee: Secondary | ICD-10-CM

## 2015-12-26 NOTE — Telephone Encounter (Signed)
Refill request for Hydrocodone.  

## 2015-12-27 MED ORDER — HYDROCODONE-ACETAMINOPHEN 5-325 MG PO TABS: 1.0000 | ORAL_TABLET | Freq: Three times a day (TID) | ORAL | Status: DC | PRN

## 2015-12-27 NOTE — Telephone Encounter (Signed)
Patient is aware. Jazmin Hartsell,CMA  

## 2016-01-17 ENCOUNTER — Ambulatory Visit: Payer: Medicaid Other

## 2016-01-25 ENCOUNTER — Other Ambulatory Visit: Payer: Self-pay | Admitting: *Deleted

## 2016-01-25 DIAGNOSIS — M170 Bilateral primary osteoarthritis of knee: Secondary | ICD-10-CM

## 2016-01-25 NOTE — Telephone Encounter (Signed)
Pt needs refill on his Vicodin. Alecia Doi Kennon Holter, CMA

## 2016-01-29 ENCOUNTER — Other Ambulatory Visit: Payer: Self-pay | Admitting: Family Medicine

## 2016-01-29 DIAGNOSIS — M170 Bilateral primary osteoarthritis of knee: Secondary | ICD-10-CM

## 2016-01-29 MED ORDER — HYDROCODONE-ACETAMINOPHEN 5-325 MG PO TABS: 1.0000 | ORAL_TABLET | Freq: Three times a day (TID) | ORAL | Status: DC | PRN

## 2016-01-29 NOTE — Telephone Encounter (Signed)
Pt wants MD to know that he is working on getting his orange card and mom was recently Dx with stage 4 lung cancer and this is why he has not made an appointment recently. Delonna Ney, Salome Spotted, CMA

## 2016-01-29 NOTE — Telephone Encounter (Signed)
Pt is aware that script is ready for pick up. Stephenson Cichy,CMA  

## 2016-01-29 NOTE — Addendum Note (Signed)
Addended by: Leone Brand on: 01/29/2016 01:34 PM   Modules accepted: Orders

## 2016-02-07 ENCOUNTER — Ambulatory Visit: Payer: Self-pay | Admitting: Cardiology

## 2016-02-25 ENCOUNTER — Other Ambulatory Visit: Payer: Self-pay | Admitting: *Deleted

## 2016-02-25 DIAGNOSIS — M170 Bilateral primary osteoarthritis of knee: Secondary | ICD-10-CM

## 2016-02-25 NOTE — Telephone Encounter (Signed)
Patient requesting refill on hydrocodone.

## 2016-02-29 MED ORDER — HYDROCODONE-ACETAMINOPHEN 5-325 MG PO TABS: 1.0000 | ORAL_TABLET | Freq: Three times a day (TID) | ORAL | Status: DC | PRN

## 2016-02-29 NOTE — Telephone Encounter (Signed)
Pt informed of rx ready for pick up, up front. Page, cma.

## 2016-03-25 ENCOUNTER — Other Ambulatory Visit: Payer: Self-pay | Admitting: *Deleted

## 2016-03-25 DIAGNOSIS — M170 Bilateral primary osteoarthritis of knee: Secondary | ICD-10-CM

## 2016-03-26 MED ORDER — HYDROCODONE-ACETAMINOPHEN 5-325 MG PO TABS: 1.0000 | ORAL_TABLET | Freq: Three times a day (TID) | ORAL | 0 refills | Status: DC | PRN

## 2016-03-26 NOTE — Telephone Encounter (Signed)
Called to discuss refill request  [place]: on average about 4 tabs a day.   I told patient that I will refill the amount he usually received from prior PCP. I have not met patient yet. He reports that he has tried to make an appointment in clinic for orange card visit but has not been able to get an appointment. He is agreeable for UDS at next visit.   Prescription printed and placed up front for patient to pick up.

## 2016-05-02 ENCOUNTER — Telehealth: Payer: Self-pay | Admitting: Internal Medicine

## 2016-05-02 DIAGNOSIS — M170 Bilateral primary osteoarthritis of knee: Secondary | ICD-10-CM

## 2016-05-02 MED ORDER — HYDROCODONE-ACETAMINOPHEN 5-325 MG PO TABS: 1.0000 | ORAL_TABLET | Freq: Three times a day (TID) | ORAL | 0 refills | Status: DC | PRN

## 2016-05-02 NOTE — Telephone Encounter (Signed)
Patient needing refills on Vicodan. Patient attempted to make appt with PCP but she does not have anything until 9/19. Should patient make appt with other Md or can RX be written for him to last until he can get in to see Gunadasa?

## 2016-05-02 NOTE — Telephone Encounter (Signed)
LMOVM that rx is up front for pick up. Requested patient call back and make an appt.

## 2016-05-02 NOTE — Telephone Encounter (Signed)
Printed Rx. Please let patient know that he can pick up at front desk. I refilled this medication last month. This is the last month I will refill without an appointment.

## 2016-05-13 ENCOUNTER — Encounter (HOSPITAL_COMMUNITY): Payer: Self-pay | Admitting: Emergency Medicine

## 2016-05-13 ENCOUNTER — Emergency Department (HOSPITAL_COMMUNITY)
Admission: EM | Admit: 2016-05-13 | Discharge: 2016-05-13 | Disposition: A | Discharge status: Home / Self Care | Payer: Self-pay | Attending: Emergency Medicine | Admitting: Emergency Medicine

## 2016-05-13 ENCOUNTER — Emergency Department (HOSPITAL_COMMUNITY): Payer: Self-pay

## 2016-05-13 DIAGNOSIS — Z96651 Presence of right artificial knee joint: Secondary | ICD-10-CM | POA: Insufficient documentation

## 2016-05-13 DIAGNOSIS — K2971 Gastritis, unspecified, with bleeding: Secondary | ICD-10-CM

## 2016-05-13 DIAGNOSIS — I10 Essential (primary) hypertension: Secondary | ICD-10-CM | POA: Insufficient documentation

## 2016-05-13 DIAGNOSIS — Z7982 Long term (current) use of aspirin: Secondary | ICD-10-CM | POA: Insufficient documentation

## 2016-05-13 DIAGNOSIS — E119 Type 2 diabetes mellitus without complications: Secondary | ICD-10-CM | POA: Insufficient documentation

## 2016-05-13 DIAGNOSIS — Z85858 Personal history of malignant neoplasm of other endocrine glands: Secondary | ICD-10-CM | POA: Insufficient documentation

## 2016-05-13 DIAGNOSIS — I2510 Atherosclerotic heart disease of native coronary artery without angina pectoris: Secondary | ICD-10-CM | POA: Insufficient documentation

## 2016-05-13 DIAGNOSIS — K922 Gastrointestinal hemorrhage, unspecified: Secondary | ICD-10-CM | POA: Insufficient documentation

## 2016-05-13 DIAGNOSIS — Z955 Presence of coronary angioplasty implant and graft: Secondary | ICD-10-CM | POA: Insufficient documentation

## 2016-05-13 DIAGNOSIS — R109 Unspecified abdominal pain: Secondary | ICD-10-CM | POA: Insufficient documentation

## 2016-05-13 LAB — COMPREHENSIVE METABOLIC PANEL
ALK PHOS: 53 U/L (ref 38–126)
ALT: 20 U/L (ref 17–63)
ANION GAP: 15 (ref 5–15)
AST: 26 U/L (ref 15–41)
Albumin: 4.8 g/dL (ref 3.5–5.0)
BILIRUBIN TOTAL: 1.1 mg/dL (ref 0.3–1.2)
BUN: 22 mg/dL — ABNORMAL HIGH (ref 6–20)
CALCIUM: 10.4 mg/dL — AB (ref 8.9–10.3)
CO2: 24 mmol/L (ref 22–32)
CREATININE: 1.02 mg/dL (ref 0.61–1.24)
Chloride: 100 mmol/L — ABNORMAL LOW (ref 101–111)
GFR calc non Af Amer: >60 mL/min (ref >60)
Glucose, Bld: 144 mg/dL — ABNORMAL HIGH (ref 65–99)
Potassium: 3.5 mmol/L (ref 3.5–5.1)
SODIUM: 139 mmol/L (ref 135–145)
TOTAL PROTEIN: 8.4 g/dL — AB (ref 6.5–8.1)

## 2016-05-13 LAB — URINE MICROSCOPIC-ADD ON

## 2016-05-13 LAB — URINALYSIS, ROUTINE W REFLEX MICROSCOPIC
GLUCOSE, UA: NEGATIVE mg/dL
Ketones, ur: NEGATIVE mg/dL
LEUKOCYTES UA: NEGATIVE
NITRITE: NEGATIVE
PH: 5.5 (ref 5.0–8.0)
PROTEIN: 100 mg/dL — AB
Specific Gravity, Urine: >1.03 — ABNORMAL HIGH (ref 1.005–1.030)

## 2016-05-13 LAB — CBC
HCT: 45.3 % (ref 39.0–52.0)
Hemoglobin: 15.8 g/dL (ref 13.0–17.0)
MCH: 31 pg (ref 26.0–34.0)
MCHC: 34.9 g/dL (ref 30.0–36.0)
MCV: 88.8 fL (ref 78.0–100.0)
PLATELETS: 252 10*3/uL (ref 150–400)
RBC: 5.1 MIL/uL (ref 4.22–5.81)
RDW: 13.3 % (ref 11.5–15.5)
WBC: 22.1 10*3/uL — ABNORMAL HIGH (ref 4.0–10.5)

## 2016-05-13 LAB — I-STAT TROPONIN, ED
TROPONIN I, POC: 0 ng/mL (ref 0.00–0.08)
TROPONIN I, POC: 0 ng/mL (ref 0.00–0.08)

## 2016-05-13 LAB — LIPASE, BLOOD: Lipase: 53 U/L — ABNORMAL HIGH (ref 11–51)

## 2016-05-13 LAB — OCCULT BLOOD GASTRIC / DUODENUM (SPECIMEN CUP): Occult Blood, Gastric: POSITIVE — AB

## 2016-05-13 MED ORDER — PANTOPRAZOLE SODIUM 40 MG PO TBEC
80.0000 mg | DELAYED_RELEASE_TABLET | Freq: Once | ORAL | Status: AC
  Administered 2016-05-13: 80 mg via ORAL
  Filled 2016-05-13: qty 2

## 2016-05-13 MED ORDER — OMEPRAZOLE 20 MG PO CPDR: 20.0000 mg | DELAYED_RELEASE_CAPSULE | Freq: Every day | ORAL | 0 refills | Status: DC

## 2016-05-13 MED ORDER — SUCRALFATE 1 G PO TABS: 1.0000 g | ORAL_TABLET | Freq: Three times a day (TID) | ORAL | 0 refills | Status: DC

## 2016-05-13 MED ORDER — FAMOTIDINE IN NACL 20-0.9 MG/50ML-% IV SOLN: 20.0000 mg | Freq: Once | INTRAVENOUS | Status: DC

## 2016-05-13 MED ORDER — PROMETHAZINE HCL 25 MG PO TABS: 25.0000 mg | ORAL_TABLET | Freq: Two times a day (BID) | ORAL | 0 refills | Status: DC

## 2016-05-13 MED ORDER — IOPAMIDOL (ISOVUE-370) INJECTION 76%
INTRAVENOUS | Status: AC
  Filled 2016-05-13: qty 100

## 2016-05-13 MED ORDER — SODIUM CHLORIDE 0.9 % IV BOLUS (SEPSIS)
1000.0000 mL | Freq: Once | INTRAVENOUS | Status: AC
  Administered 2016-05-13: 1000 mL via INTRAVENOUS

## 2016-05-13 MED ORDER — METOCLOPRAMIDE HCL 5 MG/ML IJ SOLN
10.0000 mg | Freq: Once | INTRAMUSCULAR | Status: AC
  Administered 2016-05-13: 10 mg via INTRAVENOUS
  Filled 2016-05-13: qty 2

## 2016-05-13 MED ORDER — ONDANSETRON HCL 4 MG/2ML IJ SOLN
4.0000 mg | Freq: Once | INTRAMUSCULAR | Status: AC
  Administered 2016-05-13: 4 mg via INTRAVENOUS
  Filled 2016-05-13: qty 2

## 2016-05-13 MED ORDER — PROMETHAZINE HCL 25 MG/ML IJ SOLN
25.0000 mg | Freq: Once | INTRAMUSCULAR | Status: AC
  Administered 2016-05-13: 25 mg via INTRAVENOUS
  Filled 2016-05-13: qty 1

## 2016-05-13 MED ORDER — IOPAMIDOL (ISOVUE-370) INJECTION 76%
100.0000 mL | Freq: Once | INTRAVENOUS | Status: AC | PRN
  Administered 2016-05-13: 100 mL via INTRAVENOUS

## 2016-05-13 NOTE — ED Provider Notes (Signed)
[facility] DEPT Provider Note   CSN: [id number] Arrival date & time: 05/13/16  1036     History   Chief Complaint Chief Complaint  Patient presents with  . Emesis  . Chest Pain    HPI Andrew Huerta is a 50 y.o. male who presents with N/V, chest pain, SOB, and hematemesis for the past day. PMH significant for PUD, GERD, Barrett's esophagus, chronic chest pain, CAD s/p stents, HTN, HLD, DM, pheochromocytoma s/p adrenalectomy, chronic back pain due to compression fractures and DDD, anxiety/depression. He has been off all of his medicines due to lack of insurance however he is able to afford his narcotics for chronic pain and has Phenergan. He states he acutely started having nausea and coffee ground emesis at [facility] Monday morning. He began to have chest tightness right after his initial episode of vomiting. It is on the left side, constant, non-radiating. It is atypical for his usual anginal symptoms which radiate to his L arm. He reports associated chills, SOB, and back pain. Denies fever, chills, leg swelling, cough, hemoptysis, lower abdominal pain, diarrhea/constipation, irritative voiding symptoms. Denies syncope, acute trauma, unexplained weight loss, hx of cancer, loss of bowel/bladder function, saddle anesthesia, urinary retention, IVDU. Also denies excessive alcohol or NSAID use.   HPI  Past Medical History:  Diagnosis Date  . Adrenal tumor    a. s/p adrenal gland resection at [facility]. left side removal per patient  . Anxiety   . Barrett's esophagus    EGD - 11/27/09 - short segment of Barrett's  . CAD (coronary artery disease)    a. 04/27/14 [place] s/p DES to mLAD and DES to mLCx  . Chronic back pain    upper and lower per patient  . Chronic chest pain   . Degenerative joint disease    Bilateral knees. Significant knee pain since playing football in high school. also in back per patient  . Depression   . Diabetes mellitus type 2, controlled ([place]) 08/29/2015   Diagnosed by A1c  7.6% during 08/26/15 hospital admission for chest pain  . GERD (gastroesophageal reflux disease)   . Hiatal hernia    EGD - 11/27/2009  . Hyperlipidemia   . Hypertension   . Low back pain   . Primary hyperaldosteronism ([place]) 01/22/2012   S/P adrenalectomy     . Sleep apnea   . Stone, kidney     Patient Active Problem List   Diagnosis Date Noted  . Renal cyst, right 11/12/2015  . Diabetes mellitus type 2, controlled ([place]) 08/29/2015  . Chest pain 08/25/2015  . Chronic back pain 04/27/2015  . Left forearm pain 04/27/2015  . Chest pain with moderate risk of acute coronary syndrome   . New onset seizure ([place]) 09/29/2014  . Neck pain 09/25/2014  . Encounter for chronic pain management 08/09/2014  . CAD- 2 V PCI Aug 2015   . Hyperlipidemia   . Nephrolithiasis 10/12/2013  . Primary hyperaldosteronism ([place]) 09/13/2013  . Major depression ([place]) 07/26/2013  . Restless legs syndrome 01/05/2013  . PTSD (post-traumatic stress disorder) 05/07/2012  . Back pain, thoracic 04/14/2012  . Compression fracture 02/05/2012  . Gastroesophageal reflux disease with hiatal hernia 02/05/2012  . Hypercholesterolemia 02/05/2012  . Herniation of nucleus pulposus 02/05/2012  . Apnea, sleep 02/05/2012  . Essential (primary) hypertension 02/05/2012  . Clinical depression 02/05/2012  . Headache(784.0) 11/14/2011  . Atypical chest pain 08/14/2011  . Enlarged prostate 03/26/2011  . DJD-knees- needs TKR 01/08/2010  . Essential hypertension 05/02/2008  .  Barrett's esophagus 10/29/2006    Past Surgical History:  Procedure Laterality Date  . ADRENALECTOMY  10/2013  . CARDIAC CATHETERIZATION  05/01/2014   Patent stents, other disease unchanged  . CARDIAC CATHETERIZATION  04/27/2014   Procedure: CORONARY STENT INTERVENTION;  Surgeon: Peter M Martinique, MD;  Location: [facility] CATH LAB;  Service: Cardiovascular;;  DES mid Cx  DES mid LAD  . CARDIAC CATHETERIZATION N/A 03/22/2015   Procedure: Left Heart Cath and  Coronary Angiography;  Surgeon: Sherren Mocha, MD;  Location: [facility] CV LAB;  Service: Cardiovascular;  Laterality: N/A;  . CORONARY ANGIOPLASTY WITH STENT PLACEMENT  04/27/2014   3.0 x 16 mm Promus DES to the mid LAD and 3.5 x 28 mm Promus to the mid LCx, otherwise 20-30 percent lesions, EF 55%  . KIDNEY STONE SURGERY  X 1  . KNEE ARTHROPLASTY Right 1984  . KNEE ARTHROSCOPY Right X 6  . LEFT HEART CATHETERIZATION WITH CORONARY ANGIOGRAM N/A 04/27/2014   Procedure: LEFT HEART CATHETERIZATION WITH CORONARY ANGIOGRAM;  Surgeon: Peter M Martinique, MD;  Location: [facility] CATH LAB;  Service: Cardiovascular;  Laterality: N/A;  . LEFT HEART CATHETERIZATION WITH CORONARY ANGIOGRAM N/A 05/01/2014   Procedure: LEFT HEART CATHETERIZATION WITH CORONARY ANGIOGRAM;  Surgeon: Burnell Blanks, MD;  Location: [facility] CATH LAB;  Service: Cardiovascular;  Laterality: N/A;  . LITHOTRIPSY  X 2       Home Medications    Prior to Admission medications   Medication Sig Start Date End Date Taking? Authorizing Provider  aspirin EC 81 MG tablet Take 81 mg by mouth daily.    Historical Provider, MD  HYDROcodone-acetaminophen (NORCO/VICODIN) 5-325 MG tablet Take 1-2 tablets by mouth every 8 (eight) hours as needed for moderate pain. 05/02/16   Smiley Houseman, MD    Family History Family History  Problem Relation Age of Onset  . Cancer Father     stomach cancer  . Diabetes Father   . Heart attack Paternal Grandfather   . Diabetes Paternal Grandmother     Social History Social History  Substance Use Topics  . Smoking status: Never Smoker  . Smokeless tobacco: Never Used  . Alcohol use 0.0 oz/week     Comment: "seldom" per patient     Allergies   Cortisone acetate; Darvocet [propoxyphene n-acetaminophen]; Nsaids; and Xanax [alprazolam]   Review of Systems Review of Systems  Constitutional: Negative for chills and fever.  Respiratory: Positive for shortness of breath.   Cardiovascular: Positive  for chest pain.  Gastrointestinal: Positive for abdominal pain, nausea and vomiting. Negative for blood in stool and constipation.  Genitourinary: Negative for dysuria, frequency, penile pain and urgency.  Musculoskeletal: Positive for back pain.  All other systems reviewed and are negative.    Physical Exam Updated Vital Signs BP (!) 172/106   Pulse 73   Temp 97.8 F (36.6 C) (Oral)   Resp 15   Wt 68 kg   SpO2 97%   BMI 23.49 kg/m   Physical Exam  Constitutional: He is oriented to person, place, and time. He appears well-developed and well-nourished. He appears distressed.  Actively vomiting in room  HENT:  Head: Normocephalic and atraumatic.  Eyes: Conjunctivae are normal. Pupils are equal, round, and reactive to light. Right eye exhibits no discharge. Left eye exhibits no discharge. No scleral icterus.  Neck: Normal range of motion. Neck supple.  Cardiovascular: Regular rhythm.  Tachycardia present.  Exam reveals no gallop and no friction rub.   No murmur heard. Pulmonary/Chest:  Effort normal and breath sounds normal. No respiratory distress. He has no wheezes. He has no rales. He exhibits no tenderness.  Abdominal: Soft. Bowel sounds are normal. He exhibits no distension and no mass. There is tenderness. There is no rebound and no guarding. No hernia.  Moderate diffuse tenderness  Musculoskeletal: He exhibits no edema.  Diffuse paraspinal muscle tenderness. No midline tenderness. No CVA tenderness  Neurological: He is alert and oriented to person, place, and time.  Skin: Skin is warm and dry. He is not diaphoretic.  Psychiatric: His speech is normal. His mood appears anxious.  Nursing note and vitals reviewed.    ED Treatments / Results  Labs (all labs ordered are listed, but only abnormal results are displayed) Labs Reviewed  CBC - Abnormal; Notable for the following:       Result Value   WBC 22.1 (*)    All other components within normal limits  LIPASE, BLOOD -  Abnormal; Notable for the following:    Lipase 53 (*)    All other components within normal limits  COMPREHENSIVE METABOLIC PANEL - Abnormal; Notable for the following:    Chloride 100 (*)    Glucose, Bld 144 (*)    BUN 22 (*)    Calcium 10.4 (*)    Total Protein 8.4 (*)    All other components within normal limits  URINALYSIS, ROUTINE W REFLEX MICROSCOPIC (NOT AT [facility]) - Abnormal; Notable for the following:    Color, Urine AMBER (*)    APPearance HAZY (*)    Specific Gravity, Urine >1.030 (*)    Hgb urine dipstick MODERATE (*)    Bilirubin Urine SMALL (*)    Protein, ur 100 (*)    All other components within normal limits  URINE MICROSCOPIC-ADD ON - Abnormal; Notable for the following:    Squamous Epithelial / LPF 0-5 (*)    Bacteria, UA RARE (*)    Casts HYALINE CASTS (*)    All other components within normal limits  OCCULT BLOOD GASTRIC / DUODENUM (SPECIMEN CUP) - Abnormal; Notable for the following:    Occult Blood, Gastric POSITIVE (*)    All other components within normal limits  I-STAT TROPOININ, ED  I-STAT TROPOININ, ED    EKG  EKG Interpretation None       Radiology Dg Chest 2 View  Result Date: 05/13/2016 CLINICAL DATA:  Chest pain in center of chest. Vomiting, shortness of breath. EXAM: CHEST  2 VIEW COMPARISON:  08/25/2015 FINDINGS: Heart and mediastinal contours are within normal limits. No focal opacities or effusions. No acute bony abnormality. Coronary artery calcifications noted. IMPRESSION: No active cardiopulmonary disease. Coronary artery disease. Electronically Signed   By: Rolm Baptise M.D.   On: 05/13/2016 12:22   Ct Abdomen Pelvis W Contrast  Result Date: 05/13/2016 CONTRAST:  100 mL Isovue 370 intravenous CLINICAL DATA:  Patient from home with complaints of emesis since yesterday. She reports generalized chest tightness and upper pain between the shoulder blades. Patient has had history of MI with stent placement. Report hiatal hernia. EXAM: CT  ANGIOGRAPHY CHEST CT ABDOMEN AND PELVIS WITH CONTRAST TECHNIQUE: Multidetector CT imaging of the chest was performed using the standard protocol during bolus administration of intravenous contrast. Multiplanar CT image reconstructions and MIPs were obtained to evaluate the vascular anatomy. Multidetector CT imaging of the abdomen and pelvis was performed using the standard protocol during bolus administration of intravenous contrast. COMPARISON:  01/12/2015, 01/09/2012, 12/05/2011 CT scan FINDINGS: CTA CHEST FINDINGS Mediastinum/Lymph  Nodes: Aorta demonstrates no evidence for aneurysm or dissection. Imaged pulmonary arteries demonstrate excellent opacification and are without filling defects. Heart size is nonenlarged. No significant pericardial effusion. Trachea and mainstem bronchi within normal limits. No significant axillary adenopathy. 1 cm right precarinal lymph node. There is diffuse wall thickening of the esophagus with right asymmetrical thickening of the distal esophagus. No gross mediastinal air identified. No definitive focal fluid collections within the mediastinum. Small amount of air present within the pulmonary artery trunk likely iatrogenic. Lungs/Pleura: Lung fields demonstrate no acute consolidation or pleural effusion. There is no pneumothorax identified. Musculoskeletal: No acute osseous abnormality. CT ABDOMEN and PELVIS FINDINGS Hepatobiliary: The liver demonstrates normal enhancement. No focal hepatic abnormalities are visualized. There is a small stone present within the gallbladder. No wall thickening or associated fluid. No biliary dilatation. Pancreas: Within normal limits Spleen: Within normal limits Adrenals/Urinary Tract: There is a 1.7 cm enhancing nodule within the right adrenal gland which has increased in size. There is asymmetric thickening of the lateral limb of the right adrenal gland. Bilateral intrarenal calculi are visualized, dominant stone on the left is seen at the mid to  lower pole and measures 8 mm. No hydronephrosis. 1.5 cm exophytic cyst arising from medial cortex of the mid right kidney. There is a focal area of hypo enhancement within the anteromedial cortex of the upper pole left kidney which may relate to an area of focal scarring. The urinary bladder is unremarkable. Stomach/Bowel: There is thickening of the distal esophagus. The imaged stomach is nondilated. There is no dilated small or large bowel. Appendix is visualized and is normal. Vascular/Lymphatic: Scattered atherosclerotic calcifications within the aorta. No significantly enlarged inguinal, retroperitoneal, or mesenteric lymph nodes. Reproductive: Coarse calcifications are present within the prostate gland. Other: No free air or free fluid. Musculoskeletal:   No acute osseous abnormality. Review of the MIP images confirms the above findings. IMPRESSION: 1. No CT evidence for acute aortic dissection. No evidence for aortic aneurysm. 2. Diffuse esophageal wall thickening with asymmetric wall thickening present involving the distal esophagus and GE junction. Findings could relate to esophagitis with esophageal neoplasm included in the differential. No gross mediastinal air or fluid collection, or pleural effusion to suggest perforation. 3. No CT evidence for acute intra-abdominal or pelvic pathology 4. Gallstone 5. 1.8 cm enhancing nodule in the right adrenal gland which has increased compared to the previous examination. This was previously thought to represent an adenoma ; given interval change in appearance, suggest further evaluation with non emergent MRI. 6. Bilateral kidney stones with no hydronephrosis. Subtle focal area of hypoenhancement within the anteromedial cortex of the upper pole of the left kidney, possibly related to scarring. 7. Coarse prostate gland calcification Electronically Signed   By: Donavan Foil M.D.   On: 05/13/2016 15:44   Ct Angio Chest Aorta W/cm &/or Wo/cm  Result Date:  05/13/2016 CONTRAST:  100 mL Isovue 370 intravenous CLINICAL DATA:  Patient from home with complaints of emesis since yesterday. She reports generalized chest tightness and upper pain between the shoulder blades. Patient has had history of MI with stent placement. Report hiatal hernia. EXAM: CT ANGIOGRAPHY CHEST CT ABDOMEN AND PELVIS WITH CONTRAST TECHNIQUE: Multidetector CT imaging of the chest was performed using the standard protocol during bolus administration of intravenous contrast. Multiplanar CT image reconstructions and MIPs were obtained to evaluate the vascular anatomy. Multidetector CT imaging of the abdomen and pelvis was performed using the standard protocol during bolus administration of intravenous contrast. COMPARISON:  01/12/2015, 01/09/2012, 12/05/2011 CT scan FINDINGS: CTA CHEST FINDINGS Mediastinum/Lymph Nodes: Aorta demonstrates no evidence for aneurysm or dissection. Imaged pulmonary arteries demonstrate excellent opacification and are without filling defects. Heart size is nonenlarged. No significant pericardial effusion. Trachea and mainstem bronchi within normal limits. No significant axillary adenopathy. 1 cm right precarinal lymph node. There is diffuse wall thickening of the esophagus with right asymmetrical thickening of the distal esophagus. No gross mediastinal air identified. No definitive focal fluid collections within the mediastinum. Small amount of air present within the pulmonary artery trunk likely iatrogenic. Lungs/Pleura: Lung fields demonstrate no acute consolidation or pleural effusion. There is no pneumothorax identified. Musculoskeletal: No acute osseous abnormality. CT ABDOMEN and PELVIS FINDINGS Hepatobiliary: The liver demonstrates normal enhancement. No focal hepatic abnormalities are visualized. There is a small stone present within the gallbladder. No wall thickening or associated fluid. No biliary dilatation. Pancreas: Within normal limits Spleen: Within normal  limits Adrenals/Urinary Tract: There is a 1.7 cm enhancing nodule within the right adrenal gland which has increased in size. There is asymmetric thickening of the lateral limb of the right adrenal gland. Bilateral intrarenal calculi are visualized, dominant stone on the left is seen at the mid to lower pole and measures 8 mm. No hydronephrosis. 1.5 cm exophytic cyst arising from medial cortex of the mid right kidney. There is a focal area of hypo enhancement within the anteromedial cortex of the upper pole left kidney which may relate to an area of focal scarring. The urinary bladder is unremarkable. Stomach/Bowel: There is thickening of the distal esophagus. The imaged stomach is nondilated. There is no dilated small or large bowel. Appendix is visualized and is normal. Vascular/Lymphatic: Scattered atherosclerotic calcifications within the aorta. No significantly enlarged inguinal, retroperitoneal, or mesenteric lymph nodes. Reproductive: Coarse calcifications are present within the prostate gland. Other: No free air or free fluid. Musculoskeletal:   No acute osseous abnormality. Review of the MIP images confirms the above findings. IMPRESSION: 1. No CT evidence for acute aortic dissection. No evidence for aortic aneurysm. 2. Diffuse esophageal wall thickening with asymmetric wall thickening present involving the distal esophagus and GE junction. Findings could relate to esophagitis with esophageal neoplasm included in the differential. No gross mediastinal air or fluid collection, or pleural effusion to suggest perforation. 3. No CT evidence for acute intra-abdominal or pelvic pathology 4. Gallstone 5. 1.8 cm enhancing nodule in the right adrenal gland which has increased compared to the previous examination. This was previously thought to represent an adenoma ; given interval change in appearance, suggest further evaluation with non emergent MRI. 6. Bilateral kidney stones with no hydronephrosis. Subtle focal  area of hypoenhancement within the anteromedial cortex of the upper pole of the left kidney, possibly related to scarring. 7. Coarse prostate gland calcification Electronically Signed   By: Donavan Foil M.D.   On: 05/13/2016 15:44    Procedures Procedures (including critical care time)  Medications Ordered in ED Medications  ondansetron (ZOFRAN) injection 4 mg (4 mg Intravenous Given 05/13/16 1157)  sodium chloride 0.9 % bolus 1,000 mL (0 mLs Intravenous Stopped 05/13/16 1359)  promethazine (PHENERGAN) injection 25 mg (25 mg Intravenous Given 05/13/16 1313)  sodium chloride 0.9 % bolus 1,000 mL (0 mLs Intravenous Stopped 05/13/16 1539)  iopamidol (ISOVUE-370) 76 % injection 100 mL (100 mLs Intravenous Contrast Given 05/13/16 1438)  pantoprazole (PROTONIX) EC tablet 80 mg (80 mg Oral Given 05/13/16 1718)  metoCLOPramide (REGLAN) injection 10 mg (10 mg Intravenous Given 05/13/16 1637)  Initial Impression / Assessment and Plan / ED Course  I have reviewed the triage vital signs and the nursing notes.  Pertinent labs & imaging results that were available during my care of the patient were reviewed by me and considered in my medical decision making (see chart for details).  Clinical Course  Value Comment By Time  EKG 12-Lead (Reviewed) Recardo Evangelist, PA-C 09/80 3166   50 year old male presents with multiple symptoms most consistent with acute upper GI bleed possibly from Mallory-Weiss tear vs recurrent PUD. EKG is sinus tachycardia without evidence of ischemia. CXR is negative. Initial Troponin is 0. 2nd troponin is 0. CBC remarkable for WBC count of 22.1. CMP remarkable for Cl of 100, BUN of 22, Ca of 10.4, Glucose 144, Lipase 53, total protein 8.4. UA remarkable for small bilirubin, rare bacteria, moderate hgb, 100 protein, high specific gravity, 2L IVF given.   CT of chest and abdomen is negative for acute aortic dissection. No evidence for aortic aneurysm. There is diffuse esophageal  wall thickening with asymmetric wall thickening present involving the distal esophagus and GE junction. Findings could relate to esophagitis with esophageal neoplasm included in the differential. No gross mediastinal air or fluid collection, or pleural effusion to suggest perforation.   On recheck, patient appears more comfortable. No further vomiting. Discussed results with patient and urged him to fill Omeprazole, Carafate, and phenergan rx and follow up with GI for outpatient endoscopy. Also discussed GERD diet and restrictions. Patient verbalized understanding however it is unclear how receptive he is to this and how adherent he will be. Patient is NAD, non-toxic, with stable VS. Patient is informed of clinical course, understands medical decision making process, and agrees with plan. Opportunity for questions provided and all questions answered. Return precautions given.   Final Clinical Impressions(s) / ED Diagnoses   Final diagnoses:  Gastrointestinal hemorrhage associated with gastritis, unspecified gastritis    New Prescriptions New Prescriptions   No medications on file     Recardo Evangelist, PA-C 05/16/16 [place], MD 05/19/16 1626

## 2016-05-13 NOTE — ED Triage Notes (Signed)
Pt from home with c/o emesis since yesterday afternoon.  Pt reports generalized chest tightness and upper pain between his shoulder blades.  Pt states he has had an MI and stents, reports hiatal hernia.  No relief from PO phenergan.  Pt in additionally believes he may be vomiting some blood now with report of ulcers.  NAD, A&O.

## 2016-05-13 NOTE — ED Notes (Signed)
Patient transported to X-ray 

## 2016-05-13 NOTE — Discharge Instructions (Signed)
Please take antacid medicines to help with the bleeding. Please follow up with your primary care doctor and GI doctor for further evaluation

## 2016-05-13 NOTE — ED Notes (Signed)
This RN tried had 2 unsuccessful attempts for a 20G in the [facility].

## 2016-05-17 ENCOUNTER — Emergency Department (HOSPITAL_COMMUNITY)
Admission: EM | Admit: 2016-05-17 | Discharge: 2016-05-17 | Disposition: A | Discharge status: Home / Self Care | Payer: Medicaid Other | Attending: Emergency Medicine | Admitting: Emergency Medicine

## 2016-05-17 ENCOUNTER — Encounter (HOSPITAL_COMMUNITY): Payer: Self-pay

## 2016-05-17 ENCOUNTER — Emergency Department (HOSPITAL_COMMUNITY): Payer: Medicaid Other

## 2016-05-17 DIAGNOSIS — E119 Type 2 diabetes mellitus without complications: Secondary | ICD-10-CM | POA: Insufficient documentation

## 2016-05-17 DIAGNOSIS — Z96651 Presence of right artificial knee joint: Secondary | ICD-10-CM | POA: Insufficient documentation

## 2016-05-17 DIAGNOSIS — M25561 Pain in right knee: Secondary | ICD-10-CM | POA: Insufficient documentation

## 2016-05-17 DIAGNOSIS — Z7982 Long term (current) use of aspirin: Secondary | ICD-10-CM | POA: Insufficient documentation

## 2016-05-17 DIAGNOSIS — Z955 Presence of coronary angioplasty implant and graft: Secondary | ICD-10-CM | POA: Insufficient documentation

## 2016-05-17 DIAGNOSIS — I2510 Atherosclerotic heart disease of native coronary artery without angina pectoris: Secondary | ICD-10-CM | POA: Insufficient documentation

## 2016-05-17 DIAGNOSIS — Z79899 Other long term (current) drug therapy: Secondary | ICD-10-CM | POA: Insufficient documentation

## 2016-05-17 DIAGNOSIS — I10 Essential (primary) hypertension: Secondary | ICD-10-CM | POA: Insufficient documentation

## 2016-05-17 NOTE — ED Notes (Signed)
Pt c/o pain to right knee.  St's knee was hurting this am when he woke up

## 2016-05-17 NOTE — Progress Notes (Signed)
Orthopedic Tech Progress Note Patient Details:  Andrew Huerta 08-27-1966 [id number]  Ortho Devices Type of Ortho Device: Crutches Ortho Device/Splint Interventions: Ordered, Adjustment   Braulio Bosch 05/17/2016, 7:10 PM

## 2016-05-17 NOTE — ED Triage Notes (Signed)
Pt is here with complaint of right knee pain. He reports he woke up and the knee was bothering him. He reports he felt it "pop." Has hx of bone spurs and restless leg syndrome.

## 2016-05-17 NOTE — ED Provider Notes (Signed)
[facility] DEPT Provider Note   CSN: [id number] Arrival date & time: 05/17/16  1518  By signing my name below, I, Jeanell Sparrow, attest that this documentation has been prepared under the direction and in the presence of non-physician practitioner, Montine Circle, PA-C. Electronically Signed: Jeanell Sparrow, Scribe. 05/17/2016. 5:09 PM  History   Chief Complaint Chief Complaint  Patient presents with  . Knee Pain   The history is provided by the patient. No language interpreter was used.   HPI Comments: Andrew Huerta is a 50 y.o. male with a hx of chronic knee pain who presents to the Emergency Department complaining of constant moderate right knee pain that started last night. He states last night he heard a knee "pop" sound with onset of pain, and the pain worsened this morning. Pain is exacerbated by touch and ambulation, and unrelieved by OTC pain medication. Reports a hx of knee replacement 20-25 years ago. Denies any other complaints.   Past Medical History:  Diagnosis Date  . Adrenal tumor    a. s/p adrenal gland resection at [facility]. left side removal per patient  . Anxiety   . Barrett's esophagus    EGD - 11/27/09 - short segment of Barrett's  . CAD (coronary artery disease)    a. 04/27/14 [place] s/p DES to mLAD and DES to mLCx  . Chronic back pain    upper and lower per patient  . Chronic chest pain   . Degenerative joint disease    Bilateral knees. Significant knee pain since playing football in high school. also in back per patient  . Depression   . Diabetes mellitus type 2, controlled ([place]) 08/29/2015   Diagnosed by A1c 7.6% during 08/26/15 hospital admission for chest pain  . GERD (gastroesophageal reflux disease)   . Hiatal hernia    EGD - 11/27/2009  . Hyperlipidemia   . Hypertension   . Low back pain   . Primary hyperaldosteronism ([place]) 01/22/2012   S/P adrenalectomy     . Sleep apnea   . Stone, kidney     Patient Active Problem List   Diagnosis Date Noted   . Renal cyst, right 11/12/2015  . Diabetes mellitus type 2, controlled ([place]) 08/29/2015  . Chest pain 08/25/2015  . Chronic back pain 04/27/2015  . Left forearm pain 04/27/2015  . Chest pain with moderate risk of acute coronary syndrome   . New onset seizure ([place]) 09/29/2014  . Neck pain 09/25/2014  . Encounter for chronic pain management 08/09/2014  . CAD- 2 V PCI Aug 2015   . Hyperlipidemia   . Nephrolithiasis 10/12/2013  . Primary hyperaldosteronism ([place]) 09/13/2013  . Major depression ([place]) 07/26/2013  . Restless legs syndrome 01/05/2013  . PTSD (post-traumatic stress disorder) 05/07/2012  . Back pain, thoracic 04/14/2012  . Compression fracture 02/05/2012  . Gastroesophageal reflux disease with hiatal hernia 02/05/2012  . Hypercholesterolemia 02/05/2012  . Herniation of nucleus pulposus 02/05/2012  . Apnea, sleep 02/05/2012  . Essential (primary) hypertension 02/05/2012  . Clinical depression 02/05/2012  . Headache(784.0) 11/14/2011  . Atypical chest pain 08/14/2011  . Enlarged prostate 03/26/2011  . DJD-knees- needs TKR 01/08/2010  . Essential hypertension 05/02/2008  . Barrett's esophagus 10/29/2006    Past Surgical History:  Procedure Laterality Date  . ADRENALECTOMY  10/2013  . CARDIAC CATHETERIZATION  05/01/2014   Patent stents, other disease unchanged  . CARDIAC CATHETERIZATION  04/27/2014   Procedure: CORONARY STENT INTERVENTION;  Surgeon: Peter M Martinique, MD;  Location: [facility] CATH  LAB;  Service: Cardiovascular;;  DES mid Cx  DES mid LAD  . CARDIAC CATHETERIZATION N/A 03/22/2015   Procedure: Left Heart Cath and Coronary Angiography;  Surgeon: Sherren Mocha, MD;  Location: [facility] CV LAB;  Service: Cardiovascular;  Laterality: N/A;  . CORONARY ANGIOPLASTY WITH STENT PLACEMENT  04/27/2014   3.0 x 16 mm Promus DES to the mid LAD and 3.5 x 28 mm Promus to the mid LCx, otherwise 20-30 percent lesions, EF 55%  . KIDNEY STONE SURGERY  X 1  . KNEE ARTHROPLASTY Right  1984  . KNEE ARTHROSCOPY Right X 6  . LEFT HEART CATHETERIZATION WITH CORONARY ANGIOGRAM N/A 04/27/2014   Procedure: LEFT HEART CATHETERIZATION WITH CORONARY ANGIOGRAM;  Surgeon: Peter M Martinique, MD;  Location: [facility] CATH LAB;  Service: Cardiovascular;  Laterality: N/A;  . LEFT HEART CATHETERIZATION WITH CORONARY ANGIOGRAM N/A 05/01/2014   Procedure: LEFT HEART CATHETERIZATION WITH CORONARY ANGIOGRAM;  Surgeon: Burnell Blanks, MD;  Location: [facility] CATH LAB;  Service: Cardiovascular;  Laterality: N/A;  . LITHOTRIPSY  X 2       Home Medications    Prior to Admission medications   Medication Sig Start Date End Date Taking? Authorizing Provider  aspirin EC 81 MG tablet Take 81 mg by mouth daily.    Historical Provider, MD  HYDROcodone-acetaminophen (NORCO/VICODIN) 5-325 MG tablet Take 1-2 tablets by mouth every 8 (eight) hours as needed for moderate pain. 05/02/16   Smiley Houseman, MD  omeprazole (PRILOSEC) 20 MG capsule Take 1 capsule (20 mg total) by mouth daily. 05/13/16   Recardo Evangelist, PA-C  promethazine (PHENERGAN) 25 MG tablet Take 1 tablet (25 mg total) by mouth 2 (two) times daily. 05/13/16   Recardo Evangelist, PA-C  sucralfate (CARAFATE) 1 g tablet Take 1 tablet (1 g total) by mouth 4 (four) times daily -  with meals and at bedtime. 05/13/16   Recardo Evangelist, PA-C    Family History Family History  Problem Relation Age of Onset  . Cancer Father     stomach cancer  . Diabetes Father   . Heart attack Paternal Grandfather   . Diabetes Paternal Grandmother     Social History Social History  Substance Use Topics  . Smoking status: Never Smoker  . Smokeless tobacco: Never Used  . Alcohol use 0.0 oz/week     Comment: "seldom" per patient     Allergies   Cortisone acetate; Darvocet [propoxyphene n-acetaminophen]; Nsaids; and Xanax [alprazolam]   Review of Systems Review of Systems  Constitutional: Negative for fever.  Musculoskeletal: Positive for arthralgias,  joint swelling and myalgias.  All other systems reviewed and are negative.    Physical Exam Updated Vital Signs BP 125/94 (BP Location: Left Arm)   Pulse 104   Temp 97.9 F (36.6 C) (Oral)   Resp 14   Ht 5\' 7"  (1.702 m)   Wt 150 lb (68 kg)   SpO2 99%   BMI 23.49 kg/m   Physical Exam  Nursing note and vitals reviewed.  Physical Exam  Constitutional: Pt appears well-developed and well-nourished. No distress.  HENT:  Head: Normocephalic and atraumatic.  Eyes: Conjunctivae are normal.  Neck: Normal range of motion.  Cardiovascular: Normal rate, regular rhythm and intact distal pulses.   Capillary refill < 3 sec  Pulmonary/Chest: Effort normal and breath sounds normal.  Musculoskeletal: Pt exhibits tenderness to palpation over the medial aspect, no visible effusion, no erythema or signs of cellulitis, no masses. Pt exhibits no edema.  ROM: 4/5 limited and extreme ranges of motion due to pain  Neurological: Pt  is alert. Coordination normal.  Sensation 5/5 Strength 5/5  Skin: Skin is warm and dry. Pt is not diaphoretic.  No tenting of the skin  Psychiatric: Pt has a normal mood and affect.  Nursing note and vitals reviewed.   ED Treatments / Results  DIAGNOSTIC STUDIES: Oxygen Saturation is 99% on RA, normal by my interpretation.   COORDINATION OF CARE: 5:13 PM- Pt advised of plan for treatment and pt agrees.  Labs (all labs ordered are listed, but only abnormal results are displayed) Labs Reviewed - No data to display  EKG  EKG Interpretation None       Radiology Dg Knee Complete 4 Views Right  Result Date: 05/17/2016 CLINICAL DATA:  Pain and popping in the right knee. EXAM: RIGHT KNEE - COMPLETE 4+ VIEW COMPARISON:  02/09/2012 right knee radiograph FINDINGS: No fracture, joint effusion or dislocation. No suspicious focal osseous lesion. There is moderate to severe tricompartmental osteoarthritis, with bulky marginal osteophytes in the lateral and  patellofemoral compartment, mildly progressed in the interval. No radiopaque foreign body. IMPRESSION: 1. No fracture, joint effusion or malalignment . 2. Moderate to severe tricompartmental right knee osteoarthritis, mildly progressed. Electronically Signed   By: Ilona Sorrel M.D.   On: 05/17/2016 18:28    Procedures Procedures (including critical care time)  Medications Ordered in ED Medications - No data to display   Initial Impression / Assessment and Plan / ED Course  I have reviewed the triage vital signs and the nursing notes.  Pertinent labs & imaging results that were available during my care of the patient were reviewed by me and considered in my medical decision making (see chart for details).  Clinical Course     Patient X-Ray negative for obvious fracture or dislocation. Pt advised to follow up with orthopedics if symptoms persist for possibility of missed fracture diagnosis. Patient given brace while in ED, conservative therapy recommended and discussed. Patient will be dc home & is agreeable with above plan.   Final Clinical Impressions(s) / ED Diagnoses   Final diagnoses:  Right knee pain    New Prescriptions New Prescriptions   No medications on file   I personally performed the services described in this documentation, which was scribed in my presence. The recorded information has been reviewed and is accurate.       Montine Circle, PA-C 05/17/16 1848    Nat Christen, MD 05/17/16 2209
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
## 2016-05-30 ENCOUNTER — Encounter: Payer: Self-pay | Admitting: Internal Medicine

## 2016-05-30 ENCOUNTER — Ambulatory Visit (INDEPENDENT_AMBULATORY_CARE_PROVIDER_SITE_OTHER): Payer: Self-pay | Admitting: Internal Medicine

## 2016-05-30 VITALS — BP 120/85 | HR 88 | Temp 97.7°F | Ht 67.0 in | Wt 154.0 lb

## 2016-05-30 DIAGNOSIS — E119 Type 2 diabetes mellitus without complications: Secondary | ICD-10-CM

## 2016-05-30 DIAGNOSIS — G8929 Other chronic pain: Secondary | ICD-10-CM

## 2016-05-30 DIAGNOSIS — Z23 Encounter for immunization: Secondary | ICD-10-CM

## 2016-05-30 DIAGNOSIS — E785 Hyperlipidemia, unspecified: Secondary | ICD-10-CM

## 2016-05-30 DIAGNOSIS — M170 Bilateral primary osteoarthritis of knee: Secondary | ICD-10-CM

## 2016-05-30 LAB — LIPID PANEL
CHOLESTEROL: 272 mg/dL — AB (ref 125–200)
HDL: 52 mg/dL (ref >=40)
LDL Cholesterol: 197 mg/dL — ABNORMAL HIGH (ref <130)
Total CHOL/HDL Ratio: 5.2 Ratio — ABNORMAL HIGH (ref <=5.0)
Triglycerides: 113 mg/dL (ref <150)
VLDL: 23 mg/dL (ref <30)

## 2016-05-30 LAB — POCT GLYCOSYLATED HEMOGLOBIN (HGB A1C): Hemoglobin A1C: 6

## 2016-05-30 LAB — COMPLETE METABOLIC PANEL WITH GFR
ALBUMIN: 4.7 g/dL (ref 3.6–5.1)
ALK PHOS: 51 U/L (ref 40–115)
ALT: 20 U/L (ref 9–46)
AST: 16 U/L (ref 10–35)
BUN: 20 mg/dL (ref 7–25)
CALCIUM: 9.7 mg/dL (ref 8.6–10.3)
CHLORIDE: 102 mmol/L (ref 98–110)
CO2: 27 mmol/L (ref 20–31)
CREATININE: 0.98 mg/dL (ref 0.70–1.33)
GFR, Est African American: >89 mL/min (ref >=60)
GFR, Est Non African American: >89 mL/min (ref >=60)
Glucose, Bld: 125 mg/dL — ABNORMAL HIGH (ref 65–99)
Potassium: 4.4 mmol/L (ref 3.5–5.3)
Sodium: 139 mmol/L (ref 135–146)
Total Bilirubin: 0.7 mg/dL (ref 0.2–1.2)
Total Protein: 7.4 g/dL (ref 6.1–8.1)

## 2016-05-30 MED ORDER — METOPROLOL TARTRATE 25 MG PO TABS: 12.5000 mg | ORAL_TABLET | Freq: Two times a day (BID) | ORAL | 0 refills | Status: DC

## 2016-05-30 MED ORDER — ATORVASTATIN CALCIUM 80 MG PO TABS: 80.0000 mg | ORAL_TABLET | Freq: Every day | ORAL | 0 refills | Status: DC

## 2016-05-30 MED ORDER — HYDROCODONE-ACETAMINOPHEN 5-325 MG PO TABS
1.0000 | ORAL_TABLET | Freq: Three times a day (TID) | ORAL | 0 refills | Status: DC | PRN
Start: 2016-05-30 — End: 2016-06-30

## 2016-05-30 NOTE — Patient Instructions (Signed)
Follow up in about 1 month   Start Metoprolol and atorvastatin if you are able to afford  We will get labs today

## 2016-05-31 ENCOUNTER — Encounter: Payer: Self-pay | Admitting: Internal Medicine

## 2016-05-31 LAB — MICROALBUMIN, URINE: MICROALB UR: 0.2 mg/dL

## 2016-05-31 NOTE — Progress Notes (Signed)
Sent letter to patient regarding lab results. We already re-initiated Atorvastatin. Since A1c has improved without metformin, we will try to manage with diet and recheck in 3 months.

## 2016-06-01 NOTE — Assessment & Plan Note (Addendum)
Currently not on a BB or Statin due to expiration of prescription for this and lack of clinic follow up. Is interested in restarting. Has not followed up with cardiology 2/2 financial reasons - start with Metoprolol 12.5 BID  - Atorvastatin 80mg  dialy - CMP  - follow up in 1 month

## 2016-06-01 NOTE — Assessment & Plan Note (Signed)
Recent ED visit for hematemesis. Currently symptoms resolved with PPI and Carafate. Needs to be seen by GI for further evaluation, however currently does not have insurance. Currently working on Print production planner.

## 2016-06-01 NOTE — Assessment & Plan Note (Signed)
>>  ASSESSMENT AND PLAN FOR OTHER GASTRITIS WITH BLEEDING WRITTEN ON 06/06/2024  2:44 PM BY ELICIA HAMLET, MD   >>ASSESSMENT AND PLAN FOR BARRETT'S ESOPHAGUS WRITTEN ON 06/01/2016  6:52 PM BY GUNADASA, KANISHKA G, MD  Recent ED visit for hematemesis. Currently symptoms resolved with PPI and Carafate . Needs to be seen by GI for further evaluation, however currently does not have insurance. Currently working on Museum/gallery curator.

## 2016-06-01 NOTE — Assessment & Plan Note (Signed)
>>  ASSESSMENT AND PLAN FOR BARRETT'S ESOPHAGUS WRITTEN ON 06/01/2016  6:52 PM BY GUNADASA, KANISHKA G, MD  Recent ED visit for hematemesis. Currently symptoms resolved with PPI and Carafate . Needs to be seen by GI for further evaluation, however currently does not have insurance. Currently working on Museum/gallery curator.

## 2016-06-01 NOTE — Assessment & Plan Note (Signed)
Currently not on a statin as prescribed. Has not followed up for refills/clinic visits. Interested in restarting Lipitor.  - CMP and Lipid panel today

## 2016-06-01 NOTE — Assessment & Plan Note (Signed)
Reports has not been on previously prescribed Metformin for months due to lack of follow up. A1c today 6 without medication. Since patient has financial stressors already, will try to continue to manage with diet and lifestyle. Discussed appropriate diet.  - urine micro-albumin today wnl - FLu shot today - UTD on PNA vaccine - follow up in 3 months

## 2016-06-01 NOTE — Assessment & Plan Note (Addendum)
Chronic and overall stable.  - UDS today - pain contract renewed  - will need to review database

## 2016-06-02 LAB — PAIN MGMT, PROFILE 4 CONF W/O MM, U
Alphahydroxyalprazolam: NEGATIVE ng/mL (ref <25)
Alphahydroxymidazolam: NEGATIVE ng/mL (ref <50)
Alphahydroxytriazolam: NEGATIVE ng/mL (ref <50)
Aminoclonazepam: NEGATIVE ng/mL (ref <25)
Amphetamines: NEGATIVE ng/mL (ref <500)
BARBITURATES: NEGATIVE ng/mL (ref <300)
Benzodiazepines: POSITIVE ng/mL — AB (ref <100)
COCAINE METABOLITE: NEGATIVE ng/mL (ref <150)
Creatinine: 4.7 mg/dL — ABNORMAL LOW (ref >=20.0)
Hydroxyethylflurazepam: NEGATIVE ng/mL (ref <50)
LORAZEPAM: 64 ng/mL — AB (ref <50)
Methadone Metabolite: NEGATIVE ng/mL (ref <100)
NORDIAZEPAM: NEGATIVE ng/mL (ref <50)
Opiates: NEGATIVE ng/mL (ref <100)
Oxazepam: NEGATIVE ng/mL (ref <50)
Oxidant: NEGATIVE ug/mL (ref <200)
Oxycodone: NEGATIVE ng/mL (ref <100)
Phencyclidine: NEGATIVE ng/mL (ref <25)
Specific Gravity: 1.008 (ref >=1.003)
TEMAZEPAM: NEGATIVE ng/mL (ref <50)
pH: 7.09 (ref 4.5–9.0)

## 2016-06-09 ENCOUNTER — Telehealth: Payer: Self-pay | Admitting: Internal Medicine

## 2016-06-10 ENCOUNTER — Telehealth: Payer: Self-pay | Admitting: Internal Medicine

## 2016-06-10 NOTE — Telephone Encounter (Signed)
Attempted to call patient regarding UDS results. No answer and did not go to voicemail. Will attempt again tomorrow.

## 2016-06-10 NOTE — Telephone Encounter (Signed)
Opened in error

## 2016-06-11 ENCOUNTER — Telehealth: Payer: Self-pay | Admitting: Internal Medicine

## 2016-06-11 DIAGNOSIS — G8929 Other chronic pain: Secondary | ICD-10-CM

## 2016-06-11 NOTE — Telephone Encounter (Signed)
Called patient to discuss UDS positive for Benzo; he is not prescribed this medication. Reports that mother has stage 4 cancer. Reports that he was listening to her prognosis and thought he was going to be a panic attack. So he took one of his mother's benzodiazepine. We discussed the dangers of taking benzo while he is on chronic pain medication. We discussed that this type of behavior is not acceptable especially since he has a pain contract with our clinic. We discussed that if he wishes to obtain opioid for chronic pain from this medication, he will need to make a clinic visit every month for the next few months and have monthly UDS. If his screen is positive for anything other than what is prescribed to him, this will be a breach of his contract. Patient understood.

## 2016-06-11 NOTE — Assessment & Plan Note (Signed)
Called patient to discuss UDS positive for Benzo; he is not prescribed this medication. Reports that mother has stage 4 cancer. Reports that he was listening to her prognosis and thought he was going to be a panic attack. So he took one of his mother's benzodiazepine. We discussed the dangers of taking benzo while he is on chronic pain medication. We discussed that this type of behavior is not acceptable especially since he has a pain contract with our clinic. We discussed that if he wishes to obtain opioid for chronic pain from this medication, he will need to make a clinic visit every month for the next few months and have monthly UDS. If his screen is positive for anything other than what is prescribed to him, this will be a breach of his contract. Patient understood.

## 2016-06-29 NOTE — Progress Notes (Signed)
   [facility] Phone: [phone]   Date of Visit: 06/30/2016   HPI:  Leg Pain:  Right leg pain - lateral knee bone down to the ankle - sometimes makes the leg give out - shooting/stabbing pain - pain is intermittent: aggravated by walking or driving - reports worsening pain since he was seen for R knee pain on 05/17/16: No fracture, joint effusion or dislocation. No suspicious focal osseous lesion. There is moderate to severe tricompartmental osteoarthritis, with bulky marginal osteophytes in the lateral and patellofemoral compartment, mildly progressed in the interval. - alleviated by rest  - no inciting event or trauma - has chronic bilateral knee pain and significant arthritis. Recommended referral to orthopedics but does not have insurance  - started working 20 hours a week  - no swelling/bruising - chronic numnbes in the knee - uses  vicodin: it takes a while to help  ROS: See HPI.  [place]:  PMH:  HTN, CAD s/p stent to mLAD and mLCx 04/2014 with [place] in 03/2015 with mild non-obstructive CAD DM2, HLD Barrett's Esophagus: CT angio chest in 05/2016 with diffuse wall thickening of the esophagus with right asymmetrical  thickening of the distal esophagus.  GERD with Small Hiatal Hernia (EGD 10/2011) Primary Hyperaldosteronism s/p adrenalectomy: CT angio in 05/2016 1.8 cm enhancing nodule in the right adrenal gland which has increased compared to the previous examination. This was previously thought to represent an adenoma; given interval change in appearance, suggest further evaluation with non emergent MRI DJD R > L: X-ray 01/2012: prominent tri-compartment degenerative changes (R); x-ray R knee 05/2016 with mod-severe tricompartmental OA mildly progressed.  Chronic Back Pain  Compression Fracture Herniation of Nucleus Pulposus Nephrolithiasis/R renal Cyst: CT angio 05/2016: 1.5 cm exophytic cyst arising from medial cortex of the mid right kidney. There is a focal  area of hypo  enhancement within the anteromedial cortex of the upper pole left kidney which may relate to an area of focal scarring Major Depression, PTSD, Restless Leg Syndrome Enlarged Prostrate: CT angio 05/2016 Coarse prostate gland calcification Hx Seizure x 1 Sleep Apnea   PHYSICAL EXAM: BP 130/73   Pulse 96   Temp 98.4 F (36.9 C) (Oral)   Ht 5\' 7"  (1.702 m)   Wt 153 lb 9.6 oz (69.7 kg)   SpO2 98%   BMI 24.06 kg/m  GEN: NAD HEENT: Atraumatic, normocephalic, neck supple, EOMI, sclera clear  CV: RRR, no murmurs, rubs, or gallops PULM: CTAB, normal effort MSK: Right Knee: tenderness to palpation of the lateral knee joint over the lateral ligament and lateral malleolus. No effusion of the knee or ankle. No erythema. Some decreased ROM of R knee which is chronic per patient. Pain of knee with [place].  Normal ROM of ankle. Gait: mild limp on the right that intermittently resolves.   ASSESSMENT/PLAN:  Acute on Chronic Flare of OA of R Knee: - patient working on obtaining insurance so that he can be referred to orthopedics - will try Gabapentin 100mg  for pain: starting with 1 tab at bedtime. Patient to increase to 1 tab TID if tolerating.   Right Ankle Pain:  No trauma in history but tenderness to palpation of lateral malleolus. Therefore will obtain x-ray ankle.   Follow up in 3 weeks  Smiley Houseman, MD PGY [place]

## 2016-06-30 ENCOUNTER — Encounter: Payer: Self-pay | Admitting: Internal Medicine

## 2016-06-30 ENCOUNTER — Ambulatory Visit (INDEPENDENT_AMBULATORY_CARE_PROVIDER_SITE_OTHER): Payer: Self-pay | Admitting: Internal Medicine

## 2016-06-30 VITALS — BP 130/73 | HR 96 | Temp 98.4°F | Ht 67.0 in | Wt 153.6 lb

## 2016-06-30 DIAGNOSIS — M170 Bilateral primary osteoarthritis of knee: Secondary | ICD-10-CM

## 2016-06-30 DIAGNOSIS — G8929 Other chronic pain: Secondary | ICD-10-CM

## 2016-06-30 DIAGNOSIS — M25571 Pain in right ankle and joints of right foot: Secondary | ICD-10-CM

## 2016-06-30 MED ORDER — GABAPENTIN 100 MG PO CAPS: 100.0000 mg | ORAL_CAPSULE | Freq: Every day | ORAL | 0 refills | Status: DC

## 2016-06-30 MED ORDER — HYDROCODONE-ACETAMINOPHEN 5-325 MG PO TABS
1.0000 | ORAL_TABLET | Freq: Three times a day (TID) | ORAL | 0 refills | Status: DC | PRN
Start: 2016-06-30 — End: 2016-07-23

## 2016-06-30 NOTE — Patient Instructions (Addendum)
Try Gabapentin 1 tablet at night. Can go up to 1 tablet three times a day.  We will get an x-ray of your ankle.   Follow up in about 3 weeks.

## 2016-07-03 LAB — PAIN MGMT, PROFILE 4 CONF W/O MM, U
AMPHETAMINES: NEGATIVE ng/mL (ref <500)
Barbiturates: NEGATIVE ng/mL (ref <300)
Benzodiazepines: NEGATIVE ng/mL (ref <100)
COCAINE METABOLITE: NEGATIVE ng/mL (ref <150)
Codeine: NEGATIVE ng/mL (ref <50)
Creatinine: 129.8 mg/dL (ref >=20.0)
HYDROCODONE: 7971 ng/mL — AB (ref <50)
Hydromorphone: NEGATIVE ng/mL (ref <50)
Methadone Metabolite: NEGATIVE ng/mL (ref <100)
Morphine: NEGATIVE ng/mL (ref <50)
Norhydrocodone: NEGATIVE ng/mL (ref <50)
OXIDANT: NEGATIVE ug/mL (ref <200)
OXYCODONE: NEGATIVE ng/mL (ref <100)
Opiates: POSITIVE ng/mL — AB (ref <100)
PH: 6.9 (ref 4.5–9.0)
Phencyclidine: NEGATIVE ng/mL (ref <25)

## 2016-07-18 ENCOUNTER — Ambulatory Visit (HOSPITAL_COMMUNITY)
Admission: RE | Admit: 2016-07-18 | Discharge: 2016-07-18 | Disposition: A | Discharge status: Home / Self Care | Payer: Self-pay | Source: Ambulatory Visit | Attending: Family Medicine | Admitting: Family Medicine

## 2016-07-18 ENCOUNTER — Encounter: Payer: Self-pay | Admitting: Internal Medicine

## 2016-07-18 DIAGNOSIS — M25571 Pain in right ankle and joints of right foot: Secondary | ICD-10-CM | POA: Insufficient documentation

## 2016-07-18 DIAGNOSIS — M19071 Primary osteoarthritis, right ankle and foot: Secondary | ICD-10-CM | POA: Insufficient documentation

## 2016-07-22 NOTE — Progress Notes (Signed)
[facility] Phone: [phone]   Date of Visit: 07/23/2016   HPI:  Andrew Huerta is here for follow up for chronic pain management. He is currently being seen monthly for chronic pain as his prior UDS in September 2017 was positive for Benzodiazepine which is not prescribed to him. His UDS in [place] was negative for benzo,.  - reports needing 3-4 pills a day to somewhat control his pain.  Reports of the following: 0.5-1 tab at 11am. 0.5 tab around 1-2pm depending on severity. 0.5 tab 4-5pm.  - reports that this medication helps him complete his daily responsibilities but pain is not controlled currently.  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 Seems through out the past if he takes anything in the past it makes his restless leg worse. Only took it at night but has not tried during the day.  Depression: - reports his medical issues make him feel down. Reports that he has many medical issues for his age - also has a difficult time dealing with mother's illness. She was diagnosed with cancer. - has thoughts of him better off being dead but reports he "would never go there"  - has a good friend that is supportive  - has tried multiple medications in the past but discontinued due to side effects - has tried therapy in the past and is hesitant to re-start but willing to try.  - increased # 100   ROS: See HPI.  [place]:  PMH:  HTN, CAD s/p stent to mLAD and mLCx 04/2014 with [place] in 03/2015 with mild non-obstructive CAD DM2, HLD Barrett's Esophagus: CT angio chest in 05/2016 with diffuse wall thickening of the esophagus with right asymmetrical  thickening of the distal esophagus.  GERD with Small Hiatal Hernia (EGD 10/2011) Primary Hyperaldosteronism s/p adrenalectomy: CT angio in 05/2016 1.8 cm enhancing nodule in the right adrenal gland which has increased compared to the previous examination.  This was previously thought to represent an adenoma; given interval change in appearance, suggest further evaluation with non emergent MRI DJD R > L: X-ray 01/2012: prominent tri-compartment degenerative changes (R); x-ray R knee 05/2016 with mod-severe tricompartmental OA mildly progressed.  Chronic Back Pain  Compression Fracture Herniation of Nucleus Pulposus Nephrolithiasis/R renal Cyst: CT angio 05/2016: 1.5 cm exophytic cyst arising from medial cortex of the mid right kidney. There is a focal area of hypo enhancement within the anteromedial cortex of the upper pole left kidney which may relate to an area of focal scarring Major Depression, PTSD, Restless Leg Syndrome Enlarged Prostrate: CT angio 05/2016 Coarse prostate gland calcification Hx Seizure x 1 Sleep Apnea   PHYSICAL EXAM: BP 124/80   Pulse 88   Temp 98.3 F (36.8 C) (Oral)   Ht 5\' 7"  (1.702 m)   Wt 155 lb (70.3 kg)   SpO2 97%   BMI 24.28 kg/m  GEN: NAD CV: RRR, no murmurs, rubs, or gallops PULM: CTAB, normal effort SKIN: No rash or cyanosis; warm and well-perfused EXTR: No lower extremity edema or calf tenderness PSYCH: tearful at times when discussing stressors in his life NEURO: Awake, alert, no focal deficits grossly, normal speech  ASSESSMENT/PLAN:  Clinical depression Declines pharmacotherapy. Is willing to talk to behavioral health. Contact information given.   Encounter for chronic pain management Symptoms are not controlled currently with #90 pills a month. Will increase #100 a month. Reviewed narcotic  database and no red flags. UDS collected today.  - Follow up in 1 month  Patient needs to get insurance set up. Discussed during visit.   Smiley Houseman, MD PGY [place]

## 2016-07-23 ENCOUNTER — Encounter: Payer: Self-pay | Admitting: Internal Medicine

## 2016-07-23 ENCOUNTER — Ambulatory Visit (INDEPENDENT_AMBULATORY_CARE_PROVIDER_SITE_OTHER): Payer: Self-pay | Admitting: Internal Medicine

## 2016-07-23 VITALS — BP 124/80 | HR 88 | Temp 98.3°F | Ht 67.0 in | Wt 155.0 lb

## 2016-07-23 DIAGNOSIS — M170 Bilateral primary osteoarthritis of knee: Secondary | ICD-10-CM

## 2016-07-23 DIAGNOSIS — F32A Depression, unspecified: Secondary | ICD-10-CM

## 2016-07-23 DIAGNOSIS — F329 Major depressive disorder, single episode, unspecified: Secondary | ICD-10-CM

## 2016-07-23 DIAGNOSIS — G8929 Other chronic pain: Secondary | ICD-10-CM

## 2016-07-23 MED ORDER — HYDROCODONE-ACETAMINOPHEN 5-325 MG PO TABS: 1.0000 | ORAL_TABLET | Freq: Three times a day (TID) | ORAL | 0 refills | Status: DC | PRN

## 2016-07-23 NOTE — Patient Instructions (Addendum)
Try Gabapentin during the day. You can try in the morning or afternoon.  Please call behavioral health to see if you are interested in counseling.   Please make a follow up appointment in 1 month.   Please try to get your insurance set up. This is very important.

## 2016-07-23 NOTE — Assessment & Plan Note (Signed)
Declines pharmacotherapy. Is willing to talk to behavioral health. Contact information given.

## 2016-07-23 NOTE — Assessment & Plan Note (Addendum)
Symptoms are not controlled currently with #90 pills a month. Will increase #100 a month. Reviewed narcotic database and no red flags. UDS collected today.  - Follow up in 1 month

## 2016-07-28 LAB — PAIN MGMT, PROFILE 4 W/CONF, U
AMPHETAMINES: NEGATIVE ng/mL (ref <500)
BARBITURATES: NEGATIVE ng/mL (ref <300)
Benzodiazepines: NEGATIVE ng/mL (ref <100)
CREATININE: 100.7 mg/dL (ref >=20.0)
Cocaine Metabolite: NEGATIVE ng/mL (ref <150)
Codeine: NEGATIVE ng/mL (ref <50)
Hydrocodone: 2580 ng/mL — ABNORMAL HIGH (ref <50)
Hydromorphone: NEGATIVE ng/mL (ref <50)
METHADONE METABOLITE: NEGATIVE ng/mL (ref <100)
MORPHINE: NEGATIVE ng/mL (ref <50)
NORHYDROCODONE: NEGATIVE ng/mL (ref <50)
OXIDANT: NEGATIVE ug/mL (ref <200)
Opiates: POSITIVE ng/mL — AB (ref <100)
Oxycodone: NEGATIVE ng/mL (ref <100)
PH: 6.82 (ref 4.5–9.0)
Phencyclidine: NEGATIVE ng/mL (ref <25)

## 2016-08-21 NOTE — Progress Notes (Signed)
[facility] Phone: [phone]   Date of Visit: 08/22/2016   HPI:  Chronic Pain: Mr. Nordmann is here for follow up for chronic pain management. He is currently being seen monthly for chronic pain as his prior UDS in September 2017 was positive for Benzodiazepine which is not prescribed to him. His UDS in [place] and November were negative for benzo.  - Tried Cymblata in the past: made him feel drowsy - recent increase in monthly tablets has improved symptoms during the day; reports that at night his leg pain gets worse. Knees and feet tingle at night. Gabapentin started at last visit, but still only taking 1 tab a night; has not tried to increase to 2 tabs. No issues with current Gabapentin  - reports taking the following: 1.5 tab before work, 1.5 after work, and 1 at night.   Progress toward pain control and promoting increased function:  Improved since increasing to #100 tab/month Current Non Narcotic Therapies: low dose Gabapentin Risk Assessment for Opioid Therapy     Asking for early refills; frequent telephone calls or lost prescriptions no   Narcotics from other health providers: no Urine Drug Screen reviewed: yes; will also obtain today   Drug Database reviewed: yes  Chronic Pain Follow-Up Assessment  Pain scale rating at its BEST in the past 24 hours (1 to 10): 6 Pain scale rating at its WORST in the past 24 hours (1 to 10): 10 Adverse Effects:  (None__ Nausea__ Vomiting_x_ Confusion_x_ Sleepiness_x_ Fatigue__ Constipation__ Other__). Reports this is not due to pain medication.  Treatment of Adverse Effects:  Since the last clinic visit, how much relief have pain treatment and medication provided? Please circle the one percentage that shows how much relief you have received: 0%__ 10%__ 20%__ 30%__ 40%__ 50%_x 60%__ 70%__ 80%__ 90%__ 100%__ Elta [place] the one number that describes how, during the past 24 hours, pain has interfered with your: General  Activity 0__ 1__ 2__ 3__ 4__ 5__ 6_x_ 7__ 8__ 9__ 10__ Does not interfere      Completely interferes Mood 0__ 1__ 2__ 3__ 4__ 5__ 6__ 7_x_ 8__ 9__ 10__ Does not interfere      Completely interferes Ability to work (in or out of the home) 0__ 1__ 2__ 3__ 4__ 5__ 6_x_ 7__ 8__ 9__ 10__ Does not interfere      Completely interferes Interactions with other people 0__ 1__ 2__ 3__ 4__ 5_x_ 6__ 7__ 8__ 9__ 10__ Does not interfere      Completely interferes Sleep 0__ 1__ 2__ 3__ 4__ 5__ 6__ 7__ 8__ 9_x_ 10__ Does not interfere      Completely interferes  Enjoyment of life 0__ 1__ 2__ 3__ 4__ 5__ 6__ 7_x_ 8__ 9__ 10__ Does not interfere      Completely interferes   Depression:  - has not had time to call Behavioral health as we discussed previously  - no SI/HI  - again declines pharmacologic treatment - PHQ9 completed   - he is will trying to get insurance. He cannot get orange card because he lives outside of [place]. Reports he will call [facility] financial assistance    ROS: See HPI.  [place]:  HTN, CAD s/p stent to mLAD and mLCx 04/2014 with [place] in 03/2015 with mild non-obstructive CAD DM2, HLD Barrett's Esophagus: CT angio chest in 05/2016 with diffuse wall thickening of the esophagus with right asymmetrical thickening of the distal esophagus. GERD with Small Hiatal Hernia (EGD 10/2011) Primary Hyperaldosteronism s/p  adrenalectomy: CT angio in 05/2016 1.8 cm enhancing nodule in the right adrenal gland which has increased compared to the previous examination. This was previously thought to represent an adenoma; given interval change in appearance, suggest further evaluation with non emergent MRI DJD R > L: X-ray 01/2012: prominent tri-compartment degenerative changes (R); x-ray R knee 05/2016 with mod-severe tricompartmental OA mildly progressed.  Chronic Back Pain  Compression Fracture Herniation of Nucleus Pulposus Nephrolithiasis/R renal Cyst: CT angio 05/2016: 1.5  cm exophytic cyst arising from medial cortex of the mid right kidney. There is a focal area of hypo enhancement within the anteromedial cortex of the upper pole left kidney which may relate to an area of focal scarring Major Depression, PTSD, Restless Leg Syndrome Enlarged Prostrate: CT angio 05/2016 Coarse prostate gland calcification Hx Seizure x 1 Sleep Apnea    PHYSICAL EXAM: BP 126/62   Pulse 86   Temp 97.6 F (36.4 C) (Oral)   Wt 157 lb 6.4 oz (71.4 kg)   SpO2 99%   BMI 24.65 kg/m  GEN: NAD CV: RRR, no murmurs, rubs, or gallops PULM: CTAB, normal effort ABD: Soft, nontender, nondistended, NABS, no organomegaly EXTR: No lower extremity edema or calf tenderness PSYCH: Mood and affect euthymic, normal rate and volume of speech NEURO: Awake, alert, no focal deficits grossly, normal speech   ASSESSMENT/PLAN:  Health maintenance:  - unable to get colonoscopy due to not having insurance   Encounter for chronic pain management Refill Norco. Asked patient to increase Gabapentin up to 300mg  at night. UDS today. Database reviewed and appropriate.   Clinical depression Continues to decline medication. Has not gotten in contact with behavioral health as discussed.    Discussed prostrate cancer screening. Patient currently unsure if he would like to proceed with DRE and PSA. Will let me know at next visit in 1 month.   Smiley Houseman, MD PGY [place]

## 2016-08-22 ENCOUNTER — Encounter: Payer: Self-pay | Admitting: Internal Medicine

## 2016-08-22 ENCOUNTER — Ambulatory Visit (INDEPENDENT_AMBULATORY_CARE_PROVIDER_SITE_OTHER): Payer: Self-pay | Admitting: Internal Medicine

## 2016-08-22 ENCOUNTER — Ambulatory Visit: Payer: Self-pay | Admitting: Internal Medicine

## 2016-08-22 VITALS — BP 126/62 | HR 86 | Temp 97.6°F | Wt 157.4 lb

## 2016-08-22 DIAGNOSIS — M170 Bilateral primary osteoarthritis of knee: Secondary | ICD-10-CM

## 2016-08-22 DIAGNOSIS — F32A Depression, unspecified: Secondary | ICD-10-CM

## 2016-08-22 DIAGNOSIS — G8929 Other chronic pain: Secondary | ICD-10-CM

## 2016-08-22 DIAGNOSIS — F329 Major depressive disorder, single episode, unspecified: Secondary | ICD-10-CM

## 2016-08-22 DIAGNOSIS — G894 Chronic pain syndrome: Secondary | ICD-10-CM

## 2016-08-22 MED ORDER — HYDROCODONE-ACETAMINOPHEN 5-325 MG PO TABS: 1.0000 | ORAL_TABLET | Freq: Three times a day (TID) | ORAL | 0 refills | Status: DC | PRN

## 2016-08-22 NOTE — Assessment & Plan Note (Addendum)
Refill Norco. Asked patient to increase Gabapentin up to 300mg  at night. UDS today. Database reviewed and appropriate.

## 2016-08-22 NOTE — Patient Instructions (Addendum)
Increase Gabapentin to 200mg  at bedtime (2 tablets)   Please follow up in 1 month.   [place]

## 2016-08-25 NOTE — Assessment & Plan Note (Signed)
Continues to decline medication. Has not gotten in contact with behavioral health as discussed.

## 2016-08-28 LAB — PAIN MGMT, PROFILE 4 CONF W/O MM, U
Amphetamines: NEGATIVE ng/mL (ref <500)
Barbiturates: NEGATIVE ng/mL (ref <300)
Benzodiazepines: NEGATIVE ng/mL (ref <100)
Cocaine Metabolite: NEGATIVE ng/mL (ref <150)
Codeine: NEGATIVE ng/mL (ref <50)
Creatinine: 101.1 mg/dL (ref >=20.0)
HYDROMORPHONE: NEGATIVE ng/mL (ref <50)
Hydrocodone: 5663 ng/mL — ABNORMAL HIGH (ref <50)
METHADONE METABOLITE: NEGATIVE ng/mL (ref <100)
MORPHINE: NEGATIVE ng/mL (ref <50)
Norhydrocodone: NEGATIVE ng/mL (ref <50)
OPIATES: POSITIVE ng/mL — AB (ref <100)
OXYCODONE: NEGATIVE ng/mL (ref <100)
Oxidant: NEGATIVE ug/mL (ref <200)
PHENCYCLIDINE: NEGATIVE ng/mL (ref <25)
pH: 7.21 (ref 4.5–9.0)

## 2016-09-21 NOTE — Progress Notes (Signed)
[facility] Phone: [phone]   Date of Visit: 09/22/2016   HPI:  Chronic Pain: Andrew Huerta is here for follow up for chronic pain management. He is currently being seen monthly for chronic pain as his prior UDS in September 2017 was positive for Benzodiazepine which is not prescribed to him. His UDS in [place] and November and December were negative for benzo. -  Other than Norco PRN, he is also on Gabapentin 2 tabs at night. Reports that this makes him feel tired some days. Reports he still moves a lot in bed. He reports that he has tried Requip in the past (last noted in chart was in 2014) and from what he recalls it made his symptoms worse.    - he also reports of feeling tired over the past month. He has been under more stress lately he reports. Possibly feeling more anxious.  - he again reports he does not want to be on medications; he is still hesitant to call behavioral health - no fevers or chills; no sick contacts.  - he still has not been able to get in contact with the hospital to figure out how he can get assistance with insurance.    ROS: See HPI.  Andrew Huerta:  PMH: HTN, CAD s/p stent to mLAD and mLCx 04/2014 with [place] in 03/2015 with mild non-obstructive CAD DM2, HLD Barrett's Esophagus: CT angio chest in 05/2016 with diffuse wall thickening of the esophagus with right asymmetrical thickening of the distal esophagus. GERD with Small Hiatal Hernia (EGD 10/2011) Primary Hyperaldosteronism s/p adrenalectomy: CT angio in 05/2016 1.8 cm enhancing nodule in the right adrenal gland which has increased compared to the previous examination. This was previously thought to represent an adenoma; given interval change in appearance, suggest further evaluation with non emergent MRI (discussed with patient; he currently cannot afford imaging) DJD R >L: X-ray 01/2012: prominent tri-compartment degenerative changes (R); x-ray R knee 05/2016 with mod-severe tricompartmental  OA mildly progressed.  Chronic Back Pain  Compression Fracture Herniation of Nucleus Pulposus Nephrolithiasis/R renal Cyst: CT angio 05/2016: 1.5 cm exophytic cyst arising from medial cortex of the mid right kidney. There is a focal area of hypo enhancement within the anteromedial cortex of the upper pole left kidney which may relate to an area of focal scarring Major Depression, PTSD, Restless Leg Syndrome Enlarged Prostrate: CT angio 05/2016 Coarse prostate gland calcification Hx Seizure x 1 Sleep Apnea    PHYSICAL EXAM: BP 122/70 (BP Location: Left Arm, Patient Position: Sitting, Cuff Size: Normal)   Pulse 98   Temp 98.2 F (36.8 C) (Oral)   Ht 5\' 7"  (1.702 m)   Wt 160 lb 6.4 oz (72.8 kg)   SpO2 98%   BMI 25.12 kg/m  GEN: NAD CV: RRR, no murmurs, rubs, or gallops PULM: CTAB, normal effort ABD: Soft, nontender, nondistended, NABS, no organomegaly SKIN: No rash or cyanosis; warm and well-perfused EXTR: No lower extremity edema or calf tenderness PSYCH: Mood and affect euthymic, normal rate and volume of speech NEURO: Awake, alert, no focal deficits grossly, normal speech  ASSESSMENT/PLAN:  Encounter for chronic pain management Refilled Norco today.  Narcotic database reviewed 09/22/16 and is appropriate.    Restless legs syndrome Agreed to retry Requip. Rx given today. Follow up in 1 month.   Fatigue:  Likely due to stress, anxiety, and difficulty sleeping. He is not willing to start medication for this as he reports of worsening symptoms on different medications in the past.  He is hesitant to see behavioral health. Will obtain CBC, BMP, and TSH. These labs are all normal. Willing to try requip for restless legs (note above) which will hopefully help with his sleep.   Andrew Houseman, MD PGY [place]

## 2016-09-22 ENCOUNTER — Ambulatory Visit (INDEPENDENT_AMBULATORY_CARE_PROVIDER_SITE_OTHER): Payer: Self-pay | Admitting: Internal Medicine

## 2016-09-22 ENCOUNTER — Encounter: Payer: Self-pay | Admitting: Internal Medicine

## 2016-09-22 VITALS — BP 122/70 | HR 98 | Temp 98.2°F | Ht 67.0 in | Wt 160.4 lb

## 2016-09-22 DIAGNOSIS — R5383 Other fatigue: Secondary | ICD-10-CM

## 2016-09-22 DIAGNOSIS — G8929 Other chronic pain: Secondary | ICD-10-CM

## 2016-09-22 DIAGNOSIS — M170 Bilateral primary osteoarthritis of knee: Secondary | ICD-10-CM

## 2016-09-22 DIAGNOSIS — G2581 Restless legs syndrome: Secondary | ICD-10-CM

## 2016-09-22 LAB — CBC
HCT: 42.3 % (ref 38.5–50.0)
Hemoglobin: 14.1 g/dL (ref 13.2–17.1)
MCH: 29.9 pg (ref 27.0–33.0)
MCHC: 33.3 g/dL (ref 32.0–36.0)
MCV: 89.6 fL (ref 80.0–100.0)
MPV: 10.2 fL (ref 7.5–12.5)
PLATELETS: 234 10*3/uL (ref 140–400)
RBC: 4.72 MIL/uL (ref 4.20–5.80)
RDW: 13.4 % (ref 11.0–15.0)
WBC: 10.1 10*3/uL (ref 3.8–10.8)

## 2016-09-22 LAB — BASIC METABOLIC PANEL WITH GFR
BUN: 17 mg/dL (ref 7–25)
CHLORIDE: 104 mmol/L (ref 98–110)
CO2: 28 mmol/L (ref 20–31)
Calcium: 9.4 mg/dL (ref 8.6–10.3)
Creat: 0.85 mg/dL (ref 0.70–1.33)
GFR, Est African American: >89 mL/min (ref >=60)
GFR, Est Non African American: >89 mL/min (ref >=60)
Glucose, Bld: 142 mg/dL — ABNORMAL HIGH (ref 65–99)
POTASSIUM: 4.2 mmol/L (ref 3.5–5.3)
SODIUM: 138 mmol/L (ref 135–146)

## 2016-09-22 LAB — TSH: TSH: 0.83 m[IU]/L (ref 0.40–4.50)

## 2016-09-22 MED ORDER — HYDROCODONE-ACETAMINOPHEN 5-325 MG PO TABS: 1.0000 | ORAL_TABLET | Freq: Three times a day (TID) | ORAL | 0 refills | Status: DC | PRN

## 2016-09-22 MED ORDER — ROPINIROLE HCL 0.25 MG PO TABS: ORAL_TABLET | ORAL | 0 refills | Status: DC

## 2016-09-22 NOTE — Patient Instructions (Signed)
Let's try requip for your restless leg symptoms. You can start with 0.25 mg once daily 1 to 3 hours before bedtime. Dose may be increased after 2 days to 0.5 mg daily, and after 7 days to 1 mg daily.  We will get lab work to evaluate your symptoms of feeling tired. It is likely that this is due to stress/depresion/anxiety  Please follow up in 1 month

## 2016-09-23 NOTE — Assessment & Plan Note (Signed)
Refilled Norco today.  Narcotic database reviewed 09/22/16 and is appropriate.

## 2016-09-23 NOTE — Assessment & Plan Note (Signed)
Agreed to retry Requip. Rx given today. Follow up in 1 month.

## 2016-09-24 ENCOUNTER — Emergency Department (HOSPITAL_COMMUNITY): Payer: Self-pay

## 2016-09-24 ENCOUNTER — Emergency Department (HOSPITAL_COMMUNITY)
Admission: EM | Admit: 2016-09-24 | Discharge: 2016-09-24 | Disposition: A | Discharge status: Home / Self Care | Payer: Self-pay | Attending: Emergency Medicine | Admitting: Emergency Medicine

## 2016-09-24 ENCOUNTER — Encounter (HOSPITAL_COMMUNITY): Payer: Self-pay | Admitting: *Deleted

## 2016-09-24 ENCOUNTER — Other Ambulatory Visit: Payer: Self-pay

## 2016-09-24 DIAGNOSIS — Y9389 Activity, other specified: Secondary | ICD-10-CM | POA: Insufficient documentation

## 2016-09-24 DIAGNOSIS — Z96651 Presence of right artificial knee joint: Secondary | ICD-10-CM | POA: Insufficient documentation

## 2016-09-24 DIAGNOSIS — Z7982 Long term (current) use of aspirin: Secondary | ICD-10-CM | POA: Insufficient documentation

## 2016-09-24 DIAGNOSIS — Y990 Civilian activity done for income or pay: Secondary | ICD-10-CM | POA: Insufficient documentation

## 2016-09-24 DIAGNOSIS — Z79899 Other long term (current) drug therapy: Secondary | ICD-10-CM | POA: Insufficient documentation

## 2016-09-24 DIAGNOSIS — I10 Essential (primary) hypertension: Secondary | ICD-10-CM | POA: Insufficient documentation

## 2016-09-24 DIAGNOSIS — S20212A Contusion of left front wall of thorax, initial encounter: Secondary | ICD-10-CM

## 2016-09-24 DIAGNOSIS — R0789 Other chest pain: Secondary | ICD-10-CM | POA: Insufficient documentation

## 2016-09-24 DIAGNOSIS — Y9289 Other specified places as the place of occurrence of the external cause: Secondary | ICD-10-CM | POA: Insufficient documentation

## 2016-09-24 DIAGNOSIS — E119 Type 2 diabetes mellitus without complications: Secondary | ICD-10-CM | POA: Insufficient documentation

## 2016-09-24 DIAGNOSIS — Z955 Presence of coronary angioplasty implant and graft: Secondary | ICD-10-CM | POA: Insufficient documentation

## 2016-09-24 DIAGNOSIS — I2510 Atherosclerotic heart disease of native coronary artery without angina pectoris: Secondary | ICD-10-CM | POA: Insufficient documentation

## 2016-09-24 HISTORY — DX: Post-traumatic stress disorder, unspecified: F43.10

## 2016-09-24 LAB — I-STAT TROPONIN, ED
Troponin i, poc: 0 ng/mL (ref 0.00–0.08)
Troponin i, poc: 0 ng/mL (ref 0.00–0.08)

## 2016-09-24 LAB — BASIC METABOLIC PANEL
ANION GAP: 10 (ref 5–15)
BUN: 16 mg/dL (ref 6–20)
CHLORIDE: 103 mmol/L (ref 101–111)
CO2: 24 mmol/L (ref 22–32)
Calcium: 9.7 mg/dL (ref 8.9–10.3)
Creatinine, Ser: 0.92 mg/dL (ref 0.61–1.24)
GFR calc Af Amer: >60 mL/min (ref >60)
GFR calc non Af Amer: >60 mL/min (ref >60)
Glucose, Bld: 181 mg/dL — ABNORMAL HIGH (ref 65–99)
POTASSIUM: 3.9 mmol/L (ref 3.5–5.1)
SODIUM: 137 mmol/L (ref 135–145)

## 2016-09-24 LAB — CBC
HEMATOCRIT: 40.8 % (ref 39.0–52.0)
Hemoglobin: 14.4 g/dL (ref 13.0–17.0)
MCH: 30.9 pg (ref 26.0–34.0)
MCHC: 35.3 g/dL (ref 30.0–36.0)
MCV: 87.6 fL (ref 78.0–100.0)
PLATELETS: 234 10*3/uL (ref 150–400)
RBC: 4.66 MIL/uL (ref 4.22–5.81)
RDW: 12.9 % (ref 11.5–15.5)
WBC: 12.3 10*3/uL — ABNORMAL HIGH (ref 4.0–10.5)

## 2016-09-24 NOTE — ED Notes (Signed)
Updated patient on wait time
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
## 2016-09-24 NOTE — ED Notes (Signed)
Pt unable to sign upon discharge due to the computer not working. Pt verbalized understanding of discharge instructions and follow up care. Pt had no further questions.
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
## 2016-09-24 NOTE — Discharge Instructions (Signed)
Take tylenol for pain.   Take your pain meds as prescribed by your doctor.   See your doctor  No heavy lifting for 2 days   Return to ER if you have severe chest pain, trouble breathing.

## 2016-09-26 ENCOUNTER — Ambulatory Visit: Payer: Self-pay
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
## 2016-10-24 ENCOUNTER — Ambulatory Visit (INDEPENDENT_AMBULATORY_CARE_PROVIDER_SITE_OTHER): Payer: Self-pay | Admitting: Internal Medicine

## 2016-10-24 ENCOUNTER — Encounter: Payer: Self-pay | Admitting: Internal Medicine

## 2016-10-24 VITALS — BP 119/80 | HR 89 | Temp 97.8°F | Ht 67.0 in | Wt 162.8 lb

## 2016-10-24 DIAGNOSIS — G894 Chronic pain syndrome: Secondary | ICD-10-CM

## 2016-10-24 DIAGNOSIS — E119 Type 2 diabetes mellitus without complications: Secondary | ICD-10-CM

## 2016-10-24 DIAGNOSIS — G2581 Restless legs syndrome: Secondary | ICD-10-CM

## 2016-10-24 LAB — POCT GLYCOSYLATED HEMOGLOBIN (HGB A1C): HEMOGLOBIN A1C: 6.3

## 2016-10-24 MED ORDER — HYDROCODONE-ACETAMINOPHEN 5-325 MG PO TABS: 1.0000 | ORAL_TABLET | Freq: Three times a day (TID) | ORAL | 0 refills | Status: DC | PRN

## 2016-10-24 NOTE — Patient Instructions (Addendum)
Follow up in 1 month.  - please take Gabapentin and up titrate as we discussed - please call behavioral health - please make an appoitment for [facility] card.

## 2016-11-21 ENCOUNTER — Encounter: Payer: Self-pay | Admitting: Internal Medicine

## 2016-11-21 ENCOUNTER — Ambulatory Visit (INDEPENDENT_AMBULATORY_CARE_PROVIDER_SITE_OTHER): Payer: Self-pay | Admitting: Internal Medicine

## 2016-11-21 VITALS — BP 106/70 | HR 106 | Temp 97.8°F | Wt 160.0 lb

## 2016-11-21 DIAGNOSIS — F329 Major depressive disorder, single episode, unspecified: Secondary | ICD-10-CM

## 2016-11-21 DIAGNOSIS — G894 Chronic pain syndrome: Secondary | ICD-10-CM

## 2016-11-21 DIAGNOSIS — G8929 Other chronic pain: Secondary | ICD-10-CM

## 2016-11-21 DIAGNOSIS — F32A Depression, unspecified: Secondary | ICD-10-CM

## 2016-11-21 MED ORDER — HYDROCODONE-ACETAMINOPHEN 5-325 MG PO TABS: 1.0000 | ORAL_TABLET | Freq: Three times a day (TID) | ORAL | 0 refills | Status: DC | PRN
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
## 2016-11-25 NOTE — Assessment & Plan Note (Signed)
Per patient, not tolerating Gabapentin. 1 month refill given for Norco.

## 2016-11-25 NOTE — Assessment & Plan Note (Signed)
Declines pharmacologic therapy. Willing to see behavioral health; contact information given.

## 2016-12-12 ENCOUNTER — Encounter: Payer: Self-pay | Admitting: Internal Medicine

## 2016-12-12 ENCOUNTER — Ambulatory Visit (INDEPENDENT_AMBULATORY_CARE_PROVIDER_SITE_OTHER): Payer: Self-pay | Admitting: Internal Medicine

## 2016-12-12 VITALS — BP 126/78 | HR 106 | Temp 97.9°F | Wt 161.0 lb

## 2016-12-12 DIAGNOSIS — IMO0002 Reserved for concepts with insufficient information to code with codable children: Secondary | ICD-10-CM

## 2016-12-12 DIAGNOSIS — M25562 Pain in left knee: Secondary | ICD-10-CM

## 2016-12-12 DIAGNOSIS — M1710 Unilateral primary osteoarthritis, unspecified knee: Secondary | ICD-10-CM

## 2016-12-12 MED ORDER — KETOROLAC TROMETHAMINE 60 MG/2ML IM SOLN
60.0000 mg | Freq: Once | INTRAMUSCULAR | Status: AC
  Administered 2016-12-12: 60 mg via INTRAMUSCULAR

## 2016-12-12 NOTE — Assessment & Plan Note (Signed)
Known DJD of knees shown on xray. Likely acute worsening of DJD on L knee. Inciting event uncertain. As patient is already on Norco TID PRN, will not prescribe additional medication today. Offered patient Toradol shot in office, which he would like. Also offered to place sports medicine referral, however patient says he does not have insurance and thus cannot afford this.  - Toradol in office today - Continue home Norco - Continue heat and ice - Return precautions discussed

## 2016-12-12 NOTE — Progress Notes (Signed)
   Subjective:   Patient: Andrew Huerta       Birthdate: [birth year]       MRN: [id number]      HPI  Andrew Huerta is a 51 y.o. male presenting for same day appointment for knee pain.   L knee pain Mr. Andrew Huerta reports worsening L knee pain for the past four days. Reporting popping and cracking in his L knee. Has a history of chronic knee pain with DJD but says it has been worse for the last four days. No known trauma. Pain is present even at rest, but does improve with rest. Worse with movement. Norco helps. Ice and heat also help. Is able to walk but says his "waddle" is worse than usual. Denies numbness or weakness. Thinks he has had some swelling over his patellar tendon. Denies redness. Says he had surgery on his L knee about 15-20 years ago after he hit his knee on the floor. Says he thinks it was a clean out. Typically has more issues with his R knee than the L. R knee stable.   Of note, patient is on chronic narcotics (Norco 5-325mg  q8 PRN) for knee pain and back pain. He is seen by Dr. Dallas Schimke monthly due to benzos found in UDS in September 2017 when benzos were not prescribed to him. He was last seen by her two weeks ago and prescribed 100 tablets of Norco at that time. Pain contract has been signed and narcotics database was reviewed at last visit with no red flags noted.   Smoking status reviewed. Patient is never smoker.   Review of Systems See HPI.     Objective:  Physical Exam  Constitutional: He is oriented to person, place, and time and well-developed, well-nourished, and in no distress.  HENT:  Head: Normocephalic and atraumatic.  Eyes: Conjunctivae and EOM are normal. Right eye exhibits no discharge. Left eye exhibits no discharge.  Pulmonary/Chest: Effort normal. No respiratory distress.  Musculoskeletal:  Popping felt with extension of knee. No redness or swelling noted. 5/5 strength lower extremities bilaterally. Able to walk without issues.   Neurological: He is  alert and oriented to person, place, and time.  Skin: Skin is warm and dry.  Psychiatric: Affect and judgment normal.      Assessment & Plan:  Osteoarthrosis involving lower leg Known DJD of knees shown on xray. Likely acute worsening of DJD on L knee. Inciting event uncertain. As patient is already on Norco TID PRN, will not prescribe additional medication today. Offered patient Toradol shot in office, which he would like. Also offered to place sports medicine referral, however patient says he does not have insurance and thus cannot afford this.  - Toradol in office today - Continue home [place] and ice - Return precautions discussed   Adin Hector, MD, MPH PGY-2 [facility] Pager [phone]

## 2016-12-12 NOTE — Patient Instructions (Addendum)
It was nice meeting you today Mr. Andrew Huerta!  Today you have received a shot of Toradol, an anti-inflammatory medication. This should give you some pain relief over the next few days. If your pain continues and is not relieved by your normal medications, heat, and ice, please let us know. We can place a sports medicine referral for you if you'd like, but I understand that cost is an issue.   If you have any weakness or numbness in your leg, please let us know.   If you have any questions or concerns, please feel free to call the clinic.   Be well,  Dr. Avon Gully

## 2016-12-19 ENCOUNTER — Telehealth: Payer: Self-pay | Admitting: Internal Medicine

## 2016-12-19 DIAGNOSIS — G894 Chronic pain syndrome: Secondary | ICD-10-CM

## 2016-12-19 MED ORDER — HYDROCODONE-ACETAMINOPHEN 5-325 MG PO TABS: 1.0000 | ORAL_TABLET | Freq: Three times a day (TID) | ORAL | 0 refills | Status: DC | PRN

## 2016-12-19 NOTE — Telephone Encounter (Signed)
Rx printed and Dr. Gwenlyn Saran and [facility] contact information placed at front desk for pick up. Please inform patient.

## 2016-12-19 NOTE — Telephone Encounter (Signed)
Needs refill on vico dan.  He also would like the business card for [employer].  Please call when ready for pickup

## 2016-12-22 NOTE — Telephone Encounter (Signed)
Patient is aware. Quenesha Douglass,CMA  

## 2017-01-03 ENCOUNTER — Emergency Department (HOSPITAL_COMMUNITY)
Admission: EM | Admit: 2017-01-03 | Discharge: 2017-01-04 | Disposition: A | Discharge status: Home / Self Care | Payer: Self-pay | Attending: Emergency Medicine | Admitting: Emergency Medicine

## 2017-01-03 ENCOUNTER — Other Ambulatory Visit: Payer: Self-pay

## 2017-01-03 ENCOUNTER — Emergency Department (HOSPITAL_COMMUNITY): Payer: Self-pay

## 2017-01-03 ENCOUNTER — Encounter (HOSPITAL_COMMUNITY): Payer: Self-pay | Admitting: Emergency Medicine

## 2017-01-03 DIAGNOSIS — Z7982 Long term (current) use of aspirin: Secondary | ICD-10-CM | POA: Insufficient documentation

## 2017-01-03 DIAGNOSIS — R079 Chest pain, unspecified: Secondary | ICD-10-CM | POA: Insufficient documentation

## 2017-01-03 DIAGNOSIS — Z79899 Other long term (current) drug therapy: Secondary | ICD-10-CM | POA: Insufficient documentation

## 2017-01-03 DIAGNOSIS — E119 Type 2 diabetes mellitus without complications: Secondary | ICD-10-CM | POA: Insufficient documentation

## 2017-01-03 DIAGNOSIS — I2510 Atherosclerotic heart disease of native coronary artery without angina pectoris: Secondary | ICD-10-CM | POA: Insufficient documentation

## 2017-01-03 LAB — CBC
HEMATOCRIT: 39 % (ref 39.0–52.0)
HEMOGLOBIN: 13.2 g/dL (ref 13.0–17.0)
MCH: 30.2 pg (ref 26.0–34.0)
MCHC: 33.8 g/dL (ref 30.0–36.0)
MCV: 89.2 fL (ref 78.0–100.0)
Platelets: 207 10*3/uL (ref 150–400)
RBC: 4.37 MIL/uL (ref 4.22–5.81)
RDW: 13.3 % (ref 11.5–15.5)
WBC: 10.3 10*3/uL (ref 4.0–10.5)

## 2017-01-03 LAB — BASIC METABOLIC PANEL
ANION GAP: 9 (ref 5–15)
BUN: 19 mg/dL (ref 6–20)
CO2: 25 mmol/L (ref 22–32)
Calcium: 9.1 mg/dL (ref 8.9–10.3)
Chloride: 104 mmol/L (ref 101–111)
Creatinine, Ser: 0.91 mg/dL (ref 0.61–1.24)
GLUCOSE: 130 mg/dL — AB (ref 65–99)
POTASSIUM: 3.9 mmol/L (ref 3.5–5.1)
Sodium: 138 mmol/L (ref 135–145)

## 2017-01-03 MED ORDER — NITROGLYCERIN 0.4 MG SL SUBL: 0.4000 mg | SUBLINGUAL_TABLET | SUBLINGUAL | Status: DC | PRN

## 2017-01-03 MED ORDER — MORPHINE SULFATE (PF) 4 MG/ML IV SOLN
4.0000 mg | Freq: Once | INTRAVENOUS | Status: AC
  Administered 2017-01-03: 4 mg via INTRAVENOUS
  Filled 2017-01-03: qty 1

## 2017-01-03 MED ORDER — ONDANSETRON HCL 4 MG/2ML IJ SOLN
4.0000 mg | Freq: Once | INTRAMUSCULAR | Status: AC
  Administered 2017-01-03: 4 mg via INTRAVENOUS
  Filled 2017-01-03: qty 2

## 2017-01-03 NOTE — ED Provider Notes (Signed)
[facility] DEPT Provider Note   CSN: [id number] Arrival date & time: 01/03/17  2132     History   Chief Complaint Chief Complaint  Patient presents with  . Chest Pain    HPI Andrew Huerta is a 51 y.o. male.  Patient with PMH of HTN, HL, prior MI presents to the ED with a chief complaint CP.  He states that this episode of  pain started at 8:30 PM tonight.  He reports associated intermittent episodes of pain for the past week.  He states that this episode tonight radiates to his left neck and shoulder.  He complains of mild SOB.  He reports that he took some aspirin prior to arrival which helped some.  He denies any radiating pain to his back.  He denies any new numbness or weakness.   The history is provided by the patient. No language interpreter was used.    Past Medical History:  Diagnosis Date  . Adrenal tumor    a. s/p adrenal gland resection at [facility]. left side removal per patient  . Anxiety   . Barrett's esophagus    EGD - 11/27/09 - short segment of Barrett's  . CAD (coronary artery disease)    a. 04/27/14 [place] s/p DES to mLAD and DES to mLCx  . Chronic back pain    upper and lower per patient  . Chronic chest pain   . Degenerative joint disease    Bilateral knees. Significant knee pain since playing football in high school. also in back per patient  . Depression   . Diabetes mellitus type 2, controlled ([place]) 08/29/2015   Diagnosed by A1c 7.6% during 08/26/15 hospital admission for chest pain  . GERD (gastroesophageal reflux disease)   . Hiatal hernia    EGD - 11/27/2009  . Hyperlipidemia   . Hypertension   . Low back pain   . Primary hyperaldosteronism ([place]) 01/22/2012   S/P adrenalectomy     . PTSD (post-traumatic stress disorder)   . Sleep apnea   . Stone, kidney     Patient Active Problem List   Diagnosis Date Noted  . Renal cyst, right 11/12/2015  . Diabetes mellitus type 2, controlled ([place]) 08/29/2015  . Chest pain 08/25/2015  . Chronic back  pain 04/27/2015  . Left forearm pain 04/27/2015  . Chest pain with moderate risk of acute coronary syndrome   . New onset seizure ([employer]) 09/29/2014  . Neck pain 09/25/2014  . Encounter for chronic pain management 08/09/2014  . CAD- 2 V PCI Aug 2015   . Hyperlipidemia   . Nephrolithiasis 10/12/2013  . Primary hyperaldosteronism ([place]) 09/13/2013  . Major depression ([place]) 07/26/2013  . Restless legs syndrome 01/05/2013  . PTSD (post-traumatic stress disorder) 05/07/2012  . Back pain, thoracic 04/14/2012  . Compression fracture 02/05/2012  . Gastroesophageal reflux disease with hiatal hernia 02/05/2012  . Hypercholesterolemia 02/05/2012  . Herniation of nucleus pulposus 02/05/2012  . Apnea, sleep 02/05/2012  . Essential (primary) hypertension 02/05/2012  . Clinical depression 02/05/2012  . Headache(784.0) 11/14/2011  . Atypical chest pain 08/14/2011  . Enlarged prostate 03/26/2011  . Osteoarthrosis involving lower leg 01/08/2010  . Essential hypertension 05/02/2008  . Barrett's esophagus 10/29/2006    Past Surgical History:  Procedure Laterality Date  . ADRENALECTOMY  10/2013  . CARDIAC CATHETERIZATION  05/01/2014   Patent stents, other disease unchanged  . CARDIAC CATHETERIZATION  04/27/2014   Procedure: CORONARY STENT INTERVENTION;  Surgeon: Peter M Martinique, MD;  Location: [facility] CATH  LAB;  Service: Cardiovascular;;  DES mid Cx  DES mid LAD  . CARDIAC CATHETERIZATION N/A 03/22/2015   Procedure: Left Heart Cath and Coronary Angiography;  Surgeon: Sherren Mocha, MD;  Location: [facility] CV LAB;  Service: Cardiovascular;  Laterality: N/A;  . CORONARY ANGIOPLASTY WITH STENT PLACEMENT  04/27/2014   3.0 x 16 mm Promus DES to the mid LAD and 3.5 x 28 mm Promus to the mid LCx, otherwise 20-30 percent lesions, EF 55%  . KIDNEY STONE SURGERY  X 1  . KNEE ARTHROPLASTY Right 1984  . KNEE ARTHROSCOPY Right X 6  . LEFT HEART CATHETERIZATION WITH CORONARY ANGIOGRAM N/A 04/27/2014   Procedure:  LEFT HEART CATHETERIZATION WITH CORONARY ANGIOGRAM;  Surgeon: Peter M Martinique, MD;  Location: [facility] CATH LAB;  Service: Cardiovascular;  Laterality: N/A;  . LEFT HEART CATHETERIZATION WITH CORONARY ANGIOGRAM N/A 05/01/2014   Procedure: LEFT HEART CATHETERIZATION WITH CORONARY ANGIOGRAM;  Surgeon: Burnell Blanks, MD;  Location: [facility] CATH LAB;  Service: Cardiovascular;  Laterality: N/A;  . LITHOTRIPSY  X 2       Home Medications    Prior to Admission medications   Medication Sig Start Date End Date Taking? Authorizing Provider  aspirin EC 81 MG tablet Take 81 mg by mouth daily.   Yes [provider]  atorvastatin (LIPITOR) 80 MG tablet Take 1 tablet (80 mg total) by mouth daily. 05/30/16   Smiley Houseman, MD  gabapentin (NEURONTIN) 100 MG capsule Take 1 capsule (100 mg total) by mouth at bedtime. Patient not taking: Reported on 09/24/2016 06/30/16   Smiley Houseman, MD  HYDROcodone-acetaminophen (NORCO/VICODIN) 5-325 MG tablet Take 1-2 tablets by mouth every 8 (eight) hours as needed for moderate pain. 12/19/16   Smiley Houseman, MD  metoprolol tartrate (LOPRESSOR) 25 MG tablet Take 0.5 tablets (12.5 mg total) by mouth 2 (two) times daily. 05/30/16   Smiley Houseman, MD  omeprazole (PRILOSEC) 20 MG capsule Take 1 capsule (20 mg total) by mouth daily. 05/13/16   Recardo Evangelist, PA-C    Family History Family History  Problem Relation Age of Onset  . Cancer Father     stomach cancer  . Diabetes Father   . Heart attack Paternal Grandfather   . Diabetes Paternal Grandmother     Social History Social History  Substance Use Topics  . Smoking status: Never Smoker  . Smokeless tobacco: Never Used  . Alcohol use 0.0 oz/week     Comment: "seldom" per patient     Allergies   Cortisone acetate; Darvocet [propoxyphene n-acetaminophen]; Nsaids; Xanax [alprazolam]; and Tape   Review of Systems Review of Systems  All other systems reviewed and are  negative.    Physical Exam Updated Vital Signs BP 128/89 (BP Location: Left Arm)   Pulse 76   Temp 98.2 F (36.8 C) (Oral)   Resp 15   Ht 5\' 7"  (1.702 m)   Wt 72.6 kg   SpO2 97%   BMI 25.06 kg/m   Physical Exam  Constitutional: He is oriented to person, place, and time. He appears well-developed and well-nourished.  HENT:  Head: Normocephalic and atraumatic.  Eyes: Conjunctivae and EOM are normal. Pupils are equal, round, and reactive to light. Right eye exhibits no discharge. Left eye exhibits no discharge. No scleral icterus.  Neck: Normal range of motion. Neck supple. No JVD present.  Cardiovascular: Normal rate, regular rhythm and normal heart sounds.  Exam reveals no gallop and no friction rub.  No murmur heard. Pulmonary/Chest: Effort normal and breath sounds normal. No respiratory distress. He has no wheezes. He has no rales. He exhibits no tenderness.  Abdominal: Soft. He exhibits no distension and no mass. There is no tenderness. There is no rebound and no guarding.  Musculoskeletal: Normal range of motion. He exhibits no edema or tenderness.  Neurological: He is alert and oriented to person, place, and time.  Skin: Skin is warm and dry.  Psychiatric: He has a normal mood and affect. His behavior is normal. Judgment and thought content normal.  Nursing note and vitals reviewed.    ED Treatments / Results  Labs (all labs ordered are listed, but only abnormal results are displayed) Labs Reviewed  BASIC METABOLIC PANEL - Abnormal; Notable for the following:       Result Value   Glucose, Bld 130 (*)    All other components within normal limits  CBC  I-STAT TROPOININ, ED  I-STAT TROPOININ, ED    EKG  EKG Interpretation None       Radiology Dg Chest 2 View  Result Date: 01/03/2017 CLINICAL DATA:  Central chest pain radiating to neck and back. Vomiting. History of hypertension, diabetes. EXAM: CHEST  2 VIEW COMPARISON:  Chest radiograph September 24, 2016  FINDINGS: Cardiomediastinal silhouette is normal. Coronary artery stent. No pleural effusions or focal consolidations. Trachea projects midline and there is no pneumothorax. Soft tissue planes and included osseous structures are non-suspicious. IMPRESSION: Stable examination:  No acute cardiopulmonary process. Electronically Signed   By: Elon Alas M.D.   On: 01/03/2017 23:36    Procedures Procedures (including critical care time)  Medications Ordered in ED Medications - No data to display   Initial Impression / Assessment and Plan / ED Course  I have reviewed the triage vital signs and the nursing notes.  Pertinent labs & imaging results that were available during my care of the patient were reviewed by me and considered in my medical decision making (see chart for details).     Patient intermittent chest pain for the past week.  Worse episode tonight.  Will check labs and reassess.  Laboratory workup is reassuring. Delta troponin is negative. Repeat EKG is normal without any ischemic changes. I have low suspicion for ACS, patient does self-report a history of chronic chest pain. I have low suspicion for PE, he is PERC negative. Patient is chest pain-free. He agrees to follow-up with his primary care provider. He is stable and ready for discharge.  Final Clinical Impressions(s) / ED Diagnoses   Final diagnoses:  Nonspecific chest pain    New Prescriptions New Prescriptions   No medications on file     Montine Circle, Hershal Coria 01/04/17 0252    Elnora Morrison, MD 01/05/17 [phone]
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
## 2017-01-04 LAB — I-STAT TROPONIN, ED: Troponin i, poc: 0 ng/mL (ref 0.00–0.08)

## 2017-01-04 NOTE — ED Provider Notes (Signed)
ED ECG REPORT   Date: 01/03/2017 2137  Rate: 86  Rhythm: normal sinus rhythm  QRS Axis: normal  Intervals: normal  ST/T Wave abnormalities: normal  Conduction Disutrbances:none   I have personally reviewed the EKG tracing and agree with the computerized printout as noted.    Ripley Fraise, MD 01/04/17 Ovid Curd

## 2017-01-05 LAB — I-STAT TROPONIN, ED: Troponin i, poc: 0 ng/mL (ref 0.00–0.08)

## 2017-01-19 ENCOUNTER — Other Ambulatory Visit: Payer: Self-pay | Admitting: Internal Medicine

## 2017-01-19 DIAGNOSIS — G894 Chronic pain syndrome: Secondary | ICD-10-CM

## 2017-01-19 MED ORDER — HYDROCODONE-ACETAMINOPHEN 5-325 MG PO TABS: 1.0000 | ORAL_TABLET | Freq: Three times a day (TID) | ORAL | 0 refills | Status: DC | PRN

## 2017-01-19 NOTE — Telephone Encounter (Signed)
Pt calling to request refill of:  Name of Medication(s):  hydrocodone Last date of OV:   Pharmacy:  12-12-16  Will route refill request to Clinic RN.  Discussed with patient policy to call pharmacy for future refills.  Also, discussed refills may take up to 48 hours to approve or deny.  Renella Cunas

## 2017-01-19 NOTE — Telephone Encounter (Signed)
Printed Rx and placed at front desk for pick up. Please inform patient.

## 2017-01-21 NOTE — Telephone Encounter (Signed)
Rx from the printer is now signed and placed at the front desk for pick up. Please inform patient.   Sorry I thought I placed it up front on Monday

## 2017-02-23 ENCOUNTER — Telehealth: Payer: Self-pay | Admitting: *Deleted

## 2017-02-23 ENCOUNTER — Other Ambulatory Visit: Payer: Self-pay | Admitting: Internal Medicine

## 2017-02-23 DIAGNOSIS — G894 Chronic pain syndrome: Secondary | ICD-10-CM

## 2017-02-23 MED ORDER — HYDROCODONE-ACETAMINOPHEN 5-325 MG PO TABS: 1.0000 | ORAL_TABLET | Freq: Three times a day (TID) | ORAL | 0 refills | Status: DC | PRN

## 2017-02-23 NOTE — Telephone Encounter (Signed)
-----   Message from Smiley Houseman, MD sent at 02/23/2017  1:25 PM EDT ----- Please inform patient that Rx is ready for pick up.

## 2017-02-23 NOTE — Telephone Encounter (Signed)
LM for patient that script is ready for pick up. Jazmin Hartsell,CMA  

## 2017-02-23 NOTE — Telephone Encounter (Signed)
Pt needs a refill on Vicodin. Pt is out. ep

## 2017-03-23 ENCOUNTER — Ambulatory Visit: Payer: Self-pay | Admitting: Internal Medicine

## 2017-03-23 ENCOUNTER — Encounter: Payer: Self-pay | Admitting: Internal Medicine

## 2017-03-23 ENCOUNTER — Ambulatory Visit (INDEPENDENT_AMBULATORY_CARE_PROVIDER_SITE_OTHER): Payer: Self-pay | Admitting: Internal Medicine

## 2017-03-23 VITALS — BP 114/72 | HR 77 | Temp 97.8°F | Ht 67.0 in | Wt 164.4 lb

## 2017-03-23 DIAGNOSIS — G894 Chronic pain syndrome: Secondary | ICD-10-CM

## 2017-03-23 DIAGNOSIS — G8929 Other chronic pain: Secondary | ICD-10-CM

## 2017-03-23 DIAGNOSIS — D229 Melanocytic nevi, unspecified: Secondary | ICD-10-CM

## 2017-03-23 MED ORDER — HYDROCODONE-ACETAMINOPHEN 5-325 MG PO TABS: 1.0000 | ORAL_TABLET | Freq: Three times a day (TID) | ORAL | 0 refills | Status: DC | PRN

## 2017-03-23 NOTE — Progress Notes (Signed)
[facility] Phone: [phone]   Date of Visit: 03/23/2017   HPI:  Chronic Pain/OA of bilateral Knees: - patient has severe bilateral knee OA. Currently cannot have surgery as he does not have insurance.  - has tried NSAIDs and knee injections in the past without much improvement - reports Gabapentin makes him drowsy during the day and make him moved while he is sleeping at night. Has also tried cymbalta in the past which makes him dowsy - his pain makes it difficult for him to do daily tasks and responsibilities - he is currently needing 100 tab of Norco per month to control his pain.  - controlled substance database reviewed and is appropriate. I did reiterate that he needs to use one pharmacy for refills.  - his pain is stable but he reports that it seems like his right knee is giving out at times. He bough a knee sleeve which helps with this. No recent falls.   Moles:  - he has had two moles near his penis for years but in the recent few months he has noticed it has gotten bigger.  - no drainage or bleeding from the area. No itching   ROS: See HPI.  [place]:  PMH: HTN, CAD s/p stent to mLAD and mLCx 04/2014 with [place] in 03/2015 with mild non-obstructive CAD DM2, HLD Barrett's Esophagus: CT angio chest in 05/2016 with diffuse wall thickening of the esophagus with right asymmetrical thickening of the distal esophagus. GERD with Small Hiatal Hernia (EGD 10/2011) Primary Hyperaldosteronism s/p adrenalectomy: CT angio in 05/2016 1.8 cm enhancing nodule in the right adrenal gland which has increased compared to the previous examination. 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  Chronic Back Pain  Hx Compression Fracture Herniation of Nucleus Pulposus Nephrolithiasis/R renal Cyst: CT angio 05/2016: 1.5 cm exophytic cyst arising from medial cortex of the mid right kidney. There is a focal area of hypo enhancement within the anteromedial cortex of the upper pole left kidney which may relate to an area of focal scarring Major Depression, PTSD, Restless Leg Syndrome Enlarged Prostrate: CT angio 05/2016 Coarse prostate gland calcification Hx Seizure x 1 Sleep Apnea   PHYSICAL EXAM: BP 114/72   Pulse 77   Temp 97.8 F (36.6 C) (Oral)   Ht 5\' 7"  (1.702 m)   Wt 164 lb 6.4 oz (74.6 kg)   SpO2 98%   BMI 25.75 kg/m  GEN: NAD CV: RRR, no murmurs, rubs, or gallops PULM: CTAB, normal effort MSK:  Right Knee:  No effusion or erythema Tenderness to palpation of the medial and lateral joint lines and patellar tendon.  He is unable to fully extend his right leg. Otherwise range of motion is normal. Ligaments are intact  Normal strength of knee extension and flexion  Left Knee:  No effusion or erythema Tenderness to palpation of the lateral joint line; otherwise normal Normal range of motion Ligaments are intact  Normal strength of knee extension and flexion  SKIN:  Mole on the left superior lateral aspect from the base of the penis that is 1.1x1cm. It is raised slightly and hyperpigmented (but pigmentation is not irregular). nontender to palpation.  Mole on the right superior lateral from the base of the  penis that is 0.5 x 0.8 this is significantly raised but also has a raised but flat area. It is hyperpigmented as well. nontender to palpation  EXTR: No lower extremity edema or calf tenderness PSYCH: Mood and affect euthymic, normal rate and volume of speech NEURO: Awake, alert, no focal deficits grossly, normal speech   ASSESSMENT/PLAN:  Encounter for chronic pain management UDS today. Refilled Norco today #100. Pain contract renewed  today. Database reviewed today.  Change in Skin Moles: As described above. Could be benign nevi, but patient reports of these moles getting bigger. The right mole appears more concerning than left. Will refer to derm clinic for shave biopsy.   Smiley Houseman, MD PGY [place]

## 2017-03-23 NOTE — Assessment & Plan Note (Signed)
UDS today. Refilled Norco today #100. Pain contract renewed today. Database reviewed today.

## 2017-03-23 NOTE — Patient Instructions (Signed)
Please make a appointment at [facility] at our building for a shave biopsy of the right mole.

## 2017-03-29 LAB — TOXASSURE SELECT 13 (MW), URINE

## 2017-03-30 ENCOUNTER — Ambulatory Visit: Payer: Self-pay | Admitting: Internal Medicine

## 2017-04-20 ENCOUNTER — Ambulatory Visit (INDEPENDENT_AMBULATORY_CARE_PROVIDER_SITE_OTHER): Payer: Self-pay | Admitting: Internal Medicine

## 2017-04-20 ENCOUNTER — Encounter: Payer: Self-pay | Admitting: Internal Medicine

## 2017-04-20 VITALS — BP 118/60 | HR 64 | Temp 98.0°F | Ht 67.0 in | Wt 164.0 lb

## 2017-04-20 DIAGNOSIS — G894 Chronic pain syndrome: Secondary | ICD-10-CM

## 2017-04-20 DIAGNOSIS — M75102 Unspecified rotator cuff tear or rupture of left shoulder, not specified as traumatic: Secondary | ICD-10-CM

## 2017-04-20 MED ORDER — HYDROCODONE-ACETAMINOPHEN 5-325 MG PO TABS: 1.0000 | ORAL_TABLET | Freq: Three times a day (TID) | ORAL | 0 refills | Status: DC | PRN

## 2017-04-20 NOTE — Patient Instructions (Signed)

## 2017-04-20 NOTE — Progress Notes (Signed)
[facility] Phone: [phone]   Date of Visit: 04/20/2017   HPI:  Left shoulder pain: - Reports that he was walking down the stairs and his right knee gave out so he held onto the railing with his left arm and broke his fall. At that time he had instant pain at his shoulder. He did not fall and he did not hit his shoulder against anything.Denies any bruising or swelling of the shoulder. This occurred Friday, August 17. - He continues to have left shoulder pain with some left elbow pain as well. Symptoms have been stable and not worsening. - He has pain with range of motion. Denies any weakness, numbness or tingling  ROS: See HPI.  [place]:  PMH: HTN, CAD s/p stent to mLAD and mLCx 04/2014 with [place] in 03/2015 with mild non-obstructive CAD DM2, HLD Barrett's Esophagus: CT angio chest in 05/2016 with diffuse wall thickening of the esophagus with right asymmetrical thickening of the distal esophagus. GERD with Small Hiatal Hernia (EGD 10/2011) Primary Hyperaldosteronism s/p adrenalectomy: CT angio in 05/2016 1.8 cm enhancing nodule in the right adrenal gland which has increased compared to the previous examination. 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  Chronic Back Pain  Hx Compression Fracture Herniation of Nucleus Pulposus Nephrolithiasis/R renal Cyst: CT angio 05/2016: 1.5 cm exophytic cyst arising from medial cortex of the mid right kidney. There is a focal area of hypo enhancement within the anteromedial cortex of the upper pole left kidney which may relate to an area of focal scarring Major Depression, PTSD, Restless Leg Syndrome Enlarged Prostrate: CT  angio 05/2016 Coarse prostate gland calcification Hx Seizure x 1 Sleep Apnea   PHYSICAL EXAM: BP 118/60   Pulse 64   Temp 98 F (36.7 C) (Oral)   Ht 5\' 7"  (1.702 m)   Wt 164 lb (74.4 kg)   SpO2 98%   BMI 25.69 kg/m  GEN: NAD MSK: No asymmetry noted with shoulders. No tenderness to palpation of clavicle or before AC joint or scapula. Active range of motion is mildly reduced to approximately 150 degrees with flexion and abduction of the left shoulder. However I'm able to passively move shoulder to full range of motion the patient does have pain. Pain with active internal rotation which is slightly limited due to the pain. Normal strength of the shoulder although patient does have pain. Neer sign is positive. Hawkins sign negative. Empty can test positive for pain but negative for weakness. No weakness of external or internal rotation against resistance. Normal sensation to light touch in the upper extremity. Normal radial and ulnar pulses. PSYCH: Mood and affect euthymic, normal rate and volume of speech NEURO: Awake, alert, no focal deficits grossly, normal speech  ASSESSMENT/PLAN:  1. Rotator cuff syndrome of left shoulder Symptoms consistent with inflammation of the rotator cuff. Not consistent with a rotator cuff tear as he does not have weakness on exam. - recommended ice to the joint, NSAIDs but caution with gastric irritation, and provided range of motion exercises to prevent frozen shoulder  2. Chronic pain syndrome Reviewed controlled substances database and is appropriate. Will refill Norco - HYDROcodone-acetaminophen (NORCO/VICODIN) 5-325 MG tablet; Take 1-2 tablets by mouth every 8 (eight) hours as needed for moderate  pain.  Dispense: 100 tablet; Refill: 0   Smiley Houseman, MD PGY 3 .[place]

## 2017-05-01 ENCOUNTER — Encounter: Payer: Self-pay | Admitting: Internal Medicine

## 2017-05-01 ENCOUNTER — Ambulatory Visit (INDEPENDENT_AMBULATORY_CARE_PROVIDER_SITE_OTHER): Payer: Self-pay | Admitting: Internal Medicine

## 2017-05-01 VITALS — BP 120/60 | HR 89 | Temp 98.1°F | Ht 67.0 in | Wt 164.0 lb

## 2017-05-01 DIAGNOSIS — M5442 Lumbago with sciatica, left side: Secondary | ICD-10-CM

## 2017-05-01 MED ORDER — PREDNISONE 10 MG PO TABS: ORAL_TABLET | ORAL | 0 refills | Status: DC

## 2017-05-01 NOTE — Patient Instructions (Signed)
We will try a steroid taper for your symptoms.

## 2017-05-01 NOTE — Progress Notes (Signed)
   [facility] Phone: [phone]   Date of Visit: 05/01/2017   HPI:  Back Pain: - chronic back pain with acute flare over the past week - reports of left sided low back pain with radiation to the right side and down his left leg for the past week - it hurts to bend down  - no issues with weakness, saddle anesthesia, urinary retention/incontinence, bowel incontinence  - is taking his chronic pain medication but not controlling pain. Also doing icey hot and heating pad  - no injury or history of overuse to his knowledge  - has tried gabapentin in the past but could not tolerate. Allergy to NSAID  ROS: See HPI.  [place]:  PMH: HTN, CAD s/p stent to mLAD and mLCx 04/2014 with [place] in 03/2015 with mild non-obstructive CAD DM2, HLD Barrett's Esophagus: CT angio chest in 05/2016 with diffuse wall thickening of the esophagus with right asymmetrical thickening of the distal esophagus. GERD with Small Hiatal Hernia (EGD 10/2011) Primary Hyperaldosteronism s/p adrenalectomy: CT angio in 05/2016 1.8 cm enhancing nodule in the right adrenal gland which has increased compared to the previous examination. 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  Chronic Back Pain  Hx Compression Fracture Herniation of Nucleus Pulposus Nephrolithiasis/R renal Cyst: CT angio 05/2016: 1.5 cm exophytic cyst arising from medial cortex of the mid right kidney. There is a focal area of hypo enhancement within the anteromedial cortex of the upper pole left kidney which may relate to an area of focal scarring Major Depression, PTSD, Restless Leg Syndrome Enlarged Prostrate: CT angio 05/2016 Coarse  prostate gland calcification Hx Seizure x 1 Sleep Apnea   PHYSICAL EXAM: BP 120/60   Pulse 89   Temp 98.1 F (36.7 C) (Oral)   Ht 5\' 7"  (1.702 m)   Wt 164 lb (74.4 kg)   SpO2 96%   BMI 25.69 kg/m  GEN: NAD  CV: RRR, no murmurs, rubs, or gallops PULM: CTAB, normal effort MSK: tenderness to palpation of the sacral region mainly on the left SI joint area. Straight leg raise produces pain in the back and pain radiates down in the right anterior leg. Normal strength bilaterally. Normal sensation to light touch. Normal patellar reflexes.  EXTR: No lower extremity edema or calf tenderness PSYCH: Mood and affect euthymic, normal rate and volume of speech NEURO: Awake, alert, no focal deficits grossly, normal speech   ASSESSMENT/PLAN:  1. Acute left-sided low back pain with left-sided sciatica Pain over the left SI joint. Straight leg raise does not cause the classic radiation of pain to the lower extremity. He is unable to take NSAIDs. Will try steroid taper (has tolerated oral steroids in the past).  - Prednisone 50mg  x 1 day, then 40mg  x 1 day, then 30mg , 20mg , and 10mg    Smiley Houseman, MD PGY [place]

## 2017-05-18 ENCOUNTER — Other Ambulatory Visit: Payer: Self-pay | Admitting: *Deleted

## 2017-05-18 DIAGNOSIS — G894 Chronic pain syndrome: Secondary | ICD-10-CM

## 2017-05-20 ENCOUNTER — Ambulatory Visit (INDEPENDENT_AMBULATORY_CARE_PROVIDER_SITE_OTHER): Payer: Self-pay | Admitting: Family Medicine

## 2017-05-20 VITALS — BP 110/70 | HR 67 | Temp 98.4°F | Ht 67.0 in | Wt 165.0 lb

## 2017-05-20 DIAGNOSIS — D229 Melanocytic nevi, unspecified: Secondary | ICD-10-CM

## 2017-05-20 DIAGNOSIS — L918 Other hypertrophic disorders of the skin: Secondary | ICD-10-CM

## 2017-05-20 DIAGNOSIS — I781 Nevus, non-neoplastic: Secondary | ICD-10-CM | POA: Insufficient documentation

## 2017-05-20 DIAGNOSIS — L819 Disorder of pigmentation, unspecified: Secondary | ICD-10-CM

## 2017-05-20 DIAGNOSIS — L821 Other seborrheic keratosis: Secondary | ICD-10-CM

## 2017-05-20 MED ORDER — HYDROCODONE-ACETAMINOPHEN 5-325 MG PO TABS: 1.0000 | ORAL_TABLET | Freq: Three times a day (TID) | ORAL | 0 refills | Status: DC | PRN

## 2017-05-20 NOTE — Assessment & Plan Note (Addendum)
Area in right inguinal fold likely skin tag given raised appearance. Cannot rule out malignancy given change in color and shape, however less likely melanoma given no sun exposure or family history of skin cancers.  -Performed punch biopsy using sterile technique. Both verbal and written informed consent obtained prior to procedure. Punch biopsy performed in clinic using sterile technique. Area was initially prepped with alcohol swab prior to lidocaine injection. Needle with lidocaine was inserted and aspirated to ensure no blood was drawn back. 1cc 1% Lidocaine without epi was injected to raise a wheal. Area was prepped with betadine prior to procedure. Prior to punch biopsy, area was palpated to ensure adequate anesthesia. Disposable punch biopsy was rotated on skin clockwise for 3 turns and biopsied area was lifted off skin with forceps. Gauze applied with pressure and silver nitrate was applied to area until adequate hemostasis achieved. Band aid for patient comfort was placed. Removed area was placed in labeled container and set for biopsy.  Patient tolerated procedure well with negligible blood loss.    Advised patient to look for signs of bleeding or infection and to return if problems arise.

## 2017-05-20 NOTE — Patient Instructions (Addendum)
Skin Biopsy A skin biopsy is a procedure to remove a sample of your skin (lesion) so it can be checked under a microscope. You may need a skin biopsy if you have a skin disease or abnormal changes in your skin. Tell a health care provider about:  Any allergies you have.  All medicines you are taking, including vitamins, herbs, eye drops, creams, and over-the-counter medicines.  Any problems you or family members have had with anesthetic medicines.  Any blood disorders you have.  Any surgeries you have had.  Any medical conditions you have. What are the risks? Generally, this is a safe procedure. However, problems may occur, including:  Infection.  Bleeding.  Allergic reaction to medicines.  Scarring.  What happens before the procedure?  Ask your health care provider about changing or stopping your regular medicines. This is especially important if you are taking diabetes medicines or blood thinners.  Follow instructions from your health care provider about how to care for your skin.  Ask your health care provider how your biopsy site will be marked or identified.  You may be given antibiotic medicine to prevent infection. What happens during the procedure?  To reduce your risk of infection: ? Your health care team will wash or sanitize their hands. ? Your skin will be washed with soap.  You may be given a medicine to help you relax (sedative).  You will be givena medicine to numb the area (local anesthetic).  The method that your health care provider will use for your skin biopsy will depend on the type of skin problem you have. Options include: ? Shave biopsy. Your health care provider will shave away layers of your skin lesion with a sharp blade. After shaving, a gel or ointment may be used to control bleeding. ? Punch biopsy. Your health care provider will use a tool to remove all or part of the lesion. This leaves a small hole about the width of a pencil eraser.  The area may be covered with a gel or ointment. ? Excisional or incisional biopsy. Your health care provider will use a surgical blade to remove all or part of your lesion.  Your skin biopsy site may be closed with stitches (sutures).  A bandage (dressing) will be applied. The procedure may vary among health care providers and hospitals. What happens after the procedure?  Your skin sample will be sent to a laboratory for examination.  Your skin biopsy site will be watched to make sure that it stops bleeding.  Do not drive for 24 hours if you received a sedative. This information is not intended to replace advice given to you by your health care provider. Make sure you discuss any questions you have with your health care provider. Document Released: 09/19/2004 Document Revised: 04/13/2016 Document Reviewed: 11/15/2014 Elsevier Interactive Patient Education  2018 [place].  Seborrheic Keratosis Seborrheic keratosis is a common, noncancerous (benign) skin growth. This condition causes waxy, rough, tan, brown, or black spots to appear on the skin. These skin growths can be flat or raised. What are the causes? The cause of this condition is not known. What increases the risk? This condition is more likely to develop in:  People who have a family history of seborrheic keratosis.  People who are 50 or older.  People who are pregnant.  People who have had estrogen replacement therapy.  What are the signs or symptoms? This condition often occurs on the face, chest, shoulders, back, or other areas. These  growths:  Are usually painless, but may become irritated and itchy.  Can be yellow, brown, black, or other colors.  Are slightly raised or have a flat surface.  Are sometimes rough or wart-like in texture.  Are often waxy on the surface.  Are round or oval-shaped.  Sometimes look like they are "stuck on."  Often occur in groups, but may occur as a single growth.  How is  this diagnosed? This condition is diagnosed with a medical history and physical exam. A sample of the growth may be tested (skin biopsy). You may need to see a skin specialist (dermatologist). How is this treated? Treatment is not usually needed for this condition, unless the growths are irritated or are often bleeding. You may also choose to have the growths removed if you do not like their appearance. Most commonly, these growths are treated with a procedure in which liquid nitrogen is applied to "freeze" off the growth (cryosurgery). They may also be burned off with electricity or cut off. Follow these instructions at home:  Watch your growth for any changes.  Keep all follow-up visits as told by your health care provider. This is important.  Do not scratch or pick at the growth or growths. This can cause them to become irritated or infected. Contact a health care provider if:  You suddenly have many new growths.  Your growth bleeds, itches, or hurts.  Your growth suddenly becomes larger or changes color. This information is not intended to replace advice given to you by your health care provider. Make sure you discuss any questions you have with your health care provider. Document Released: 09/20/2010 Document Revised: 01/24/2016 Document Reviewed: 01/03/2015 Elsevier Interactive Patient Education  2017 [employer].   It was a pleasure meeting you today.   Today we discussed your history of moles and performed a punch biopsy of two of your moles. We will send the biopsy to be tested.  You will be informed of the results, but it may take up to a week for them to result.   Please follow up in 1 week or sooner if symptoms persist or worsen. Please call the clinic immediately if you continue to bleed, have a fever, or the area swell.  Our clinic's number is [phone]. Please call with questions or concerns.   Thank you,  Caroline More, DO

## 2017-05-20 NOTE — Progress Notes (Addendum)
Patient ID: Andrew Huerta, male   DOB: 29-Oct-1965, 51 y.o.   MRN: [id number] Patient referred by his PCP for hyperpigmented lesions on his left mons pubis and right inguinal cleft ongoing for about two years. No changes to the left mons-pubis lesion however, he feels the right inguinal lesion is more irritated and has increased in size.  Exam: Rough surface,flat/macular, hyperpigmented lesion on his right mons pubis just at the base of his penile shaft. Lesion has irregular borders.   A raised, soft, partly pedunculated, partly sessile hyperpigmented lesion at his right inguinal crease.  See measurement below in resident's note.  A/P:  Left mons pubis lesion likely seborrheic keratosis. Punch biopsy procedure performed under a sterile condition after obtaining both verbal and written informed consent. Complicated by very minimal bleeding. Hemostasis achieved by pressure and silver nitrate. Small bandage applied.  Right Inguinal hyperpigmented lesion likely a mole. Less likely malignant melanoma given the fact that it is raised and almost pedunculated and not a sunlight exposed area. Excisional biopsy is best but given the location, it will be difficult to perform. Punch biopsy recommended and both written and verbal informed consent were obtained. Procedure performed under sterile condition. Complicated by minimal bleeding. Hemostasis achieved by pressure and silver nitrate application. Wound dressing completed. F/U in 1 week for wound assessment. I will contact him soon with pathology report and further management.

## 2017-05-20 NOTE — Assessment & Plan Note (Signed)
Nevus on scalp less likely malignant given discrete borders, with no noted change in appearance by patient.  -will continue to monitor -should follow up with PCP  -advised patient to monitor

## 2017-05-20 NOTE — Progress Notes (Signed)
Subjective:    Patient ID: Andrew Huerta, male    DOB: 10-09-1965, 51 y.o.   MRN: [id number]   CC: Mole with possible biopsy  HPI: Mole  Patient reports 3 moles near his penis, both left and right side, that have been progressively increasing in size. Patient reports first noticing moles ~2 years ago but is insure how long ago they began to change in size, shape, and color. Patient states they have been getting darker, larger, and changing in shape slightly. Patient states all moles have gotten bigger but mole around penis is "puffier" and "nastier" looking. Patient denies fluid drainage or bleeding. States some itching and redness in area after rubbing. Patient also noted mole on top of his head a few weeks ago after getting a hair cut but is unsure how long it has been present. Patient denies any excessive sun exposure.   Patient was seen by Dr. Dallas Schimke on 03/23/17 for moles, and was recommended for referral to dermatology clinic for shave biopsy.   PMHx of barrett's esophagus, colonic polyps (unsure of biopsy results), and mole as a 51 y.o. which was removed. No history of skin cancer.   No family hx of skin cancer. Father has a history of stomach cancer. Mother and MGM have a history of lung cancer.   Review of Systems Negative other than noted in HPI   Objective:  BP 110/70   Pulse 67   Temp 98.4 F (36.9 C) (Oral)   Ht 5\' 7"  (1.702 m)   Wt 165 lb (74.8 kg)   SpO2 93%   BMI 25.84 kg/m  Vitals and nursing note reviewed  General: well nourished, in no acute distress HEENT: normocephalic Extremities: no edema  Skin: warm and dry,  scalp: 6 mm x 6 mm, macular, flat, hyperpigmented, distinct border, circular  right inguinal fold: 8 mm x 8.5 mm, raised, sessile, hyperpigmented, some areas of hypopigmentation  left side of mons pubis: 22 mm x 7 mm, hyperpigmented, slightly raised, waxy appearance, indiscrete borders right side of mons pubis: 54mmx5mm, hyperpigmented, flat,    Neuro: alert and oriented, no focal deficits   Assessment & Plan:    Seborrheic keratoses Hyperpigmentation on left side of mons pubis likely due to seborrheic keratosis given waxy appearance and hyperpigmentation.  -performed punch biopsy using sterile technique. Both verbal and written informed consent obtained prior to procedure. Punch biopsy performed in clinic using sterile technique. Area was initially prepped with alcohol swab prior to lidocaine injection. Needle with lidocaine was inserted and aspirated to ensure no blood was drawn back.1cc 1% Lidocaine without epi was injected to raise a wheal. Area was prepped with betadine prior to procedure. Prior to punch biopsy, area was palpated to ensure adequate anesthesia. Disposable punch biopsy was rotated on skin clockwise for 3 turns and biopsied area was lifted off skin with forceps. Gauze applied with pressure and silver nitrate was applied to area until adequate hemostasis achieved. Band aid for patient comfort was placed. Removed area was placed in labeled container and set for biopsy.  Patient tolerated procedure well with negligible blood loss.    Advised patient to look for signs of bleeding or infection and to return if problems arise.    Skin tag Area in right inguinal fold likely skin tag given raised appearance. Cannot rule out malignancy given change in color and shape, however less likely melanoma given no sun exposure or family history of skin cancers.  -Performed punch biopsy using sterile technique.  Both verbal and written informed consent obtained prior to procedure. Punch biopsy performed in clinic using sterile technique. Area was initially prepped with alcohol swab prior to lidocaine injection. Needle with lidocaine was inserted and aspirated to ensure no blood was drawn back. 1cc 1% Lidocaine without epi was injected to raise a wheal. Area was prepped with betadine prior to procedure. Prior to punch biopsy, area was  palpated to ensure adequate anesthesia. Disposable punch biopsy was rotated on skin clockwise for 3 turns and biopsied area was lifted off skin with forceps. Gauze applied with pressure and silver nitrate was applied to area until adequate hemostasis achieved. Band aid for patient comfort was placed. Removed area was placed in labeled container and set for biopsy.  Patient tolerated procedure well with negligible blood loss.    Advised patient to look for signs of bleeding or infection and to return if problems arise.    Nevus, non-neoplastic Nevus on scalp less likely malignant given discrete borders, with no noted change in appearance by patient.  -will continue to monitor -should follow up with PCP  -advised patient to monitor     Return in about 1 week (around 05/27/2017) for Follow up with PCP.   Caroline More, DO, PGY-1

## 2017-05-20 NOTE — Assessment & Plan Note (Signed)
Hyperpigmentation on left side of mons pubis likely due to seborrheic keratosis given waxy appearance and hyperpigmentation.  -performed punch biopsy using sterile technique. Both verbal and written informed consent obtained prior to procedure. Punch biopsy performed in clinic using sterile technique. Area was initially prepped with alcohol swab prior to lidocaine injection. Needle with lidocaine was inserted and aspirated to ensure no blood was drawn back.1cc 1% Lidocaine without epi was injected to raise a wheal. Area was prepped with betadine prior to procedure. Prior to punch biopsy, area was palpated to ensure adequate anesthesia. Disposable punch biopsy was rotated on skin clockwise for 3 turns and biopsied area was lifted off skin with forceps. Gauze applied with pressure and silver nitrate was applied to area until adequate hemostasis achieved. Band aid for patient comfort was placed. Removed area was placed in labeled container and set for biopsy.  Patient tolerated procedure well with negligible blood loss.    Advised patient to look for signs of bleeding or infection and to return if problems arise.

## 2017-05-22 ENCOUNTER — Telehealth: Payer: Self-pay | Admitting: Family Medicine

## 2017-05-22 NOTE — Telephone Encounter (Signed)
Skin biopsy result discussed with patient. He will return for Cryotherapy soon.

## 2017-05-23 ENCOUNTER — Encounter (HOSPITAL_COMMUNITY): Payer: Self-pay

## 2017-05-23 ENCOUNTER — Emergency Department (HOSPITAL_COMMUNITY)
Admission: EM | Admit: 2017-05-23 | Discharge: 2017-05-23 | Disposition: A | Discharge status: Home / Self Care | Payer: Self-pay | Attending: Emergency Medicine | Admitting: Emergency Medicine

## 2017-05-23 DIAGNOSIS — I2510 Atherosclerotic heart disease of native coronary artery without angina pectoris: Secondary | ICD-10-CM | POA: Insufficient documentation

## 2017-05-23 DIAGNOSIS — I10 Essential (primary) hypertension: Secondary | ICD-10-CM | POA: Insufficient documentation

## 2017-05-23 DIAGNOSIS — Z96651 Presence of right artificial knee joint: Secondary | ICD-10-CM | POA: Insufficient documentation

## 2017-05-23 DIAGNOSIS — Z79899 Other long term (current) drug therapy: Secondary | ICD-10-CM | POA: Insufficient documentation

## 2017-05-23 DIAGNOSIS — Z7982 Long term (current) use of aspirin: Secondary | ICD-10-CM | POA: Insufficient documentation

## 2017-05-23 DIAGNOSIS — E119 Type 2 diabetes mellitus without complications: Secondary | ICD-10-CM | POA: Insufficient documentation

## 2017-05-23 DIAGNOSIS — T148XXA Other injury of unspecified body region, initial encounter: Secondary | ICD-10-CM

## 2017-05-23 DIAGNOSIS — Z955 Presence of coronary angioplasty implant and graft: Secondary | ICD-10-CM | POA: Insufficient documentation

## 2017-05-23 DIAGNOSIS — L089 Local infection of the skin and subcutaneous tissue, unspecified: Secondary | ICD-10-CM | POA: Insufficient documentation

## 2017-05-23 LAB — CBC WITH DIFFERENTIAL/PLATELET
Basophils Absolute: 0 10*3/uL (ref 0.0–0.1)
Basophils Relative: 0 %
Eosinophils Absolute: 0.2 10*3/uL (ref 0.0–0.7)
Eosinophils Relative: 1 %
HCT: 40.2 % (ref 39.0–52.0)
Hemoglobin: 13.8 g/dL (ref 13.0–17.0)
LYMPHS PCT: 28 %
Lymphs Abs: 3.6 10*3/uL (ref 0.7–4.0)
MCH: 30.8 pg (ref 26.0–34.0)
MCHC: 34.3 g/dL (ref 30.0–36.0)
MCV: 89.7 fL (ref 78.0–100.0)
MONO ABS: 0.8 10*3/uL (ref 0.1–1.0)
MONOS PCT: 6 %
NEUTROS ABS: 8.3 10*3/uL — AB (ref 1.7–7.7)
Neutrophils Relative %: 65 %
Platelets: 204 10*3/uL (ref 150–400)
RBC: 4.48 MIL/uL (ref 4.22–5.81)
RDW: 13.2 % (ref 11.5–15.5)
WBC: 12.8 10*3/uL — ABNORMAL HIGH (ref 4.0–10.5)

## 2017-05-23 LAB — I-STAT CHEM 8, ED
BUN: 17 mg/dL (ref 6–20)
CALCIUM ION: 1.14 mmol/L — AB (ref 1.15–1.40)
CREATININE: 0.9 mg/dL (ref 0.61–1.24)
Chloride: 104 mmol/L (ref 101–111)
GLUCOSE: 110 mg/dL — AB (ref 65–99)
HCT: 41 % (ref 39.0–52.0)
HEMOGLOBIN: 13.9 g/dL (ref 13.0–17.0)
Potassium: 4.1 mmol/L (ref 3.5–5.1)
Sodium: 139 mmol/L (ref 135–145)
TCO2: 25 mmol/L (ref 22–32)

## 2017-05-23 MED ORDER — CEPHALEXIN 250 MG PO CAPS
500.0000 mg | ORAL_CAPSULE | Freq: Once | ORAL | Status: AC
  Administered 2017-05-23: 500 mg via ORAL
  Filled 2017-05-23: qty 2

## 2017-05-23 MED ORDER — CEPHALEXIN 500 MG PO CAPS
500.0000 mg | ORAL_CAPSULE | Freq: Three times a day (TID) | ORAL | 0 refills | Status: DC
Start: 2017-05-23 — End: 2017-06-01

## 2017-05-23 NOTE — ED Provider Notes (Signed)
[facility] DEPT Provider Note   CSN: [id number] Arrival date & time: 05/23/17  1426     History   Chief Complaint Chief Complaint  Patient presents with  . Wound Infection    HPI Andrew Huerta is a 51 y.o. male.  HPI Andrew Huerta is a 51 y.o. malepresents to emergency department complaining of pain to the right groin. Patient states that he had a mole removed 3 days ago by his family doctor. He states that he came back benign. He reports that yesterday he started seeing some drainage and redness to the mole site. He reports associated swelling that he can palpate in his groin. He denies any fever or chills. No systemic symptoms. He states he has been washing it in the shower, no other treatment. He is concerned it could be infected. Patient is immunosuppressed, on prednisone daily.  Past Medical History:  Diagnosis Date  . Adrenal tumor    a. s/p adrenal gland resection at [facility]. left side removal per patient  . Anxiety   . Barrett's esophagus    EGD - 11/27/09 - short segment of Barrett's  . CAD (coronary artery disease)    a. 04/27/14 [place] s/p DES to mLAD and DES to mLCx  . Chronic back pain    upper and lower per patient  . Chronic chest pain   . Degenerative joint disease    Bilateral knees. Significant knee pain since playing football in high school. also in back per patient  . Depression   . Diabetes mellitus type 2, controlled ([place]) 08/29/2015   Diagnosed by A1c 7.6% during 08/26/15 hospital admission for chest pain  . GERD (gastroesophageal reflux disease)   . Hiatal hernia    EGD - 11/27/2009  . Hyperlipidemia   . Hypertension   . Low back pain   . Primary hyperaldosteronism ([place]) 01/22/2012   S/P adrenalectomy     . PTSD (post-traumatic stress disorder)   . Sleep apnea   . Stone, kidney     Patient Active Problem List   Diagnosis Date Noted  . Seborrheic keratoses 05/20/2017  . Skin tag 05/20/2017  . Nevus, non-neoplastic 05/20/2017  . Renal cyst,  right 11/12/2015  . Diabetes mellitus type 2, controlled ([place]) 08/29/2015  . Chest pain 08/25/2015  . Chronic back pain 04/27/2015  . Left forearm pain 04/27/2015  . Chest pain with moderate risk of acute coronary syndrome   . New onset seizure ([place]) 09/29/2014  . Neck pain 09/25/2014  . Encounter for chronic pain management 08/09/2014  . CAD- 2 V PCI Aug 2015   . Hyperlipidemia   . Nephrolithiasis 10/12/2013  . Primary hyperaldosteronism ([place]) 09/13/2013  . Major depression ([place]) 07/26/2013  . Restless legs syndrome 01/05/2013  . PTSD (post-traumatic stress disorder) 05/07/2012  . Back pain, thoracic 04/14/2012  . Compression fracture 02/05/2012  . Gastroesophageal reflux disease with hiatal hernia 02/05/2012  . Hypercholesterolemia 02/05/2012  . Herniation of nucleus pulposus 02/05/2012  . Apnea, sleep 02/05/2012  . Essential (primary) hypertension 02/05/2012  . Clinical depression 02/05/2012  . Headache(784.0) 11/14/2011  . Atypical chest pain 08/14/2011  . Enlarged prostate 03/26/2011  . Osteoarthrosis involving lower leg 01/08/2010  . Essential hypertension 05/02/2008  . Barrett's esophagus 10/29/2006    Past Surgical History:  Procedure Laterality Date  . ADRENALECTOMY  10/2013  . CARDIAC CATHETERIZATION  05/01/2014   Patent stents, other disease unchanged  . CARDIAC CATHETERIZATION  04/27/2014   Procedure: CORONARY STENT INTERVENTION;  Surgeon: Collier Salina  M Martinique, MD;  Location[facility] CATH LAB;  Service: Cardiovascular;;  DES mid Cx  DES mid LAD  . CARDIAC CATHETERIZATION N/A 03/22/2015   Procedure: Left Heart Cath and Coronary Angiography;  Surgeon: Sherren Mocha, MD;  Location: [facility] CV LAB;  Service: Cardiovascular;  Laterality: N/A;  . CORONARY ANGIOPLASTY WITH STENT PLACEMENT  04/27/2014   3.0 x 16 mm Promus DES to the mid LAD and 3.5 x 28 mm Promus to the mid LCx, otherwise 20-30 percent lesions, EF 55%  . KIDNEY STONE SURGERY  X 1  . KNEE ARTHROPLASTY Right 1984   . KNEE ARTHROSCOPY Right X 6  . LEFT HEART CATHETERIZATION WITH CORONARY ANGIOGRAM N/A 04/27/2014   Procedure: LEFT HEART CATHETERIZATION WITH CORONARY ANGIOGRAM;  Surgeon: Peter M Martinique, MD;  Location: [facility] CATH LAB;  Service: Cardiovascular;  Laterality: N/A;  . LEFT HEART CATHETERIZATION WITH CORONARY ANGIOGRAM N/A 05/01/2014   Procedure: LEFT HEART CATHETERIZATION WITH CORONARY ANGIOGRAM;  Surgeon: Burnell Blanks, MD;  Location: [facility] CATH LAB;  Service: Cardiovascular;  Laterality: N/A;  . LITHOTRIPSY  X 2       Home Medications    Prior to Admission medications   Medication Sig Start Date End Date Taking? Authorizing Provider  aspirin EC 81 MG tablet Take 81 mg by mouth daily.   Yes [provider]  atorvastatin (LIPITOR) 80 MG tablet Take 1 tablet (80 mg total) by mouth daily. 05/30/16  Yes Smiley Houseman, MD  HYDROcodone-acetaminophen (NORCO/VICODIN) 5-325 MG tablet Take 1-2 tablets by mouth every 8 (eight) hours as needed for moderate pain. 05/20/17  Yes Smiley Houseman, MD  metoprolol tartrate (LOPRESSOR) 25 MG tablet Take 0.5 tablets (12.5 mg total) by mouth 2 (two) times daily. 05/30/16  Yes Smiley Houseman, MD  omeprazole (PRILOSEC) 20 MG capsule Take 1 capsule (20 mg total) by mouth daily. 05/13/16  Yes Recardo Evangelist, PA-C  predniSONE (DELTASONE) 10 MG tablet Take 5 tablets daily the first day, then take 4 tablets the next day, then 3 tablets the next day, then 2 tablets, then 1 tablet daily. Patient not taking: Reported on 05/23/2017 05/01/17   Smiley Houseman, MD    Family History Family History  Problem Relation Age of Onset  . Cancer Father        stomach cancer  . Diabetes Father   . Heart attack Paternal Grandfather   . Diabetes Paternal Grandmother     Social History Social History  Substance Use Topics  . Smoking status: Never Smoker  . Smokeless tobacco: Never Used  . Alcohol use 0.0 oz/week     Comment: "seldom" per  patient     Allergies   Cortisone acetate; Darvocet [propoxyphene n-acetaminophen]; Nsaids; Xanax [alprazolam]; and Tape   Review of Systems Review of Systems  Constitutional: Negative for chills and fever.  Respiratory: Negative for cough, chest tightness and shortness of breath.   Cardiovascular: Negative for chest pain, palpitations and leg swelling.  Gastrointestinal: Negative for abdominal distention, abdominal pain, diarrhea, nausea and vomiting.  Genitourinary: Negative for dysuria, frequency, hematuria and urgency.  Musculoskeletal: Negative for arthralgias, myalgias, neck pain and neck stiffness.  Skin: Positive for color change and wound.  Allergic/Immunologic: Negative for immunocompromised state.  Neurological: Negative for dizziness, weakness, light-headedness, numbness and headaches.  All other systems reviewed and are negative.    Physical Exam Updated Vital Signs BP 109/82   Pulse (!) 107   Temp 98.3 F (36.8 C) (Oral)  Resp 16   SpO2 97%   Physical Exam  Constitutional: He appears well-developed and well-nourished. No distress.  HENT:  Head: Normocephalic and atraumatic.  Eyes: Conjunctivae are normal.  Neck: Neck supple.  Cardiovascular: Normal rate, regular rhythm and normal heart sounds.   Pulmonary/Chest: Effort normal. No respiratory distress. He has no wheezes. He has no rales.  Abdominal: Soft. Bowel sounds are normal. He exhibits no distension. There is no tenderness. There is no rebound.  Musculoskeletal: He exhibits no edema.  Neurological: He is alert.  Skin: Skin is warm and dry.  Approximately 1 cm in diameter wound to the right groin, with no drainage. Tender to palpation. Associated right inguinal enlarged lymph nodes. Mild erythema around the wound.  Nursing note and vitals reviewed.    ED Treatments / Results  Labs (all labs ordered are listed, but only abnormal results are displayed) Labs Reviewed  CBC WITH DIFFERENTIAL/PLATELET  - Abnormal; Notable for the following:       Result Value   WBC 12.8 (*)    Neutro Abs 8.3 (*)    All other components within normal limits  I-STAT CHEM 8, ED - Abnormal; Notable for the following:    Glucose, Bld 110 (*)    Calcium, Ion 1.14 (*)    All other components within normal limits    EKG  EKG Interpretation None       Radiology No results found.  Procedures Procedures (including critical care time)  Medications Ordered in ED Medications - No data to display   Initial Impression / Assessment and Plan / ED Course  I have reviewed the triage vital signs and the nursing notes.  Pertinent labs & imaging results that were available during my care of the patient were reviewed by me and considered in my medical decision making (see chart for details).     Patient with a possibly infected mole to the right groin that was removed 2 days ago. No significant drainage on my exam. No palpable abscess. There is some associated lymphadenopathy. There is very small amount of erythema around the wound. We'll check basic labs given patient is diabetic and immunosuppressed. He is tachycardic, otherwise normal vital signs, will recheck.  9:43 PM WBC slightly up, otherwise normal labs. No signs of systemic infection. VS normal. Stable for dc home. Will give keflex. Strict return precautions.   Vitals:   05/23/17 2000 05/23/17 2023 05/23/17 2030 05/23/17 2100  BP: (!) 117/94 (!) 123/107 (!) 133/118 (!) 124/91  Pulse: 80 82 75 85  Resp:  18    Temp:      TempSrc:      SpO2: 97% 97% 97% 100%     Final Clinical Impressions(s) / ED Diagnoses   Final diagnoses:  Wound infection    New Prescriptions New Prescriptions   No medications on file     Janee Morn 05/23/17 2144    Duffy Bruce, MD 05/24/17 1113

## 2017-05-23 NOTE — Discharge Instructions (Signed)
Warm compresses or bath soaks several times a day. Keflex as prescribed until all gone. Recheck with family doctor in 2-3 days. Return if worsening

## 2017-05-23 NOTE — ED Triage Notes (Signed)
Onset 05-20-17 mole at right groin biopsy.  Redness, clear fluid, white fluid, burning, and lump noted at sight.

## 2017-05-25 ENCOUNTER — Encounter: Payer: Self-pay | Admitting: Internal Medicine

## 2017-05-25 ENCOUNTER — Ambulatory Visit (INDEPENDENT_AMBULATORY_CARE_PROVIDER_SITE_OTHER): Payer: Self-pay | Admitting: Internal Medicine

## 2017-05-25 VITALS — BP 120/84 | HR 90 | Temp 98.1°F | Ht 67.0 in | Wt 164.0 lb

## 2017-05-25 DIAGNOSIS — Z23 Encounter for immunization: Secondary | ICD-10-CM

## 2017-05-25 DIAGNOSIS — L989 Disorder of the skin and subcutaneous tissue, unspecified: Secondary | ICD-10-CM

## 2017-05-25 NOTE — Progress Notes (Addendum)
[facility] Progress Note  Subjective:  Andrew Huerta is a 51 y.o. male with history of Barrett's esophagus, T2DM, and back pain who presents for follow-up for skin concern after biopsy. Two biopsies were taken 05/20/17, one near base of penis and another of R groin thought to be a skin tag due to concerns of skin color changes. Area near base of penis has healed well per patient. Area of groin has been painful and draining some clear/beige/greenish fluid. Drainage began 9/21 and patient seen in ED 9/22. No active drainage noted then, but he was started on keflex for WBC slightly increased at 12.8 and slight surrounding redness. He is on day 3 of 7 and continues to note drainage. He says "hole" where biopsy taken seems to be getting a little larger as well. Area is sore. He has been washing area in shower and has tried hot packs. Wears boxer briefs that do not apply too much pressure. Biopsies returned showing keratosis.  ROS: No fever, no n/v/d  Social: Never smoker  Objective: Blood pressure 120/84, pulse 90, temperature 98.1 F (36.7 C), temperature source Oral, height 5\' 7"  (1.702 m), weight 164 lb (74.4 kg), SpO2 97 %. Body mass index is 25.69 kg/m. Constitutional: Well-appearing male in NAD HENT: MMM Skin: ~0.6 cm x 0.6 cm lesion with small amount of gray slough of R groin. Minimal surrounding erythema at crease of groin. Palpation of inguinal lymph nodes feels symmetric bilaterally, though patient notes tenderness on R. Cannot express any fluid; no tunneling.  Psychiatric: Normal mood and affect.  Vitals reviewed  Assessment/Plan: Skin abnormality - Patient afebrile, and wound measurement stable (noted to be ~1 cm x 1 cm on 9/22). Do not believe patient has a cellulitis and that minimal redness is from moisture/irritation at groin. No abscess or significant drainage on exam today.  - Precepted with Dr. McDiarmid. Will add topical antibiotic to help with drainage and  prevent infection. Recommended warm baths to help with discomfort and encourage slough to come off (tried with q-tip but was uncomfortable to patient).  - Complete course of antibiotics so as not to encourage resistance.  - Provided paper rx for calcium alginate with silver to help with moisture and prevent infection in case drainage becomes more bothersome to patient.  - Counseled patient that this area will likely be slow to heal given location with lots of movement.  Follow-up with PCP as scheduled next week.  Olene Floss, MD [place], PGY-3

## 2017-05-25 NOTE — Patient Instructions (Signed)
Mr. Coll,  Please complete keflex course.  I recommend using antibiotic ointment and keeping covered as able. Try calcium alginate with silver for drainage.  This area will take a while to heal. Warning signs are expanding redness or fever.  Best, Dr. Ola Spurr

## 2017-05-27 DIAGNOSIS — L989 Disorder of the skin and subcutaneous tissue, unspecified: Secondary | ICD-10-CM | POA: Insufficient documentation

## 2017-05-27 NOTE — Assessment & Plan Note (Addendum)
-   Patient afebrile, and wound measurement stable (noted to be ~1 cm x 1 cm on 9/22). Do not believe patient has a cellulitis and that minimal redness is from moisture/irritation at groin. No abscess or significant drainage on exam today.  - Precepted with Dr. McDiarmid. Will add topical antibiotic to help with drainage and prevent infection. Recommended warm baths to help with discomfort and encourage slough to come off (tried with q-tip but was uncomfortable to patient).  - Complete course of antibiotics so as not to encourage resistance.  - Provided paper rx for calcium alginate with silver to help with moisture and prevent infection in case drainage becomes more bothersome to patient.  - Counseled patient that this area will likely be slow to heal given location with lots of movement.

## 2017-06-01 ENCOUNTER — Encounter: Payer: Self-pay | Admitting: Internal Medicine

## 2017-06-01 ENCOUNTER — Ambulatory Visit (INDEPENDENT_AMBULATORY_CARE_PROVIDER_SITE_OTHER): Payer: Self-pay | Admitting: Internal Medicine

## 2017-06-01 VITALS — BP 112/70 | HR 107 | Temp 98.2°F | Ht 67.0 in | Wt 165.6 lb

## 2017-06-01 DIAGNOSIS — L989 Disorder of the skin and subcutaneous tissue, unspecified: Secondary | ICD-10-CM

## 2017-06-01 MED ORDER — OMEPRAZOLE 20 MG PO CPDR: 20.0000 mg | DELAYED_RELEASE_CAPSULE | Freq: Every day | ORAL | 0 refills | Status: DC

## 2017-06-01 MED ORDER — METOPROLOL TARTRATE 25 MG PO TABS: 25.0000 mg | ORAL_TABLET | Freq: Two times a day (BID) | ORAL | 0 refills | Status: DC

## 2017-06-01 MED ORDER — ATORVASTATIN CALCIUM 80 MG PO TABS: 80.0000 mg | ORAL_TABLET | Freq: Every day | ORAL | 0 refills | Status: DC

## 2017-06-01 NOTE — Patient Instructions (Signed)
We increased your metoprolol dose to 25mg  twice a day  Please monitor your blood pressure and heart rate Follow up in 3 weeks.

## 2017-06-01 NOTE — Progress Notes (Signed)
   [facility] Phone: [phone]   Date of Visit: 06/01/2017   HPI:  F/u skin concern after biopsy:  - last seen in clinic in 9/24 for follow up biopsy site. At that time Not consistent with cellulitis at that time. Given topical antibiotic.  - seen in the ED on 9/22: given Keflex for this due to slight increase in WBC to 12.8 and slight surrounding redness.  - skin biopy on 9/19 on the right groin region. Pathology consistent with seborrheic keratosis.  - today reports he completed the antibiotics, has been using topical antibiotic and warm baths. His symptoms are improving. He reports he still has some clear discharge from the area. No fevers, chills, redness of the area  DM2, well controlled:  - declines A1c today as he would like to wait until he gets insurance   ROS: See HPI.  [place]:  PMH: HTN HLD CAD s/p PCI 2015 GERD with hiatal hernia Barrett's Esophagus DM2 Nephrolithiasis Enlarged Prostrate Restless Leg Syndrome  PTSD Major Depression  Chronic Pain   PHYSICAL EXAM: BP 112/70   Pulse (!) 107   Temp 98.2 F (36.8 C) (Oral)   Ht 5\' 7"  (1.702 m)   Wt 165 lb 9.6 oz (75.1 kg)   SpO2 96%   BMI 25.94 kg/m  Gen: NAD, nontoxic appearing  Heart: RRR, no m/r/g Lungs: normal effort, CTAB Skin: ~0.4 x 0.4cm lesion, healing well, without drainage that can be expressed, without erythema. No tenderness to palpation. No palpable lymph nodes.  ASSESSMENT/PLAN:  Skin Abnormality:  The biopsy site appears to be healing well without signs of infection. Recommend continued management. No indication for oral antibiotics. Dicussed return precautions and signs/symptoms of infection.   Follow up in 3 weeks  Smiley Houseman, MD PGY [place]
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
## 2017-06-23 ENCOUNTER — Encounter: Payer: Self-pay | Admitting: Internal Medicine

## 2017-06-23 ENCOUNTER — Ambulatory Visit (INDEPENDENT_AMBULATORY_CARE_PROVIDER_SITE_OTHER): Payer: Self-pay | Admitting: Internal Medicine

## 2017-06-23 VITALS — BP 138/90 | HR 80 | Temp 97.7°F | Wt 166.0 lb

## 2017-06-23 DIAGNOSIS — E119 Type 2 diabetes mellitus without complications: Secondary | ICD-10-CM

## 2017-06-23 DIAGNOSIS — E1140 Type 2 diabetes mellitus with diabetic neuropathy, unspecified: Secondary | ICD-10-CM | POA: Insufficient documentation

## 2017-06-23 DIAGNOSIS — G8929 Other chronic pain: Secondary | ICD-10-CM

## 2017-06-23 DIAGNOSIS — G894 Chronic pain syndrome: Secondary | ICD-10-CM

## 2017-06-23 DIAGNOSIS — E1149 Type 2 diabetes mellitus with other diabetic neurological complication: Secondary | ICD-10-CM

## 2017-06-23 DIAGNOSIS — I10 Essential (primary) hypertension: Secondary | ICD-10-CM

## 2017-06-23 LAB — POCT GLYCOSYLATED HEMOGLOBIN (HGB A1C): Hemoglobin A1C: 6.9

## 2017-06-23 MED ORDER — HYDROCODONE-ACETAMINOPHEN 5-325 MG PO TABS: 1.0000 | ORAL_TABLET | Freq: Three times a day (TID) | ORAL | 0 refills | Status: DC | PRN

## 2017-06-23 NOTE — Assessment & Plan Note (Signed)
Controlled Substance database reviewed and appropriate. Refilled one month. I told him that he could request refill for next month near the end of next month and I can print rx for him.

## 2017-06-23 NOTE — Assessment & Plan Note (Addendum)
A1c 6.9 today from 6.3 in 10/2016. Continued diet control. He is unable to afford urine microalbumin test. Foot exam with mild decrease in sensation testing with monofilament. Follow up in 3 months.

## 2017-06-23 NOTE — Assessment & Plan Note (Signed)
We increased his metoprolol at the last visit due to mild tachycardia. Patient reports he may have been feeling more tired since this dose increase. He is to try 12.5mg  BID to see if there is a difference. If not he is to go back to 25mg  BID.

## 2017-06-23 NOTE — Patient Instructions (Addendum)
Thank you for coming in.  Please touch base with Medicaid to see what your status is with this  See if your tiredness improves with taking half of metroprolol (12.5mg  twice a day). If not, then go back to 25mg  twice daily.   Follow up in 3 months.

## 2017-07-20 ENCOUNTER — Other Ambulatory Visit: Payer: Self-pay | Admitting: *Deleted

## 2017-07-20 DIAGNOSIS — G894 Chronic pain syndrome: Secondary | ICD-10-CM

## 2017-07-20 NOTE — Telephone Encounter (Signed)
Please call pt when rx ready. Hubbard Hartshorn, RN, BSN

## 2017-07-21 MED ORDER — HYDROCODONE-ACETAMINOPHEN 5-325 MG PO TABS: 1.0000 | ORAL_TABLET | Freq: Three times a day (TID) | ORAL | 0 refills | Status: DC | PRN

## 2017-07-21 NOTE — Telephone Encounter (Signed)
Patient is aware. Jazmin Hartsell,CMA  

## 2017-07-21 NOTE — Telephone Encounter (Signed)
Rx printed and placed at front desk. Please inform patient.   Additionally controlled substance database reviewed and is appropriate.

## 2017-08-12 ENCOUNTER — Other Ambulatory Visit: Payer: Self-pay | Admitting: Internal Medicine

## 2017-08-14 ENCOUNTER — Other Ambulatory Visit: Payer: Self-pay | Admitting: *Deleted

## 2017-08-14 DIAGNOSIS — G894 Chronic pain syndrome: Secondary | ICD-10-CM

## 2017-08-17 MED ORDER — HYDROCODONE-ACETAMINOPHEN 5-325 MG PO TABS: 1.0000 | ORAL_TABLET | Freq: Three times a day (TID) | ORAL | 0 refills | Status: DC | PRN

## 2017-08-17 NOTE — Telephone Encounter (Signed)
Please inform patient Rx if ready for pick up

## 2017-08-17 NOTE — Telephone Encounter (Signed)
Patient informed.  Leif Loflin,CMA  

## 2017-09-21 ENCOUNTER — Other Ambulatory Visit: Payer: Self-pay | Admitting: *Deleted

## 2017-09-21 DIAGNOSIS — G894 Chronic pain syndrome: Secondary | ICD-10-CM

## 2017-09-21 NOTE — Telephone Encounter (Signed)
Patient left message on nurse line requesting refill on Vicodin. Please call pt when Rx ready. Hubbard Hartshorn, RN, BSN

## 2017-09-23 MED ORDER — HYDROCODONE-ACETAMINOPHEN 5-325 MG PO TABS: 1.0000 | ORAL_TABLET | Freq: Three times a day (TID) | ORAL | 0 refills | Status: DC | PRN

## 2017-10-22 ENCOUNTER — Other Ambulatory Visit: Payer: Self-pay

## 2017-10-22 DIAGNOSIS — G894 Chronic pain syndrome: Secondary | ICD-10-CM

## 2017-10-22 NOTE — Telephone Encounter (Signed)
Patient left message on nurse line asking for refill on Hydrocodone. [name], RN [facility] RN)

## 2017-10-23 MED ORDER — HYDROCODONE-ACETAMINOPHEN 5-325 MG PO TABS: 1.0000 | ORAL_TABLET | Freq: Three times a day (TID) | ORAL | 0 refills | Status: DC | PRN

## 2017-11-19 ENCOUNTER — Other Ambulatory Visit: Payer: Self-pay | Admitting: Internal Medicine

## 2017-11-19 DIAGNOSIS — G894 Chronic pain syndrome: Secondary | ICD-10-CM

## 2017-12-21 ENCOUNTER — Other Ambulatory Visit: Payer: Self-pay

## 2017-12-21 DIAGNOSIS — G894 Chronic pain syndrome: Secondary | ICD-10-CM

## 2017-12-21 MED ORDER — HYDROCODONE-ACETAMINOPHEN 5-325 MG PO TABS: 1.0000 | ORAL_TABLET | Freq: Three times a day (TID) | ORAL | 0 refills | Status: DC | PRN

## 2017-12-21 NOTE — Telephone Encounter (Signed)
Please ask the patient to let us know which pharmacy he would like the prescription sent to. I would rather e-prescribe this medication when possible. He should also make a follow up visit with me.

## 2017-12-21 NOTE — Telephone Encounter (Signed)
Sent. Please have patient make a follow up appointment with me in the near future.

## 2017-12-21 NOTE — Telephone Encounter (Signed)
Patient left message requesting Vicodin refill. Wants hard copy because his pharmacy has closed.  Call back is [phone].  [name], RN [facility] RN)

## 2017-12-21 NOTE — Telephone Encounter (Signed)
Spoke with patient and will use the [facility] in [place] since he works at that location.  Will forward to MD. Andrew Huerta

## 2017-12-21 NOTE — Telephone Encounter (Signed)
I called pt to schedule a FU apt- pt is unable to make an apt at this time due to his mothers illness. Pt states she will call back to schedule when he knows her doctor schedule for May. Informed pt no more refills until he is seen.

## 2018-01-21 ENCOUNTER — Other Ambulatory Visit: Payer: Self-pay

## 2018-01-21 ENCOUNTER — Telehealth: Payer: Self-pay | Admitting: Internal Medicine

## 2018-01-21 DIAGNOSIS — G894 Chronic pain syndrome: Secondary | ICD-10-CM

## 2018-01-21 MED ORDER — HYDROCODONE-ACETAMINOPHEN 5-325 MG PO TABS: 1.0000 | ORAL_TABLET | Freq: Three times a day (TID) | ORAL | 0 refills | Status: DC | PRN

## 2018-01-21 NOTE — Telephone Encounter (Signed)
Patient left message that he has scheduled an appt for 01/29/18 (first he could get with PCP). Requests pain med refill until then.  Call back is [phone].  [name], RN [facility] RN)

## 2018-01-21 NOTE — Telephone Encounter (Signed)
Please aks patient which [facility] he would like me to send the RX.

## 2018-01-21 NOTE — Telephone Encounter (Signed)
pts hydrocodone was called in to [facility] but it will cost$94.  They have stopped taking the Good RX card.  Please send another RX to [facility] at [address]

## 2018-01-21 NOTE — Telephone Encounter (Signed)
Will forward to MD to advise. Azriel Dancy,CMA  

## 2018-01-22 MED ORDER — HYDROCODONE-ACETAMINOPHEN 5-325 MG PO TABS: 1.0000 | ORAL_TABLET | Freq: Three times a day (TID) | ORAL | 0 refills | Status: DC | PRN

## 2018-01-22 NOTE — Telephone Encounter (Signed)
[facility] verified with patient.  Jazmin Hartsell,CMA

## 2018-01-22 NOTE — Telephone Encounter (Signed)
Rx sent 

## 2018-01-22 NOTE — Addendum Note (Signed)
Addended by: Smiley Houseman on: 01/22/2018 12:26 PM   Modules accepted: Orders

## 2018-01-22 NOTE — Addendum Note (Signed)
Addended by: Smiley Houseman on: 01/22/2018 10:19 AM   Modules accepted: Orders
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
## 2018-01-28 ENCOUNTER — Encounter: Payer: Self-pay | Admitting: Internal Medicine

## 2018-01-28 ENCOUNTER — Other Ambulatory Visit: Payer: Self-pay

## 2018-01-28 ENCOUNTER — Ambulatory Visit (INDEPENDENT_AMBULATORY_CARE_PROVIDER_SITE_OTHER): Payer: Self-pay | Admitting: Internal Medicine

## 2018-01-28 VITALS — BP 120/76 | HR 100 | Temp 98.3°F | Wt 162.0 lb

## 2018-01-28 DIAGNOSIS — M722 Plantar fascial fibromatosis: Secondary | ICD-10-CM

## 2018-01-28 DIAGNOSIS — E1121 Type 2 diabetes mellitus with diabetic nephropathy: Secondary | ICD-10-CM

## 2018-01-28 DIAGNOSIS — IMO0002 Reserved for concepts with insufficient information to code with codable children: Secondary | ICD-10-CM

## 2018-01-28 DIAGNOSIS — G8929 Other chronic pain: Secondary | ICD-10-CM

## 2018-01-28 DIAGNOSIS — M546 Pain in thoracic spine: Secondary | ICD-10-CM

## 2018-01-28 DIAGNOSIS — M1710 Unilateral primary osteoarthritis, unspecified knee: Secondary | ICD-10-CM

## 2018-01-28 LAB — POCT GLYCOSYLATED HEMOGLOBIN (HGB A1C): HBA1C, POC (CONTROLLED DIABETIC RANGE): 6.9 % (ref 0.0–7.0)

## 2018-01-28 NOTE — Assessment & Plan Note (Addendum)
As mentioned before, he has severe knee osteoarthritis bilaterally.  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  He has tried many other medications without improvement of his symptoms.  Norco helps him stay functional.  Discussed importance of keeping good quadriceps hamstring.  Provided exercises to help with this.  Will obtain urine drug screen today for routine check.

## 2018-01-28 NOTE — Assessment & Plan Note (Signed)
A1c seems to be controlled at 6.9 today.  He needs to have a urine microscopy to check for microalbumin urea.  However, due to lack of insurance we will postpone this.

## 2018-01-28 NOTE — Patient Instructions (Addendum)
Your A1c is good today. Next one will be due in 6 months  1) it seems like you have plantar fasciitis. Please try to do the exercises to help with this   Plantar Fasciitis Plantar fasciitis is a painful foot condition that affects the heel. It occurs when the band of tissue that connects the toes to the heel bone (plantar fascia) becomes irritated. This can happen after exercising too much or doing other repetitive activities (overuse injury). The pain from plantar fasciitis can range from mild irritation to severe pain that makes it difficult for you to walk or move. The pain is usually worse in the morning or after you have been sitting or lying down for a while. What are the causes? This condition may be caused by:  Standing for long periods of time.  Wearing shoes that do not fit.  Doing high-impact activities, including running, aerobics, and ballet.  Being overweight.  Having an abnormal way of walking (gait).  Having tight calf muscles.  Having high arches in your feet.  Starting a new athletic activity.  What are the signs or symptoms? The main symptom of this condition is heel pain. Other symptoms include:  Pain that gets worse after activity or exercise.  Pain that is worse in the morning or after resting.  Pain that goes away after you walk for a few minutes.  How is this diagnosed? This condition may be diagnosed based on your signs and symptoms. Your health care provider will also do a physical exam to check for:  A tender area on the bottom of your foot.  A high arch in your foot.  Pain when you move your foot.  Difficulty moving your foot.  You may also need to have imaging studies to confirm the diagnosis. These can include:  X-rays.  Ultrasound.  MRI.  How is this treated? Treatment for plantar fasciitis depends on the severity of the condition. Your treatment may include:  Rest, ice, and over-the-counter pain medicines to manage your  pain.  Exercises to stretch your calves and your plantar fascia.  A splint that holds your foot in a stretched, upward position while you sleep (night splint).  Physical therapy to relieve symptoms and prevent problems in the future.  Cortisone injections to relieve severe pain.  Extracorporeal shock wave therapy (ESWT) to stimulate damaged plantar fascia with electrical impulses. It is often used as a last resort before surgery.  Surgery, if other treatments have not worked after 12 months.  Follow these instructions at home:  Take medicines only as directed by your health care provider.  Avoid activities that cause pain.  Roll the bottom of your foot over a bag of ice or a bottle of cold water. Do this for 20 minutes, 3-4 times a day.  Perform simple stretches as directed by your health care provider.  Try wearing athletic shoes with air-sole or gel-sole cushions or soft shoe inserts.  Wear a night splint while sleeping, if directed by your health care provider.  Keep all follow-up appointments with your health care provider. How is this prevented?  Do not perform exercises or activities that cause heel pain.  Consider finding low-impact activities if you continue to have problems.  Lose weight if you need to. The best way to prevent plantar fasciitis is to avoid the activities that aggravate your plantar fascia. Contact a health care provider if:  Your symptoms do not go away after treatment with home care measures.  Your pain  gets worse.  Your pain affects your ability to move or do your daily activities. This information is not intended to replace advice given to you by your health care provider. Make sure you discuss any questions you have with your health care provider. Document Released: 05/13/2001 Document Revised: 01/21/2016 Document Reviewed: 06/28/2014 Elsevier Interactive Patient Education  2018 [employer].    Knee Exercises Ask your health care  provider which exercises are safe for you. Do exercises exactly as told by your health care provider and adjust them as directed. It is normal to feel mild stretching, pulling, tightness, or discomfort as you do these exercises, but you should stop right away if you feel sudden pain or your pain gets worse.Do not begin these exercises until told by your health care provider. STRETCHING AND RANGE OF MOTION EXERCISES These exercises warm up your muscles and joints and improve the movement and flexibility of your knee. These exercises also help to relieve pain, numbness, and tingling. Exercise A: Knee Extension, Prone 1. Lie on your abdomen on a bed. 2. Place your left / right knee just beyond the edge of the surface so your knee is not on the bed. You can put a towel under your left / right thigh just above your knee for comfort. 3. Relax your leg muscles and allow gravity to straighten your knee. You should feel a stretch behind your left / right knee. 4. Hold this position for __________ seconds. 5. Scoot up so your knee is supported between repetitions. Repeat __________ times. Complete this stretch __________ times a day. Exercise B: Knee Flexion, Active  1. Lie on your back with both knees straight. If this causes back discomfort, bend your left / right knee so your foot is flat on the floor. 2. Slowly slide your left / right heel back toward your buttocks until you feel a gentle stretch in the front of your knee or thigh. 3. Hold this position for __________ seconds. 4. Slowly slide your left / right heel back to the starting position. Repeat __________ times. Complete this exercise __________ times a day. Exercise C: Quadriceps, Prone  1. Lie on your abdomen on a firm surface, such as a bed or padded floor. 2. Bend your left / right knee and hold your ankle. If you cannot reach your ankle or pant leg, loop a belt around your foot and grab the belt instead. 3. Gently pull your heel toward  your buttocks. Your knee should not slide out to the side. You should feel a stretch in the front of your thigh and knee. 4. Hold this position for __________ seconds. Repeat __________ times. Complete this stretch __________ times a day. Exercise D: Hamstring, Supine 1. Lie on your back. 2. Loop a belt or towel over the ball of your left / right foot. The ball of your foot is on the walking surface, right under your toes. 3. Straighten your left / right knee and slowly pull on the belt to raise your leg until you feel a gentle stretch behind your knee. ? Do not let your left / right knee bend while you do this. ? Keep your other leg flat on the floor. 4. Hold this position for __________ seconds. Repeat __________ times. Complete this stretch __________ times a day. STRENGTHENING EXERCISES These exercises build strength and endurance in your knee. Endurance is the ability to use your muscles for a long time, even after they get tired. Exercise E: Quadriceps, Isometric  1. Shanda Howells  on your back with your left / right leg extended and your other knee bent. Put a rolled towel or small pillow under your knee if told by your health care provider. 2. Slowly tense the muscles in the front of your left / right thigh. You should see your kneecap slide up toward your hip or see increased dimpling just above the knee. This motion will push the back of the knee toward the floor. 3. For __________ seconds, keep the muscle as tight as you can without increasing your pain. 4. Relax the muscles slowly and completely. Repeat __________ times. Complete this exercise __________ times a day. Exercise F: Straight Leg Raises - Quadriceps 1. Lie on your back with your left / right leg extended and your other knee bent. 2. Tense the muscles in the front of your left / right thigh. You should see your kneecap slide up or see increased dimpling just above the knee. Your thigh may even shake a bit. 3. Keep these muscles  tight as you raise your leg 4-6 inches (10-15 cm) off the floor. Do not let your knee bend. 4. Hold this position for __________ seconds. 5. Keep these muscles tense as you lower your leg. 6. Relax your muscles slowly and completely after each repetition. Repeat __________ times. Complete this exercise __________ times a day. Exercise G: Hamstring, Isometric 1. Lie on your back on a firm surface. 2. Bend your left / right knee approximately __________ degrees. 3. Dig your left / right heel into the surface as if you are trying to pull it toward your buttocks. Tighten the muscles in the back of your thighs to dig as hard as you can without increasing any pain. 4. Hold this position for __________ seconds. 5. Release the tension gradually and allow your muscles to relax completely for __________ seconds after each repetition. Repeat __________ times. Complete this exercise __________ times a day. Exercise H: Hamstring Curls  If told by your health care provider, do this exercise while wearing ankle weights. Begin with __________ weights. Then increase the weight by 1 lb (0.5 kg) increments. Do not wear ankle weights that are more than __________. 1. Lie on your abdomen with your legs straight. 2. Bend your left / right knee as far as you can without feeling pain. Keep your hips flat against the floor. 3. Hold this position for __________ seconds. 4. Slowly lower your leg to the starting position.  Repeat __________ times. Complete this exercise __________ times a day. Exercise I: Squats (Quadriceps) 1. Stand in front of a table, with your feet and knees pointing straight ahead. You may rest your hands on the table for balance but not for support. 2. Slowly bend your knees and lower your hips like you are going to sit in a chair. ? Keep your weight over your heels, not over your toes. ? Keep your lower legs upright so they are parallel with the table legs. ? Do not let your hips go lower than  your knees. ? Do not bend lower than told by your health care provider. ? If your knee pain increases, do not bend as low. 3. Hold the squat position for __________ seconds. 4. Slowly push with your legs to return to standing. Do not use your hands to pull yourself to standing. Repeat __________ times. Complete this exercise __________ times a day. Exercise J: Wall Slides (Quadriceps)  1. Lean your back against a smooth wall or door while you walk your feet out  18-24 inches (46-61 cm) from it. 2. Place your feet hip-width apart. 3. Slowly slide down the wall or door until your knees bend __________ degrees. Keep your knees over your heels, not over your toes. Keep your knees in line with your hips. 4. Hold for __________ seconds. Repeat __________ times. Complete this exercise __________ times a day. Exercise K: Straight Leg Raises - Hip Abductors 1. Lie on your side with your left / right leg in the top position. Lie so your head, shoulder, knee, and hip line up. You may bend your bottom knee to help you keep your balance. 2. Roll your hips slightly forward so your hips are stacked directly over each other and your left / right knee is facing forward. 3. Leading with your heel, lift your top leg 4-6 inches (10-15 cm). You should feel the muscles in your outer hip lifting. ? Do not let your foot drift forward. ? Do not let your knee roll toward the ceiling. 4. Hold this position for __________ seconds. 5. Slowly return your leg to the starting position. 6. Let your muscles relax completely after each repetition. Repeat __________ times. Complete this exercise __________ times a day. Exercise L: Straight Leg Raises - Hip Extensors 1. Lie on your abdomen on a firm surface. You can put a pillow under your hips if that is more comfortable. 2. Tense the muscles in your buttocks and lift your left / right leg about 4-6 inches (10-15 cm). Keep your knee straight as you lift your leg. 3. Hold this  position for __________ seconds. 4. Slowly lower your leg to the starting position. 5. Let your leg relax completely after each repetition. Repeat __________ times. Complete this exercise __________ times a day. This information is not intended to replace advice given to you by your health care provider. Make sure you discuss any questions you have with your health care provider. Document Released: 07/02/2005 Document Revised: 05/12/2016 Document Reviewed: 06/24/2015 Elsevier Interactive Patient Education  2018 [employer].

## 2018-01-28 NOTE — Assessment & Plan Note (Signed)
Acute worsening of his chronic back pain due to increased activity with lifting his mom.  Discussed the importance of strengthening his back muscles as well as his core muscles to assist his spine.  Exercises given.

## 2018-02-03 LAB — TOXASSURE SELECT 13 (MW), URINE

## 2018-02-22 ENCOUNTER — Other Ambulatory Visit: Payer: Self-pay

## 2018-02-22 DIAGNOSIS — G894 Chronic pain syndrome: Secondary | ICD-10-CM

## 2018-02-22 MED ORDER — HYDROCODONE-ACETAMINOPHEN 5-325 MG PO TABS: 1.0000 | ORAL_TABLET | Freq: Three times a day (TID) | ORAL | 0 refills | Status: DC | PRN

## 2018-03-20 ENCOUNTER — Encounter (HOSPITAL_COMMUNITY): Payer: Self-pay | Admitting: Emergency Medicine

## 2018-03-20 ENCOUNTER — Emergency Department (HOSPITAL_COMMUNITY)
Admission: EM | Admit: 2018-03-20 | Discharge: 2018-03-20 | Disposition: A | Discharge status: Home / Self Care | Payer: Self-pay | Attending: Emergency Medicine | Admitting: Emergency Medicine

## 2018-03-20 ENCOUNTER — Other Ambulatory Visit: Payer: Self-pay

## 2018-03-20 DIAGNOSIS — E1140 Type 2 diabetes mellitus with diabetic neuropathy, unspecified: Secondary | ICD-10-CM | POA: Insufficient documentation

## 2018-03-20 DIAGNOSIS — Y9389 Activity, other specified: Secondary | ICD-10-CM | POA: Insufficient documentation

## 2018-03-20 DIAGNOSIS — I10 Essential (primary) hypertension: Secondary | ICD-10-CM | POA: Insufficient documentation

## 2018-03-20 DIAGNOSIS — R410 Disorientation, unspecified: Secondary | ICD-10-CM | POA: Insufficient documentation

## 2018-03-20 DIAGNOSIS — Z96651 Presence of right artificial knee joint: Secondary | ICD-10-CM | POA: Insufficient documentation

## 2018-03-20 DIAGNOSIS — H53149 Visual discomfort, unspecified: Secondary | ICD-10-CM | POA: Insufficient documentation

## 2018-03-20 DIAGNOSIS — Z79899 Other long term (current) drug therapy: Secondary | ICD-10-CM | POA: Insufficient documentation

## 2018-03-20 DIAGNOSIS — F0781 Postconcussional syndrome: Secondary | ICD-10-CM | POA: Insufficient documentation

## 2018-03-20 DIAGNOSIS — Y929 Unspecified place or not applicable: Secondary | ICD-10-CM | POA: Insufficient documentation

## 2018-03-20 DIAGNOSIS — R51 Headache: Secondary | ICD-10-CM | POA: Insufficient documentation

## 2018-03-20 DIAGNOSIS — R112 Nausea with vomiting, unspecified: Secondary | ICD-10-CM | POA: Insufficient documentation

## 2018-03-20 DIAGNOSIS — W19XXXA Unspecified fall, initial encounter: Secondary | ICD-10-CM | POA: Insufficient documentation

## 2018-03-20 DIAGNOSIS — Y998 Other external cause status: Secondary | ICD-10-CM | POA: Insufficient documentation

## 2018-03-20 DIAGNOSIS — S069X0A Unspecified intracranial injury without loss of consciousness, initial encounter: Secondary | ICD-10-CM | POA: Insufficient documentation

## 2018-03-20 DIAGNOSIS — I2510 Atherosclerotic heart disease of native coronary artery without angina pectoris: Secondary | ICD-10-CM | POA: Insufficient documentation

## 2018-03-20 DIAGNOSIS — Z7982 Long term (current) use of aspirin: Secondary | ICD-10-CM | POA: Insufficient documentation

## 2018-03-20 LAB — CBG MONITORING, ED: GLUCOSE-CAPILLARY: 173 mg/dL — AB (ref 70–99)

## 2018-03-20 MED ORDER — ONDANSETRON 4 MG PO TBDP: 4.0000 mg | ORAL_TABLET | Freq: Three times a day (TID) | ORAL | 0 refills | Status: DC | PRN

## 2018-03-20 MED ORDER — ONDANSETRON 4 MG PO TBDP
4.0000 mg | ORAL_TABLET | Freq: Once | ORAL | Status: AC
  Administered 2018-03-20: 4 mg via ORAL
  Filled 2018-03-20: qty 1

## 2018-03-20 NOTE — ED Notes (Signed)
Patient is tolerating PO fluids

## 2018-03-20 NOTE — Discharge Instructions (Signed)
We saw in the ER for the headaches and nausea with vomiting. Your symptoms started after you fell down, and they appear to be due to concussion syndrome.  Please read the instructions provided. Return to the ER if your symptoms get worse. Please continue with routine activities, however you need to take a break if you start having worsening of your symptoms. Consider seeing a concussion specialist that we have recommended.

## 2018-03-20 NOTE — ED Provider Notes (Addendum)
[facility] EMERGENCY DEPARTMENT Provider Note   CSN: [id number] Arrival date & time: 03/20/18  1813     History   Chief Complaint Chief Complaint  Patient presents with  . Fall    HPI Andrew Huerta is a 52 y.o. male.  HPI 52 year old male comes in with chief complaint of headache and confusion. Patient has history of Barrett's esophagus, CAD, diabetes, GERD.  He reports that he had a mechanical fall on Thursday.  Patient fell forwards, and struck his head onto a ramp.  Since then patient has been having headaches with nausea, emesis, having difficulty with concentration and completing tasks.  Patient has no vision change, but he is having some photophobia.  Patient's symptoms are not getting worse.  He went to work today, and was having difficulty getting his tasks done, therefore the work sent him to the ER.  Patient does not take any medications at this time.  Past Medical History:  Diagnosis Date  . Adrenal tumor    a. s/p adrenal gland resection at [facility]. left side removal per patient  . Anxiety   . Barrett's esophagus    EGD - 11/27/09 - short segment of Barrett's  . CAD (coronary artery disease)    a. 04/27/14 [place] s/p DES to mLAD and DES to mLCx  . Chronic back pain    upper and lower per patient  . Chronic chest pain   . Degenerative joint disease    Bilateral knees. Significant knee pain since playing football in high school. also in back per patient  . Depression   . Diabetes mellitus type 2, controlled ([place]) 08/29/2015   Diagnosed by A1c 7.6% during 08/26/15 hospital admission for chest pain  . Gastroesophageal reflux disease with hiatal hernia   . GERD (gastroesophageal reflux disease)   . Hiatal hernia    EGD - 11/27/2009  . Hyperlipidemia   . Hypertension   . Low back pain   . Primary hyperaldosteronism ([place]) 01/22/2012   S/P adrenalectomy     . PTSD (post-traumatic stress disorder)   . Sleep apnea   . Stone, kidney     Patient Active Problem List   Diagnosis Date Noted  . Diabetic neuropathy ([place]) 06/23/2017  . Skin abnormality 05/27/2017  . Seborrheic keratoses 05/20/2017  . Nevus, non-neoplastic 05/20/2017  . Renal cyst, right 11/12/2015  . Diabetes mellitus type 2, controlled ([place]) 08/29/2015  . Chronic back pain 04/27/2015  . Chest pain with moderate risk of acute coronary syndrome   . New onset seizure ([place]) 09/29/2014  . Neck pain 09/25/2014  . Encounter for chronic pain management 08/09/2014  . CAD- 2 V PCI Aug 2015   . Hyperlipidemia   . Nephrolithiasis 10/12/2013  . Primary hyperaldosteronism ([place]) 09/13/2013  . Major depression ([place]) 07/26/2013  . Restless legs syndrome 01/05/2013  . PTSD (post-traumatic stress disorder) 05/07/2012  . Back pain, thoracic 04/14/2012  . Gastroesophageal reflux disease with hiatal hernia 02/05/2012  . Hypercholesterolemia 02/05/2012  . Herniation of nucleus pulposus 02/05/2012  . Apnea, sleep 02/05/2012  . Headache(784.0) 11/14/2011  . Atypical chest pain 08/14/2011  . Enlarged prostate 03/26/2011  . Osteoarthrosis involving lower leg 01/08/2010  . Essential hypertension 05/02/2008  . Barrett's esophagus 10/29/2006    Past Surgical History:  Procedure Laterality Date  . ADRENALECTOMY  10/2013  . CARDIAC CATHETERIZATION  05/01/2014   Patent stents, other disease unchanged  . CARDIAC CATHETERIZATION  04/27/2014   Procedure: CORONARY STENT INTERVENTION;  Surgeon: Peter M Martinique,  MD;  Location: [facility] CATH LAB;  Service: Cardiovascular;;  DES mid Cx  DES mid LAD  . CARDIAC CATHETERIZATION N/A 03/22/2015   Procedure: Left Heart Cath and Coronary Angiography;  Surgeon: Sherren Mocha, MD;  Location: [place] CV LAB;  Service: Cardiovascular;  Laterality: N/A;  . CORONARY ANGIOPLASTY WITH STENT PLACEMENT  04/27/2014   3.0 x 16 mm Promus DES to the mid LAD and 3.5 x 28 mm Promus to the mid LCx, otherwise 20-30 percent lesions, EF 55%  . KIDNEY STONE SURGERY  X 1  . KNEE ARTHROPLASTY Right  1984  . KNEE ARTHROSCOPY Right X 6  . LEFT HEART CATHETERIZATION WITH CORONARY ANGIOGRAM N/A 04/27/2014   Procedure: LEFT HEART CATHETERIZATION WITH CORONARY ANGIOGRAM;  Surgeon: Peter M Martinique, MD;  Location: [facility] CATH LAB;  Service: Cardiovascular;  Laterality: N/A;  . LEFT HEART CATHETERIZATION WITH CORONARY ANGIOGRAM N/A 05/01/2014   Procedure: LEFT HEART CATHETERIZATION WITH CORONARY ANGIOGRAM;  Surgeon: Burnell Blanks, MD;  Location[facility] CATH LAB;  Service: Cardiovascular;  Laterality: N/A;  . LITHOTRIPSY  X 2        Home Medications    Prior to Admission medications   Medication Sig Start Date End Date Taking? Authorizing Provider  aspirin EC 81 MG tablet Take 81 mg by mouth daily.    [provider]  atorvastatin (LIPITOR) 80 MG tablet Take 1 tablet (80 mg total) by mouth daily. 06/01/17   Smiley Houseman, MD  HYDROcodone-acetaminophen (NORCO/VICODIN) 5-325 MG tablet Take 1-2 tablets by mouth every 8 (eight) hours as needed for moderate pain. 02/22/18   Smiley Houseman, MD  metoprolol tartrate (LOPRESSOR) 25 MG tablet Take 1 tablet (25 mg total) by mouth 2 (two) times daily. Patient taking differently: Take 12.5 mg by mouth 2 (two) times daily.  06/01/17   Smiley Houseman, MD  omeprazole (PRILOSEC) 20 MG capsule Take 1 capsule (20 mg total) by mouth daily. 08/12/17   Smiley Houseman, MD  ondansetron (ZOFRAN ODT) 4 MG disintegrating tablet Take 1 tablet (4 mg total) by mouth every 8 (eight) hours as needed for nausea or vomiting. 03/20/18   Varney Biles, MD    Family History Family History  Problem Relation Age of Onset  . Cancer Father        stomach cancer  . Diabetes Father   . Heart attack Paternal Grandfather   . Diabetes Paternal Grandmother     Social History Social History   Tobacco Use  . Smoking status: Never Smoker  . Smokeless tobacco: Never Used  Substance Use Topics  . Alcohol use: Yes    Alcohol/week: 0.0 oz    Comment:  "seldom" per patient  . Drug use: Not Currently    Types: Marijuana    Comment: "a long time ago" per patient     Allergies   Cortisone acetate; Darvocet [propoxyphene n-acetaminophen]; Nsaids; Xanax [alprazolam]; and Tape   Review of Systems Review of Systems  Constitutional: Positive for activity change.  Eyes: Positive for photophobia. Negative for visual disturbance.  Respiratory: Negative for shortness of breath.   Cardiovascular: Negative for chest pain.  Gastrointestinal: Positive for nausea and vomiting.  Neurological: Positive for headaches. Negative for dizziness, seizures, syncope, facial asymmetry, speech difficulty and numbness.  Hematological: Does not bruise/bleed easily.     Physical Exam Updated Vital Signs BP 121/81 (BP Location: Right Arm)   Pulse 94   Temp 97.9 F (36.6 C) (Oral)   Resp 18  Ht 5\' 7"  (1.702 m) Comment: Simultaneous filing. User may not have seen previous data.  Wt 70.3 kg (155 lb) Comment: Simultaneous filing. User may not have seen previous data.  SpO2 98%   BMI 24.28 kg/m   Physical Exam   ED Treatments / Results  Labs (all labs ordered are listed, but only abnormal results are displayed) Labs Reviewed  CBG MONITORING, ED - Abnormal; Notable for the following components:      Result Value   Glucose-Capillary 173 (*)    All other components within normal limits    EKG None  Radiology No results found.  Procedures Procedures (including critical care time)  Medications Ordered in ED Medications  ondansetron (ZOFRAN-ODT) disintegrating tablet 4 mg (4 mg Oral Given 03/20/18 1850)     Initial Impression / Assessment and Plan / ED Course  I have reviewed the triage vital signs and the nursing notes.  Pertinent labs & imaging results that were available during my care of the patient were reviewed by me and considered in my medical decision making (see chart for details).  Clinical Course as of Mar 20 2028  Sat Mar 20, 2018  2021 Pt reassessed. Pt's VSS and WNL. Pt's cap refill < 3 seconds. Pt has been hydrated in the ER and now passed po challenge. We will discharge with antiemetic. Strict ER return precautions have been discussed and pt will return if he is unable to tolerate fluids and symptoms are getting worse.    [AN]  2023 Patient wants to go home now.  I am comfortable with him going home.  Strict ER return precautions have been discussed for worsening of headaches or any new neurologic symptoms.   [AN]  2029 Glucose-Capillary(!): 173 [AN]    Clinical Course User Index [AN] Varney Biles, MD    52 year old male comes in with chief complaint of persistent headache, difficulty with concentration, nausea and vomiting.  Patient has history of intractable nausea and vomiting due to multiple GI pathologies.  Patient's headache is frontal, not worse when he is supine -so it does not seem like he has elevated ICP.  Additionally, patient's fall was 2 days ago, and his symptoms are not progressing, the only reason he came to the ER was because the work sent him here.  Patient has been driving, ambulating, completing ADLs without significant problems -which further makes me think that patient likely does not have clinically significant brain bleed.  Besides subdural hematoma, subarachnoid hemorrhage -I am thinking patient likely has postconcussion syndrome.  We will start oral challenge here with Zofran and reassess. Blood sugar is 173, clinically no concerns for DKA.  Final Clinical Impressions(s) / ED Diagnoses   Final diagnoses:  Traumatic brain injury, without loss of consciousness, initial encounter [facility])  Post concussion syndrome    ED Discharge Orders        Ordered    ondansetron (ZOFRAN ODT) 4 MG disintegrating tablet  Every 8 hours PRN     03/20/18 2024       Varney Biles, MD 03/20/18 2026    Varney Biles, MD 03/20/18 2029

## 2018-03-20 NOTE — ED Triage Notes (Signed)
Patient fell while carrying water up ramp. Per patient fell forward and hit front of head. Patient states since fall nausea, vomiting, and lethargic. Denies LOC, dizziness, or blurred vision. Patient reports taking Vicodin this morning wit but vomited shortly after. Per patient neck and left side body pain. Denies taking any blood thinners.

## 2018-03-24 ENCOUNTER — Other Ambulatory Visit: Payer: Self-pay

## 2018-03-24 DIAGNOSIS — G894 Chronic pain syndrome: Secondary | ICD-10-CM

## 2018-03-24 MED ORDER — HYDROCODONE-ACETAMINOPHEN 5-325 MG PO TABS: 1.0000 | ORAL_TABLET | Freq: Three times a day (TID) | ORAL | 0 refills | Status: DC | PRN

## 2018-03-24 NOTE — Telephone Encounter (Signed)
Pt called nurse line requesting a refill on his pain medication. Please let him know when this has been sent in.

## 2018-04-20 ENCOUNTER — Other Ambulatory Visit: Payer: Self-pay

## 2018-04-20 ENCOUNTER — Other Ambulatory Visit: Payer: Self-pay | Admitting: Family Medicine

## 2018-04-20 DIAGNOSIS — G894 Chronic pain syndrome: Secondary | ICD-10-CM

## 2018-04-20 MED ORDER — HYDROCODONE-ACETAMINOPHEN 5-325 MG PO TABS: 1.0000 | ORAL_TABLET | Freq: Three times a day (TID) | ORAL | 0 refills | Status: DC | PRN

## 2018-04-20 NOTE — Telephone Encounter (Signed)
Pt requesting refill of hydrocodone to [facility]. Pt call back [phone] Wallace Cullens, RN

## 2018-05-20 ENCOUNTER — Other Ambulatory Visit: Payer: Self-pay

## 2018-05-20 DIAGNOSIS — G894 Chronic pain syndrome: Secondary | ICD-10-CM

## 2018-05-20 MED ORDER — HYDROCODONE-ACETAMINOPHEN 5-325 MG PO TABS: 1.0000 | ORAL_TABLET | Freq: Three times a day (TID) | ORAL | 0 refills | Status: DC | PRN

## 2018-06-21 ENCOUNTER — Other Ambulatory Visit: Payer: Self-pay

## 2018-06-21 DIAGNOSIS — G894 Chronic pain syndrome: Secondary | ICD-10-CM

## 2018-06-22 MED ORDER — HYDROCODONE-ACETAMINOPHEN 5-325 MG PO TABS: 1.0000 | ORAL_TABLET | Freq: Three times a day (TID) | ORAL | 0 refills | Status: DC | PRN

## 2018-07-19 ENCOUNTER — Other Ambulatory Visit: Payer: Self-pay

## 2018-07-19 DIAGNOSIS — G894 Chronic pain syndrome: Secondary | ICD-10-CM

## 2018-07-19 MED ORDER — OMEPRAZOLE 20 MG PO CPDR: 20.0000 mg | DELAYED_RELEASE_CAPSULE | Freq: Every day | ORAL | 1 refills | Status: DC

## 2018-07-19 MED ORDER — HYDROCODONE-ACETAMINOPHEN 5-325 MG PO TABS: 1.0000 | ORAL_TABLET | Freq: Three times a day (TID) | ORAL | 0 refills | Status: DC | PRN

## 2018-07-19 NOTE — Telephone Encounter (Signed)
Please inform patient that supply to last 30 days as preciously discuss. We will not be refilling medications early even for a day or two early. I will refill it this time but it need to last 30 days going forward.   Thanks  Marjie Skiff, MD [place], PGY-3

## 2018-07-19 NOTE — Telephone Encounter (Signed)
Pt called nurse line stating he needs a refill on the pended medications. Please advise.

## 2018-08-17 ENCOUNTER — Other Ambulatory Visit: Payer: Self-pay | Admitting: Family Medicine

## 2018-08-17 DIAGNOSIS — G894 Chronic pain syndrome: Secondary | ICD-10-CM

## 2018-08-17 NOTE — Telephone Encounter (Signed)
Pt would like to have his hydrocodone refilled.

## 2018-08-18 MED ORDER — HYDROCODONE-ACETAMINOPHEN 5-325 MG PO TABS: 1.0000 | ORAL_TABLET | Freq: Three times a day (TID) | ORAL | 0 refills | Status: DC | PRN

## 2018-09-16 ENCOUNTER — Other Ambulatory Visit: Payer: Self-pay

## 2018-09-16 DIAGNOSIS — G894 Chronic pain syndrome: Secondary | ICD-10-CM

## 2018-09-16 MED ORDER — HYDROCODONE-ACETAMINOPHEN 5-325 MG PO TABS: 1.0000 | ORAL_TABLET | Freq: Three times a day (TID) | ORAL | 0 refills | Status: DC | PRN

## 2018-10-01 ENCOUNTER — Ambulatory Visit (INDEPENDENT_AMBULATORY_CARE_PROVIDER_SITE_OTHER): Payer: Self-pay | Admitting: Family Medicine

## 2018-10-01 ENCOUNTER — Encounter: Payer: Self-pay | Admitting: Family Medicine

## 2018-10-01 VITALS — BP 114/70 | HR 96 | Temp 97.5°F | Ht 67.0 in | Wt 164.4 lb

## 2018-10-01 DIAGNOSIS — E119 Type 2 diabetes mellitus without complications: Secondary | ICD-10-CM

## 2018-10-01 DIAGNOSIS — I10 Essential (primary) hypertension: Secondary | ICD-10-CM

## 2018-10-01 DIAGNOSIS — E7800 Pure hypercholesterolemia, unspecified: Secondary | ICD-10-CM

## 2018-10-01 DIAGNOSIS — E785 Hyperlipidemia, unspecified: Secondary | ICD-10-CM

## 2018-10-01 LAB — POCT GLYCOSYLATED HEMOGLOBIN (HGB A1C): HbA1c, POC (controlled diabetic range): 8 % — AB (ref 0.0–7.0)

## 2018-10-01 MED ORDER — METFORMIN HCL 1000 MG PO TABS
1000.0000 mg | ORAL_TABLET | Freq: Two times a day (BID) | ORAL | 3 refills | Status: DC
Start: 2018-10-01 — End: 2019-01-07

## 2018-10-01 NOTE — Progress Notes (Signed)
Subjective:    Patient ID: [facility], male    DOB: June 13, 1966, 53 y.o.   MRN: [id number]   CC: high sugar and cholesterol  HPI: Patient is a 53 year old male with a complex past medical history who presents today to follow-up on type 2 diabetes management and hyperlipidemia.  Patient reports that he was at the health fair at [employer] where he works and was told he had elevated cholesterol and high blood glucose.  Patient has not been taking any metformin and was controlling his diabetes with diet.  He is currently on atorvastatin 80 mg daily.  Patient has not been seen in clinic for several months due to lack of insurance.  Patient reports he still does not have insurance.  He does not qualify for Medicaid and has tried to obtain coverage through [facility] but was denied. Patient currently denies any chest pain, shortness of breath, polyuria, polydipsia, abdominal pain, nausea, vomiting.  Smoking status reviewed   ROS: all other systems were reviewed and are negative other than in the HPI   Past Medical History:  Diagnosis Date  . Adrenal tumor    a. s/p adrenal gland resection at [facility]. left side removal per patient  . Anxiety   . Barrett's esophagus    EGD - 11/27/09 - short segment of Barrett's  . CAD (coronary artery disease)    a. 04/27/14 [place] s/p DES to mLAD and DES to mLCx  . Chronic back pain    upper and lower per patient  . Chronic chest pain   . Degenerative joint disease    Bilateral knees. Significant knee pain since playing football in high school. also in back per patient  . Depression   . Diabetes mellitus type 2, controlled ([place]) 08/29/2015   Diagnosed by A1c 7.6% during 08/26/15 hospital admission for chest pain  . Gastroesophageal reflux disease with hiatal hernia   . GERD (gastroesophageal reflux disease)   . Hiatal hernia    EGD - 11/27/2009  . Hyperlipidemia   . Hypertension   . Low back pain   . Primary hyperaldosteronism ([facility]) 01/22/2012   S/P adrenalectomy      . PTSD (post-traumatic stress disorder)   . Sleep apnea   . Stone, kidney     Past Surgical History:  Procedure Laterality Date  . ADRENALECTOMY  10/2013  . CARDIAC CATHETERIZATION  05/01/2014   Patent stents, other disease unchanged  . CARDIAC CATHETERIZATION  04/27/2014   Procedure: CORONARY STENT INTERVENTION;  Surgeon: Peter M Martinique, MD;  Location: [facility] CATH LAB;  Service: Cardiovascular;;  DES mid Cx  DES mid LAD  . CARDIAC CATHETERIZATION N/A 03/22/2015   Procedure: Left Heart Cath and Coronary Angiography;  Surgeon: Sherren Mocha, MD;  Location: [place] CV LAB;  Service: Cardiovascular;  Laterality: N/A;  . CORONARY ANGIOPLASTY WITH STENT PLACEMENT  04/27/2014   3.0 x 16 mm Promus DES to the mid LAD and 3.5 x 28 mm Promus to the mid LCx, otherwise 20-30 percent lesions, EF 55%  . KIDNEY STONE SURGERY  X 1  . KNEE ARTHROPLASTY Right 1984  . KNEE ARTHROSCOPY Right X 6  . LEFT HEART CATHETERIZATION WITH CORONARY ANGIOGRAM N/A 04/27/2014   Procedure: LEFT HEART CATHETERIZATION WITH CORONARY ANGIOGRAM;  Surgeon: Peter M Martinique, MD;  Location: [facility] CATH LAB;  Service: Cardiovascular;  Laterality: N/A;  . LEFT HEART CATHETERIZATION WITH CORONARY ANGIOGRAM N/A 05/01/2014   Procedure: LEFT HEART CATHETERIZATION WITH CORONARY ANGIOGRAM;  Surgeon: Burnell Blanks,  MD;  Location: [place] CATH LAB;  Service: Cardiovascular;  Laterality: N/A;  . LITHOTRIPSY  X 2    Past medical history, surgical, family, and social history reviewed and updated in the EMR as appropriate.  Objective:  BP 114/70   Pulse 96   Temp (!) 97.5 F (36.4 C) (Oral)   Ht 5\' 7"  (1.702 m)   Wt 164 lb 6 oz (74.6 kg)   SpO2 96%   BMI 25.74 kg/m   Vitals and nursing note reviewed  General: NAD, pleasant, able to participate in exam Cardiac: RRR, normal heart sounds, no murmurs. 2+ radial and PT pulses bilaterally Respiratory: CTAB, normal effort, No wheezes, rales or rhonchi Abdomen: soft, nontender,  nondistended, no hepatic or splenomegaly, +BS Extremities: no edema or cyanosis. WWP. Skin: warm and dry, no rashes noted Neuro: alert and oriented x4, no focal deficits Psych: Normal affect and mood   Assessment & Plan:    Essential hypertension BP today is 114/70 at goal.  Will continue metoprolol.  Diabetes mellitus type 2, controlled ([facility]) A1c today is 8.0 up from 6.9.  Patient has been attempting to control his diabetes with diet.  No results will start patient on metformin 1000 mg twice daily.  Given lack of insurance will refrain from adding another agent for the time being. --Follow-up on CMP, microalbumin/creatinine ratio, CBC  Hyperlipidemia Patient is currently on atorvastatin 80 mg, but he reports that cholesterol was 250 at the health fair.  Has not had a recent lipid panel. --Follow-up on lipid panel --Continue current medication regimen will titrate as needed  Patient has a history of Barrett's esophagus but has not been seen by GI in 3 years due to lack of insurance.  Will refer once he has an orange card or confidential assistance.  Patient will talk to front office to try to fill out application for orange card as well as chronic financial assistance.  He will follow-up in office.   Marjie Skiff, MD [place] PGY-3

## 2018-10-01 NOTE — Assessment & Plan Note (Signed)
Patient is currently on atorvastatin 80 mg, but he reports that cholesterol was 250 at the health fair.  Has not had a recent lipid panel. --Follow-up on lipid panel --Continue current medication regimen will titrate as needed

## 2018-10-01 NOTE — Patient Instructions (Signed)
It was great seeing you today! We have addressed the following issues today  1. I will do blood work today and will follow up on the results. 2. I am starting you on metformin 2000 mg a day 3. Talked to the front office about getting the orange card or cone financial assistance so we can sent you to your GI doctor.   If we did any lab work today, and the results require attention, either me or my nurse will get in touch with you. If everything is normal, you will get a letter in mail and a message via . If you don't hear from [place] in two weeks, please give [place] a call. Otherwise, we look forward to seeing you again at your next visit. If you have any questions or concerns before then, please call the clinic at [phone].  Please bring all your medications to every doctors visit  Sign up for My Chart to have easy access to your labs results, and communication with your Primary care physician. Please ask Front Desk for some assistance.   Please check-out at the front desk before leaving the clinic.    Take Care,   Dr. Andy Gauss

## 2018-10-01 NOTE — Assessment & Plan Note (Signed)
BP today is 114/70 at goal.  Will continue metoprolol.

## 2018-10-01 NOTE — Assessment & Plan Note (Signed)
A1c today is 8.0 up from 6.9.  Patient has been attempting to control his diabetes with diet.  No results will start patient on metformin 1000 mg twice daily.  Given lack of insurance will refrain from adding another agent for the time being. --Follow-up on CMP, microalbumin/creatinine ratio, CBC

## 2018-10-02 LAB — CBC WITH DIFFERENTIAL/PLATELET
BASOS ABS: 0.1 10*3/uL (ref 0.0–0.2)
Basos: 1 %
EOS (ABSOLUTE): 0.2 10*3/uL (ref 0.0–0.4)
Eos: 2 %
Hematocrit: 42.9 % (ref 37.5–51.0)
Hemoglobin: 14.4 g/dL (ref 13.0–17.7)
Immature Grans (Abs): 0.2 10*3/uL — ABNORMAL HIGH (ref 0.0–0.1)
Immature Granulocytes: 2 %
Lymphocytes Absolute: 2.3 10*3/uL (ref 0.7–3.1)
Lymphs: 21 %
MCH: 30.1 pg (ref 26.6–33.0)
MCHC: 33.6 g/dL (ref 31.5–35.7)
MCV: 90 fL (ref 79–97)
Monocytes Absolute: 0.5 10*3/uL (ref 0.1–0.9)
Monocytes: 5 %
NEUTROS ABS: 7.5 10*3/uL — AB (ref 1.4–7.0)
NEUTROS PCT: 69 %
Platelets: 251 10*3/uL (ref 150–450)
RBC: 4.79 x10E6/uL (ref 4.14–5.80)
RDW: 12.7 % (ref 11.6–15.4)
WBC: 10.7 10*3/uL (ref 3.4–10.8)

## 2018-10-02 LAB — CMP14+EGFR
ALT: 18 IU/L (ref 0–44)
AST: 17 IU/L (ref 0–40)
Albumin/Globulin Ratio: 1.9 (ref 1.2–2.2)
Albumin: 4.5 g/dL (ref 3.8–4.9)
Alkaline Phosphatase: 68 IU/L (ref 39–117)
BUN/Creatinine Ratio: 17 (ref 9–20)
BUN: 17 mg/dL (ref 6–24)
Bilirubin Total: 0.5 mg/dL (ref 0.0–1.2)
CALCIUM: 9.7 mg/dL (ref 8.7–10.2)
CHLORIDE: 99 mmol/L (ref 96–106)
CO2: 23 mmol/L (ref 20–29)
CREATININE: 0.98 mg/dL (ref 0.76–1.27)
GFR, EST AFRICAN AMERICAN: 102 mL/min/{1.73_m2} (ref >59)
GFR, EST NON AFRICAN AMERICAN: 88 mL/min/{1.73_m2} (ref >59)
GLUCOSE: 205 mg/dL — AB (ref 65–99)
Globulin, Total: 2.4 g/dL (ref 1.5–4.5)
Potassium: 4.6 mmol/L (ref 3.5–5.2)
Sodium: 137 mmol/L (ref 134–144)
TOTAL PROTEIN: 6.9 g/dL (ref 6.0–8.5)

## 2018-10-02 LAB — MICROALBUMIN / CREATININE URINE RATIO
Creatinine, Urine: 216 mg/dL
MICROALB/CREAT RATIO: 3 mg/g{creat} (ref 0–29)
MICROALBUM., U, RANDOM: 6.7 ug/mL

## 2018-10-02 LAB — LIPID PANEL
CHOL/HDL RATIO: 4.3 ratio (ref 0.0–5.0)
Cholesterol, Total: 235 mg/dL — ABNORMAL HIGH (ref 100–199)
HDL: 55 mg/dL (ref >39)
LDL CALC: 152 mg/dL — AB (ref 0–99)
Triglycerides: 138 mg/dL (ref 0–149)
VLDL CHOLESTEROL CAL: 28 mg/dL (ref 5–40)

## 2018-10-19 ENCOUNTER — Other Ambulatory Visit: Payer: Self-pay

## 2018-10-19 DIAGNOSIS — G894 Chronic pain syndrome: Secondary | ICD-10-CM

## 2018-10-19 MED ORDER — HYDROCODONE-ACETAMINOPHEN 5-325 MG PO TABS: 1.0000 | ORAL_TABLET | Freq: Three times a day (TID) | ORAL | 0 refills | Status: DC | PRN

## 2018-10-20 ENCOUNTER — Other Ambulatory Visit: Payer: Self-pay

## 2018-10-20 DIAGNOSIS — G894 Chronic pain syndrome: Secondary | ICD-10-CM

## 2018-10-20 MED ORDER — HYDROCODONE-ACETAMINOPHEN 5-325 MG PO TABS: 1.0000 | ORAL_TABLET | Freq: Three times a day (TID) | ORAL | 0 refills | Status: DC | PRN

## 2018-11-18 ENCOUNTER — Other Ambulatory Visit: Payer: Self-pay

## 2018-11-18 DIAGNOSIS — G894 Chronic pain syndrome: Secondary | ICD-10-CM

## 2018-11-18 MED ORDER — HYDROCODONE-ACETAMINOPHEN 5-325 MG PO TABS: 1.0000 | ORAL_TABLET | Freq: Three times a day (TID) | ORAL | 0 refills | Status: DC | PRN

## 2018-12-17 ENCOUNTER — Other Ambulatory Visit: Payer: Self-pay | Admitting: Family Medicine

## 2018-12-17 DIAGNOSIS — G894 Chronic pain syndrome: Secondary | ICD-10-CM

## 2018-12-17 MED ORDER — HYDROCODONE-ACETAMINOPHEN 5-325 MG PO TABS: 1.0000 | ORAL_TABLET | Freq: Three times a day (TID) | ORAL | 0 refills | Status: DC | PRN

## 2018-12-17 NOTE — Telephone Encounter (Signed)
Pt called to get refill on his VICODIN. Please give pt a call back.

## 2019-01-03 ENCOUNTER — Inpatient Hospital Stay (HOSPITAL_COMMUNITY)
Admission: EM | Admit: 2019-01-03 | Discharge: 2019-01-05 | DRG: 247 | Disposition: A | Discharge status: Home / Self Care | Payer: Self-pay | Attending: Family Medicine | Admitting: Family Medicine

## 2019-01-03 ENCOUNTER — Emergency Department (HOSPITAL_COMMUNITY): Payer: Self-pay

## 2019-01-03 ENCOUNTER — Other Ambulatory Visit: Payer: Self-pay

## 2019-01-03 ENCOUNTER — Encounter (HOSPITAL_COMMUNITY): Payer: Self-pay

## 2019-01-03 DIAGNOSIS — Z7984 Long term (current) use of oral hypoglycemic drugs: Secondary | ICD-10-CM

## 2019-01-03 DIAGNOSIS — I10 Essential (primary) hypertension: Secondary | ICD-10-CM | POA: Diagnosis present

## 2019-01-03 DIAGNOSIS — R079 Chest pain, unspecified: Secondary | ICD-10-CM

## 2019-01-03 DIAGNOSIS — R0789 Other chest pain: Secondary | ICD-10-CM

## 2019-01-03 DIAGNOSIS — F4310 Post-traumatic stress disorder, unspecified: Secondary | ICD-10-CM | POA: Diagnosis present

## 2019-01-03 DIAGNOSIS — Z8249 Family history of ischemic heart disease and other diseases of the circulatory system: Secondary | ICD-10-CM

## 2019-01-03 DIAGNOSIS — Z96651 Presence of right artificial knee joint: Secondary | ICD-10-CM | POA: Diagnosis present

## 2019-01-03 DIAGNOSIS — E119 Type 2 diabetes mellitus without complications: Secondary | ICD-10-CM

## 2019-01-03 DIAGNOSIS — F329 Major depressive disorder, single episode, unspecified: Secondary | ICD-10-CM | POA: Diagnosis present

## 2019-01-03 DIAGNOSIS — G4733 Obstructive sleep apnea (adult) (pediatric): Secondary | ICD-10-CM

## 2019-01-03 DIAGNOSIS — E2609 Other primary hyperaldosteronism: Secondary | ICD-10-CM | POA: Diagnosis present

## 2019-01-03 DIAGNOSIS — Z9114 Patient's other noncompliance with medication regimen: Secondary | ICD-10-CM

## 2019-01-03 DIAGNOSIS — G8929 Other chronic pain: Secondary | ICD-10-CM | POA: Diagnosis present

## 2019-01-03 DIAGNOSIS — E785 Hyperlipidemia, unspecified: Secondary | ICD-10-CM | POA: Diagnosis present

## 2019-01-03 DIAGNOSIS — Z91120 Patient's intentional underdosing of medication regimen due to financial hardship: Secondary | ICD-10-CM

## 2019-01-03 DIAGNOSIS — G2581 Restless legs syndrome: Secondary | ICD-10-CM | POA: Diagnosis present

## 2019-01-03 DIAGNOSIS — Z955 Presence of coronary angioplasty implant and graft: Secondary | ICD-10-CM

## 2019-01-03 DIAGNOSIS — G4730 Sleep apnea, unspecified: Secondary | ICD-10-CM | POA: Diagnosis present

## 2019-01-03 DIAGNOSIS — E1169 Type 2 diabetes mellitus with other specified complication: Secondary | ICD-10-CM | POA: Diagnosis present

## 2019-01-03 DIAGNOSIS — Z599 Problem related to housing and economic circumstances, unspecified: Secondary | ICD-10-CM

## 2019-01-03 DIAGNOSIS — I255 Ischemic cardiomyopathy: Secondary | ICD-10-CM | POA: Diagnosis present

## 2019-01-03 DIAGNOSIS — I5022 Chronic systolic (congestive) heart failure: Secondary | ICD-10-CM | POA: Diagnosis present

## 2019-01-03 DIAGNOSIS — E1140 Type 2 diabetes mellitus with diabetic neuropathy, unspecified: Secondary | ICD-10-CM | POA: Diagnosis present

## 2019-01-03 DIAGNOSIS — Z7982 Long term (current) use of aspirin: Secondary | ICD-10-CM

## 2019-01-03 DIAGNOSIS — I25110 Atherosclerotic heart disease of native coronary artery with unstable angina pectoris: Principal | ICD-10-CM | POA: Diagnosis present

## 2019-01-03 DIAGNOSIS — I252 Old myocardial infarction: Secondary | ICD-10-CM

## 2019-01-03 DIAGNOSIS — K219 Gastro-esophageal reflux disease without esophagitis: Secondary | ICD-10-CM

## 2019-01-03 DIAGNOSIS — E7800 Pure hypercholesterolemia, unspecified: Secondary | ICD-10-CM | POA: Diagnosis present

## 2019-01-03 DIAGNOSIS — Z79899 Other long term (current) drug therapy: Secondary | ICD-10-CM

## 2019-01-03 DIAGNOSIS — I110 Hypertensive heart disease with heart failure: Secondary | ICD-10-CM | POA: Diagnosis present

## 2019-01-03 DIAGNOSIS — K449 Diaphragmatic hernia without obstruction or gangrene: Secondary | ICD-10-CM | POA: Diagnosis present

## 2019-01-03 DIAGNOSIS — Z20828 Contact with and (suspected) exposure to other viral communicable diseases: Secondary | ICD-10-CM | POA: Diagnosis present

## 2019-01-03 DIAGNOSIS — I2510 Atherosclerotic heart disease of native coronary artery without angina pectoris: Secondary | ICD-10-CM | POA: Diagnosis present

## 2019-01-03 LAB — CBC WITH DIFFERENTIAL/PLATELET
Abs Immature Granulocytes: 0.15 10*3/uL — ABNORMAL HIGH (ref 0.00–0.07)
Basophils Absolute: 0.1 10*3/uL (ref 0.0–0.1)
Basophils Relative: 1 %
Eosinophils Absolute: 0.1 10*3/uL (ref 0.0–0.5)
Eosinophils Relative: 1 %
HCT: 41.9 % (ref 39.0–52.0)
Hemoglobin: 14.3 g/dL (ref 13.0–17.0)
Immature Granulocytes: 1 %
Lymphocytes Relative: 26 %
Lymphs Abs: 3.2 10*3/uL (ref 0.7–4.0)
MCH: 30.8 pg (ref 26.0–34.0)
MCHC: 34.1 g/dL (ref 30.0–36.0)
MCV: 90.1 fL (ref 80.0–100.0)
Monocytes Absolute: 0.7 10*3/uL (ref 0.1–1.0)
Monocytes Relative: 6 %
Neutro Abs: 8.2 10*3/uL — ABNORMAL HIGH (ref 1.7–7.7)
Neutrophils Relative %: 65 %
Platelets: 223 10*3/uL (ref 150–400)
RBC: 4.65 MIL/uL (ref 4.22–5.81)
RDW: 12.9 % (ref 11.5–15.5)
WBC: 12.4 10*3/uL — ABNORMAL HIGH (ref 4.0–10.5)
nRBC: 0 % (ref 0.0–0.2)

## 2019-01-03 LAB — TROPONIN I
Troponin I: <0.03 ng/mL (ref <0.03)
Troponin I: <0.03 ng/mL (ref <0.03)
Troponin I: <0.03 ng/mL (ref <0.03)

## 2019-01-03 LAB — BASIC METABOLIC PANEL
Anion gap: 10 (ref 5–15)
BUN: 20 mg/dL (ref 6–20)
CO2: 24 mmol/L (ref 22–32)
Calcium: 9.3 mg/dL (ref 8.9–10.3)
Chloride: 103 mmol/L (ref 98–111)
Creatinine, Ser: 0.9 mg/dL (ref 0.61–1.24)
GFR calc Af Amer: >60 mL/min (ref >60)
GFR calc non Af Amer: >60 mL/min (ref >60)
Glucose, Bld: 164 mg/dL — ABNORMAL HIGH (ref 70–99)
Potassium: 4.2 mmol/L (ref 3.5–5.1)
Sodium: 137 mmol/L (ref 135–145)

## 2019-01-03 LAB — GLUCOSE, CAPILLARY
Glucose-Capillary: 120 mg/dL — ABNORMAL HIGH (ref 70–99)
Glucose-Capillary: 175 mg/dL — ABNORMAL HIGH (ref 70–99)

## 2019-01-03 LAB — HEMOGLOBIN A1C
Hgb A1c MFr Bld: 7.3 % — ABNORMAL HIGH (ref 4.8–5.6)
Mean Plasma Glucose: 162.81 mg/dL

## 2019-01-03 LAB — SARS CORONAVIRUS 2 BY RT PCR (HOSPITAL ORDER, PERFORMED IN ~~LOC~~ HOSPITAL LAB): SARS Coronavirus 2: NEGATIVE

## 2019-01-03 MED ORDER — ACETAMINOPHEN 650 MG RE SUPP: 650.0000 mg | Freq: Four times a day (QID) | RECTAL | Status: DC | PRN

## 2019-01-03 MED ORDER — SODIUM CHLORIDE 0.9 % IV SOLN
INTRAVENOUS | Status: AC
  Administered 2019-01-03: 18:00:00 via INTRAVENOUS

## 2019-01-03 MED ORDER — METOPROLOL TARTRATE 25 MG PO TABS
25.0000 mg | ORAL_TABLET | Freq: Two times a day (BID) | ORAL | Status: DC
  Administered 2019-01-03 – 2019-01-05 (×4): 25 mg via ORAL
  Filled 2019-01-03 (×4): qty 1

## 2019-01-03 MED ORDER — MORPHINE SULFATE (PF) 2 MG/ML IV SOLN
1.0000 mg | INTRAVENOUS | Status: DC | PRN
  Administered 2019-01-03 – 2019-01-04 (×4): 1 mg via INTRAVENOUS
  Filled 2019-01-03 (×4): qty 1

## 2019-01-03 MED ORDER — ASPIRIN EC 81 MG PO TBEC
81.0000 mg | DELAYED_RELEASE_TABLET | Freq: Every day | ORAL | Status: DC
  Administered 2019-01-04: 81 mg via ORAL
  Filled 2019-01-03: qty 1

## 2019-01-03 MED ORDER — ACETAMINOPHEN 325 MG PO TABS: 650.0000 mg | ORAL_TABLET | Freq: Four times a day (QID) | ORAL | Status: DC | PRN

## 2019-01-03 MED ORDER — ATORVASTATIN CALCIUM 80 MG PO TABS
80.0000 mg | ORAL_TABLET | Freq: Every day | ORAL | Status: DC
  Administered 2019-01-03: 80 mg via ORAL
  Filled 2019-01-03 (×2): qty 2

## 2019-01-03 MED ORDER — INSULIN ASPART 100 UNIT/ML ~~LOC~~ SOLN
0.0000 [IU] | Freq: Every day | SUBCUTANEOUS | Status: DC
  Administered 2019-01-04: 2 [IU] via SUBCUTANEOUS

## 2019-01-03 MED ORDER — PANTOPRAZOLE SODIUM 40 MG PO TBEC
40.0000 mg | DELAYED_RELEASE_TABLET | Freq: Every day | ORAL | Status: DC
  Administered 2019-01-03 – 2019-01-05 (×2): 40 mg via ORAL
  Filled 2019-01-03 (×2): qty 1

## 2019-01-03 MED ORDER — ENOXAPARIN SODIUM 40 MG/0.4ML ~~LOC~~ SOLN
40.0000 mg | SUBCUTANEOUS | Status: DC
  Administered 2019-01-03 – 2019-01-04 (×2): 40 mg via SUBCUTANEOUS
  Filled 2019-01-03 (×2): qty 0.4

## 2019-01-03 MED ORDER — ONDANSETRON HCL 4 MG/2ML IJ SOLN: 4.0000 mg | Freq: Four times a day (QID) | INTRAMUSCULAR | Status: DC | PRN

## 2019-01-03 MED ORDER — MORPHINE SULFATE (PF) 4 MG/ML IV SOLN
4.0000 mg | Freq: Once | INTRAVENOUS | Status: AC
  Administered 2019-01-03: 4 mg via INTRAVENOUS
  Filled 2019-01-03: qty 1

## 2019-01-03 MED ORDER — INSULIN ASPART 100 UNIT/ML ~~LOC~~ SOLN
0.0000 [IU] | Freq: Three times a day (TID) | SUBCUTANEOUS | Status: DC
  Administered 2019-01-04 – 2019-01-05 (×2): 1 [IU] via SUBCUTANEOUS
  Administered 2019-01-05: 2 [IU] via SUBCUTANEOUS

## 2019-01-03 MED ORDER — ONDANSETRON HCL 4 MG PO TABS: 4.0000 mg | ORAL_TABLET | Freq: Four times a day (QID) | ORAL | Status: DC | PRN

## 2019-01-03 MED ORDER — TRAZODONE HCL 50 MG PO TABS: 25.0000 mg | ORAL_TABLET | Freq: Every evening | ORAL | Status: DC | PRN

## 2019-01-03 MED ORDER — ASPIRIN 81 MG PO CHEW
324.0000 mg | CHEWABLE_TABLET | Freq: Once | ORAL | Status: AC
  Administered 2019-01-03: 324 mg via ORAL
  Filled 2019-01-03: qty 4

## 2019-01-03 NOTE — ED Triage Notes (Addendum)
Pt reports CP that started yesterday and reports swelling to left upper arm and pain since this morning. REports having a syncopal episode while cooking yesterday . Per pt no seizure at that time States he has been vomiting today. Pt reports doing yardwork prior to chest pain starting

## 2019-01-03 NOTE — H&P (Signed)
History and Physical  Andrew [facility]erta:[id number] DOB: 11-16-1965 DOA: 01/03/2019  Referring physician: Archie Patten, MD   PCP: Marjie Skiff, MD   Chief Complaint: chest pain   HPI: Andrew [facility] is a 53 y.o. male non-smoker with known coronary artery disease who presented today with symptoms of chest pressure and pain in discomfort associated with mowing the lawn.  He said he had been cutting grass for approximately 30 minutes when he started to experience chest pain and pressure symptoms that radiated to the left arm.  The symptoms felt similar to how he felt with his prior MI.  The patient reports that he has been out of his cardiac medications due to lacking medical insurance.  He also reports that shortly after he had the symptoms he had a brief episode of syncope.  He says that he has no history of epilepsy.  He reports that he is unsure of how long he was down.  The patient says that he had no symptoms of palpitations.  He denies headaches.  He denies having cough and cold symptoms and shortness of breath.  He was tested for COVID-19 as negative.  ED course: The patient was initially admitted and had a troponin less than 0.03.  His blood pressure was 116/93.  He had a normal sinus rhythm on EKG.  He did have some EKG changes noted.  He had some ST depression in the inferior leads.  Cardiology was consulted to see him.  He is being admitted for observation, serial troponins and possible noninvasive stress testing  Review of Systems: All systems reviewed and apart from history of presenting illness, are negative.  Past Medical History:  Diagnosis Date  . Adrenal tumor    a. s/p adrenal gland resection at [facility]. left side removal per patient  . Anxiety   . Barrett's esophagus    EGD - 11/27/09 - short segment of Barrett's  . CAD (coronary artery disease)    a. 04/27/14 [place] s/p DES to mLAD and DES to mLCx  . Chronic back pain    upper and lower per patient  . Chronic chest pain   .  Degenerative joint disease    Bilateral knees. Significant knee pain since playing football in high school. also in back per patient  . Depression   . Diabetes mellitus type 2, controlled ([place]) 08/29/2015   Diagnosed by A1c 7.6% during 08/26/15 hospital admission for chest pain  . Gastroesophageal reflux disease with hiatal hernia   . GERD (gastroesophageal reflux disease)   . Hiatal hernia    EGD - 11/27/2009  . Hyperlipidemia   . Hypertension   . Low back pain   . Primary hyperaldosteronism ([place]) 01/22/2012   S/P adrenalectomy     . PTSD (post-traumatic stress disorder)   . Sleep apnea   . Stone, kidney    Past Surgical History:  Procedure Laterality Date  . ADRENALECTOMY  10/2013  . CARDIAC CATHETERIZATION  05/01/2014   Patent stents, other disease unchanged  . CARDIAC CATHETERIZATION  04/27/2014   Procedure: CORONARY STENT INTERVENTION;  Surgeon: Peter M Martinique, MD;  Location: [facility] CATH LAB;  Service: Cardiovascular;;  DES mid Cx  DES mid LAD  . CARDIAC CATHETERIZATION N/A 03/22/2015   Procedure: Left Heart Cath and Coronary Angiography;  Surgeon: Sherren Mocha, MD;  Location: [place] CV LAB;  Service: Cardiovascular;  Laterality: N/A;  . CORONARY ANGIOPLASTY WITH STENT PLACEMENT  04/27/2014   3.0 x 16 mm Promus DES to the  mid LAD and 3.5 x 28 mm Promus to the mid LCx, otherwise 20-30 percent lesions, EF 55%  . KIDNEY STONE SURGERY  X 1  . KNEE ARTHROPLASTY Right 1984  . KNEE ARTHROSCOPY Right X 6  . LEFT HEART CATHETERIZATION WITH CORONARY ANGIOGRAM N/A 04/27/2014   Procedure: LEFT HEART CATHETERIZATION WITH CORONARY ANGIOGRAM;  Surgeon: Peter M Martinique, MD;  Location: [facility] CATH LAB;  Service: Cardiovascular;  Laterality: N/A;  . LEFT HEART CATHETERIZATION WITH CORONARY ANGIOGRAM N/A 05/01/2014   Procedure: LEFT HEART CATHETERIZATION WITH CORONARY ANGIOGRAM;  Surgeon: Burnell Blanks, MD;  Location: [facility] CATH LAB;  Service: Cardiovascular;  Laterality: N/A;  . LITHOTRIPSY  X 2    Social History:  reports that he has never smoked. He has never used smokeless tobacco. He reports current alcohol use. He reports current drug use. Drug: Marijuana.  Allergies  Allergen Reactions  . Cortisone Acetate Swelling    Swelling at injection site  . Darvocet [Propoxyphene N-Acetaminophen] Nausea And Vomiting  . Nsaids Nausea And Vomiting and Other (See Comments)    Caused stomach ulcers in the past   . Xanax [Alprazolam] Other (See Comments)    Pt states causes him to be mean, irritable  . Tape Itching and Rash    Family History  Problem Relation Age of Onset  . Cancer Father        stomach cancer  . Diabetes Father   . Heart attack Paternal Grandfather   . Diabetes Paternal Grandmother     Prior to Admission medications   Medication Sig Start Date End Date Taking? Authorizing Provider  aspirin EC 81 MG tablet Take 81 mg by mouth daily.   Yes [provider]  atorvastatin (LIPITOR) 80 MG tablet Take 1 tablet (80 mg total) by mouth daily. 06/01/17  Yes Smiley Houseman, MD  HYDROcodone-acetaminophen (NORCO/VICODIN) 5-325 MG tablet Take 1-2 tablets by mouth every 8 (eight) hours as needed for moderate pain. 12/17/18  Yes Diallo, Earna Coder, MD  metFORMIN (GLUCOPHAGE) 1000 MG tablet Take 1 tablet (1,000 mg total) by mouth 2 (two) times daily with a meal. 10/01/18  Yes Diallo, Abdoulaye, MD  omeprazole (PRILOSEC) 20 MG capsule Take 1 capsule (20 mg total) by mouth daily. 07/19/18  Yes Diallo, Abdoulaye, MD  metoprolol tartrate (LOPRESSOR) 25 MG tablet Take 1 tablet (25 mg total) by mouth 2 (two) times daily. Patient not taking: Reported on 01/03/2019 06/01/17   Smiley Houseman, MD  ondansetron (ZOFRAN ODT) 4 MG disintegrating tablet Take 1 tablet (4 mg total) by mouth every 8 (eight) hours as needed for nausea or vomiting. Patient not taking: Reported on 01/03/2019 03/20/18   Varney Biles, MD   Physical Exam: Vitals:   01/03/19 1230 01/03/19 1300 01/03/19 1330  01/03/19 1400  BP: 131/86 (!) 117/93 (!) 139/108 (!) 140/92  Pulse: 82 78 83 79  Resp: 17 12 12 15   Temp:      TempSrc:      SpO2: 97% 97% 97% 97%  Weight:      Height:         General exam: Moderately built and nourished patient, lying comfortably supine on the gurney in no obvious distress.  Head, eyes and ENT: Nontraumatic and normocephalic. Pupils equally reacting to light and accommodation. Oral mucosa moist.  Neck: Supple. No JVD, carotid bruit or thyromegaly.  Lymphatics: No lymphadenopathy.  Respiratory system: Clear to auscultation. No increased work of breathing.  Cardiovascular system: normal S1 and S2 heard, RRR. No  JVD, murmurs, gallops, clicks or pedal edema.  Gastrointestinal system: Abdomen is nondistended, soft and nontender. Normal bowel sounds heard. No organomegaly or masses appreciated.  Central nervous system: Alert and oriented. No focal neurological deficits.  Extremities: Symmetric 5 x 5 power. Peripheral pulses symmetrically felt.   Skin: No rashes or acute findings.  Musculoskeletal system: Negative exam.  Psychiatry: Pleasant and cooperative.  Labs on Admission:  Basic Metabolic Panel: Recent Labs  Lab 01/03/19 1114  NA 137  K 4.2  CL 103  CO2 24  GLUCOSE 164*  BUN 20  CREATININE 0.90  CALCIUM 9.3   Liver Function Tests: No results for input(s): AST, ALT, ALKPHOS, BILITOT, PROT, ALBUMIN in the last 168 hours. No results for input(s): LIPASE, AMYLASE in the last 168 hours. No results for input(s): AMMONIA in the last 168 hours. CBC: Recent Labs  Lab 01/03/19 1114  WBC 12.4*  NEUTROABS 8.2*  HGB 14.3  HCT 41.9  MCV 90.1  PLT 223   Cardiac Enzymes: Recent Labs  Lab 01/03/19 1114  TROPONINI <0.03    BNP (last 3 results) No results for input(s): PROBNP in the last 8760 hours. CBG: No results for input(s): GLUCAP in the last 168 hours.  Radiological Exams on Admission: Dg Chest 2 View  Result Date: 01/03/2019  CLINICAL DATA:  Chest pain. EXAM: CHEST - 2 VIEW COMPARISON:  Radiographs of Jan 03, 2017. FINDINGS: The heart size and mediastinal contours are within normal limits. Both lungs are clear. No pneumothorax or pleural effusion is noted. The visualized skeletal structures are unremarkable. IMPRESSION: No active cardiopulmonary disease. Electronically Signed   By: Marijo Conception M.D.   On: 01/03/2019 11:50    EKG: Independently reviewed. ST depression inferior leads  Assessment/Plan Principal Problem:   Chest pain on exertion Active Problems:   Essential hypertension   Atypical chest pain   Restless legs syndrome   CAD- 2 V PCI Aug 2015   Hyperlipidemia   Gastroesophageal reflux disease with hiatal hernia   Primary hyperaldosteronism (HCC)   Apnea, sleep   Diabetes mellitus type 2, controlled ([place])   Diabetic neuropathy ([place])   Noncompliance w/medication treatment due to intermit use of medication   1. Chest pain on exertion- patient is being admitted for observation, serial troponin testing, continuous telemetry monitoring.  [place] cardiology consultation.  He likely will have a stress test in the a.m.  N.p.o. after midnight.  Check fasting lipid panel.  Patient says that he cannot tolerate nitroglycerin due to severe drop in blood pressure.  Check A1c.  Aspirin will be continued.  Restart atorvastatin.  Restart metoprolol. 2. Uncontrolled hypertension-patient has been off his cardiac medications for quite some time.  Restart metoprolol 25 mg twice daily. 3. Hyperlipidemia-check fasting lipid panel, resumed atorvastatin 80 mg. 4. GERD-Protonix ordered for GI protection. 5. Type 2 diabetes mellitus with neurological complications- sliding scale coverage ordered.  Carb modified diet.  Monitor CBG closely. 6. Sleep apnea- will offer CPAP while in hospital. 7. Noncompliance with medications- I have asked for the care management team to work with the patient to assist with medications and  will try to prescribe generic medications as possible.   DVT Prophylaxis: Lovenox Code Status: Full Family Communication: Patient updated at bedside Disposition Plan: Home tomorrow pending work-up  Time spent: 54-minutes  Irwin Brakeman, MD [facility] How to contact the [facility] Attending or Consulting provider [place] or covering provider during after hours [place], for this patient?  1. Check  the care team in [facility] and look for a) attending/consulting [place] provider listed and b) the [facility] team listed 2. Log into [email or web address] and use [place] universal password to access. If you do not have the password, please contact the hospital operator. 3. Locate the [facility] provider you are looking for under [facility] and page to a number that you can be directly reached. 4. If you still have difficulty reaching the provider, please page the [facility] (Director on Call) for the Hospitalists listed on amion for assistance.
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
## 2019-01-03 NOTE — ED Provider Notes (Signed)
[facility] EMERGENCY DEPARTMENT Provider Note   CSN: [id number] Arrival date & time: 01/03/19  1054    History   Chief Complaint Chief Complaint  Patient presents with  . Chest Pain    HPI Andrew Huerta is a 53 y.o. male.     HPI  53 year old male with chest pain. Past hx of coronary artery disease, chronic chest pain, hypertension, hyperlipidemia, prior resection of pheochromocytoma, Barrett's esophagus, gastroesophageal reflux disease for evaluation of chest pain. Patient had drug-eluting stent to his mid LAD and circumflex in August 2015. Nuclear study November 2015 showed ejection fraction 64% and no ischemia. Cardiac catheterization July 2016 showed a normal left main, 40% proximal LAD, patent stent in the mid LAD, patent stent in the circumflex with a 30% distal circumflex lesion. Ejection fraction 55-65%.    Symptoms began yesterday afternoon while mowing the lawn.  While mowing began to feel extremely tired, diaphoretic and nauseated.  Some vague chest discomfort but says he has chronic CP and didn't think much of this specifically. However, he was struck by how long it took him to "recover" when he was done.  He did begin feeling better though subsequently went outside to cook for his grandchild's second birthday.  While he was beside the grill he began to feel faint, very fatigued and nauseated.  Says he had a brief syncopal episode.  Since that time is continued to feel fatigue and some discomfort in his left upper extremity.  Denies any acute trauma or strain.  No fevers or chills.  No coughing. Has a known history of CAD.  He states that he is intermittently compliant with his medications.  He says he will take them for months at a time and then stop for months at a time because of financial constraints.  He has not been on them recently.  Past Medical History:  Diagnosis Date  . Adrenal tumor    a. s/p adrenal gland resection at [facility]. left side removal per patient  .  Anxiety   . Barrett's esophagus    EGD - 11/27/09 - short segment of Barrett's  . CAD (coronary artery disease)    a. 04/27/14 [place] s/p DES to mLAD and DES to mLCx  . Chronic back pain    upper and lower per patient  . Chronic chest pain   . Degenerative joint disease    Bilateral knees. Significant knee pain since playing football in high school. also in back per patient  . Depression   . Diabetes mellitus type 2, controlled ([place]) 08/29/2015   Diagnosed by A1c 7.6% during 08/26/15 hospital admission for chest pain  . Gastroesophageal reflux disease with hiatal hernia   . GERD (gastroesophageal reflux disease)   . Hiatal hernia    EGD - 11/27/2009  . Hyperlipidemia   . Hypertension   . Low back pain   . Primary hyperaldosteronism ([place]) 01/22/2012   S/P adrenalectomy     . PTSD (post-traumatic stress disorder)   . Sleep apnea   . Stone, kidney     Patient Active Problem List   Diagnosis Date Noted  . Diabetic neuropathy ([place]) 06/23/2017  . Skin abnormality 05/27/2017  . Seborrheic keratoses 05/20/2017  . Nevus, non-neoplastic 05/20/2017  . Renal cyst, right 11/12/2015  . Diabetes mellitus type 2, controlled ([place]) 08/29/2015  . Chronic back pain 04/27/2015  . Chest pain with moderate risk of acute coronary syndrome   . New onset seizure ([place]) 09/29/2014  . Neck pain 09/25/2014  .  Encounter for chronic pain management 08/09/2014  . CAD- 2 V PCI Aug 2015   . Hyperlipidemia   . Nephrolithiasis 10/12/2013  . Primary hyperaldosteronism ([place]) 09/13/2013  . Major depression ([place]) 07/26/2013  . Restless legs syndrome 01/05/2013  . PTSD (post-traumatic stress disorder) 05/07/2012  . Back pain, thoracic 04/14/2012  . Gastroesophageal reflux disease with hiatal hernia 02/05/2012  . Hypercholesterolemia 02/05/2012  . Herniation of nucleus pulposus 02/05/2012  . Apnea, sleep 02/05/2012  . Headache(784.0) 11/14/2011  . Atypical chest pain 08/14/2011  . Enlarged prostate 03/26/2011   . Osteoarthrosis involving lower leg 01/08/2010  . Essential hypertension 05/02/2008  . Barrett's esophagus 10/29/2006    Past Surgical History:  Procedure Laterality Date  . ADRENALECTOMY  10/2013  . CARDIAC CATHETERIZATION  05/01/2014   Patent stents, other disease unchanged  . CARDIAC CATHETERIZATION  04/27/2014   Procedure: CORONARY STENT INTERVENTION;  Surgeon: Peter M [place], MD;  Location: [facility] CATH LAB;  Service: Cardiovascular;;  DES mid Cx  DES mid LAD  . CARDIAC CATHETERIZATION N/A 03/22/2015   Procedure: Left Heart Cath and Coronary Angiography;  Surgeon: Sherren Mocha, MD;  Location: [place] CV LAB;  Service: Cardiovascular;  Laterality: N/A;  . CORONARY ANGIOPLASTY WITH STENT PLACEMENT  04/27/2014   3.0 x 16 mm Promus DES to the mid LAD and 3.5 x 28 mm Promus to the mid LCx, otherwise 20-30 percent lesions, EF 55%  . KIDNEY STONE SURGERY  X 1  . KNEE ARTHROPLASTY Right 1984  . KNEE ARTHROSCOPY Right X 6  . LEFT HEART CATHETERIZATION WITH CORONARY ANGIOGRAM N/A 04/27/2014   Procedure: LEFT HEART CATHETERIZATION WITH CORONARY ANGIOGRAM;  Surgeon: Peter M [place], MD;  Location: [facility] CATH LAB;  Service: Cardiovascular;  Laterality: N/A;  . LEFT HEART CATHETERIZATION WITH CORONARY ANGIOGRAM N/A 05/01/2014   Procedure: LEFT HEART CATHETERIZATION WITH CORONARY ANGIOGRAM;  Surgeon: Burnell Blanks, MD;  Location: [facility] CATH LAB;  Service: Cardiovascular;  Laterality: N/A;  . LITHOTRIPSY  X 2        Home Medications    Prior to Admission medications   Medication Sig Start Date End Date Taking? Authorizing Provider  aspirin EC 81 MG tablet Take 81 mg by mouth daily.    [provider]  atorvastatin (LIPITOR) 80 MG tablet Take 1 tablet (80 mg total) by mouth daily. 06/01/17   Smiley Houseman, MD  HYDROcodone-acetaminophen (NORCO/VICODIN) 5-325 MG tablet Take 1-2 tablets by mouth every 8 (eight) hours as needed for moderate pain. 12/17/18   Diallo, Earna Coder, MD   metFORMIN (GLUCOPHAGE) 1000 MG tablet Take 1 tablet (1,000 mg total) by mouth 2 (two) times daily with a meal. 10/01/18   Diallo, Abdoulaye, MD  metoprolol tartrate (LOPRESSOR) 25 MG tablet Take 1 tablet (25 mg total) by mouth 2 (two) times daily. Patient taking differently: Take 12.5 mg by mouth 2 (two) times daily.  06/01/17   Smiley Houseman, MD  omeprazole (PRILOSEC) 20 MG capsule Take 1 capsule (20 mg total) by mouth daily. 07/19/18   Diallo, Earna Coder, MD  ondansetron (ZOFRAN ODT) 4 MG disintegrating tablet Take 1 tablet (4 mg total) by mouth every 8 (eight) hours as needed for nausea or vomiting. 03/20/18   Varney Biles, MD    Family History Family History  Problem Relation Age of Onset  . Cancer Father        stomach cancer  . Diabetes Father   . Heart attack Paternal Grandfather   . Diabetes Paternal Grandmother  Social History Social History   Tobacco Use  . Smoking status: Never Smoker  . Smokeless tobacco: Never Used  Substance Use Topics  . Alcohol use: Yes    Alcohol/week: 0.0 standard drinks    Comment: "seldom" per patient  . Drug use: Yes    Types: Marijuana     Allergies   Cortisone acetate; Darvocet [propoxyphene n-acetaminophen]; Nsaids; Xanax [alprazolam]; and Tape   Review of Systems Review of Systems  All systems reviewed and negative, other than as noted in HPI. Physical Exam Updated Vital Signs BP (!) 125/92 (BP Location: Left Arm)   Pulse 92   Temp 97.7 F (36.5 C) (Oral)   Resp 17   Ht 5\' 7"  (1.702 m)   Wt 71.2 kg   BMI 24.59 kg/m   Physical Exam Vitals signs and nursing note reviewed.  Constitutional:      General: He is not in acute distress.    Appearance: He is well-developed.  HENT:     Head: Normocephalic and atraumatic.  Eyes:     General:        Right eye: No discharge.        Left eye: No discharge.     Conjunctiva/sclera: Conjunctivae normal.  Neck:     Musculoskeletal: Neck supple.  Cardiovascular:      Rate and Rhythm: Normal rate and regular rhythm.     Heart sounds: Normal heart sounds. No murmur. No friction rub. No gallop.   Pulmonary:     Effort: Pulmonary effort is normal. No respiratory distress.     Breath sounds: Normal breath sounds.  Abdominal:     General: There is no distension.     Palpations: Abdomen is soft.     Tenderness: There is no abdominal tenderness.  Musculoskeletal:        General: No tenderness.  Skin:    General: Skin is warm and dry.  Neurological:     Mental Status: He is alert.  Psychiatric:        Behavior: Behavior normal.        Thought Content: Thought content normal.      ED Treatments / Results  Labs (all labs ordered are listed, but only abnormal results are displayed) Labs Reviewed  CBC WITH DIFFERENTIAL/PLATELET - Abnormal; Notable for the following components:      Result Value   WBC 12.4 (*)    Neutro Abs 8.2 (*)    Abs Immature Granulocytes 0.15 (*)    All other components within normal limits  BASIC METABOLIC PANEL - Abnormal; Notable for the following components:   Glucose, Bld 164 (*)    All other components within normal limits  TROPONIN I    EKG EKG Interpretation  Date/Time:  Monday Jan 03 2019 11:07:22 EDT Ventricular Rate:  87 PR Interval:    QRS Duration: 92 QT Interval:  365 QTC Calculation: 440 R Axis:   81 Text Interpretation:  Sinus rhythm Inferior infarct, age indeterminate, but new from prior tracings Confirmed by Virgel Manifold [phone]) on 01/03/2019 11:12:22 AM   Radiology Dg Chest 2 View  Result Date: 01/03/2019 CLINICAL DATA:  Chest pain. EXAM: CHEST - 2 VIEW COMPARISON:  Radiographs of Jan 03, 2017. FINDINGS: The heart size and mediastinal contours are within normal limits. Both lungs are clear. No pneumothorax or pleural effusion is noted. The visualized skeletal structures are unremarkable. IMPRESSION: No active cardiopulmonary disease. Electronically Signed   By: Marijo Conception M.D.   On: 01/03/2019  11:50    Procedures Procedures (including critical care time)  Medications Ordered in ED Medications  aspirin chewable tablet 324 mg (324 mg Oral Given 01/03/19 1121)     Initial Impression / Assessment and Plan / ED Course  I have reviewed the triage vital signs and the nursing notes.  Pertinent labs & imaging results that were available during my care of the patient were reviewed by me and considered in my medical decision making (see chart for details).  52yM with constellation of symptoms with both typical and atypical features of ACS.  EKG is also changed from prior with new q waves inferiorly, new TWI II, aVF and flattening v5/6. If event yesterday was an MI though, troponin should be significantly elevated at this point and it is not.   He has known CAD, he has been off his meds and getting an outpt appointment expeditiously may be difficult in the current climate. I think would be prudent to admit and ultimately that he needs provocative testing.   Final Clinical Impressions(s) / ED Diagnoses   Final diagnoses:  Chest pain, unspecified type    ED Discharge Orders    None       Virgel Manifold, MD 01/03/19 1357

## 2019-01-03 NOTE — ED Notes (Signed)
Patient transported to X-ray 

## 2019-01-03 NOTE — ED Notes (Signed)
Patient back from x-ray 

## 2019-01-04 ENCOUNTER — Observation Stay (HOSPITAL_COMMUNITY): Payer: Self-pay

## 2019-01-04 ENCOUNTER — Observation Stay (HOSPITAL_BASED_OUTPATIENT_CLINIC_OR_DEPARTMENT_OTHER): Payer: Self-pay

## 2019-01-04 ENCOUNTER — Encounter (HOSPITAL_COMMUNITY)
Admission: EM | Disposition: A | Discharge status: Home / Self Care | Payer: Self-pay | Source: Home / Self Care | Attending: Family Medicine

## 2019-01-04 DIAGNOSIS — I371 Nonrheumatic pulmonary valve insufficiency: Secondary | ICD-10-CM

## 2019-01-04 DIAGNOSIS — I25110 Atherosclerotic heart disease of native coronary artery with unstable angina pectoris: Principal | ICD-10-CM

## 2019-01-04 DIAGNOSIS — I361 Nonrheumatic tricuspid (valve) insufficiency: Secondary | ICD-10-CM

## 2019-01-04 HISTORY — PX: CORONARY STENT INTERVENTION: CATH118234

## 2019-01-04 HISTORY — PX: LEFT HEART CATH AND CORONARY ANGIOGRAPHY: CATH118249

## 2019-01-04 LAB — GLUCOSE, CAPILLARY
Glucose-Capillary: 147 mg/dL — ABNORMAL HIGH (ref 70–99)
Glucose-Capillary: 104 mg/dL — ABNORMAL HIGH (ref 70–99)
Glucose-Capillary: 75 mg/dL (ref 70–99)
Glucose-Capillary: 217 mg/dL — ABNORMAL HIGH (ref 70–99)

## 2019-01-04 LAB — CBC
HCT: 40.5 % (ref 39.0–52.0)
Hemoglobin: 13.5 g/dL (ref 13.0–17.0)
MCH: 30.4 pg (ref 26.0–34.0)
MCHC: 33.3 g/dL (ref 30.0–36.0)
MCV: 91.2 fL (ref 80.0–100.0)
Platelets: 203 10*3/uL (ref 150–400)
RBC: 4.44 MIL/uL (ref 4.22–5.81)
RDW: 13 % (ref 11.5–15.5)
WBC: 12.4 10*3/uL — ABNORMAL HIGH (ref 4.0–10.5)
nRBC: 0 % (ref 0.0–0.2)

## 2019-01-04 LAB — POCT ACTIVATED CLOTTING TIME
Activated Clotting Time: 279 seconds
Activated Clotting Time: 301 seconds
Activated Clotting Time: 417 seconds

## 2019-01-04 LAB — BASIC METABOLIC PANEL
Anion gap: 7 (ref 5–15)
BUN: 16 mg/dL (ref 6–20)
CO2: 24 mmol/L (ref 22–32)
Calcium: 8.7 mg/dL — ABNORMAL LOW (ref 8.9–10.3)
Chloride: 106 mmol/L (ref 98–111)
Creatinine, Ser: 0.85 mg/dL (ref 0.61–1.24)
GFR calc Af Amer: >60 mL/min (ref >60)
GFR calc non Af Amer: >60 mL/min (ref >60)
Glucose, Bld: 127 mg/dL — ABNORMAL HIGH (ref 70–99)
Potassium: 4.3 mmol/L (ref 3.5–5.1)
Sodium: 137 mmol/L (ref 135–145)

## 2019-01-04 LAB — LIPID PANEL
Cholesterol: 230 mg/dL — ABNORMAL HIGH (ref 0–200)
HDL: 44 mg/dL (ref >40)
LDL Cholesterol: 156 mg/dL — ABNORMAL HIGH (ref 0–99)
Total CHOL/HDL Ratio: 5.2 RATIO
Triglycerides: 150 mg/dL — ABNORMAL HIGH (ref <150)
VLDL: 30 mg/dL (ref 0–40)

## 2019-01-04 LAB — ECHOCARDIOGRAM LIMITED
Height: 67 in
Weight: 2512 oz

## 2019-01-04 LAB — HIV ANTIBODY (ROUTINE TESTING W REFLEX): HIV Screen 4th Generation wRfx: NONREACTIVE

## 2019-01-04 LAB — TROPONIN I: Troponin I: <0.03 ng/mL (ref <0.03)

## 2019-01-04 SURGERY — LEFT HEART CATH AND CORONARY ANGIOGRAPHY
Anesthesia: LOCAL

## 2019-01-04 MED ORDER — SODIUM CHLORIDE 0.9 % IV SOLN
INTRAVENOUS | Status: AC | PRN
  Administered 2019-01-04: 100 mL/h via INTRAVENOUS

## 2019-01-04 MED ORDER — SODIUM CHLORIDE 0.9% FLUSH: 3.0000 mL | INTRAVENOUS | Status: DC | PRN

## 2019-01-04 MED ORDER — ATORVASTATIN CALCIUM 80 MG PO TABS
80.0000 mg | ORAL_TABLET | Freq: Every day | ORAL | Status: DC
  Administered 2019-01-05: 80 mg via ORAL
  Filled 2019-01-04: qty 1

## 2019-01-04 MED ORDER — FENTANYL CITRATE (PF) 100 MCG/2ML IJ SOLN
INTRAMUSCULAR | Status: AC
  Filled 2019-01-04: qty 2

## 2019-01-04 MED ORDER — SODIUM CHLORIDE 0.9 % IV SOLN: INTRAVENOUS | Status: AC

## 2019-01-04 MED ORDER — SODIUM CHLORIDE 0.9 % IV SOLN: 250.0000 mL | INTRAVENOUS | Status: DC | PRN

## 2019-01-04 MED ORDER — ONDANSETRON HCL 4 MG/2ML IJ SOLN: 4.0000 mg | Freq: Four times a day (QID) | INTRAMUSCULAR | Status: DC | PRN

## 2019-01-04 MED ORDER — VERAPAMIL HCL 2.5 MG/ML IV SOLN
INTRAVENOUS | Status: AC
  Filled 2019-01-04: qty 2

## 2019-01-04 MED ORDER — SODIUM CHLORIDE 0.9 % IV SOLN
INTRAVENOUS | Status: AC
  Administered 2019-01-05: 06:00:00 via INTRAVENOUS

## 2019-01-04 MED ORDER — NITROGLYCERIN 1 MG/10 ML FOR IR/CATH LAB
INTRA_ARTERIAL | Status: DC | PRN
  Administered 2019-01-04 (×3): 100 ug via INTRACORONARY

## 2019-01-04 MED ORDER — LIDOCAINE HCL (PF) 1 % IJ SOLN
INTRAMUSCULAR | Status: AC
  Filled 2019-01-04: qty 30

## 2019-01-04 MED ORDER — MIDAZOLAM HCL 2 MG/2ML IJ SOLN
INTRAMUSCULAR | Status: DC | PRN
  Administered 2019-01-04: 2 mg via INTRAVENOUS
  Administered 2019-01-04 (×3): 1 mg via INTRAVENOUS

## 2019-01-04 MED ORDER — TICAGRELOR 90 MG PO TABS
ORAL_TABLET | ORAL | Status: DC | PRN
  Administered 2019-01-04: 180 mg via ORAL

## 2019-01-04 MED ORDER — LIDOCAINE HCL (PF) 1 % IJ SOLN
INTRAMUSCULAR | Status: DC | PRN
  Administered 2019-01-04: 2 mL

## 2019-01-04 MED ORDER — NITROGLYCERIN 1 MG/10 ML FOR IR/CATH LAB
INTRA_ARTERIAL | Status: AC
  Filled 2019-01-04: qty 10

## 2019-01-04 MED ORDER — SODIUM CHLORIDE 0.9 % IV SOLN
INTRAVENOUS | Status: DC
  Administered 2019-01-04: 12:00:00 via INTRAVENOUS

## 2019-01-04 MED ORDER — LABETALOL HCL 5 MG/ML IV SOLN: 10.0000 mg | INTRAVENOUS | Status: AC | PRN

## 2019-01-04 MED ORDER — MIDAZOLAM HCL 5 MG/5ML IJ SOLN
INTRAMUSCULAR | Status: AC
  Filled 2019-01-04: qty 5

## 2019-01-04 MED ORDER — SODIUM CHLORIDE 0.9% FLUSH: 3.0000 mL | Freq: Two times a day (BID) | INTRAVENOUS | Status: DC

## 2019-01-04 MED ORDER — TICAGRELOR 90 MG PO TABS
ORAL_TABLET | ORAL | Status: AC
  Filled 2019-01-04: qty 2

## 2019-01-04 MED ORDER — TICAGRELOR 90 MG PO TABS
90.0000 mg | ORAL_TABLET | Freq: Two times a day (BID) | ORAL | Status: DC
  Administered 2019-01-05 (×2): 90 mg via ORAL
  Filled 2019-01-04 (×2): qty 1

## 2019-01-04 MED ORDER — HEPARIN (PORCINE) IN NACL 1000-0.9 UT/500ML-% IV SOLN
INTRAVENOUS | Status: AC
  Filled 2019-01-04: qty 1000

## 2019-01-04 MED ORDER — HEPARIN (PORCINE) IN NACL 1000-0.9 UT/500ML-% IV SOLN
INTRAVENOUS | Status: DC | PRN
  Administered 2019-01-04 (×3): 500 mL

## 2019-01-04 MED ORDER — IOHEXOL 350 MG/ML SOLN
INTRAVENOUS | Status: DC | PRN
  Administered 2019-01-04: 19:00:00 170 mL via INTRA_ARTERIAL

## 2019-01-04 MED ORDER — VERAPAMIL HCL 2.5 MG/ML IV SOLN
INTRAVENOUS | Status: DC | PRN
  Administered 2019-01-04: 17:00:00 10 mL via INTRA_ARTERIAL

## 2019-01-04 MED ORDER — MIDAZOLAM HCL 2 MG/2ML IJ SOLN
INTRAMUSCULAR | Status: AC
  Filled 2019-01-04: qty 2

## 2019-01-04 MED ORDER — MORPHINE SULFATE (PF) 2 MG/ML IV SOLN
INTRAVENOUS | Status: AC
  Filled 2019-01-04: qty 1

## 2019-01-04 MED ORDER — ASPIRIN 81 MG PO CHEW
81.0000 mg | CHEWABLE_TABLET | Freq: Every day | ORAL | Status: DC
  Administered 2019-01-05: 81 mg via ORAL
  Filled 2019-01-04: qty 1

## 2019-01-04 MED ORDER — ACETAMINOPHEN 325 MG PO TABS: 650.0000 mg | ORAL_TABLET | ORAL | Status: DC | PRN

## 2019-01-04 MED ORDER — FENTANYL CITRATE (PF) 100 MCG/2ML IJ SOLN
INTRAMUSCULAR | Status: DC | PRN
  Administered 2019-01-04 (×4): 25 ug via INTRAVENOUS

## 2019-01-04 MED ORDER — HEPARIN SODIUM (PORCINE) 1000 UNIT/ML IJ SOLN
INTRAMUSCULAR | Status: AC
  Filled 2019-01-04: qty 1

## 2019-01-04 MED ORDER — HYDRALAZINE HCL 20 MG/ML IJ SOLN: 10.0000 mg | INTRAMUSCULAR | Status: AC | PRN

## 2019-01-04 MED ORDER — HEPARIN SODIUM (PORCINE) 1000 UNIT/ML IJ SOLN
INTRAMUSCULAR | Status: DC | PRN
  Administered 2019-01-04: 4000 [IU] via INTRAVENOUS
  Administered 2019-01-04: 3500 [IU] via INTRAVENOUS
  Administered 2019-01-04: 2000 [IU] via INTRAVENOUS
  Administered 2019-01-04: 1000 [IU] via INTRAVENOUS

## 2019-01-04 SURGICAL SUPPLY — 18 items
BALLN SAPPHIRE 2.0X12 (BALLOONS) ×2
BALLN SAPPHIRE ~~LOC~~ 2.5X15 (BALLOONS) ×2 IMPLANT
BALLOON SAPPHIRE 2.0X12 (BALLOONS) ×1 IMPLANT
CATH OPTITORQUE TIG 4.0 5F (CATHETERS) ×2 IMPLANT
CATH VISTA GUIDE 6FR XBLAD3.5 (CATHETERS) ×2 IMPLANT
DEVICE RAD COMP TR BAND LRG (VASCULAR PRODUCTS) ×2 IMPLANT
GLIDESHEATH SLEND SS 6F .021 (SHEATH) ×2 IMPLANT
GUIDEWIRE INQWIRE 1.5J.035X260 (WIRE) ×1 IMPLANT
INQWIRE 1.5J .035X260CM (WIRE) ×2
KIT ENCORE 26 ADVANTAGE (KITS) ×2 IMPLANT
KIT HEART LEFT (KITS) ×2 IMPLANT
PACK CARDIAC CATHETERIZATION (CUSTOM PROCEDURE TRAY) ×2 IMPLANT
SHEATH PROBE COVER 6X72 (BAG) ×2 IMPLANT
STENT RESOLUTE ONYX 2.5X18 (Permanent Stent) ×2 IMPLANT
TRANSDUCER W/STOPCOCK (MISCELLANEOUS) ×2 IMPLANT
TUBING CIL FLEX 10 FLL-RA (TUBING) ×2 IMPLANT
WIRE ASAHI PROWATER 180CM (WIRE) ×2 IMPLANT
WIRE HI TORQ BMW 190CM (WIRE) ×2 IMPLANT

## 2019-01-04 NOTE — Progress Notes (Signed)
*  PRELIMINARY RESULTS* Echocardiogram 2D Echocardiogram LIMITED has been performed.  Leavy Cella 01/04/2019, 9:47 AM

## 2019-01-04 NOTE — Progress Notes (Signed)
Echo shows EF 35-40% with new inferior wall motion abnormality This correlates with ECG changes  Although troponin is negative he has had a subacute MI Transfer to [facility] for cath today   [employer]

## 2019-01-04 NOTE — Progress Notes (Signed)
Progress Note  Patient Name: Andrew Huerta Date of Encounter: 01/04/2019  Primary Cardiologist: [place]  Subjective   Weak with some lightheaded ness and SSCP not radiating to arm Mild nausea Has not eaten this am  Inpatient Medications    Scheduled Meds: . aspirin EC  81 mg Oral Daily  . atorvastatin  80 mg Oral q1800  . enoxaparin (LOVENOX) injection  40 mg Subcutaneous Q24H  . insulin aspart  0-5 Units Subcutaneous QHS  . insulin aspart  0-9 Units Subcutaneous TID WC  . metoprolol tartrate  25 mg Oral BID  . pantoprazole  40 mg Oral Daily   Continuous Infusions: . sodium chloride 50 mL/hr at 01/03/19 1743   PRN Meds: acetaminophen **OR** acetaminophen, morphine injection, ondansetron **OR** ondansetron (ZOFRAN) IV, traZODone   Vital Signs    Vitals:   01/03/19 1530 01/03/19 1600 01/03/19 2101 01/04/19 0526  BP: (!) 139/92 124/84 (!) 123/94 101/73  Pulse: 72 80 76 67  Resp: 11  18 16   Temp:  97.7 F (36.5 C) 97.6 F (36.4 C) (!) 97.4 F (36.3 C)  TempSrc:  Oral Oral Oral  SpO2: 98% 99% 98% 99%  Weight:      Height:        Intake/Output Summary (Last 24 hours) at 01/04/2019 0842 Last data filed at 01/04/2019 0400 Gross per 24 hour  Intake 754.17 ml  Output 500 ml  Net 254.17 ml   Last 3 Weights 01/03/2019 10/01/2018 03/20/2018  Weight (lbs) 157 lb 164 lb 6 oz 155 lb  Weight (kg) 71.215 kg 74.56 kg 70.308 kg      Telemetry    NSR  - Personally Reviewed  ECG    SR new changes of inferior infarct compared to a year ago - Personally Reviewed  Physical Exam   GEN: No acute distress.   Neck: No JVD Cardiac: RRR, no murmurs, rubs, or gallops.  Respiratory: Clear to auscultation bilaterally. GI: Soft, nontender, non-distended  MS: No edema; No deformity. Neuro:  Nonfocal  Psych: Normal affect   Labs    Chemistry Recent Labs  Lab 01/03/19 1114 01/04/19 0238  NA 137 137  K 4.2 4.3  CL 103 106  CO2 24 24  GLUCOSE 164* 127*  BUN 20 16   CREATININE 0.90 0.85  CALCIUM 9.3 8.7*  GFRNONAA >60 >60  GFRAA >60 >60  ANIONGAP 10 7     Hematology Recent Labs  Lab 01/03/19 1114 01/04/19 0238  WBC 12.4* 12.4*  RBC 4.65 4.44  HGB 14.3 13.5  HCT 41.9 40.5  MCV 90.1 91.2  MCH 30.8 30.4  MCHC 34.1 33.3  RDW 12.9 13.0  PLT 223 203    Cardiac Enzymes Recent Labs  Lab 01/03/19 1114 01/03/19 1456 01/03/19 2036 01/04/19 0238  TROPONINI <0.03 <0.03 <0.03 <0.03   No results for input(s): TROPIPOC in the last 168 hours.   BNPNo results for input(s): BNP, PROBNP in the last 168 hours.   DDimer No results for input(s): DDIMER in the last 168 hours.   Radiology    Dg Chest 2 View  Result Date: 01/03/2019 CLINICAL DATA:  Chest pain. EXAM: CHEST - 2 VIEW COMPARISON:  Radiographs of Jan 03, 2017. FINDINGS: The heart size and mediastinal contours are within normal limits. Both lungs are clear. No pneumothorax or pleural effusion is noted. The visualized skeletal structures are unremarkable. IMPRESSION: No active cardiopulmonary disease. Electronically Signed   By: Marijo Conception M.D.   On: 01/03/2019  11:64    Cardiac Studies   Myovue Ordered Cath 2016 patent mid circumflex stent patent LAD stent 40% residual stenosis   Patient Profile     53 y.o. male admitted with SSCP and weakness History of stents to LAD/Circumflex patent by cath 2016  Assessment & Plan    1. SSCP:  Chronic daily pain for years. ECG does have new inferior changes but pain is atypical he looks good this am and troponin negative x 2.  Will order lexiscan myouvue for today will need transfer to [facility] for cath if shows ischemia or even new inferior wall infarct 2. HLD:  On statin labs with PJ 3. HTN:  Well controlled.  Continue current medications and low sodium Dash type diet.   4. DM:  Discussed low carb diet.  Target hemoglobin A1c is 6.5 or less.  Continue current medications. 5. Nausea: improved seems like he got overheated yesterday with some  dehydration has had saline continue protonix  Can be d/c latter today if myovue normal I will read latter this afternoon when done      For questions or updates, please contact [place] Please consult [email or web address] for contact info under        Signed, Jenkins Rouge, MD  01/04/2019, 8:42 AM

## 2019-01-04 NOTE — Progress Notes (Signed)
Pt to room for cath procedure via stretcher

## 2019-01-04 NOTE — TOC Initial Note (Signed)
Transition of Care [facility]) - Initial/Assessment Note    Patient Details  Name: Andrew Huerta MRN: [id number] Date of Birth: [birth year]  [facility]) CM/SW Contact:    Ihor Gully, LCSW Phone Number: 01/04/2019, 11:42 AM  Clinical Narrative:             Patient states that since his mother died in 2023-03-16 and he assumed house payments. He states that his wife of 26 years died in November 15, 2022. 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 Patient's current listed medications are all on the [employer] $4 list (Vicodin is not). Patient states that he takes his Vicodin as prescribed, but takes his Metoproal and Metformin every other month due to affordability.         Expected Discharge Plan: Acute to Acute Transfer Barriers to Discharge: No Barriers Identified   Patient Goals and CMS Choice Patient states their goals for this hospitalization and ongoing recovery are:: To return home and be well.       Expected Discharge Plan and Services Expected Discharge Plan: Acute to Acute Transfer In-house Referral: Clinical Social Work       Expected Discharge Date: 01/04/19                                    Prior Living Arrangements/Services     Patient language and need for interpreter reviewed:: No Do you feel safe going back to the place where you live?: Yes      Need for Family Participation in Patient Care: Yes (Comment) Care giver support system in place?: Yes (comment)   Criminal Activity/Legal Involvement Pertinent to Current Situation/Hospitalization: No - Comment as needed  Activities of Daily Living Home Assistive Devices/Equipment: None ADL Screening (condition at time of admission) Patient's cognitive ability adequate to safely complete daily activities?: Yes Is the patient deaf or have difficulty hearing?: No Does the patient have difficulty seeing, even when wearing glasses/contacts?: Yes Does the patient have difficulty concentrating,  remembering, or making decisions?: No Patient able to express need for assistance with ADLs?: No Does the patient have difficulty dressing or bathing?: No Independently performs ADLs?: Yes (appropriate for developmental age) Does the patient have difficulty walking or climbing stairs?: No Weakness of Legs: None Weakness of Arms/Hands: None  Permission Sought/Granted                  Emotional Assessment Appearance:: Appears stated age   Affect (typically observed): Appropriate Orientation: : Oriented to Self, Oriented to Place, Oriented to  Time, Oriented to Situation   Psych Involvement: No (comment)  Admission diagnosis:  Chest pain, unspecified type [R07.9] Patient Active Problem List   Diagnosis Date Noted  . Chest pain on exertion 01/03/2019  . Noncompliance w/medication treatment due to intermit use of medication 01/03/2019  . Diabetic neuropathy ([place]) 06/23/2017  . Skin abnormality 05/27/2017  . Seborrheic keratoses 05/20/2017  . Nevus, non-neoplastic 05/20/2017  . Renal cyst, right 11/12/2015  . Diabetes mellitus type 2, controlled ([place]) 08/29/2015  . Chronic back pain 04/27/2015  . Chest pain with moderate risk of acute coronary syndrome   . New onset seizure ([place]) 09/29/2014  . Neck pain 09/25/2014  . Encounter for chronic pain management 08/09/2014  . CAD- 2 V PCI Aug 2015   . Hyperlipidemia   . Nephrolithiasis 10/12/2013  . Primary hyperaldosteronism ([place]) 09/13/2013  . Major depression ([place])  07/26/2013  . Restless legs syndrome 01/05/2013  . PTSD (post-traumatic stress disorder) 05/07/2012  . Back pain, thoracic 04/14/2012  . Gastroesophageal reflux disease with hiatal hernia 02/05/2012  . Hypercholesterolemia 02/05/2012  . Herniation of nucleus pulposus 02/05/2012  . Apnea, sleep 02/05/2012  . Headache(784.0) 11/14/2011  . Atypical chest pain 08/14/2011  . Enlarged prostate 03/26/2011  . Osteoarthrosis involving lower leg 01/08/2010  . Essential  hypertension 05/02/2008  . Barrett's esophagus 10/29/2006   PCP:  Marjie Skiff, MD Pharmacy:   [place], [place] - [address] [place] [address] Phone: [phone] Fax: [place] [address] [place] [address] Phone: [phone] Fax: [phone]     Social Determinants of Health (SDOH) Interventions    Readmission Risk Interventions No flowsheet data found.  Expected Discharge Plan: Acute to Acute Transfer Barriers to Discharge: No Barriers Identified   Patient Goals and CMS Choice Patient states their goals for this hospitalization and ongoing recovery are:: To return home and be well.       Expected Discharge Plan and Services Expected Discharge Plan: Acute to Acute Transfer In-house Referral: Clinical Social Work       Expected Discharge Date: 01/04/19                                    Prior Living Arrangements/Services     Patient language and need for interpreter reviewed:: No Do you feel safe going back to the place where you live?: Yes      Need for Family Participation in Patient Care: Yes (Comment) Care giver support system in place?: Yes (comment)   Criminal Activity/Legal Involvement Pertinent to Current Situation/Hospitalization: No - Comment as needed  Activities of Daily Living Home Assistive Devices/Equipment: None ADL Screening (condition at time of admission) Patient's cognitive ability adequate to safely complete daily activities?: Yes Is the patient deaf or have difficulty hearing?: No Does the patient have difficulty seeing, even when wearing glasses/contacts?: Yes Does the patient have difficulty concentrating, remembering, or making decisions?: No Patient able to express need for assistance with ADLs?: No Does the patient have difficulty dressing or bathing?:  No Independently performs ADLs?: Yes (appropriate for developmental age) Does the patient have difficulty walking or climbing stairs?: No Weakness of Legs: None Weakness of Arms/Hands: None  Permission Sought/Granted                  Emotional Assessment Appearance:: Appears stated age   Affect (typically observed): Appropriate Orientation: : Oriented to Self, Oriented to Place, Oriented to  Time, Oriented to Situation   Psych Involvement: No (comment)  Admission diagnosis:  Chest pain, unspecified type [R07.9] Patient Active Problem List   Diagnosis Date Noted  . Chest pain on exertion 01/03/2019  . Noncompliance w/medication treatment due to intermit use of medication 01/03/2019  . Diabetic neuropathy ([place]) 06/23/2017  . Skin abnormality 05/27/2017  . Seborrheic keratoses 05/20/2017  . Nevus, non-neoplastic 05/20/2017  . Renal cyst, right 11/12/2015  . Diabetes mellitus type 2, controlled ([place]) 08/29/2015  . Chronic back pain 04/27/2015  . Chest pain with moderate risk of acute coronary syndrome   . New onset seizure ([place]) 09/29/2014  . Neck pain 09/25/2014  . Encounter for chronic pain management 08/09/2014  . CAD- 2 V PCI Aug 2015   .  Hyperlipidemia   . Nephrolithiasis 10/12/2013  . Primary hyperaldosteronism ([place]) 09/13/2013  . Major depression ([place]) 07/26/2013  . Restless legs syndrome 01/05/2013  . PTSD (post-traumatic stress disorder) 05/07/2012  . Back pain, thoracic 04/14/2012  . Gastroesophageal reflux disease with hiatal hernia 02/05/2012  . Hypercholesterolemia 02/05/2012  . Herniation of nucleus pulposus 02/05/2012  . Apnea, sleep 02/05/2012  . Headache(784.0) 11/14/2011  . Atypical chest pain 08/14/2011  . Enlarged prostate 03/26/2011  . Osteoarthrosis involving lower leg 01/08/2010  . Essential hypertension 05/02/2008  . Barrett's esophagus 10/29/2006   PCP:  Marjie Skiff, MD Pharmacy:   [place],  [place] - [address] [place] [address] Phone: [phone] Fax: [place] [address] [place] [address] Phone: [phone] Fax: [phone]     Social Determinants of Health (SDOH) Interventions    Readmission Risk Interventions No flowsheet data found.

## 2019-01-04 NOTE — H&P (View-Only) (Signed)
Echo shows EF 35-40% with new inferior wall motion abnormality This correlates with ECG changes  Although troponin is negative he has had a subacute MI Transfer to [facility] for cath today   [employer]

## 2019-01-04 NOTE — Interval H&P Note (Signed)
Cath Lab Visit (complete for each Cath Lab visit)  Clinical Evaluation Leading to the Procedure:   ACS: No.  Non-ACS:    Anginal Classification: CCS III  Anti-ischemic medical therapy: Minimal Therapy (1 class of medications)  Non-Invasive Test Results: No non-invasive testing performed  Prior CABG: No previous CABG      History and Physical Interval Note:  01/04/2019 5:00 PM  Andrew Huerta  has presented today for surgery, with the diagnosis of cardiomyopathy.  The various methods of treatment have been discussed with the patient and family. After consideration of risks, benefits and other options for treatment, the patient has consented to  Procedure(s): LEFT HEART CATH AND CORONARY ANGIOGRAPHY (N/A) as a surgical intervention.  The patient's history has been reviewed, patient examined, no change in status, stable for surgery.  I have reviewed the patient's chart and labs.  Questions were answered to the patient's satisfaction.     Shelva Majestic

## 2019-01-04 NOTE — Progress Notes (Signed)
01/04/2019 11:39 AM   Updated by Antonieta Pert team regarding abnormal stress testing and he will transfer to [facility] for cath under the cardiology service.  TRH will sign off for now but would be available at any time if needed.   Murvin Natal MD  How to contact the [facility] Attending or Consulting provider [place] or covering provider during after hours [place], for this patient?  1. Check the care team in [facility] and look for a) attending/consulting TRH provider listed and b) the [facility] team listed 2. Log into [email or web address] and use [place]'s universal password to access. If you do not have the password, please contact the hospital operator. 3. Locate the [facility] provider you are looking for under [facility] and page to a number that you can be directly reached. 4. If you still have difficulty reaching the provider, please page the [facility] (Director on Call) for the Hospitalists listed on amion for assistance.

## 2019-01-04 NOTE — Progress Notes (Signed)
Patient received from transport from [facility] and report was given to this RN. Patient is alert and oriented, attached to the monitor with IV fluids going at 100 mL/hr. Patient has 1 patient belonging bag.

## 2019-01-04 NOTE — Evaluation (Signed)
Physical Therapy Evaluation Patient Details Name: Andrew Huerta MRN: [id number] DOB: May 11, 1966 Today's Date: 01/04/2019   History of Present Illness  Andrew Huerta is a 53 y.o. male non-smoker with known coronary artery disease who presented today with symptoms of chest pressure and pain in discomfort associated with mowing the lawn.  He said he had been cutting grass for approximately 30 minutes when he started to experience chest pain and pressure symptoms that radiated to the left arm.  The symptoms felt similar to how he felt with his prior MI.  The patient reports that he has been out of his cardiac medications due to lacking medical insurance.  He also reports that shortly after he had the symptoms he had a brief episode of syncope.  He says that he has no history of epilepsy.  He reports that he is unsure of how long he was down.  The patient says that he had no symptoms of palpitations.  He denies headaches.  He denies having cough and cold symptoms and shortness of breath.  He was tested for COVID-19 as negative.    Clinical Impression  Patient functioning at baseline for functional mobility and gait.  Patient's vitals WNLs with SpO2 at 98% throughout treatment.  Patient tolerated sitting up in chair after therapy.  Plan:  Patient discharged from physical therapy to care of nursing for ambulation daily as tolerated for length of stay.    Follow Up Recommendations No PT follow up    Equipment Recommendations  None recommended by PT    Recommendations for Other Services       Precautions / Restrictions Precautions Precautions: None Restrictions Weight Bearing Restrictions: No      Mobility  Bed Mobility Overal bed mobility: Independent                Transfers Overall transfer level: Independent                  Ambulation/Gait Ambulation/Gait assistance: Independent Gait Distance (Feet): 200 Feet Assistive device: None Gait Pattern/deviations:  WFL(Within Functional Limits) Gait velocity: normal   General Gait Details: demonstrates good return for ambulation without AD on level, inclined, and declined surfaces with loss of balance, and able to safely ambulate pushing IV pole  Stairs            Wheelchair Mobility    Modified Rankin (Stroke Patients Only)       Balance Overall balance assessment: Independent                                           Pertinent Vitals/Pain Pain Assessment: 0-10 Pain Score: 5  Pain Location: LUE (family member held him by his left arm when he passed out), mild chest pain Pain Descriptors / Indicators: Sore;Discomfort Pain Intervention(s): Limited activity within patient's tolerance;Monitored during session    Home Living Family/patient expects to be discharged to:: Private residence Living Arrangements: Children Available Help at Discharge: Family Type of Home: Mobile home Home Access: Stairs to enter Entrance Stairs-Rails: None Entrance Stairs-Number of Steps: 1 Home Layout: One level Home Equipment: Crutches;Cane - single point      Prior Function Level of Independence: Independent         Comments: Hydrographic surveyor, drives     Hand Dominance        Extremity/Trunk Assessment   Upper Extremity Assessment Upper Extremity  Assessment: Overall WFL for tasks assessed    Lower Extremity Assessment Lower Extremity Assessment: Overall WFL for tasks assessed    Cervical / Trunk Assessment Cervical / Trunk Assessment: Normal  Communication   Communication: No difficulties  Cognition Arousal/Alertness: Awake/alert Behavior During Therapy: WFL for tasks assessed/performed Overall Cognitive Status: Within Functional Limits for tasks assessed                                        General Comments      Exercises     Assessment/Plan    PT Assessment Patent does not need any further PT services  PT Problem List          PT Treatment Interventions      PT Goals (Current goals can be found in the Care Plan section)  Acute Rehab PT Goals Patient Stated Goal: return home PT Goal Formulation: With patient Time For Goal Achievement: 01/04/19 Potential to Achieve Goals: Good    Frequency     Barriers to discharge        Co-evaluation               AM-PAC PT "6 Clicks" Mobility  Outcome Measure Help needed turning from your back to your side while in a flat bed without using bedrails?: None Help needed moving from lying on your back to sitting on the side of a flat bed without using bedrails?: None Help needed moving to and from a bed to a chair (including a wheelchair)?: None Help needed standing up from a chair using your arms (e.g., wheelchair or bedside chair)?: None Help needed to walk in hospital room?: None Help needed climbing 3-5 steps with a railing? : None 6 Click Score: 24    End of Session   Activity Tolerance: Patient tolerated treatment well Patient left: in chair;with call bell/phone within reach Nurse Communication: Mobility status PT Visit Diagnosis: Unsteadiness on feet (R26.81);Other abnormalities of gait and mobility (R26.89);Muscle weakness (generalized) (M62.81)    Time: 3474-2595 PT Time Calculation (min) (ACUTE ONLY): 23 min   Charges:   PT Evaluation $PT Eval Moderate Complexity: 1 Mod PT Treatments $Therapeutic Activity: 23-37 mins        9:25 AM, 01/04/19 Andrew Huerta, MPT Physical Therapist with [facility] [phone] office [phone] mobile phone

## 2019-01-05 ENCOUNTER — Encounter (HOSPITAL_COMMUNITY): Payer: Self-pay | Admitting: Cardiovascular Disease

## 2019-01-05 ENCOUNTER — Telehealth: Payer: Self-pay | Admitting: Student

## 2019-01-05 DIAGNOSIS — I25118 Atherosclerotic heart disease of native coronary artery with other forms of angina pectoris: Secondary | ICD-10-CM

## 2019-01-05 LAB — BASIC METABOLIC PANEL
Anion gap: 12 (ref 5–15)
BUN: 12 mg/dL (ref 6–20)
CO2: 23 mmol/L (ref 22–32)
Calcium: 8.9 mg/dL (ref 8.9–10.3)
Chloride: 104 mmol/L (ref 98–111)
Creatinine, Ser: 0.83 mg/dL (ref 0.61–1.24)
GFR calc Af Amer: >60 mL/min (ref >60)
GFR calc non Af Amer: >60 mL/min (ref >60)
Glucose, Bld: 95 mg/dL (ref 70–99)
Potassium: 3.6 mmol/L (ref 3.5–5.1)
Sodium: 139 mmol/L (ref 135–145)

## 2019-01-05 LAB — CBC
HCT: 38.5 % — ABNORMAL LOW (ref 39.0–52.0)
Hemoglobin: 13.1 g/dL (ref 13.0–17.0)
MCH: 30.3 pg (ref 26.0–34.0)
MCHC: 34 g/dL (ref 30.0–36.0)
MCV: 88.9 fL (ref 80.0–100.0)
Platelets: 205 10*3/uL (ref 150–400)
RBC: 4.33 MIL/uL (ref 4.22–5.81)
RDW: 12.6 % (ref 11.5–15.5)
WBC: 10.3 10*3/uL (ref 4.0–10.5)
nRBC: 0 % (ref 0.0–0.2)

## 2019-01-05 LAB — GLUCOSE, CAPILLARY
Glucose-Capillary: 142 mg/dL — ABNORMAL HIGH (ref 70–99)
Glucose-Capillary: 183 mg/dL — ABNORMAL HIGH (ref 70–99)
Glucose-Capillary: 103 mg/dL — ABNORMAL HIGH (ref 70–99)

## 2019-01-05 MED ORDER — LOSARTAN POTASSIUM 25 MG PO TABS: 25.0000 mg | ORAL_TABLET | Freq: Every day | ORAL | 2 refills | Status: DC

## 2019-01-05 MED ORDER — CLOPIDOGREL BISULFATE 75 MG PO TABS: 75.0000 mg | ORAL_TABLET | Freq: Every day | ORAL | 11 refills | Status: DC

## 2019-01-05 MED ORDER — CLOPIDOGREL BISULFATE 75 MG PO TABS
300.0000 mg | ORAL_TABLET | Freq: Once | ORAL | Status: AC
  Administered 2019-01-05: 300 mg via ORAL
  Filled 2019-01-05: qty 4

## 2019-01-05 MED ORDER — CLOPIDOGREL BISULFATE 75 MG PO TABS: 75.0000 mg | ORAL_TABLET | Freq: Every day | ORAL | Status: DC

## 2019-01-05 MED ORDER — NITROGLYCERIN 0.4 MG SL SUBL: 0.4000 mg | SUBLINGUAL_TABLET | SUBLINGUAL | 3 refills | Status: DC | PRN

## 2019-01-05 MED ORDER — LOSARTAN POTASSIUM 25 MG PO TABS
25.0000 mg | ORAL_TABLET | Freq: Every day | ORAL | Status: DC
  Administered 2019-01-05: 25 mg via ORAL
  Filled 2019-01-05: qty 1

## 2019-01-05 MED ORDER — PANTOPRAZOLE SODIUM 40 MG PO TBEC: 40.0000 mg | DELAYED_RELEASE_TABLET | Freq: Every day | ORAL | Status: DC

## 2019-01-05 MED ORDER — METOPROLOL TARTRATE 25 MG PO TABS: 25.0000 mg | ORAL_TABLET | Freq: Two times a day (BID) | ORAL | 2 refills | Status: DC

## 2019-01-05 MED ORDER — ATORVASTATIN CALCIUM 80 MG PO TABS: 80.0000 mg | ORAL_TABLET | Freq: Every day | ORAL | 2 refills | Status: DC

## 2019-01-05 MED FILL — METOPROLOL TARTRATE 25 MG T: 25 | 30 days supply | Qty: 60 | Fill #0 | Status: TO

## 2019-01-05 MED FILL — ATORVASTATIN CALCIUM 80 MG: 80 | 30 days supply | Qty: 30 | Fill #0 | Status: TO

## 2019-01-05 MED FILL — LOSARTAN POTASSIUM 25 MG TA: 25 | 30 days supply | Qty: 30 | Fill #0 | Status: TO

## 2019-01-05 MED FILL — CLOPIDOGREL 75 MG TABLET: 75 | 30 days supply | Qty: 30 | Fill #0 | Status: TO

## 2019-01-05 MED FILL — NITROGLYCERIN 0.4 MG TAB SL: 0.4 | 7 days supply | Qty: 25 | Fill #0 | Status: TO

## 2019-01-05 NOTE — Telephone Encounter (Signed)
   TELEPHONE CALL NOTE  This patient has been deemed a candidate for follow-up tele-health visit to limit community exposure during the Covid-19 pandemic. I spoke with the patient via phone to discuss instructions. This has been outlined on the patient's AVS (dotphrase: [email or web address]). The patient was advised to review the section on consent for treatment as well. The patient will receive a phone call 2-3 days prior to their E-Visit at which time consent will be verbally confirmed. A Virtual Office Visit appointment type has been scheduled for 01/19/19 with Almyra Deforest, with "VIDEO" or "TELEPHONE" in the appointment notes - patient prefers VIDEO type.  Darreld Mclean, PA-C 01/05/2019 9:40 AM

## 2019-01-05 NOTE — TOC Initial Note (Addendum)
Transition of Care [facility]) - Initial/Assessment Note    Patient Details  Name: Andrew Huerta MRN: [id number] Date of Birth: 07/30/1966  Transition of Care [facility] CM/SW Contact:    Bethena Roys, RN Phone Number: 01/05/2019, 11:00 AM  Clinical Narrative: Pt presented as a transfer from [facility]. Patient lives in [place] in a single family home. Patient works part-time and he uses Public house manager on [employer]. Patient on [place]- has been on in the past and he utilized the free [employer]. Patient will not be able to utilize again. CM did reach out to PA- plan will be to transition to Plavix. CM will reach out to PCP to schedule hospital follow up appointment. [facility] will be able to speak with Social Worker at the [facility] to assist with navigation of community assistance and to navigate via DSS. No further needs from CM at this time.              Expected Discharge Plan: Home/Self Care Barriers to Discharge: No Barriers Identified   Patient Goals and CMS Choice Patient states their goals for this hospitalization and ongoing recovery are:: To return home and be well.  CMS Medicare.gov Compare Post Acute Care list provided to:: (N/A) Choice offered to / list presented to : NA  Expected Discharge Plan and Services Expected Discharge Plan: Home/Self Care In-house Referral: Clinical Social Work Discharge Planning Services: CM Consult Post Acute Care Choice: NA Living arrangements for the past 2 months: Single Family Home Expected Discharge Date: 01/04/19               DME Arranged: N/A DME Agency: NA       HH Arranged: NA [place] Agency: NA        Prior Living Arrangements/Services Living arrangements for the past 2 months: Single Family Home Lives with:: Adult Children Patient language and need for interpreter reviewed:: Yes Do you feel safe going back to the place where you live?: Yes      Need for Family  Participation in Patient Care: Yes (Comment) Care giver support system in place?: Yes (comment) Current home services: (none) Criminal Activity/Legal Involvement Pertinent to Current Situation/Hospitalization: No - Comment as needed  Activities of Daily Living Home Assistive Devices/Equipment: None ADL Screening (condition at time of admission) Patient's cognitive ability adequate to safely complete daily activities?: Yes Is the patient deaf or have difficulty hearing?: No Does the patient have difficulty seeing, even when wearing glasses/contacts?: Yes Does the patient have difficulty concentrating, remembering, or making decisions?: No Patient able to express need for assistance with ADLs?: No Does the patient have difficulty dressing or bathing?: No Independently performs ADLs?: Yes (appropriate for developmental age) Does the patient have difficulty walking or climbing stairs?: No Weakness of Legs: None Weakness of Arms/Hands: None  Permission Sought/Granted Permission sought to share information with : Family Supports                Emotional Assessment Appearance:: Appears stated age Attitude/Demeanor/Rapport: Engaged Affect (typically observed): Accepting Orientation: : Oriented to Self, Oriented to Situation, Oriented to Place, Oriented to  Time Alcohol / Substance Use: Not Applicable Psych Involvement: No (comment)  Admission diagnosis:  Chest pain, unspecified type [R07.9] Chest pain on exertion [R07.9] Patient Active Problem List   Diagnosis Date Noted  . Chest pain on exertion 01/03/2019  . Noncompliance w/medication treatment due to intermit use of medication 01/03/2019  . Diabetic neuropathy ([place]) 06/23/2017  .  Skin abnormality 05/27/2017  . Seborrheic keratoses 05/20/2017  . Nevus, non-neoplastic 05/20/2017  . Renal cyst, right 11/12/2015  . Diabetes mellitus type 2, controlled ([place]) 08/29/2015  . Chronic back pain 04/27/2015  . Chest pain with moderate  risk of acute coronary syndrome   . New onset seizure ([place]) 09/29/2014  . Neck pain 09/25/2014  . Encounter for chronic pain management 08/09/2014  . CAD- 2 V PCI Aug 2015   . Hyperlipidemia   . Nephrolithiasis 10/12/2013  . Primary hyperaldosteronism ([place]) 09/13/2013  . Major depression ([place]) 07/26/2013  . Restless legs syndrome 01/05/2013  . PTSD (post-traumatic stress disorder) 05/07/2012  . Back pain, thoracic 04/14/2012  . Gastroesophageal reflux disease with hiatal hernia 02/05/2012  . Hypercholesterolemia 02/05/2012  . Herniation of nucleus pulposus 02/05/2012  . Apnea, sleep 02/05/2012  . Headache(784.0) 11/14/2011  . Atypical chest pain 08/14/2011  . Enlarged prostate 03/26/2011  . Osteoarthrosis involving lower leg 01/08/2010  . Essential hypertension 05/02/2008  . Barrett's esophagus 10/29/2006   PCP:  Marjie Skiff, MD Pharmacy:   [place], [place] - [address] [place]  [address] Phone: [phone] Fax: [place] [address] [place] [address] Phone: [phone] Fax: [phone]     Social Determinants of Health (SDOH) Interventions    Readmission Risk Interventions No flowsheet data found.

## 2019-01-05 NOTE — Progress Notes (Signed)
Removed TR band from right radial. Site is a level 0. Applied gauze and Tegaderm to site. Educated pt to leave in place 24 hrs. Pt verbalizes understanding.

## 2019-01-05 NOTE — Discharge Instructions (Signed)
Recommendations: - It is VERY important that you take the Aspirin and Plavix every day in order to prevent your stent from closing back up. - You can restart taking the Metformin 48 hours after cardiac catheterization (evening of 01/06/2019). - Please take all other medications as described elsewhere on the discharge summary. If you cannot afford fill your medications, please call our office or your primary care physician's office.  - Please follow up with your primary care physician and your Cardiology team as directed.   Cardiac Catheterization NO HEAVY LIFTING OR SEXUAL ACTIVITY X 7 DAYS. NO DRIVING X 3-5 DAYS. NO SOAKING BATHS, HOT TUBS, POOLS, ETC., X 7 DAYS.  Radial Site Care: Refer to this sheet in the next few weeks. These instructions provide you with information on caring for yourself after your procedure. Your caregiver may also give you more specific instructions. Your treatment has been planned according to current medical practices, but problems sometimes occur. Call your caregiver if you have any problems or questions after your procedure. HOME CARE INSTRUCTIONS  You may shower the day after the procedure.Remove the bandage (dressing) and gently wash the site with plain soap and water.Gently pat the site dry.   Do not apply powder or lotion to the site.   Do not submerge the affected site in water for 3 to 5 days.   Inspect the site at least twice daily.   Do not flex or bend the affected arm for 24 hours.   No lifting over 5 pounds (2.3 kg) for 5 days after your procedure.   Do not drive home if you are discharged the same day of the procedure. Have someone else drive you.  What to expect:  Any bruising will usually fade within 1 to 2 weeks.   Blood that collects in the tissue (hematoma) may be painful to the touch. It should usually decrease in size and tenderness within 1 to 2 weeks.  SEEK IMMEDIATE MEDICAL CARE IF:  You have unusual pain at the radial site.    You have redness, warmth, swelling, or pain at the radial site.   You have drainage (other than a small amount of blood on the dressing).   You have chills.   You have a fever or persistent symptoms for more than 72 hours.   You have a fever and your symptoms suddenly get worse.   Your arm becomes pale, cool, tingly, or numb.   You have heavy bleeding from the site. Hold pressure on the site.     YOUR CARDIOLOGY TEAM HAS ARRANGED FOR AN E-VISIT FOR YOUR APPOINTMENT - PLEASE REVIEW IMPORTANT INFORMATION BELOW SEVERAL DAYS PRIOR TO YOUR APPOINTMENT  Due to the recent COVID-19 pandemic, we are transitioning in-person office visits to tele-medicine visits in an effort to decrease unnecessary exposure to our patients, their families, and staff. These visits are billed to your insurance just like a normal visit is. We also encourage you to sign up for MyChart if you have not already done so. You will need a smartphone if possible. For patients that do not have this, we can still complete the visit using a regular telephone but do prefer a smartphone to enable video when possible. You may have a family member that lives with you that can help. If possible, we also ask that you have a blood pressure cuff and scale at home to measure your blood pressure, heart rate and weight prior to your scheduled appointment. Patients with clinical needs that need  an in-person evaluation and testing will still be able to come to the office if absolutely necessary. If you have any questions, feel free to call our office.   YOUR PROVIDER WILL BE USING ONE OF THE FOLLOWING PLATFORM TO COMPLETE YOUR VISIT:  MyChart/Doximity/Doxy.Me (our office will call you a couple of day before your visit with more information)   IF USING MYCHART - How to Download the MyChart App to Your SmartPhone   - If Apple, go to [employer] and type in MyChart in the search bar and download the app. If Android, ask patient to go to [employer] and type in [place] in the search bar and download the app. The app is free but as with any other app downloads, your phone may require you to verify saved payment information or Apple/Android password.  - You will need to then log into the app with your MyChart username and password, and select [facility] as your healthcare provider to link the account.  - When it is time for your visit, go to the MyChart app, find appointments, and click Begin Video Visit. Be sure to Select Allow for your device to access the Microphone and Camera for your visit. You will then be connected, and your provider will be with you shortly.  **If you have any issues connecting or need assistance, please contact MyChart service desk [phone])**  **If using a computer, in order to ensure the best quality for your visit, you will need to use either of the following Internet Browsers: Insurance underwriter or Microsoft Edge**   IF USING DOXIMITY or [email or web address].ME - The staff will give you instructions on receiving your link to join the meeting the day of your visit.   2-3 DAYS BEFORE YOUR APPOINTMENT  You will receive a telephone call from one of our [place] team members - your caller ID may say "Unknown caller." If this is a video visit, we will walk you through how to get the video launched on your phone. We will remind you check your blood pressure, heart rate and weight prior to your scheduled appointment. If you have an Apple Watch or Kardia, please upload any pertinent ECG strips the day before or morning of your appointment to [facility]. Our staff will also make sure you have reviewed the consent and agree to move forward with your scheduled tele-health visit.   THE DAY OF YOUR APPOINTMENT  Approximately 15 minutes prior to your scheduled appointment, you will receive a telephone call from one of [facility] team - your caller ID may say "Unknown caller."  Our staff will confirm medications, vital signs  for the day and any symptoms you may be experiencing. Please have this information available prior to the time of visit start. It may also be helpful for you to have a pad of paper and pen handy for any instructions given during your visit. They will also walk you through joining the smartphone meeting if this is a video visit.  CONSENT FOR TELE-HEALTH VISIT - PLEASE REVIEW  I hereby voluntarily request, consent and authorize [facility] and its employed or contracted physicians, physician assistants, nurse practitioners or other licensed health care professionals (the Practitioner), to provide me with telemedicine health care services (the Services") as deemed necessary by the treating Practitioner. I acknowledge and consent to receive the Services by the Practitioner via telemedicine. I understand that the telemedicine visit will involve communicating with the Practitioner through live audiovisual communication technology and the  disclosure of certain medical information by electronic transmission. I acknowledge that I have been given the opportunity to request an in-person assessment or other available alternative prior to the telemedicine visit and am voluntarily participating in the telemedicine visit.  I understand that I have the right to withhold or withdraw my consent to the use of telemedicine in the course of my care at any time, without affecting my right to future care or treatment, and that the Practitioner or I may terminate the telemedicine visit at any time. I understand that I have the right to inspect all information obtained and/or recorded in the course of the telemedicine visit and may receive copies of available information for a reasonable fee.  I understand that some of the potential risks of receiving the Services via telemedicine include:   Delay or interruption in medical evaluation due to technological equipment failure or disruption;  Information transmitted may not be  sufficient (e.g. poor resolution of images) to allow for appropriate medical decision making by the Practitioner; and/or   In rare instances, security protocols could fail, causing a breach of personal health information.  Furthermore, I acknowledge that it is my responsibility to provide information about my medical history, conditions and care that is complete and accurate to the best of my ability. I acknowledge that Practitioner's advice, recommendations, and/or decision may be based on factors not within their control, such as incomplete or inaccurate data provided by me or distortions of diagnostic images or specimens that may result from electronic transmissions. I understand that the practice of medicine is not an exact science and that Practitioner makes no warranties or guarantees regarding treatment outcomes. I acknowledge that I will receive a copy of this consent concurrently upon execution via email to the email address I last provided but may also request a printed copy by calling the office of [facility].    I understand that my insurance will be billed for this visit.   I have read or had this consent read to me.  I understand the contents of this consent, which adequately explains the benefits and risks of the Services being provided via telemedicine.   I have been provided ample opportunity to ask questions regarding this consent and the Services and have had my questions answered to my satisfaction.  I give my informed consent for the services to be provided through the use of telemedicine in my medical care  By participating in this telemedicine visit I agree to the above.

## 2019-01-05 NOTE — Progress Notes (Signed)
Cardiac Rehab Advisory Cardiac Rehab Phase I is not seeing pts face to face at this time due to Covid 19 restrictions. Ambulation is occurring through nursing, PT, and mobility teams. We will help facilitate that process as needed. We are calling pts in their rooms and discussing education. We will then deliver education materials to pts RN for delivery to pt.   [id number] PCI/stent education reviewed with patient over the phone including restrictions, risk factor modification, CP, and calling 911. Handout given on heart healthy/ diabetic diet and exercise guidelines. Pt does not have a meter to check blood sugar because of cost. Pt states that he doesn't have insurance and can't afford to make heart healthy food choices. Pt has many barriers. Pt given resources for food pantries and other resources for meals in the community.   Andrew Passer, MS, ACSM CEP

## 2019-01-05 NOTE — TOC Transition Note (Signed)
Transition of Care [facility]) - CM/SW Discharge Note   Patient Details  Name: Andrew Huerta MRN: [id number] Date of Birth: 06-22-1966  [facility]) CM/SW Contact:  Bethena Roys, RN Phone Number: 01/05/2019, 4:52 PM   Clinical Narrative:  MATCH completed for medication assistance. Medications will be delivered to patients bedside before he leaves. No further needs from CM at this time.      Final next level of care: Home/Self Care Barriers to Discharge: No Barriers Identified   Patient Goals and CMS Choice Patient states their goals for this hospitalization and ongoing recovery are:: To return home and be well.  CMS Medicare.gov Compare Post Acute Care list provided to:: (N/A) Choice offered to / list presented to : NA   Discharge Plan and Services In-house Referral: Clinical Social Work Discharge Planning Services: CM Consult Post Acute Care Choice: NA          DME Arranged: N/A DME Agency: NA       HH Arranged: NA HH Agency: NA        Social Determinants of Health (SDOH) Interventions     Readmission Risk Interventions No flowsheet data found.
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
## 2019-01-05 NOTE — Progress Notes (Signed)
Progress Note  Patient Name: Andrew Huerta Date of Encounter: 01/05/2019  Primary Cardiologist: Peter Martinique, MD   Subjective   No significant overnight events.  He has chronic pain but no worsening of his pain.  Troponin levels are negative.  Feels back to baseline.   Inpatient Medications    Scheduled Meds: . aspirin  81 mg Oral Daily  . atorvastatin  80 mg Oral q1800  . atorvastatin  80 mg Oral q1800  . enoxaparin (LOVENOX) injection  40 mg Subcutaneous Q24H  . insulin aspart  0-5 Units Subcutaneous QHS  . insulin aspart  0-9 Units Subcutaneous TID WC  . metoprolol tartrate  25 mg Oral BID  . pantoprazole  40 mg Oral Daily  . sodium chloride flush  3 mL Intravenous Q12H  . ticagrelor  90 mg Oral BID   Continuous Infusions: . sodium chloride    . sodium chloride 70 mL/hr at 01/05/19 0605   PRN Meds: sodium chloride, acetaminophen **OR** acetaminophen, acetaminophen, morphine injection, ondansetron (ZOFRAN) IV, ondansetron **OR** [DISCONTINUED] ondansetron (ZOFRAN) IV, sodium chloride flush, traZODone   Vital Signs    Vitals:   01/04/19 1934 01/05/19 0610 01/05/19 0630 01/05/19 0755  BP: 130/77 124/73  122/80  Pulse: 64 65  75  Resp: 11 10  11   Temp:   98.4 F (36.9 C) 97.6 F (36.4 C)  TempSrc:    Oral  SpO2: 99% 99%  99%  Weight:      Height:        Intake/Output Summary (Last 24 hours) at 01/05/2019 0807 Last data filed at 01/05/2019 4680 Gross per 24 hour  Intake 15171 ml  Output 1850 ml  Net 13321 ml   Last 3 Weights 01/03/2019 10/01/2018 03/20/2018  Weight (lbs) 157 lb 164 lb 6 oz 155 lb  Weight (kg) 71.215 kg 74.56 kg 70.308 kg      Telemetry     NSR  - Personally Reviewed  ECG    No new ECG tracings today. - Personally Reviewed  Physical Exam   Physical Exam: Blood pressure 113/79, pulse 66, temperature 97.6 F (36.4 C), temperature source Oral, resp. rate 14, height 5\' 7"  (1.702 m), weight 71.2 kg, SpO2 99 %.  GEN:  Well nourished,  well developed in no acute distress HEENT: Normal NECK: No JVD; No carotid bruits LYMPHATICS: No lymphadenopathy CARDIAC: RRR   RESPIRATORY:  Clear to auscultation without rales, wheezing or rhonchi  ABDOMEN: Soft, non-tender, non-distended MUSCULOSKELETAL:  No edema; No deformity , right radial cath site looks good  SKIN: Warm and dry NEUROLOGIC:  Alert and oriented x 3   Labs    Chemistry Recent Labs  Lab 01/03/19 1114 01/04/19 0238 01/05/19 0334  NA 137 137 139  K 4.2 4.3 3.6  CL 103 106 104  CO2 24 24 23   GLUCOSE 164* 127* 95  BUN 20 16 12   CREATININE 0.90 0.85 0.83  CALCIUM 9.3 8.7* 8.9  GFRNONAA >60 >60 >60  GFRAA >60 >60 >60  ANIONGAP 10 7 12      Hematology Recent Labs  Lab 01/03/19 1114 01/04/19 0238 01/05/19 0334  WBC 12.4* 12.4* 10.3  RBC 4.65 4.44 4.33  HGB 14.3 13.5 13.1  HCT 41.9 40.5 38.5*  MCV 90.1 91.2 88.9  MCH 30.8 30.4 30.3  MCHC 34.1 33.3 34.0  RDW 12.9 13.0 12.6  PLT 223 203 205    Cardiac Enzymes Recent Labs  Lab 01/03/19 1114 01/03/19 1456 01/03/19 2036 01/04/19 0238  TROPONINI <0.03 <0.03 <0.03 <0.03   No results for input(s): TROPIPOC in the last 168 hours.   BNPNo results for input(s): BNP, PROBNP in the last 168 hours.   DDimer No results for input(s): DDIMER in the last 168 hours.   Radiology    Dg Chest 2 View  Result Date: 01/03/2019 CLINICAL DATA:  Chest pain. EXAM: CHEST - 2 VIEW COMPARISON:  Radiographs of Jan 03, 2017. FINDINGS: The heart size and mediastinal contours are within normal limits. Both lungs are clear. No pneumothorax or pleural effusion is noted. The visualized skeletal structures are unremarkable. IMPRESSION: No active cardiopulmonary disease. Electronically Signed   By: Marijo Conception M.D.   On: 01/03/2019 11:50    Cardiac Studies   Left Heart Catheterization 01/04/2019:  Mid RCA lesion is 99% stenosed.  Mid RCA to Dist RCA lesion is 100% stenosed.  Ost LAD to Prox LAD lesion is 40% stenosed.   Previously placed Prox LAD stent (unknown type) is widely patent.  Prox LAD to Mid LAD lesion is 80% stenosed.  A stent was successfully placed.  Post intervention, there is a 0% residual stenosis.  Post intervention, there is a 0% residual stenosis.  Mid LAD lesion is 30% stenosed.  Previously placed Ost Cx to Prox Cx stent (unknown type) is widely patent.   Multivessel CAD with 40% smooth ostial proximal stenosis of the LAD with a septal perforating artery that arises from the ostium and has a 90% stenosis (not able to be depicted in the diagram), widely patent stent proximally followed by new 80% stenosis the on the stented segment and proximal to the mid diagonal vessels.  There is 30% LAD stenosis beyond the mid diagonal vessels; patent moderate-sized ramus intermediate vessel; patent proximal left circumflex stent; and diffuse 99% to 100% occlusion of the mid distal RCA with evidence for moderate left to right collateralization from the LAD, septal perforating arteries, and LCX.  LVEDP 17 mmHg.  Successful PCI to the 80% LAD stenosis with ultimate insertion of a 2.5 x 18 mm Resolute Onyx DES stent postdilated to 2.6 mm with the 80% stenosis being reduced to 0%.  Recommendation: DAPT for minimum of 1 year and possibly long-term due to the patient's significant CAD history.  Aggressive lipid-lowering therapy with target LDL less than 70.  Medical therapy for RCA occlusion with collateralization.  _______________  Echocardiogram 01/04/2019:  1. The left ventricle has moderately reduced systolic function, with an ejection fraction of 35-40%. The cavity size was moderately dilated. There is mildly increased left ventricular wall thickness. Left ventricular diastolic parameters were normal.  2. New inferior RWMA since previous study.  3. The right ventricle has normal systolic function. The cavity was normal. There is no increase in right ventricular wall thickness.  4. Left atrial  size was mildly dilated.  5. Mild thickening of the mitral valve leaflet.  6. The aortic valve is tricuspid. Mild thickening of the aortic valve. Mild calcification of the aortic valve. No stenosis of the aortic valve.  Patient Profile   Mr. Naill is a 53 y.o. male with a history of CAD s/p DES to the mid LAD and mid left circumflex, chronic chest pain, hypertension, hyperlipidemia, type 2 diabetes mellitus, sleep apnea, GERD with Barrett's esophagus, anxiety, depression, and PTSD who presented to the [facility] ED on 01/03/2019 for evaluation of chest pain. EKG showed T wave inversion in inferior leads leads. Echo showed LVEF of 35 to 40% with new inferior right wall  motion abnormality. Therefore, patient was transferred to [facility] for cardiac catheterization.  Assessment & Plan    Chest Pain with Known CAD - Troponin negative x3. - Echo showed LVEF of 35 to 40% with new inferior right wall motion abnormality.  - Cardiac catheterization showed multivessel CAD with 99-100% stenosis of the mid to distal RCA with left to right collaterals and 80% stenosis of the proximal to mid LAD. Patient underwent successful PCI with DES to the LAD lesion. - Continue dual antiplatelet therapy with Aspirin and Brilinta. - Continue aggressive secondary prevention with beta blocker (Lopressor 25mg  twice daily) and high-intensity statin (Lipitor 80mg  daily).  Ischemic Cardiomyopathy - LVEF of 35 to 40%. - Continue beta blocker as above.  - BP has been somewhat labile here and has been soft at times with systolic BP in the 84'Y to low 100's. Could consider starting low dose ACEi/ARB if BP can tolerate it. May need to do this as outpatient. - Consider repeat Echo in a couple of months to re-evaluate EF.  Syncope - Patient had syncopal episodes on day of presentation. Per Dr. Alan Ripper note, "sound orthostatic in nature." - Will defer outpatient monitor to MD.  Hypertension - Most recent BP 122/80. - Continue  current medications.  - Continue Lopressor as above. - Patient not on any other medications at home due to cost.  Hyperlipidemia - Lipid panel this admission: Cholesterol 230, Triglycerides 230, HDL 44, LDL 156. Patient admitted to not always taking his medications at home, mostly due to financial issues. - LDL goal <70 given CAD. - Continue Lipitor 80mg  daily.  Type 2 Diabetes Mellitus - Hemoglobin A1c 7.3 this admission. - Management per primary team.  Pheochromocytoma  - Last CT scan in 2017 showed 1.7cm nodule in right adrenal that had increased in size from previous imaging. Does not appear that he has had any follow-up since. - Management per primary team.  For questions or updates, please contact [place] Please consult [email or web address] for contact info under        Signed, Darreld Mclean, PA-C  01/05/2019, 8:07 AM    Attending Note:   The patient was seen and examined.  Agree with assessment and plan as noted above.  Changes made to the above note as needed.  Patient seen and independently examined with Sande Rives, PA .   We discussed all aspects of the encounter. I agree with the assessment and plan as stated above.  1.   CAD :  S/p stenting of mid LAD .   Has CTO of RCA with good collateral filling .    Cont asa and brilinta No angina.  Troponin levels are negative.  He does have chronic chest pain and is having that same chronic chest pain today.  He has had that same chest pain for years.   2.  Acute on chronic systolic congestive heart failure.  His ejection fraction is around 35 to 40%.  He is on Toprol-XL.  We will start him on losartan 25 mg a day.  We will titrate up losartan as an outpatient as tolerated.  3.  Diabetes: Further management per internal medicine doctor.  4.  Work excuse: The patient does lots of lifting in the course of his job.  We will write him a work note to excuse him for 7 days since he should not lift anything heavier than 10  pounds with his right hand for the next week.  If he does well with ambulation,  he should be ok for DC today     I have spent a total of 40 minutes with patient reviewing hospital  notes , telemetry, EKGs, labs and examining patient as well as establishing an assessment and plan that was discussed with the patient. > 50% of time was spent in direct patient care.    Thayer Headings, Brooke Bonito., MD, [facility] 01/05/2019, 9:03 AM [address],  [place] Pager [phone]

## 2019-01-05 NOTE — Progress Notes (Signed)
   Patient does not have insurance. Therefore, we are going to switch his Brilinta to Plavix. Will give Plavix load of 300mg  prior to discharge and then start maintenance dose of 75mg  daily tomorrow. Spoke with Pharmacy who is going to look to see what time we can give Plavix load.  Darreld Mclean, PA-C 01/05/2019 11:13 AM

## 2019-01-07 ENCOUNTER — Ambulatory Visit (INDEPENDENT_AMBULATORY_CARE_PROVIDER_SITE_OTHER): Payer: Self-pay | Admitting: Family Medicine

## 2019-01-07 ENCOUNTER — Telehealth: Payer: Self-pay

## 2019-01-07 ENCOUNTER — Encounter: Payer: Self-pay | Admitting: Family Medicine

## 2019-01-07 ENCOUNTER — Other Ambulatory Visit: Payer: Self-pay

## 2019-01-07 VITALS — BP 105/75 | HR 76 | Wt 156.0 lb

## 2019-01-07 DIAGNOSIS — E119 Type 2 diabetes mellitus without complications: Secondary | ICD-10-CM

## 2019-01-07 DIAGNOSIS — R079 Chest pain, unspecified: Secondary | ICD-10-CM

## 2019-01-07 DIAGNOSIS — I5021 Acute systolic (congestive) heart failure: Secondary | ICD-10-CM | POA: Insufficient documentation

## 2019-01-07 MED ORDER — CARVEDILOL 3.125 MG PO TABS: 3.1250 mg | ORAL_TABLET | Freq: Two times a day (BID) | ORAL | 3 refills | Status: DC

## 2019-01-07 MED ORDER — METFORMIN HCL 1000 MG PO TABS: 1000.0000 mg | ORAL_TABLET | Freq: Two times a day (BID) | ORAL | 3 refills | Status: DC

## 2019-01-07 NOTE — Progress Notes (Signed)
Subjective:    Patient ID: Andrew Huerta, male    DOB: [birth year], 53 y.o.   MRN: [id number]   CC: Hospital follow up after recent stent placement  HPI: Patient is a 53 yo male who presents today to follow up on recent hospitalization for chest pain which subsequently revealed significant vessels disease requiring stenting. Patient reports since discharge he has been doing well he does have a bit of fatigue but denies chest pain. Patient reports mild lightheadedness but denies any fall. Patient was switched from Brilinta to Plavix due to cost and will continue on that with aspirin. He is otherwise taking all of his medication except metformin. He currently denies any shortness of breath, abdominal pain, headaches, dizziness, nausea or vomiting.  Smoking status reviewed   ROS: all other systems were reviewed and are negative other than in the HPI   Past Medical History:  Diagnosis Date  . Adrenal tumor    a. s/p adrenal gland resection at [facility]. left side removal per patient  . Anxiety   . Barrett's esophagus    EGD - 11/27/09 - short segment of Barrett's  . CAD (coronary artery disease)    a. 04/27/14 [place] s/p DES to mLAD and DES to mLCx  . Chronic back pain    upper and lower per patient  . Chronic chest pain   . Degenerative joint disease    Bilateral knees. Significant knee pain since playing football in high school. also in back per patient  . Depression   . Diabetes mellitus type 2, controlled ([place]) 08/29/2015   Diagnosed by A1c 7.6% during 08/26/15 hospital admission for chest pain  . Gastroesophageal reflux disease with hiatal hernia   . GERD (gastroesophageal reflux disease)   . Hiatal hernia    EGD - 11/27/2009  . Hyperlipidemia   . Hypertension   . Low back pain   . Primary hyperaldosteronism ([place]) 01/22/2012   S/P adrenalectomy     . PTSD (post-traumatic stress disorder)   . Sleep apnea   . Stone, kidney     Past Surgical History:  Procedure Laterality Date  .  ADRENALECTOMY  10/2013  . CARDIAC CATHETERIZATION  05/01/2014   Patent stents, other disease unchanged  . CARDIAC CATHETERIZATION  04/27/2014   Procedure: CORONARY STENT INTERVENTION;  Surgeon: Peter M Martinique, MD;  Location: [facility] CATH LAB;  Service: Cardiovascular;;  DES mid Cx  DES mid LAD  . CARDIAC CATHETERIZATION N/A 03/22/2015   Procedure: Left Heart Cath and Coronary Angiography;  Surgeon: Sherren Mocha, MD;  Location: [facility] CV LAB;  Service: Cardiovascular;  Laterality: N/A;  . CORONARY ANGIOPLASTY WITH STENT PLACEMENT  04/27/2014   3.0 x 16 mm Promus DES to the mid LAD and 3.5 x 28 mm Promus to the mid LCx, otherwise 20-30 percent lesions, EF 55%  . CORONARY STENT INTERVENTION N/A 01/04/2019   Procedure: CORONARY STENT INTERVENTION;  Surgeon: Troy Sine, MD;  Location: [place] CV LAB;  Service: Cardiovascular;  Laterality: N/A;  . KIDNEY STONE SURGERY  X 1  . KNEE ARTHROPLASTY Right 1984  . KNEE ARTHROSCOPY Right X 6  . LEFT HEART CATH AND CORONARY ANGIOGRAPHY N/A 01/04/2019   Procedure: LEFT HEART CATH AND CORONARY ANGIOGRAPHY;  Surgeon: Troy Sine, MD;  Location: [place] CV LAB;  Service: Cardiovascular;  Laterality: N/A;  . LEFT HEART CATHETERIZATION WITH CORONARY ANGIOGRAM N/A 04/27/2014   Procedure: LEFT HEART CATHETERIZATION WITH CORONARY ANGIOGRAM;  Surgeon: Peter M Martinique, MD;  Location: [place] CATH LAB;  Service: Cardiovascular;  Laterality: N/A;  . LEFT HEART CATHETERIZATION WITH CORONARY ANGIOGRAM N/A 05/01/2014   Procedure: LEFT HEART CATHETERIZATION WITH CORONARY ANGIOGRAM;  Surgeon: Burnell Blanks, MD;  Location: [facility] CATH LAB;  Service: Cardiovascular;  Laterality: N/A;  . LITHOTRIPSY  X 2    Past medical history, surgical, family, and social history reviewed and updated in the EMR as appropriate.  Objective:  BP 105/75   Pulse 76   Wt 156 lb (70.8 kg)   SpO2 99%   BMI 24.43 kg/m   Vitals and nursing note reviewed  General: NAD, pleasant, able  to participate in exam Cardiac: RRR, normal heart sounds, no murmurs. 2+ radial and PT pulses bilaterally Respiratory: CTAB, normal effort, No wheezes, rales or rhonchi Abdomen: soft, nontender, nondistended, no hepatic or splenomegaly, +BS Extremities: no edema or cyanosis. WWP. Skin: warm and dry, no rashes noted Neuro: alert and oriented x4, no focal deficits Psych: Normal affect and mood   Assessment & Plan:   Controlled type 2 diabetes mellitus without complication, without long-term current use of insulin ([place]) Patient A1c was 7.3 on 5/4. Patient was not adherent to Metformin for the past few weeks. Given known CAD could consider SGLT2-I or GLP1, however patient does not have insurance and cost would be a barrier. Will reassess at next A1c check in three month for needs to optimize T2DM therapy.  Acute systolic heart failure [facility]) Patient feeling better post angioplasty. Some lightheadness likely related to BP being 105/75. He was started on Lopressor and Losartan. However, given symptoms will titrate medications. He will continue on Plavix 75 mg daily and Aspirin for at least a year as discussed with Cardiology.  --Continue Losartan 25 mg bid --Will switch patient from metroprolol tartrate to Carvedilol 3.125 bid  --Continue DAPT --Continue atorvastatin 80 mg daily  --Follow up ECHO in two months to reevaluate LV function --Schedule for BMP in 2 weeks given recent ARBs initiation.    Marjie Skiff, MD [place] PGY-3

## 2019-01-07 NOTE — Patient Instructions (Signed)
It was great seeing you today! We have addressed the following issues today  1. Stop the metoprolol and start Carvedilol (coreg). 2. Continue with metformin as before, work on your diet and stay active. 3. Continue with Plavix aspirin. 4. Make an appointment to see me in about a month.  If we did any lab work today, and the results require attention, either me or my nurse will get in touch with you. If everything is normal, you will get a letter in mail and a message via . If you don't hear from [place] in two weeks, please give [place] a call. Otherwise, we look forward to seeing you again at your next visit. If you have any questions or concerns before then, please call the clinic at [phone].  Please bring all your medications to every doctors visit  Sign up for My Chart to have easy access to your labs results, and communication with your Primary care physician. Please ask Front Desk for some assistance.   Please check-out at the front desk before leaving the clinic.    Take Care,   Dr. Andy Gauss

## 2019-01-07 NOTE — Assessment & Plan Note (Addendum)
Patient A1c was 7.3 on 5/4. Patient was not adherent to Metformin for the past few weeks. Given known CAD could consider SGLT2-I or GLP1, however patient does not have insurance and cost would be a barrier. Will reassess at next A1c check in three month for needs to optimize T2DM therapy.

## 2019-01-07 NOTE — Telephone Encounter (Signed)
Pt is to call back for lab apt visit in the next week or so. Future order has been placed.

## 2019-01-07 NOTE — Assessment & Plan Note (Deleted)
Patient feeling better post angioplasty. Some lightheadness likely related to BP being 105/75. He was started on Lopressor and Losartan. However, given symptoms will titrate medications. He will continue on Plavix 75 mg daily and Aspirin for at least a year as discussed with Cardiology.  --Continue Losartan 25 mg bid --Will switch patient from metroprolol tartrate to succinate and will place on the lowest dose --Continue DAPT --Continue atorvastatin 80 mg daily

## 2019-01-07 NOTE — Assessment & Plan Note (Addendum)
Patient feeling better post angioplasty. Some lightheadness likely related to BP being 105/75. He was started on Lopressor and Losartan. However, given symptoms will titrate medications. He will continue on Plavix 75 mg daily and Aspirin for at least a year as discussed with Cardiology.  --Continue Losartan 25 mg bid --Will switch patient from metroprolol tartrate to Carvedilol 3.125 bid  --Continue DAPT --Continue atorvastatin 80 mg daily  --Follow up ECHO in two months to reevaluate LV function --Schedule for BMP in 2 weeks given recent ARBs initiation.

## 2019-01-09 ENCOUNTER — Telehealth: Payer: Self-pay | Admitting: Family Medicine

## 2019-01-09 NOTE — Telephone Encounter (Signed)
**  After Hours/ Emergency Line Call**  Received a call to report that [name] had called the emergency line with call back number [phone]. Patient did not answer phone and a voicemail was left asking that he go to the ED for an emergency or to call the emergency line back if he has an emergent question.   [place] Genella Bas, DO PGY-2, [facility] 01/09/2019 10:29 AM

## 2019-01-09 NOTE — Telephone Encounter (Signed)
**  After Hours/ Emergency Line Call**  Received a call to report that Andrew Huerta is continuing to experience mild chest pain along with fatigue in lightheadedness without any falls.  Patient was recently admitted to the hospital and had angioplasty performed.  He was started on metoprolol but this was switched at recent office visit on 5/8 to Coreg due to his symptoms of lightheadedness and dizziness- thought to be secondary to patient's BP being 105/75 in office.    Patient does not have a blood pressure cuff at home.  He has been holding his home dose of so that he may exercise as instructed without symptoms.  Patient instructed to take his nightly dose with he will then be going to bed.  Scheduled an appointment with our clinic on 5/11 at 8:50 AM for blood pressure check as well as possible EKG given reported continuance of chest pain.  Patient has not had any contact with any COVID positive patients, denies any fevers, shortness of breath or new onset cough.  Recommended that if his chest pain gets much worse or he symptoms worsen then he is to go to the emergency room..  Red flags discussed.  Will forward to PCP.  Martin[place] Lashe Oliveira, DO PGY-2, [facility] 01/09/2019 11:19 AM

## 2019-01-10 ENCOUNTER — Ambulatory Visit (INDEPENDENT_AMBULATORY_CARE_PROVIDER_SITE_OTHER): Payer: Self-pay | Admitting: Family Medicine

## 2019-01-10 ENCOUNTER — Other Ambulatory Visit: Payer: Self-pay

## 2019-01-10 VITALS — BP 102/68 | HR 84 | Wt 158.0 lb

## 2019-01-10 DIAGNOSIS — R42 Dizziness and giddiness: Secondary | ICD-10-CM | POA: Insufficient documentation

## 2019-01-10 NOTE — Assessment & Plan Note (Addendum)
Patient presents today complaining of dizziness and fatigue. Patient BP today is 102/68 which is likely causing patient symptoms. Patient has a new diagnosis of HFrEF and was started on Losartan 25 mg and Carvedilol 3.125 mg bid. BP continue to be low on current regimen. Will Have patient stopped ARBs and continue with B-Blocker. He has an appointment with Cardiology on 5/20 when they will further discuss regimen and dose titration. Patient will definitely benefit from ACE-I or ARBs given T2DM beyond just CHF management. We will also consider GLP1 or SGLT2-I at next office visit.

## 2019-01-10 NOTE — Progress Notes (Signed)
Subjective:    Patient ID: Andrew Huerta, male    DOB: 07-17-1966, 53 y.o.   MRN: [id number]   CC: Dizziness   HPI: Patient is a 53 yo male who presents today to follow up on recent hospitalization for chest pain which subsequently revealed significant vessels disease requiring stenting. Patient was discharged on 5/6 from the hospital and was seen by PCP on 5/8 for hospital follow up. Patient was reporting lightheadedness at the time of the visit and had low BP. Patient was started on Metoprolol and losartan at discharge from hospital. Given symptoms, discussing curtailing BP medications. He was started on low dose coreg and continued on losartan. Over the weekend, patient reports that he continue to have dizziness. He called the emergency line and was told to hold his morning dose of Coreg on Sunday. This morning he only took his Losartan and is still feeling a bit dizzy.  Smoking status reviewed   ROS: all other systems were reviewed and are negative other than in the HPI   Past Medical History:  Diagnosis Date  . Adrenal tumor    a. s/p adrenal gland resection at [facility]. left side removal per patient  . Anxiety   . Barrett's esophagus    EGD - 11/27/09 - short segment of Barrett's  . CAD (coronary artery disease)    a. 04/27/14 [place] s/p DES to mLAD and DES to mLCx  . Chronic back pain    upper and lower per patient  . Chronic chest pain   . Degenerative joint disease    Bilateral knees. Significant knee pain since playing football in high school. also in back per patient  . Depression   . Diabetes mellitus type 2, controlled ([place]) 08/29/2015   Diagnosed by A1c 7.6% during 08/26/15 hospital admission for chest pain  . Gastroesophageal reflux disease with hiatal hernia   . GERD (gastroesophageal reflux disease)   . Hiatal hernia    EGD - 11/27/2009  . Hyperlipidemia   . Hypertension   . Low back pain   . Primary hyperaldosteronism ([place]) 01/22/2012   S/P adrenalectomy     . PTSD  (post-traumatic stress disorder)   . Sleep apnea   . Stone, kidney     Past Surgical History:  Procedure Laterality Date  . ADRENALECTOMY  10/2013  . CARDIAC CATHETERIZATION  05/01/2014   Patent stents, other disease unchanged  . CARDIAC CATHETERIZATION  04/27/2014   Procedure: CORONARY STENT INTERVENTION;  Surgeon: Peter M Martinique, MD;  Location[facility] CATH LAB;  Service: Cardiovascular;;  DES mid Cx  DES mid LAD  . CARDIAC CATHETERIZATION N/A 03/22/2015   Procedure: Left Heart Cath and Coronary Angiography;  Surgeon: Sherren Mocha, MD;  Location: [place] CV LAB;  Service: Cardiovascular;  Laterality: N/A;  . CORONARY ANGIOPLASTY WITH STENT PLACEMENT  04/27/2014   3.0 x 16 mm Promus DES to the mid LAD and 3.5 x 28 mm Promus to the mid LCx, otherwise 20-30 percent lesions, EF 55%  . CORONARY STENT INTERVENTION N/A 01/04/2019   Procedure: CORONARY STENT INTERVENTION;  Surgeon: Troy Sine, MD;  Location: [place] CV LAB;  Service: Cardiovascular;  Laterality: N/A;  . KIDNEY STONE SURGERY  X 1  . KNEE ARTHROPLASTY Right 1984  . KNEE ARTHROSCOPY Right X 6  . LEFT HEART CATH AND CORONARY ANGIOGRAPHY N/A 01/04/2019   Procedure: LEFT HEART CATH AND CORONARY ANGIOGRAPHY;  Surgeon: Troy Sine, MD;  Location: [place] CV LAB;  Service:  Cardiovascular;  Laterality: N/A;  . LEFT HEART CATHETERIZATION WITH CORONARY ANGIOGRAM N/A 04/27/2014   Procedure: LEFT HEART CATHETERIZATION WITH CORONARY ANGIOGRAM;  Surgeon: Peter M Martinique, MD;  Location: [facility] CATH LAB;  Service: Cardiovascular;  Laterality: N/A;  . LEFT HEART CATHETERIZATION WITH CORONARY ANGIOGRAM N/A 05/01/2014   Procedure: LEFT HEART CATHETERIZATION WITH CORONARY ANGIOGRAM;  Surgeon: Burnell Blanks, MD;  Location: [facility] CATH LAB;  Service: Cardiovascular;  Laterality: N/A;  . LITHOTRIPSY  X 2    Past medical history, surgical, family, and social history reviewed and updated in the EMR as appropriate.  Objective:  BP 102/68    Pulse 84   Wt 158 lb (71.7 kg)   SpO2 97%   BMI 24.75 kg/m   Vitals and nursing note reviewed  General: NAD, pleasant, able to participate in exam Cardiac: RRR, normal heart sounds, no murmurs. 2+ radial and PT pulses bilaterally Respiratory: CTAB, normal effort, No wheezes, rales or rhonchi Abdomen: soft, nontender, nondistended, no hepatic or splenomegaly, +BS Extremities: no edema or cyanosis. WWP. Skin: warm and dry, no rashes noted Neuro: alert and oriented x4, no focal deficits Psych: Normal affect and mood   Assessment & Plan:   Dizziness Patient presents today complaining of dizziness and fatigue. Patient BP today is 102/68 which is likely causing patient symptoms. Patient has a new diagnosis of HFrEF and was started on Losartan 25 mg and Carvedilol 3.125 mg bid. BP continue to be low on current regimen. Will Have patient stopped ARBs and continue with B-Blocker. He has an appointment with Cardiology on 5/20 when they will further discuss regimen and dose titration. Patient will definitely benefit from ACE-I or ARBs given T2DM beyond just CHF management. We will also consider GLP1 or SGLT2-I at next office visit.    Marjie Skiff, MD [place] PGY-3

## 2019-01-12 ENCOUNTER — Other Ambulatory Visit: Payer: Self-pay

## 2019-01-12 MED ORDER — PANTOPRAZOLE SODIUM 40 MG PO TBEC: 40.0000 mg | DELAYED_RELEASE_TABLET | Freq: Every day | ORAL | 0 refills | Status: DC

## 2019-01-14 ENCOUNTER — Ambulatory Visit (INDEPENDENT_AMBULATORY_CARE_PROVIDER_SITE_OTHER): Payer: Self-pay | Admitting: Family Medicine

## 2019-01-14 ENCOUNTER — Ambulatory Visit (HOSPITAL_COMMUNITY)
Admission: RE | Admit: 2019-01-14 | Discharge: 2019-01-14 | Disposition: A | Discharge status: Home / Self Care | Payer: Self-pay | Source: Ambulatory Visit | Attending: Family Medicine | Admitting: Family Medicine

## 2019-01-14 ENCOUNTER — Other Ambulatory Visit: Payer: Self-pay

## 2019-01-14 VITALS — BP 103/71 | HR 88 | Wt 158.0 lb

## 2019-01-14 DIAGNOSIS — I25118 Atherosclerotic heart disease of native coronary artery with other forms of angina pectoris: Secondary | ICD-10-CM

## 2019-01-14 DIAGNOSIS — I5021 Acute systolic (congestive) heart failure: Secondary | ICD-10-CM

## 2019-01-14 DIAGNOSIS — R079 Chest pain, unspecified: Secondary | ICD-10-CM

## 2019-01-14 DIAGNOSIS — E119 Type 2 diabetes mellitus without complications: Secondary | ICD-10-CM

## 2019-01-14 MED ORDER — METFORMIN HCL 500 MG PO TABS: 500.0000 mg | ORAL_TABLET | Freq: Two times a day (BID) | ORAL | 3 refills | Status: DC

## 2019-01-14 NOTE — Patient Instructions (Addendum)
It was great to see you!  Our plans for today:  - Your EKG was unchanged from prior. - Take carvedilol twice daily, DO NOT take losartan. - We decreased your metformin to 500mg  twice daily. This should help with your nausea and diarrhea. Follow up with your primary doctor as scheduled for your diabetes follow up. - Call your cardiologist to get an appointment sooner than 01/19/19.  Take care and seek immediate care sooner if you develop any concerns.   Dr. [facility]

## 2019-01-14 NOTE — Assessment & Plan Note (Addendum)
Unclear etiology. Differential includes chronic chest pain, reperfusion injury, MSK pain, GERD. Not consistent with pleurisy. As patient describes pain different than prior ischemic pain and atypical symptoms for cardiac, as well as unchanged EKG not likely cardiac etiology, although given recent stenting and pain since discharge, recommend Cardiology follow up sooner than scheduled, if possible. Not reproducible, unlikely MSK. No rashes on exam. Could be related to GERD as patient also experiencing nausea and has not taken protonix recently. Advised to pick up from pharmacy. Reasonable to stay out from work until evaluated by Cardiology.

## 2019-01-14 NOTE — Progress Notes (Signed)
Subjective:   Patient ID: Andrew Huerta    DOB: 07-18-1966, 53 y.o. male   MRN: [id number]  Andrew Huerta is a 53 y.o. male with a history of HTN, CAD, acute systolic HF, GERD w/ Barrett's esophagus, sleep apnea, T2DM, OA, PTSD, RLS  here for   HPI - hospitalized 5/4-5/6, found to have multivessel CAD now s/p stenting. LVEF 35-40% at that time. Started DAPT, BB, statin. Seen for hospital f/u 5/8, meds titrated due to lightheadedness. Seen 5/11 for dizziness with lower BP, ARB stopped. - Endorses nausea, vomiting, diarrhea since coming home from the hospital, stating food "runs through him." Denies missed doses of meds although has been taking losartan and stopped carvedilol instead of stopping ARB and continuing carvedilol. - No sick contacts - Also having chest pain under his left breast that he describes as a "weird irritation, like joint pain." Describes as burning. Experiences this with exertion without shortness of breath. States chest pain is different from chest pain that brought him to the hospital, that pain felt like someone was punching him in the chest. This pain sometimes will last 10-15 mins and sometimes all day. Pain comes and goes. No rashes. Does not hurt to tough area, is not reproducible. Has not taking NTG. Does not radiate. - Also will have abnormal heart beat occasionally. - He started back working yesterday. He has not been lifting. He walks at work on flat ground for about 21min-1hr at a time. Exerting himself makes him tired and yesterday was noted to be pale after walking.  Review of Systems:  Per HPI.  [place], medications and smoking status reviewed.  Objective:   BP 103/71   Pulse 88   Wt 158 lb (71.7 kg)   BMI 24.75 kg/m  Vitals and nursing note reviewed.  General: well nourished, well developed, in no acute distress with non-toxic appearance CV: regular rate and rhythm without murmurs, rubs, or gallops, no lower extremity edema Lungs: clear to  auscultation bilaterally with normal work of breathing Abdomen: soft, non-tender, non-distended, normoactive bowel sounds Skin: warm, dry, no rashes or lesions Extremities: warm and well perfused, normal tone MSK: ROM grossly intact, strength intact, gait normal Neuro: Alert and oriented, speech normal  Assessment & Plan:   Chest pain on exertion Unclear etiology. Differential includes chronic chest pain, reperfusion injury, MSK pain, GERD. Not consistent with pleurisy. As patient describes pain different than prior ischemic pain and atypical symptoms for cardiac, as well as unchanged EKG not likely cardiac etiology, although given recent stenting and pain since discharge, recommend Cardiology follow up sooner than scheduled, if possible. Not reproducible, unlikely MSK. No rashes on exam. Could be related to GERD as patient also experiencing nausea and has not taken protonix recently. Advised to pick up from pharmacy. Reasonable to stay out from work until evaluated by Cardiology.   Acute systolic heart failure ([place]) Instructed patient to take coreg and NOT losartan as previously discussed due to dizziness and low BP.  Controlled type 2 diabetes mellitus without complication, without long-term current use of insulin ([place]) Patient previously had not been taking metformin prior to hospitalization, then started 1000mg  BID. Most recent A1c 7.3. Likely contributing to nausea, vomiting, diarrhea. Will decrease to 500mg  BID. Rx sent.  No orders of the defined types were placed in this encounter.  Meds ordered this encounter  Medications  . metFORMIN (GLUCOPHAGE) 500 MG tablet    Sig: Take 1 tablet (500 mg total) by mouth 2 (two) times daily  with a meal.    Dispense:  90 tablet    Refill:  [place], DO PGY-2, [place] [facility] 01/14/2019 10:12 AM

## 2019-01-14 NOTE — Assessment & Plan Note (Addendum)
Patient previously had not been taking metformin prior to hospitalization, then started 1000mg  BID. Most recent A1c 7.3. Likely contributing to nausea, vomiting, diarrhea. Will decrease to 500mg  BID. Rx sent.

## 2019-01-14 NOTE — Assessment & Plan Note (Signed)
Instructed patient to take coreg and NOT losartan as previously discussed due to dizziness and low BP.

## 2019-01-18 ENCOUNTER — Telehealth: Payer: Self-pay | Admitting: Physician Assistant

## 2019-01-18 NOTE — Telephone Encounter (Signed)
Mychart pending, smartphone, consent, pre reg complete 01/18/19 AF °

## 2019-01-19 ENCOUNTER — Encounter: Payer: Self-pay | Admitting: Physician Assistant

## 2019-01-19 ENCOUNTER — Other Ambulatory Visit: Payer: Self-pay | Admitting: *Deleted

## 2019-01-19 ENCOUNTER — Telehealth (INDEPENDENT_AMBULATORY_CARE_PROVIDER_SITE_OTHER): Payer: Self-pay | Admitting: Physician Assistant

## 2019-01-19 DIAGNOSIS — E785 Hyperlipidemia, unspecified: Secondary | ICD-10-CM

## 2019-01-19 DIAGNOSIS — E7800 Pure hypercholesterolemia, unspecified: Secondary | ICD-10-CM

## 2019-01-19 DIAGNOSIS — G894 Chronic pain syndrome: Secondary | ICD-10-CM

## 2019-01-19 DIAGNOSIS — E119 Type 2 diabetes mellitus without complications: Secondary | ICD-10-CM

## 2019-01-19 DIAGNOSIS — I10 Essential (primary) hypertension: Secondary | ICD-10-CM

## 2019-01-19 DIAGNOSIS — I119 Hypertensive heart disease without heart failure: Secondary | ICD-10-CM

## 2019-01-19 DIAGNOSIS — Z79899 Other long term (current) drug therapy: Secondary | ICD-10-CM

## 2019-01-19 DIAGNOSIS — I25119 Atherosclerotic heart disease of native coronary artery with unspecified angina pectoris: Secondary | ICD-10-CM

## 2019-01-19 MED ORDER — HYDROCODONE-ACETAMINOPHEN 5-325 MG PO TABS: 1.0000 | ORAL_TABLET | Freq: Three times a day (TID) | ORAL | 0 refills | Status: DC | PRN

## 2019-01-19 MED ORDER — RANOLAZINE ER 500 MG PO TB12: 500.0000 mg | ORAL_TABLET | Freq: Two times a day (BID) | ORAL | 2 refills | Status: DC

## 2019-01-19 MED ORDER — PANTOPRAZOLE SODIUM 40 MG PO TBEC: 40.0000 mg | DELAYED_RELEASE_TABLET | Freq: Every day | ORAL | 3 refills | Status: DC

## 2019-01-19 NOTE — Patient Instructions (Signed)
Medication Instructions:   STOP METOPROLOL   If you need a refill on your cardiac medications before your next appointment, please call your pharmacy.   Lab work:  You will need to come into the office to have labs (blood work) drawn:  Fast Lipid Liver funcion  If you have labs (blood work) drawn today and your tests are completely normal, you will receive your results only by: Marland Kitchen MyChart Message (if you have MyChart) OR . A paper copy in the mail If you have any lab test that is abnormal or we need to change your treatment, we will call you to review the results.  Testing/Procedures:  NONE ordered at this time of appointment   Follow-Up: At [facility], you and your health needs are our priority.  As part of our continuing mission to provide you with exceptional heart care, we have created designated Provider Care Teams.  These Care Teams include your primary Cardiologist (physician) and Advanced Practice Providers (APPs -  Physician Assistants and Nurse Practitioners) who all work together to provide you with the care you need, when you need it. You will need a follow up appointment in 2-3 weeks.  Please call our office 2 months in advance to schedule this appointment.  You may see Peter Martinique, MD or one of the following Advanced Practice Providers on your designated Care Team: [place], [place] . Fabian Sharp, PA-C  Any Other Special Instructions Will Be Listed Below (If Applicable).

## 2019-01-19 NOTE — Progress Notes (Signed)
Virtual Visit via Video Note   This visit type was conducted due to national recommendations for restrictions regarding the COVID-19 Pandemic (e.g. social distancing) in an effort to limit this patient's exposure and mitigate transmission in our community.  Due to his co-morbid illnesses, this patient is at least at moderate risk for complications without adequate follow up.  This format is felt to be most appropriate for this patient at this time.  All issues noted in this document were discussed and addressed.  A limited physical exam was performed with this format.  Please refer to the patient's chart for his consent to telehealth for [facility].   Date:  01/19/2019   ID:  Andrew Huerta, DOB 04-Jul-1966, MRN [id number]  Patient Location: Home Provider Location: Home  PCP:  Marjie Skiff, MD  Cardiologist:  Peter Martinique, MD  Electrophysiologist:  None   Evaluation Performed:  Follow-Up Visit  Chief Complaint:  followup  History of Present Illness:    Andrew Huerta is a 53 y.o. male with PMH of CAD, chronic back pain, DM 2, Barrett's esophagus, history of adrenal tumor s/p adrenal gland resection at [facility], hypertension, hyperlipidemia, primary hyperaldosteronism s/p adrenalectomy, and obstructive sleep apnea.  Patient had a abnormal stress test in 2015.  Subsequent cardiac catheterization showed two-vessel disease.  He underwent stenting of mid LAD and mid left circumflex.  Due to recurrent chest discomfort, he had repeat cardiac catheterization later in the year that showed patent stents.  He was readmitted in November 2015 for recurrent chest pain.  Myoview was normal.  Last cardiac catheterization was performed in July 2016 which showed patent stents.  More recently, patient presented to the hospital on 01/03/2019 with vague chest discomfort.  Serial troponin was negative.  Hemoglobin A1c 7.3.  Echocardiogram obtained on 01/04/2019 showed EF 35 to 40%, new inferior wall motion  abnormality.  Cardiac catheterization performed on 01/04/2019 revealed a 99% mid RCA disease, 100% mid to distal RCA lesion with distal left to right collateralization from LAD, 40% ostial to proximal LAD disease, widely patent proximal LAD stent, 80% proximal to mid LAD lesion treated with a 2.5 x 18 mm resolute Onyx DES postdilated to 2.6 mm, patent ostial to proximal left circumflex stent.  Lipid panel obtained during this admission showed total cholesterol 230, HDL 44, LDL 136, triglyceride 150.  Postprocedure, patient was placed on aspirin and Plavix with recommendation to continue dual antiplatelet therapy for at least 12 months.  Patient was seen by PCP on 01/07/2019, metoprolol was switched to carvedilol.  Based on last office visit on 01/12/2019, he was having recurrent chest discomfort.  Patient was contacted today via doximity video conference visit.  According to the patient, the initial chest pain he presented to the hospital was left upper chest discomfort.  Since the stent placement however he has been noticing some chest pain under his left breast which is in a different location than his usual angina.  Interestingly enough, this new chest pain also occurs with exertion.  He has went back to work at [employer] however is concerned about going back to [place] to work his second job due to amount of workload.  He says he is currently off at [facility] until the 27th, from the cardiology perspective, I think he is good to return to work at [employer] after the 27th.  I will write him a new work note in this case.  As far as his chest pain, the exertional component of the  chest pain is concerning.  However his blood pressure does not allow me to add any long-acting nitrate or amlodipine.  I recommended addition of Ranexa 500 mg twice daily is antianginal drug.  He will need a 2 to 3 weeks fasting lipid panel and LFT.  I plan to arrange for the patient to return to the office to be seen in the office in 2  weeks for reassessment of the chest pain.  The patient does not have symptoms concerning for COVID-19 infection (fever, chills, cough, or new shortness of breath).    Past Medical History:  Diagnosis Date   Adrenal tumor    a. s/p adrenal gland resection at [facility]. left side removal per patient   Anxiety    Barrett's esophagus    EGD - 11/27/09 - short segment of Barrett's   CAD (coronary artery disease)    a. 04/27/14 [place] s/p DES to mLAD and DES to mLCx   Chronic back pain    upper and lower per patient   Chronic chest pain    Degenerative joint disease    Bilateral knees. Significant knee pain since playing football in high school. also in back per patient   Depression    Diabetes mellitus type 2, controlled ([place]) 08/29/2015   Diagnosed by A1c 7.6% during 08/26/15 hospital admission for chest pain   Gastroesophageal reflux disease with hiatal hernia    GERD (gastroesophageal reflux disease)    Hiatal hernia    EGD - 11/27/2009   Hyperlipidemia    Hypertension    Low back pain    Primary hyperaldosteronism ([place]) 01/22/2012   S/P adrenalectomy      PTSD (post-traumatic stress disorder)    Sleep apnea    Stone, kidney    Past Surgical History:  Procedure Laterality Date   ADRENALECTOMY  10/2013   CARDIAC CATHETERIZATION  05/01/2014   Patent stents, other disease unchanged   CARDIAC CATHETERIZATION  04/27/2014   Procedure: CORONARY STENT INTERVENTION;  Surgeon: Peter M Martinique, MD;  Location: [facility] CATH LAB;  Service: Cardiovascular;;  DES mid Cx  DES mid LAD   CARDIAC CATHETERIZATION N/A 03/22/2015   Procedure: Left Heart Cath and Coronary Angiography;  Surgeon: Sherren Mocha, MD;  Location: [place] CV LAB;  Service: Cardiovascular;  Laterality: N/A;   CORONARY ANGIOPLASTY WITH STENT PLACEMENT  04/27/2014   3.0 x 16 mm Promus DES to the mid LAD and 3.5 x 28 mm Promus to the mid LCx, otherwise 20-30 percent lesions, EF 55%   CORONARY STENT INTERVENTION N/A  01/04/2019   Procedure: CORONARY STENT INTERVENTION;  Surgeon: Troy Sine, MD;  Location: [place] CV LAB;  Service: Cardiovascular;  Laterality: N/A;   KIDNEY STONE SURGERY  X 1   KNEE ARTHROPLASTY Right 1984   KNEE ARTHROSCOPY Right X 6   LEFT HEART CATH AND CORONARY ANGIOGRAPHY N/A 01/04/2019   Procedure: LEFT HEART CATH AND CORONARY ANGIOGRAPHY;  Surgeon: Troy Sine, MD;  Location: [place] CV LAB;  Service: Cardiovascular;  Laterality: N/A;   LEFT HEART CATHETERIZATION WITH CORONARY ANGIOGRAM N/A 04/27/2014   Procedure: LEFT HEART CATHETERIZATION WITH CORONARY ANGIOGRAM;  Surgeon: Peter M Martinique, MD;  [facility] LAB;  Service: Cardiovascular;  Laterality: N/A;   LEFT HEART CATHETERIZATION WITH CORONARY ANGIOGRAM N/A 05/01/2014   Procedure: LEFT HEART CATHETERIZATION WITH CORONARY ANGIOGRAM;  Surgeon: Burnell Blanks, MD;  Location: [facility] CATH LAB;  Service: Cardiovascular;  Laterality: N/A;   LITHOTRIPSY  X 2  Current Meds  Medication Sig   aspirin EC 81 MG tablet Take 81 mg by mouth daily.   atorvastatin (LIPITOR) 80 MG tablet Take 1 tablet (80 mg total) by mouth daily.   carvedilol (COREG) 3.125 MG tablet Take 1 tablet (3.125 mg total) by mouth 2 (two) times daily with a meal.   clopidogrel (PLAVIX) 75 MG tablet Take 1 tablet (75 mg total) by mouth daily.   HYDROcodone-acetaminophen (NORCO/VICODIN) 5-325 MG tablet Take 1-2 tablets by mouth every 8 (eight) hours as needed for moderate pain.   metFORMIN (GLUCOPHAGE) 500 MG tablet Take 1 tablet (500 mg total) by mouth 2 (two) times daily with a meal.   metoprolol tartrate (LOPRESSOR) 25 MG tablet Take 1 tablet (25 mg total) by mouth 2 (two) times daily.     Allergies:   Cortisone acetate; Darvocet [propoxyphene n-acetaminophen]; Nsaids; Xanax [alprazolam]; and Tape   Social History   Tobacco Use   Smoking status: Never Smoker   Smokeless tobacco: Never Used  Substance Use Topics   Alcohol  use: Yes    Alcohol/week: 0.0 standard drinks    Comment: "seldom" per patient   Drug use: Yes    Types: Marijuana     Family Hx: The patient's family history includes Cancer in his father; Diabetes in his father and paternal grandmother; Heart attack in his paternal grandfather.  ROS:   Please see the history of present illness.     All other systems reviewed and are negative.   Prior CV studies:   The following studies were reviewed today:  Echo 01/04/2019 IMPRESSIONS    1. The left ventricle has moderately reduced systolic function, with an ejection fraction of 35-40%. The cavity size was moderately dilated. There is mildly increased left ventricular wall thickness. Left ventricular diastolic parameters were normal.  2. New inferior RWMA since previous study.  3. The right ventricle has normal systolic function. The cavity was normal. There is no increase in right ventricular wall thickness.  4. Left atrial size was mildly dilated.  5. Mild thickening of the mitral valve leaflet.  6. The aortic valve is tricuspid. Mild thickening of the aortic valve. Mild calcification of the aortic valve. No stenosis of the aortic valve.    Cath 01/04/2019  Mid RCA lesion is 99% stenosed.  Mid RCA to Dist RCA lesion is 100% stenosed.  Ost LAD to Prox LAD lesion is 40% stenosed.  Previously placed Prox LAD stent (unknown type) is widely patent.  Prox LAD to Mid LAD lesion is 80% stenosed.  A stent was successfully placed.  Post intervention, there is a 0% residual stenosis.  Post intervention, there is a 0% residual stenosis.  Mid LAD lesion is 30% stenosed.  Previously placed Ost Cx to Prox Cx stent (unknown type) is widely patent.   Multivessel CAD with 40% smooth ostial proximal stenosis of the LAD with a septal perforating artery that arises from the ostium and has a 90% stenosis (not able to be depicted in the diagram), widely patent stent proximally followed by new 80%  stenosis the on the stented segment and proximal to the mid diagonal vessels.  There is 30% LAD stenosis beyond the mid diagonal vessels; patent moderate-sized ramus intermediate vessel; patent proximal left circumflex stent; and diffuse 99% to 100% occlusion of the mid distal RCA with evidence for moderate left to right collateralization from the LAD, septal perforating arteries, and LCX.  LVEDP 17 mmHg.  Successful PCI to the 80% LAD stenosis with  ultimate insertion of a 2.5 x 18 mm Resolute Onyx DES stent postdilated to 2.6 mm with the 80% stenosis being reduced to 0%.  RECOMMENDATION: DAPT for minimum of 1 year and possibly long-term due to the patient's significant CAD history.  Aggressive lipid-lowering therapy with target LDL less than 70.  Medical therapy for RCA occlusion with collateralization. Labs/Other Tests and Data Reviewed:    EKG:  An ECG dated 01/14/2019 was personally reviewed today and demonstrated:  Normal sinus rhythm with Q waves in the inferior lead  Recent Labs: 10/01/2018: ALT 18 01/05/2019: BUN 12; Creatinine, Ser 0.83; Hemoglobin 13.1; Platelets 205; Potassium 3.6; Sodium 139   Recent Lipid Panel Lab Results  Component Value Date/Time   CHOL 230 (H) 01/04/2019 02:38 AM   CHOL 235 (H) 10/01/2018 09:12 AM   TRIG 150 (H) 01/04/2019 02:38 AM   HDL 44 01/04/2019 02:38 AM   HDL 55 10/01/2018 09:12 AM   CHOLHDL 5.2 01/04/2019 02:38 AM   LDLCALC 156 (H) 01/04/2019 02:38 AM   LDLCALC 152 (H) 10/01/2018 09:12 AM   LDLDIRECT 169 (H) 07/08/2011 09:44 AM    Wt Readings from Last 3 Encounters:  01/14/19 158 lb (71.7 kg)  01/10/19 158 lb (71.7 kg)  01/07/19 156 lb (70.8 kg)     Objective:    Vital Signs:  There were no vitals taken for this visit.   VITAL SIGNS:  reviewed  ASSESSMENT & PLAN:    1. CAD: Recently underwent DES to LAD.  Since discharge, he has left upper chest pain has completely resolved, however he started having pain under his left breast that  occurs with exertion.  I started him on a course of Ranexa for antianginal purposes.  The location of the chest discomfort is different from the recent angina, however is a exertional component is concerning.  I will bring the patient back in 2 weeks for reassessment.  2. Hypertension: He was unable to provide me with any vital signs today.  Continue on current therapy  3. Hyperlipidemia: We discussed the importance of lipid control, continue on 80 mg Lipitor.  Fasting lipid panel and LFT in 6 to 8 weeks.  4. DM 2: Managed by primary care provider  COVID-19 Education: The signs and symptoms of COVID-19 were discussed with the patient and how to seek care for testing (follow up with PCP or arrange E-visit).  The importance of social distancing was discussed today.  Time:   Today, I have spent 17 minutes with the patient with telehealth technology discussing the above problems.     Medication Adjustments/Labs and Tests Ordered: Current medicines are reviewed at length with the patient today.  Concerns regarding medicines are outlined above.   Tests Ordered: No orders of the defined types were placed in this encounter.   Medication Changes: No orders of the defined types were placed in this encounter.   Disposition:  Follow up in 2 week(s)  Signed, Almyra Deforest, PA  01/19/2019 2:16 PM    [facility]

## 2019-01-27 ENCOUNTER — Telehealth: Payer: Self-pay | Admitting: Physician Assistant

## 2019-01-27 NOTE — Telephone Encounter (Signed)
Pt state he just checked his mail and letter was in his mailbox. Pt state he no longer need the letter faxed.

## 2019-01-27 NOTE — Telephone Encounter (Signed)
Follow Up:   Pt says Andrew Huerta was supposed to be doing a letter for work. He still have not received it. Please fax it now to [phone].

## 2019-02-03 ENCOUNTER — Ambulatory Visit: Payer: Self-pay | Admitting: Physician Assistant
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
## 2019-02-08 ENCOUNTER — Encounter: Payer: Self-pay | Admitting: Family Medicine

## 2019-02-08 ENCOUNTER — Ambulatory Visit (INDEPENDENT_AMBULATORY_CARE_PROVIDER_SITE_OTHER): Payer: Self-pay | Admitting: Family Medicine

## 2019-02-08 ENCOUNTER — Other Ambulatory Visit: Payer: Self-pay

## 2019-02-08 VITALS — BP 112/70 | HR 91

## 2019-02-08 DIAGNOSIS — I25118 Atherosclerotic heart disease of native coronary artery with other forms of angina pectoris: Secondary | ICD-10-CM

## 2019-02-08 DIAGNOSIS — N521 Erectile dysfunction due to diseases classified elsewhere: Secondary | ICD-10-CM

## 2019-02-08 DIAGNOSIS — N529 Male erectile dysfunction, unspecified: Secondary | ICD-10-CM | POA: Insufficient documentation

## 2019-02-08 MED ORDER — SILDENAFIL CITRATE 25 MG PO TABS: 25.0000 mg | ORAL_TABLET | Freq: Every day | ORAL | 0 refills | Status: DC | PRN

## 2019-02-08 NOTE — Progress Notes (Signed)
Subjective:    Patient ID: Andrew Huerta, male    DOB: [birth year], 53 y.o.   MRN: [id number]   CC: Follow-up after recent cardiac stenting  HPI: Patient is a 53 year old male with a complex past medical history who presents today to follow-up on recent hospitalization requiring drug-eluting stent for significant vessel disease.  Patient reports that he has been feeling better and the dizziness has resolved.  He was seen by cardiology on 5/20 (telemedicine) and was started on Ranexa but reported he has been not taking it because he cannot afford it.  Patient was supposed to follow-up with cardiology 2 weeks after last office visit but has not done so yet.  Patient is otherwise feeling well.  Patient reports occasional dysfunction since last hospitalization.  Patient denies any similar problems in the past.  He continues to take his medication as instructed.  He will likely need a repeat lipid panel in 6 to 8 weeks per cardiology.  Smoking status reviewed   ROS: all other systems were reviewed and are negative other than in the HPI   Past Medical History:  Diagnosis Date   Adrenal tumor    a. s/p adrenal gland resection at [facility]. left side removal per patient   Anxiety    Barrett's esophagus    EGD - 11/27/09 - short segment of Barrett's   CAD (coronary artery disease)    a. 04/27/14 [place] s/p DES to mLAD and DES to mLCx   Chronic back pain    upper and lower per patient   Chronic chest pain    Degenerative joint disease    Bilateral knees. Significant knee pain since playing football in high school. also in back per patient   Depression    Diabetes mellitus type 2, controlled ([place]) 08/29/2015   Diagnosed by A1c 7.6% during 08/26/15 hospital admission for chest pain   Gastroesophageal reflux disease with hiatal hernia    GERD (gastroesophageal reflux disease)    Hiatal hernia    EGD - 11/27/2009   Hyperlipidemia    Hypertension    Low back pain    Primary  hyperaldosteronism ([place]) 01/22/2012   S/P adrenalectomy      PTSD (post-traumatic stress disorder)    Sleep apnea    Stone, kidney     Past Surgical History:  Procedure Laterality Date   ADRENALECTOMY  10/2013   CARDIAC CATHETERIZATION  05/01/2014   Patent stents, other disease unchanged   CARDIAC CATHETERIZATION  04/27/2014   Procedure: CORONARY STENT INTERVENTION;  Surgeon: Peter M Martinique, MD;  Location: [facility] CATH LAB;  Service: Cardiovascular;;  DES mid Cx  DES mid LAD   CARDIAC CATHETERIZATION N/A 03/22/2015   Procedure: Left Heart Cath and Coronary Angiography;  Surgeon: Sherren Mocha, MD;  Location: [facility] CV LAB;  Service: Cardiovascular;  Laterality: N/A;   CORONARY ANGIOPLASTY WITH STENT PLACEMENT  04/27/2014   3.0 x 16 mm Promus DES to the mid LAD and 3.5 x 28 mm Promus to the mid LCx, otherwise 20-30 percent lesions, EF 55%   CORONARY STENT INTERVENTION N/A 01/04/2019   Procedure: CORONARY STENT INTERVENTION;  Surgeon: Troy Sine, MD;  Location: [facility] CV LAB;  Service: Cardiovascular;  Laterality: N/A;   KIDNEY STONE SURGERY  X 1   KNEE ARTHROPLASTY Right 1984   KNEE ARTHROSCOPY Right X 6   LEFT HEART CATH AND CORONARY ANGIOGRAPHY N/A 01/04/2019   Procedure: LEFT HEART CATH AND CORONARY ANGIOGRAPHY;  Surgeon: Troy Sine,  MD;  Location: [facility] CV LAB;  Service: Cardiovascular;  Laterality: N/A;   LEFT HEART CATHETERIZATION WITH CORONARY ANGIOGRAM N/A 04/27/2014   Procedure: LEFT HEART CATHETERIZATION WITH CORONARY ANGIOGRAM;  Surgeon: Peter M Martinique, MD;  Location: [facility] CATH LAB;  Service: Cardiovascular;  Laterality: N/A;   LEFT HEART CATHETERIZATION WITH CORONARY ANGIOGRAM N/A 05/01/2014   Procedure: LEFT HEART CATHETERIZATION WITH CORONARY ANGIOGRAM;  Surgeon: Burnell Blanks, MD;  Location: [facility] CATH LAB;  Service: Cardiovascular;  Laterality: N/A;   LITHOTRIPSY  X 2    Past medical history, surgical, family, and social history reviewed  and updated in the EMR as appropriate.  Objective:  BP 112/70    Pulse 91    SpO2 97%   Vitals and nursing note reviewed  General: NAD, pleasant, able to participate in exam Cardiac: RRR, normal heart sounds, no murmurs. 2+ radial and PT pulses bilaterally Respiratory: CTAB, normal effort, No wheezes, rales or rhonchi Abdomen: soft, nontender, nondistended, no hepatic or splenomegaly, +BS Extremities: no edema or cyanosis. WWP. Skin: warm and dry, no rashes noted Neuro: alert and oriented x4, no focal deficits Psych: Normal affect and mood   Assessment & Plan:   CAD Patient with recent stenting currently on Plavix and aspirin.  No anginal pain.  As noted started taking Ranexa due to affordability issues.  Discussed the use of nitroglycerin which patient reports has never had to use.  He will continue to stay on Coreg 3.125 mg twice daily given improvement in dizziness and blood pressure.  Patient will call and schedule follow-up appointment with cardiology.  We will recheck his lipid panel at next office visit in about 4 weeks.  Erectile dysfunction due to diseases classified elsewhere This is a new problem for this patient.  Patient reports difficulty starting maintaining erection since last hospitalization.  Patient also has a history of large prostate but denies any nocturia or incomplete bladder emptying.  Do not think the patient will need to be started on tamsulosin.  Suspect the ED symptoms likely secondary to cardiac history with recent initiation of multiple blood pressure lowering medication which could be contributing.  Patient has requested a trial of Viagra.  Explained to patient that he can never take nitroglycerin with sildenafil.  Patient verbalized understanding. --Prescribed 10 tablets 25 mg of sildenafil.  Could increase to 50 mg maximum --Know to avoid nitroglycerin.    Marjie Skiff, MD [place] PGY-3

## 2019-02-08 NOTE — Assessment & Plan Note (Signed)
This is a new problem for this patient.  Patient reports difficulty starting maintaining erection since last hospitalization.  Patient also has a history of large prostate but denies any nocturia or incomplete bladder emptying.  Do not think the patient will need to be started on tamsulosin.  Suspect the ED symptoms likely secondary to cardiac history with recent initiation of multiple blood pressure lowering medication which could be contributing.  Patient has requested a trial of Viagra.  Explained to patient that he can never take nitroglycerin with sildenafil.  Patient verbalized understanding. --Prescribed 10 tablets 25 mg of sildenafil.  Could increase to 50 mg maximum --Know to avoid nitroglycerin.

## 2019-02-08 NOTE — Assessment & Plan Note (Signed)
Patient with recent stenting currently on Plavix and aspirin.  No anginal pain.  As noted started taking Ranexa due to affordability issues.  Discussed the use of nitroglycerin which patient reports has never had to use.  He will continue to stay on Coreg 3.125 mg twice daily given improvement in dizziness and blood pressure.  Patient will call and schedule follow-up appointment with cardiology.  We will recheck his lipid panel at next office visit in about 4 weeks.

## 2019-02-14 ENCOUNTER — Telehealth: Payer: Self-pay | Admitting: Family Medicine

## 2019-02-14 NOTE — Telephone Encounter (Signed)
Spoke with patient and he has a rash that started on both of his legs and it is now on his arms.  Some places are itchy and some are not.  Patient made an appt for tomorrow morning to be examined.  Jazmin Hartsell,CMA

## 2019-02-14 NOTE — Telephone Encounter (Signed)
Pt called stated that he's having medication reaction and have spots all over his legs, started 2weeks ago. Best phone # to contact [phone].

## 2019-02-15 ENCOUNTER — Other Ambulatory Visit: Payer: Self-pay

## 2019-02-15 ENCOUNTER — Ambulatory Visit (INDEPENDENT_AMBULATORY_CARE_PROVIDER_SITE_OTHER): Payer: Self-pay | Admitting: Family Medicine

## 2019-02-15 DIAGNOSIS — R21 Rash and other nonspecific skin eruption: Secondary | ICD-10-CM

## 2019-02-15 MED ORDER — LORATADINE 10 MG PO TBDP: 10.0000 mg | ORAL_TABLET | Freq: Every day | ORAL | 0 refills | Status: DC

## 2019-02-15 MED ORDER — LORATADINE 10 MG PO TBDP: 10.0000 mg | ORAL_TABLET | Freq: Every day | ORAL | 1 refills | Status: DC

## 2019-02-15 NOTE — Progress Notes (Signed)
   HPI 53 year old who presents with 1 week history of bilateral lower extremity rash.  Patient states that the rash started as small bumps on his legs that gradually got bigger.  Little bumps were there prior to his starting an over-the-counter medication called Cetaphil.  He states that the bumps got much bigger after starting his medication though.  He initially thought it was due to his antiplatelet medication, but when they do not go away to coming it checked out.  He has been using a new detergents, spent very little time outside.  Does states his dog is at his backyard occasionally, the dog is sleep in his bed.  He has tried nothing for the rash.  The only change he has had is taking his over-the-counter medication.  He states that he recently noticed these on his arms but much less severe than the legs.  CC: Bilateral lower extremity rash  ROS:   Review of Systems See HPI for ROS.   CC, SH/smoking status, and VS noted  Objective: BP 110/70   Pulse 87   SpO2 43%  Gen: 53 year old Caucasian male, no acute distress, resting comfortably CV: RRR, no murmur Resp: CTAB, no wheezes, non-labored Abd: SNTND, BS present, no guarding or organomegaly Neuro: Alert and oriented, Speech clear, No gross deficits Bilateral lower extremity: Well-circumscribed, raised, erythematous papules. Bilateral upper extremity: Well-circumscribed, raised, erythematous papules            Assessment and plan:  Rash Discussed case with Dr. Andria Frames.  Rash is likely histamine mediated reaction to this medication.  Will give Claritin 10 mg daily as needed for rash.  Advised patient that if he takes his over-the-counter medication again and the rash comes back, would recommend stopping the medication.   No orders of the defined types were placed in this encounter.   Meds ordered this encounter  Medications  . DISCONTD: loratadine (CLARITIN REDITABS) 10 MG dissolvable tablet    Sig: Take 1 tablet  (10 mg total) by mouth daily. As needed for allergy symptoms    Dispense:  60 tablet    Refill:  1  . loratadine (CLARITIN REDITABS) 10 MG dissolvable tablet    Sig: Take 1 tablet (10 mg total) by mouth daily. As needed for allergy symptoms    Dispense:  30 tablet    Refill:  0    Guadalupe Dawn MD PGY-2 Family Medicine Resident  02/17/2019 9:51 AM

## 2019-02-15 NOTE — Patient Instructions (Signed)
It was great meeting you today!  We think that your rash is due to an allergic reaction to the medication you are taking.  Typically a medication called a histamine blocker is a good option for this.  I sent Claritin to your pharmacy and gave you a good Rx coupon which should hopefully make this pretty cheap.  If you do not feel the Claritin is helping at all, and the rash continues even that you stop the Cetaphil please let us know and we will likely want to come back for an appointment.

## 2019-02-17 ENCOUNTER — Other Ambulatory Visit: Payer: Self-pay

## 2019-02-17 DIAGNOSIS — R21 Rash and other nonspecific skin eruption: Secondary | ICD-10-CM | POA: Insufficient documentation

## 2019-02-17 DIAGNOSIS — G894 Chronic pain syndrome: Secondary | ICD-10-CM

## 2019-02-17 MED ORDER — HYDROCODONE-ACETAMINOPHEN 5-325 MG PO TABS: 1.0000 | ORAL_TABLET | Freq: Three times a day (TID) | ORAL | 0 refills | Status: DC | PRN

## 2019-02-17 NOTE — Assessment & Plan Note (Addendum)
Discussed case with Dr. Andria Frames.  Rash is likely histamine mediated reaction to this medication.  Will give Claritin 10 mg daily as needed for rash.  Advised patient that if he takes his over-the-counter medication again and the rash comes back, would recommend stopping the medication.

## 2019-03-01 ENCOUNTER — Telehealth: Payer: Self-pay | Admitting: *Deleted

## 2019-03-01 ENCOUNTER — Telehealth (INDEPENDENT_AMBULATORY_CARE_PROVIDER_SITE_OTHER): Payer: Self-pay | Admitting: Family Medicine

## 2019-03-01 ENCOUNTER — Other Ambulatory Visit: Payer: Self-pay

## 2019-03-01 DIAGNOSIS — Z20822 Contact with and (suspected) exposure to covid-19: Secondary | ICD-10-CM

## 2019-03-01 DIAGNOSIS — R6889 Other general symptoms and signs: Secondary | ICD-10-CM

## 2019-03-01 NOTE — Telephone Encounter (Signed)
Spoke with patient, scheduled him for testing at [facility] today at 3:30 pm.  Testing protocol reviewed.

## 2019-03-01 NOTE — Telephone Encounter (Signed)
-----   Message from Martinique Shirley, DO sent at 03/01/2019  2:08 PM EDT ----- COVID Drive-Up Test Referral Criteria  Patient age: 53 y.o.  Symptoms: Cough, Muscle pain, and Sore throat  Underlying Conditions: Heart failure, HTN, and Diabetes  Is the patient a first responder? No  Does the patient live or work in a high risk or high density environment: No  Is the patient a COVID convalescent patient who is 14-28 days symptom-free and interested in donating plasma for use as a therapeutic product? No

## 2019-03-01 NOTE — Progress Notes (Signed)
[facility] Telemedicine Visit  Patient consented to have virtual visit. Method of visit: Video  Encounter participants: Patient: Andrew Huerta - located at home Provider: Martinique Valdemar Mcclenahan - located at home Others (if applicable): n/a  Chief Complaint: sore throat, cough, fatigue  HPI: Dry cough, back hurting more than normal and sore throat for the past couple of days. No fevers or chills. Patient works at [employer] and [employer]	[place], and he has heard covid-19 in the [place]. He also has some swelling in his neck. No hx of allergies, no trouble swallowing. He also endorses being more tired than usual. Started about 3 days ago. Denies shortness of breath. Cough has actually improved today. No hx of asthma or COPD, has heart issues and diabetes. Had heart cath done 2 months ago. No swelling in his legs, significant weight gain or orthopnea. Denies chest pain.   Patient does not know if this is just a regular cold, but he works around a lot of people regularly and would like to be tested for covid 19.   ROS: per HPI  Pertinent PMHx: CAD, HTN, CHF, T2DM  Exam:  Respiratory: appears comfortable on exam with no noticed SOB, able to speak in complete sentences  Assessment/Plan:  Viral URI/ Concern for Covid 19 Works in higher risk environment with some coworkers who had been exposed to COVID-19. He reports cough, body aches, fatigue and sore throat.   Would be reasonable to test given h/o CAD with recent stent, CHF and T2DM, as well as higher risk work [employer] pl[employer].   Staff message sent for COVID-19 drive-up testing.   Counseled on wearing a mask and avoiding social gatherings  ED precautions discussed and she expresses good understanding  Time spent during visit with patient: 7 minutes

## 2019-03-05 LAB — NOVEL CORONAVIRUS, NAA: SARS-CoV-2, NAA: NOT DETECTED

## 2019-03-07 ENCOUNTER — Telehealth: Payer: Self-pay | Admitting: Family Medicine

## 2019-03-07 NOTE — Telephone Encounter (Signed)
Pt is calling and checking on the status of getting his COVID test results. He called [facility] and they told him to call our office for the results.   Please call when available.

## 2019-03-07 NOTE — Telephone Encounter (Signed)
Spoke with patient and informed him that his results were negative.  He asked if he would need a note for work and I told him that was up to his employer.  Patient will let us know if he needs a letter and where we can fax it to.  [facility]

## 2019-03-21 ENCOUNTER — Other Ambulatory Visit: Payer: Self-pay | Admitting: Family Medicine

## 2019-03-21 DIAGNOSIS — G894 Chronic pain syndrome: Secondary | ICD-10-CM

## 2019-03-21 NOTE — Telephone Encounter (Signed)
Pt called to get a refill on hid VICODIN. Please give pt a call back.

## 2019-03-23 MED ORDER — HYDROCODONE-ACETAMINOPHEN 5-325 MG PO TABS: 1.0000 | ORAL_TABLET | Freq: Three times a day (TID) | ORAL | 0 refills | Status: DC | PRN

## 2019-03-23 NOTE — Telephone Encounter (Signed)
Patient calling checking the status of prescription.

## 2019-03-23 NOTE — Telephone Encounter (Signed)
Reviewed prior notes and PMP. Refill appropriate. Please schedule for visit with new PCP Dr. Pilar Plate in next month.  Dorris Singh, MD  Family Medicine Teaching Service

## 2019-04-10 DIAGNOSIS — R55 Syncope and collapse: Secondary | ICD-10-CM

## 2019-04-10 HISTORY — DX: Syncope and collapse: R55

## 2019-04-11 ENCOUNTER — Observation Stay (HOSPITAL_COMMUNITY)
Admission: EM | Admit: 2019-04-11 | Discharge: 2019-04-13 | Disposition: A | Discharge status: Home / Self Care | Payer: Self-pay | Attending: Family Medicine | Admitting: Family Medicine

## 2019-04-11 ENCOUNTER — Telehealth: Payer: Self-pay | Admitting: *Deleted

## 2019-04-11 ENCOUNTER — Emergency Department (HOSPITAL_COMMUNITY): Payer: Self-pay

## 2019-04-11 ENCOUNTER — Other Ambulatory Visit: Payer: Self-pay

## 2019-04-11 ENCOUNTER — Encounter (HOSPITAL_COMMUNITY): Payer: Self-pay | Admitting: Emergency Medicine

## 2019-04-11 DIAGNOSIS — Z886 Allergy status to analgesic agent status: Secondary | ICD-10-CM | POA: Insufficient documentation

## 2019-04-11 DIAGNOSIS — E7800 Pure hypercholesterolemia, unspecified: Secondary | ICD-10-CM | POA: Insufficient documentation

## 2019-04-11 DIAGNOSIS — E1140 Type 2 diabetes mellitus with diabetic neuropathy, unspecified: Secondary | ICD-10-CM | POA: Insufficient documentation

## 2019-04-11 DIAGNOSIS — G4733 Obstructive sleep apnea (adult) (pediatric): Secondary | ICD-10-CM | POA: Insufficient documentation

## 2019-04-11 DIAGNOSIS — M546 Pain in thoracic spine: Secondary | ICD-10-CM | POA: Insufficient documentation

## 2019-04-11 DIAGNOSIS — R072 Precordial pain: Principal | ICD-10-CM | POA: Insufficient documentation

## 2019-04-11 DIAGNOSIS — D3502 Benign neoplasm of left adrenal gland: Secondary | ICD-10-CM | POA: Insufficient documentation

## 2019-04-11 DIAGNOSIS — I110 Hypertensive heart disease with heart failure: Secondary | ICD-10-CM | POA: Insufficient documentation

## 2019-04-11 DIAGNOSIS — K828 Other specified diseases of gallbladder: Secondary | ICD-10-CM | POA: Insufficient documentation

## 2019-04-11 DIAGNOSIS — Z7984 Long term (current) use of oral hypoglycemic drugs: Secondary | ICD-10-CM | POA: Insufficient documentation

## 2019-04-11 DIAGNOSIS — Z79899 Other long term (current) drug therapy: Secondary | ICD-10-CM | POA: Insufficient documentation

## 2019-04-11 DIAGNOSIS — I2510 Atherosclerotic heart disease of native coronary artery without angina pectoris: Secondary | ICD-10-CM | POA: Insufficient documentation

## 2019-04-11 DIAGNOSIS — Z888 Allergy status to other drugs, medicaments and biological substances status: Secondary | ICD-10-CM | POA: Insufficient documentation

## 2019-04-11 DIAGNOSIS — R1110 Vomiting, unspecified: Secondary | ICD-10-CM

## 2019-04-11 DIAGNOSIS — E785 Hyperlipidemia, unspecified: Secondary | ICD-10-CM | POA: Insufficient documentation

## 2019-04-11 DIAGNOSIS — M545 Low back pain: Secondary | ICD-10-CM | POA: Insufficient documentation

## 2019-04-11 DIAGNOSIS — M79606 Pain in leg, unspecified: Secondary | ICD-10-CM | POA: Insufficient documentation

## 2019-04-11 DIAGNOSIS — Z8249 Family history of ischemic heart disease and other diseases of the circulatory system: Secondary | ICD-10-CM | POA: Insufficient documentation

## 2019-04-11 DIAGNOSIS — Z7902 Long term (current) use of antithrombotics/antiplatelets: Secondary | ICD-10-CM | POA: Insufficient documentation

## 2019-04-11 DIAGNOSIS — I5022 Chronic systolic (congestive) heart failure: Secondary | ICD-10-CM | POA: Insufficient documentation

## 2019-04-11 DIAGNOSIS — R55 Syncope and collapse: Secondary | ICD-10-CM

## 2019-04-11 DIAGNOSIS — R079 Chest pain, unspecified: Secondary | ICD-10-CM | POA: Diagnosis present

## 2019-04-11 DIAGNOSIS — I252 Old myocardial infarction: Secondary | ICD-10-CM | POA: Insufficient documentation

## 2019-04-11 DIAGNOSIS — G2581 Restless legs syndrome: Secondary | ICD-10-CM | POA: Insufficient documentation

## 2019-04-11 DIAGNOSIS — K2270 Barrett's esophagus without dysplasia: Secondary | ICD-10-CM | POA: Insufficient documentation

## 2019-04-11 DIAGNOSIS — K219 Gastro-esophageal reflux disease without esophagitis: Secondary | ICD-10-CM | POA: Insufficient documentation

## 2019-04-11 DIAGNOSIS — E2609 Other primary hyperaldosteronism: Secondary | ICD-10-CM | POA: Insufficient documentation

## 2019-04-11 DIAGNOSIS — K8020 Calculus of gallbladder without cholecystitis without obstruction: Secondary | ICD-10-CM | POA: Insufficient documentation

## 2019-04-11 DIAGNOSIS — Z955 Presence of coronary angioplasty implant and graft: Secondary | ICD-10-CM | POA: Insufficient documentation

## 2019-04-11 DIAGNOSIS — M1990 Unspecified osteoarthritis, unspecified site: Secondary | ICD-10-CM | POA: Insufficient documentation

## 2019-04-11 DIAGNOSIS — F4310 Post-traumatic stress disorder, unspecified: Secondary | ICD-10-CM | POA: Insufficient documentation

## 2019-04-11 DIAGNOSIS — F329 Major depressive disorder, single episode, unspecified: Secondary | ICD-10-CM | POA: Insufficient documentation

## 2019-04-11 DIAGNOSIS — Z96651 Presence of right artificial knee joint: Secondary | ICD-10-CM | POA: Insufficient documentation

## 2019-04-11 DIAGNOSIS — G8929 Other chronic pain: Secondary | ICD-10-CM | POA: Insufficient documentation

## 2019-04-11 DIAGNOSIS — K449 Diaphragmatic hernia without obstruction or gangrene: Secondary | ICD-10-CM | POA: Insufficient documentation

## 2019-04-11 DIAGNOSIS — N529 Male erectile dysfunction, unspecified: Secondary | ICD-10-CM | POA: Insufficient documentation

## 2019-04-11 DIAGNOSIS — Z7982 Long term (current) use of aspirin: Secondary | ICD-10-CM | POA: Insufficient documentation

## 2019-04-11 DIAGNOSIS — R0789 Other chest pain: Secondary | ICD-10-CM

## 2019-04-11 DIAGNOSIS — G4730 Sleep apnea, unspecified: Secondary | ICD-10-CM | POA: Insufficient documentation

## 2019-04-11 DIAGNOSIS — Z20828 Contact with and (suspected) exposure to other viral communicable diseases: Secondary | ICD-10-CM | POA: Insufficient documentation

## 2019-04-11 DIAGNOSIS — D3501 Benign neoplasm of right adrenal gland: Secondary | ICD-10-CM | POA: Insufficient documentation

## 2019-04-11 DIAGNOSIS — N400 Enlarged prostate without lower urinary tract symptoms: Secondary | ICD-10-CM | POA: Insufficient documentation

## 2019-04-11 DIAGNOSIS — Z833 Family history of diabetes mellitus: Secondary | ICD-10-CM | POA: Insufficient documentation

## 2019-04-11 LAB — CBC
HCT: 36.4 % — ABNORMAL LOW (ref 39.0–52.0)
Hemoglobin: 12.5 g/dL — ABNORMAL LOW (ref 13.0–17.0)
MCH: 31.1 pg (ref 26.0–34.0)
MCHC: 34.3 g/dL (ref 30.0–36.0)
MCV: 90.5 fL (ref 80.0–100.0)
Platelets: 211 10*3/uL (ref 150–400)
RBC: 4.02 MIL/uL — ABNORMAL LOW (ref 4.22–5.81)
RDW: 13 % (ref 11.5–15.5)
WBC: 10.5 10*3/uL (ref 4.0–10.5)
nRBC: 0 % (ref 0.0–0.2)

## 2019-04-11 LAB — BASIC METABOLIC PANEL
Anion gap: 10 (ref 5–15)
BUN: 16 mg/dL (ref 6–20)
CO2: 24 mmol/L (ref 22–32)
Calcium: 8.9 mg/dL (ref 8.9–10.3)
Chloride: 104 mmol/L (ref 98–111)
Creatinine, Ser: 1.15 mg/dL (ref 0.61–1.24)
GFR calc Af Amer: >60 mL/min (ref >60)
GFR calc non Af Amer: >60 mL/min (ref >60)
Glucose, Bld: 145 mg/dL — ABNORMAL HIGH (ref 70–99)
Potassium: 4 mmol/L (ref 3.5–5.1)
Sodium: 138 mmol/L (ref 135–145)

## 2019-04-11 LAB — TROPONIN I (HIGH SENSITIVITY)
Troponin I (High Sensitivity): 6 ng/L (ref <18)
Troponin I (High Sensitivity): 6 ng/L (ref <18)

## 2019-04-11 MED ORDER — SODIUM CHLORIDE 0.9% FLUSH
3.0000 mL | Freq: Once | INTRAVENOUS | Status: AC
  Administered 2019-04-12: 3 mL via INTRAVENOUS

## 2019-04-11 NOTE — Telephone Encounter (Signed)
Pt calls because yesterday he had some chest pain, dizziness, near syncope, fatigue and cough.  He thinks this was because of the steam at his work [employer]).  He also went home and slept for 12+ hours and is still tired to day.   Advised to go to Ed since he was still fatigued and lightheaded.  Pt reluctant but agreeable.  Christen Bame, CMA

## 2019-04-11 NOTE — Telephone Encounter (Signed)
I agree with the above noted advice.  Matilde Haymaker, MD

## 2019-04-11 NOTE — ED Triage Notes (Signed)
Pt reports that yesterday he had an episode of near syncope with left sided chest pain, diaphoresis, and nausea. Reports CP ongoing today and that he has been fatigued ever since. HX of MI and stent placement

## 2019-04-12 ENCOUNTER — Observation Stay (HOSPITAL_COMMUNITY): Payer: Self-pay

## 2019-04-12 ENCOUNTER — Encounter (HOSPITAL_COMMUNITY): Payer: Self-pay | Admitting: General Practice

## 2019-04-12 DIAGNOSIS — R0789 Other chest pain: Secondary | ICD-10-CM

## 2019-04-12 DIAGNOSIS — D3501 Benign neoplasm of right adrenal gland: Secondary | ICD-10-CM | POA: Insufficient documentation

## 2019-04-12 DIAGNOSIS — K219 Gastro-esophageal reflux disease without esophagitis: Secondary | ICD-10-CM

## 2019-04-12 DIAGNOSIS — I10 Essential (primary) hypertension: Secondary | ICD-10-CM

## 2019-04-12 DIAGNOSIS — I25110 Atherosclerotic heart disease of native coronary artery with unstable angina pectoris: Secondary | ICD-10-CM

## 2019-04-12 DIAGNOSIS — R072 Precordial pain: Secondary | ICD-10-CM

## 2019-04-12 DIAGNOSIS — R112 Nausea with vomiting, unspecified: Secondary | ICD-10-CM

## 2019-04-12 LAB — LIPID PANEL
Cholesterol: 251 mg/dL — ABNORMAL HIGH (ref 0–200)
HDL: 44 mg/dL (ref >40)
LDL Cholesterol: 169 mg/dL — ABNORMAL HIGH (ref 0–99)
Total CHOL/HDL Ratio: 5.7 RATIO
Triglycerides: 190 mg/dL — ABNORMAL HIGH (ref <150)
VLDL: 38 mg/dL (ref 0–40)

## 2019-04-12 LAB — RAPID URINE DRUG SCREEN, HOSP PERFORMED
Amphetamines: NOT DETECTED
Barbiturates: NOT DETECTED
Benzodiazepines: NOT DETECTED
Cocaine: NOT DETECTED
Opiates: NOT DETECTED
Tetrahydrocannabinol: POSITIVE — AB

## 2019-04-12 LAB — HEPATIC FUNCTION PANEL
ALT: 18 U/L (ref 0–44)
AST: 18 U/L (ref 15–41)
Albumin: 3.9 g/dL (ref 3.5–5.0)
Alkaline Phosphatase: 48 U/L (ref 38–126)
Bilirubin, Direct: <0.1 mg/dL (ref 0.0–0.2)
Total Bilirubin: 0.9 mg/dL (ref 0.3–1.2)
Total Protein: 6.6 g/dL (ref 6.5–8.1)

## 2019-04-12 LAB — HEMOGLOBIN A1C
Hgb A1c MFr Bld: 7.1 % — ABNORMAL HIGH (ref 4.8–5.6)
Mean Plasma Glucose: 157.07 mg/dL

## 2019-04-12 LAB — AMYLASE: Amylase: 53 U/L (ref 28–100)

## 2019-04-12 LAB — CORTISOL: Cortisol, Plasma: 10.2 ug/dL

## 2019-04-12 LAB — SARS CORONAVIRUS 2 BY RT PCR (HOSPITAL ORDER, PERFORMED IN ~~LOC~~ HOSPITAL LAB): SARS Coronavirus 2: NEGATIVE

## 2019-04-12 LAB — GLUCOSE, CAPILLARY
Glucose-Capillary: 142 mg/dL — ABNORMAL HIGH (ref 70–99)
Glucose-Capillary: 157 mg/dL — ABNORMAL HIGH (ref 70–99)

## 2019-04-12 MED ORDER — HYDROCODONE-ACETAMINOPHEN 5-325 MG PO TABS
1.0000 | ORAL_TABLET | Freq: Three times a day (TID) | ORAL | Status: DC | PRN
  Administered 2019-04-12 (×2): 2 via ORAL
  Administered 2019-04-13: 1 via ORAL
  Filled 2019-04-12: qty 2
  Filled 2019-04-12: qty 1
  Filled 2019-04-12: qty 2

## 2019-04-12 MED ORDER — INSULIN ASPART 100 UNIT/ML ~~LOC~~ SOLN
0.0000 [IU] | Freq: Three times a day (TID) | SUBCUTANEOUS | Status: DC
  Administered 2019-04-13: 2 [IU] via SUBCUTANEOUS

## 2019-04-12 MED ORDER — CLOPIDOGREL BISULFATE 75 MG PO TABS
75.0000 mg | ORAL_TABLET | Freq: Every day | ORAL | Status: DC
  Administered 2019-04-12 – 2019-04-13 (×2): 75 mg via ORAL
  Filled 2019-04-12 (×2): qty 1

## 2019-04-12 MED ORDER — POLYETHYLENE GLYCOL 3350 17 G PO PACK: 17.0000 g | PACK | Freq: Every day | ORAL | Status: DC | PRN

## 2019-04-12 MED ORDER — ONDANSETRON HCL 4 MG/2ML IJ SOLN: 4.0000 mg | Freq: Four times a day (QID) | INTRAMUSCULAR | Status: DC | PRN

## 2019-04-12 MED ORDER — PANTOPRAZOLE SODIUM 40 MG PO TBEC
40.0000 mg | DELAYED_RELEASE_TABLET | Freq: Every day | ORAL | Status: DC
  Administered 2019-04-12 – 2019-04-13 (×2): 40 mg via ORAL
  Filled 2019-04-12 (×2): qty 1

## 2019-04-12 MED ORDER — NITROGLYCERIN 0.4 MG SL SUBL: 0.4000 mg | SUBLINGUAL_TABLET | SUBLINGUAL | Status: DC | PRN

## 2019-04-12 MED ORDER — ATORVASTATIN CALCIUM 80 MG PO TABS
80.0000 mg | ORAL_TABLET | Freq: Every day | ORAL | Status: DC
  Administered 2019-04-12 – 2019-04-13 (×2): 80 mg via ORAL
  Filled 2019-04-12 (×2): qty 1

## 2019-04-12 MED ORDER — ONDANSETRON HCL 4 MG PO TABS: 4.0000 mg | ORAL_TABLET | Freq: Four times a day (QID) | ORAL | Status: DC | PRN

## 2019-04-12 MED ORDER — NITROGLYCERIN 0.4 MG SL SUBL
0.4000 mg | SUBLINGUAL_TABLET | SUBLINGUAL | Status: DC | PRN
  Administered 2019-04-12 (×2): 0.4 mg via SUBLINGUAL
  Filled 2019-04-12: qty 1

## 2019-04-12 MED ORDER — ASPIRIN EC 81 MG PO TBEC
81.0000 mg | DELAYED_RELEASE_TABLET | Freq: Every day | ORAL | Status: DC
  Administered 2019-04-12 – 2019-04-13 (×2): 81 mg via ORAL
  Filled 2019-04-12 (×2): qty 1

## 2019-04-12 MED ORDER — CARVEDILOL 3.125 MG PO TABS
3.1250 mg | ORAL_TABLET | Freq: Two times a day (BID) | ORAL | Status: DC
  Administered 2019-04-12 – 2019-04-13 (×2): 3.125 mg via ORAL
  Filled 2019-04-12 (×2): qty 1

## 2019-04-12 MED ORDER — ENOXAPARIN SODIUM 40 MG/0.4ML ~~LOC~~ SOLN
40.0000 mg | SUBCUTANEOUS | Status: DC
  Administered 2019-04-12 – 2019-04-13 (×2): 40 mg via SUBCUTANEOUS
  Filled 2019-04-12 (×2): qty 0.4

## 2019-04-12 NOTE — Progress Notes (Addendum)
Pt complains having chest pain. 0.4mg  SL Nitroglycerin was given. Continue to monitor pt. Assess pt after 10 minutes, chest pain is not relief. Gave another 0.4 mg of Nitroglycerin.

## 2019-04-12 NOTE — H&P (Addendum)
[facility] Admission History and Physical Service Pager: [phone]  Patient name: Andrew Huerta Medical record number: [id number] Date of birth: 1965/09/27 Age: 53 y.o. Gender: male  Primary Care Provider: Matilde Haymaker, MD Consultants: Cardio Code Status: Full   Chief Complaint: chest pain   Assessment and Plan: Andrew Huerta is a 53 y.o. male presenting with recurrent chest pain. PMH is significant for chronic chest pain, CAD, s/p stent x3, HFrEF, DM, HLD, HTN, diabetic neuropathy, GERD with hiatus hernia, primary hyperaldosternosm, depression, allergies and chronic leg pain.  ACS rule out Andrew Huerta recently underwent cardiac catheterization with drug-eluting stent placement in his LAD on 01/04/2019.  Presented to the ED with left-sided stabbing chest pain, diaphoresis and vomiting similar to a previous episode that led to his most recent catheterization. EKG shows inferior lead Q waves, unchanged from previous EKG in May 2020. High sensivity troponins in the ED were 6 and 6. Heart score 4. Likely unstable angina he has recurrent chest pain at rest, increased in frequency and severity but with normal troponins and EKG which is unchanged. Patient has history of GERD which will likely be contributing to his chest pain. Has also had significant stressors this year, his wife and mother passed away, he works 2 jobs and looks after 4 children at home. He reports feeling very depressed and stressed which would exacerbate cardiac chest pain. Pneumonia considered however patient denies cough, WBC 1.5 and patient afebrile. Wells score 0 and PE unlikely diagnosis. Pleurisy considered however pain is not pleuritic, no recent respiratory infection and ACS more likely.  Given his significant cardiac history he has been admitted for observation under benefit from being assessed by cardiology in the a.m. -Admit to med tele-Dr. Nori Riis -Trend troponins -Telemetry overnight -EKG in  a.m. -Consults cards in the am, did not consult overnight as unlikely to change immediate management -Pain management with nitroglycerin and Norco -Ondansetron for nausea  CAD s/p multiple stents Cardiac catheterization on 12/2018 shows 99% stenosis of RCA, 100% stenosis of distal RCA, and other significant lesions.  He is now status post stenting at least 3 times.  He appears to suffer from stable angina which makes it difficult for him to realize when he should be seen in the ED for worsened chest pain.  His current medications at home include aspirin, Plavix, atorvastatin and nitroglycerin as needed.  He does not use his nitroglycerin often because he worries that it will cause him to become hypotensive. -Continue aspirin 81 mg, Plavix 75 mg -Continue to monitor for pain -Nitroglycerin 0.4 mg as needed  HFrEF Echo: EF 35-40% 01/04/2019.  No evidence of volume overload on exam today.  Home medication includes carvedilol 3.125mg  once daily. -Continue carvedilol  DM CBG 75 today, last A1c 7.3 (12/2018) -Hold metformin -Sensitive sliding scale -Monitor CBGs  GERD While this may be contributing to his current chest pain, he reports that his usual reflux pain feels much different in the stabbing chest pain he has been experiencing of late.  Takes Protonix 40mg  at home. -Continue Protonix  Depression Denies suicidal ideation currently admits to feeling "very very depressed".  Discussed management options with this, pt does not want medications or therapy currently.  -continue to monitor mood -f/u w/ PCP  Allergies Takes Claritin -Continue  Chronic pain of the lower extremities Takes Norco 5 -Continue Norco  FEN/GI: Heart healthy, protonix Prophylaxis: Lovenox   Disposition: home 2-3 days pending further cardiology work up   History of  Present Illness:  Andrew Huerta is a 53 y.o. male presenting with recurrent chest pain.  Was at work 2 days ago, he was washing the dishes around  6 PM.  He had a sudden onset, constant, sharp, stabbing 8/10 chest pain radiating to the left shoulder. No radiation to the neck, jaw or teeth. Patient then had several episodes of vomiting and felt very dizzy to the point where he almost passed out.  Denies LOC. Following this episode, he fell soundly asleep.  Waking, he continued to feel exhausted and remained in bed most of the day.  He called the [facility] later in the day in 8/10, he was advised to be evaluated in the ED.  Since then he has continued to experience chest pain on and off. The last time he had chest pain was earlier today in the ED when his picture was being taken. It usually last about 5 minutes and it "feels like someone is punching or stabbing me in the chest."  Pt does have Norco and nitro at home. He did not take either medication for the pain. Unsure if anything made the exacerbated or alleviated pain. He decided not to come to the ED two days ago because "I have chronic chest pain and I just though it was another day."   Patient reports has felt very stressed and depressed lately. Reports wife and mother passed away earlier this year. It has been very difficult year.  He works 2 jobs and looks after 4 children at home.  Review Of Systems: Per HPI with the following additions:   Review of Systems  Constitutional: Positive for malaise/fatigue. Negative for chills and fever.  HENT: Negative for congestion and hearing loss.   Eyes: Negative for blurred vision.  Respiratory: Positive for shortness of breath. Negative for cough.   Cardiovascular: Positive for chest pain and palpitations. Negative for leg swelling.  Gastrointestinal: Positive for nausea and vomiting. Negative for blood in stool and constipation.  Genitourinary: Negative for dysuria.  Musculoskeletal: Negative for myalgias.  Neurological: Negative for dizziness, tremors and headaches.  Psychiatric/Behavioral: Positive for depression. Negative for suicidal ideas.     Patient Active Problem List   Diagnosis Date Noted  . Rash 02/17/2019  . Erectile dysfunction due to diseases classified elsewhere 02/08/2019  . Dizziness 01/10/2019  . Acute systolic heart failure ([place]) 01/07/2019  . Chest pain on exertion 01/03/2019  . Noncompliance w/medication treatment due to intermit use of medication 01/03/2019  . Diabetic neuropathy ([place]) 06/23/2017  . Skin abnormality 05/27/2017  . Seborrheic keratoses 05/20/2017  . Nevus, non-neoplastic 05/20/2017  . Renal cyst, right 11/12/2015  . Controlled type 2 diabetes mellitus without complication, without long-term current use of insulin ([place]) 08/29/2015  . Chronic back pain 04/27/2015  . Chest pain with moderate risk of acute coronary syndrome   . New onset seizure ([place]) 09/29/2014  . Neck pain 09/25/2014  . Encounter for chronic pain management 08/09/2014  . CAD   . Hyperlipidemia   . Nephrolithiasis 10/12/2013  . Primary hyperaldosteronism ([place]) 09/13/2013  . Major depression ([place]) 07/26/2013  . Restless legs syndrome 01/05/2013  . PTSD (post-traumatic stress disorder) 05/07/2012  . Back pain, thoracic 04/14/2012  . Gastroesophageal reflux disease with hiatal hernia 02/05/2012  . Hypercholesterolemia 02/05/2012  . Herniation of nucleus pulposus 02/05/2012  . Apnea, sleep 02/05/2012  . Headache(784.0) 11/14/2011  . Chest pain 08/14/2011  . Enlarged prostate 03/26/2011  . Osteoarthrosis involving lower leg 01/08/2010  . Essential hypertension 05/02/2008  .  Barrett's esophagus 10/29/2006    Past Medical History: Past Medical History:  Diagnosis Date  . Adrenal tumor    a. s/p adrenal gland resection at [facility]. left side removal per patient  . Anxiety   . Barrett's esophagus    EGD - 11/27/09 - short segment of Barrett's  . CAD (coronary artery disease)    a. 04/27/14 [place] s/p DES to mLAD and DES to mLCx  . Chronic back pain    upper and lower per patient  . Chronic chest pain   . Degenerative  joint disease    Bilateral knees. Significant knee pain since playing football in high school. also in back per patient  . Depression   . Diabetes mellitus type 2, controlled ([place]) 08/29/2015   Diagnosed by A1c 7.6% during 08/26/15 hospital admission for chest pain  . Gastroesophageal reflux disease with hiatal hernia   . GERD (gastroesophageal reflux disease)   . Hiatal hernia    EGD - 11/27/2009  . Hyperlipidemia   . Hypertension   . Low back pain   . Primary hyperaldosteronism ([place]) 01/22/2012   S/P adrenalectomy     . PTSD (post-traumatic stress disorder)   . Sleep apnea   . Stone, kidney     Past Surgical History: Past Surgical History:  Procedure Laterality Date  . ADRENALECTOMY  10/2013  . CARDIAC CATHETERIZATION  05/01/2014   Patent stents, other disease unchanged  . CARDIAC CATHETERIZATION  04/27/2014   Procedure: CORONARY STENT INTERVENTION;  Surgeon: Carianne Taira M Martinique, MD;  Location: [facility] CATH LAB;  Service: Cardiovascular;;  DES mid Cx  DES mid LAD  . CARDIAC CATHETERIZATION N/A 03/22/2015   Procedure: Left Heart Cath and Coronary Angiography;  Surgeon: Sherren Mocha, MD;  Location: [place] CV LAB;  Service: Cardiovascular;  Laterality: N/A;  . CORONARY ANGIOPLASTY WITH STENT PLACEMENT  04/27/2014   3.0 x 16 mm Promus DES to the mid LAD and 3.5 x 28 mm Promus to the mid LCx, otherwise 20-30 percent lesions, EF 55%  . CORONARY STENT INTERVENTION N/A 01/04/2019   Procedure: CORONARY STENT INTERVENTION;  Surgeon: Troy Sine, MD;  Location: [place] CV LAB;  Service: Cardiovascular;  Laterality: N/A;  . KIDNEY STONE SURGERY  X 1  . KNEE ARTHROPLASTY Right 1984  . KNEE ARTHROSCOPY Right X 6  . LEFT HEART CATH AND CORONARY ANGIOGRAPHY N/A 01/04/2019   Procedure: LEFT HEART CATH AND CORONARY ANGIOGRAPHY;  Surgeon: Troy Sine, MD;  Location: [place] CV LAB;  Service: Cardiovascular;  Laterality: N/A;  . LEFT HEART CATHETERIZATION WITH CORONARY ANGIOGRAM N/A 04/27/2014    Procedure: LEFT HEART CATHETERIZATION WITH CORONARY ANGIOGRAM;  Surgeon: Toyoko Silos M Martinique, MD;  Location: [facility] CATH LAB;  Service: Cardiovascular;  Laterality: N/A;  . LEFT HEART CATHETERIZATION WITH CORONARY ANGIOGRAM N/A 05/01/2014   Procedure: LEFT HEART CATHETERIZATION WITH CORONARY ANGIOGRAM;  Surgeon: Burnell Blanks, MD;  Location: [facility] CATH LAB;  Service: Cardiovascular;  Laterality: N/A;  . LITHOTRIPSY  X 2    Social History: Social History   Tobacco Use  . Smoking status: Never Smoker  . Smokeless tobacco: Never Used  Substance Use Topics  . Alcohol use: Yes    Alcohol/week: 0.0 standard drinks    Comment: "seldom" per patient  . Drug use: Yes    Types: Marijuana   Additional social history:  Works in [employer] laboratory technician Denies smoking, ETOH  Lives with 4 children   Please also refer to relevant sections of EMR.  Family History: Family History  Problem Relation Age of Onset  . Cancer Father        stomach cancer  . Diabetes Father   . Heart attack Paternal Grandfather   . Diabetes Paternal Grandmother    (If not completed, MUST add something in)  Allergies and Medications: Allergies  Allergen Reactions  . Cortisone Acetate Swelling    Swelling at injection site  . Darvocet [Propoxyphene N-Acetaminophen] Nausea And Vomiting  . Nsaids Nausea And Vomiting and Other (See Comments)    Caused stomach ulcers in the past   . Xanax [Alprazolam] Other (See Comments)    Pt states causes him to be mean, irritable  . Tape Itching and Rash   No current facility-administered medications on file prior to encounter.    Current Outpatient Medications on File Prior to Encounter  Medication Sig Dispense Refill  . aspirin EC 81 MG tablet Take 81 mg by mouth daily.    Marland Kitchen atorvastatin (LIPITOR) 80 MG tablet Take 1 tablet (80 mg total) by mouth daily. 30 tablet 2  . carvedilol (COREG) 3.125 MG tablet Take 1 tablet (3.125 mg total) by mouth 2 (two) times daily with a meal. 180  tablet 3  . clopidogrel (PLAVIX) 75 MG tablet Take 1 tablet (75 mg total) by mouth daily. 30 tablet 11  . HYDROcodone-acetaminophen (NORCO/VICODIN) 5-325 MG tablet Take 1-2 tablets by mouth every 8 (eight) hours as needed for moderate pain. 100 tablet 0  . metFORMIN (GLUCOPHAGE) 500 MG tablet Take 1 tablet (500 mg total) by mouth 2 (two) times daily with a meal. (Patient taking differently: Take 500 mg by mouth daily with breakfast. ) 90 tablet 3  . nitroGLYCERIN (NITROSTAT) 0.4 MG SL tablet Place 1 tablet (0.4 mg total) under the tongue every 5 (five) minutes as needed for chest pain. 100 tablet 3  . pantoprazole (PROTONIX) 40 MG tablet Take 1 tablet (40 mg total) by mouth daily. 30 tablet 3  . sildenafil (VIAGRA) 25 MG tablet Take 1 tablet (25 mg total) by mouth daily as needed for erectile dysfunction. 10 tablet 0  . loratadine (CLARITIN REDITABS) 10 MG dissolvable tablet Take 1 tablet (10 mg total) by mouth daily. As needed for allergy symptoms (Patient not taking: Reported on 04/12/2019) 30 tablet 0  . ranolazine (RANEXA) 500 MG 12 hr tablet Take 1 tablet (500 mg total) by mouth 2 (two) times daily. (Patient not taking: Reported on 02/08/2019) 60 tablet 2    Objective: BP 128/87   Pulse 66   Temp 98.4 F (36.9 C) (Oral)   Resp 14   SpO2 97%  Exam: General: looks well, alert, cooperative, no acute distress Eyes: normal EOM, normal conjunctiva, normal sclera, no icterus  ENTM: dry mucous membranes, no pharyngeal or tonsillar erythema or exudates  Neck: supple, non tender, no thyromegaly, thyroid cysts or lymphadenopathy Cardiovascular: s1,s2 present, no s3 or s4. Cap refill < 2 secs. Experienced 1 min of left sided chest pain Respiratory: chest clear, no crackles or wheeze. No acute resp distress Gastrointestinal: abdo soft, tender LIF. No guarding. Bowel sounds present  MSK: moving all 4 limbs Derm: no rashes, ecchymosis  Neuro: cranial nerves I-XII intact, PERLLA Psych: low mood, low  affect  Labs and Imaging: CBC BMET  Recent Labs  Lab 04/11/19 1947  WBC 10.5  HGB 12.5*  HCT 36.4*  PLT 211   Recent Labs  Lab 04/11/19 1947  NA 138  K 4.0  CL 104  CO2 24  BUN 16  CREATININE 1.15  GLUCOSE 145*  CALCIUM 8.9     EKG:Q waves noted in inferior leads also seen on prior.  T wave flattening and inversions noted in inferior leads also seen in prior.  Normal sinus rhythm.  No evidence of new ischemic changes.  Dg Chest 2 View  Result Date: 04/11/2019 CLINICAL DATA:  Chest pain EXAM: CHEST - 2 VIEW COMPARISON:  Jan 03, 2019 FINDINGS: The heart size and mediastinal contours are within normal limits. Both lungs are clear. The visualized skeletal structures are unremarkable. IMPRESSION: No active cardiopulmonary disease. Electronically Signed   By: Constance Holster M.D.   On: 04/11/2019 20:06     Lattie Haw, MD 04/12/2019 PGY-1, [place] Intern pager: [phone], text pages welcome  FPTS Upper-Level Resident Addendum   I have independently interviewed and examined the patient. I have discussed the above with the original author and agree with their documentation. My edits for correction/addition/clarification are in blue. Please see also any attending notes.    Andrew Haymaker MD PGY-2, [place] [facility] 04/12/2019 6:26 AM  FPTS Service pager: [phone] (text pages welcome through [facility])

## 2019-04-12 NOTE — ED Provider Notes (Signed)
[facility] EMERGENCY DEPARTMENT Provider Note   CSN: [id number] Arrival date & time: 04/11/19  1913     History   Chief Complaint Chief Complaint  Patient presents with  . Near Syncope  . Chest Pain    HPI Andrew Huerta is a 53 y.o. male.     Patient with history of CAD, T2DM, HTN, HLD, GERD, presents for evaluation of syncopal episode that occurred 2 days ago while at work. He reports he works at a [employer], [employer]. He remembers becoming hot and sweaty, having left sided chest pain, then passing out. Since that time he has been excessively fatigued with long periods of sleep. He reports chronic daily episodes of left chest pain and notes that the pain experienced with the syncopal episode was more intense. He had similar symptoms with his most recent MI 3 months ago where a catheterization was performed and stents were placed. He was encouraged to come to the emergency department tonight after calling his PCP.   The history is provided by the patient. No language interpreter was used.  Near Syncope Associated symptoms include chest pain. Pertinent negatives include no shortness of breath.  Chest Pain Associated symptoms: diaphoresis and near-syncope   Associated symptoms: no fever, no shortness of breath and no vomiting     Past Medical History:  Diagnosis Date  . Adrenal tumor    a. s/p adrenal gland resection at [facility]. left side removal per patient  . Anxiety   . Barrett's esophagus    EGD - 11/27/09 - short segment of Barrett's  . CAD (coronary artery disease)    a. 04/27/14 [place] s/p DES to mLAD and DES to mLCx  . Chronic back pain    upper and lower per patient  . Chronic chest pain   . Degenerative joint disease    Bilateral knees. Significant knee pain since playing football in high school. also in back per patient  . Depression   . Diabetes mellitus type 2, controlled ([place]) 08/29/2015   Diagnosed by A1c 7.6% during 08/26/15 hospital admission  for chest pain  . Gastroesophageal reflux disease with hiatal hernia   . GERD (gastroesophageal reflux disease)   . Hiatal hernia    EGD - 11/27/2009  . Hyperlipidemia   . Hypertension   . Low back pain   . Primary hyperaldosteronism ([place]) 01/22/2012   S/P adrenalectomy     . PTSD (post-traumatic stress disorder)   . Sleep apnea   . Stone, kidney     Patient Active Problem List   Diagnosis Date Noted  . Rash 02/17/2019  . Erectile dysfunction due to diseases classified elsewhere 02/08/2019  . Dizziness 01/10/2019  . Acute systolic heart failure ([place]) 01/07/2019  . Chest pain on exertion 01/03/2019  . Noncompliance w/medication treatment due to intermit use of medication 01/03/2019  . Diabetic neuropathy ([place]) 06/23/2017  . Skin abnormality 05/27/2017  . Seborrheic keratoses 05/20/2017  . Nevus, non-neoplastic 05/20/2017  . Renal cyst, right 11/12/2015  . Controlled type 2 diabetes mellitus without complication, without long-term current use of insulin ([place]) 08/29/2015  . Chronic back pain 04/27/2015  . Chest pain with moderate risk of acute coronary syndrome   . New onset seizure ([place]) 09/29/2014  . Neck pain 09/25/2014  . Encounter for chronic pain management 08/09/2014  . CAD   . Hyperlipidemia   . Nephrolithiasis 10/12/2013  . Primary hyperaldosteronism ([place]) 09/13/2013  . Major depression ([place]) 07/26/2013  . Restless legs syndrome  01/05/2013  . PTSD (post-traumatic stress disorder) 05/07/2012  . Back pain, thoracic 04/14/2012  . Gastroesophageal reflux disease with hiatal hernia 02/05/2012  . Hypercholesterolemia 02/05/2012  . Herniation of nucleus pulposus 02/05/2012  . Apnea, sleep 02/05/2012  . Headache(784.0) 11/14/2011  . Chest pain 08/14/2011  . Enlarged prostate 03/26/2011  . Osteoarthrosis involving lower leg 01/08/2010  . Essential hypertension 05/02/2008  . Barrett's esophagus 10/29/2006    Past Surgical History:  Procedure Laterality Date  .  ADRENALECTOMY  10/2013  . CARDIAC CATHETERIZATION  05/01/2014   Patent stents, other disease unchanged  . CARDIAC CATHETERIZATION  04/27/2014   Procedure: CORONARY STENT INTERVENTION;  Surgeon: Peter M Martinique, MD;  Location: [facility] CATH LAB;  Service: Cardiovascular;;  DES mid Cx  DES mid LAD  . CARDIAC CATHETERIZATION N/A 03/22/2015   Procedure: Left Heart Cath and Coronary Angiography;  Surgeon: Sherren Mocha, MD;  Location: [facility] CV LAB;  Service: Cardiovascular;  Laterality: N/A;  . CORONARY ANGIOPLASTY WITH STENT PLACEMENT  04/27/2014   3.0 x 16 mm Promus DES to the mid LAD and 3.5 x 28 mm Promus to the mid LCx, otherwise 20-30 percent lesions, EF 55%  . CORONARY STENT INTERVENTION N/A 01/04/2019   Procedure: CORONARY STENT INTERVENTION;  Surgeon: Troy Sine, MD;  Location: [facility] CV LAB;  Service: Cardiovascular;  Laterality: N/A;  . KIDNEY STONE SURGERY  X 1  . KNEE ARTHROPLASTY Right 1984  . KNEE ARTHROSCOPY Right X 6  . LEFT HEART CATH AND CORONARY ANGIOGRAPHY N/A 01/04/2019   Procedure: LEFT HEART CATH AND CORONARY ANGIOGRAPHY;  Surgeon: Troy Sine, MD;  Location: [facility] CV LAB;  Service: Cardiovascular;  Laterality: N/A;  . LEFT HEART CATHETERIZATION WITH CORONARY ANGIOGRAM N/A 04/27/2014   Procedure: LEFT HEART CATHETERIZATION WITH CORONARY ANGIOGRAM;  Surgeon: Peter M Martinique, MD;  Location: [facility] CATH LAB;  Service: Cardiovascular;  Laterality: N/A;  . LEFT HEART CATHETERIZATION WITH CORONARY ANGIOGRAM N/A 05/01/2014   Procedure: LEFT HEART CATHETERIZATION WITH CORONARY ANGIOGRAM;  Surgeon: Burnell Blanks, MD;  Location: [facility] CATH LAB;  Service: Cardiovascular;  Laterality: N/A;  . LITHOTRIPSY  X 2        Home Medications    Prior to Admission medications   Medication Sig Start Date End Date Taking? Authorizing Provider  aspirin EC 81 MG tablet Take 81 mg by mouth daily.    [provider]  atorvastatin (LIPITOR) 80 MG tablet Take 1 tablet (80 mg  total) by mouth daily. 01/05/19   Sande Rives E, PA-C  carvedilol (COREG) 3.125 MG tablet Take 1 tablet (3.125 mg total) by mouth 2 (two) times daily with a meal. 01/07/19   Diallo, Abdoulaye, MD  clopidogrel (PLAVIX) 75 MG tablet Take 1 tablet (75 mg total) by mouth daily. 01/06/19   Darreld Mclean, PA-C  HYDROcodone-acetaminophen (NORCO/VICODIN) 5-325 MG tablet Take 1-2 tablets by mouth every 8 (eight) hours as needed for moderate pain. 03/23/19   Martyn Malay, MD  loratadine (CLARITIN REDITABS) 10 MG dissolvable tablet Take 1 tablet (10 mg total) by mouth daily. As needed for allergy symptoms 02/15/19   Guadalupe Dawn, MD  metFORMIN (GLUCOPHAGE) 500 MG tablet Take 1 tablet (500 mg total) by mouth 2 (two) times daily with a meal. 01/14/19   Rumball, Bryson Ha, DO  nitroGLYCERIN (NITROSTAT) 0.4 MG SL tablet Place 1 tablet (0.4 mg total) under the tongue every 5 (five) minutes as needed for chest pain. 01/05/19 01/05/20  Darreld Mclean, PA-C  pantoprazole (PROTONIX) 40 MG tablet Take 1 tablet (40 mg total) by mouth daily. 01/19/19   Diallo, Earna Coder, MD  ranolazine (RANEXA) 500 MG 12 hr tablet Take 1 tablet (500 mg total) by mouth 2 (two) times daily. Patient not taking: Reported on 02/08/2019 01/19/19   Almyra Deforest, PA  sildenafil (VIAGRA) 25 MG tablet Take 1 tablet (25 mg total) by mouth daily as needed for erectile dysfunction. 02/08/19   Marjie Skiff, MD    Family History Family History  Problem Relation Age of Onset  . Cancer Father        stomach cancer  . Diabetes Father   . Heart attack Paternal Grandfather   . Diabetes Paternal Grandmother     Social History Social History   Tobacco Use  . Smoking status: Never Smoker  . Smokeless tobacco: Never Used  Substance Use Topics  . Alcohol use: Yes    Alcohol/week: 0.0 standard drinks    Comment: "seldom" per patient  . Drug use: Yes    Types: Marijuana     Allergies   Cortisone acetate, Darvocet [propoxyphene  n-acetaminophen], Nsaids, Xanax [alprazolam], and Tape   Review of Systems Review of Systems  Constitutional: Positive for diaphoresis. Negative for chills and fever.  HENT: Negative.   Respiratory: Negative.  Negative for shortness of breath.   Cardiovascular: Positive for chest pain and near-syncope.  Gastrointestinal: Negative.  Negative for vomiting.  Musculoskeletal: Negative.   Skin: Negative.   Neurological: Positive for syncope.     Physical Exam Updated Vital Signs BP 130/78 (BP Location: Left Arm)   Pulse 78   Temp 98.4 F (36.9 C) (Oral)   Resp 18   SpO2 97%   Physical Exam Constitutional:      Appearance: He is well-developed.  HENT:     Head: Normocephalic.  Neck:     Musculoskeletal: Normal range of motion and neck supple.  Cardiovascular:     Rate and Rhythm: Normal rate and regular rhythm.     Heart sounds: No murmur.  Pulmonary:     Effort: Pulmonary effort is normal.     Breath sounds: Normal breath sounds. No wheezing, rhonchi or rales.  Abdominal:     General: Bowel sounds are normal.     Palpations: Abdomen is soft.     Tenderness: There is no abdominal tenderness. There is no guarding or rebound.  Musculoskeletal: Normal range of motion.     Right lower leg: No edema.     Left lower leg: No edema.  Skin:    General: Skin is warm and dry.     Findings: No rash.  Neurological:     Mental Status: He is alert and oriented to person, place, and time.      ED Treatments / Results  Labs (all labs ordered are listed, but only abnormal results are displayed) Labs Reviewed  BASIC METABOLIC PANEL - Abnormal; Notable for the following components:      Result Value   Glucose, Bld 145 (*)    All other components within normal limits  CBC - Abnormal; Notable for the following components:   RBC 4.02 (*)    Hemoglobin 12.5 (*)    HCT 36.4 (*)    All other components within normal limits  TROPONIN I (HIGH SENSITIVITY)  TROPONIN I (HIGH  SENSITIVITY)   Results for orders placed or performed during the hospital encounter of 87/56/43  Basic metabolic panel  Result Value Ref Range   Sodium 138 135 -  145 mmol/L   Potassium 4.0 3.5 - 5.1 mmol/L   Chloride 104 98 - 111 mmol/L   CO2 24 22 - 32 mmol/L   Glucose, Bld 145 (H) 70 - 99 mg/dL   BUN 16 6 - 20 mg/dL   Creatinine, Ser 1.15 0.61 - 1.24 mg/dL   Calcium 8.9 8.9 - 10.3 mg/dL   GFR calc non Af Amer >60 >60 mL/min   GFR calc Af Amer >60 >60 mL/min   Anion gap 10 5 - 15  CBC  Result Value Ref Range   WBC 10.5 4.0 - 10.5 K/uL   RBC 4.02 (L) 4.22 - 5.81 MIL/uL   Hemoglobin 12.5 (L) 13.0 - 17.0 g/dL   HCT 36.4 (L) 39.0 - 52.0 %   MCV 90.5 80.0 - 100.0 fL   MCH 31.1 26.0 - 34.0 pg   MCHC 34.3 30.0 - 36.0 g/dL   RDW 13.0 11.5 - 15.5 %   Platelets 211 150 - 400 K/uL   nRBC 0.0 0.0 - 0.2 %  Troponin I (High Sensitivity)  Result Value Ref Range   Troponin I (High Sensitivity) 6 <18 ng/L  Troponin I (High Sensitivity)  Result Value Ref Range   Troponin I (High Sensitivity) 6 <18 ng/L    EKG None  Radiology Dg Chest 2 View  Result Date: 04/11/2019 CLINICAL DATA:  Chest pain EXAM: CHEST - 2 VIEW COMPARISON:  Jan 03, 2019 FINDINGS: The heart size and mediastinal contours are within normal limits. Both lungs are clear. The visualized skeletal structures are unremarkable. IMPRESSION: No active cardiopulmonary disease. Electronically Signed   By: Constance Holster M.D.   On: 04/11/2019 20:06    Procedures Procedures (including critical care time)  Medications Ordered in ED Medications  sodium chloride flush (NS) 0.9 % injection 3 mL (has no administration in time range)     Initial Impression / Assessment and Plan / ED Course  I have reviewed the triage vital signs and the nursing notes.  Pertinent labs & imaging results that were available during my care of the patient were reviewed by me and considered in my medical decision making (see chart for details).         Patient to ED after syncopal episode 2 days ago with excessive fatigue and long periods of sleep since. He reports chest pain that was worse than his chronic/recurrent chest pain at the time of passing out, and set of symptoms was similar to previous/recent MI. He is currently asymptomatic.   VSS. The labs are reassuring with HS Troponin x 2 of 6. EKG unchanged. CXR clear.   Given the patient's extensive cardiac history, EF 35%, recent MI with similar presenting symptoms, and following the Chest pain Algorithm, the patient will need to be admitted for further evaluation/observation. PCP consulted for admission.   Final Clinical Impressions(s) / ED Diagnoses   Final diagnoses:  None   1. Syncope 2. Chest pain  ED Discharge Orders    None       Charlann Lange, PA-C 04/12/19 0315    Maudie Flakes, MD 04/12/19 [phone]

## 2019-04-12 NOTE — Discharge Summary (Signed)
[facility] Discharge Summary  Patient name: Andrew Huerta Medical record number: [id number] Date of birth: Jan 13, 1966 Age: 53 y.o. Gender: male Date of Admission: 04/11/2019   Date of Discharge: 04/13/19    Admitting Physician: Dickie La, MD  Primary Care Provider: Matilde Haymaker, MD Consultants: None  Indication for Hospitalization:  Active Problems:   Chest pain   Non-intractable vomiting  Discharge Diagnoses/Problem List:  Active Problems:   Chest pain   Non-intractable vomiting  Disposition: Discharged to home  Discharge Condition: Stable  Discharge Exam:  BP 120/87 (BP Location: Right Arm)   Pulse 62   Temp (!) 97.4 F (36.3 C) (Oral)   Resp 17   Ht 5\' 7"  (1.702 m)   Wt 70.4 kg Comment: scale c   SpO2 99%   BMI 24.31 kg/m   General: Alert and cooperative and appears to be in no acute distress Cardio: RRR. No murmurs or rubs.  Pulm: Clear to auscultation bilaterally, no wheezing, no crackles, no increased work of breathing Abdomen:abdomen is soft, bowel sounds normal. Abdomen  non-tender.  Extremities: No peripheral edema. Warm/well perfused.  Strong radial and pedal pulses.   Brief Hospital Course:  Andrew Huerta is a 53 y.o. male with past medical history significant for CAD s/p 3 stents, HFrEF, GERD, HTN, DM presenting with chronic chest pain.   Patient began experiencing chest pain that the rated as 8/10 in severity that caused him to feel tired. Upon awakening the next day, he continued to have pain and called his PCP who recommended that he be evaluated in the ED. Upon arrival to the ED, patient had an EKG that showed no new ischemic changes and negative troponin levels. Patient's CP was resolved with nitroglycerin. Cardiology was consulted and commended follow-up in lipid clinic for elevated LDL cholesterol despite being on atorvastatin 80 mg.  Patient will continue atorvastatin 80, Coreg 3.125, aspirin 81mg , and Plavix 75  mg on discharge.  Patient has history of pheochromocytoma that was removed, MRI was completed during this admission to evaluate for remaining adrenal adenoma. Cortisol level was within normal limits and was found to have benign adrenal adenoma.   Issues for Follow Up:  1. Improvement of chest pain  2. UDS for chronic pain meds. Additionally his Norco dose & pain plan  3. Medication compliance and cost  4. Possibly switch to [place] for better medication coverage; or attempt to get patient orange card 5. Started Losartan 12.5mg , please monitor renal function  6. Please evaluate for MDD as patient recently lost his wife and mother 74. F/u at cardiology lipid clinic for possible PCSK 9 inhibitor and financial assistance  7.   Will need to follow-up with [facility] in 3 months for                Barrett's esophagus with vomiting.  8.  Continue Coreg low dose, DAPT and statin per cardiology; lipid clinic for follow up. LDL goal <70.  Significant Procedures:   Procedure Orders     ED EKG     EKG 12-Lead     EKG  Significant Labs and Imaging:  Recent Labs  Lab 04/11/19 1947  WBC 10.5  HGB 12.5*  HCT 36.4*  PLT 211   Recent Labs  Lab 04/11/19 1947 04/12/19 1802  NA 138  --   K 4.0  --   CL 104  --   CO2 24  --   GLUCOSE 145*  --  BUN 16  --   CREATININE 1.15  --   CALCIUM 8.9  --   ALKPHOS  --  48  AST  --  18  ALT  --  18  ALBUMIN  --  3.9    Dg Chest 2 View  Result Date: 04/11/2019 CLINICAL DATA:  Chest pain EXAM: CHEST - 2 VIEW COMPARISON:  Jan 03, 2019 FINDINGS: The heart size and mediastinal contours are within normal limits. Both lungs are clear. The visualized skeletal structures are unremarkable. IMPRESSION: No active cardiopulmonary disease. Electronically Signed   By: Constance Holster M.D.   On: 04/11/2019 20:06   Mr Abdomen Wo Contrast  Result Date: 04/12/2019 CLINICAL DATA:  Evaluate adrenal nodule. EXAM: MRI ABDOMEN WITHOUT CONTRAST  TECHNIQUE: Multiplanar multisequence MR imaging was performed without the administration of intravenous contrast. COMPARISON:  CT 05/13/2016 FINDINGS: Lower chest: No acute findings. Hepatobiliary: Mild hepatic steatosis. No focal liver abnormality identified. No biliary ductal dilatation. Collapsed gallbladder containing stones measuring up to 3 mm. Pancreas: No mass, inflammatory changes, or other parenchymal abnormality identified. Spleen:  Within normal limits in size and appearance. Adrenals/Urinary Tract: There is a 2.5 cm right adrenal gland nodule which exhibits diffuse loss of signal on the out of phase sequences compatible with a benign adrenal gland adenoma. Adjacent right adrenal gland nodule measures 2.0 cm and also loses signal from the inphase to add of phase sequences compatible with a benign adenoma. Normal left adrenal gland. Right kidney cyst measures 1.8 cm. No mass or hydronephrosis identified bilaterally. Stomach/Bowel: Visualized portions within the abdomen are unremarkable. Vascular/Lymphatic: No pathologically enlarged lymph nodes identified. No abdominal aortic aneurysm demonstrated. Other:  No free fluid or fluid collections Musculoskeletal: No suspicious bone lesions identified. IMPRESSION: 1. Right adrenal nodules have signal characteristics compatible with a benign adenoma. 2. Mild hepatic steatosis. 3. Gallstones. Electronically Signed   By: Kerby Moors M.D.   On: 04/12/2019 13:22    Results/Tests Pending at Time of Discharge:  . None  Discharge Medications:  Allergies as of 04/13/2019      Reactions   Cortisone Acetate Swelling   Swelling at injection site   Darvocet [propoxyphene N-acetaminophen] Nausea And Vomiting   Nsaids Nausea And Vomiting, Other (See Comments)   Caused stomach ulcers in the past    Xanax [alprazolam] Other (See Comments)   Pt states causes him to be mean, irritable   Tape Itching, Rash      Medication List    STOP taking these medications    loratadine 10 MG dissolvable tablet Commonly known as: CLARITIN REDITABS   ranolazine 500 MG 12 hr tablet Commonly known as: Ranexa   sildenafil 25 MG tablet Commonly known as: VIAGRA     TAKE these medications   aspirin EC 81 MG tablet Take 81 mg by mouth daily. Notes to patient: 8/13   atorvastatin 80 MG tablet Commonly known as: LIPITOR Take 1 tablet (80 mg total) by mouth daily. Notes to patient: 8/13   carvedilol 3.125 MG tablet Commonly known as: COREG Take 1 tablet (3.125 mg total) by mouth 2 (two) times daily with a meal. Notes to patient: 8/13   clopidogrel 75 MG tablet Commonly known as: PLAVIX Take 1 tablet (75 mg total) by mouth daily. Notes to patient: 8/13   HYDROcodone-acetaminophen 5-325 MG tablet Commonly known as: NORCO/VICODIN Take 1-2 tablets by mouth every 8 (eight) hours as needed for moderate pain.   losartan 25 MG tablet Commonly known as: COZAAR Take 0.5  tablets (12.5 mg total) by mouth at bedtime.   metFORMIN 500 MG tablet Commonly known as: GLUCOPHAGE Take 1 tablet (500 mg total) by mouth 2 (two) times daily with a meal. What changed: when to take this   nitroGLYCERIN 0.4 MG SL tablet Commonly known as: Nitrostat Place 1 tablet (0.4 mg total) under the tongue every 5 (five) minutes as needed for chest pain.   ondansetron 4 MG tablet Commonly known as: ZOFRAN Take 1 tablet (4 mg total) by mouth every 6 (six) hours as needed for nausea.   pantoprazole 40 MG tablet Commonly known as: PROTONIX Take 1 tablet (40 mg total) by mouth daily. Notes to patient: 8/13       Discharge Instructions: Please refer to Patient Instructions section of EMR for full details.  Patient was counseled important signs and symptoms that should prompt return to medical care, changes in medications, dietary instructions, activity restrictions, and follow up appointments.   Follow-Up Appointments: Future Appointments  Date Time Provider [place]   04/18/2019 11:00 AM [facility] [place]  04/21/2019 10:10 AM Matilde Haymaker, MD [facility]  04/29/2019  2:00 PM [facility] PH[facility]AC[facility]  05/03/2019  8:00 AM Almyra Deforest, PA [facility]     Stark Klein, MD 04/14/2019, 3:55 PM PGY-2, [place]
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
## 2019-04-12 NOTE — ED Notes (Signed)
ED Provider at bedside. 

## 2019-04-12 NOTE — ED Notes (Signed)
Breakfast ordered 

## 2019-04-12 NOTE — Progress Notes (Signed)
FPTS Interim Progress Note  S:Patient reports that he has no CP at this time, no further emesis, denies SOB  & jaw pain,does report some leg and back pain   O: BP 112/88   Pulse 91   Temp 98.4 F (36.9 C) (Oral)   Resp 17   SpO2 98%   Physical Exam Constitutional:      General: He is not in acute distress.    Appearance: He is well-developed. He is not ill-appearing or diaphoretic.  HENT:     Nose: No congestion.     Mouth/Throat:     Mouth: Mucous membranes are moist.     Pharynx: Oropharynx is clear.  Eyes:     Extraocular Movements: Extraocular movements intact.     Conjunctiva/sclera: Conjunctivae normal.     Pupils: Pupils are equal, round, and reactive to light.  Neck:     Musculoskeletal: Normal range of motion and neck supple.  Cardiovascular:     Rate and Rhythm: Normal rate and regular rhythm.     Pulses: Normal pulses.     Heart sounds: Normal heart sounds. No murmur. No friction rub.  Pulmonary:     Effort: Pulmonary effort is normal. No respiratory distress.     Breath sounds: Normal breath sounds. No rales.  Chest:     Chest wall: No tenderness.  Abdominal:     General: Bowel sounds are normal. There is no distension.     Palpations: Abdomen is soft. There is no mass.     Tenderness: There is no abdominal tenderness.  Skin:    Capillary Refill: Capillary refill takes less than 2 seconds.  Neurological:     Mental Status: He is alert and oriented to person, place, and time.    A/P:  Chronic Chest Pain exacerbation ACS Rule Out -consulted cardiology -negative troponins -EKG with no new ischemia   History of Adrenal Adenoma -adrenal MRI  -Cortisol level   Stark Klein, MD 04/12/2019, 5:49 AM PGY-1, [facility] Service pager [phone]

## 2019-04-12 NOTE — ED Notes (Signed)
ED TO INPATIENT HANDOFF REPORT  ED Nurse Name and Phone #: [phone]  S Name/Age/Gender Andrew Huerta 53 y.o. male Room/Bed: 037C/037C  Code Status   Code Status: Full Code  Home/SNF/Other Home Patient oriented to: self, place, time and situation Is this baseline? Yes   Triage Complete: Triage complete  Chief Complaint CP, Passed out, vomiting  Triage Note Pt reports that yesterday he had an episode of near syncope with left sided chest pain, diaphoresis, and nausea. Reports CP ongoing today and that he has been fatigued ever since. HX of MI and stent placement   Allergies Allergies  Allergen Reactions  . Cortisone Acetate Swelling    Swelling at injection site  . Darvocet [Propoxyphene N-Acetaminophen] Nausea And Vomiting  . Nsaids Nausea And Vomiting and Other (See Comments)    Caused stomach ulcers in the past   . Xanax [Alprazolam] Other (See Comments)    Pt states causes him to be mean, irritable  . Tape Itching and Rash    Level of Care/Admitting Diagnosis ED Disposition    ED Disposition Condition Comment   Admit  Hospital Area: [facility] [[id number]]  Level of Care: Telemetry Medical [104]  Covid Evaluation: Asymptomatic Screening Protocol (No Symptoms)  Diagnosis: Chest pain [017793]  Admitting Physician: Matilde Haymaker [[id number]]  Attending Physician: Dorcas Mcmurray L [[id number]]  PT Class (Do Not Modify): Observation [104]  PT Acc Code (Do Not Modify): Observation [10022]       B Medical/Surgery History Past Medical History:  Diagnosis Date  . Adrenal tumor    a. s/p adrenal gland resection at [facility]. left side removal per patient  . Anxiety   . Barrett's esophagus    EGD - 11/27/09 - short segment of Barrett's  . CAD (coronary artery disease)    a. 04/27/14 [place] s/p DES to mLAD and DES to mLCx  . Chronic back pain    upper and lower per patient  . Chronic chest pain   . Degenerative joint disease    Bilateral knees. Significant knee pain  since playing football in high school. also in back per patient  . Depression   . Diabetes mellitus type 2, controlled ([place]) 08/29/2015   Diagnosed by A1c 7.6% during 08/26/15 hospital admission for chest pain  . Gastroesophageal reflux disease with hiatal hernia   . GERD (gastroesophageal reflux disease)   . Hiatal hernia    EGD - 11/27/2009  . Hyperlipidemia   . Hypertension   . Low back pain   . Primary hyperaldosteronism ([place]) 01/22/2012   S/P adrenalectomy     . PTSD (post-traumatic stress disorder)   . Sleep apnea   . Stone, kidney    Past Surgical History:  Procedure Laterality Date  . ADRENALECTOMY  10/2013  . CARDIAC CATHETERIZATION  05/01/2014   Patent stents, other disease unchanged  . CARDIAC CATHETERIZATION  04/27/2014   Procedure: CORONARY STENT INTERVENTION;  Surgeon: Peter M Martinique, MD;  Location: [facility] CATH LAB;  Service: Cardiovascular;;  DES mid Cx  DES mid LAD  . CARDIAC CATHETERIZATION N/A 03/22/2015   Procedure: Left Heart Cath and Coronary Angiography;  Surgeon: Sherren Mocha, MD;  Location: [facility] CV LAB;  Service: Cardiovascular;  Laterality: N/A;  . CORONARY ANGIOPLASTY WITH STENT PLACEMENT  04/27/2014   3.0 x 16 mm Promus DES to the mid LAD and 3.5 x 28 mm Promus to the mid LCx, otherwise 20-30 percent lesions, EF 55%  . CORONARY STENT INTERVENTION N/A 01/04/2019  Procedure: CORONARY STENT INTERVENTION;  Surgeon: Troy Sine, MD;  Location: [facility] CV LAB;  Service: Cardiovascular;  Laterality: N/A;  . KIDNEY STONE SURGERY  X 1  . KNEE ARTHROPLASTY Right 1984  . KNEE ARTHROSCOPY Right X 6  . LEFT HEART CATH AND CORONARY ANGIOGRAPHY N/A 01/04/2019   Procedure: LEFT HEART CATH AND CORONARY ANGIOGRAPHY;  Surgeon: Troy Sine, MD;  Location: [facility] CV LAB;  Service: Cardiovascular;  Laterality: N/A;  . LEFT HEART CATHETERIZATION WITH CORONARY ANGIOGRAM N/A 04/27/2014   Procedure: LEFT HEART CATHETERIZATION WITH CORONARY ANGIOGRAM;  Surgeon: Peter  M Martinique, MD;  Location: [facility] CATH LAB;  Service: Cardiovascular;  Laterality: N/A;  . LEFT HEART CATHETERIZATION WITH CORONARY ANGIOGRAM N/A 05/01/2014   Procedure: LEFT HEART CATHETERIZATION WITH CORONARY ANGIOGRAM;  Surgeon: Burnell Blanks, MD;  Location: [facility] CATH LAB;  Service: Cardiovascular;  Laterality: N/A;  . LITHOTRIPSY  X 2     A IV Location/Drains/Wounds Patient Lines/Drains/Airways Status   Active Line/Drains/Airways    Name:   Placement date:   Placement time:   Site:   Days:   Peripheral IV 04/12/19 Left Forearm   04/12/19    0435    Forearm   less than 1          Intake/Output Last 24 hours No intake or output data in the 24 hours ending 04/12/19 1155  Labs/Imaging Results for orders placed or performed during the hospital encounter of 04/11/19 (from the past 48 hour(s))  Basic metabolic panel     Status: Abnormal   Collection Time: 04/11/19  7:47 PM  Result Value Ref Range   Sodium 138 135 - 145 mmol/L   Potassium 4.0 3.5 - 5.1 mmol/L   Chloride 104 98 - 111 mmol/L   CO2 24 22 - 32 mmol/L   Glucose, Bld 145 (H) 70 - 99 mg/dL   BUN 16 6 - 20 mg/dL   Creatinine, Ser 1.15 0.61 - 1.24 mg/dL   Calcium 8.9 8.9 - 10.3 mg/dL   GFR calc non Af Amer >60 >60 mL/min   GFR calc Af Amer >60 >60 mL/min   Anion gap 10 5 - 15    Comment: Performed at [facility], [place] [address], [place], [place] [address]  CBC     Status: Abnormal   Collection Time: 04/11/19  7:47 PM  Result Value Ref Range   WBC 10.5 4.0 - 10.5 K/uL   RBC 4.02 (L) 4.22 - 5.81 MIL/uL   Hemoglobin 12.5 (L) 13.0 - 17.0 g/dL   HCT 36.4 (L) 39.0 - 52.0 %   MCV 90.5 80.0 - 100.0 fL   MCH 31.1 26.0 - 34.0 pg   MCHC 34.3 30.0 - 36.0 g/dL   RDW 13.0 11.5 - 15.5 %   Platelets 211 150 - 400 K/uL   nRBC 0.0 0.0 - 0.2 %    Comment: Performed at [facility], [place] [address] [place], [place] [address]  Troponin I (High Sensitivity)     Status: None   Collection Time: 04/11/19  7:47 PM  Result  Value Ref Range   Troponin I (High Sensitivity) 6 <18 ng/L    Comment: (NOTE) Elevated high sensitivity troponin I (hsTnI) values and significant  changes across serial measurements may suggest ACS but many other  chronic and acute conditions are known to elevate hsTnI results.  Refer to the "Links" section for chest pain algorithms and additional  guidance. Performed at [facility]  [facility], [place] [address] [place], [place] [address]   Troponin I (High Sensitivity)     Status: None   Collection Time: 04/11/19 10:23 PM  Result Value Ref Range   Troponin I (High Sensitivity) 6 <18 ng/L    Comment: (NOTE) Elevated high sensitivity troponin I (hsTnI) values and significant  changes across serial measurements may suggest ACS but many other  chronic and acute conditions are known to elevate hsTnI results.  Refer to the "Links" section for chest pain algorithms and additional  guidance. Performed at [facility], [place] [address], [place], [place] [address]   SARS Coronavirus 2 [facility] order, Performed in [facility] hospital lab) Nasopharyngeal Nasopharyngeal Swab     Status: None   Collection Time: 04/12/19  3:08 AM   Specimen: Nasopharyngeal Swab  Result Value Ref Range   SARS Coronavirus 2 NEGATIVE NEGATIVE    Comment: (NOTE) If result is NEGATIVE SARS-CoV-2 target nucleic acids are NOT DETECTED. The SARS-CoV-2 RNA is generally detectable in upper and lower  respiratory specimens during the acute phase of infection. The lowest  concentration of SARS-CoV-2 viral copies this assay can detect is 250  copies / mL. A negative result does not preclude SARS-CoV-2 infection  and should not be used as the sole basis for treatment or other  patient management decisions.  A negative result may occur with  improper specimen collection / handling, submission of specimen other  than nasopharyngeal swab, presence of viral mutation(s) within the  areas targeted by this assay, and inadequate  number of viral copies  (<250 copies / mL). A negative result must be combined with clinical  observations, patient history, and epidemiological information. If result is POSITIVE SARS-CoV-2 target nucleic acids are DETECTED. The SARS-CoV-2 RNA is generally detectable in upper and lower  respiratory specimens dur ing the acute phase of infection.  Positive  results are indicative of active infection with SARS-CoV-2.  Clinical  correlation with patient history and other diagnostic information is  necessary to determine patient infection status.  Positive results do  not rule out bacterial infection or co-infection with other viruses. If result is PRESUMPTIVE POSTIVE SARS-CoV-2 nucleic acids MAY BE PRESENT.   A presumptive positive result was obtained on the submitted specimen  and confirmed on repeat testing.  While 2019 novel coronavirus  (SARS-CoV-2) nucleic acids may be present in the submitted sample  additional confirmatory testing may be necessary for epidemiological  and / or clinical management purposes  to differentiate between  SARS-CoV-2 and other Sarbecovirus currently known to infect humans.  If clinically indicated additional testing with an alternate test  methodology [id number]) is advised. The SARS-CoV-2 RNA is generally  detectable in upper and lower respiratory sp ecimens during the acute  phase of infection. The expected result is Negative. Fact Sheet for Patients:  StrictlyIdeas.no Fact Sheet for Healthcare Providers: BankingDealers.co.za This test is not yet approved or cleared by the [place] FDA and has been authorized for detection and/or diagnosis of SARS-CoV-2 by FDA under an Emergency Use Authorization (EUA).  This EUA will remain in effect (meaning this test can be used) for the duration of the COVID-19 declaration under Section 564(b)(1) of the Act, 21 U.S.C. section 360bbb-3(b)(1), unless the  authorization is terminated or revoked sooner. Performed at [facility], [place] [address] [place], [place] [address]   Hemoglobin A1c     Status: Abnormal   Collection Time: 04/12/19  4:27 AM  Result Value Ref  Range   Hgb A1c MFr Bld 7.1 (H) 4.8 - 5.6 %    Comment: (NOTE) Pre diabetes:          5.7%-6.4% Diabetes:              >6.4% Glycemic control for   <7.0% adults with diabetes    Mean Plasma Glucose 157.07 mg/dL    Comment: Performed at [place] [address], [place], [place] [address]  Lipid panel     Status: Abnormal   Collection Time: 04/12/19  4:27 AM  Result Value Ref Range   Cholesterol 251 (H) 0 - 200 mg/dL   Triglycerides 190 (H) <150 mg/dL   HDL 44 >40 mg/dL   Total CHOL/HDL Ratio 5.7 RATIO   VLDL 38 0 - 40 mg/dL   LDL Cholesterol 169 (H) 0 - 99 mg/dL    Comment:        Total Cholesterol/HDL:CHD Risk Coronary Heart Disease Risk Table                     Men   Women  1/2 Average Risk   3.4   3.3  Average Risk       5.0   4.4  2 X Average Risk   9.6   7.1  3 X Average Risk  23.4   11.0        Use the calculated Patient Ratio above and the CHD Risk Table to determine the patient's CHD Risk.        ATP III CLASSIFICATION (LDL):  <100     mg/dL   Optimal  100-129  mg/dL   Near or Above                    Optimal  130-159  mg/dL   Borderline  160-189  mg/dL   High  >190     mg/dL   Very High Performed at [facility] [address] [place], [place] [address]    Dg Chest 2 View  Result Date: 04/11/2019 CLINICAL DATA:  Chest pain EXAM: CHEST - 2 VIEW COMPARISON:  Jan 03, 2019 FINDINGS: The heart size and mediastinal contours are within normal limits. Both lungs are clear. The visualized skeletal structures are unremarkable. IMPRESSION: No active cardiopulmonary disease. Electronically Signed   By: Constance Holster M.D.   On: 04/11/2019 20:06    Pending Labs Unresulted Labs (From admission, onward)    Start     Ordered    04/12/19 1020  Urine rapid drug screen (hosp performed)  ONCE - STAT,   STAT     04/12/19 1019          Vitals/Pain Today's Vitals   04/12/19 1015 04/12/19 1030 04/12/19 1045 04/12/19 1115  BP: (!) 124/93 (!) 149/110 (!) 166/110 (!) 145/104  Pulse: 68 77 84 82  Resp: 12 15 18 16   Temp:      TempSrc:      SpO2: 96% 97% 98% 97%  PainSc:        Isolation Precautions No active isolations  Medications Medications  aspirin EC tablet 81 mg (81 mg Oral Given 04/12/19 1122)  HYDROcodone-acetaminophen (NORCO/VICODIN) 5-325 MG per tablet 1-2 tablet (has no administration in time range)  atorvastatin (LIPITOR) tablet 80 mg (80 mg Oral Given 04/12/19 1121)  carvedilol (COREG) tablet 3.125 mg (has no administration in time range)  nitroGLYCERIN (NITROSTAT) SL tablet 0.4 mg (has no administration in time range)  pantoprazole (PROTONIX) EC tablet 40 mg (has no administration in time range)  clopidogrel (PLAVIX) tablet 75 mg (75 mg Oral Given 04/12/19 1124)  enoxaparin (LOVENOX) injection 30 mg (has no administration in time range)  polyethylene glycol (MIRALAX / GLYCOLAX) packet 17 g (has no administration in time range)  ondansetron (ZOFRAN) tablet 4 mg (has no administration in time range)    Or  ondansetron (ZOFRAN) injection 4 mg (has no administration in time range)  sodium chloride flush (NS) 0.9 % injection 3 mL (3 mLs Intravenous Given 04/12/19 0435)    Mobility walks Low fall risk   Focused Assessments Cardiac Assessment Handoff:  Cardiac Rhythm: Normal sinus rhythm Lab Results  Component Value Date   CKTOTAL 52 08/15/2011   CKMB 2.0 08/15/2011   TROPONINI <0.03 01/04/2019   Lab Results  Component Value Date   DDIMER <0.27 07/24/2014   Does the Patient currently have chest pain? No     R Recommendations: See Admitting Provider Note  Report given to:   Additional Notes:

## 2019-04-13 DIAGNOSIS — E1165 Type 2 diabetes mellitus with hyperglycemia: Secondary | ICD-10-CM

## 2019-04-13 DIAGNOSIS — E785 Hyperlipidemia, unspecified: Secondary | ICD-10-CM

## 2019-04-13 DIAGNOSIS — F1199 Opioid use, unspecified with unspecified opioid-induced disorder: Secondary | ICD-10-CM

## 2019-04-13 LAB — GLUCOSE, CAPILLARY
Glucose-Capillary: 170 mg/dL — ABNORMAL HIGH (ref 70–99)
Glucose-Capillary: 73 mg/dL (ref 70–99)

## 2019-04-13 MED ORDER — LOSARTAN POTASSIUM 25 MG PO TABS: 12.5000 mg | ORAL_TABLET | Freq: Every day | ORAL | Status: DC

## 2019-04-13 MED ORDER — LOSARTAN POTASSIUM 25 MG PO TABS: 12.5000 mg | ORAL_TABLET | Freq: Every day | ORAL | 3 refills | Status: DC

## 2019-04-13 MED ORDER — ONDANSETRON HCL 4 MG PO TABS: 4.0000 mg | ORAL_TABLET | Freq: Four times a day (QID) | ORAL | 0 refills | Status: DC | PRN

## 2019-04-13 NOTE — Discharge Instructions (Signed)
Thank you for allowing [place] to participate in your care!    You were admitted for chest pain and we ruled out heart attack. It will be important for you to follow up with your cardiologist and the lipid clinic.   Please be sure to take all of your medications including your statin (cholesterol med) and coreg. We have started you on a medication called losartan which is also heart protective.   You will need to come into the family medicine clinic for labs on 08/17 at 10:00. Then we have scheduled you follow up with your primary care physician Dr. Pilar Plate on 04/21/2019 at 10:10am.   If you experience worsening of your admission symptoms, develop shortness of breath, life threatening emergency, suicidal or homicidal thoughts you must seek medical attention immediately by calling 911 or calling your MD immediately  if symptoms less severe.

## 2019-04-13 NOTE — Progress Notes (Signed)
Progress Note  Patient Name: Andrew Huerta Date of Encounter: 04/13/2019  Primary Cardiologist: Jullian Clayson Martinique, MD   Subjective   Feels better today. Did take Ntg last night for left precordial chest pain- similar to episodes he has been having. No N/V or abdominal pain. No sweating.  Inpatient Medications    Scheduled Meds:  aspirin EC  81 mg Oral Daily   atorvastatin  80 mg Oral Daily   carvedilol  3.125 mg Oral BID WC   clopidogrel  75 mg Oral Daily   enoxaparin (LOVENOX) injection  40 mg Subcutaneous Q24H   insulin aspart  0-9 Units Subcutaneous TID WC   pantoprazole  40 mg Oral Daily   Continuous Infusions:  PRN Meds: HYDROcodone-acetaminophen, nitroGLYCERIN, ondansetron **OR** ondansetron (ZOFRAN) IV, polyethylene glycol   Vital Signs    Vitals:   04/12/19 1712 04/12/19 2005 04/13/19 0321 04/13/19 0845  BP: 132/81 101/65 104/88 120/81  Pulse: 69 69 63 69  Resp:  (!) 8 18   Temp: (!) 97.5 F (36.4 C) 97.7 F (36.5 C) 97.8 F (36.6 C)   TempSrc: Oral Oral Oral   SpO2: 94% 95% 99%   Weight:   70.4 kg   Height:        Intake/Output Summary (Last 24 hours) at 04/13/2019 0928 Last data filed at 04/12/2019 1157 Gross per 24 hour  Intake --  Output 400 ml  Net -400 ml   Last 3 Weights 04/13/2019 04/12/2019 01/14/2019  Weight (lbs) 155 lb 3.2 oz 154 lb 15.7 oz 158 lb  Weight (kg) 70.398 kg 70.3 kg 71.668 kg      Telemetry    NSR - Personally Reviewed  ECG    NSR with inferior Q waves - Personally Reviewed  Physical Exam   GEN: No acute distress.   Neck: No JVD Cardiac: RRR, no murmurs, rubs, or gallops.  Respiratory: Clear to auscultation bilaterally. GI: Soft, nontender, non-distended  MS: No edema; No deformity. Neuro:  Nonfocal  Psych: Normal affect   Labs    High Sensitivity Troponin:   Recent Labs  Lab 04/11/19 1947 04/11/19 2223  TROPONINIHS 6 6      Cardiac EnzymesNo results for input(s): TROPONINI in the last 168 hours. No  results for input(s): TROPIPOC in the last 168 hours.   Chemistry Recent Labs  Lab 04/11/19 1947 04/12/19 1802  NA 138  --   K 4.0  --   CL 104  --   CO2 24  --   GLUCOSE 145*  --   BUN 16  --   CREATININE 1.15  --   CALCIUM 8.9  --   PROT  --  6.6  ALBUMIN  --  3.9  AST  --  18  ALT  --  18  ALKPHOS  --  48  BILITOT  --  0.9  GFRNONAA >60  --   GFRAA >60  --   ANIONGAP 10  --      Hematology Recent Labs  Lab 04/11/19 1947  WBC 10.5  RBC 4.02*  HGB 12.5*  HCT 36.4*  MCV 90.5  MCH 31.1  MCHC 34.3  RDW 13.0  PLT 211    BNPNo results for input(s): BNP, PROBNP in the last 168 hours.   DDimer No results for input(s): DDIMER in the last 168 hours.   Radiology    Dg Chest 2 View  Result Date: 04/11/2019 CLINICAL DATA:  Chest pain EXAM: CHEST - 2 VIEW COMPARISON:  Jan 03, 2019 FINDINGS: The heart size and mediastinal contours are within normal limits. Both lungs are clear. The visualized skeletal structures are unremarkable. IMPRESSION: No active cardiopulmonary disease. Electronically Signed   By: Constance Holster M.D.   On: 04/11/2019 20:06   Mr Abdomen Wo Contrast  Result Date: 04/12/2019 CLINICAL DATA:  Evaluate adrenal nodule. EXAM: MRI ABDOMEN WITHOUT CONTRAST TECHNIQUE: Multiplanar multisequence MR imaging was performed without the administration of intravenous contrast. COMPARISON:  CT 05/13/2016 FINDINGS: Lower chest: No acute findings. Hepatobiliary: Mild hepatic steatosis. No focal liver abnormality identified. No biliary ductal dilatation. Collapsed gallbladder containing stones measuring up to 3 mm. Pancreas: No mass, inflammatory changes, or other parenchymal abnormality identified. Spleen:  Within normal limits in size and appearance. Adrenals/Urinary Tract: There is a 2.5 cm right adrenal gland nodule which exhibits diffuse loss of signal on the out of phase sequences compatible with a benign adrenal gland adenoma. Adjacent right adrenal gland nodule  measures 2.0 cm and also loses signal from the inphase to add of phase sequences compatible with a benign adenoma. Normal left adrenal gland. Right kidney cyst measures 1.8 cm. No mass or hydronephrosis identified bilaterally. Stomach/Bowel: Visualized portions within the abdomen are unremarkable. Vascular/Lymphatic: No pathologically enlarged lymph nodes identified. No abdominal aortic aneurysm demonstrated. Other:  No free fluid or fluid collections Musculoskeletal: No suspicious bone lesions identified. IMPRESSION: 1. Right adrenal nodules have signal characteristics compatible with a benign adenoma. 2. Mild hepatic steatosis. 3. Gallstones. Electronically Signed   By: Kerby Moors M.D.   On: 04/12/2019 13:22    Cardiac Studies   ECHO: 01/04/2019 1. The left ventricle has moderately reduced systolic function, with an ejection fraction of 35-40%. The cavity size was moderately dilated. There is mildly increased left ventricular wall thickness. Left ventricular diastolic parameters were normal. 2. New inferior RWMA since previous study. 3. The right ventricle has normal systolic function. The cavity was normal. There is no increase in right ventricular wall thickness. 4. Left atrial size was mildly dilated. 5. Mild thickening of the mitral valve leaflet. 6. The aortic valve is tricuspid. Mild thickening of the aortic valve. Mild calcification of the aortic valve. No stenosis of the aortic valve.  CATH: 01/04/2019  Mid RCA lesion is 99% stenosed.  Mid RCA to Dist RCA lesion is 100% stenosed.  Ost LAD to Prox LAD lesion is 40% stenosed.  Previously placed Prox LAD stent (unknown type) is widely patent.  Prox LAD to Mid LAD lesion is 80% stenosed.  A stent was successfully placed.  Post intervention, there is a 0% residual stenosis.  Post intervention, there is a 0% residual stenosis.  Mid LAD lesion is 30% stenosed.  Previously placed Ost Cx to Prox Cx stent (unknown type) is  widely patent.  Multivessel CAD with 40% smooth ostial proximal stenosis of the LAD with a septal perforating artery that arises from the ostium and has a 90% stenosis (not able to be depicted in the diagram), widely patent stent proximally followed by new 80% stenosis the on the stented segment and proximal to the mid diagonal vessels. There is 30% LAD stenosis beyond the mid diagonal vessels; patent moderate-sized ramus intermediate vessel; patent proximal left circumflex stent; and diffuse 99% to 100% occlusion of the mid distal RCA with evidence for moderate left to right collateralization from the LAD, septal perforating arteries, and LCX.  LVEDP 17 mmHg.  Successful PCI to the 80% LAD stenosis with ultimate insertion of a 2.5 x 18 mm Resolute  Onyx DES stent postdilated to 2.6 mm with the 80% stenosis being reduced to 0%.  RECOMMENDATION: DAPT for minimum of 1 year and possibly long-term due to the patient's significant CAD history. Aggressive lipid-lowering therapy with target LDL less than 70. Medical therapy for RCA occlusion with collateralization. Diagnostic Dominance: Right  Intervention     Patient Profile     53 y.o. male with a hx DES dLAD 04/2014, DES mLAD 01/04/2019 w/ RCA 100% (chronic), chronic back pain, DM 2, HTN, HLD, Barrett's esophagus, history of adrenal tumor (pheo) s/padrenal gland resection at [facility], primary hyperaldosteronism s/padrenalectomy, and OSA, DJD, GERD, who is being seen today for the evaluation of chest pain at the request of Dr Nori Riis.  Assessment & Plan    Impression: 1. Chest pain. Doubt ACS given normal troponin and no Ecg changes. Certainly has potential ischemia in RCA territory as it is occluded and this could lead to angina with pertubations in BP. For now would continue his cardiac therapy. His chest pain could also be related to repetitive N/V with history of HH with Barrett's esophagus. I would continue Coreg low dose, DAPT and  statin 2. DM per primary care. Considering Victoza 3. HTN labile. By history sounds like he became vagal and may have had a drop in BP but he did not check at the time. BP stable now 4. HLD. LDL still high at 167 despite lipitor 80 mg daily. Patient reports compliance. Given aggressive CAD needs more attention to this. Recommend referral to our lipid clinic to see if he could get on a PCSK 9 inhibitor with patient assistance or whether he would qualify for a clinical trial.  5. CAD s/p multiple coronary stents and known occlusion of RCA. S/p repeat stenting of LAD 3 months ago. On DAPT. Doubt stent thrombosis.  6. Right adrenal adenomas. Appear benign based on MRI 7. Cholelithiasis. Multiple gallstones noted on MRI with collapsed gallbladder. LFTs normal. No abdominal tenderness. Would monitor for now. If he continues to have N/V or abdominal pain may need further evaluation. Not a candidate for surgery anyway for at least another 3 months due to stent.   [facility] will sign off.  OK for DC from our standpoint.  Medication Recommendations:  As per [facility] Other recommendations (labs, testing, etc):  Will arrange follow up in our lipid clinic as outpatient. Follow up as an outpatient:  Will arrange follow up in our office.   For questions or updates, please contact [place] Please consult [email or web address] for contact info under        Signed, Dalbert Stillings Martin[place], MD  04/13/2019, 9:28 AM

## 2019-04-18 ENCOUNTER — Other Ambulatory Visit: Payer: Self-pay

## 2019-04-18 DIAGNOSIS — I5021 Acute systolic (congestive) heart failure: Secondary | ICD-10-CM

## 2019-04-19 LAB — BASIC METABOLIC PANEL
BUN/Creatinine Ratio: 19 (ref 9–20)
BUN: 17 mg/dL (ref 6–24)
CO2: 22 mmol/L (ref 20–29)
Calcium: 9.8 mg/dL (ref 8.7–10.2)
Chloride: 102 mmol/L (ref 96–106)
Creatinine, Ser: 0.88 mg/dL (ref 0.76–1.27)
GFR calc Af Amer: 113 mL/min/{1.73_m2} (ref >59)
GFR calc non Af Amer: 98 mL/min/{1.73_m2} (ref >59)
Glucose: 133 mg/dL — ABNORMAL HIGH (ref 65–99)
Potassium: 4.5 mmol/L (ref 3.5–5.2)
Sodium: 138 mmol/L (ref 134–144)

## 2019-04-21 ENCOUNTER — Ambulatory Visit (INDEPENDENT_AMBULATORY_CARE_PROVIDER_SITE_OTHER): Payer: Self-pay | Admitting: Family Medicine

## 2019-04-21 ENCOUNTER — Encounter: Payer: Self-pay | Admitting: Family Medicine

## 2019-04-21 ENCOUNTER — Other Ambulatory Visit: Payer: Self-pay

## 2019-04-21 VITALS — BP 110/58 | HR 83 | Ht 67.0 in | Wt 158.5 lb

## 2019-04-21 DIAGNOSIS — F332 Major depressive disorder, recurrent severe without psychotic features: Secondary | ICD-10-CM

## 2019-04-21 DIAGNOSIS — R079 Chest pain, unspecified: Secondary | ICD-10-CM

## 2019-04-21 DIAGNOSIS — G894 Chronic pain syndrome: Secondary | ICD-10-CM

## 2019-04-21 DIAGNOSIS — N521 Erectile dysfunction due to diseases classified elsewhere: Secondary | ICD-10-CM

## 2019-04-21 MED ORDER — HYDROCODONE-ACETAMINOPHEN 5-325 MG PO TABS: 1.0000 | ORAL_TABLET | Freq: Three times a day (TID) | ORAL | 0 refills | Status: DC | PRN

## 2019-04-21 MED ORDER — VENLAFAXINE HCL ER 37.5 MG PO CP24: 37.5000 mg | ORAL_CAPSULE | Freq: Every day | ORAL | 2 refills | Status: DC

## 2019-04-21 MED ORDER — SILDENAFIL CITRATE 50 MG PO TABS: 50.0000 mg | ORAL_TABLET | ORAL | 1 refills | Status: DC | PRN

## 2019-04-21 NOTE — Progress Notes (Addendum)
Subjective:  Andrew Huerta is a 53 y.o. male who presents to the [facility] today with a chief complaint of hospital follow-up.   HPI: Chest pain hospital follow-up Andrew Huerta reports that since his hospital discharge, he has experienced roughly 3 episodes of chest pain.  On 2 occasions, he was able to take sublingual nitroglycerin which did not make any significant difference in his chest pain/discomfort at that time.  We reviewed his hospitalization plan reiterated that cardiology did not find any likely heart related cause for his chest pain.  Due to his history of Barrett's esophagus and a hiatal hernia the next appropriate step in the work-up for his chest pain is to follow-up with GI.  His lab work was reviewed and ultimately unremarkable  Depression This is been a very difficult year for Andrew Huerta.  Earlier in the year, his mother died followed by the death of his wife which may have been by intentional overdose.  He now has the responsibility of caring for his own children in addition to his wife's children.  He works 2 jobs part-time and does not have the benefit of health insurance is another drug provides Scientist, product/process development for part-time workers.  On top of these issues, he has compounding medical problems.  A PHQ 9 performed in the office showed a total score of 19 with 1 out of 3 noted for question 9 (thoughts that you would be better off dead, or of hurting yourself).  Andrew Huerta reported that he did have passive thoughts of suicidal ideation although he has no plan in place.  He also reports that he feels very strongly I do think suicide is his own life has been significantly impacted by the suicides of others.  Erectile dysfunction He noted that his sildenafil was discontinued in the hospital because he is simultaneously prescribed sublingual nitroglycerin.  He wanted to know if it would be possible to have his sildenafil prescribed as long as he understood should not be taken at the  same time as a nitroglycerin.  Lack of insurance Neither of his jobs currently provide insurance for part-time workers.  During his hospitalization social work applied to have him approved for [employer].  He was denied.  Chief Complaint noted Review of Symptoms - see HPI PMH - Smoking status noted.    Objective:  Physical Exam: BP (!) 110/58   Pulse 83   Ht 5\' 7"  (1.702 m)   Wt 71.9 kg   SpO2 99%   BMI 24.82 kg/m    Gen: NAD, resting comfortably.  No chest pain present during clinic visit. CV: RRR with no murmurs appreciated Pulm: NWOB, CTAB with no crackles, wheezes, or rhonchi GI: Normal bowel sounds present. Soft, Nontender, Nondistended. MSK: no edema, cyanosis, or clubbing noted Skin: warm, dry Neuro: grossly normal, moves all extremities  No results found for this or any previous visit (from the past 72 hour(s)).   Assessment/Plan:  Chest pain To recent hospitalization ruled out likely cardiac causes.  The next step in the work-up is to touch base with [facility] to see if his history of Barrett's esophagus and hiatal hernia is contributing to his ongoing chest pain.  It is likely that his current depression is exacerbating the syndrome and his symptoms. -He was told to call you with GI for a follow-up appointment and to discuss possible payment options given his current lack of medical insurance  MDD (major depressive disorder) Current PHQ 9: 19.  Passive suicidal  ideation.  No plan.  Strongly opposes suicide.  Is in favor of restarting medical therapy with venlafaxine (has previously used).  Does not want to participate in office visits for therapy because of the significant interruption to his work week (currently works 7 days a week to support his children).  He is open to phone calls from behavioral health. -Start venlafaxine 37.5 mg -Follow-up in 4 weeks -Message sent to [name] regarding behavioral health interventions.  Erectile dysfunction He does not take  nitroglycerin sublingual tablets every day.  He was told not to take nitro glycerin tablets and sildenafil in the same day.  He acknowledged understanding why this is important and how could lead to hypotension. -Sildenafil prescribed  Uninsured -message sent to Leory Plowman regarding an orange card.

## 2019-04-21 NOTE — Patient Instructions (Addendum)
Chest pain: Please call [facility] with regard to an appointment for further evaluation of your chest pain.  Her manager inquired your insurance situation and what payment options would be available to you.  Depression: Today were going to start medication called venlafaxine.  This medication can take 4 to 6 weeks to see significant effect.  With follow-up in 4 weeks to see how you are doing.  I will also send a message to see what resources might be available to you for possible therapy.  ED: Do not take a nitro tablet at the same time as Viagra.

## 2019-04-22 NOTE — Assessment & Plan Note (Signed)
He does not take nitroglycerin sublingual tablets every day.  He was told not to take nitro glycerin tablets and sildenafil in the same day.  He acknowledged understanding why this is important and how could lead to hypotension. -Sildenafil prescribed

## 2019-04-22 NOTE — Assessment & Plan Note (Signed)
To recent hospitalization ruled out likely cardiac causes.  The next step in the work-up is to touch base with [facility] to see if his history of Barrett's esophagus and hiatal hernia is contributing to his ongoing chest pain.  It is likely that his current depression is exacerbating the syndrome and his symptoms. -He was told to call you with GI for a follow-up appointment and to discuss possible payment options given his current lack of medical insurance

## 2019-04-22 NOTE — Assessment & Plan Note (Signed)
Current PHQ 9: 19.  Passive suicidal ideation.  No plan.  Strongly opposes suicide.  Is in favor of restarting medical therapy with venlafaxine (has previously used).  Does not want to participate in office visits for therapy because of the significant interruption to his work week (currently works 7 days a week to support his children).  He is open to phone calls from behavioral health. -Start venlafaxine 37.5 mg -Follow-up in 4 weeks -Message sent to [name] regarding behavioral health interventions.

## 2019-04-27 ENCOUNTER — Telehealth: Payer: Self-pay | Admitting: Licensed Clinical Social Worker

## 2019-04-27 NOTE — Telephone Encounter (Signed)
   Care Coordination Social Work Note  04/27/2019 Name: Andrew Huerta MRN: [id number] DOB: 06/22/1966  Andrew Huerta is a 53 y.o. year old male who sees Matilde Haymaker, MD for primary care. LCSW was consulted for assistance with Mental Health Counseling and Resources. Called patient to assess needs and barriers reference the above referral Assessment :  Patient has started taking Venlafaxine as prescribed  PCP.  Reports going to counseling about 5 plus years ago and did not think it was helpful to him.  Patient is open and willing to look into community resources and depression educational material provided by LCSW.  Recommendation:  Patient may benefit from ongoing counseling however he has reservations that is would help. Interventions:Patient interviewed and appropriate assessments performed Provided patient with information about mental health providers that do not require insurance, as well as patient information on depression via EMMI patient education. Advised patient to contact [facility] or [facility] [place] for ongoing counseling options. Plan:  1. Patient will consider contacting the providers shared today did not committee to doing this. 2. Patient declined F/U call from LCSW he will contact the office if needed.  Dr. Pilar Plate has been informed of this outreach and plan.  [name], LCSW Clinical Social Worker [place] / [place]   [phone] 12:21 PM

## 2019-04-29 ENCOUNTER — Ambulatory Visit (INDEPENDENT_AMBULATORY_CARE_PROVIDER_SITE_OTHER): Payer: Self-pay | Admitting: Pharmacist

## 2019-04-29 ENCOUNTER — Other Ambulatory Visit: Payer: Self-pay

## 2019-04-29 DIAGNOSIS — E785 Hyperlipidemia, unspecified: Secondary | ICD-10-CM | POA: Insufficient documentation

## 2019-04-29 NOTE — Progress Notes (Signed)
Patient ID: Andrew Huerta                 DOB: 08-Jan-1966                    MRN: [id number]     HPI: Andrew Huerta is a 53 y.o. male patient referred to lipid clinic by Dr Martinique. PMH significant for CAD s/p 3 stents, HFrEF, GERD, HTN, DM presenting with chronic chest pain. Patient was referred to lipid clinic after most recent admission for potential PCSK9i intitiation and counseling.   Current Medications:  atorvastatin 80mg  daily  Intolerances: none   LDL goal: < 70mg /dL (50mg /dL ideal)  Diet: try to do well but not sure what to eat. He is eating lots of fruits like water melon and bananas.  Exercise: has 2 jobs and moves a lot but no formal form of exercise.  Family History: family history includes Cancer in his father; Diabetes in his father and paternal grandmother; Heart attack in his paternal grandfather.  Social History: occasional alcohol , denies tobacco use, uses marijuana  Labs: 04/12/2019: CHO 251; TG 190; HDL 44; LDL-c 169 (atorvastatin 80mg  daily)  Past Medical History:  Diagnosis Date  . Adrenal tumor    a. s/p adrenal gland resection at [facility]. left side removal per patient  . Anxiety   . Barrett's esophagus    EGD - 11/27/09 - short segment of Barrett's  . CAD (coronary artery disease)    a. 04/27/14 [place] s/p DES to mLAD and DES to mLCx  . Chronic back pain    upper and lower per patient  . Chronic chest pain   . Degenerative joint disease    Bilateral knees. Significant knee pain since playing football in high school. also in back per patient  . Depression   . Diabetes mellitus type 2, controlled ([place]) 08/29/2015   Diagnosed by A1c 7.6% during 08/26/15 hospital admission for chest pain  . Gastroesophageal reflux disease with hiatal hernia   . GERD (gastroesophageal reflux disease)   . Hiatal hernia    EGD - 11/27/2009  . Hyperlipidemia   . Hypertension   . Low back pain   . Primary hyperaldosteronism ([place]) 01/22/2012   S/P adrenalectomy     . PTSD  (post-traumatic stress disorder)   . Sleep apnea   . Stone, kidney   . Syncope 04/10/2019    Current Outpatient Medications on File Prior to Visit  Medication Sig Dispense Refill  . aspirin EC 81 MG tablet Take 81 mg by mouth daily.    Marland Kitchen atorvastatin (LIPITOR) 80 MG tablet Take 1 tablet (80 mg total) by mouth daily. 30 tablet 2  . carvedilol (COREG) 3.125 MG tablet Take 1 tablet (3.125 mg total) by mouth 2 (two) times daily with a meal. 180 tablet 3  . clopidogrel (PLAVIX) 75 MG tablet Take 1 tablet (75 mg total) by mouth daily. 30 tablet 11  . HYDROcodone-acetaminophen (NORCO/VICODIN) 5-325 MG tablet Take 1-2 tablets by mouth every 8 (eight) hours as needed for moderate pain. 100 tablet 0  . losartan (COZAAR) 25 MG tablet Take 0.5 tablets (12.5 mg total) by mouth at bedtime. 60 tablet 3  . metFORMIN (GLUCOPHAGE) 500 MG tablet Take 1 tablet (500 mg total) by mouth 2 (two) times daily with a meal. (Patient taking differently: Take 500 mg by mouth daily with breakfast. ) 90 tablet 3  . nitroGLYCERIN (NITROSTAT) 0.4 MG SL tablet Place 1 tablet (0.4 mg total)  under the tongue every 5 (five) minutes as needed for chest pain. 100 tablet 3  . ondansetron (ZOFRAN) 4 MG tablet Take 1 tablet (4 mg total) by mouth every 6 (six) hours as needed for nausea. 20 tablet 0  . pantoprazole (PROTONIX) 40 MG tablet Take 1 tablet (40 mg total) by mouth daily. 30 tablet 3  . sildenafil (VIAGRA) 50 MG tablet Take 1 tablet (50 mg total) by mouth as needed for erectile dysfunction. 20 tablet 1  . venlafaxine XR (EFFEXOR XR) 37.5 MG 24 hr capsule Take 1 capsule (37.5 mg total) by mouth daily. 30 capsule 2   No current facility-administered medications on file prior to visit.     Allergies  Allergen Reactions  . Cortisone Acetate Swelling    Swelling at injection site  . Darvocet [Propoxyphene N-Acetaminophen] Nausea And Vomiting  . Nsaids Nausea And Vomiting and Other (See Comments)    Caused stomach ulcers in  the past   . Xanax [Alprazolam] Other (See Comments)    Pt states causes him to be mean, irritable  . Tape Itching and Rash    Hyperlipidemia LDL goal <70 LDL remains extremely elevated for secondary prevention. Mr Eliassen reports compliance with medication [but problems getting approval for medicaid or disability; therefore is unable to afford PCSK9i as recommended.  We discuss positive lifestyle modifications like eating a more balanced diet including lean protein, avoid bacon, butter, preserved meals , and add low sugar vegetables and salad to his fresh fruits intake.   Repatha/Praluent benefits, administration, common side effects , and patient assistance were discussed. Repatha SureClick 140mg  sample was provided (lot [id number], exp 06-2021) for self-administration in clinic.   Paperwork for [employer] was completed during this visit to apply to received product free of charge for the next 12 months. Will Barista and/or social worker for assistance with medicaid application or any other financial assistance available for this patient.     Krystl Wickware Rodriguez-Guzman PharmD, BCPS, [place] [address] 05/02/2019 8:01 AM

## 2019-04-29 NOTE — Patient Instructions (Addendum)
Lipid Clinic (pharmacist) Kalia Vahey/Kristin [phone]  *START paperwork for Repatha SureClick 140mg  every 14 days* *Continue all other medication as previously prescribed* *Check fasting blood work 2 months after stating Repatha*    High Cholesterol  High cholesterol is a condition in which the blood has high levels of a white, waxy, fat-like substance (cholesterol). The human body needs small amounts of cholesterol. The liver makes all the cholesterol that the body needs. Extra (excess) cholesterol comes from the food that we eat. Cholesterol is carried from the liver by the blood through the blood vessels. If you have high cholesterol, deposits (plaques) may build up on the walls of your blood vessels (arteries). Plaques make the arteries narrower and stiffer. Cholesterol plaques increase your risk for heart attack and stroke. Work with your health care provider to keep your cholesterol levels in a healthy range. What increases the risk? This condition is more likely to develop in people who:  Eat foods that are high in animal fat (saturated fat) or cholesterol.  Are overweight.  Are not getting enough exercise.  Have a family history of high cholesterol. What are the signs or symptoms? There are no symptoms of this condition. How is this diagnosed? This condition may be diagnosed from the results of a blood test.  If you are older than age 54, your health care provider may check your cholesterol every 4-6 years.  You may be checked more often if you already have high cholesterol or other risk factors for heart disease. The blood test for cholesterol measures:  "Bad" cholesterol (LDL cholesterol). This is the main type of cholesterol that causes heart disease. The desired level for LDL is less than 100.  "Good" cholesterol (HDL cholesterol). This type helps to protect against heart disease by cleaning the arteries and carrying the LDL away. The desired level for HDL is 60 or  higher.  Triglycerides. These are fats that the body can store or burn for energy. The desired number for triglycerides is lower than 150.  Total cholesterol. This is a measure of the total amount of cholesterol in your blood, including LDL cholesterol, HDL cholesterol, and triglycerides. A healthy number is less than 200. How is this treated? This condition is treated with diet changes, lifestyle changes, and medicines. Diet changes  This may include eating more whole grains, fruits, vegetables, nuts, and fish.  This may also include cutting back on red meat and foods that have a lot of added sugar. Lifestyle changes  Changes may include getting at least 40 minutes of aerobic exercise 3 times a week. Aerobic exercises include walking, biking, and swimming. Aerobic exercise along with a healthy diet can help you maintain a healthy weight.  Changes may also include quitting smoking. Medicines  Medicines are usually given if diet and lifestyle changes have failed to reduce your cholesterol to healthy levels.  Your health care provider may prescribe a statin medicine. Statin medicines have been shown to reduce cholesterol, which can reduce the risk of heart disease. Follow these instructions at home: Eating and drinking If told by your health care provider:  Eat chicken (without skin), fish, veal, shellfish, ground [place] breast, and round or loin cuts of red meat.  Do not eat fried foods or fatty meats, such as hot dogs and salami.  Eat plenty of fruits, such as apples.  Eat plenty of vegetables, such as broccoli, potatoes, and carrots.  Eat beans, peas, and lentils.  Eat grains such as barley, rice, couscous, and bulgur  wheat.  Eat pasta without cream sauces.  Use skim or nonfat milk, and eat low-fat or nonfat yogurt and cheeses.  Do not eat or drink whole milk, cream, ice cream, egg yolks, or hard cheeses.  Do not eat stick margarine or tub margarines that contain trans  fats (also called partially hydrogenated oils).  Do not eat saturated tropical oils, such as coconut oil and palm oil.  Do not eat cakes, cookies, crackers, or other baked goods that contain trans fats.  General instructions  Exercise as directed by your health care provider. Increase your activity level with activities such as gardening, walking, and taking the stairs.  Take over-the-counter and prescription medicines only as told by your health care provider.  Do not use any products that contain nicotine or tobacco, such as cigarettes and e-cigarettes. If you need help quitting, ask your health care provider.  Keep all follow-up visits as told by your health care provider. This is important. Contact a health care provider if:  You are struggling to maintain a healthy diet or weight.  You need help to start on an exercise program.  You need help to stop smoking. Get help right away if:  You have chest pain.  You have trouble breathing. This information is not intended to replace advice given to you by your health care provider. Make sure you discuss any questions you have with your health care provider. Document Released: 08/18/2005 Document Revised: 08/21/2017 Document Reviewed: 02/16/2016 Elsevier Patient Education  2020 [employer].

## 2019-05-02 ENCOUNTER — Encounter: Payer: Self-pay | Admitting: Pharmacist

## 2019-05-02 NOTE — Assessment & Plan Note (Signed)
LDL remains extremely elevated for secondary prevention. Andrew Huerta reports compliance with medication [but problems getting approval for medicaid or disability; therefore is unable to afford PCSK9i as recommended.  We discuss positive lifestyle modifications like eating a more balanced diet including lean protein, avoid bacon, butter, preserved meals , and add low sugar vegetables and salad to his fresh fruits intake.   Repatha/Praluent benefits, administration, common side effects , and patient assistance were discussed. Repatha SureClick 140mg  sample was provided (lot [id number], exp 06-2021) for self-administration in clinic.   Paperwork for [employer] was completed during this visit to apply to received product free of charge for the next 12 months. Will Barista and/or social worker for assistance with medicaid application or any other financial assistance available for this patient.

## 2019-05-03 ENCOUNTER — Ambulatory Visit (INDEPENDENT_AMBULATORY_CARE_PROVIDER_SITE_OTHER): Payer: Self-pay | Admitting: Physician Assistant

## 2019-05-03 ENCOUNTER — Encounter: Payer: Self-pay | Admitting: Physician Assistant

## 2019-05-03 ENCOUNTER — Other Ambulatory Visit: Payer: Self-pay

## 2019-05-03 VITALS — BP 116/68 | HR 82 | Temp 96.8°F | Ht 67.0 in | Wt 154.2 lb

## 2019-05-03 DIAGNOSIS — E785 Hyperlipidemia, unspecified: Secondary | ICD-10-CM

## 2019-05-03 DIAGNOSIS — I255 Ischemic cardiomyopathy: Secondary | ICD-10-CM

## 2019-05-03 DIAGNOSIS — I10 Essential (primary) hypertension: Secondary | ICD-10-CM

## 2019-05-03 DIAGNOSIS — E119 Type 2 diabetes mellitus without complications: Secondary | ICD-10-CM

## 2019-05-03 DIAGNOSIS — I25119 Atherosclerotic heart disease of native coronary artery with unspecified angina pectoris: Secondary | ICD-10-CM

## 2019-05-03 MED ORDER — ISOSORBIDE MONONITRATE ER 30 MG PO TB24: 15.0000 mg | ORAL_TABLET | Freq: Every day | ORAL | 0 refills | Status: DC

## 2019-05-03 NOTE — Patient Instructions (Signed)
Medication Instructions:   HOLD Losartan  START Imdur 15 mg daily If you need a refill on your cardiac medications before your next appointment, please call your pharmacy.   Lab work: NONE ordered at this time of appointment   If you have labs (blood work) drawn today and your tests are completely normal, you will receive your results only by: Marland Kitchen MyChart Message (if you have MyChart) OR . A paper copy in the mail If you have any lab test that is abnormal or we need to change your treatment, we will call you to review the results.  Testing/Procedures: NONE ordered at this time of appointment   Follow-Up: At [facility], you and your health needs are our priority.  As part of our continuing mission to provide you with exceptional heart care, we have created designated Provider Care Teams.  These Care Teams include your primary Cardiologist (physician) and Advanced Practice Providers (APPs -  Physician Assistants and Nurse Practitioners) who all work together to provide you with the care you need, when you need it. Your physician recommends that you schedule a follow-up appointment in: 2 weeks with Almyra Deforest, PA-C   Any Other Special Instructions Will Be Listed Below (If Applicable).

## 2019-05-03 NOTE — Progress Notes (Signed)
Cardiology Office Note   Date:  05/04/2019   ID:  Andrew Huerta, Andrew Huerta 07/19/1966, MRN [id number]  PCP:  Matilde Haymaker, MD  Cardiologist:  Dr. Martinique  Chief Complaint  Patient presents with  . Follow-up    seen for Dr. Martinique.      History of Present Illness: Andrew Huerta is a 53 y.o. male who presents for cardiology visit. She has PMH of CAD, chronic back pain, DM 2, Barrett's esophagus, history of adrenal tumor s/p adrenal gland resection at [facility], hypertension, hyperlipidemia, primary hyperaldosteronism s/p adrenalectomy, and obstructive sleep apnea.  Patient had a abnormal stress test in 2015.  Subsequent cardiac catheterization showed two-vessel disease.  He underwent stenting of mid LAD and mid left circumflex.  Due to recurrent chest discomfort, he had repeat cardiac catheterization later in the year that showed patent stents.  He was readmitted in November 2015 for recurrent chest pain.  Myoview was normal.  Cardiac catheterization was performed in July 2016 which showed patent stents.  More recently, patient was admitted in May 2020 with vague chest discomfort.  Troponin was negative.  Echocardiogram obtained on 01/04/2019 showed EF 35 to 40%, new inferior wall motion abnormality.  Cardiac catheterization performed on 01/04/2019 revealed a 99% mid RCA disease, 100% mid to distal RCA lesion with distal left to right collateralization from LAD, 40% ostial to proximal LAD disease, widely patent proximal LAD stent, 80% proximal to mid LAD lesion treated with a 2.5 x 18 mm resolute Onyx DES postdilated to 2.6 mm, patent ostial to proximal left circumflex stent.  Lipid test obtained during the admission showed LDL of 136.  Postprocedure, he was placed on aspirin Plavix.  When I saw the patient back on 01/19/2019, he was still having intermittent chest discomfort, I added Ranexa 500 mg twice daily to his antianginal regimen.  More recently, he was admitted on 04/11/2019 with nausea, vomiting,  fatigue and the severe sweating.  He was seen by Dr. Martinique in the hospital, who felt this is unlikely ACS.  It was suspected his nausea and vomiting is related to history of Barrett's esophagus.  He was recently seen by lipid clinic on 04/21/2019 for consideration of Repatha.  Patient presents today for cardiology office visit.  He continues to work at [employer].  He continued to have intermittent left-sided chest discomfort that lasted about 5 minutes but may occurs 2-5 times on a daily basis.  He also continued to have nausea and vomiting as well.  He has been compliant with his Protonix dose.  We discussed various options.  I wish to try another trial on the antianginal medication.  I will hold his losartan for the time being to create enough blood pressure room for addition of 15 mg of Imdur.   Past Medical History:  Diagnosis Date  . Adrenal tumor    a. s/p adrenal gland resection at [facility]. left side removal per patient  . Anxiety   . Barrett's esophagus    EGD - 11/27/09 - short segment of Barrett's  . CAD (coronary artery disease)    a. 04/27/14 [place] s/p DES to mLAD and DES to mLCx  . Chronic back pain    upper and lower per patient  . Chronic chest pain   . Degenerative joint disease    Bilateral knees. Significant knee pain since playing football in high school. also in back per patient  . Depression   . Diabetes mellitus type 2,  controlled ([place]) 08/29/2015   Diagnosed by A1c 7.6% during 08/26/15 hospital admission for chest pain  . Gastroesophageal reflux disease with hiatal hernia   . GERD (gastroesophageal reflux disease)   . Hiatal hernia    EGD - 11/27/2009  . Hyperlipidemia   . Hypertension   . Low back pain   . Primary hyperaldosteronism ([place]) 01/22/2012   S/P adrenalectomy     . PTSD (post-traumatic stress disorder)   . Sleep apnea   . Stone, kidney   . Syncope 04/10/2019    Past Surgical History:  Procedure Laterality Date  . ADRENALECTOMY   10/2013  . CARDIAC CATHETERIZATION  05/01/2014   Patent stents, other disease unchanged  . CARDIAC CATHETERIZATION  04/27/2014   Procedure: CORONARY STENT INTERVENTION;  Surgeon: Peter M Martinique, MD;  Location: [facility] CATH LAB;  Service: Cardiovascular;;  DES mid Cx  DES mid LAD  . CARDIAC CATHETERIZATION N/A 03/22/2015   Procedure: Left Heart Cath and Coronary Angiography;  Surgeon: Sherren Mocha, MD;  Location: [facility] CV LAB;  Service: Cardiovascular;  Laterality: N/A;  . CORONARY ANGIOPLASTY WITH STENT PLACEMENT  04/27/2014   3.0 x 16 mm Promus DES to the mid LAD and 3.5 x 28 mm Promus to the mid LCx, otherwise 20-30 percent lesions, EF 55%  . CORONARY STENT INTERVENTION N/A 01/04/2019   Procedure: CORONARY STENT INTERVENTION;  Surgeon: Troy Sine, MD;  Location: [facility] CV LAB;  Service: Cardiovascular;  Laterality: N/A;  . KIDNEY STONE SURGERY  X 1  . KNEE ARTHROPLASTY Right 1984  . KNEE ARTHROSCOPY Right X 6  . LEFT HEART CATH AND CORONARY ANGIOGRAPHY N/A 01/04/2019   Procedure: LEFT HEART CATH AND CORONARY ANGIOGRAPHY;  Surgeon: Troy Sine, MD;  Location: [facility] CV LAB;  Service: Cardiovascular;  Laterality: N/A;  . LEFT HEART CATHETERIZATION WITH CORONARY ANGIOGRAM N/A 04/27/2014   Procedure: LEFT HEART CATHETERIZATION WITH CORONARY ANGIOGRAM;  Surgeon: Peter M Martinique, MD;  Location: [facility] CATH LAB;  Service: Cardiovascular;  Laterality: N/A;  . LEFT HEART CATHETERIZATION WITH CORONARY ANGIOGRAM N/A 05/01/2014   Procedure: LEFT HEART CATHETERIZATION WITH CORONARY ANGIOGRAM;  Surgeon: Burnell Blanks, MD;  Location[facility] C[facility] LAB;  Service: Cardiovascular;  Laterality: N/A;  . LITHOTRIPSY  X 2     Current Outpatient Medications  Medication Sig Dispense Refill  . aspirin EC 81 MG tablet Take 81 mg by mouth daily.    Marland Kitchen atorvastatin (LIPITOR) 80 MG tablet Take 1 tablet (80 mg total) by mouth daily. 30 tablet 2  . carvedilol (COREG) 3.125 MG tablet Take 1 tablet (3.125 mg  total) by mouth 2 (two) times daily with a meal. 180 tablet 3  . clopidogrel (PLAVIX) 75 MG tablet Take 1 tablet (75 mg total) by mouth daily. 30 tablet 11  . HYDROcodone-acetaminophen (NORCO/VICODIN) 5-325 MG tablet Take 1-2 tablets by mouth every 8 (eight) hours as needed for moderate pain. 100 tablet 0  . losartan (COZAAR) 25 MG tablet Take 0.5 tablets (12.5 mg total) by mouth at bedtime. 60 tablet 3  . metFORMIN (GLUCOPHAGE) 500 MG tablet Take 1 tablet (500 mg total) by mouth 2 (two) times daily with a meal. (Patient taking differently: Take 500 mg by mouth daily with breakfast. ) 90 tablet 3  . nitroGLYCERIN (NITROSTAT) 0.4 MG SL tablet Place 1 tablet (0.4 mg total) under the tongue every 5 (five) minutes as needed for chest pain. 100 tablet 3  . ondansetron (ZOFRAN) 4 MG tablet Take 1 tablet (  4 mg total) by mouth every 6 (six) hours as needed for nausea. 20 tablet 0  . pantoprazole (PROTONIX) 40 MG tablet Take 1 tablet (40 mg total) by mouth daily. 30 tablet 3  . sildenafil (VIAGRA) 50 MG tablet Take 1 tablet (50 mg total) by mouth as needed for erectile dysfunction. 20 tablet 1  . isosorbide mononitrate (IMDUR) 30 MG 24 hr tablet Take 0.5 tablets (15 mg total) by mouth daily. 15 tablet 0   No current facility-administered medications for this visit.     Allergies:   Cortisone acetate, Darvocet [propoxyphene n-acetaminophen], Nsaids, Xanax [alprazolam], and Tape    Social History:  The patient  reports that he has never smoked. He has never used smokeless tobacco. He reports current alcohol use. He reports current drug use. Drug: Marijuana.   Family History:  The patient's family history includes Cancer in his father; Diabetes in his father and paternal grandmother; Heart attack in his paternal grandfather.    ROS:  Please see the history of present illness.   Otherwise, review of systems are positive for none.   All other systems are reviewed and negative.    PHYSICAL EXAM: VS:  BP  116/68   Pulse 82   Temp (!) 96.8 F (36 C) (Temporal)   Ht 5\' 7"  (1.702 m)   Wt 154 lb 3.2 oz (69.9 kg)   SpO2 98%   BMI 24.15 kg/m  , BMI Body mass index is 24.15 kg/m. GEN: Well nourished, well developed, in no acute distress  HEENT: normal  Neck: no JVD, carotid bruits, or masses Cardiac: RRR; no murmurs, rubs, or gallops,no edema  Respiratory:  clear to auscultation bilaterally, normal work of breathing GI: soft, nontender, nondistended, + BS MS: no deformity or atrophy  Skin: warm and dry, no rash Neuro:  Strength and sensation are intact Psych: euthymic mood, full affect   EKG:  EKG is not ordered today.   Recent Labs: 04/11/2019: Hemoglobin 12.5; Platelets 211 04/12/2019: ALT 18 04/18/2019: BUN 17; Creatinine, Ser 0.88; Potassium 4.5; Sodium 138    Lipid Panel    Component Value Date/Time   CHOL 251 (H) 04/12/2019 0427   CHOL 235 (H) 10/01/2018 0912   TRIG 190 (H) 04/12/2019 0427   HDL 44 04/12/2019 0427   HDL 55 10/01/2018 0912   CHOLHDL 5.7 04/12/2019 0427   VLDL 38 04/12/2019 0427   LDLCALC 169 (H) 04/12/2019 0427   LDLCALC 152 (H) 10/01/2018 0912   LDLDIRECT 169 (H) 07/08/2011 0944      Wt Readings from Last 3 Encounters:  05/03/19 154 lb 3.2 oz (69.9 kg)  04/29/19 158 lb (71.7 kg)  04/21/19 158 lb 8 oz (71.9 kg)      Other studies Reviewed: Additional studies/ records that were reviewed today include:   Echo 01/04/2019  Mid RCA lesion is 99% stenosed.  Mid RCA to Dist RCA lesion is 100% stenosed.  Ost LAD to Prox LAD lesion is 40% stenosed.  Previously placed Prox LAD stent (unknown type) is widely patent.  Prox LAD to Mid LAD lesion is 80% stenosed.  A stent was successfully placed.  Post intervention, there is a 0% residual stenosis.  Post intervention, there is a 0% residual stenosis.  Mid LAD lesion is 30% stenosed.  Previously placed Ost Cx to Prox Cx stent (unknown type) is widely patent.   Multivessel CAD with 40% smooth  ostial proximal stenosis of the LAD with a septal perforating artery that arises from the  ostium and has a 90% stenosis (not able to be depicted in the diagram), widely patent stent proximally followed by new 80% stenosis the on the stented segment and proximal to the mid diagonal vessels.  There is 30% LAD stenosis beyond the mid diagonal vessels; patent moderate-sized ramus intermediate vessel; patent proximal left circumflex stent; and diffuse 99% to 100% occlusion of the mid distal RCA with evidence for moderate left to right collateralization from the LAD, septal perforating arteries, and LCX.  LVEDP 17 mmHg.  Successful PCI to the 80% LAD stenosis with ultimate insertion of a 2.5 x 18 mm Resolute Onyx DES stent postdilated to 2.6 mm with the 80% stenosis being reduced to 0%.  RECOMMENDATION: DAPT for minimum of 1 year and possibly long-term due to the patient's significant CAD history.  Aggressive lipid-lowering therapy with target LDL less than 70.  Medical therapy for RCA occlusion with collateralization.   Echo 01/04/2019 IMPRESSIONS    1. The left ventricle has moderately reduced systolic function, with an ejection fraction of 35-40%. The cavity size was moderately dilated. There is mildly increased left ventricular wall thickness. Left ventricular diastolic parameters were normal.  2. New inferior RWMA since previous study.  3. The right ventricle has normal systolic function. The cavity was normal. There is no increase in right ventricular wall thickness.  4. Left atrial size was mildly dilated.  5. Mild thickening of the mitral valve leaflet.  6. The aortic valve is tricuspid. Mild thickening of the aortic valve. Mild calcification of the aortic valve. No stenosis of the aortic valve.   Review of the above records demonstrates:   Recent DES to LAD.  Ischemic cardiomyopathy with baseline EF 35 to 40%   ASSESSMENT AND PLAN:  1.  CAD: Continue to have intermittent chest  discomfort.  He was recently seen in the hospital by Dr. Martinique who felt that this is unlikely to be ACS.  He has been compliant with Protonix therapy.  I wish to do another titration of antianginal therapy.  In order to create enough blood pressure room, I took him off of losartan and placed him on low-dose Imdur.  2.  Ischemic cardiomyopathy: EF 35 to 40% on last echocardiogram.  Euvolemic on physical exam  3.  Hypertension: Blood pressure stable, I took him off of losartan and placed him on low-dose Imdur for antianginal purposes.  4.  Hyperlipidemia: Continue on current statin therapy  5.  DM 2: Managed by primary care provider.  Previously on losartan for renal protective effect and also for LV dysfunction.  Due to persistent intermittent chest discomfort, I placed him on low-dose Imdur.  We can potentially restart losartan in the future if chest discomfort improves.   Current medicines are reviewed at length with the patient today.  The patient does not have concerns regarding medicines.  The following changes have been made: Stop losartan, start on 15 mg daily of Imdur  Labs/ tests ordered today include: N/A No orders of the defined types were placed in this encounter.    Disposition:   FU with me in 3 weeks  Hilbert Corrigan PA 05/04/2019 10:28 PM    [facility] [place], [place], [place]  [address] Phone: [phone]; Fax: [phone]

## 2019-05-04 ENCOUNTER — Encounter: Payer: Self-pay | Admitting: Physician Assistant

## 2019-05-05 ENCOUNTER — Telehealth: Payer: Self-pay

## 2019-05-05 NOTE — Telephone Encounter (Signed)
Called to follow up on pt assistance referral. Pt states that he has already filled out a form. Will follow up with provider.

## 2019-05-26 ENCOUNTER — Encounter: Payer: Self-pay | Admitting: Family Medicine

## 2019-05-26 ENCOUNTER — Other Ambulatory Visit: Payer: Self-pay

## 2019-05-26 ENCOUNTER — Ambulatory Visit (INDEPENDENT_AMBULATORY_CARE_PROVIDER_SITE_OTHER): Payer: Self-pay | Admitting: Family Medicine

## 2019-05-26 DIAGNOSIS — G894 Chronic pain syndrome: Secondary | ICD-10-CM

## 2019-05-26 DIAGNOSIS — F332 Major depressive disorder, recurrent severe without psychotic features: Secondary | ICD-10-CM

## 2019-05-26 MED ORDER — SERTRALINE HCL 50 MG PO TABS: 50.0000 mg | ORAL_TABLET | Freq: Every day | ORAL | 3 refills | Status: DC

## 2019-05-26 MED ORDER — HYDROCODONE-ACETAMINOPHEN 5-325 MG PO TABS: 1.0000 | ORAL_TABLET | Freq: Three times a day (TID) | ORAL | 0 refills | Status: DC | PRN
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
## 2019-05-26 NOTE — Patient Instructions (Addendum)
Depression: Lets starting to for medication.  This was called sertraline.  Begin by taking 50 mg once daily.  I like to see you again in 4 weeks to see if you feel any different with this medication.  We can easily increase this medication.  In the meantime, please see the attached resources below.  Please consider scheduling an appointment for behavioral therapy for depression with 1 of these locations.  Outpatient Mental Health Providers (No Insurance at time of Visit or Self Pay)  [facility] Mon-Fri, 8:30-5:00  [address] [place], [place] [address]    [phone] [email or web address]  [phone] (Immediate assistance)  [facility]    Walk-in Mon-Fri, 8am-3pm [address], [place], [place]  [email or web address]  [facility] [place] ([facility] [place]) walk in M-F 8am-12pm and  1pm-3pm [place]- [place]  [place]  Phone: [phone]  [facility] ([place] and substance challenges) [address], [place] [phone]    [email or web address]    [facility] [place], [place]     Phone:  [phone] [place]  [phone]         [facility] Walk-in MWF 12:30 to 3:00     [place] [address]  [phone]  [email or web address] call to schedule an appointment    [facility] ,Support group, Peer support services, [address], [place], [place] [address] [phone]  [email or web address]           [facility] Mental [facility] (NAMI) [place]- Wellness classes, Support groups        [address] [place], [place] [address] [phone]   [email or web address]  [facility]  (Psycho-social Rehabilitation clubhouse, Individual and group therapy) [address] [place], [place] [address]   [phone]  24- Hour Availability:  *[place] or [phone] * [facility] [employer] (Domestic Violence, Rape, etc. )[phone] [place] [phone] or [phone] * [facility] [phone] only) [phone] (after hours) *[facility] 2133123546 *[place] National Suicide Hotline (210)502-3691 Diamantina Monks)

## 2019-05-27 ENCOUNTER — Other Ambulatory Visit: Payer: Self-pay

## 2019-05-27 MED ORDER — PANTOPRAZOLE SODIUM 40 MG PO TBEC: 40.0000 mg | DELAYED_RELEASE_TABLET | Freq: Every day | ORAL | 3 refills | Status: DC

## 2019-05-27 NOTE — Assessment & Plan Note (Signed)
We discussed on the dangers of using opioids long-term.  That he is not excited by decreasing his pain medication he understood the importance. -Refilled Norco at usual dose today -Plan to decrease subsequent refills by 10% -PDMP reviewed

## 2019-05-27 NOTE — Assessment & Plan Note (Signed)
-  Discontinue venlafaxine -Start sertraline 50 mg daily -Behavioral health resources for patients without insurance provided up by handout -Follow-up in 4 weeks

## 2019-05-29 ENCOUNTER — Other Ambulatory Visit: Payer: Self-pay | Admitting: Physician Assistant

## 2019-06-06 ENCOUNTER — Other Ambulatory Visit: Payer: Self-pay | Admitting: Student

## 2019-06-15 ENCOUNTER — Telehealth: Payer: Self-pay | Admitting: Physician Assistant

## 2019-06-15 NOTE — Telephone Encounter (Signed)
Called the pt after reaching out to [employer] and the SNF foundation stated that they needed a denial letter for medicaid or they need him to reapply for it

## 2019-06-15 NOTE — Telephone Encounter (Signed)
° ° °  Patient wants to clarify  If / when he needs to see Pharm D.   Also is additional testing needed prior to appointment with Eulas Post

## 2019-06-27 ENCOUNTER — Other Ambulatory Visit: Payer: Self-pay

## 2019-06-27 ENCOUNTER — Encounter: Payer: Self-pay | Admitting: Family Medicine

## 2019-06-27 ENCOUNTER — Ambulatory Visit (INDEPENDENT_AMBULATORY_CARE_PROVIDER_SITE_OTHER): Payer: Self-pay | Admitting: Family Medicine

## 2019-06-27 VITALS — BP 124/70 | HR 84 | Ht 67.0 in | Wt 156.0 lb

## 2019-06-27 DIAGNOSIS — F332 Major depressive disorder, recurrent severe without psychotic features: Secondary | ICD-10-CM

## 2019-06-27 DIAGNOSIS — E119 Type 2 diabetes mellitus without complications: Secondary | ICD-10-CM

## 2019-06-27 DIAGNOSIS — G894 Chronic pain syndrome: Secondary | ICD-10-CM

## 2019-06-27 LAB — POCT GLYCOSYLATED HEMOGLOBIN (HGB A1C): HbA1c, POC (controlled diabetic range): 8.4 % — AB (ref 0.0–7.0)

## 2019-06-27 MED ORDER — HYDROCODONE-ACETAMINOPHEN 5-325 MG PO TABS: 1.0000 | ORAL_TABLET | Freq: Three times a day (TID) | ORAL | 0 refills | Status: DC | PRN

## 2019-06-27 NOTE — Progress Notes (Signed)
Subjective:  Andrew Huerta is a 53 y.o. male who presents to the [facility] today with a chief complaint of MDD follow up.   HPI:  Depression Andrew Huerta reports mild improvement in his depression in some ways and otherwise he feels that his symptoms are worse.  He has fewer episodes of tearing up and crying although he also feels that his temper is shorter and he more frequently has angry outbursts.  He also notes fatigue that he associates with the medication.  He reports that he will fall asleep after taking the medication if he does not have anything to occupy his attention.  PHQ-9 scored a 14 today reduce from his previous score of 16.  Chronic pain There are 2 components to Andrew Huerta chronic pain: Vertebral injuries that he reports are beyond repair and osteoarthritis of his knees for which he is undergone at least 6 procedures.  His back pain remains between a 7 and 8 out of 10 on a daily basis.  He reports a credibly uncomfortable dull spreading sensation along his back and occasional shortness of breath related to this pain.  His knee pain is also a daily issue because his work requires that he remain standing for most of the shift.  Previous procedures have been unsuccessful and he has been recommended to have a total knee replacement of the right knee and potentially left.  He has not able to have a knee replacement at this time due to lack of insurance and lack of funds.  He also finds it necessary to prioritize his family with what funds he has available.  Chief Complaint noted Review of Symptoms - see HPI PMH - Smoking status noted.    Objective:  Physical Exam: BP 124/70   Pulse 84   Ht 5\' 7"  (1.702 m)   Wt 156 lb (70.8 kg)   SpO2 98%   BMI 24.43 kg/m    Gen: NAD, resting comfortably.  In slightly improved spirits compared to previous visits. MSK: no edema, cyanosis, or clubbing noted Skin: warm, dry Neuro: grossly normal, moves all extremities Psych: Normal affect  and thought content  Results for orders placed or performed in visit on 06/27/19 (from the past 72 hour(s))  HgB A1c     Status: Abnormal   Collection Time: 06/27/19  3:37 PM  Result Value Ref Range   Hemoglobin A1C     HbA1c POC (<> result, manual entry)     HbA1c, POC (prediabetic range)     HbA1c, POC (controlled diabetic range) 8.4 (A) 0.0 - 7.0 %     Assessment/Plan:  MDD (major depressive disorder) Mild subjective and objective improvement this visit.  We discussed potentially increasing his dose versus leaving it where it is currently.  Andrew Huerta preferred leaving the dose unchanged is he is concerned his short temper may worsen with an increased dose.  He did note a 1 on the PHQ-9 regarding the question of harming himself.  He has answered with this response previously.  Today he reports that he would never harm himself due to his believes and his responsibility to care for his children.  He has no plan in mind he simply notes that the idea passes through his mind occasionally. -Sertraline 50 mg daily -Follow-up in 4 weeks  Chronic back pain Prolonged discussion today about different options for pain control.  Options are limited due to history of CAD, lack of insurance, lack of funds.  He has previously tried  Voltaren gel and is not interested in topical treatments right now.  Ultimately, he is agreeable to increasing his Tylenol use in order to reduce his dependence on Vicodin.  His Vicodin dose was not changed today. -Increase Tylenol use to 2000 mg daily in addition to Vicodin -Vicodin 5/325 refilled -PMDP reviewed

## 2019-06-27 NOTE — Patient Instructions (Addendum)
Depression:  It sounds like there has been a little bit of progress in some areas and worsening in others. Let's focus on what's getting better.  We'll leave the medication dose alone for now and talk about it again in 4 weeks.  Chronic pain:  Lots of number crunching today. My apologies for the confusion.  No changes to your Vicodin for now but begin taking more Tylenol.  If you buy the 500mg  tablets, you can take 4 tablets daily in addition to your Vicodin without any concern of taking too much.  If you can only find the 650mg  tablets, I would still stick to 3 pills per day in addition to the Vicodin.  We'll follow up in 4 weeks.

## 2019-06-27 NOTE — Assessment & Plan Note (Signed)
Prolonged discussion today about different options for pain control.  Options are limited due to history of CAD, lack of insurance, lack of funds.  He has previously tried Voltaren gel and is not interested in topical treatments right now.  Ultimately, he is agreeable to increasing his Tylenol use in order to reduce his dependence on Vicodin.  His Vicodin dose was not changed today. -Increase Tylenol use to 2000 mg daily in addition to Vicodin -Vicodin 5/325 refilled -PMDP reviewed

## 2019-06-27 NOTE — Assessment & Plan Note (Addendum)
Mild subjective and objective improvement this visit.  We discussed potentially increasing his dose versus leaving it where it is currently.  Andrew Huerta preferred leaving the dose unchanged as he is concerned his short temper may worsen with an increased dose.  He did note a 1 on the PHQ-9 regarding the question of harming himself.  He has answered with this response previously.  Today he reports that he would never harm himself due to his believes and his responsibility to care for his children.  He has no plan in mind he simply notes that the idea passes through his mind occasionally. -Sertraline 50 mg daily -Follow-up in 4 weeks

## 2019-07-06 ENCOUNTER — Other Ambulatory Visit: Payer: Self-pay | Admitting: Family Medicine

## 2019-07-06 ENCOUNTER — Telehealth: Payer: Self-pay | Admitting: Family Medicine

## 2019-07-06 DIAGNOSIS — K22719 Barrett's esophagus with dysplasia, unspecified: Secondary | ICD-10-CM

## 2019-07-06 NOTE — Telephone Encounter (Signed)
Will forward to MD to place a new referral.  Jazmin Hartsell,CMA  

## 2019-07-06 NOTE — Telephone Encounter (Signed)
Referral placed. Berish Bohman, MD  

## 2019-07-06 NOTE — Telephone Encounter (Signed)
Pt needs a new referral to see his GI doctor. The referral he has no has expired. He will be going to [facility]. jw

## 2019-07-14 ENCOUNTER — Other Ambulatory Visit: Payer: Self-pay

## 2019-07-14 ENCOUNTER — Ambulatory Visit (INDEPENDENT_AMBULATORY_CARE_PROVIDER_SITE_OTHER): Payer: Self-pay | Admitting: Physician Assistant

## 2019-07-14 ENCOUNTER — Encounter: Payer: Self-pay | Admitting: Physician Assistant

## 2019-07-14 VITALS — BP 109/75 | HR 80 | Ht 67.0 in | Wt 154.2 lb

## 2019-07-14 DIAGNOSIS — I25118 Atherosclerotic heart disease of native coronary artery with other forms of angina pectoris: Secondary | ICD-10-CM

## 2019-07-14 DIAGNOSIS — E785 Hyperlipidemia, unspecified: Secondary | ICD-10-CM

## 2019-07-14 DIAGNOSIS — E119 Type 2 diabetes mellitus without complications: Secondary | ICD-10-CM

## 2019-07-14 DIAGNOSIS — I10 Essential (primary) hypertension: Secondary | ICD-10-CM

## 2019-07-14 DIAGNOSIS — R079 Chest pain, unspecified: Secondary | ICD-10-CM
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
## 2019-07-14 NOTE — Patient Instructions (Addendum)
Medication Instructions:   Take your Carvedilol (Coreg) 3.125 mg 2 times a day *If you need a refill on your cardiac medications before your next appointment, please call your pharmacy*  Lab Work:  NONE ordered at this time of appointment   If you have labs (blood work) drawn today and your tests are completely normal, you will receive your results only by: Marland Kitchen MyChart Message (if you have MyChart) OR . A paper copy in the mail If you have any lab test that is abnormal or we need to change your treatment, we will call you to review the results.  Testing/Procedures:  NONE ordered at this time of appointment   Follow-Up: At [facility], you and your health needs are our priority.  As part of our continuing mission to provide you with exceptional heart care, we have created designated Provider Care Teams.  These Care Teams include your primary Cardiologist (physician) and Advanced Practice Providers (APPs -  Physician Assistants and Nurse Practitioners) who all work together to provide you with the care you need, when you need it.  Your next appointment:   2-3 months   The format for your next appointment:   In Person  Provider:   Peter Martinique, MD  Other Instructions

## 2019-07-16 ENCOUNTER — Encounter: Payer: Self-pay | Admitting: Physician Assistant

## 2019-07-22 ENCOUNTER — Other Ambulatory Visit: Payer: Self-pay | Admitting: Family Medicine

## 2019-07-22 ENCOUNTER — Other Ambulatory Visit: Payer: Self-pay

## 2019-07-22 DIAGNOSIS — G894 Chronic pain syndrome: Secondary | ICD-10-CM

## 2019-07-22 MED ORDER — HYDROCODONE-ACETAMINOPHEN 5-325 MG PO TABS: 1.0000 | ORAL_TABLET | Freq: Three times a day (TID) | ORAL | 0 refills | Status: DC | PRN

## 2019-07-27 ENCOUNTER — Other Ambulatory Visit: Payer: Self-pay

## 2019-07-27 ENCOUNTER — Encounter: Payer: Self-pay | Admitting: Family Medicine

## 2019-07-27 ENCOUNTER — Ambulatory Visit (INDEPENDENT_AMBULATORY_CARE_PROVIDER_SITE_OTHER): Payer: Self-pay | Admitting: Family Medicine

## 2019-07-27 VITALS — BP 110/62 | HR 81 | Ht 67.0 in | Wt 155.5 lb

## 2019-07-27 DIAGNOSIS — F332 Major depressive disorder, recurrent severe without psychotic features: Secondary | ICD-10-CM

## 2019-07-27 DIAGNOSIS — E119 Type 2 diabetes mellitus without complications: Secondary | ICD-10-CM

## 2019-07-27 DIAGNOSIS — E785 Hyperlipidemia, unspecified: Secondary | ICD-10-CM

## 2019-07-27 DIAGNOSIS — K22719 Barrett's esophagus with dysplasia, unspecified: Secondary | ICD-10-CM

## 2019-07-27 MED ORDER — SERTRALINE HCL 50 MG PO TABS: 100.0000 mg | ORAL_TABLET | Freq: Every day | ORAL | 3 refills | Status: DC

## 2019-07-27 MED ORDER — OZEMPIC (0.25 OR 0.5 MG/DOSE) 2 MG/1.5ML ~~LOC~~ SOPN: 0.2500 mg | PEN_INJECTOR | SUBCUTANEOUS | 0 refills | Status: DC

## 2019-07-27 NOTE — Progress Notes (Signed)
Subjective:  Andrew Huerta is a 53 y.o. male who presents to the [facility] today to follow-up regarding Andrew anxiety and also to discuss short and long-term medical goals.   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  He has 4 adult children that he feel responsible for (2 biological, 2 from Andrew deceased Huerta, ages 32,27,23,21).  One of these children requires high level of care due to Andrew ADHD and autism spectrum disorder.  Some of Andrew personal strengths include Andrew generally caring nature, amiability and ability to fit in in any situation.  Here thinks that Andrew experiencing varied settings makes him easygoing and easy to get along with in any situation.  Andrew Huerta reported that Andrew current concerns include: Caring for Andrew children, monitoring Andrew mental health, the health of Andrew heart, health of Andrew stomach, back and legs.  He has some concerns about developing cancer due to Andrew family history of lung cancer in Andrew mother stomach cancer in Andrew father.  He also knows that he has some risk for esophageal cancer due to Andrew history of Barrett's esophagus.  It is also worried about providing for Andrew kids when he is dead.  He chose to prioritize Andrew medical goals as follows  Immediate goals: -Managing Andrew chest pain -Improving Andrew "brain fog" -Getting a better understanding of Andrew medical issues Medium term goals: -Arriving at a definitive diagnosis for Andrew current abdominal/chest pain Long-term goals: -Maintaining regular cancer screening and appropriate preventive medicine  MDD Minimal change in Andrew depressive symptoms compared to previous visit.  He notes that he may be slightly less tearful than 1 month ago although he continues to feel tearful with memories of Andrew Huerta and finds that he is very easily agitated.  He also noted having clouded thinking.  He is currently taking sertraline 50  mg daily.  PHQ-9 score of 15 today with a score of 1 for question 9.  Chronic pain He continues to have significant pain in Andrew knees bilaterally and difficult back pain.  He reports that he attempted increasing Andrew Tylenol use in order to decrease Andrew dependence on Vicodin this past month.  He noted mild stomach irritation while taking Tylenol.  He has not decreased Andrew Vicodin use.  He is not interested in returning to physical therapy.  CAD Continues to experience occasional chest pain and is regularly followed by cardiology.  He did not experience any significant benefit from Ranexa.  In addition to Andrew high intensity statin, he is working with the lipid clinic to find coverage for [place].  He does not currently note any limitations to Andrew physical activity due to Andrew chest pain.  GERD He has follow-up scheduled at the gastroenterology clinic.  Andrew first visit will need to be virtual visit.  He knows that he is long overdue for appropriate surveillance for Andrew previous diagnosis of Barrett's esophagus.  DMII Currently taking Metformin 500 mg twice daily.  Last A1c noted to be elevated to 8.4 from Andrew previous 7.1.  He reports making dietary changes and attempts to have a healthy diet.  He is open to additional medications that be helpful.  Chief Complaint noted Review of Symptoms - see HPI PMH -  CAD with stenting x3, diabetes, hyperlipidemia, Barrett's esophagus, history of adrenal adenoma, MDD  Objective:  Physical Exam: BP 110/62   Pulse 81  Ht 5\' 7"  (1.702 m)   Wt 155 lb 8 oz (70.5 kg)   SpO2 97%   BMI 24.35 kg/m    Gen: NAD, resting comfortably CV: RRR, 2/6 systolic murmur loudest left sternal border.  Occasional dropped beats Pulm: NWOB, CTAB with no crackles, wheezes, or rhonchi MSK: Able to walk comfortably in and out of the exam room.  Normal gait. Psych: Normal affect and thought content  No results found for this or any previous visit (from the past 72 hour(s)).    Assessment/Plan:  CAD Continue to follow-up with cardiology for appropriate management.  Follow-up with lipid clinic for initiation of Repatha.  Barrett's esophagus -Follow-up with GI for appropriate surveillance of her Barrett's esophagus. -Continue pantoprazole  MDD (major depressive disorder) No significant changes from previous visit.  Plan to increase sertraline today.  A score of 1 on question 9, no SI/HI.  No plan for harming himself.  No firearms at home.  Similar to previous visits.  He reports that he would not hurt himself because he needs to be present to care for Andrew family.  We discussed adding CBT to Andrew therapy for additional benefit.  He was not interested in therapy at this time. -Increase sertraline to 100 mg daily -Provide therapy resources if/when interested -Follow-up in 4 to 6 weeks  Chronic back pain Advise taking Tylenol with food.  Tylenol is not generally associated with stomach upset.  Discussed tapering narcotic use.  Plan to decrease subsequent narcotic refill by 10%.  Controlled type 2 diabetes mellitus without complication, without long-term current use of insulin (HCC) Last A1c showed mild elevation up to 8.3.  He is amenable to starting new medication today.  Based on Andrew history of CAD, he would benefit from starting a GLP-1.  Ozempic sample provided in clinic today.  First Ozempic injection performed in clinic today under my supervision.  He was sent home with 2 Ozempic pens. -Ozempic, 0.25 mg weekly for 1 month followed by increase up to 0.5 mg weekly -Message to Andrew Huerta regarding medication assistance -Follow-up in 2 to 3 months  Medical complexity Andrew Huerta has multiple chronic diseases that require ongoing maintenance, he is depressed following the death of Andrew Huerta and mother this past year and is currently uninsured.  Due to Andrew medical complexity he may benefit from additional resources offered by chronic care management.  In addition to  potentially providing behavioral resources, he may benefit from medication assistance for Repatha or Ozempic.  It would also be incredibly helpful if he could be connected with health insurance. -Referral to chronic care management

## 2019-07-27 NOTE — Assessment & Plan Note (Signed)
Advise taking Tylenol with food.  Tylenol is not generally associated with stomach upset.  Discussed tapering narcotic use.  Plan to decrease subsequent narcotic refill by 10%.

## 2019-07-27 NOTE — Assessment & Plan Note (Signed)
>>  ASSESSMENT AND PLAN FOR OTHER GASTRITIS WITH BLEEDING WRITTEN ON 06/06/2024  2:44 PM BY ELICIA HAMLET, MD   >>ASSESSMENT AND PLAN FOR BARRETT'S ESOPHAGUS WRITTEN ON 07/27/2019  5:33 PM BY DEMPSEY COY, MD  -Follow-up with GI for appropriate surveillance of her Barrett's esophagus. -Continue pantoprazole

## 2019-07-27 NOTE — Assessment & Plan Note (Signed)
Continue to follow-up with cardiology for appropriate management.  Follow-up with lipid clinic for initiation of Repatha.

## 2019-07-27 NOTE — Assessment & Plan Note (Signed)
>>  ASSESSMENT AND PLAN FOR BARRETT'S ESOPHAGUS WRITTEN ON 07/27/2019  5:33 PM BY DEMPSEY COY, MD  -Follow-up with GI for appropriate surveillance of her Barrett's esophagus. -Continue pantoprazole

## 2019-07-27 NOTE — Patient Instructions (Addendum)
Depression - Let's increase your sertraline to 75 mg daily (1.5). If you feel good at that dose for one week, increase your dose to 100 mg (2 pills).  Diabetes - We are going to start a new medication today called ozempic.  This will be a weekly injection that will also help your coronary artery disease.  Chronic pain - I know this will be difficult, but we are planning on decreasing your dose by 10% at the next refill.   Insurance - I hope that will be approved for Medicaid. I will put in a referral to Chronic Care Management, we will see if there are any additional resources that we can give you.

## 2019-07-27 NOTE — Assessment & Plan Note (Signed)
Last A1c showed mild elevation up to 8.3.  He is amenable to starting new medication today.  Based on his history of CAD, he would benefit from starting a GLP-1.  Ozempic sample provided in clinic today.  First Ozempic injection performed in clinic today under my supervision.  He was sent home with 2 Ozempic pens. -Ozempic, 0.25 mg weekly for 1 month followed by increase up to 0.5 mg weekly -Message to Dr. Valentina Lucks regarding medication assistance -Follow-up in 2 to 3 months

## 2019-07-27 NOTE — Assessment & Plan Note (Signed)
No significant changes from previous visit.  Plan to increase sertraline today.  A score of 1 on question 9, no SI/HI.  No plan for harming himself.  No firearms at home.  Similar to previous visits.  He reports that he would not hurt himself because he needs to be present to care for his family.  We discussed adding CBT to his therapy for additional benefit.  He was not interested in therapy at this time. -Increase sertraline to 100 mg daily -Provide therapy resources if/when interested -Follow-up in 4 to 6 weeks

## 2019-07-27 NOTE — Assessment & Plan Note (Signed)
-  Follow-up with GI for appropriate surveillance of her Barrett's esophagus. -Continue pantoprazole

## 2019-07-27 NOTE — Addendum Note (Signed)
Addended by: Grant Ruts on: 07/27/2019 05:55 PM   Modules accepted: Orders

## 2019-08-01 ENCOUNTER — Other Ambulatory Visit: Payer: Self-pay

## 2019-08-01 DIAGNOSIS — G894 Chronic pain syndrome: Secondary | ICD-10-CM

## 2019-08-01 NOTE — Telephone Encounter (Signed)
Patient calls nurse line stating he has not been able to pick up hydrocodone and does not know why. I attempted to call the pharmacy, however I have not been able to get through to anyone. Patient informed of this and stated he was going there later today and will see what the issue is.

## 2019-08-02 ENCOUNTER — Ambulatory Visit: Payer: Self-pay | Admitting: Family Medicine

## 2019-08-02 ENCOUNTER — Other Ambulatory Visit: Payer: Self-pay | Admitting: Family Medicine

## 2019-08-02 DIAGNOSIS — G894 Chronic pain syndrome: Secondary | ICD-10-CM

## 2019-08-02 MED ORDER — HYDROCODONE-ACETAMINOPHEN 5-325 MG PO TABS: 1.0000 | ORAL_TABLET | Freq: Three times a day (TID) | ORAL | 0 refills | Status: DC | PRN

## 2019-08-02 NOTE — Telephone Encounter (Signed)
Refilling for Dr. Pilar Plate.  He assures me that refill is appropriate.

## 2019-08-03 ENCOUNTER — Telehealth: Payer: Self-pay | Admitting: *Deleted

## 2019-08-03 NOTE — Telephone Encounter (Signed)
Pt calls because he thinks that he allergic to Ozempic.  He reports itching and whelping @ injection site both times he injected.  He has not noticed a difference in his blood sugars and denies SOB, swelling or fever.   Appt made for the AM. Christen Bame, CMA

## 2019-08-04 ENCOUNTER — Ambulatory Visit: Payer: Self-pay | Admitting: Licensed Clinical Social Worker

## 2019-08-04 ENCOUNTER — Other Ambulatory Visit: Payer: Self-pay

## 2019-08-04 ENCOUNTER — Ambulatory Visit (INDEPENDENT_AMBULATORY_CARE_PROVIDER_SITE_OTHER): Payer: Self-pay | Admitting: Family Medicine

## 2019-08-04 DIAGNOSIS — Z5971 Insufficient health insurance coverage: Secondary | ICD-10-CM

## 2019-08-04 DIAGNOSIS — T8090XA Unspecified complication following infusion and therapeutic injection, initial encounter: Secondary | ICD-10-CM | POA: Insufficient documentation

## 2019-08-04 DIAGNOSIS — I10 Essential (primary) hypertension: Secondary | ICD-10-CM

## 2019-08-04 DIAGNOSIS — Z5989 Other problems related to housing and economic circumstances: Secondary | ICD-10-CM

## 2019-08-04 DIAGNOSIS — Z598 Other problems related to housing and economic circumstances: Secondary | ICD-10-CM

## 2019-08-04 NOTE — Patient Instructions (Signed)
It was a pleasure to see you today!  To summarize our discussion for this visit:  Today we looked at your injection site with the whole team.   Our plan is to have our pharmacy team call you prior to your next injection to see how you reacted to the second injection. During that call you can discuss whether or not to continue the medication.   If you do experience any increased size or discomfort with the rash, please call to let us know.  Call the clinic at [phone] if your symptoms worsen or you have any concerns.   Thank you for allowing me to take part in your care,  Dr. Doristine Mango

## 2019-08-04 NOTE — Assessment & Plan Note (Signed)
Abnormal injection site reaction.  Likely contributed by being on blood thinners. Examined by preceptor and Dr. Pollyann Savoy Developed plan to have pharmacy team contact patient prior to his next injection on Wednesday of next week to discuss his reaction to his second injection and whether or not the medication needs to be stopped. Can continue to take Benadryl as needed until then.

## 2019-08-04 NOTE — Chronic Care Management (AMB) (Signed)
  Chronic Care Management  Social Work Note  08/04/2019 Name: Andrew Huerta MRN: [id number] DOB: June 27, 1966  Andrew Huerta is a 53 y.o. year old male who sees Andrew Haymaker, MD for primary care. The CCM team was consulted by Andrew Huerta for assistance with [employer] for insurance options.  Andrew Huerta reviewed chart, Patient is being referred to the care guide to provide available community resources.  Care guide will consult Andrew Huerta if clinical needs are identified.  Outpatient Encounter Medications as of 08/04/2019  Medication Sig  . aspirin EC 81 MG tablet Take 81 mg by mouth daily.  Marland Kitchen atorvastatin (LIPITOR) 80 MG tablet TAKE ONE TABLET BY MOUTH DAILY  . carvedilol (COREG) 3.125 MG tablet Take 1 tablet (3.125 mg total) by mouth 2 (two) times daily with a meal.  . clopidogrel (PLAVIX) 75 MG tablet Take 1 tablet (75 mg total) by mouth daily.  Marland Kitchen HYDROcodone-acetaminophen (NORCO/VICODIN) 5-325 MG tablet Take 1-2 tablets by mouth every 8 (eight) hours as needed for moderate pain.  . metFORMIN (GLUCOPHAGE) 500 MG tablet Take 1 tablet (500 mg total) by mouth 2 (two) times daily with a meal. (Patient taking differently: Take 500 mg by mouth daily with breakfast. )  . nitroGLYCERIN (NITROSTAT) 0.4 MG SL tablet Place 1 tablet (0.4 mg total) under the tongue every 5 (five) minutes as needed for chest pain.  Marland Kitchen ondansetron (ZOFRAN) 4 MG tablet Take 1 tablet (4 mg total) by mouth every 6 (six) hours as needed for nausea.  . pantoprazole (PROTONIX) 40 MG tablet Take 1 tablet (40 mg total) by mouth daily.  . Semaglutide,0.25 or 0.5MG /DOS, (OZEMPIC, 0.25 OR 0.5 MG/DOSE,) 2 MG/1.5ML SOPN Inject 0.25 mg into the skin once a week.  . sertraline (ZOLOFT) 50 MG tablet Take 2 tablets (100 mg total) by mouth daily.  . sildenafil (VIAGRA) 50 MG tablet Take 1 tablet (50 mg total) by mouth as needed for erectile dysfunction.   No facility-administered encounter medications on file as of 08/04/2019.    Goals Addressed    None   Follow Up Plan: No additional Andrew Huerta services needed at this time  [name], [place] / [place]   [phone] 9:30 AM

## 2019-08-04 NOTE — Progress Notes (Signed)
   Subjective:    Patient ID: Andrew Huerta, male    DOB: [birth year], 53 y.o.   MRN: [id number]  CC: injection site reaction to ozempic  HPI:  Patient has received 2 doses of ozempic (one in office and one at home ysterday). About 4-5 days after his first injection the site developed a bruise and severe pruritus.  Patient tried Benadryl with temporary and minimal relief.  The rash has not spread since first started and there has been no rash or itching at his second injection site on his right lower abdomen from yesterday.  Patient is on aspirin and Plavix.  Denies any respiratory symptoms and otherwise feels well.  Smoking status reviewed   ROS: pertinent noted in the HPI  I have personally reviewed pertinent past medical history, surgical, family, and social history as appropriate. Objective:  BP 105/70   Pulse 82   Temp 97.8 F (36.6 C) (Oral)   Wt 151 lb (68.5 kg)   SpO2 97%   BMI 23.65 kg/m   Vitals and nursing note reviewed  General: NAD, pleasant, able to participate in exam Extremities: no edema or cyanosis. Skin: warm and dry, please see clinical images for further detail rashes noted: On left lower abdomen there is nonraised, erythema with poorly demarcated border and ecchymosis.  Negative for tenderness to palpation or increased warmth or induration at the site. Neuro: alert, no obvious focal deficits Psych: Normal affect and mood  First injection site given in office.   Second injection site given yesterday at home.    Assessment & Plan:   Injection site reaction Abnormal injection site reaction.  Likely contributed by being on blood thinners. Examined by preceptor and Dr. Pollyann Savoy Developed plan to have pharmacy team contact patient prior to his next injection on Wednesday of next week to discuss his reaction to his second injection and whether or not the medication needs to be stopped. Can continue to take Benadryl as needed until then.   Doristine Mango, [facility] PGY-2

## 2019-08-07 ENCOUNTER — Other Ambulatory Visit: Payer: Self-pay

## 2019-08-07 ENCOUNTER — Emergency Department (HOSPITAL_COMMUNITY): Payer: Self-pay

## 2019-08-07 ENCOUNTER — Encounter (HOSPITAL_COMMUNITY): Payer: Self-pay | Admitting: Emergency Medicine

## 2019-08-07 ENCOUNTER — Emergency Department (HOSPITAL_COMMUNITY)
Admission: EM | Admit: 2019-08-07 | Discharge: 2019-08-07 | Discharge status: Against Medical Advice | Payer: Self-pay | Attending: Emergency Medicine | Admitting: Emergency Medicine

## 2019-08-07 DIAGNOSIS — G8929 Other chronic pain: Secondary | ICD-10-CM

## 2019-08-07 DIAGNOSIS — Z7984 Long term (current) use of oral hypoglycemic drugs: Secondary | ICD-10-CM | POA: Insufficient documentation

## 2019-08-07 DIAGNOSIS — Z79899 Other long term (current) drug therapy: Secondary | ICD-10-CM | POA: Insufficient documentation

## 2019-08-07 DIAGNOSIS — E119 Type 2 diabetes mellitus without complications: Secondary | ICD-10-CM | POA: Insufficient documentation

## 2019-08-07 DIAGNOSIS — R079 Chest pain, unspecified: Secondary | ICD-10-CM

## 2019-08-07 DIAGNOSIS — I10 Essential (primary) hypertension: Secondary | ICD-10-CM | POA: Insufficient documentation

## 2019-08-07 DIAGNOSIS — M546 Pain in thoracic spine: Secondary | ICD-10-CM | POA: Insufficient documentation

## 2019-08-07 DIAGNOSIS — Z7982 Long term (current) use of aspirin: Secondary | ICD-10-CM | POA: Insufficient documentation

## 2019-08-07 DIAGNOSIS — R0789 Other chest pain: Secondary | ICD-10-CM | POA: Insufficient documentation

## 2019-08-07 DIAGNOSIS — I2510 Atherosclerotic heart disease of native coronary artery without angina pectoris: Secondary | ICD-10-CM | POA: Insufficient documentation

## 2019-08-07 LAB — CBC
HCT: 40.7 % (ref 39.0–52.0)
Hemoglobin: 13.6 g/dL (ref 13.0–17.0)
MCH: 30.8 pg (ref 26.0–34.0)
MCHC: 33.4 g/dL (ref 30.0–36.0)
MCV: 92.3 fL (ref 80.0–100.0)
Platelets: 206 10*3/uL (ref 150–400)
RBC: 4.41 MIL/uL (ref 4.22–5.81)
RDW: 12.7 % (ref 11.5–15.5)
WBC: 11.9 10*3/uL — ABNORMAL HIGH (ref 4.0–10.5)
nRBC: 0 % (ref 0.0–0.2)

## 2019-08-07 LAB — D-DIMER, QUANTITATIVE: D-Dimer, Quant: <0.27 ug/mL-FEU (ref 0.00–0.50)

## 2019-08-07 LAB — BASIC METABOLIC PANEL
Anion gap: 12 (ref 5–15)
BUN: 19 mg/dL (ref 6–20)
CO2: 25 mmol/L (ref 22–32)
Calcium: 9.3 mg/dL (ref 8.9–10.3)
Chloride: 99 mmol/L (ref 98–111)
Creatinine, Ser: 0.79 mg/dL (ref 0.61–1.24)
GFR calc Af Amer: >60 mL/min (ref >60)
GFR calc non Af Amer: >60 mL/min (ref >60)
Glucose, Bld: 201 mg/dL — ABNORMAL HIGH (ref 70–99)
Potassium: 4.3 mmol/L (ref 3.5–5.1)
Sodium: 136 mmol/L (ref 135–145)

## 2019-08-07 LAB — TROPONIN I (HIGH SENSITIVITY)
Troponin I (High Sensitivity): 4 ng/L (ref <18)
Troponin I (High Sensitivity): 4 ng/L (ref <18)

## 2019-08-07 MED ORDER — SODIUM CHLORIDE 0.9% FLUSH: 3.0000 mL | Freq: Once | INTRAVENOUS | Status: DC

## 2019-08-07 NOTE — Progress Notes (Signed)
Patient walked out had been waiting for Pain Med ??? Nursing notified.

## 2019-08-07 NOTE — ED Provider Notes (Signed)
ED ECG REPORT   Date: 08/07/2019  Rate: 71  Rhythm: normal sinus rhythm  QRS Axis: normal  Intervals: normal  ST/T Wave abnormalities: normal  Conduction Disutrbances:none  Narrative Interpretation:   Old EKG Reviewed: unchanged  I have personally reviewed the EKG tracing and agree with the computerized printout as noted. No significant change in EKG from August.  Does have some abnormal inferior Q waves but they were present before.   Fredia Sorrow, MD 08/07/19 2051

## 2019-08-07 NOTE — ED Provider Notes (Signed)
[facility] EMERGENCY DEPARTMENT Provider Note   CSN: [id number] Arrival date & time: 08/07/19  1549     History   Chief Complaint Chief Complaint  Patient presents with  . Chest Pain    HPI Andrew Huerta is a 53 y.o. male.     HPI   Andrew Huerta is a 53 y.o. male who presents to the Emergency Department complaining of midline middle back pain that began early this morning.  He describes hx of mid back pain secondary to compression fractures of his spine.  States pain is similar to previous, just increased intensity.  He took 3 hydrocodone tablets today without relief.  Pain aggravated by certain movements.  He developed nausea and had vomiting earlier.  Nausea has since resolved.  Describes having chest pain after the vomiting episodes.  Chest pain reported as "chronic" and followed for his chest pain by cardiology in [place].  He denies recent injury, shortness of breath, fever, chills.  No hx of injury.     Past Medical History:  Diagnosis Date  . Adrenal tumor    a. s/p adrenal gland resection at [facility]. left side removal per patient  . Anxiety   . Barrett's esophagus    EGD - 11/27/09 - short segment of Barrett's  . CAD (coronary artery disease)    a. 04/27/14 [place] s/p DES to mLAD and DES to mLCx  . Chronic back pain    upper and lower per patient  . Chronic chest pain   . Degenerative joint disease    Bilateral knees. Significant knee pain since playing football in high school. also in back per patient  . Depression   . Diabetes mellitus type 2, controlled ([place]) 08/29/2015   Diagnosed by A1c 7.6% during 08/26/15 hospital admission for chest pain  . Gastroesophageal reflux disease with hiatal hernia   . GERD (gastroesophageal reflux disease)   . Hiatal hernia    EGD - 11/27/2009  . Hyperlipidemia   . Hypertension   . Low back pain   . Primary hyperaldosteronism ([place]) 01/22/2012   S/P adrenalectomy     . PTSD (post-traumatic stress disorder)   . Sleep apnea    . Stone, kidney   . Syncope 04/10/2019    Patient Active Problem List   Diagnosis Date Noted  . Injection site reaction 08/04/2019  . Hyperlipidemia LDL goal <70 04/29/2019  . Adenoma of right adrenal gland   . Rash 02/17/2019  . Erectile dysfunction 02/08/2019  . Dizziness 01/10/2019  . Acute systolic heart failure ([place]) 01/07/2019  . Chest pain on exertion 01/03/2019  . Noncompliance w/medication treatment due to intermit use of medication 01/03/2019  . Diabetic neuropathy ([place]) 06/23/2017  . Skin abnormality 05/27/2017  . Seborrheic keratoses 05/20/2017  . Nevus, non-neoplastic 05/20/2017  . Renal cyst, right 11/12/2015  . Controlled type 2 diabetes mellitus without complication, without long-term current use of insulin ([place]) 08/29/2015  . Chronic back pain 04/27/2015  . Chest pain with moderate risk of acute coronary syndrome   . New onset seizure ([place]) 09/29/2014  . Encounter for chronic pain management 08/09/2014  . CAD   . Hyperlipidemia   . Nephrolithiasis 10/12/2013  . Primary hyperaldosteronism ([place]) 09/13/2013  . MDD (major depressive disorder) 07/26/2013  . Restless legs syndrome 01/05/2013  . PTSD (post-traumatic stress disorder) 05/07/2012  . Back pain, thoracic 04/14/2012  . Non-intractable vomiting 03/08/2012  . Gastroesophageal reflux disease with hiatal hernia 02/05/2012  . Hypercholesterolemia 02/05/2012  . Herniation  of nucleus pulposus 02/05/2012  . Apnea, sleep 02/05/2012  . Headache(784.0) 11/14/2011  . Chest pain 08/14/2011  . Enlarged prostate 03/26/2011  . Osteoarthrosis involving lower leg 01/08/2010  . Essential hypertension 05/02/2008  . Barrett's esophagus 10/29/2006    Past Surgical History:  Procedure Laterality Date  . ADRENALECTOMY  10/2013  . CARDIAC CATHETERIZATION  05/01/2014   Patent stents, other disease unchanged  . CARDIAC CATHETERIZATION  04/27/2014   Procedure: CORONARY STENT INTERVENTION;  Surgeon: Peter M Martinique, MD;   Location: [facility] CATH LAB;  Service: Cardiovascular;;  DES mid Cx  DES mid LAD  . CARDIAC CATHETERIZATION N/A 03/22/2015   Procedure: Left Heart Cath and Coronary Angiography;  Surgeon: Sherren Mocha, MD;  Location: [facility] CV LAB;  Service: Cardiovascular;  Laterality: N/A;  . CORONARY ANGIOPLASTY WITH STENT PLACEMENT  04/27/2014   3.0 x 16 mm Promus DES to the mid LAD and 3.5 x 28 mm Promus to the mid LCx, otherwise 20-30 percent lesions, EF 55%  . CORONARY STENT INTERVENTION N/A 01/04/2019   Procedure: CORONARY STENT INTERVENTION;  Surgeon: Troy Sine, MD;  Location: [place] CV LAB;  Service: Cardiovascular;  Laterality: N/A;  . KIDNEY STONE SURGERY  X 1  . KNEE ARTHROPLASTY Right 1984  . KNEE ARTHROSCOPY Right X 6  . LEFT HEART CATH AND CORONARY ANGIOGRAPHY N/A 01/04/2019   Procedure: LEFT HEART CATH AND CORONARY ANGIOGRAPHY;  Surgeon: Troy Sine, MD;  Location: [facility] CV LAB;  Service: Cardiovascular;  Laterality: N/A;  . LEFT HEART CATHETERIZATION WITH CORONARY ANGIOGRAM N/A 04/27/2014   Procedure: LEFT HEART CATHETERIZATION WITH CORONARY ANGIOGRAM;  Surgeon: Peter M Martinique, MD;  Location: [facility] CATH LAB;  Service: Cardiovascular;  Laterality: N/A;  . LEFT HEART CATHETERIZATION WITH CORONARY ANGIOGRAM N/A 05/01/2014   Procedure: LEFT HEART CATHETERIZATION WITH CORONARY ANGIOGRAM;  Surgeon: Burnell [place]anks, MD;  Location: [facility] CATH LAB;  Service: Cardiovascular;  Laterality: N/A;  . LITHOTRIPSY  X 2      Home Medications    Prior to Admission medications   Medication Sig Start Date End Date Taking? Authorizing Provider  aspirin EC 81 MG tablet Take 81 mg by mouth daily.    [provider]  atorvastatin (LIPITOR) 80 MG tablet TAKE ONE TABLET BY MOUTH DAILY 06/06/19   Sande Rives E, PA-C  carvedilol (COREG) 3.125 MG tablet Take 1 tablet (3.125 mg total) by mouth 2 (two) times daily with a meal. 01/07/19   Diallo, Abdoulaye, MD  clopidogrel (PLAVIX) 75 MG tablet  Take 1 tablet (75 mg total) by mouth daily. 01/06/19   Darreld Mclean, PA-C  HYDROcodone-acetaminophen (NORCO/VICODIN) 5-325 MG tablet Take 1-2 tablets by mouth every 8 (eight) hours as needed for moderate pain. 08/02/19   Zenia Resides, MD  metFORMIN (GLUCOPHAGE) 500 MG tablet Take 1 tablet (500 mg total) by mouth 2 (two) times daily with a meal. Patient taking differently: Take 500 mg by mouth daily with breakfast.  01/14/19   Rory Percy, DO  nitroGLYCERIN (NITROSTAT) 0.4 MG SL tablet Place 1 tablet (0.4 mg total) under the tongue every 5 (five) minutes as needed for chest pain. 01/05/19 01/05/20  Sande Rives E, PA-C  ondansetron (ZOFRAN) 4 MG tablet Take 1 tablet (4 mg total) by mouth every 6 (six) hours as needed for nausea. 04/13/19   Shirley, Martinique, DO  pantoprazole (PROTONIX) 40 MG tablet Take 1 tablet (40 mg total) by mouth daily. 05/27/19   Matilde Haymaker, MD  Semaglutide,0.25  or 0.5MG /DOS, (OZEMPIC, 0.25 OR 0.5 MG/DOSE,) 2 MG/1.5ML SOPN Inject 0.25 mg into the skin once a week. 07/27/19   Matilde Haymaker, MD  sertraline (ZOLOFT) 50 MG tablet Take 2 tablets (100 mg total) by mouth daily. 07/27/19   Matilde Haymaker, MD  sildenafil (VIAGRA) 50 MG tablet Take 1 tablet (50 mg total) by mouth as needed for erectile dysfunction. 04/21/19 04/20/20  Matilde Haymaker, MD    Family History Family History  Problem Relation Age of Onset  . Cancer Father        stomach cancer  . Diabetes Father   . Heart attack Paternal Grandfather   . Diabetes Paternal Grandmother     Social History Social History   Tobacco Use  . Smoking status: Never Smoker  . Smokeless tobacco: Never Used  Substance Use Topics  . Alcohol use: Yes    Alcohol/week: 0.0 standard drinks    Comment: "seldom" per patient  . Drug use: Yes    Types: Marijuana     Allergies   Cortisone acetate, Darvocet [propoxyphene n-acetaminophen], Nsaids, Xanax [alprazolam], and Tape   Review of Systems Review of Systems   Constitutional: Negative for chills and fever.  Respiratory: Negative for cough, chest tightness, shortness of breath and wheezing.   Cardiovascular: Positive for chest pain.  Gastrointestinal: Positive for nausea and vomiting. Negative for abdominal pain and diarrhea.  Musculoskeletal: Positive for back pain. Negative for neck pain.  Skin: Negative for rash and wound.  Neurological: Negative for dizziness, syncope, weakness, numbness and headaches.     Physical Exam Updated Vital Signs BP (!) 114/94 (BP Location: Right Arm)   Pulse 83   Temp 98 F (36.7 C) (Oral)   Resp 18   Ht 5\' 7"  ([id number] m)   Wt 68 kg   SpO2 100%   BMI 23.49 kg/m   Physical Exam Vitals signs and nursing note reviewed.  Constitutional:      General: He is not in acute distress.    Appearance: Normal appearance. He is well-developed. He is not ill-appearing.  Cardiovascular:     Rate and Rhythm: Normal rate and regular rhythm.     Pulses: Normal pulses.  Pulmonary:     Effort: Pulmonary effort is normal.  Chest:     Chest wall: No tenderness.  Abdominal:     General: There is no distension.     Palpations: Abdomen is soft.     Tenderness: There is no abdominal tenderness.  Musculoskeletal: Normal range of motion.        General: Tenderness present.     Comments: ttp of the midline thoracic spine.  No bony step offs.  Pain reproduced with SLR bilaterally left> right.  Hip flexors and extensors intact  Skin:    General: Skin is warm.     Capillary Refill: Capillary refill takes less than 2 seconds.     Findings: No erythema or rash.  Neurological:     General: No focal deficit present.     Mental Status: He is alert.     Sensory: No sensory deficit.     Motor: No weakness.      ED Treatments / Results  Labs (all labs ordered are listed, but only abnormal results are displayed) Labs Reviewed  BASIC METABOLIC PANEL - Abnormal; Notable for the following components:      Result Value    Glucose, Bld 201 (*)    All other components within normal limits  CBC - Abnormal; Notable for  the following components:   WBC 11.9 (*)    All other components within normal limits  D-DIMER, QUANTITATIVE (NOT AT [facility])  TROPONIN I (HIGH SENSITIVITY)  TROPONIN I (HIGH SENSITIVITY)    EKG None   EKG reviewed by Dr. Rogene Houston   Radiology Dg Chest 2 View  Result Date: 08/07/2019 CLINICAL DATA:  Mid sternal chest pain radiating to upper back, nausea, vomiting, generalized weakness, history of coronary artery disease post MI and coronary stenting, hypertension, type II diabetes mellitus EXAM: CHEST - 2 VIEW COMPARISON:  04/11/2019 FINDINGS: Normal heart size, mediastinal contours, and pulmonary vascularity. Lungs clear. No pulmonary infiltrate, pleural effusion or pneumothorax. Coronary stents noted. Bones unremarkable. IMPRESSION: Post coronary stenting. No acute abnormalities. Electronically Signed   By: Lavonia Dana M.D.   On: 08/07/2019 17:25    Procedures Procedures (including critical care time)  Medications Ordered in ED Medications  sodium chloride flush (NS) 0.9 % injection 3 mL (has no administration in time range)     Initial Impression / Assessment and Plan / ED Course  I have reviewed the triage vital signs and the nursing notes.  Pertinent labs & imaging results that were available during my care of the patient were reviewed by me and considered in my medical decision making (see chart for details).    Pt with mid back and chest pain.  Hx of same.  No reported trauma.  Vitals reviewed.  Radial pulses are symmetrical.  No focal neuro deficits.  Low clinical suspicion for dissection, but will obtain labs including d dimer, CXR and T-spine.  Patient upset about wait time, but agrees to plan    2110 notified by nursing that patient has left the dept. prior to completion of my work up    Final Clinical Impressions(s) / ED Diagnoses   Final diagnoses:  Chronic midline  thoracic back pain  Chest pain, unspecified type    ED Discharge Orders    None       Kem Parkinson, Hershal Coria 08/07/19 2124    Fredia Sorrow, MD 08/07/19 2357

## 2019-08-07 NOTE — ED Notes (Signed)
ED Provider at bedside. 

## 2019-08-07 NOTE — ED Triage Notes (Signed)
Patient c/o mid-sternal chest pain that radiates into upper back. Denies any shortness or breath, or dizziness. Patient does report nausea, vomiting, and generalized weakness. Patient states started today with mid back pain, he then vomited, and chest pain started. Hx of MIs and stent placements.

## 2019-08-07 NOTE — ED Notes (Signed)
Pt not in room , per staff he removed all monitoring and left . Provider notified

## 2019-08-09 ENCOUNTER — Other Ambulatory Visit: Payer: Self-pay

## 2019-08-09 ENCOUNTER — Telehealth: Payer: Self-pay | Admitting: Pharmacist

## 2019-08-09 ENCOUNTER — Telehealth (INDEPENDENT_AMBULATORY_CARE_PROVIDER_SITE_OTHER): Payer: Self-pay | Admitting: Family Medicine

## 2019-08-09 DIAGNOSIS — R112 Nausea with vomiting, unspecified: Secondary | ICD-10-CM

## 2019-08-09 DIAGNOSIS — R109 Unspecified abdominal pain: Secondary | ICD-10-CM

## 2019-08-09 MED ORDER — ONDANSETRON 4 MG PO TBDP: 4.0000 mg | ORAL_TABLET | Freq: Three times a day (TID) | ORAL | 0 refills | Status: DC | PRN

## 2019-08-09 NOTE — Telephone Encounter (Signed)
Patient contacted in follow-up of Ozempic initiation.   Patient was noted to have dermatologic reaction with first dose of Ozempic (semaglutide) - seen and documented by Drs. Neal and C. Anderson on 08/04/2019.  He noted resolution of the dermatologic reaction including itching but notes some continued skin discoloration.   He had no skin reaction with site of second dose.   However, patient has noted and was treated via telemedicine visit today by Dr. Grandville Silos, to have significant nausea and vomiting with weight loss.  The time correlation suggests this is likely Ozempic(semaglutide).    We agreed that Ozempic should be stopped completely (no additional doses) due to the nausea, vomiting reaction with weight loss.   I have added this medication to his intolerance list and discontinued the Ozempic from his medication list.   We discussed the long-acting effect of this medication may take another 2-3 days for the nausea to diminish.    He plans to come tomorrow (12/9) to [facility] clinic for evaluation and additional care plan.  He is scheduled with Dr. Doristine Mango in the morning.  Suggest consideration for an SGLT2 inhibitor as an alternative.

## 2019-08-09 NOTE — Progress Notes (Signed)
[facility] Telemedicine Visit  Patient consented to have virtual visit. Method of visit: Video was attempted, but technology challenges prevented patient from using video, so visit was conducted via telephone.  Encounter participants: Patient: Andrew Huerta - located at home Provider: Bonnita Hollow - located at office Others (if applicable): None  Chief Complaint: Abdominal pain with nausea and vomiting  HPI:  Patient presents had intermittent nausea and vomiting for the past several weeks.  Said this corresponded when he started his Ozempic.  Is also associated with abdominal pain, periumbilical.  Patient denies any vomiting of blood.  Patient states that he also has had decreased appetite and weight loss.  Patient says that when he vomits a lot he also gets abdominal pain and lower back pain.  Lower back pain is chronic.  He has no weakness or numbness.  Patient says he is not been able tolerate much p.o. he has been taking his Protonix for his abdominal pain.  He has not had any diarrhea, fevers, chills, cough, chest pain, shortness of breath.  Patient has had about a 5 pound weight loss over the past 3 weeks.  Patient did go to the ED 2 nights ago for lower back pain.  He left when he got frustrated when the weight was taking too long.  Patient takes Ozempic injections weekly on Wednesdays.  Last injection was  ROS: per HPI  Pertinent PMHx: Hiatal hernia, GERD, Barrett's esophagus, diabetes  Exam:  Respiratory: Able to speak full sentences without issue  Assessment/Plan: Nausea and vomiting and abdominal pain Patient with nausea and vomiting and abdominal pain since starting Ozempic at the end of November.  Concern for medication side effects which include nausea and vomiting, delayed gastric emptying, also low risk for pancreatitis.  Patient does have documented weight loss in his chart by about 5 pounds, patient is also not very large with only 150 pounds.   Discussed going to the ED versus coming into the office.  Patient said he would like to come in tomorrow.  I have prescribed him Zofran to assist with nausea.  I have advised patient not to take his Ozempic dose until we have ruled out this is the cause of his symptoms.  Patient scheduled for ATC appointment tomorrow morning at 9:20.  ED precautions discussed.  Patient will likely need CMP, lipase, CBC.    Time spent during visit with patient: 10 minutes

## 2019-08-10 ENCOUNTER — Emergency Department (HOSPITAL_COMMUNITY): Payer: Self-pay

## 2019-08-10 ENCOUNTER — Other Ambulatory Visit: Payer: Self-pay

## 2019-08-10 ENCOUNTER — Emergency Department (HOSPITAL_COMMUNITY)
Admission: EM | Admit: 2019-08-10 | Discharge: 2019-08-10 | Disposition: A | Discharge status: Home / Self Care | Payer: Self-pay | Attending: Emergency Medicine | Admitting: Emergency Medicine

## 2019-08-10 ENCOUNTER — Ambulatory Visit (INDEPENDENT_AMBULATORY_CARE_PROVIDER_SITE_OTHER): Payer: Self-pay | Admitting: Student in an Organized Health Care Education/Training Program

## 2019-08-10 ENCOUNTER — Encounter (HOSPITAL_COMMUNITY): Payer: Self-pay

## 2019-08-10 VITALS — BP 122/80 | HR 85 | Wt 151.4 lb

## 2019-08-10 DIAGNOSIS — R112 Nausea with vomiting, unspecified: Secondary | ICD-10-CM

## 2019-08-10 DIAGNOSIS — Z79899 Other long term (current) drug therapy: Secondary | ICD-10-CM | POA: Insufficient documentation

## 2019-08-10 DIAGNOSIS — Z7982 Long term (current) use of aspirin: Secondary | ICD-10-CM | POA: Insufficient documentation

## 2019-08-10 DIAGNOSIS — Z7901 Long term (current) use of anticoagulants: Secondary | ICD-10-CM | POA: Insufficient documentation

## 2019-08-10 DIAGNOSIS — E119 Type 2 diabetes mellitus without complications: Secondary | ICD-10-CM

## 2019-08-10 DIAGNOSIS — I10 Essential (primary) hypertension: Secondary | ICD-10-CM | POA: Insufficient documentation

## 2019-08-10 DIAGNOSIS — I2510 Atherosclerotic heart disease of native coronary artery without angina pectoris: Secondary | ICD-10-CM | POA: Insufficient documentation

## 2019-08-10 DIAGNOSIS — R1084 Generalized abdominal pain: Secondary | ICD-10-CM | POA: Insufficient documentation

## 2019-08-10 DIAGNOSIS — Z7984 Long term (current) use of oral hypoglycemic drugs: Secondary | ICD-10-CM | POA: Insufficient documentation

## 2019-08-10 LAB — COMPREHENSIVE METABOLIC PANEL
ALT: 20 U/L (ref 0–44)
AST: 20 U/L (ref 15–41)
Albumin: 4.2 g/dL (ref 3.5–5.0)
Alkaline Phosphatase: 58 U/L (ref 38–126)
Anion gap: 10 (ref 5–15)
BUN: 15 mg/dL (ref 6–20)
CO2: 29 mmol/L (ref 22–32)
Calcium: 10 mg/dL (ref 8.9–10.3)
Chloride: 101 mmol/L (ref 98–111)
Creatinine, Ser: 0.94 mg/dL (ref 0.61–1.24)
GFR calc Af Amer: >60 mL/min (ref >60)
GFR calc non Af Amer: >60 mL/min (ref >60)
Glucose, Bld: 163 mg/dL — ABNORMAL HIGH (ref 70–99)
Potassium: 4.5 mmol/L (ref 3.5–5.1)
Sodium: 140 mmol/L (ref 135–145)
Total Bilirubin: 1.2 mg/dL (ref 0.3–1.2)
Total Protein: 7.4 g/dL (ref 6.5–8.1)

## 2019-08-10 LAB — CBG MONITORING, ED: Glucose-Capillary: 141 mg/dL — ABNORMAL HIGH (ref 70–99)

## 2019-08-10 LAB — CBC
HCT: 42.1 % (ref 39.0–52.0)
Hemoglobin: 14 g/dL (ref 13.0–17.0)
MCH: 30.4 pg (ref 26.0–34.0)
MCHC: 33.3 g/dL (ref 30.0–36.0)
MCV: 91.5 fL (ref 80.0–100.0)
Platelets: 213 10*3/uL (ref 150–400)
RBC: 4.6 MIL/uL (ref 4.22–5.81)
RDW: 12.5 % (ref 11.5–15.5)
WBC: 12 10*3/uL — ABNORMAL HIGH (ref 4.0–10.5)
nRBC: 0 % (ref 0.0–0.2)

## 2019-08-10 LAB — URINALYSIS, ROUTINE W REFLEX MICROSCOPIC
Bacteria, UA: NONE SEEN
Bilirubin Urine: NEGATIVE
Glucose, UA: NEGATIVE mg/dL
Hgb urine dipstick: NEGATIVE
Ketones, ur: 20 mg/dL — AB
Nitrite: NEGATIVE
Protein, ur: 30 mg/dL — AB
Specific Gravity, Urine: 1.024 (ref 1.005–1.030)
pH: 5 (ref 5.0–8.0)

## 2019-08-10 LAB — LIPASE, BLOOD: Lipase: 55 U/L — ABNORMAL HIGH (ref 11–51)

## 2019-08-10 MED ORDER — SODIUM CHLORIDE 0.9 % IV BOLUS
1000.0000 mL | Freq: Once | INTRAVENOUS | Status: AC
  Administered 2019-08-10: 1000 mL via INTRAVENOUS

## 2019-08-10 MED ORDER — IOHEXOL 350 MG/ML SOLN
100.0000 mL | Freq: Once | INTRAVENOUS | Status: AC | PRN
  Administered 2019-08-10: 100 mL via INTRAVENOUS

## 2019-08-10 NOTE — Assessment & Plan Note (Signed)
Latest A1c 8.4, patient endorses taking only 250 milligrams of Metformin daily. Discussed with patient plan to increase dose of Metformin and alter blood sugars with diet and exercise, but that he should not make any increases or changes to his medication while he is acutely ill as this could compound his symptoms.

## 2019-08-10 NOTE — ED Notes (Signed)
Pt ambulated independently to the bathroom 

## 2019-08-10 NOTE — ED Provider Notes (Signed)
[facility] EMERGENCY DEPARTMENT Provider Note   CSN: [id number] Arrival date & time: 08/10/19  1009     History   Chief Complaint Chief Complaint  Patient presents with   Abdominal Pain   Emesis    HPI Andrew Huerta is a 53 y.o. male.     53 year old male presents with complaint of epigastric abdominal pain onset 2 weeks ago after starting Ozempic.  Patient has had nausea and vomiting ever since his first injection 2 weeks ago.  Patient did not have his injection today, was seen by his provider who tried to draw labs to evaluate for possible pancreatitis as result of his medication however they were unable to draw labs in the office and patient was sent to the emergency room for lab work and evaluation.  Patient denies fevers, chills, changes in bowel or bladder habits.  Patient is on Plavix due to heart stents, is taking his Protonix as prescribed.  No other complaints or concerns.     Past Medical History:  Diagnosis Date   Adrenal tumor    a. s/p adrenal gland resection at [facility]. left side removal per patient   Anxiety    Barrett's esophagus    EGD - 11/27/09 - short segment of Barrett's   CAD (coronary artery disease)    a. 04/27/14 [place] s/p DES to mLAD and DES to mLCx   Chronic back pain    upper and lower per patient   Chronic chest pain    Degenerative joint disease    Bilateral knees. Significant knee pain since playing football in high school. also in back per patient   Depression    Diabetes mellitus type 2, controlled ([place]) 08/29/2015   Diagnosed by A1c 7.6% during 08/26/15 hospital admission for chest pain   Gastroesophageal reflux disease with hiatal hernia    GERD (gastroesophageal reflux disease)    Hiatal hernia    EGD - 11/27/2009   Hyperlipidemia    Hypertension    Low back pain    Primary hyperaldosteronism ([place]) 01/22/2012   S/P adrenalectomy      PTSD (post-traumatic stress disorder)    Sleep apnea     Stone, kidney    Syncope 04/10/2019    Patient Active Problem List   Diagnosis Date Noted   Injection site reaction 08/04/2019   Hyperlipidemia LDL goal <70 04/29/2019   Adenoma of right adrenal gland    Rash 02/17/2019   Erectile dysfunction 02/08/2019   Dizziness [id number]   Acute systolic heart failure ([place]) 01/07/2019   Chest pain on exertion 01/03/2019   Noncompliance w/medication treatment due to intermit use of medication 01/03/2019   Diabetic neuropathy ([place]) 06/23/2017   Skin abnormality 05/27/2017   Seborrheic keratoses 05/20/2017   Nevus, non-neoplastic 05/20/2017   Renal cyst, right 11/12/2015   Controlled type 2 diabetes mellitus without complication, without long-term current use of insulin ([place]) 08/29/2015   Chronic back pain 04/27/2015   Chest pain with moderate risk of acute coronary syndrome    New onset seizure ([place]) 09/29/2014   Encounter for chronic pain management 08/09/2014   CAD    Hyperlipidemia    Nephrolithiasis 10/12/2013   Primary hyperaldosteronism ([place]) 09/13/2013   MDD (major depressive disorder) 07/26/2013   Restless legs syndrome 01/05/2013   PTSD (post-traumatic stress disorder) 05/07/2012   Back pain, thoracic 04/14/2012   Non-intractable vomiting 03/08/2012   Gastroesophageal reflux disease with hiatal hernia 02/05/2012   Hypercholesterolemia 02/05/2012   Herniation of nucleus  pulposus 02/05/2012   Apnea, sleep 02/05/2012   Headache(784.0) 11/14/2011   Chest pain 08/14/2011   Enlarged prostate 03/26/2011   Osteoarthrosis involving lower leg 01/08/2010   Essential hypertension 05/02/2008   Barrett's esophagus 10/29/2006    Past Surgical History:  Procedure Laterality Date   ADRENALECTOMY  10/2013   CARDIAC CATHETERIZATION  05/01/2014   Patent stents, other disease unchanged   CARDIAC CATHETERIZATION  04/27/2014   Procedure: CORONARY STENT INTERVENTION;  Surgeon: Peter M Martinique, MD;   Location: [facility] CATH LAB;  Service: Cardiovascular;;  DES mid Cx  DES mid LAD   CARDIAC CATHETERIZATION N/A 03/22/2015   Procedure: Left Heart Cath and Coronary Angiography;  Surgeon: Sherren Mocha, MD;  Location: [place] CV LAB;  Service: Cardiovascular;  Laterality: N/A;   CORONARY ANGIOPLASTY WITH STENT PLACEMENT  04/27/2014   3.0 x 16 mm Promus DES to the mid LAD and 3.5 x 28 mm Promus to the mid LCx, otherwise 20-30 percent lesions, EF 55%   CORONARY STENT INTERVENTION N/A 01/04/2019   Procedure: CORONARY STENT INTERVENTION;  Surgeon: Troy Sine, MD;  Location: [place] CV LAB;  Service: Cardiovascular;  Laterality: N/A;   KIDNEY STONE SURGERY  X 1   KNEE ARTHROPLASTY Right 1984   KNEE ARTHROSCOPY Right X 6   LEFT HEART CATH AND CORONARY ANGIOGRAPHY N/A 01/04/2019   Procedure: LEFT HEART CATH AND CORONARY ANGIOGRAPHY;  Surgeon: Troy Sine, MD;  Location: [place] CV LAB;  Service: Cardiovascular;  Laterality: N/A;   LEFT HEART CATHETERIZATION WITH CORONARY ANGIOGRAM N/A 04/27/2014   Procedure: LEFT HEART CATHETERIZATION WITH CORONARY ANGIOGRAM;  Surgeon: Peter M Martinique, MD;  Location: [facility] C[facility] LAB;  Service: Cardiovascular;  Laterality: N/A;   LEFT HEART CATHETERIZATION WITH CORONARY ANGIOGRAM N/A 05/01/2014   Procedure: LEFT HEART CATHETERIZATION WITH CORONARY ANGIOGRAM;  Surgeon: Burnell Blanks, MD;  Location: [facility] CATH LAB;  Service: Cardiovascular;  Laterality: N/A;   LITHOTRIPSY  X 2        Home Medications    Prior to Admission medications   Medication Sig Start Date End Date Taking? Authorizing Provider  aspirin EC 81 MG tablet Take 81 mg by mouth daily.    [provider]  atorvastatin (LIPITOR) 80 MG tablet TAKE ONE TABLET BY MOUTH DAILY 06/06/19   Sande Rives E, PA-C  carvedilol (COREG) 3.125 MG tablet Take 1 tablet (3.125 mg total) by mouth 2 (two) times daily with a meal. 01/07/19   Diallo, Abdoulaye, MD  clopidogrel (PLAVIX) 75 MG  tablet Take 1 tablet (75 mg total) by mouth daily. 01/06/19   Darreld Mclean, PA-C  HYDROcodone-acetaminophen (NORCO/VICODIN) 5-325 MG tablet Take 1-2 tablets by mouth every 8 (eight) hours as needed for moderate pain. 08/02/19   Zenia Resides, MD  metFORMIN (GLUCOPHAGE) 500 MG tablet Take 1 tablet (500 mg total) by mouth 2 (two) times daily with a meal. Patient taking differently: Take 500 mg by mouth daily with breakfast.  01/14/19   Rory Percy, DO  nitroGLYCERIN (NITROSTAT) 0.4 MG SL tablet Place 1 tablet (0.4 mg total) under the tongue every 5 (five) minutes as needed for chest pain. 01/05/19 01/05/20  Sande Rives E, PA-C  ondansetron (ZOFRAN ODT) 4 MG disintegrating tablet Take 1 tablet (4 mg total) by mouth every 8 (eight) hours as needed for nausea or vomiting. 08/09/19   Bonnita Hollow, MD  ondansetron (ZOFRAN) 4 MG tablet Take 1 tablet (4 mg total) by mouth every 6 (six) hours  as needed for nausea. 04/13/19   Shirley, Martinique, DO  pantoprazole (PROTONIX) 40 MG tablet Take 1 tablet (40 mg total) by mouth daily. 05/27/19   Matilde Haymaker, MD  sertraline (ZOLOFT) 50 MG tablet Take 2 tablets (100 mg total) by mouth daily. 07/27/19   Matilde Haymaker, MD  sildenafil (VIAGRA) 50 MG tablet Take 1 tablet (50 mg total) by mouth as needed for erectile dysfunction. 04/21/19 04/20/20  Matilde Haymaker, MD    Family History Family History  Problem Relation Age of Onset   Cancer Father        stomach cancer   Diabetes Father    Heart attack Paternal Grandfather    Diabetes Paternal Grandmother     Social History Social History   Tobacco Use   Smoking status: Never Smoker   Smokeless tobacco: Never Used  Substance Use Topics   Alcohol use: Yes    Alcohol/week: 0.0 standard drinks    Comment: "seldom" per patient   Drug use: Yes    Types: Marijuana     Allergies   Ozempic (0.25 or 0.5 mg-dose) [semaglutide(0.25 or 0.5mg -dos)], Cortisone acetate, Darvocet [propoxyphene  n-acetaminophen], Nsaids, Xanax [alprazolam], and Tape   Review of Systems Review of Systems  Constitutional: Negative for chills and fever.  Respiratory: Negative for shortness of breath.   Cardiovascular: Negative for chest pain.  Gastrointestinal: Positive for abdominal pain, nausea and vomiting. Negative for constipation and diarrhea.  Genitourinary: Negative for difficulty urinating.  Musculoskeletal: Negative for arthralgias and myalgias.  Skin: Negative for rash and wound.  Allergic/Immunologic: Negative for immunocompromised state.  Neurological: Negative for weakness.  Hematological: Bruises/bleeds easily.  Psychiatric/Behavioral: Negative for confusion.  All other systems reviewed and are negative.    Physical Exam Updated Vital Signs BP 110/82    Pulse 77    Temp (!) 97.5 F (36.4 C) (Oral)    Resp 13    Ht 5\' 10"  (1.778 m)    Wt 68.7 kg    SpO2 98%    BMI 21.73 kg/m   Physical Exam Vitals signs and nursing note reviewed.  Constitutional:      General: He is not in acute distress.    Appearance: He is well-developed. He is not diaphoretic.  HENT:     Head: Normocephalic and atraumatic.  Cardiovascular:     Rate and Rhythm: Normal rate and regular rhythm.     Heart sounds: Normal heart sounds.  Pulmonary:     Effort: Pulmonary effort is normal.     Breath sounds: Normal breath sounds.  Abdominal:     Palpations: Abdomen is soft.     Tenderness: There is generalized abdominal tenderness and tenderness in the epigastric area. There is no right CVA tenderness or left CVA tenderness.  Skin:    General: Skin is warm and dry.     Findings: No rash.  Neurological:     Mental Status: He is alert and oriented to person, place, and time.  Psychiatric:        Behavior: Behavior normal.      ED Treatments / Results  Labs (all labs ordered are listed, but only abnormal results are displayed) Labs Reviewed  LIPASE, BLOOD - Abnormal; Notable for the following  components:      Result Value   Lipase 55 (*)    All other components within normal limits  COMPREHENSIVE METABOLIC PANEL - Abnormal; Notable for the following components:   Glucose, Bld 163 (*)    All other components  within normal limits  CBC - Abnormal; Notable for the following components:   WBC 12.0 (*)    All other components within normal limits  URINALYSIS, ROUTINE W REFLEX MICROSCOPIC - Abnormal; Notable for the following components:   Ketones, ur 20 (*)    Protein, ur 30 (*)    Leukocytes,Ua TRACE (*)    All other components within normal limits  CBG MONITORING, ED - Abnormal; Notable for the following components:   Glucose-Capillary 141 (*)    All other components within normal limits    EKG None  Radiology Ct Abdomen Pelvis W Contrast  Result Date: 08/10/2019 CLINICAL DATA:  Acute generalized abdominal pain EXAM: CT ABDOMEN AND PELVIS WITH CONTRAST TECHNIQUE: Multidetector CT imaging of the abdomen and pelvis was performed using the standard protocol following bolus administration of intravenous contrast. CONTRAST:  168mL OMNIPAQUE IOHEXOL 350 MG/ML SOLN COMPARISON:  MR abdomen April 12, 2019, CT Jan 12, 2015 FINDINGS: Lower chest: Lung bases are clear. Normal heart size. No pericardial effusion. Hepatobiliary: No focal hepatic lesions. Diffuse hepatic hypoattenuation with some regional sparing likely reflecting diffuse hepatic steatosis. Few calcified gallstones layering within the otherwise normal gallbladder. No biliary ductal dilatation. Pancreas: Unremarkable. No pancreatic ductal dilatation or surrounding inflammatory changes. Spleen: Normal in size without focal abnormality. Adrenals/Urinary Tract: Right adrenal gland nodules, similar to comparison MRI, previously characterized as benign adenomas. No new or concerning adrenal lesions. Intrarenal calculi in the left kidney are similar to comparison CT from 2016. Fluid attenuation cyst is seen in the interpolar posterior  of the right kidney. Right extrarenal pelvis is noted. Right renal vascular calcifications are present. No worrisome renal lesions. No obstructive urolithiasis or hydronephrosis. Mild bladder wall thickening is present. Mild indentation of the bladder base by the enlarged prostate. No bladder calculi or debris. Stomach/Bowel: Distal esophagus, stomach and duodenal sweep are unremarkable. No small bowel wall thickening or dilatation. No evidence of obstruction. A normal appendix is visualized. No colonic dilatation or wall thickening. Vascular/Lymphatic: Atherosclerotic plaque within the normal caliber aorta. Additional calcifications in the branch vessels. No aneurysm or ectasia. No suspicious or enlarged lymph nodes in the included lymphatic chains. Reproductive: Borderline prostatomegaly with coarse eccentric prosthetic calcifications. No worrisome prostatic or seminal vesicular lesions. Other: Fat containing inguinal hernias. No bowel containing hernia. No free fluid or free air seen in the abdomen or pelvis Musculoskeletal: Multilevel degenerative changes are present in the imaged portions of the spine. Additional degenerative changes in the hips are mild. Likely physiologic wedging of the vertebrae at the thoracolumbar junction, similar to comparison CT. IMPRESSION: 1. Mild bladder wall thickening with indentation of the bladder base by a borderline enlarged prostate. Could reflect sequela of outlet obstruction though could consider urinalysis to exclude cystitis. 2. Hepatic steatosis. 3. Cholelithiasis without evidence of cholecystitis. 4. Right adrenal gland nodules, similar to comparison MRI, previously characterized as benign adenomas. 5. Aortic Atherosclerosis (ICD10-I70.0). Electronically Signed   By: Lovena Le M.D.   On: 08/10/2019 15:04    Procedures Procedures (including critical care time)  Medications Ordered in ED Medications  sodium chloride 0.9 % bolus 1,000 mL (0 mLs Intravenous  Stopped 08/10/19 1418)  iohexol (OMNIPAQUE) 350 MG/ML injection 100 mL (100 mLs Intravenous Contrast Given 08/10/19 1453)     Initial Impression / Assessment and Plan / ED Course  I have reviewed the triage vital signs and the nursing notes.  Pertinent labs & imaging results that were available during my care of the patient were reviewed  by me and considered in my medical decision making (see chart for details).  Clinical Course as of Aug 09 1604  Wed Aug 10, 3143  [id number] 53 year old male with abdominal pain after starting Ozempic injections 2 weeks ago.  On exam has mild epigastric and generalized abdominal tenderness.  CBC with mild leukocytosis at 12,000, CMP within normal limits with exception of glucose of 163.  Patient's lipase is 55, not consistent with acute pancreatitis, urinalysis is positive for ketones and protein likely due to his vomiting.  CT scan is without acute findings.  EKG without acute ischemic changes.  Patient was given copies of his labs to take to his provider for follow-up, advised return to ER for new or worsening symptoms.   [LM]    Clinical Course User Index [LM] Tacy Learn, PA-C      Final Clinical Impressions(s) / ED Diagnoses   Final diagnoses:  Generalized abdominal pain    ED Discharge Orders    None       Tacy Learn, PA-C 08/10/19 1605    Daleen Bo, MD 08/13/19 [phone]

## 2019-08-10 NOTE — Patient Instructions (Addendum)
It was a pleasure to see you today!  To summarize our discussion for this visit:  I am sorry to hear that you are not feeling well.  If your symptoms are from the medication I would expect them to be improving in the next couple days as the medication gets out of your system.  We will check your blood work today to assess the vomiting and abdominal pain.  Since you already have plans and work with the GI doctor we will continue to follow-up with that avenue of diagnosis.  Please continue to take your Zofran and to help resolve as needed.  For your blood sugars, we can attempt to increase your metformin dose but lets wait to do that until you are feeling better so that we do not compound your symptoms.  Some additional health maintenance measures we should update are: Health Maintenance Due  Topic Date Due  . COLONOSCOPY  04/16/2016  . TETANUS/TDAP  07/02/2016  . OPHTHALMOLOGY EXAM  09/19/2016  . FOOT EXAM  06/23/2018  . URINE MICROALBUMIN  10/02/2019  .    Call the clinic at [phone] if your symptoms worsen or you have any concerns.   Thank you for allowing me to take part in your care,  Dr. Doristine Mango   Medical coverage options  Medicaid  - local Department of social Services on online applications  DEPARTMENT OF SOCIAL SERVICES: [address] [place], [place] [address] [place] [email or web address]   Call [phone]   [place] [employer]   [email or web address]   [phone]    [place] Card- Pick up application at the front desk  Who Does the [place]? The [facility] serves any patient with an income between 0%-200% of the federal poverty level.  [facility] patients must be exempt from the [place] in order to enroll into the [facility].   Medication Assistance  The [facility] Department of Public Health's Medication Assistance Program (MAP) may be able to help. Who is eligible for MAP services?  Marland Kitchen Adult  [facility] residents who have incomes at or below 200% of the [employer] Poverty Scale ($21,660 if single, $29,140 if married). MAP offices are located at the [place]:  . [address], [place], [place], [place]. [place], [id number], [place], [place].  [phone]

## 2019-08-10 NOTE — ED Notes (Signed)
Pt verbalized understanding of discharge instructions. Follow up care reviewed, pt had no further questions at this time.

## 2019-08-10 NOTE — Discharge Instructions (Addendum)
Your labs today are reassuring, copies to be provided to take back to your provider for follow-up.

## 2019-08-10 NOTE — Progress Notes (Addendum)
   Subjective:    Patient ID: Andrew Huerta, male    DOB: April 26, 1966, 53 y.o.   MRN: [id number]  CC: Continued abdominal pain and nausea  HPI:  Patient was started on Ozempic less than a month ago and received 2 injections with the last one being 1 week from today.  Patient was not able to tolerate the medication as he was having an injection site reaction, nausea, vomiting and epigastric pain which are intractable.  Denies any blood in vomit or stool.  Having increased gas and loose stools associated with decreased appetite.  Has not been able to tolerate very much solid foods or liquids. Epigastric pain is consistent and radiates to his back and interfering with his activities of daily living.  Patient takes pantoprazole chronically and has a history of Barrett's esophagus and hiatal hernia. Patient has already been contacted by GI to schedule endoscopy and colonoscopy however, they are consulting with cardiology to assess whether patient can be off of Plavix for 5 days prior to procedure safely in order to obtain a biopsy and decreased bleeding risk. Patient actively vomiting while in lab.  Diabetes-patient takes 250 mg of Metformin daily.  Smoking status reviewed   ROS: pertinent noted in the HPI   I have personally reviewed pertinent past medical history, surgical, family, and social history as appropriate. Objective:  BP 122/80   Pulse 85   Wt 151 lb 6.4 oz (68.7 kg)   SpO2 97%   BMI 23.71 kg/m   Vitals and nursing note reviewed  General: NAD, pleasant, able to participate in exam Extremities: no edema or cyanosis. Skin: warm and dry, previous ecchymosis from injection site is improved and there is no reaction to second injection site.  Abdomen is tender to palpation in epigastric area.  Active bowel sounds diffusely. Neuro: alert, no obvious focal deficits Psych: Normal affect and mood  Assessment & Plan:   Non-intractable vomiting Patient has Zofran which improved  symptoms.  Also speaking to GI for scheduling endoscopy and colonoscopy. Differential includes pancreatitis, no history of pancreatitis. Could be continued symptoms from Ozempic which should improve spontaneously without readministration. Also consider chronic symptomatology of severe GERD with hiatal hernia or H. pylori infection which will be addressed by GI. Attempted to obtain CMP, lipase and CBC today as well to assess nutritional status, infectious source or other. Unable to obtain labs 2/2 severe dehydration and vomiting during lab encounter. Patient was advised to go to ED. Workup was largely negative for acute source including abdominal CT and lipase. Mild leukocytosis.  Discharged home with recommendation to f/u with PCP  Controlled type 2 diabetes mellitus without complication, without long-term current use of insulin (HCC) Latest A1c 8.4, patient endorses taking only 250 milligrams of Metformin daily. Discussed with patient plan to increase dose of Metformin and alter blood sugars with diet and exercise, but that he should not make any increases or changes to his medication while he is acutely ill as this could compound his symptoms.   Doristine Mango, [place] [facility] PGY-2

## 2019-08-10 NOTE — ED Triage Notes (Signed)
Pt arrives POV for eval of generalized abd pain, N/V and malaise x 1 week. Pt reports he has been taking shots for his diabetes in his stomach, and they recently had him stop taking them d/t the side effects of N/V. Pt reports his most recent A1C was 8.1. States he just takes metformin and this new abd shot that they advised him to stop. F/u w/ PCP today but they were unable to get bloodwork.

## 2019-08-10 NOTE — Assessment & Plan Note (Addendum)
Patient has Zofran which improved symptoms.  Also speaking to GI for scheduling endoscopy and colonoscopy. Differential includes pancreatitis, no history of pancreatitis. Could be continued symptoms from Ozempic which should improve spontaneously without readministration. Also consider chronic symptomatology of severe GERD with hiatal hernia or H. pylori infection which will be addressed by GI. Attempted to obtain CMP, lipase and CBC today as well to assess nutritional status, infectious source or other. Unable to obtain labs 2/2 severe dehydration and vomiting during lab encounter. Patient was advised to go to ED. Workup was largely negative for acute source including abdominal CT and lipase. Mild leukocytosis.  Discharged home with recommendation to f/u with PCP

## 2019-08-11 ENCOUNTER — Telehealth: Payer: Self-pay

## 2019-08-11 NOTE — Telephone Encounter (Signed)
08/11/2019 Spoke with patient to see if he had any other needs.  He was given information about insurance options on 08/10/2019.  Patient stated he had no other needs but will call if he does. Ambrose Mantle [phone]

## 2019-08-12 NOTE — Addendum Note (Signed)
Addended by: Richarda Osmond on: 08/12/2019 01:01 PM   Modules accepted: Orders

## 2019-08-24 ENCOUNTER — Ambulatory Visit: Payer: Self-pay | Admitting: Family Medicine

## 2019-08-31 ENCOUNTER — Other Ambulatory Visit: Payer: Self-pay | Admitting: Family Medicine

## 2019-08-31 DIAGNOSIS — G894 Chronic pain syndrome: Secondary | ICD-10-CM

## 2019-09-05 MED ORDER — HYDROCODONE-ACETAMINOPHEN 5-325 MG PO TABS: 1.0000 | ORAL_TABLET | Freq: Three times a day (TID) | ORAL | 0 refills | Status: DC | PRN

## 2019-09-05 NOTE — Telephone Encounter (Signed)
Pt calling to check status. Andrew Huerta, CMA  

## 2019-09-09 ENCOUNTER — Telehealth: Payer: Self-pay | Admitting: *Deleted

## 2019-09-09 NOTE — Telephone Encounter (Signed)
   [facility] Pre-operative Risk Assessment    Request for surgical clearance:  1. What type of surgery is being performed? COLONOSCOPY/ ENDOSCOPY  2. When is this surgery scheduled?  10/05/19   3. What type of clearance is required (medical clearance vs. Pharmacy clearance to hold med vs. Both)? BOTH  4. Are there any medications that need to be held prior to surgery and how long? PLAVIX   5. Practice name and name of physician performing surgery?  [facility]   6. What is your office phone number [phone]    7.   What is your office fax number [phone]  8.   Anesthesia type (None, local, MAC, general) ? PROPOFOL   Devra Dopp 09/09/2019, 10:23 AM  _________________________________________________________________   (provider comments below)

## 2019-09-09 NOTE — Telephone Encounter (Signed)
Spoke with Dr. Erlinda Hong nurse, Amy. She stated it is a routine colonoscopy. I informed her of need to wait 1 year after stent placement to hold Plavix. Informed that stent was placed in May 2020, so could reschedule colonoscoy in or after May 2021. Amy stated she will let Dr. Paulita Fujita know and call us back if he thinks it id medically necessary for pt to have the colonoscopy now or if ok to wait.

## 2019-09-09 NOTE — Telephone Encounter (Signed)
   Primary Cardiologist: Peter Martinique, MD  Chart reviewed as part of pre-operative protocol coverage. Patient has a history of CAD s/p multiple interventions, most recently with PCI/DES to LAD 12/2018. He was recommended for uninterrupted DAPT for 1 year.  Callback: - Please contact the requesting surgeons office to clarify the urgency of the colonoscopy. If routine study, ideally would wait until at least 1 year after PCI.   Abigail Butts, PA-C 09/09/2019, 10:34 AM

## 2019-09-15 NOTE — Telephone Encounter (Signed)
Can we follow-up on this? If OK to wait until after May 2021, can we ask requesting office to refax pre-op risk assessment form closer to time of procedure when date has been set?  Thank you!

## 2019-09-15 NOTE — Telephone Encounter (Signed)
Thank you! I will remove from pre-op pool for now and we will wait for them to send new request form once procedure has been rescheduled.

## 2019-09-15 NOTE — Telephone Encounter (Signed)
I s/w Andrew Huerta at Dr. Erlinda Hong office and asked to s/w Amy in regards to conversation last week. Andrew Huerta s/w Amy who states they will hold off on procedure until after May which they will reschedule at that time with the pt. I thanked Andrew Huerta for her help. I will let our pre op team know of the discission.

## 2019-09-20 ENCOUNTER — Other Ambulatory Visit: Payer: Self-pay | Admitting: Family Medicine
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
## 2019-10-04 ENCOUNTER — Other Ambulatory Visit: Payer: Self-pay | Admitting: Family Medicine

## 2019-10-04 DIAGNOSIS — G894 Chronic pain syndrome: Secondary | ICD-10-CM

## 2019-10-06 ENCOUNTER — Ambulatory Visit: Payer: Self-pay | Admitting: Cardiology

## 2019-10-06 MED ORDER — HYDROCODONE-ACETAMINOPHEN 5-325 MG PO TABS: 1.0000 | ORAL_TABLET | Freq: Three times a day (TID) | ORAL | 0 refills | Status: DC | PRN

## 2019-10-31 ENCOUNTER — Other Ambulatory Visit: Payer: Self-pay | Admitting: Family Medicine

## 2019-10-31 ENCOUNTER — Telehealth: Payer: Self-pay | Admitting: Family Medicine

## 2019-10-31 DIAGNOSIS — G894 Chronic pain syndrome: Secondary | ICD-10-CM

## 2019-11-04 NOTE — Telephone Encounter (Signed)
Patient calls nurse line stating he still has not heard from his pharmacy about recent Hydrocodone rx he requested on Monday. Patient stated it was not due to be filled until 3/4, however today is 3/5. Per chart review, looks like [facility] started to order the medication, however did not, unsure why. Will forward to covering provider.

## 2019-11-07 ENCOUNTER — Other Ambulatory Visit: Payer: Self-pay | Admitting: Family Medicine

## 2019-11-07 DIAGNOSIS — G894 Chronic pain syndrome: Secondary | ICD-10-CM

## 2019-11-07 MED ORDER — HYDROCODONE-ACETAMINOPHEN 5-325 MG PO TABS: 1.0000 | ORAL_TABLET | Freq: Three times a day (TID) | ORAL | 0 refills | Status: DC | PRN

## 2019-11-07 NOTE — Telephone Encounter (Signed)
Prescription filled.

## 2019-12-06 ENCOUNTER — Encounter: Payer: Self-pay | Admitting: Family Medicine

## 2019-12-06 LAB — HM DIABETES EYE EXAM

## 2019-12-12 ENCOUNTER — Encounter: Payer: Self-pay | Admitting: Family Medicine

## 2019-12-12 ENCOUNTER — Ambulatory Visit (INDEPENDENT_AMBULATORY_CARE_PROVIDER_SITE_OTHER): Payer: Self-pay | Admitting: Family Medicine

## 2019-12-12 ENCOUNTER — Other Ambulatory Visit: Payer: Self-pay

## 2019-12-12 VITALS — BP 114/64 | HR 70 | Ht 67.0 in | Wt 153.0 lb

## 2019-12-12 DIAGNOSIS — N521 Erectile dysfunction due to diseases classified elsewhere: Secondary | ICD-10-CM

## 2019-12-12 DIAGNOSIS — E119 Type 2 diabetes mellitus without complications: Secondary | ICD-10-CM

## 2019-12-12 DIAGNOSIS — M7918 Myalgia, other site: Secondary | ICD-10-CM

## 2019-12-12 DIAGNOSIS — F332 Major depressive disorder, recurrent severe without psychotic features: Secondary | ICD-10-CM

## 2019-12-12 LAB — POCT GLYCOSYLATED HEMOGLOBIN (HGB A1C): HbA1c, POC (controlled diabetic range): 9.2 % — AB (ref 0.0–7.0)

## 2019-12-12 MED ORDER — DICLOFENAC SODIUM 1 % EX GEL: 2.0000 g | Freq: Four times a day (QID) | CUTANEOUS | 2 refills | Status: DC

## 2019-12-12 MED ORDER — SILDENAFIL CITRATE 50 MG PO TABS: 50.0000 mg | ORAL_TABLET | ORAL | 3 refills | Status: DC | PRN

## 2019-12-12 MED ORDER — METFORMIN HCL ER 500 MG PO TB24: 500.0000 mg | ORAL_TABLET | Freq: Every day | ORAL | 0 refills | Status: DC

## 2019-12-12 NOTE — Patient Instructions (Signed)
Depression: It seems like this is doing much better compared to your previous visits.  I am glad.  Please continue taking your medication daily.  Back pain: This new back pain seems to be muscular.  I recommend using the Voltaren gel and continue using heat pads at home as you have been doing.  You can also do these low back exercises I have provided for some muscle strengthening.  Diabetes: Start taking this extended release version of the Metformin in the morning.  If he can take this for 1 week without significant side effects, start taking it once in the morning and once in the evening.  Please come back to clinic in 3 months.

## 2019-12-12 NOTE — Assessment & Plan Note (Signed)
His mood in clinic today seems significantly improved compared to previous visits.  Has objective PHQ-9 score was much better without any suicidal ideation. -Continue sertraline daily

## 2019-12-12 NOTE — Assessment & Plan Note (Addendum)
A1c increased to 9.2 today from previous 8.4.  Likely due to nutrition and inability to tolerate Metformin.  We will start Metformin extended release today to see if GI side effects are better tolerated. -Metformin 500 mg daily for 1 week.  Increase to 500 mg twice daily if well tolerated after 1 week. -Foot exam performed today -Return to clinic in 3 months -Not able to provide urine sample today.  Will attempt again at subsequent visit.

## 2019-12-12 NOTE — Assessment & Plan Note (Addendum)
Is a history of significant degenerative disease of his spine.  History and physical exam are more consistent with an acute MSK injury.  He is not able to take NSAIDs.  Continue with current pain medication (Norco) and we will add Voltaren gel for some relief.  Continue to use heating pads as needed. -Return to clinic if no significant improvement in the next 2 weeks

## 2019-12-12 NOTE — Progress Notes (Signed)
    SUBJECTIVE:   CHIEF COMPLAINT / HPI Diabetes Currently taking Metformin 250 mg in the morning.  He is reduce this dose to half a Metformin tablet due to his poor ability to tolerate the GI side effects.  He has not previously tried the extended release formulation of Metformin.  In addition to his inability to tolerate Metformin, a has noted some significant dietary indiscretions.  Likely in part due to his position working at [employer].  Back pain, new He reports that he thinks that he may have pulled a muscle in his back about 2 weeks ago when he was required to move heavy pallets around at work.  Since then, he has experienced an achy sensation in his left lower back.  He does occasionally have shooting pains down his leg and some mild pain with ambulation.  No fecal or urinary incontinence.  Overall, seems to be mildly improving.  He believes this is a musculoskeletal injury but wants to be sure.  MDD Subjectively improving.  He has begun dating again.  This is a new development since the death of his wife last year.  He believes that he is starting to feel better and slightly more in control of his life.  He continues to take sertraline 50 mg daily.  PERTINENT  PMH / PSH: Diabetes, chronic pain of his back and lower extremities bilaterally, major depressive disorder, Barrett's esophagus  OBJECTIVE:   BP 114/64   Pulse 70   Ht 5\' 7"  (1.702 m)   Wt 153 lb (69.4 kg)   SpO2 97%   BMI 23.96 kg/m    General: Alert and cooperative and appears to be in no acute distress.  Able to walk around the exam room although with a mild, chronic limp.  Able to pull himself up on the exam table. Back: Mild tenderness with palpation of the lumbar vertebrae.  Mild tenderness with palpation of the paraspinal muscles on the left side.  Negative straight leg raise bilaterally.  ASSESSMENT/PLAN:   Controlled type 2 diabetes mellitus without complication, without long-term current use of insulin  (HCC) A1c increased to 9.2 today from previous 8.4.  Likely due to nutrition and inability to tolerate Metformin.  We will start Metformin extended release today to see if GI side effects are better tolerated. -Metformin 500 mg daily for 1 week.  Increase to 500 mg twice daily if well tolerated after 1 week. -Foot exam performed today -Return to clinic in 3 months -Not able to provide urine sample today.  Will attempt again at subsequent visit.  Musculoskeletal pain Is a history of significant degenerative disease of his spine.  History and physical exam are more consistent with an acute MSK injury.  He is not able to take NSAIDs.  Continue with current pain medication (Norco) and we will add Voltaren gel for some relief.  Continue to use heating pads as needed. -Return to clinic if no significant improvement in the next 2 weeks  MDD (major depressive disorder) His mood in clinic today seems significantly improved compared to previous visits.  Has objective PHQ-9 score was much better without any suicidal ideation. -Continue sertraline daily     Matilde Haymaker, MD [place]

## 2019-12-13 ENCOUNTER — Other Ambulatory Visit: Payer: Self-pay

## 2019-12-13 DIAGNOSIS — G894 Chronic pain syndrome: Secondary | ICD-10-CM

## 2019-12-13 MED ORDER — HYDROCODONE-ACETAMINOPHEN 5-325 MG PO TABS: 1.0000 | ORAL_TABLET | Freq: Three times a day (TID) | ORAL | 0 refills | Status: DC | PRN

## 2019-12-13 NOTE — Telephone Encounter (Signed)
Patient calls nurse line regarding rx refill for hydrocodone/acetaminophen 5-325 mg tablets. Patient was seen in office yesterday. Patient states that this was the primary reason he came in for office visit and would like rx refilled as soon as possible.   To PCP  Please advise  Talbot Grumbling, RN

## 2019-12-28 ENCOUNTER — Other Ambulatory Visit: Payer: Self-pay | Admitting: Family Medicine

## 2019-12-29 ENCOUNTER — Other Ambulatory Visit: Payer: Self-pay | Admitting: Family Medicine

## 2019-12-31 ENCOUNTER — Other Ambulatory Visit: Payer: Self-pay | Admitting: Family Medicine

## 2020-01-03 ENCOUNTER — Telehealth: Payer: Self-pay | Admitting: Cardiology

## 2020-01-03 NOTE — Telephone Encounter (Signed)
   [facility] Pre-operative Risk Assessment    Request for surgical clearance:  1. What type of surgery is being performed? Colonoscopy/Endoscopy   2. When is this surgery scheduled? TBD~June 2021   3. What type of clearance is required (medical clearance vs. Pharmacy clearance to hold med vs. Both)? Pharmacy  4. Are there any medications that need to be held prior to surgery and how long? Requesting to hold Plavix for 5 days prior    5. Practice name and name of physician performing surgery? [facility]~Dr. Therisa Doyne  6. What is your office phone number [phone]    7.   What is your office fax number [phone]  8.   Anesthesia type (None, local, MAC, general) ? Propofol   Andrew Huerta Andrew Huerta 01/03/2020, 10:34 AM  _________________________________________________________________   (provider comments below)

## 2020-01-03 NOTE — Telephone Encounter (Signed)
   Primary Cardiologist: Peter Martinique, MD  Chart reviewed as part of pre-operative protocol coverage. Given past medical history and time since last visit, based on ACC/AHA guidelines, Andrew Huerta would be at acceptable risk for the planned procedure without further cardiovascular testing.   I have spoken to the patient by phone. He can hold the Plavix for 5-7 days prior to the procedure. He indicates that it is scheduled for 02/20/2020.  He may hold the Plavix on 02/15/2020. He verbalizes understanding.   I will route this recommendation to the requesting party via Epic fax function and remove from pre-op pool.  Please call with questions.  Phill Myron. Kierstan Auer DNP, ANP, [place]  01/03/2020, 11:18 AM

## 2020-01-03 NOTE — Telephone Encounter (Signed)
He can hold Plavix for procedures  Tanaia Hawkey Martinique MD, [facility]

## 2020-01-10 ENCOUNTER — Other Ambulatory Visit: Payer: Self-pay

## 2020-01-10 DIAGNOSIS — N521 Erectile dysfunction due to diseases classified elsewhere: Secondary | ICD-10-CM

## 2020-01-10 DIAGNOSIS — G894 Chronic pain syndrome: Secondary | ICD-10-CM

## 2020-01-12 MED ORDER — SILDENAFIL CITRATE 50 MG PO TABS: 50.0000 mg | ORAL_TABLET | ORAL | 3 refills | Status: DC | PRN

## 2020-01-12 MED ORDER — HYDROCODONE-ACETAMINOPHEN 5-325 MG PO TABS: 1.0000 | ORAL_TABLET | Freq: Three times a day (TID) | ORAL | 0 refills | Status: DC | PRN

## 2020-01-27 ENCOUNTER — Other Ambulatory Visit: Payer: Self-pay | Admitting: Student

## 2020-01-30 ENCOUNTER — Other Ambulatory Visit: Payer: Self-pay | Admitting: Student

## 2020-02-10 ENCOUNTER — Other Ambulatory Visit: Payer: Self-pay | Admitting: Student

## 2020-02-10 ENCOUNTER — Other Ambulatory Visit: Payer: Self-pay

## 2020-02-10 DIAGNOSIS — G894 Chronic pain syndrome: Secondary | ICD-10-CM

## 2020-02-10 DIAGNOSIS — N521 Erectile dysfunction due to diseases classified elsewhere: Secondary | ICD-10-CM

## 2020-02-10 MED ORDER — SILDENAFIL CITRATE 50 MG PO TABS: 50.0000 mg | ORAL_TABLET | ORAL | 3 refills | Status: DC | PRN

## 2020-02-10 MED ORDER — HYDROCODONE-ACETAMINOPHEN 5-325 MG PO TABS: 1.0000 | ORAL_TABLET | Freq: Three times a day (TID) | ORAL | 0 refills | Status: DC | PRN

## 2020-02-10 NOTE — Telephone Encounter (Signed)
This is Dr. Jordan's pt. °

## 2020-02-22 ENCOUNTER — Ambulatory Visit (INDEPENDENT_AMBULATORY_CARE_PROVIDER_SITE_OTHER): Payer: Self-pay | Admitting: Family Medicine

## 2020-02-22 ENCOUNTER — Other Ambulatory Visit: Payer: Self-pay

## 2020-02-22 ENCOUNTER — Telehealth: Payer: Self-pay | Admitting: Family Medicine

## 2020-02-22 ENCOUNTER — Telehealth: Payer: Self-pay

## 2020-02-22 VITALS — BP 128/82 | HR 83 | Ht 67.0 in | Wt 153.2 lb

## 2020-02-22 DIAGNOSIS — R569 Unspecified convulsions: Secondary | ICD-10-CM

## 2020-02-22 NOTE — Assessment & Plan Note (Signed)
Patient presenting today with new onset seizure-like activity.  All 4 episodes have been witnessed.  Last episode was witnessed by healthcare provider, girlfriend is a Software engineer.  Does not have incontinence during episode but has total body shaking for at least 2 minutes.  Does not remember the events.  In the past has been seen by Dr. Si Raider, neurology on 10/04/2014.  Had an MRI done at [place] with presumed chronic small vessel ischemic white matter changes.  Was diagnosed with syncope versus seizure with an emotional event trigger.  EEG at that time was normal in awake state.  Since then he had no events until this past week.  Given that patient is having new onset seizure-like activity I think he needs to be evaluated and fairly quickly.  Differentials include syncope versus seizure.  If new onset seizure will need to rule out brain mass.  Patient does not have health insurance and does not qualify for orange card.  Is currently working with [place] to try and get Medicaid.  Given that patient has financial difficulties we will not order imaging at this time as neurology will likely reorder this.  Have placed urgent referral to neurology and was able to call and get an appointment for patient in 2 days.  Patient advised of the importance of going to this appointment and he reports that he will go.  Will contact his employer to see if he needs a doctor's note, if he does I am happy to write this.  I advised to avoid driving until cleared by neurology.  Will get CBC and CMP today.  Will not get EKG as patient is not currently having any episodes will likely be unrevealing so we will save cost by not obtained today.  No new medications or medication changes that would lower seizure threshold.  Strict return precautions given.  Follow-up scheduled for patient in 1 week.  At that visit please confirm that patient has been compliant with following up with neurology and follow-up any work-up that is  needed.

## 2020-02-22 NOTE — Progress Notes (Signed)
SUBJECTIVE:   CHIEF COMPLAINT / HPI:   Seizure like activity  Patient presenting today for concern of seizures.  Per chart review has had a history of seizure x1.  Patient reports that in the last week to 10 days he has had episodes where he feels dizzy and has blurry vision and feels like he will pass out. Happens 4x in last 10 days. Seems to only happen when he eats. As he is eating he starts feeling "bad". Yesterday he was eating lunch with girlfriend at a [employer]. He was eating chips when he had blurry vision. He leaned over and then dropped and then started shaking. This was witnessed by girlfriend but he does not remember the event. Patient's girlfriend reports he was shaking for at least 2 minutes. Did not have incontinence but had urge to defecate right after. Girlfriend is a Software engineer and suggested he go to the hospital but he refused at that time.   States he has had lots of problems with left side of neck, painful. Also headaches on occipit. Has had nausea, vomiting, and diarrhea. Had episode of emesis and diarrhea right after waking up. No fevers but did wake up sweating. No new medications. Of note did take sildenafil prior to event, but states other episodes happened at work when he did not take sildenafil. Does take norco, took 1 yesterday. No drug use. Occasional alcohol use, maybe 1x/month. No tobacco or vaping use.   No known chest pain or SOB. No palpitations. Reports dizziness as "everything goes black". Nothing is spinning. As if lights go out.   PERTINENT  PMH / PSH: h/o seizure, HLD, PTSD, chronic pain  OBJECTIVE:   BP 128/82    Pulse 83    Ht 5\' 7"  (1.702 m)    Wt 153 lb 3.2 oz (69.5 kg)    SpO2 98%    BMI 23.99 kg/m   Gen: awake and alert, NAD HEENT: moist mucous membranes, EOMI, PERRL, uvula midline  Cardio: RRR, no MRG, 2+ pulses Resp: CTAB, no wheezes, rales or rhonchi GI: soft, non tender, non distended, bowel sounds present Ext: no edema, non  tender, 5/5 strength, normal grip strength  Neuro: CN2-12 intact, sensation intact, no finger to nose dysmetria, normal heel to shin, normal rapid alternating movements, normal giat   ASSESSMENT/PLAN:   Seizure-like activity [facility]) Patient presenting today with new onset seizure-like activity.  All 4 episodes have been witnessed.  Last episode was witnessed by healthcare provider, girlfriend is a Software engineer.  Does not have incontinence during episode but has total body shaking for at least 2 minutes.  Does not remember the events.  In the past has been seen by Dr. Si Raider, neurology on 10/04/2014.  Had an MRI done at [place] with presumed chronic small vessel ischemic white matter changes.  Was diagnosed with syncope versus seizure with an emotional event trigger.  EEG at that time was normal in awake state.  Since then he had no events until this past week.  Given that patient is having new onset seizure-like activity I think he needs to be evaluated and fairly quickly.  Differentials include syncope versus seizure.  If new onset seizure will need to rule out brain mass.  Patient does not have health insurance and does not qualify for orange card.  Is currently working with [place] to try and get Medicaid.  Given that patient has financial difficulties we will not order imaging at this time as neurology will  likely reorder this.  Have placed urgent referral to neurology and was able to call and get an appointment for patient in 2 days.  Patient advised of the importance of going to this appointment and he reports that he will go.  Will contact his employer to see if he needs a doctor's note, if he does I am happy to write this.  I advised to avoid driving until cleared by neurology.  Will get CBC and CMP today.  Will not get EKG as patient is not currently having any episodes will likely be unrevealing so we will save cost by not obtained today.  No new medications or medication changes that would  lower seizure threshold.  Strict return precautions given.  Follow-up scheduled for patient in 1 week.  At that visit please confirm that patient has been compliant with following up with neurology and follow-up any work-up that is needed.   Discussed with Dr. Vertell Limber, [place]

## 2020-02-22 NOTE — Telephone Encounter (Signed)
Patient calls nurse line stating he was seen today for seizures. Patient reports he needs a note for his job stating, "he can not come back until he has been cleared from neurology." Patient states he can access the note on mychart. Please advise. Will send to provider who saw patient today.

## 2020-02-22 NOTE — Telephone Encounter (Signed)
After patient left PHQ2 showed score of 4.  Called patient and discussed his elevated poor.  Patient states he has had chronic depression.  Does not want to seek counseling at this time as he states it feels difficult to talk to others about this.  Depression stems from patient being the sole caretaker of most of his family.  Denies any SI/HI at this time.  Will contact us if he desires treatment in the future.  Dalphine Handing, PGY-3 [facility] 02/22/2020 12:04 PM

## 2020-02-22 NOTE — Patient Instructions (Addendum)
1. Please make sure to follow up with neurology. I have scheduled you for 02/24/20 @ 10:30am [facility] [address], [place], [place] [address] [phone] 2. We will get blood work today 3. If you have any further episodes YOU MUST GO TO THE EMERGENCY ROOM!

## 2020-02-23 ENCOUNTER — Encounter: Payer: Self-pay | Admitting: Family Medicine

## 2020-02-23 LAB — COMPREHENSIVE METABOLIC PANEL
ALT: 17 IU/L (ref 0–44)
AST: 15 IU/L (ref 0–40)
Albumin/Globulin Ratio: 2 (ref 1.2–2.2)
Albumin: 4.5 g/dL (ref 3.8–4.9)
Alkaline Phosphatase: 72 IU/L (ref 48–121)
BUN/Creatinine Ratio: 18 (ref 9–20)
BUN: 17 mg/dL (ref 6–24)
Bilirubin Total: 0.6 mg/dL (ref 0.0–1.2)
CO2: 24 mmol/L (ref 20–29)
Calcium: 9.7 mg/dL (ref 8.7–10.2)
Chloride: 98 mmol/L (ref 96–106)
Creatinine, Ser: 0.94 mg/dL (ref 0.76–1.27)
GFR calc Af Amer: 107 mL/min/{1.73_m2} (ref >59)
GFR calc non Af Amer: 92 mL/min/{1.73_m2} (ref >59)
Globulin, Total: 2.3 g/dL (ref 1.5–4.5)
Glucose: 288 mg/dL — ABNORMAL HIGH (ref 65–99)
Potassium: 4.4 mmol/L (ref 3.5–5.2)
Sodium: 136 mmol/L (ref 134–144)
Total Protein: 6.8 g/dL (ref 6.0–8.5)

## 2020-02-23 LAB — CBC
Hematocrit: 41 % (ref 37.5–51.0)
Hemoglobin: 13.6 g/dL (ref 13.0–17.7)
MCH: 30.8 pg (ref 26.6–33.0)
MCHC: 33.2 g/dL (ref 31.5–35.7)
MCV: 93 fL (ref 79–97)
Platelets: 206 10*3/uL (ref 150–450)
RBC: 4.41 x10E6/uL (ref 4.14–5.80)
RDW: 12.6 % (ref 11.6–15.4)
WBC: 11.2 10*3/uL — ABNORMAL HIGH (ref 3.4–10.8)

## 2020-02-23 NOTE — Telephone Encounter (Signed)
Letter uploaded to Jilda Roche, DO, PGY-3 [facility] 02/23/2020 8:21 AM

## 2020-02-24 ENCOUNTER — Other Ambulatory Visit: Payer: Self-pay

## 2020-02-24 ENCOUNTER — Encounter: Payer: Self-pay | Admitting: Diagnostic Neuroimaging

## 2020-02-24 ENCOUNTER — Ambulatory Visit: Payer: Self-pay | Admitting: Diagnostic Neuroimaging

## 2020-02-24 VITALS — BP 124/83 | HR 77 | Ht 67.0 in | Wt 151.0 lb

## 2020-02-24 DIAGNOSIS — R55 Syncope and collapse: Secondary | ICD-10-CM

## 2020-02-24 NOTE — Patient Instructions (Addendum)
-   check MRI brain, EEG  - follow up with cardiology for unprovoked syncope workup  - According to [place] law, you can not drive unless you are seizure / syncope free for at least 6 months and under physician's care.   - Please maintain precautions. Do not participate in activities where a loss of awareness could harm you or someone else. No swimming alone, no tub bathing, no hot tubs, no driving, no operating motorized vehicles (cars, ATVs, motocycles, etc), lawnmowers, power tools or firearms. No standing at heights, such as rooftops, ladders or stairs. Avoid hot objects such as stoves, heaters, open fires. Wear a helmet when riding a bicycle, scooter, skateboard, etc. and avoid areas of traffic. Set your water heater to 120 degrees or less.
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
## 2020-02-27 ENCOUNTER — Encounter: Payer: Self-pay | Admitting: Family Medicine

## 2020-02-27 ENCOUNTER — Other Ambulatory Visit: Payer: Self-pay

## 2020-02-27 ENCOUNTER — Ambulatory Visit (INDEPENDENT_AMBULATORY_CARE_PROVIDER_SITE_OTHER): Payer: Self-pay | Admitting: Family Medicine

## 2020-02-27 VITALS — BP 118/70 | HR 74 | Ht 67.0 in | Wt 151.0 lb

## 2020-02-27 DIAGNOSIS — R55 Syncope and collapse: Secondary | ICD-10-CM

## 2020-02-27 DIAGNOSIS — F332 Major depressive disorder, recurrent severe without psychotic features: Secondary | ICD-10-CM

## 2020-02-27 DIAGNOSIS — R569 Unspecified convulsions: Secondary | ICD-10-CM

## 2020-02-27 DIAGNOSIS — E119 Type 2 diabetes mellitus without complications: Secondary | ICD-10-CM

## 2020-02-27 DIAGNOSIS — F4310 Post-traumatic stress disorder, unspecified: Secondary | ICD-10-CM

## 2020-02-27 LAB — POCT GLYCOSYLATED HEMOGLOBIN (HGB A1C): HbA1c, POC (controlled diabetic range): 8.8 % — AB (ref 0.0–7.0)

## 2020-02-27 MED ORDER — SERTRALINE HCL 100 MG PO TABS: 100.0000 mg | ORAL_TABLET | Freq: Every day | ORAL | 3 refills | Status: DC

## 2020-02-27 MED ORDER — GLIPIZIDE 5 MG PO TABS: 5.0000 mg | ORAL_TABLET | Freq: Every day | ORAL | 3 refills | Status: DC

## 2020-02-27 NOTE — Patient Instructions (Signed)
We are referring you back to cardiology. Please call them as well to be seen as soon as possible.   We are starting you on a new diabetes medication called glipizide. Please follow up with [place] in 1 month for your diabetes. Check you fasting blood sugars daily.   We are ordering an echocardiogram to help work you up for syncope.   We increased your sertraline to 100 mg for depression. Follow up in 1 month to discuss your depression.

## 2020-02-27 NOTE — Progress Notes (Signed)
    SUBJECTIVE:   CHIEF COMPLAINT / HPI:   Seizure like activity vs. Syncope Patient following up for seizure like activity vs. Syncope.  Last occurred last Thrusday.  Patient was seated.  He felt it "coming on".  Decribes getting tunnel vision and then following over.  He is unsure how long he was out for.  He reports being drenched in sweat and having to go to the bathroom, but no bowel or bladder incontenince.  He has been seen by neurology who plans to get an outpatient MRI and EEG.  Patient cannot drive due to seizure restrictions. He is finding it hard to get to work. He needs time off or he will be fired.   Depression PHQ9 SCORE ONLY 02/27/2020 02/22/2020 12/12/2019  PHQ-9 Total Score 11 4 7   Negative for SI.  Takes sertraline.  Feels that he has less stress in his life and that his depression is worse than normal.  Diabetes mellitus type 2 A1c slightly improved today from 9.2 > 8.8.  Patient takes Metformin 500 mg XR daily.  He is not on any other medication.  PERTINENT  PMH / PSH: CAD, HFrEF  OBJECTIVE:   BP 118/70   Pulse 74   Ht 5\' 7"  (1.702 m)   Wt 151 lb (68.5 kg)   SpO2 98%   BMI 23.65 kg/m   Gen: NAD, resting comfortably CV: RRR with no murmurs appreciated Pulm: NWOB, CTAB with no crackles, wheezes, or rhonchi GI: Normal bowel sounds present. Soft, Nontender, Nondistended. MSK: no edema, cyanosis, or clubbing noted Skin: warm, dry Neuro: grossly normal, moves all extremities    ASSESSMENT/PLAN:   Seizure-like activity (HCC) Versus syncope.   - Referred patient to cardiology -Echocardiogram -Continue to follow with neurology work-up  MDD (major depressive disorder) Mood still depressed. -Increase sertraline to 100 mg -Follow-up in 1 month  Controlled type 2 diabetes mellitus without complication, without long-term current use of insulin ([place]) Slightly improved, but still uncontrolled -Continue Metformin -Add glipizide 5 mg, patient is  uninsured and cannot afford other agents     Bonnita Hollow, MD [place]

## 2020-02-27 NOTE — Assessment & Plan Note (Signed)
Versus syncope.   - Referred patient to cardiology -Echocardiogram -Continue to follow with neurology work-up

## 2020-02-27 NOTE — Assessment & Plan Note (Signed)
Slightly improved, but still uncontrolled -Continue Metformin -Add glipizide 5 mg, patient is uninsured and cannot afford other agents

## 2020-02-27 NOTE — Assessment & Plan Note (Signed)
Mood still depressed. -Increase sertraline to 100 mg -Follow-up in 1 month

## 2020-02-28 ENCOUNTER — Telehealth: Payer: Self-pay | Admitting: Diagnostic Neuroimaging

## 2020-02-28 NOTE — Progress Notes (Signed)
Virtual Visit via Telephone Note   This visit type was conducted due to national recommendations for restrictions regarding the COVID-19 Pandemic (e.g. social distancing) in an effort to limit this patient's exposure and mitigate transmission in our community.  Due to his co-morbid illnesses, this patient is at least at moderate risk for complications without adequate follow up.  This format is felt to be most appropriate for this patient at this time.  The patient did not have access to video technology/had technical difficulties with video requiring transitioning to audio format only (telephone).  All issues noted in this document were discussed and addressed.  No physical exam could be performed with this format.  Please refer to the patient's chart for his  consent to telehealth for [facility].   The patient was identified using 2 identifiers.  Date:  02/29/2020   ID:  Andrew Huerta, DOB 1966/08/18, MRN [id number]  Patient Location: Home Provider Location: Home  PCP:  Andrew Haymaker, MD  Cardiologist:  Andrew Gilles Martinique, MD  Electrophysiologist:  None   Evaluation Performed:  Follow-Up Visit  Chief Complaint:  CAD  History of Present Illness:    Andrew Huerta is a 54 y.o. male with a hx of CAD,chronic back pain, DM 2, Barrett's esophagus, history of adrenal tumor s/padrenal gland resection at [facility], HTN, HLD, primary hyperaldosteronism s/padrenalectomy, and OSA. Patient had a abnormal stress test in 2015. Subsequent cardiac catheterization showed two-vessel disease. He underwent stenting of mid LAD and mid left circumflex. Due to recurrent chest discomfort, he had repeat cardiac catheterization later in the year that showed patent stents. He was readmitted in November 2015 for recurrent chest pain. Myoview was normal.Cardiaccatheterization was performed in July 2016 which showed patent stents.More recently, patient was admitted in May 2020 with vague chest discomfort.  Troponin was negative. Echocardiogram obtained on 01/04/2019 showed EF 35 to 40%, new inferior wall motion abnormality. Cardiac catheterization performed on 01/04/2019 revealed a 99% mid RCA disease, 100% mid to distal RCA lesion with distal left to right collateralization from LAD, 40% ostial to proximal LAD disease, widely patent proximal LAD stent, 80% proximal to mid LAD lesion treated with a 2.5 x 18 mm resolute Onyx DES postdilated to 2.6 mm, patent ostial to proximal left circumflex stent.Lipid test obtained during the admission showed LDL of 136. Postprocedure, he was placed on aspirin and Plavix.   He was admitted on 04/11/2019 with nausea, vomiting, fatigue and the severe sweating. He was seen in the hospital, who felt this is unlikely ACS. It was suspected his nausea and vomiting is related to history of Barrett's esophagus. He was seen by lipid clinic on 04/21/2019 for consideration of Repatha.  He has continued to work at [employer]. He has been intolerant of Imdur and unable to afford Ranexa.   In the last few months he has had continued episodes of feeling hot, sweaty, lightheaded, tunnel vision.  This culminated in an episode on 02/21/2020 where he was at a restaurant, felt hot and lightheaded, sweating, drifted down with tunnel vision and passed out.  He had some brief convulsions per girlfriend.  No tongue biting or incontinence.  When he woke up he had the sudden urge to defecate.  He was able to go to the bathroom.  Afterwards he felt tired and slept at home. He was seen by Neuro and MRI and EEG ordered. Instructed not to drive for 6 months. Echo also ordered.   He states he has  had at least 4 episodes of this in a 2 week span. Once at work, once while driving. Another in a restaurant and once at home. He feels dizzy and lightheaded then passes out. Is out for 2 minutes. Has N/V and diarrhea after and feels extremely fatigued. Also feels stupid- can't think  straight.   From a cardiac standpoint he is still having chest pain on a daily basis but this has not changed in frequency or severity.  The patient does not have symptoms concerning for COVID-19 infection (fever, chills, cough, or new shortness of breath).    Past Medical History:  Diagnosis Date   Adrenal tumor    a. s/p adrenal gland resection at [facility]. left side removal per patient   Anxiety    Barrett's esophagus    EGD - 11/27/09 - short segment of Barrett's   CAD (coronary artery disease)    a. 04/27/14 [place] s/p DES to mLAD and DES to mLCx   Chronic back pain    upper and lower per patient   Chronic chest pain    Degenerative joint disease    Bilateral knees. Significant knee pain since playing football in high school. also in back per patient   Depression    Diabetes mellitus type 2, controlled ([place]) 08/29/2015   Diagnosed by A1c 7.6% during 08/26/15 hospital admission for chest pain   Gastroesophageal reflux disease with hiatal hernia    GERD (gastroesophageal reflux disease)    Hiatal hernia    EGD - 11/27/2009   Hyperlipidemia    Hypertension    Low back pain    Primary hyperaldosteronism ([place]) 01/22/2012   S/P adrenalectomy      PTSD (post-traumatic stress disorder)    Seizures ([place])    Sleep apnea    Stone, kidney    Syncope 04/10/2019   Past Surgical History:  Procedure Laterality Date   ADRENALECTOMY  10/2013   CARDIAC CATHETERIZATION  05/01/2014   Patent stents, other disease unchanged   CARDIAC CATHETERIZATION  04/27/2014   Procedure: CORONARY STENT INTERVENTION;  Surgeon: Emberlyn Burlison M Martinique, MD;  Location[facility] CATH LAB;  Service: Cardiovascular;;  DES mid Cx  DES mid LAD   CARDIAC CATHETERIZATION N/A 03/22/2015   Procedure: Left Heart Cath and Coronary Angiography;  Surgeon: Sherren Mocha, MD;  Location: [place] CV LAB;  Service: Cardiovascular;  Laterality: N/A;   CORONARY ANGIOPLASTY WITH STENT PLACEMENT  04/27/2014   3.0 x 16 mm  Promus DES to the mid LAD and 3.5 x 28 mm Promus to the mid LCx, otherwise 20-30 percent lesions, EF 55%   CORONARY STENT INTERVENTION N/A 01/04/2019   Procedure: CORONARY STENT INTERVENTION;  Surgeon: Troy Sine, MD;  Location: [place] CV LAB;  Service: Cardiovascular;  Laterality: N/A;   KIDNEY STONE SURGERY  X 1   KNEE ARTHROPLASTY Right 1984   KNEE ARTHROSCOPY Right X 6   LEFT HEART CATH AND CORONARY ANGIOGRAPHY N/A 01/04/2019   Procedure: LEFT HEART CATH AND CORONARY ANGIOGRAPHY;  Surgeon: Troy Sine, MD;  Location: [place] CV LAB;  Service: Cardiovascular;  Laterality: N/A;   LEFT HEART CATHETERIZATION WITH CORONARY ANGIOGRAM N/A 04/27/2014   Procedure: LEFT HEART CATHETERIZATION WITH CORONARY ANGIOGRAM;  Surgeon: Gracelee Stemmler M Martinique, MD;  Location: [facility] CATH LAB;  Service: Cardiovascular;  Laterality: N/A;   LEFT HEART CATHETERIZATION WITH CORONARY ANGIOGRAM N/A 05/01/2014   Procedure: LEFT HEART CATHETERIZATION WITH CORONARY ANGIOGRAM;  Surgeon: Burnell Blanks, MD;  Location: [facility] CATH LAB;  Service: Cardiovascular;  Laterality: N/A;   LITHOTRIPSY  X 2     Current Meds  Medication Sig   aspirin EC 81 MG tablet Take 81 mg by mouth daily.   atorvastatin (LIPITOR) 80 MG tablet TAKE ONE TABLET BY MOUTH DAILY   carvedilol (COREG) 3.125 MG tablet Take 1 tablet (3.125 mg total) by mouth 2 (two) times daily with a meal.   clopidogrel (PLAVIX) 75 MG tablet TAKE ONE TABLET BY MOUTH DAILY   diclofenac Sodium (VOLTAREN) 1 % GEL Apply 2 g topically 4 (four) times daily.   glipiZIDE (GLUCOTROL) 5 MG tablet Take 1 tablet (5 mg total) by mouth daily.   HYDROcodone-acetaminophen (NORCO/VICODIN) 5-325 MG tablet Take 1-2 tablets by mouth every 8 (eight) hours as needed for moderate pain.   metFORMIN (GLUCOPHAGE-XR) 500 MG 24 hr tablet TAKE ONE TABLET BY MOUTH DAILY WITH BREAKFAST X 1 WEEK IF TOLERATED WELL INCREASE TO 1 EVERY MORNING AND 1 EVERY EVENING   ondansetron (ZOFRAN  ODT) 4 MG disintegrating tablet Take 1 tablet (4 mg total) by mouth every 8 (eight) hours as needed for nausea or vomiting.   ondansetron (ZOFRAN) 4 MG tablet Take 1 tablet (4 mg total) by mouth every 6 (six) hours as needed for nausea.   pantoprazole (PROTONIX) 40 MG tablet TAKE ONE TABLET BY MOUTH DAILY   sertraline (ZOLOFT) 100 MG tablet Take 1 tablet (100 mg total) by mouth daily.   sildenafil (VIAGRA) 50 MG tablet Take 1 tablet (50 mg total) by mouth as needed for erectile dysfunction.     Allergies:   Ozempic (0.25 or 0.5 mg-dose) [semaglutide(0.25 or 0.5mg -dos)], Cortisone acetate, Darvocet [propoxyphene n-acetaminophen], Nsaids, Xanax [alprazolam], and Tape   Social History   Tobacco Use   Smoking status: Never Smoker   Smokeless tobacco: Never Used  Vaping Use   Vaping Use: Never used  Substance Use Topics   Alcohol use: Yes    Alcohol/week: 0.0 standard drinks    Comment: "seldom" per patient   Drug use: Yes    Types: Marijuana     Family Hx: The patient's family history includes Cancer in his father; Diabetes in his father and paternal grandmother; Heart attack in his paternal grandfather.  ROS:   Please see the history of present illness.    All other systems reviewed and are negative.   Prior CV studies:   The following studies were reviewed today:  Cath 01/04/2019  Mid RCA lesion is 99% stenosed.  Mid RCA to Dist RCA lesion is 100% stenosed.  Ost LAD to Prox LAD lesion is 40% stenosed.  Previously placed Prox LAD stent (unknown type) is widely patent.  Prox LAD to Mid LAD lesion is 80% stenosed.  A stent was successfully placed.  Post intervention, there is a 0% residual stenosis.  Post intervention, there is a 0% residual stenosis.  Mid LAD lesion is 30% stenosed.  Previously placed Ost Cx to Prox Cx stent (unknown type) is widely patent.  Multivessel CAD with 40% smooth ostial proximal stenosis of the LAD with a septal perforating  artery that arises from the ostium and has a 90% stenosis (not able to be depicted in the diagram), widely patent stent proximally followed by new 80% stenosis the on the stented segment and proximal to the mid diagonal vessels. There is 30% LAD stenosis beyond the mid diagonal vessels; patent moderate-sized ramus intermediate vessel; patent proximal left circumflex stent; and diffuse 99% to 100% occlusion of the mid distal RCA with  evidence for moderate left to right collateralization from the LAD, septal perforating arteries, and LCX.  LVEDP 17 mmHg.  Successful PCI to the 80% LAD stenosis with ultimate insertion of a 2.5 x 18 mm Resolute Onyx DES stent postdilated to 2.6 mm with the 80% stenosis being reduced to 0%.  RECOMMENDATION: DAPT for minimum of 1 year and possibly long-term due to the patient's significant CAD history. Aggressive lipid-lowering therapy with target LDL less than 70. Medical therapy for RCA occlusion with collateralization.   Labs/Other Tests and Data Reviewed:    EKG:  No ECG reviewed.  Recent Labs: 02/22/2020: ALT 17; BUN 17; Creatinine, Ser 0.94; Hemoglobin 13.6; Platelets 206; Potassium 4.4; Sodium 136   Recent Lipid Panel Lab Results  Component Value Date/Time   CHOL 251 (H) 04/12/2019 04:27 AM   CHOL 235 (H) 10/01/2018 09:12 AM   TRIG 190 (H) 04/12/2019 04:27 AM   HDL 44 04/12/2019 04:27 AM   HDL 55 10/01/2018 09:12 AM   CHOLHDL 5.7 04/12/2019 04:27 AM   LDLCALC 169 (H) 04/12/2019 04:27 AM   LDLCALC 152 (H) 10/01/2018 09:12 AM   LDLDIRECT 169 (H) 07/08/2011 09:44 AM    Wt Readings from Last 3 Encounters:  02/29/20 153 lb 1 oz (69.4 kg)  02/27/20 151 lb (68.5 kg)  02/24/20 151 lb (68.5 kg)     Objective:    Vital Signs:  BP 118/80    Pulse 79    Ht 5\' 7"  (1.702 m)    Wt 153 lb 1 oz (69.4 kg)    BMI 23.97 kg/m    VITAL SIGNS:  reviewed  ASSESSMENT & PLAN:    1. CAD: Last cardiac catheterization was in May 2020 with stenting of the RCA  and mid LAD. Prior stenting of the LCx and LAD in the past. Diffuse small vessel disease.  Continue aspirin and Plavix. He has stable angina. Unable to tolerate nitrates. Can't afford Ranexa. No recent change in anginal pattern.  3. Hypertension: Blood pressure stable on low dose Coreg  4. Hyperlipidemia: On Lipitor 80 mg daily. Still not at goal LDL. Would be a good candidate for Repatha but needs financial assistance. Needs to reapply for Medicare.  5. DM2: Managed by primary care provider.  6. Syncope versus seizure. Neuro work up in progress with MRI and EEG. Will obtain Echo and also have him wear an Event monitor. Follow up after above studies. Instructed not to drive.   COVID-19 Education: The signs and symptoms of COVID-19 were discussed with the patient and how to seek care for testing (follow up with PCP or arrange E-visit).  The importance of social distancing was discussed today.  Time:   Today, I have spent 15 minutes with the patient with telehealth technology discussing the above problems.     Medication Adjustments/Labs and Tests Ordered: Current medicines are reviewed at length with the patient today.  Concerns regarding medicines are outlined above.   Tests Ordered: No orders of the defined types were placed in this encounter.   Medication Changes: No orders of the defined types were placed in this encounter.   Follow Up:  In Person in 1 month(s)  Signed, Tyrene Nader Martinique, MD  02/29/2020 8:34 AM    [facility]

## 2020-02-28 NOTE — Telephone Encounter (Signed)
self pay order sent to GI. They will reach out to the patient to schedule.  °

## 2020-02-29 ENCOUNTER — Encounter: Payer: Self-pay | Admitting: Cardiology

## 2020-02-29 ENCOUNTER — Telehealth: Payer: Self-pay

## 2020-02-29 ENCOUNTER — Telehealth (INDEPENDENT_AMBULATORY_CARE_PROVIDER_SITE_OTHER): Payer: Self-pay | Admitting: Cardiology

## 2020-02-29 VITALS — BP 118/80 | HR 79 | Ht 67.0 in | Wt 153.1 lb

## 2020-02-29 DIAGNOSIS — I25118 Atherosclerotic heart disease of native coronary artery with other forms of angina pectoris: Secondary | ICD-10-CM

## 2020-02-29 DIAGNOSIS — E785 Hyperlipidemia, unspecified: Secondary | ICD-10-CM

## 2020-02-29 DIAGNOSIS — R55 Syncope and collapse: Secondary | ICD-10-CM

## 2020-02-29 DIAGNOSIS — I10 Essential (primary) hypertension: Secondary | ICD-10-CM

## 2020-02-29 NOTE — Patient Instructions (Signed)
Medication Instructions:  Continue same medications *If you need a refill on your cardiac medications before your next appointment, please call your pharmacy*   Lab Work: None ordered    Testing/Procedures: 30 day Event Monitor   Follow-Up: At [facility], you and your health needs are our priority.  As part of our continuing mission to provide you with exceptional heart care, we have created designated Provider Care Teams.  These Care Teams include your primary Cardiologist (physician) and Advanced Practice Providers (APPs -  Physician Assistants and Nurse Practitioners) who all work together to provide you with the care you need, when you need it.  We recommend signing up for the patient portal called "MyChart".  Sign up information is provided on this After Visit Summary.  MyChart is used to connect with patients for Virtual Visits (Telemedicine).  Patients are able to view lab/test results, encounter notes, upcoming appointments, etc.  Non-urgent messages can be sent to your provider as well.   To learn more about what you can do with MyChart, go to [email or web address].      Your next appointment:  Tuesday 04/03/20 at 8:15 am   The format for your next appointment: Office     Provider:  Coletta Memos NP     [employer] Cardiac Event Monitor Instructions Your physician has requested you wear your cardiac event monitor for  30 days. [employer] may call or text to confirm a shipping address. The monitor will be sent to a land address via UPS. [employer] will not ship a monitor to a PO BOX. It typically takes 3-5 days to receive your monitor after it has been enrolled. [employer] will assist with [name] tracking if your package is delayed. The telephone number for [employer] is [phone]. Once you have received your monitor, please review the enclosed instructions. Instruction tutorials can also be viewed under help and settings on the enclosed cell phone. Your monitor  has already been registered assigning a specific monitor serial # to you.  Applying the monitor Remove cell phone from case and turn it on. The cell phone works as Dealer and needs to be within [employer] of you at all times. The cell phone will need to be charged on a daily basis. We recommend you plug the cell phone into the enclosed charger at your bedside table every night.  Monitor batteries: You will receive two monitor batteries labelled #1 and #2. These are your recorders. Plug battery #2 onto the second connection on the enclosed charger. Keep one battery on the charger at all times. This will keep the monitor battery deactivated. It will also keep it fully charged for when you need to switch your monitor batteries. A small light will be blinking on the battery emblem when it is charging. The light on the battery emblem will remain on when the battery is fully charged.  Open package of a Monitor strip. Insert battery #1 into black hood on strip and gently squeeze monitor battery onto connection as indicated in instruction booklet. Set aside while preparing skin.  Choose location for your strip, vertical or horizontal, as indicated in the instruction booklet. Shave to remove all hair from location. There cannot be any lotions, oils, powders, or colognes on skin where monitor is to be applied. Wipe skin clean with enclosed Saline wipe. Dry skin completely.  Peel paper labeled #1 off the back of the Monitor strip exposing the adhesive. Place the monitor on the chest in the vertical or horizontal position shown  in the instruction booklet. One arrow on the monitor strip must be pointing upward. Carefully remove paper labeled #2, attaching remainder of strip to your skin. Try not to create any folds or wrinkles in the strip as you apply it.  Firmly press and release the circle in the center of the monitor battery. You will hear a small beep. This is turning the monitor battery  on. The heart emblem on the monitor battery will light up every 5 seconds if the monitor battery in turned on and connected to the patient securely. Do not push and hold the circle down as this turns the monitor battery off. The cell phone will locate the monitor battery. A screen will appear on the cell phone checking the connection of your monitor strip. This may read poor connection initially but change to good connection within the next minute. Once your monitor accepts the connection you will hear a series of 3 beeps followed by a climbing crescendo of beeps. A screen will appear on the cell phone showing the two monitor strip placement options. Touch the picture that demonstrates where you applied the monitor strip.  Your monitor strip and battery are waterproof. You are able to shower, bathe, or swim with the monitor on. They just ask you do not submerge deeper than 3 feet underwater. We recommend removing the monitor if you are swimming in a lake, river, or ocean.  Your monitor battery will need to be switched to a fully charged monitor battery approximately once a week. The cell phone will alert you of an action which needs to be made.  On the cell phone, tap for details to reveal connection status, monitor battery status, and cell phone battery status. The green dots indicates your monitor is in good status. A red dot indicates there is something that needs your attention.  To record a symptom, click the circle on the monitor battery. In 30-60 seconds a list of symptoms will appear on the cell phone. Select your symptom and tap save. Your monitor will record a sustained or significant arrhythmia regardless of you clicking the button. Some patients do not feel the heart rhythm irregularities. [employer] will notify us of any serious or critical events.  Refer to instruction booklet for instructions on switching batteries, changing strips, the Do not disturb or Pause features, or any  additional questions.  Call [employer] at [phone], to confirm your monitor is transmitting and record your baseline. They will answer any questions you may have regarding the monitor instructions at that time.  Returning the monitor to [place] all equipment back into blue box. Peel off strip of paper to expose adhesive and close box securely. There is a prepaid UPS shipping label on this box. Drop in a UPS drop box, or at a UPS facility like [employer]. You may also contact [employer] to arrange UPS to pick up monitor package at your home.

## 2020-02-29 NOTE — Telephone Encounter (Signed)
30 day Event Monitor registered to be mailed to pt's home address. Pt is self-pay and aware that [employer] will call.

## 2020-02-29 NOTE — Addendum Note (Signed)
Addended by: Kathyrn Lass on: 02/29/2020 10:32 AM   Modules accepted: Orders

## 2020-02-29 NOTE — Telephone Encounter (Signed)
  Patient Consent for Virtual Visit         Andrew Huerta has provided verbal consent on 02/29/2020 for a virtual visit (video or telephone).   CONSENT FOR VIRTUAL VISIT FOR:  Andrew Huerta  By participating in this virtual visit I agree to the following:  I hereby voluntarily request, consent and authorize [facility] and its employed or contracted physicians, physician assistants, nurse practitioners or other licensed health care professionals (the Practitioner), to provide me with telemedicine health care services (the "Services") as deemed necessary by the treating Practitioner. I acknowledge and consent to receive the Services by the Practitioner via telemedicine. I understand that the telemedicine visit will involve communicating with the Practitioner through live audiovisual communication technology and the disclosure of certain medical information by electronic transmission. I acknowledge that I have been given the opportunity to request an in-person assessment or other available alternative prior to the telemedicine visit and am voluntarily participating in the telemedicine visit.  I understand that I have the right to withhold or withdraw my consent to the use of telemedicine in the course of my care at any time, without affecting my right to future care or treatment, and that the Practitioner or I may terminate the telemedicine visit at any time. I understand that I have the right to inspect all information obtained and/or recorded in the course of the telemedicine visit and may receive copies of available information for a reasonable fee.  I understand that some of the potential risks of receiving the Services via telemedicine include:  Marland Kitchen Delay or interruption in medical evaluation due to technological equipment failure or disruption; . Information transmitted may not be sufficient (e.g. poor resolution of images) to allow for appropriate medical decision making by the Practitioner;  and/or  . In rare instances, security protocols could fail, causing a breach of personal health information.  Furthermore, I acknowledge that it is my responsibility to provide information about my medical history, conditions and care that is complete and accurate to the best of my ability. I acknowledge that Practitioner's advice, recommendations, and/or decision may be based on factors not within their control, such as incomplete or inaccurate data provided by me or distortions of diagnostic images or specimens that may result from electronic transmissions. I understand that the practice of medicine is not an exact science and that Practitioner makes no warranties or guarantees regarding treatment outcomes. I acknowledge that a copy of this consent can be made available to me via my patient portal ([facility]), or I can request a printed copy by calling the office of [facility].    I understand that my insurance will be billed for this visit.   I have read or had this consent read to me. . I understand the contents of this consent, which adequately explains the benefits and risks of the Services being provided via telemedicine.  . I have been provided ample opportunity to ask questions regarding this consent and the Services and have had my questions answered to my satisfaction. . I give my informed consent for the services to be provided through the use of telemedicine in my medical care

## 2020-03-01 ENCOUNTER — Other Ambulatory Visit: Payer: Self-pay | Admitting: *Deleted

## 2020-03-01 ENCOUNTER — Ambulatory Visit (HOSPITAL_COMMUNITY)
Admission: RE | Admit: 2020-03-01 | Discharge: 2020-03-01 | Disposition: A | Discharge status: Home / Self Care | Payer: Self-pay | Source: Ambulatory Visit | Attending: Family Medicine | Admitting: Family Medicine

## 2020-03-01 ENCOUNTER — Other Ambulatory Visit: Payer: Self-pay

## 2020-03-01 DIAGNOSIS — E119 Type 2 diabetes mellitus without complications: Secondary | ICD-10-CM | POA: Insufficient documentation

## 2020-03-01 DIAGNOSIS — I25119 Atherosclerotic heart disease of native coronary artery with unspecified angina pectoris: Secondary | ICD-10-CM | POA: Insufficient documentation

## 2020-03-01 DIAGNOSIS — R55 Syncope and collapse: Secondary | ICD-10-CM | POA: Insufficient documentation

## 2020-03-01 DIAGNOSIS — E785 Hyperlipidemia, unspecified: Secondary | ICD-10-CM | POA: Insufficient documentation

## 2020-03-01 DIAGNOSIS — I5020 Unspecified systolic (congestive) heart failure: Secondary | ICD-10-CM | POA: Insufficient documentation

## 2020-03-01 DIAGNOSIS — G4730 Sleep apnea, unspecified: Secondary | ICD-10-CM | POA: Insufficient documentation

## 2020-03-01 DIAGNOSIS — I5021 Acute systolic (congestive) heart failure: Secondary | ICD-10-CM

## 2020-03-01 DIAGNOSIS — I110 Hypertensive heart disease with heart failure: Secondary | ICD-10-CM | POA: Insufficient documentation

## 2020-03-01 MED ORDER — CARVEDILOL 3.125 MG PO TABS: 3.1250 mg | ORAL_TABLET | Freq: Two times a day (BID) | ORAL | 3 refills | Status: DC

## 2020-03-01 NOTE — Progress Notes (Signed)
  Echocardiogram 2D Echocardiogram has been performed.  Andrew Huerta 03/01/2020, 11:39 AM

## 2020-03-07 ENCOUNTER — Ambulatory Visit (INDEPENDENT_AMBULATORY_CARE_PROVIDER_SITE_OTHER): Payer: Self-pay

## 2020-03-07 DIAGNOSIS — I10 Essential (primary) hypertension: Secondary | ICD-10-CM

## 2020-03-07 DIAGNOSIS — I25118 Atherosclerotic heart disease of native coronary artery with other forms of angina pectoris: Secondary | ICD-10-CM

## 2020-03-07 DIAGNOSIS — E785 Hyperlipidemia, unspecified: Secondary | ICD-10-CM

## 2020-03-07 DIAGNOSIS — R55 Syncope and collapse: Secondary | ICD-10-CM

## 2020-03-08 ENCOUNTER — Telehealth: Payer: Self-pay | Admitting: Cardiology

## 2020-03-08 NOTE — Telephone Encounter (Signed)
° ° °  Pt said his heart monitor having issue, on the skin contact setting it keeps going back and forth from god contact to poor contact. He said he doesn't know what to do

## 2020-03-08 NOTE — Telephone Encounter (Signed)
Spoke with pt, when he got up this morning the phone read good contact and now it is alerting bad contact and he has not moved the electrode. He has the electrode vertically in the center of his chest. Aware will forward to monitor techs to see if the position needs to be changed. He reports it is not irritating his skin, it is just not getting good contact. Will have the monitor tech call the patient back.

## 2020-03-08 NOTE — Telephone Encounter (Signed)
Reviewed troubleshooting poor sticker contact with patient.  Instructed patient to call [employer] to have them ship additional strips to his home.

## 2020-03-11 ENCOUNTER — Other Ambulatory Visit: Payer: Self-pay

## 2020-03-11 ENCOUNTER — Ambulatory Visit
Admission: RE | Admit: 2020-03-11 | Discharge: 2020-03-11 | Disposition: A | Discharge status: Home / Self Care | Payer: Self-pay | Source: Ambulatory Visit | Attending: Diagnostic Neuroimaging | Admitting: Diagnostic Neuroimaging

## 2020-03-11 DIAGNOSIS — R55 Syncope and collapse: Secondary | ICD-10-CM

## 2020-03-11 MED ORDER — GADOBENATE DIMEGLUMINE 529 MG/ML IV SOLN
14.0000 mL | Freq: Once | INTRAVENOUS | Status: AC | PRN
  Administered 2020-03-11: 14 mL via INTRAVENOUS

## 2020-03-13 ENCOUNTER — Telehealth: Payer: Self-pay

## 2020-03-13 ENCOUNTER — Ambulatory Visit (INDEPENDENT_AMBULATORY_CARE_PROVIDER_SITE_OTHER): Payer: Self-pay | Admitting: Family Medicine

## 2020-03-13 ENCOUNTER — Other Ambulatory Visit: Payer: Self-pay

## 2020-03-13 DIAGNOSIS — G894 Chronic pain syndrome: Secondary | ICD-10-CM

## 2020-03-13 DIAGNOSIS — R569 Unspecified convulsions: Secondary | ICD-10-CM

## 2020-03-13 DIAGNOSIS — R112 Nausea with vomiting, unspecified: Secondary | ICD-10-CM

## 2020-03-13 DIAGNOSIS — F332 Major depressive disorder, recurrent severe without psychotic features: Secondary | ICD-10-CM

## 2020-03-13 MED ORDER — HYDROCODONE-ACETAMINOPHEN 5-325 MG PO TABS: 1.0000 | ORAL_TABLET | Freq: Three times a day (TID) | ORAL | 0 refills | Status: DC | PRN

## 2020-03-13 NOTE — Patient Instructions (Signed)
Syncope/seizure work-up: I am sorry that this work-up has been very time-consuming.  I do think that it is appropriate for your safety and the safety of your coworkers to keep you out of work for now.  I have written a note excusing you from work until 8/3 when he had the opportunity follow-up with cardiology.  If the financial situation becomes increasingly tight, lets discuss this again.  If you have obvious symptoms, leading up to these episodes, it may be reasonable to send you back to work.  The most dangerous situation would be a total unawareness that an episode will strike and then passing out in a precarious setting.  Please remember to call neurology to try and move forward your follow-up appointment.  Depression: Mom started her that it more difficult for her to spend a lot of time at home now.  I think it is too soon to make medication changes.  Please see the list of providers below for therapist in the community who see patients without insurance.  Outpatient Mental Health Providers (No Insurance required or Self Pay)  [facility]) can see uninsured folks for outpatient therapy [email or web address]-[email or web address]/ [address] [place], [place] [address] [phone]  [place] Mon-Fri, 8am-3pm [email or web address], [email or web address], [place]   [place] [phone]- [facility] for psych med management, there may be a wait- if [facility] is working with clients for OPT, they will coordinate with [place] for [place]   Walk-in-Clinic: Monday- Friday 9:00 AM - 4:00 PM [place], [place] [phone]  [facility] [place] ([employer]) walk in M-F 8am-12pm and  1pm-3pm [place]  [place]  Phone: [phone]  [employer] ([place] and substance challenges) [address] [place] [address], [place] [phone]    [email or web address], [place]     Phone:  [phone] [place]  [place]  [place]  [phone] TransportationAnalyst.gl   [facility] ( virtual or zoom therapy) strongminds@[employer] .edu  [place]  [place]    [facility] [phone]  grief counseling, dementia and caregiver support    [facility] Walk-in MWF 12:30 to 3:00     [place] [address]  [phone]  www.ADSyes.org call to schedule an appointment    [place] ,Support group, Peer support services, [address] [place], [place] [address] 336- [phone]  [email or web address].[email or web address]           National [employer] on Mental Illness (NAMI) [place]- Wellness classes, Support groups        [address] [place], [place] [address] [phone]   CurrentJokes.cz   [facility]  (Psycho-social Rehabilitation clubhouse, Individual and group therapy) [address] [place], [place] [address]   [phone]  24- Hour Availability:  *[place] or [phone] * [facility] [employer] (Domestic Violence, Rape, etc. )[phone] [place] [phone] or [phone] * [place] [phone] only) [phone] (after hours) *[facility] Mobile Crisis Unit [phone] *[place] National Suicide Hotline 858-418-7178 Diamantina Monks)

## 2020-03-13 NOTE — Assessment & Plan Note (Signed)
-  Continue sertraline 100 mg -List of therapist in the community provided.  He plans to reach out for help regarding grief counseling.

## 2020-03-13 NOTE — Assessment & Plan Note (Signed)
Ongoing work-up.  MRI results not yet available.  We decided to provide a work note until he had a chance to follow-up with cardiology on 8/3 at which time his event monitor may provide additional helpful information. -Work note given to permit him to remain out of work until 8/4 -He was instructed to call his neurologist to request a sooner appointment for the work-up of his seizure-like activity.  He was instructed to let me know if there is any issue requesting a sooner appointment. -He was strongly discouraged from driving at all until we have a better understanding of his symptoms.

## 2020-03-13 NOTE — Telephone Encounter (Signed)
Patient was calling to check the status of his care. He had not been called to schedule an EEG so I scheduled that for him for 03/29/20. He states that he had the MRI brain done on Sunday at [facility]. I advised him that he would receive a call with those results once it has been reviewed.

## 2020-03-13 NOTE — Progress Notes (Signed)
    SUBJECTIVE:   CHIEF COMPLAINT / HPI:   Presyncope versus seizure Mr. Colberg is in the midst of a work-up for presyncopal/seizure like symptoms.  Since his work-up began several weeks ago, he has been seen by neurology and cardiology.  His main reason for coming to clinic today was to clarify the next step in his work-up.  Neurology has recommended that he avoid driving for at least the next 6 months.  An MRI was performed this past weekend, results are not yet available.  Neurology is planning to do an EEG although they have not stated when they plan to do so.  The follow-up with neurology is not for 6 months.  Cardiology has recommended an echocardiogram and an event monitor for 30 days.  The echocardiogram is done with no significant acute changes.  The event monitor was started earlier this month and he has a follow-up appointment on 8/3.  He reports that his episodes continue to happen several times per week.  His last episode was about 4 days ago at which time he was lying in bed felt significant dizziness and passed out.  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  Depression He continues to experience significant symptoms of depression.  This has been more difficult lately because he is not working and spends more of his time at home where his wife died.  He finds that he has been more reflective and depressed.  He denies SI/HI.  His sertraline was recently increased to 100 mg.  PERTINENT  PMH / PSH: CAD, heart failure, GERD, Barrett's esophagus, MDD  OBJECTIVE:   BP 104/60   Pulse 83   Ht 5\' 7"  (1.702 m)   Wt 149 lb 8 oz (67.8 kg)   SpO2 97%   BMI 23.42 kg/m    General: Alert and cooperative and appears to be in no acute distress HEENT: Neck non-tender without lymphadenopathy, masses or thyromegaly Cardio: Normal S1 and S2, no S3 or S4. Rhythm is regular.    Pulm: Clear to auscultation bilaterally, no crackles, wheezing, or diminished breath sounds. Normal respiratory effort Abdomen: Bowel sounds normal. Abdomen soft and non-tender.  Extremities: No peripheral edema. Warm/ well perfused.  Strong radial pulse. Neuro: Cranial nerves grossly intact   ASSESSMENT/PLAN:   Seizure-like activity (HCC) Ongoing work-up.  MRI results not yet available.  We decided to provide a work note until he had a chance to follow-up with cardiology on 8/3 at which time his event monitor may provide additional helpful information. -Work note given to permit him to remain out of work until 8/4 -He was instructed to call his neurologist to request a sooner appointment for the work-up of his seizure-like activity.  He was instructed to let me know if there is any issue requesting a sooner appointment. -He was strongly discouraged from driving at all until we have a better understanding of his symptoms.  MDD (major depressive disorder) -Continue sertraline 100 mg -List of therapist in the community provided.  He plans to reach out for help regarding grief counseling.     Matilde Haymaker, MD [place]

## 2020-03-14 NOTE — Telephone Encounter (Addendum)
Spoke with patient and informed him MRI brain results are unremarkable imaging results. No acute findings. He had no questions. Patient verbalized understanding, appreciation.

## 2020-03-29 ENCOUNTER — Ambulatory Visit (INDEPENDENT_AMBULATORY_CARE_PROVIDER_SITE_OTHER): Payer: Self-pay | Admitting: Diagnostic Neuroimaging

## 2020-03-29 ENCOUNTER — Other Ambulatory Visit: Payer: Self-pay

## 2020-03-29 DIAGNOSIS — R55 Syncope and collapse: Secondary | ICD-10-CM

## 2020-03-29 NOTE — Procedures (Signed)
   [facility]  EEG (ELECTROENCEPHALOGRAM) REPORT   STUDY DATE: 03/29/20 PATIENT NAME: Andrew Huerta DOB: 01-08-1966 MRN: [id number]  ORDERING CLINICIAN: Andrey Spearman, MD   TECHNOLOGIST: Babs Bertin  TECHNIQUE: Electroencephalogram was recorded utilizing standard 10-20 system of lead placement and reformatted into average and bipolar montages.  RECORDING TIME: 26  minutes ACTIVATION: hyperventilation and photic stimulation  CLINICAL INFORMATION: 54 year old male with loss of consciousness spells.  FINDINGS: Posterior dominant background rhythms, which attenuate with eye opening, ranging 8-9 hertz and 15-20 microvolts. No focal, lateralizing, epileptiform activity or seizures are seen. Patient recorded in the awake and drowsy state. EKG channel shows regular rhythm of 70-80 beats per minute.   IMPRESSION:   Normal EEG in the awake and drowsy states.    INTERPRETING PHYSICIAN:  Penni Bombard, MD Certified in Neurology, Neurophysiology and Neuroimaging  [facility] [address] [place], [place] [address] [phone]

## 2020-04-02 ENCOUNTER — Encounter: Payer: Self-pay | Admitting: *Deleted
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
## 2020-04-03 ENCOUNTER — Ambulatory Visit: Payer: Self-pay | Admitting: General Practice

## 2020-04-03 ENCOUNTER — Telehealth: Payer: Self-pay | Admitting: Diagnostic Neuroimaging

## 2020-04-03 NOTE — Telephone Encounter (Signed)
Called patient who asked about his MRI brain results. Explained that his results are unremarkable, no major or acute findings. He asked why he is forgetting tihngs, getting lost in grocery stores. He had several concerns, questions which we discussed in detail. He stated satisfaction at end of conversation. Patient verbalized understanding, appreciation.

## 2020-04-03 NOTE — Telephone Encounter (Signed)
Pt has read results on MyChart. Pt said he is confused and concerned about the results. Have not heard from the nurse. Would like a call from the nurse.

## 2020-04-06 ENCOUNTER — Other Ambulatory Visit: Payer: Self-pay

## 2020-04-06 ENCOUNTER — Ambulatory Visit (INDEPENDENT_AMBULATORY_CARE_PROVIDER_SITE_OTHER): Payer: Self-pay | Admitting: Family Medicine

## 2020-04-06 ENCOUNTER — Ambulatory Visit: Payer: Self-pay | Admitting: Family Medicine

## 2020-04-06 ENCOUNTER — Encounter: Payer: Self-pay | Admitting: Family Medicine

## 2020-04-06 DIAGNOSIS — F332 Major depressive disorder, recurrent severe without psychotic features: Secondary | ICD-10-CM

## 2020-04-06 DIAGNOSIS — R569 Unspecified convulsions: Secondary | ICD-10-CM

## 2020-04-06 MED ORDER — SERTRALINE HCL 100 MG PO TABS: 200.0000 mg | ORAL_TABLET | Freq: Every day | ORAL | 3 refills | Status: DC

## 2020-04-06 NOTE — Assessment & Plan Note (Signed)
PHQ-9 score of 15.  No SI/HI. -He was encouraged to reach out to establish care with a therapist -Ensure that his current sertraline dose is 200 mg daily.  He was told increase his dose if he is only in fact taking 100 mg daily.

## 2020-04-06 NOTE — Assessment & Plan Note (Signed)
Ongoing work-up. -Follow-up with cardiology on 8/20 -New work note provided -We will fill out FMLA paperwork

## 2020-04-06 NOTE — Progress Notes (Signed)
    SUBJECTIVE:   CHIEF COMPLAINT / HPI:   Near syncopal episodes Mr. Andrew Huerta presents to clinic today for follow-up regarding his previous near syncopal episodes.  Since her last visit, he is followed up with neurology and an EKG and MRI showed no concerning findings.  Overall, neurology is not concerned about neurological causes for his near syncopal episodes.  He has follow-up with cardiology on 8/20.  He was initially planning to be seen on the third but had to pursue appointment back because new information from his cardiac monitor was not available.  Anxiety/depression He reports continued struggles with anxiety and depression.  He is currently taking 2 pills of his sertraline at home but is not sure of the individual strength.  He is not sure he is taking 100 mg daily or 200 mg daily.  He was previously provided with a list of therapist but is not yet reached out to establish care.  This is in part due his inability to drive during his work-up for near syncope.  PERTINENT  PMH / PSH: Hypertension, diabetes, anxiety, depression.  OBJECTIVE:   BP 100/60   Pulse 69   Ht 5\' 7"  (1.702 m)   Wt 153 lb 8 oz (69.6 kg)   SpO2 97%   BMI 24.04 kg/m    General: Alert and cooperative and appears to be in no acute distress  Pulm: Breathing comfortably on room air.  No respiratory distress. Extremities: No peripheral edema. Warm/ well perfused.  Neuro: Cranial nerves grossly intact   ASSESSMENT/PLAN:   Seizure-like activity (HCC) Ongoing work-up. -Follow-up with cardiology on 8/20 -New work note provided -We will fill out FMLA paperwork  MDD (major depressive disorder) PHQ-9 score of 15.  No SI/HI. -He was encouraged to reach out to establish care with a therapist -Ensure that his current sertraline dose is 200 mg daily.  He was told increase his dose if he is only in fact taking 100 mg daily.     Matilde Haymaker, MD [place]

## 2020-04-06 NOTE — Patient Instructions (Addendum)
Recurrent syncopal episodes: Your next appointment with cardiology is on 8/20.  I am glad you have been cleared by neurology, they do not think this is anything related to seizures.  The next step will be to figure out if we think there is a heart related cause of your syncopal episodes.  Anxiety/depression: Make sure that you are taking sertraline 200 mg daily.  If you are not taking that dose, increase your medicine to that dose.  Please let me know if you need any additional paperwork filled out.g

## 2020-04-12 ENCOUNTER — Other Ambulatory Visit: Payer: Self-pay

## 2020-04-12 DIAGNOSIS — N521 Erectile dysfunction due to diseases classified elsewhere: Secondary | ICD-10-CM

## 2020-04-12 DIAGNOSIS — G894 Chronic pain syndrome: Secondary | ICD-10-CM

## 2020-04-12 MED ORDER — HYDROCODONE-ACETAMINOPHEN 5-325 MG PO TABS: 1.0000 | ORAL_TABLET | Freq: Three times a day (TID) | ORAL | 0 refills | Status: DC | PRN

## 2020-04-12 MED ORDER — SILDENAFIL CITRATE 50 MG PO TABS: 50.0000 mg | ORAL_TABLET | ORAL | 3 refills | Status: DC | PRN
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
## 2020-04-20 ENCOUNTER — Encounter: Payer: Self-pay | Admitting: General Practice

## 2020-04-20 ENCOUNTER — Ambulatory Visit (INDEPENDENT_AMBULATORY_CARE_PROVIDER_SITE_OTHER): Payer: Self-pay | Admitting: Family Medicine

## 2020-04-20 ENCOUNTER — Encounter: Payer: Self-pay | Admitting: Family Medicine

## 2020-04-20 ENCOUNTER — Ambulatory Visit (INDEPENDENT_AMBULATORY_CARE_PROVIDER_SITE_OTHER): Payer: Self-pay | Admitting: General Practice

## 2020-04-20 ENCOUNTER — Other Ambulatory Visit: Payer: Self-pay

## 2020-04-20 VITALS — BP 112/72 | HR 72 | Ht 67.0 in | Wt 152.2 lb

## 2020-04-20 DIAGNOSIS — I10 Essential (primary) hypertension: Secondary | ICD-10-CM

## 2020-04-20 DIAGNOSIS — I25118 Atherosclerotic heart disease of native coronary artery with other forms of angina pectoris: Secondary | ICD-10-CM

## 2020-04-20 DIAGNOSIS — R55 Syncope and collapse: Secondary | ICD-10-CM

## 2020-04-20 DIAGNOSIS — E785 Hyperlipidemia, unspecified: Secondary | ICD-10-CM

## 2020-04-20 DIAGNOSIS — R569 Unspecified convulsions: Secondary | ICD-10-CM

## 2020-04-20 NOTE — Patient Instructions (Addendum)
Syncopal episodes: I am so glad to hear that there is no good neurological or cardiovascular cause of your syncopal episodes.  This means that we do not have to worry about anything scary causing these events.  I think, most likely, this is related to dehydration and there is probably a psychosomatic component as well.  Please keep an eye on for any patterns that you notice with these events.  You are safe to go back to work.  With regard to driving: It sounds like I do not have the appropriate authority to supersede the neurologist.  I am going to send a message to your neurologist to ask about your specific situation if it is necessary to wait 6 months syncope free before returning to driving.  Anxiety/depression: You should be taking a total dose of 200 mg/day of sertraline.  To 100 in the morning and 100 in the evening.  Make sure you are taking that dose daily.  Please also reach out to one of the therapist below to establish care and to begin cognitive behavioral therapy for anxiety, depression or grief.  Outpatient Mental Health Providers (No Insurance required or Self Pay)  [facility]  [address] [place], [place] Crisis [phone]  [facility]) can see uninsured folks for outpatient therapy [email or web address] [address] [place], [place] [address] [phone]  [place] Mon-Fri, 8am-3pm [email or web address], [place], [place]   [place] [phone]- [place] [facility] for psych med management, there may be a wait- if [facility] is working with clients for OPT, they will coordinate with [place] for [place]   Walk-in-Clinic: Monday- Friday 9:00 AM - 4:00 PM [place], [place] [phone]  [facility] [place] ([employer]) walk in M-F 8am-12pm and  1pm-3pm [place]  [place]  Phone: [phone]  [employer] ([place] and substance challenges) [address], [place] [phone]    [email or web address], [place]     Phone:  [phone] [place]  [place]  [place]  [phone] [email or web address]   [facility] ( virtual or zoom therapy) [email or web address]  [place]  [place]    [facility] [phone]  grief counseling, dementia and caregiver support    [facility] Walk-in MWF 12:30 to 3:00     [place] [address]  [phone]  [email or web address] call to schedule an appointment    [place] ,Support group, Peer support services, [address] [place], [place] [address] [phone]- [phone]  [email or web address]           National [facility] on Mental Illness (NAMI) [place]- Wellness classes, Support groups        [address], [place], [place] [address] [phone]   CurrentJokes.cz   [facility]  (Psycho-social Rehabilitation clubhouse, Individual and group therapy) [address] [place], [place] [address]   [phone]  24- Hour Availability:  *[place] or [phone] * [facility] [place] (Domestic Violence, Rape, etc. )[phone] *  [facility] [phone] or [phone] [facility] [employer] [phone] [phone] (after hours) *[facility] Mobile Crisis Unit [phone] *[place] National Suicide Hotline [phone] Diamantina Monks)

## 2020-04-20 NOTE — Patient Instructions (Signed)
Medication Instructions:  The current medical regimen is effective;  continue present plan and medications as directed. Please refer to the Current Medication list given to you today. *If you need a refill on your cardiac medications before your next appointment, please call your pharmacy*  Special Instructions PLEASE INCREASE PHYSICAL ACTIVITY AS TOLERATED  PLEASE PURCHASE AND WEAR COMPRESSION STOCKINGS DAILY AND OFF AT BEDTIME. Compression stockings are elastic socks that squeeze the legs. They help to increase blood flow to the legs and to decrease swelling in the legs from fluid retention, and reduce the chance of developing blood clots in the lower legs. Please put on in the AM when dressing and off at night when dressing for bed. PLEASE MAKE SURE TO ELEVATE YOUR LEGS WHILE SITTING, THIS WILL HELP WITH THE SWELLING ALSO.  MAINTAIN HYDRATION  PLEASE READ AND FOLLOW HEART HEALTHY DIET ATTACHED  Follow-Up: Your next appointment:  3 month(s)  In Person with Peter Martinique, MD  At [facility], you and your health needs are our priority.  As part of our continuing mission to provide you with exceptional heart care, we have created designated Provider Care Teams.  These Care Teams include your primary Cardiologist (physician) and Advanced Practice Providers (APPs -  Physician Assistants and Nurse Practitioners) who all work together to provide you with the care you need, when you need it.   HEART HEALTHY DIET Getting too much fat and cholesterol in your diet may cause health problems. Choosing the right foods helps keep your fat and cholesterol at normal levels. This can keep you from getting certain diseases.  What are tips for following this plan? Meal planning  At meals, divide your plate into four equal parts: ? Fill one-half of your plate with vegetables and green salads. ? Fill one-fourth of your plate with whole grains. ? Fill one-fourth of your plate with low-fat (lean) protein  foods.  Eat fish that is high in omega-3 fats at least two times a week. This includes mackerel, tuna, sardines, and salmon.  Eat foods that are high in fiber, such as whole grains, beans, apples, broccoli, carrots, peas, and barley. General tips   Work with your doctor to lose weight if you need to.  Avoid: ? Foods with added sugar. ? Fried foods. ? Foods with partially hydrogenated oils.  Limit alcohol intake to no more than 1 drink a day for nonpregnant women and 2 drinks a day for men. One drink equals 12 oz of beer, 5 oz of wine, or 1 oz of hard liquor. Reading food labels  Check food labels for: ? Trans fats. ? Partially hydrogenated oils. ? Saturated fat (g) in each serving. ? Cholesterol (mg) in each serving. ? Fiber (g) in each serving.  Choose foods with healthy fats, such as: ? Monounsaturated fats. ? Polyunsaturated fats. ? Omega-3 fats.  Choose grain products that have whole grains. Look for the word "whole" as the first word in the ingredient list. Cooking  Cook foods using low-fat methods. These include baking, boiling, grilling, and broiling.  Eat more home-cooked foods. Eat at restaurants and buffets less often.  Avoid cooking using saturated fats, such as butter, cream, palm oil, palm kernel oil, and coconut oil. Recommended foods  Fruits  All fresh, canned (in natural juice), or frozen fruits. Vegetables  Fresh or frozen vegetables (raw, steamed, roasted, or grilled). Green salads. Grains  Whole grains, such as whole wheat or whole grain breads, crackers, cereals, and pasta. Unsweetened oatmeal, bulgur, barley,  quinoa, or brown rice. Corn or whole wheat flour tortillas. Meats and other protein foods  Ground beef (85% or leaner), grass-fed beef, or beef trimmed of fat. Skinless chicken or [place]. Ground chicken or [place]. Pork trimmed of fat. All fish and seafood. Egg whites. Dried beans, peas, or lentils. Unsalted nuts or seeds. Unsalted canned  beans. Nut butters without added sugar or oil. Dairy  Low-fat or nonfat dairy products, such as skim or 1% milk, 2% or reduced-fat cheeses, low-fat and fat-free ricotta or cottage cheese, or plain low-fat and nonfat yogurt. Fats and oils  Tub margarine without trans fats. Light or reduced-fat mayonnaise and salad dressings. Avocado. Olive, canola, sesame, or safflower oils. The items listed above may not be a complete list of foods and beverages you can eat. Contact a dietitian for more information. Foods to avoid Fruits  Canned fruit in heavy syrup. Fruit in cream or butter sauce. Fried fruit. Vegetables  Vegetables cooked in cheese, cream, or butter sauce. Fried vegetables. Grains  White bread. White pasta. White rice. Cornbread. Bagels, pastries, and croissants. Crackers and snack foods that contain trans fat and hydrogenated oils. Meats and other protein foods  Fatty cuts of meat. Ribs, chicken wings, bacon, sausage, bologna, salami, chitterlings, fatback, hot dogs, bratwurst, and packaged lunch meats. Liver and organ meats. Whole eggs and egg yolks. Chicken and [place] with skin. Fried meat. Dairy  Whole or 2% milk, cream, half-and-half, and cream cheese. Whole milk cheeses. Whole-fat or sweetened yogurt. Full-fat cheeses. Nondairy creamers and whipped toppings. Processed cheese, cheese spreads, and cheese curds. Beverages  Alcohol. Sugar-sweetened drinks such as sodas, lemonade, and fruit drinks. Fats and oils  Butter, stick margarine, lard, shortening, ghee, or bacon fat. Coconut, palm kernel, and palm oils. Sweets and desserts  Corn syrup, sugars, honey, and molasses. Candy. Jam and jelly. Syrup. Sweetened cereals. Cookies, pies, cakes, donuts, muffins, and ice cream. The items listed above may not be a complete list of foods and beverages you should avoid. Contact a dietitian for more information. Summary  Choosing the right foods helps keep your fat and cholesterol at  normal levels. This can keep you from getting certain diseases.  At meals, fill one-half of your plate with vegetables and green salads.  Eat high-fiber foods, like whole grains, beans, apples, carrots, peas, and barley.  Limit added sugar, saturated fats, alcohol, and fried foods. This information is not intended to replace advice given to you by your health care provider. Make sure you discuss any questions you have with your health care provider. Document Revised: 04/21/2018 Document Reviewed: 05/05/2017 Elsevier Patient Education  [place].

## 2020-04-20 NOTE — Progress Notes (Signed)
    SUBJECTIVE:   CHIEF COMPLAINT / HPI:   Syncope work-up Andrew Huerta has been seen in clinic multiple times for syncope work-up.  During the course of the work-up, an MRI and EEG have been unremarkable.  He is also followed up with cardiology who performed an unremarkable echocardiogram and uneventful cardiac monitor.  There is no clear neurological or cardiovascular cause for his episodes.  He reports he continues to have episodes about 2 times per week.  These episodes seem to consist primarily of feeling weak and jittery.  He has not passed out or lost consciousness during these episodes.  He has not noticed any clear pattern of these episodes being related to activity or motion.  Early on in the work-up, he thought these episodes were only related to chest pain although that does not seem to always be the case.  PERTINENT  PMH / PSH: CAD, hypertension, heart failure, GERD, grief  OBJECTIVE:   BP 114/68   Pulse 82   Ht 5\' 7"  (1.702 m)   Wt 152 lb 2 oz (69 kg)   SpO2 98%   BMI 23.83 kg/m    General: Seated comfortably in the exam room.  No acute distress Respiratory: Breathing comfortably on room air.  No respiratory distress.   ASSESSMENT/PLAN:   Seizure-like activity [facility]) Per cardiology, no obvious cardiac cause.  Per neurology, no obvious neurological cause.  Most likely related to vasovagal issues, dehydration with a likely psychosomatic component.  Today, we discussed taking his Celexa every day and getting connected with therapy for anxiety/depression or grief. -Cleared to return to work -Celexa 100 mg twice daily -List of therapist provided  With regard to his restriction on driving, there does not seem to be any clear neurological or cardiac cause of his syncope.  I will reach out to neurology to see if they will lift his 70-month restriction on driving.  I do not see any clear reason why he cannot drive if he is not having seizure-like activity and does have a clear  prodrome which would provide appropriate warning for him in a driving situation.     Matilde Haymaker, MD [place]

## 2020-04-20 NOTE — Assessment & Plan Note (Signed)
Per cardiology, no obvious cardiac cause.  Per neurology, no obvious neurological cause.  Most likely related to vasovagal issues, dehydration with a likely psychosomatic component.  Today, we discussed taking his Celexa every day and getting connected with therapy for anxiety/depression or grief. -Cleared to return to work -Celexa 100 mg twice daily -List of therapist provided  With regard to his restriction on driving, there does not seem to be any clear neurological or cardiac cause of his syncope.  I will reach out to neurology to see if they will lift his 69-month restriction on driving.  I do not see any clear reason why he cannot drive if he is not having seizure-like activity and does have a clear prodrome which would provide appropriate warning for him in a driving situation.

## 2020-04-27 ENCOUNTER — Telehealth: Payer: Self-pay | Admitting: Family Medicine

## 2020-04-27 NOTE — Telephone Encounter (Signed)
Mr. Noxon was called and informed about the response from neurology.

## 2020-04-27 NOTE — Telephone Encounter (Signed)
-----   Message from Penni Bombard, MD sent at 04/26/2020  6:14 PM EDT ----- Regarding: RE: This patient would like to drive I would continue to follow the [place] DMV guidelines for unprovoked syncope, which is to wait until event free x 6 months.   -VRP ----- Message ----- From: Matilde Haymaker, MD Sent: 04/20/2020   6:01 PM EDT To: Penni Bombard, MD Subject: This patient would like to drive               Dr. Leta Baptist,  This patient was seen by you during a work-up for syncope/seizure.  By your office, he was advised to avoid driving until he had been episode free for 6 months.  During the neurological work-up, no evidence of true seizure activity was found but there was no revision of this recommendation.  He has been cleared by cardiology at this time, there does not seem to be a clear cardiac cause of any syncopal symptoms.  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  Your recommendations prior to work-up are the sole reason that he avoids driving at this time.This is still necessary for him to avoid driving for another 6 months?Thank you for your time.  Matilde Haymaker, MD

## 2020-05-11 ENCOUNTER — Other Ambulatory Visit: Payer: Self-pay | Admitting: Family Medicine

## 2020-05-11 DIAGNOSIS — G894 Chronic pain syndrome: Secondary | ICD-10-CM

## 2020-05-12 MED ORDER — HYDROCODONE-ACETAMINOPHEN 5-325 MG PO TABS: 1.0000 | ORAL_TABLET | Freq: Three times a day (TID) | ORAL | 0 refills | Status: DC | PRN

## 2020-06-11 ENCOUNTER — Other Ambulatory Visit: Payer: Self-pay | Admitting: Family Medicine

## 2020-06-11 DIAGNOSIS — G894 Chronic pain syndrome: Secondary | ICD-10-CM

## 2020-06-11 MED ORDER — HYDROCODONE-ACETAMINOPHEN 5-325 MG PO TABS: 1.0000 | ORAL_TABLET | Freq: Three times a day (TID) | ORAL | 0 refills | Status: DC | PRN

## 2020-07-10 ENCOUNTER — Other Ambulatory Visit: Payer: Self-pay | Admitting: Family Medicine

## 2020-07-10 DIAGNOSIS — G894 Chronic pain syndrome: Secondary | ICD-10-CM

## 2020-07-10 MED ORDER — HYDROCODONE-ACETAMINOPHEN 5-325 MG PO TABS: 1.0000 | ORAL_TABLET | Freq: Three times a day (TID) | ORAL | 0 refills | Status: DC | PRN

## 2020-07-21 NOTE — Progress Notes (Signed)
Cardiology Office Note   Date:  07/24/2020   ID:  Noboru, Bidinger 1965-10-04, MRN [id number]  PCP:  Matilde Haymaker, MD  Cardiologist:   Dejae Bernet Martinique, MD   Chief Complaint  Patient presents with  . Chest Pain  . Dizziness      History of Present Illness: Andrew Huerta is a 54 y.o. male who presents for follow up CAD. He has a hx of CAD,chronic back pain, DM 2, Barrett's esophagus, history of adrenal tumor s/padrenal gland resection at [facility],HTN,HLD, primary hyperaldosteronism s/padrenalectomy, andOSA. Patient had a abnormal stress test in 2015. Subsequent cardiac catheterization showed two-vessel disease. He underwent stenting of mid LAD and mid left circumflex. Due to recurrent chest discomfort, he had repeat cardiac catheterization later in the year that showed patent stents. He was readmitted in November 2015 for recurrent chest pain. Myoview was normal.Cardiaccatheterization was performed in July 2016 which showed patent stents.More recently, patient was admitted in May 2020 with vague chest discomfort. Troponin was negative. Echocardiogram obtained on 01/04/2019 showed EF 35 to 40%, new inferior wall motion abnormality. Cardiac catheterization performed on 01/04/2019 revealed a 99% mid RCA disease, 100% mid to distal RCA lesion with distal left to right collateralization from LAD, 40% ostial to proximal LAD disease, widely patent proximal LAD stent, 80% proximal to mid LAD lesion treated with a 2.5 x 18 mm resolute Onyx DES postdilated to 2.6 mm, patent ostial to proximal left circumflex stent.Lipid test obtained during the admission showed LDL of 136. Postprocedure, he was placed on aspirinandPlavix.   He was admitted on 04/11/2019 with nausea, vomiting, fatigue and the severe sweating. He was seen in the hospital, who felt this is unlikely ACS. It was suspected his nausea and vomiting is related to history of Barrett's esophagus. He was seen by lipid clinic  on 04/21/2019 for consideration of Repatha.He has continued to work at [employer]. He has been intolerant of Imdur and unable to afford Ranexa.   In the last few months he has had continued episodes of feeling hot, sweaty, lightheaded, tunnel vision. This culminated in an episode on 02/21/2020 where he was at a restaurant, felt hot and lightheaded, sweating, drifted down with tunnel vision and passed out. He had some brief convulsions per girlfriend. No tongue biting or incontinence. When he woke up he had the sudden urge to defecate. He was able to go to the bathroom. Afterwards he felt tired and slept at home. He was seen by Neuro and MRI and EEG ordered. Instructed not to drive for 6 months.    He wore a 30-day cardiac event monitor which showed normal sinus rhythm, rare PACs occasional PVCs burden 4%. Echo showed some improvement in his EF to 40-45%.   An MRI of his brain showed  No acute findings were noted but chronic ischemic changes were seen.  Nothing to suggest reason for lightheadedness/syncopal episodes.  On follow up today he has no further syncope. He still has symptoms of extreme lightheadedness particularly when bending over. He has chest pain but its location has moved more lateral and lower in the chest. He complains of HAs. Has numbness and weird sensation in hands and feet.     Past Medical History:  Diagnosis Date  . Adrenal tumor    a. s/p adrenal gland resection at [facility]. left side removal per patient  . Anxiety   . Barrett's esophagus    EGD - 11/27/09 - short segment of Barrett's  .  CAD (coronary artery disease)    a. 04/27/14 [place] s/p DES to mLAD and DES to mLCx  . Chronic back pain    upper and lower per patient  . Chronic chest pain   . Degenerative joint disease    Bilateral knees. Significant knee pain since playing football in high school. also in back per patient  . Depression   . Diabetes mellitus type 2, controlled ([place])  08/29/2015   Diagnosed by A1c 7.6% during 08/26/15 hospital admission for chest pain  . Gastroesophageal reflux disease with hiatal hernia   . GERD (gastroesophageal reflux disease)   . Hiatal hernia    EGD - 11/27/2009  . Hyperlipidemia   . Hypertension   . Low back pain   . Primary hyperaldosteronism ([place]) 01/22/2012   S/P adrenalectomy     . PTSD (post-traumatic stress disorder)   . Seizures ([place])   . Sleep apnea   . Stone, kidney   . Syncope 04/10/2019    Past Surgical History:  Procedure Laterality Date  . ADRENALECTOMY  10/2013  . CARDIAC CATHETERIZATION  05/01/2014   Patent stents, other disease unchanged  . CARDIAC CATHETERIZATION  04/27/2014   Procedure: CORONARY STENT INTERVENTION;  Surgeon: Portia Wisdom M Martinique, MD;  Location: [facility] CATH LAB;  Service: Cardiovascular;;  DES mid Cx  DES mid LAD  . CARDIAC CATHETERIZATION N/A 03/22/2015   Procedure: Left Heart Cath and Coronary Angiography;  Surgeon: Sherren Mocha, MD;  Location: [place] [facility] CV LAB;  Service: Cardiovascular;  Laterality: N/A;  . CORONARY ANGIOPLASTY WITH STENT PLACEMENT  04/27/2014   3.0 x 16 mm Promus DES to the mid LAD and 3.5 x 28 mm Promus to the mid LCx, otherwise 20-30 percent lesions, EF 55%  . CORONARY STENT INTERVENTION N/A 01/04/2019   Procedure: CORONARY STENT INTERVENTION;  Surgeon: Troy Sine, MD;  Location: [place] CV LAB;  Service: Cardiovascular;  Laterality: N/A;  . KIDNEY STONE SURGERY  X 1  . KNEE ARTHROPLASTY Right 1984  . KNEE ARTHROSCOPY Right X 6  . LEFT HEART CATH AND CORONARY ANGIOGRAPHY N/A 01/04/2019   Procedure: LEFT HEART CATH AND CORONARY ANGIOGRAPHY;  Surgeon: Troy Sine, MD;  Location: [place] CV LAB;  Service: Cardiovascular;  Laterality: N/A;  . LEFT HEART CATHETERIZATION WITH CORONARY ANGIOGRAM N/A 04/27/2014   Procedure: LEFT HEART CATHETERIZATION WITH CORONARY ANGIOGRAM;  Surgeon: Memory Heinrichs M Martinique, MD;  Location[facility] CATH LAB;  Service: Cardiovascular;  Laterality: N/A;   . LEFT HEART CATHETERIZATION WITH CORONARY ANGIOGRAM N/A 05/01/2014   Procedure: LEFT HEART CATHETERIZATION WITH CORONARY ANGIOGRAM;  Surgeon: Burnell Blanks, MD;  Location: [facility] CATH LAB;  Service: Cardiovascular;  Laterality: N/A;  . LITHOTRIPSY  X 2     Current Outpatient Medications  Medication Sig Dispense Refill  . aspirin EC 81 MG tablet Take 81 mg by mouth daily.    Marland Kitchen atorvastatin (LIPITOR) 80 MG tablet TAKE ONE TABLET BY MOUTH DAILY 30 tablet 5  . clopidogrel (PLAVIX) 75 MG tablet TAKE ONE TABLET BY MOUTH DAILY 30 tablet 2  . diclofenac Sodium (VOLTAREN) 1 % GEL Apply 2 g topically 4 (four) times daily. 100 g 2  . glipiZIDE (GLUCOTROL) 5 MG tablet Take 1 tablet (5 mg total) by mouth daily. 90 tablet 3  . HYDROcodone-acetaminophen (NORCO/VICODIN) 5-325 MG tablet Take 1-2 tablets by mouth every 8 (eight) hours as needed for moderate pain. 80 tablet 0  . metFORMIN (GLUCOPHAGE-XR) 500 MG 24 hr tablet TAKE ONE TABLET BY  MOUTH DAILY WITH BREAKFAST X 1 WEEK IF TOLERATED WELL INCREASE TO 1 EVERY MORNING AND 1 EVERY EVENING 60 tablet 3  . nitroGLYCERIN (NITROSTAT) 0.4 MG SL tablet Place 1 tablet (0.4 mg total) under the tongue every 5 (five) minutes as needed for chest pain. 100 tablet 3  . ondansetron (ZOFRAN ODT) 4 MG disintegrating tablet Take 1 tablet (4 mg total) by mouth every 8 (eight) hours as needed for nausea or vomiting. 20 tablet 0  . pantoprazole (PROTONIX) 40 MG tablet TAKE ONE TABLET BY MOUTH DAILY 90 tablet 2  . sertraline (ZOLOFT) 100 MG tablet Take 2 tablets (200 mg total) by mouth daily. 30 tablet 3  . sildenafil (VIAGRA) 50 MG tablet Take 1 tablet (50 mg total) by mouth as needed for erectile dysfunction. 20 tablet 3   No current facility-administered medications for this visit.    Allergies:   Ozempic (0.25 or 0.5 mg-dose) [semaglutide(0.25 or 0.5mg -dos)], Cortisone acetate, Darvocet [propoxyphene n-acetaminophen], Nsaids, Xanax [alprazolam], and Tape    Social  History:  The patient  reports that he has never smoked. He has never used smokeless tobacco. He reports current alcohol use. He reports current drug use. Drug: Marijuana.   Family History:  The patient's family history includes Cancer in his father; Diabetes in his father and paternal grandmother; Heart attack in his paternal grandfather.    ROS:  Please see the history of present illness.   Otherwise, review of systems are positive for none.   All other systems are reviewed and negative.    PHYSICAL EXAM: VS:  BP 105/69   Pulse 77   Ht 5\' 7"  (1.702 m)   Wt 153 lb 6.4 oz (69.6 kg)   SpO2 98%   BMI 24.03 kg/m  , BMI Body mass index is 24.03 kg/m. GEN: Well nourished, well developed, in no acute distress  HEENT: normal  Neck: no JVD, carotid bruits, or masses Cardiac: RRR; no murmurs, rubs, or gallops,no edema  Respiratory:  clear to auscultation bilaterally, normal work of breathing GI: soft, nontender, nondistended, + BS MS: no deformity or atrophy  Skin: warm and dry, no rash Neuro:  Strength and sensation are intact Psych: euthymic mood, full affect   EKG:  EKG is ordered today. The ekg ordered today demonstrates NSR with PVCs. Possible old inferior infarct. I have personally reviewed and interpreted this study.    Recent Labs: 02/22/2020: ALT 17; BUN 17; Creatinine, Ser 0.94; Hemoglobin 13.6; Platelets 206; Potassium 4.4; Sodium 136    Lipid Panel    Component Value Date/Time   CHOL 251 (H) 04/12/2019 0427   CHOL 235 (H) 10/01/2018 0912   TRIG 190 (H) 04/12/2019 0427   HDL 44 04/12/2019 0427   HDL 55 10/01/2018 0912   CHOLHDL 5.7 04/12/2019 0427   VLDL 38 04/12/2019 0427   LDLCALC 169 (H) 04/12/2019 0427   LDLCALC 152 (H) 10/01/2018 0912   LDLDIRECT 169 (H) 07/08/2011 0944      Wt Readings from Last 3 Encounters:  07/24/20 153 lb 6.4 oz (69.6 kg)  04/20/20 152 lb 2 oz (69 kg)  04/20/20 152 lb 3.2 oz (69 kg)      Other studies Reviewed: Additional  studies/ records that were reviewed today include:   Cath 01/04/2019  Mid RCA lesion is 99% stenosed.  Mid RCA to Dist RCA lesion is 100% stenosed.  Ost LAD to Prox LAD lesion is 40% stenosed.  Previously placed Prox LAD stent (unknown type) is widely patent.  Prox LAD to Mid LAD lesion is 80% stenosed.  A stent was successfully placed.  Post intervention, there is a 0% residual stenosis.  Post intervention, there is a 0% residual stenosis.  Mid LAD lesion is 30% stenosed.  Previously placed Ost Cx to Prox Cx stent (unknown type) is widely patent.  Multivessel CAD with 40% smooth ostial proximal stenosis of the LAD with a septal perforating artery that arises from the ostium and has a 90% stenosis (not able to be depicted in the diagram), widely patent stent proximally followed by new 80% stenosis the on the stented segment and proximal to the mid diagonal vessels. There is 30% LAD stenosis beyond the mid diagonal vessels; patent moderate-sized ramus intermediate vessel; patent proximal left circumflex stent; and diffuse 99% to 100% occlusion of the mid distal RCA with evidence for moderate left to right collateralization from the LAD, septal perforating arteries, and LCX.  LVEDP 17 mmHg.  Successful PCI to the 80% LAD stenosis with ultimate insertion of a 2.5 x 18 mm Resolute Onyx DES stent postdilated to 2.6 mm with the 80% stenosis being reduced to 0%.  RECOMMENDATION: DAPT for minimum of 1 year and possibly long-term due to the patient's significant CAD history. Aggressive lipid-lowering therapy with target LDL less than 70. Medical therapy for RCA occlusion with collateralization.  Echo 03/01/20: IMPRESSIONS    1. Left ventricular ejection fraction, by estimation, is 40 to 45%. The  left ventricle has mildly decreased function. The left ventricle  demonstrates regional wall motion abnormalities (see scoring  diagram/findings for description). The left ventricular    internal cavity size was mildly dilated. Left ventricular diastolic  parameters are consistent with Grade I diastolic dysfunction (impaired  relaxation). There is severe hypokinesis of the left ventricular,  basal-mid inferoseptal wall and inferior wall.  2. Right ventricular systolic function is normal. The right ventricular  size is normal.  3. The mitral valve is normal in structure. Trivial mitral valve  regurgitation.  4. The aortic valve is normal in structure. Aortic valve regurgitation is  not visualized.  5. The inferior vena cava is normal in size with greater than 50%  respiratory variability, suggesting right atrial pressure of 3 mmHg.   Comparison(s): Prior images reviewed side by side. The left ventricular  function has improved.   Event monitor 04/09/20: Study Highlights   Normal sinus rhythm  Very rare PACs  Occasional PVCs. burden 4%    ASSESSMENT AND PLAN:   1. CAD: Last cardiac catheterization wasinMay 2020 with stenting of the RCA and mid LAD. Prior stenting of the LCx and LAD in the past. Diffuse small vessel disease. Continue aspirin and Plavix. He has stable angina. Unable to tolerate nitrates. Can't afford Ranexa. Current chest pain is atypical.   2.  Hypertension: Blood pressure stable   3. Hyperlipidemia: On Lipitor 80 mg daily. Still not at goal LDL. Would be a good candidate for Repatha but needs financial assistance.  Unable to afford.   4. DM2: Managed by primary care provider.  5. Syncope versus seizure. Neuro work up in progress with MRI and EEG. Echo showed EF improved to  40-45%. Event monitor showed occ. PVCs. Nothing that would explain syncope. I suspect he has some degree of autonomic dysfunction related to his diabetes. I have recommended he stop taking Coreg to see if his symptoms improve. He is on no other medication that would impact his BP.   6. Ischemic cardiomyopathy. EF 40-45%. Asymptomatic. Currently unable to titrate  medical therapy due to lightheadedness.    Current medicines are reviewed at length with the patient today.  The patient does not have concerns regarding medicines.  The following changes have been made:  Stop Coreg  Labs/ tests ordered today include:  No orders of the defined types were placed in this encounter.    Disposition:   FU with me in 6 months  Signed, Landy Mace Martinique, MD  07/24/2020 2:33 PM    [facility] [address], [place], [place], [address] Phone [phone], Fax [phone]

## 2020-07-24 ENCOUNTER — Other Ambulatory Visit: Payer: Self-pay

## 2020-07-24 ENCOUNTER — Encounter: Payer: Self-pay | Admitting: Cardiology

## 2020-07-24 ENCOUNTER — Ambulatory Visit (INDEPENDENT_AMBULATORY_CARE_PROVIDER_SITE_OTHER): Payer: Self-pay | Admitting: Cardiology

## 2020-07-24 VITALS — BP 105/69 | HR 77 | Ht 67.0 in | Wt 153.4 lb

## 2020-07-24 DIAGNOSIS — E118 Type 2 diabetes mellitus with unspecified complications: Secondary | ICD-10-CM

## 2020-07-24 DIAGNOSIS — R55 Syncope and collapse: Secondary | ICD-10-CM

## 2020-07-24 DIAGNOSIS — E785 Hyperlipidemia, unspecified: Secondary | ICD-10-CM

## 2020-07-24 DIAGNOSIS — I25118 Atherosclerotic heart disease of native coronary artery with other forms of angina pectoris: Secondary | ICD-10-CM

## 2020-07-24 DIAGNOSIS — E1149 Type 2 diabetes mellitus with other diabetic neurological complication: Secondary | ICD-10-CM

## 2020-08-03 ENCOUNTER — Other Ambulatory Visit: Payer: Self-pay | Admitting: Student

## 2020-08-13 ENCOUNTER — Other Ambulatory Visit: Payer: Self-pay | Admitting: Family Medicine

## 2020-08-13 DIAGNOSIS — G894 Chronic pain syndrome: Secondary | ICD-10-CM

## 2020-08-13 MED ORDER — HYDROCODONE-ACETAMINOPHEN 5-325 MG PO TABS: 1.0000 | ORAL_TABLET | Freq: Three times a day (TID) | ORAL | 0 refills | Status: DC | PRN

## 2020-09-11 ENCOUNTER — Other Ambulatory Visit: Payer: Self-pay | Admitting: Family Medicine

## 2020-09-11 DIAGNOSIS — G894 Chronic pain syndrome: Secondary | ICD-10-CM

## 2020-09-11 MED ORDER — HYDROCODONE-ACETAMINOPHEN 5-325 MG PO TABS: 1.0000 | ORAL_TABLET | Freq: Three times a day (TID) | ORAL | 0 refills | Status: DC | PRN

## 2020-10-12 ENCOUNTER — Other Ambulatory Visit: Payer: Self-pay | Admitting: Family Medicine

## 2020-10-12 DIAGNOSIS — G894 Chronic pain syndrome: Secondary | ICD-10-CM

## 2020-10-12 MED ORDER — HYDROCODONE-ACETAMINOPHEN 5-325 MG PO TABS: 1.0000 | ORAL_TABLET | Freq: Three times a day (TID) | ORAL | 0 refills | Status: DC | PRN

## 2020-11-08 ENCOUNTER — Other Ambulatory Visit: Payer: Self-pay | Admitting: Family Medicine

## 2020-11-08 DIAGNOSIS — G894 Chronic pain syndrome: Secondary | ICD-10-CM

## 2020-11-08 MED ORDER — HYDROCODONE-ACETAMINOPHEN 5-325 MG PO TABS: 1.0000 | ORAL_TABLET | Freq: Three times a day (TID) | ORAL | 0 refills | Status: DC | PRN

## 2020-11-13 ENCOUNTER — Ambulatory Visit: Payer: Self-pay | Admitting: Diagnostic Neuroimaging

## 2020-11-26 ENCOUNTER — Other Ambulatory Visit: Payer: Self-pay

## 2020-11-26 MED ORDER — METFORMIN HCL ER 500 MG PO TB24: ORAL_TABLET | ORAL | 3 refills | Status: DC

## 2020-12-08 ENCOUNTER — Other Ambulatory Visit: Payer: Self-pay | Admitting: Family Medicine

## 2020-12-08 DIAGNOSIS — G894 Chronic pain syndrome: Secondary | ICD-10-CM

## 2020-12-11 ENCOUNTER — Other Ambulatory Visit: Payer: Self-pay | Admitting: Family Medicine

## 2020-12-11 DIAGNOSIS — G894 Chronic pain syndrome: Secondary | ICD-10-CM

## 2020-12-11 MED ORDER — HYDROCODONE-ACETAMINOPHEN 5-325 MG PO TABS: 1.0000 | ORAL_TABLET | Freq: Three times a day (TID) | ORAL | 0 refills | Status: DC | PRN

## 2020-12-12 MED ORDER — HYDROCODONE-ACETAMINOPHEN 5-325 MG PO TABS: 1.0000 | ORAL_TABLET | Freq: Three times a day (TID) | ORAL | 0 refills | Status: DC | PRN

## 2020-12-12 NOTE — Addendum Note (Signed)
Addended by: Talbert Cage L on: 12/12/2020 03:23 PM   Modules accepted: Orders

## 2020-12-26 ENCOUNTER — Telehealth: Payer: Self-pay

## 2020-12-26 NOTE — Telephone Encounter (Signed)
Patient calls nurse line to discuss medication side effects of metformin. Patient reports that he has been having vomiting and diarrhea after taking metformin for the last week. Patient also reports intermittent dizziness. Patient alert and oriented and able to speak in complete sentences. Advised changing positions slowly and to increase PO fluids.   Advised patient that he should see provider to evaluate concerns. Scheduled with PCP tomorrow morning at 8:30. Strict ED precautions given.   Talbot Grumbling, RN

## 2020-12-27 ENCOUNTER — Other Ambulatory Visit: Payer: Self-pay

## 2020-12-27 ENCOUNTER — Ambulatory Visit (INDEPENDENT_AMBULATORY_CARE_PROVIDER_SITE_OTHER): Payer: Self-pay | Admitting: Family Medicine

## 2020-12-27 ENCOUNTER — Encounter: Payer: Self-pay | Admitting: Family Medicine

## 2020-12-27 VITALS — BP 115/60 | HR 75 | Ht 67.0 in | Wt 154.4 lb

## 2020-12-27 DIAGNOSIS — Z1159 Encounter for screening for other viral diseases: Secondary | ICD-10-CM

## 2020-12-27 DIAGNOSIS — R5383 Other fatigue: Secondary | ICD-10-CM

## 2020-12-27 DIAGNOSIS — E119 Type 2 diabetes mellitus without complications: Secondary | ICD-10-CM

## 2020-12-27 DIAGNOSIS — F332 Major depressive disorder, recurrent severe without psychotic features: Secondary | ICD-10-CM

## 2020-12-27 DIAGNOSIS — R42 Dizziness and giddiness: Secondary | ICD-10-CM

## 2020-12-27 LAB — POCT GLYCOSYLATED HEMOGLOBIN (HGB A1C): HbA1c, POC (controlled diabetic range): 10 % — AB (ref 0.0–7.0)

## 2020-12-27 MED ORDER — SERTRALINE HCL 100 MG PO TABS: 200.0000 mg | ORAL_TABLET | Freq: Every day | ORAL | 3 refills | Status: DC

## 2020-12-27 MED ORDER — GLIPIZIDE 5 MG PO TABS: 5.0000 mg | ORAL_TABLET | Freq: Every day | ORAL | 3 refills | Status: DC

## 2020-12-27 NOTE — Assessment & Plan Note (Signed)
He continues to note passive SI, as he has previously.  He reports that he has no plan and feels very strongly that he would not, in fact, hurt himself.  Today we discussed establishing care with a therapist which she was not particularly interested in.  He was encouraged to continue to consider this option. -Increase sertraline to 200 mg daily -Therapy resources provided -Follow-up in 1 month

## 2020-12-27 NOTE — Patient Instructions (Signed)
It was great to see you today.  Here is a quick review of the things we talked about:   Diabetes: Your A1c is significantly worse today.  You would really benefit from having health insurance, I would open up some additional options for [place] for medication for diabetes.  I would like you to try taking your metformin again if you are willing to give it a test to see if it still gives you the stomach upset.  Because you are able to tolerate it for a long time without stomach upset, I am optimistic he will be able to take it again.  If metformin is not tolerable, I like you to start taking glipizide 10 mg daily.  I would like to see you again in clinic in the next 1-2 months to follow-up on diabetes.  Depression/mood: Our 2 options are increasing your medication and having you see a therapist.  Please start taking 200 mg (2 pills) of your Zoloft.  I will attach some information below about establishing care with a therapist if you are interested.  Dizziness/lightheadedness/head pain: I am suspicious that you may have some autonomic instability.  This means that your body has trouble making adjustments to positional changes.  I would like you to be seen by neurology to see if there is any further assessment or treatment they can offer to help with some of your symptoms.   If all of your labs are normal, I will send you a message over my chart or send you a letter.  If there is anything to discuss, I will give you a phone call.  Outpatient Mental Health Providers (No Insurance required or Self Pay)  [facility]  [phone] [email or web address] [place] [facility] and [facility]  [address] [place], [place] [facility] [phone]  [facility]) can see uninsured folks for outpatient therapy [email or web address] [address] [place], [place] [address] [phone]  [place] Mon-Fri, 8am-3pm [email or web address], [place], [place]   [place] [phone]- [place] [facility] for psych med management, there may be a wait- if [facility] is working with clients for OPT, they will coordinate with [place] for [place]   Walk-in-Clinic: Monday- Friday 9:00 AM - 4:00 PM [place], [place] [phone]  [facility] the [place] ([employer]) walk in M-F 8am-12pm and  1pm-3pm [place]- [place]  [place]  Phone: [phone]  [employer] ([place] and substance challenges) [address], [place] [phone]    [email or web address] of the [place], [place]     Phone:  [phone] [place]  [place]  [place]  [phone] TransportationAnalyst.gl   [facility] ( virtual or zoom therapy) [email or web address]  [place]  [place]    [facility] [phone]  grief counseling, dementia and caregiver support    [facility] Walk-in MWF 12:30 to 3:00     [place] [address]  [phone]  www.[email or web address] call to schedule an appointment    [place]  Wellness Classes ,Support group, Peer support services, [address], [place], [place] [address] [phone]- [phone]  [email or web address]           [facility] on Mental Illness (NAMI) [place]- Wellness classes, Support groups        [address] [place], [place] [address] [phone]   CurrentJokes.cz   [facility]  (Psycho-social Rehabilitation clubhouse, Individual and group therapy) [address] [place], [place] [address]   [phone]  24- Hour  Availability:  *[place] or [phone] * [facility] the [employer] (Domestic Violence, Rape, etc. )[phone] [place] [phone] or [phone] * [place] [phone] only) [phone] (after hours) *[facility] Mobile Crisis Unit [phone] *[place] National Suicide Hotline [phone] Diamantina Monks)

## 2020-12-27 NOTE — Progress Notes (Signed)
SUBJECTIVE:   CHIEF COMPLAINT / HPI:   Diabetes Current medication includes: -Metformin 500 mg twice daily -Glipizide 5 mg daily He reports that he has not been taking any diabetes medicine for the past month.  Though he has been taking metformin for several years, he thought that it was starting to give him a lot of stomach upset, nausea and vomiting.  Dizziness/occasional head pain Mr. Nill has a history of syncopal episodes that is previously been worked up with cardiology and neurology.  Since this extensive work-up, roughly 6 months ago, he seems to have been doing better.  In the past several weeks or months, he has noticed episodes of dizziness which is sometimes accompanied by a right-sided headache.  As far as he is aware, this can happen at anytime of day with any position.  He denies any sensation of the room spinning around him, rather that he feels a lack of balance.  He does not particularly notice this more when moving from a seated to standing position.  Mood disorder/depression Mr. Reckart reports that he feels like his mood has been fluctuating dramatically over the past several weeks.  He does not feel like he consistently stays in a depressed state but fluctuates very quickly throughout the day.  He notes that he will feel fine 1 moment and then all of a sudden be overcome with sadness and began tearing up.  He recounted an incident at work where he felt totally normal and was, in fact, having a good day when he turned around and felt all of a sudden overwhelmed with sadness and became tearful.  Overall, he does not report any new, significant stressors in his life although he does have the same, persistent stressful elements of caring for his family with a relatively low pain job.  He did circle a 1 for question on his PHQ-9 which is very typical for his depression assessments.  He notes that he occasionally has passive thoughts of harming himself although he has no plan  in mind.  He notes frequently that he would not do himself any harm due to his religious convictions.  He is currently taking Zoloft 100 mg daily.  PERTINENT  PMH / PSH: Hypertension, CAD, heart failure, GERD, MDD  OBJECTIVE:   BP 115/60   Pulse 75   Ht 5\' 7"  (1.702 m)   Wt 154 lb 6.4 oz (70 kg)   SpO2 98%   BMI 24.18 kg/m    Orthostatic VS for the past 72 hrs (Last 3 readings):  Orthostatic BP Orthostatic Pulse  12/27/20 0905 130/90 74  12/27/20 0904 (!) 132/92 77  12/27/20 0903 137/83 92     General: Alert and cooperative and appears to be in no acute distress Cardio: Normal S1 and S2, no S3 or S4. Rhythm is regular. No murmurs or rubs.   Pulm: Clear to auscultation bilaterally, no crackles, wheezing, or diminished breath sounds. Normal respiratory effort Abdomen: Bowel sounds normal. Abdomen soft and non-tender.  Extremities: No peripheral edema. Warm/ well perfused.  Strong radial pulses. Neuro: Cranial nerves grossly intact  ASSESSMENT/PLAN:   MDD (major depressive disorder) He continues to note passive SI, as he has previously.  He reports that he has no plan and feels very strongly that he would not, in fact, hurt himself.  Today we discussed establishing care with a therapist which she was not particularly interested in.  He was encouraged to continue to consider this option. -Increase sertraline to 200  mg daily -Therapy resources provided -Follow-up in 1 month  Controlled type 2 diabetes mellitus without complication, without long-term current use of insulin (HCC) A1c 10 today compared to 8.8, 10 months ago.  This seems to be from him stopping his medications due to concern for adverse effects.  We discussed that he had previously tolerated metformin very well and he was willing to try to restart his metformin.  He was agreeable to attempting his metformin again and slowly titrating up his dose.  We will start with 500 mg and increase to 500 mg twice daily.  He was told  that if he cannot tolerate his metformin, he should start taking the glipizide that is previously been prescribed to him.  Secondly, few other options are open to [place] at this time due to his lack of insurance and difficult financial situation.  He was encouraged to figure out if he can still sign up for health insurance through his employer. -Reattempt metformin -If metformin is intolerable, start glipizide -Follow-up urine P/C -Follow-up in 1 month  Dizziness He has previously had issues with headaches and and dizziness and syncope.  His previous work-up for syncope was ultimately unrevealing and cardiology noted that it may be related to an autonomic instability.  I am suspicious that his current symptoms may be related to autonomic instability as well.  His orthostatic vitals did not show evidence of orthostatic hypotension here in the office.  He is not currently taking any antihypertensive medication.  I would like him to follow-up with neurology for further assessment of potential autonomic instability. - placed referral to neurology -Follow-up TSH     Matilde Haymaker, MD [place]

## 2020-12-27 NOTE — Assessment & Plan Note (Addendum)
A1c 10 today compared to 8.8, 10 months ago.  This seems to be from him stopping his medications due to concern for adverse effects.  We discussed that he had previously tolerated metformin very well and he was willing to try to restart his metformin.  He was agreeable to attempting his metformin again and slowly titrating up his dose.  We will start with 500 mg and increase to 500 mg twice daily.  He was told that if he cannot tolerate his metformin, he should start taking the glipizide that is previously been prescribed to him.  Secondly, few other options are open to [place] at this time due to his lack of insurance and difficult financial situation.  He was encouraged to figure out if he can still sign up for health insurance through his employer. -Reattempt metformin -If metformin is intolerable, start glipizide -Follow-up urine P/C -Follow-up in 1 month

## 2020-12-27 NOTE — Assessment & Plan Note (Addendum)
He has previously had issues with headaches and and dizziness and syncope.  His previous work-up for syncope was ultimately unrevealing and cardiology noted that it may be related to an autonomic instability.  I am suspicious that his current symptoms may be related to autonomic instability as well.  His orthostatic vitals did not show evidence of orthostatic hypotension here in the office.  He is not currently taking any antihypertensive medication.  I would like him to follow-up with neurology for further assessment of potential autonomic instability. - placed referral to neurology -Follow-up TSH

## 2020-12-28 LAB — PROTEIN / CREATININE RATIO, URINE
Creatinine, Urine: 120.6 mg/dL
Protein, Ur: 13.1 mg/dL
Protein/Creat Ratio: 109 mg/g creat (ref 0–200)

## 2020-12-28 LAB — HEPATITIS C ANTIBODY: Hep C Virus Ab: 0.2 s/co ratio (ref 0.0–0.9)

## 2020-12-28 LAB — TSH: TSH: 1.08 u[IU]/mL (ref 0.450–4.500)

## 2021-01-07 ENCOUNTER — Other Ambulatory Visit: Payer: Self-pay | Admitting: Family Medicine

## 2021-01-08 ENCOUNTER — Other Ambulatory Visit: Payer: Self-pay | Admitting: Family Medicine

## 2021-01-08 DIAGNOSIS — G894 Chronic pain syndrome: Secondary | ICD-10-CM

## 2021-01-09 MED ORDER — HYDROCODONE-ACETAMINOPHEN 5-325 MG PO TABS: 1.0000 | ORAL_TABLET | Freq: Three times a day (TID) | ORAL | 0 refills | Status: DC | PRN

## 2021-01-25 ENCOUNTER — Telehealth: Payer: Self-pay

## 2021-01-25 NOTE — Telephone Encounter (Signed)
-----   Message from Matilde Haymaker, MD sent at 01/24/2021 10:34 AM EDT ----- Regarding: No neurology follow-up Please call Mr. Kozlov and let him know that neurology has reviewed his files and they recommend continue follow-up with cardiology.  Based on his previous work-up, they do not suspect that there is any additional interventions that neurology can provide at this time.   Matilde Haymaker, MD  ----- Message ----- From: Lind Covert, MD Sent: 01/21/2021   9:41 AM EDT To: Matilde Haymaker, MD   ----- Message ----- From: Penni Bombard, MD Sent: 01/14/2021   2:08 PM EDT To: Lind Covert, MD, Morton Amy, #  Angie,   I was asked by Dr. Doreene Burke to see this patient one more time even though he has bad debt. I told him I would approve a 1 time visit.   I reviewed his chart again and his results (MRI, EEG) so far. I think he probably has diabetic autonomic dysfunction leading to syncope attacks. I would recommend cardiology follow up.   -VRP

## 2021-01-25 NOTE — Telephone Encounter (Signed)
Spoke with pt on 5/26. He informed me that neurology wanted $800 dollars to be seen and he wasn't going to do that. He said he would just wait until he sees Dr. Pilar Plate on 6/1. Salvatore Marvel, CMA

## 2021-01-30 ENCOUNTER — Ambulatory Visit: Payer: Self-pay | Admitting: Family Medicine

## 2021-02-08 ENCOUNTER — Other Ambulatory Visit: Payer: Self-pay | Admitting: Family Medicine

## 2021-02-08 DIAGNOSIS — G894 Chronic pain syndrome: Secondary | ICD-10-CM

## 2021-02-08 MED ORDER — HYDROCODONE-ACETAMINOPHEN 5-325 MG PO TABS: 1.0000 | ORAL_TABLET | Freq: Three times a day (TID) | ORAL | 0 refills | Status: DC | PRN

## 2021-02-08 NOTE — Telephone Encounter (Signed)
I am precepting and was asked to refill this medication as Dr. Baxter Flattery prescribing technology is not working correctly. Requested Dr. Pilar Plate call patient to assess risks/benefits of continuing medication as this has not recently been documented in his chart. PDMP reviewed with appropriate findings. Will send in rx.  Leeanne Rio, MD

## 2021-02-08 NOTE — Progress Notes (Signed)
Mr. Schnabel was called to discuss his chronic opioid medication.  Mr. Laura has been getting monthly refills of Norco 5 for several years now.  Since I began seeing Mr. Noah, we have been able to decrease slightly his opioid use.  We discussed that chronic opioid use can lead to significant adverse effects and there multiple reasons why we try to use alternate pain control options.  Mr. Cerda reports that he is familiar with his conversation and has tried multiple modalities in the past.  These have not provided significant relief for his multiple musculoskeletal concerns.  He is not interested in any additional therapeutic options at this time.  Additionally, he reports that he had some violent episodes of vomiting following consumption of bananas and he was concerned about a potassium allergy.  He denied any hives, lip swelling, throat itching, obvious signs of hypotension.  He was informed that a potassium allergy is quite unlikely that he may have had a reaction to the particular batch of bananas.  He was instructed not to avoid potassium or potassium containing medications at this time.  Matilde Haymaker, MD

## 2021-02-25 ENCOUNTER — Ambulatory Visit
Admission: RE | Admit: 2021-02-25 | Discharge: 2021-02-25 | Disposition: A | Discharge status: Home / Self Care | Payer: Self-pay | Source: Ambulatory Visit | Attending: Family Medicine | Admitting: Family Medicine

## 2021-02-25 ENCOUNTER — Other Ambulatory Visit: Payer: Self-pay

## 2021-02-25 ENCOUNTER — Ambulatory Visit (HOSPITAL_COMMUNITY)
Admission: RE | Admit: 2021-02-25 | Discharge: 2021-02-25 | Disposition: A | Discharge status: Home / Self Care | Payer: Self-pay | Source: Ambulatory Visit | Attending: Family Medicine | Admitting: Family Medicine

## 2021-02-25 ENCOUNTER — Ambulatory Visit (INDEPENDENT_AMBULATORY_CARE_PROVIDER_SITE_OTHER): Payer: Self-pay | Admitting: Family Medicine

## 2021-02-25 ENCOUNTER — Telehealth: Payer: Self-pay | Admitting: Cardiology

## 2021-02-25 ENCOUNTER — Encounter: Payer: Self-pay | Admitting: Family Medicine

## 2021-02-25 ENCOUNTER — Ambulatory Visit: Payer: Self-pay

## 2021-02-25 VITALS — BP 116/89 | HR 86 | Ht 67.0 in | Wt 147.4 lb

## 2021-02-25 DIAGNOSIS — R55 Syncope and collapse: Secondary | ICD-10-CM | POA: Insufficient documentation

## 2021-02-25 DIAGNOSIS — R0789 Other chest pain: Secondary | ICD-10-CM

## 2021-02-25 DIAGNOSIS — M79645 Pain in left finger(s): Secondary | ICD-10-CM | POA: Insufficient documentation

## 2021-02-25 DIAGNOSIS — M79644 Pain in right finger(s): Secondary | ICD-10-CM | POA: Insufficient documentation

## 2021-02-25 LAB — GLUCOSE, POCT (MANUAL RESULT ENTRY): POC Glucose: 155 mg/dl — AB (ref 70–99)

## 2021-02-25 NOTE — Assessment & Plan Note (Signed)
Tendons intact.  Obtain x-ray.

## 2021-02-25 NOTE — Assessment & Plan Note (Signed)
Likely related to fall.  Breath sounds symmetric.  Will obtain x-ray.  Use Tylenol for pain.  Discussed that would avoid stronger substances given his recent syncope.

## 2021-02-25 NOTE — Telephone Encounter (Signed)
Called pt regarding below message from PCP. Pt confirmed he is he feeling fine today other than feeling a little tired. Advised pt we don't have any appointments available this week but scheduled with Laurann Montana, NP on 7/8 @ 8:30 am. Pt also made aware of ED precaution should any new symptoms develop or worsen.

## 2021-02-25 NOTE — Patient Instructions (Signed)
Thank you for coming to see me today. It was a pleasure. Today we talked about:   We will get some labs today.  If they are abnormal or we need to do something about them, I will call you.  If they are normal, I will send you a message on MyChart (if it is active) or a letter in the mail.  If you don't hear from [place] in 2 weeks, please call the office at the number below.   I have called your cardiologist.  They will try to get you a sooner appointment.  Currently you are scheduled for 8/16 @ 1:45 pm, but they are going to see if they can get you in sooner by speaking with the nurses.    You should not drive given your recent syncope for at least 6 months.  If this occurs again, you should be seen at the hospital right away.  Avoid taking viagra during this time.  Please follow-up with PCP in 1 week.  If you have any questions or concerns, please do not hesitate to call the office at [phone].  Best,   [place] Constable, DO
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
## 2021-02-25 NOTE — Assessment & Plan Note (Signed)
Patient with prior episode of syncope, now with most recent last night, none since last year.  He does have a significant cardiac history, however has not had concern for cardiac involvement in regards to his syncope given his prior work-up, see above.  EKG today is unchanged.  Orthostatic vitals are negative.  He is not on any antihypertensives, although did take Viagra yesterday, could be contributory.  His CBG today is 155, less likely hypoglycemia given his uncontrolled history.  We will obtain a BMP and CBC today.  He has been unable to follow-up with neurology again given his outstanding balance and lack of medical insurance.  Feel that it is more urgent that he follows up with cardiology, given that he has a history of CAD with stent placement and has not had a cath since 2020.  Called cardiology, they are not able to see the patient until 8/14, but will send the nurses and physician a message to see if they can get him scheduled sooner.  We will have him follow-up with his new PCP in 1 week.  Advised patient that he should not drive given his syncopal episode.  Also advised against Viagra use.  Advised that if he has another episode, he should go to the emergency room immediately.

## 2021-02-25 NOTE — Telephone Encounter (Signed)
STAT if patient feels like he/she is going to faint   Are you dizzy now? no  Do you feel faint or have you passed out? Passed out last night  Do you have any other symptoms? no  Have you checked your HR and BP (record if available)? 116/89 HR 86  Patient's PCP, Dr. Loma Newton from [facility], states the patient had a syncopal episode last night. She states he has not passed out since. She states he was in their office today and was not dizzy and had negative for orthostatic vitals. She states his ekg was unchanged. She would like the patient called back. He is scheduled 8/16, but she would like him seen this week. I did not see anything sooner available.

## 2021-02-26 LAB — BASIC METABOLIC PANEL
BUN/Creatinine Ratio: 14 (ref 9–20)
BUN: 14 mg/dL (ref 6–24)
CO2: 23 mmol/L (ref 20–29)
Calcium: 9.4 mg/dL (ref 8.7–10.2)
Chloride: 104 mmol/L (ref 96–106)
Creatinine, Ser: 0.99 mg/dL (ref 0.76–1.27)
Glucose: 144 mg/dL — ABNORMAL HIGH (ref 65–99)
Potassium: 4.3 mmol/L (ref 3.5–5.2)
Sodium: 141 mmol/L (ref 134–144)
eGFR: 91 mL/min/{1.73_m2} (ref >59)

## 2021-02-26 LAB — CBC WITH DIFFERENTIAL/PLATELET
Basophils Absolute: 0.1 10*3/uL (ref 0.0–0.2)
Basos: 0 %
EOS (ABSOLUTE): 0.1 10*3/uL (ref 0.0–0.4)
Eos: 1 %
Hematocrit: 40.6 % (ref 37.5–51.0)
Hemoglobin: 14 g/dL (ref 13.0–17.7)
Immature Grans (Abs): 0.1 10*3/uL (ref 0.0–0.1)
Immature Granulocytes: 1 %
Lymphocytes Absolute: 2.4 10*3/uL (ref 0.7–3.1)
Lymphs: 21 %
MCH: 30.4 pg (ref 26.6–33.0)
MCHC: 34.5 g/dL (ref 31.5–35.7)
MCV: 88 fL (ref 79–97)
Monocytes Absolute: 0.7 10*3/uL (ref 0.1–0.9)
Monocytes: 6 %
Neutrophils Absolute: 8.2 10*3/uL — ABNORMAL HIGH (ref 1.4–7.0)
Neutrophils: 71 %
Platelets: 234 10*3/uL (ref 150–450)
RBC: 4.61 x10E6/uL (ref 4.14–5.80)
RDW: 12 % (ref 11.6–15.4)
WBC: 11.6 10*3/uL — ABNORMAL HIGH (ref 3.4–10.8)

## 2021-03-04 NOTE — Progress Notes (Signed)
SUBJECTIVE:   CHIEF COMPLAINT / HPI:   Follow-up-syncope: Patient is a 55 year old male presenting for follow-up on syncope. EKG with normal axis, per chart review patient was evaluated in our office on 02/25/2021 for syncope.  He was noted to have had a significant cardiac history.  His EKG that day was unchanged with negative orthostatic vitals.  He is not on any hypertensive as of that time.  He states he did take Viagra before the syncopal event which could have been contributory.  They did reach out to cardiology at that time who is going see the patient on 8/14 but attempt to see him sooner.  Today he states Saturday he got hot and felt nauseated and passed out. He states he had walked about 3 hours that day and didn't have much to eat that day. He doesn't think he remembers feeling faint right before passing out. He states sometimes he does feel faint before passing out. He sit up and passed back out, he says he didn't remember feeling faint when sitting up either. He denies confusion after passing out but states those around him said he was "talking nonsense". He said a friend who was with him thinks maybe he had some hand movements when he passed out. He is not sure how long he was out. He did not bite his tongue or have incontinence. He thinks it was a combination of the heat and low food intake that. 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   Hyperlipidemia: Takes atorvastatin 80 mg daily.  Last lipid panel in 2020 with LDL of 169.  History of hypertension: No current home medications.  Patient is currently awaiting getting in with cardiology to follow-up on syncope.  Blood pressure appropriate today  PERTINENT  PMH / PSH: Patient reports having at least 2 MIs in the past  OBJECTIVE:   BP 115/64   Pulse 92   Ht 5\' 7"  (1.702 m)   Wt 148 lb 6 oz (67.3 kg)   SpO2 98%   BMI 23.24 kg/m    General: NAD, pleasant, able to participate in exam Cardiac: RRR, no murmurs. Respiratory: CTAB,  normal effort, No wheezes, rales or rhonchi Extremities: no edema or cyanosis. Skin: warm and dry, no rashes noted Neuro: alert, no obvious focal deficits Psych: Normal affect and mood  ASSESSMENT/PLAN:   Hyperlipidemia Will check lipid panel today.  Continue atorvastatin 80 mg daily.  Syncope Patient presenting for follow-up on syncope.  He did have a syncopal episode this Saturday after spending all day out in the sun and not eating much and hiking he states for about 3 hours.  History is a bit unclear, he states he felt no confusion after the event but states his friend said he was "talking nonsense".  He did not lose any bowel or bladder function.  He does have a documented history of seizures but does not think this was a seizure.  He did not check his blood glucose.  Overall etiology can include arrhythmia versus hypoglycemia versus seizure versus orthostatics.  He did have an appointment last week and had orthostatics checked which were normal.  Did have an EKG at that time.  He does have follow-up with cardiology in 3 days. -Recommended he not drive until he is fully evaluated to we can figure out the cause of his syncopal episodes -Recommend he keep his appointment with cardiology in 3 days -Recommend if he has any further syncopal events that he goes to  the emergency department to be evaluated -Recommend they keep his glucometer close with him and if he has another similar episode he should check his glucose as this may be a cause.    Will have patient follow-up in about 3 weeks for diabetic labs including a urine microalbumin, A1c, and diabetic foot exam.  [name], [place]

## 2021-03-04 NOTE — Patient Instructions (Signed)
It was great to see you! Thank you for allowing me to participate in your care!  I recommend that you always bring your medications to each appointment as this makes it easy to ensure we are on the correct medications and helps [place] not miss when refills are needed.  Our plans for today:  -I am sorry you are still having these syncopal events.  If you have another one and it is possible to check your blood sugar I think that would give [place] some good information.  I do recommend that she go to the emergency department if you have another one, for safety. -We are going to check some labs today -I recommend that you do not drive until you see cardiology.  They will probably send you out with a patch to monitor for arrhythmias. -I would like to see you back in about 3 weeks to recheck your A1c.  We are checking your cholesterol levels today.  We are checking some labs today, I will call you if they are abnormal will send you a MyChart message or a letter if they are normal.  If you do not hear about your labs in the next 2 weeks please let us know.  Take care and seek immediate care sooner if you develop any concerns.   Dr. [name], [place]

## 2021-03-05 ENCOUNTER — Other Ambulatory Visit: Payer: Self-pay

## 2021-03-05 ENCOUNTER — Ambulatory Visit (INDEPENDENT_AMBULATORY_CARE_PROVIDER_SITE_OTHER): Payer: Self-pay | Admitting: Family Medicine

## 2021-03-05 ENCOUNTER — Encounter: Payer: Self-pay | Admitting: Family Medicine

## 2021-03-05 VITALS — BP 115/64 | HR 92 | Ht 67.0 in | Wt 148.4 lb

## 2021-03-05 DIAGNOSIS — R55 Syncope and collapse: Secondary | ICD-10-CM

## 2021-03-05 DIAGNOSIS — E785 Hyperlipidemia, unspecified: Secondary | ICD-10-CM

## 2021-03-05 NOTE — Assessment & Plan Note (Signed)
Will check lipid panel today.  Continue atorvastatin 80 mg daily.

## 2021-03-05 NOTE — Assessment & Plan Note (Signed)
Patient presenting for follow-up on syncope.  He did have a syncopal episode this Saturday after spending all day out in the sun and not eating much and hiking he states for about 3 hours.  History is a bit unclear, he states he felt no confusion after the event but states his friend said he was "talking nonsense".  He did not lose any bowel or bladder function.  He does have a documented history of seizures but does not think this was a seizure.  He did not check his blood glucose.  Overall etiology can include arrhythmia versus hypoglycemia versus seizure versus orthostatics.  He did have an appointment last week and had orthostatics checked which were normal.  Did have an EKG at that time.  He does have follow-up with cardiology in 3 days. -Recommended he not drive until he is fully evaluated to we can figure out the cause of his syncopal episodes -Recommend he keep his appointment with cardiology in 3 days -Recommend if he has any further syncopal events that he goes to the emergency department to be evaluated -Recommend they keep his glucometer close with him and if he has another similar episode he should check his glucose as this may be a cause.

## 2021-03-06 LAB — LIPID PANEL
Chol/HDL Ratio: 4.4 ratio (ref 0.0–5.0)
Cholesterol, Total: 203 mg/dL — ABNORMAL HIGH (ref 100–199)
HDL: 46 mg/dL (ref >39)
LDL Chol Calc (NIH): 130 mg/dL — ABNORMAL HIGH (ref 0–99)
Triglycerides: 151 mg/dL — ABNORMAL HIGH (ref 0–149)
VLDL Cholesterol Cal: 27 mg/dL (ref 5–40)

## 2021-03-08 ENCOUNTER — Encounter (HOSPITAL_BASED_OUTPATIENT_CLINIC_OR_DEPARTMENT_OTHER): Payer: Self-pay | Admitting: Family

## 2021-03-08 ENCOUNTER — Ambulatory Visit (INDEPENDENT_AMBULATORY_CARE_PROVIDER_SITE_OTHER): Payer: Self-pay | Admitting: Family

## 2021-03-08 ENCOUNTER — Other Ambulatory Visit: Payer: Self-pay

## 2021-03-08 VITALS — BP 118/80 | HR 85 | Ht 67.0 in | Wt 146.8 lb

## 2021-03-08 DIAGNOSIS — I10 Essential (primary) hypertension: Secondary | ICD-10-CM

## 2021-03-08 DIAGNOSIS — I25118 Atherosclerotic heart disease of native coronary artery with other forms of angina pectoris: Secondary | ICD-10-CM

## 2021-03-08 DIAGNOSIS — R079 Chest pain, unspecified: Secondary | ICD-10-CM

## 2021-03-08 DIAGNOSIS — E118 Type 2 diabetes mellitus with unspecified complications: Secondary | ICD-10-CM

## 2021-03-08 DIAGNOSIS — R55 Syncope and collapse: Secondary | ICD-10-CM

## 2021-03-08 DIAGNOSIS — I255 Ischemic cardiomyopathy: Secondary | ICD-10-CM

## 2021-03-08 DIAGNOSIS — E785 Hyperlipidemia, unspecified: Secondary | ICD-10-CM

## 2021-03-08 NOTE — Progress Notes (Signed)
Office Visit    Patient Name: Andrew Huerta Date of Encounter: 03/08/2021  PCP:  Andrew Huerta, Andrew Huerta  Cardiologist:  Andrew Martinique, MD  Advanced Practice Provider:  No care team Huerta to display Electrophysiologist:  None    Chief Complaint    Andrew Huerta is a 55 y.o. male with a hx of CAD, HTN, ICM, syncope, HLD, chronic back pain, DM2, Barrett's esophagus, adrenal tumor s/p adrenal gland resection at [facility], HTN, HLD, primary hyperaldosteronism s/p adrenalectomy, OSA presents today for syncopal episode   Past Medical History    Past Medical History:  Diagnosis Date   Adrenal tumor    a. s/p adrenal gland resection at [facility]. left side removal per patient   Anxiety    Barrett's esophagus    EGD - 11/27/09 - short segment of Barrett's   CAD (coronary artery disease)    a. 04/27/14 [place] s/p DES to mLAD and DES to mLCx   Chronic back pain    upper and lower per patient   Chronic chest pain    Degenerative joint disease    Bilateral knees. Significant knee pain since playing football in high school. also in back per patient   Depression    Diabetes mellitus type 2, controlled ([place]) 08/29/2015   Diagnosed by A1c 7.6% during 08/26/15 hospital admission for chest pain   Gastroesophageal reflux disease with hiatal hernia    GERD (gastroesophageal reflux disease)    Hiatal hernia    EGD - 11/27/2009   Hyperlipidemia    Hypertension    Low back pain    Primary hyperaldosteronism ([place]) 01/22/2012   S/P adrenalectomy      PTSD (post-traumatic stress disorder)    Seizures ([place])    Sleep apnea    Stone, kidney    Syncope 04/10/2019   Past Surgical History:  Procedure Laterality Date   ADRENALECTOMY  10/2013   CARDIAC CATHETERIZATION  05/01/2014   Patent stents, other disease unchanged   CARDIAC CATHETERIZATION  04/27/2014   Procedure: CORONARY STENT INTERVENTION;  Surgeon: Andrew M Martinique, MD;  Location: [facility] CATH LAB;  Service:  Cardiovascular;;  DES mid Cx  DES mid LAD   CARDIAC CATHETERIZATION N/A 03/22/2015   Procedure: Left Heart Cath and Coronary Angiography;  Surgeon: Sherren Mocha, MD;  Location: [place] CV LAB;  Service: Cardiovascular;  Laterality: N/A;   CORONARY ANGIOPLASTY WITH STENT PLACEMENT  04/27/2014   3.0 x 16 mm Promus DES to the mid LAD and 3.5 x 28 mm Promus to the mid LCx, otherwise 20-30 percent lesions, EF 55%   CORONARY STENT INTERVENTION N/A 01/04/2019   Procedure: CORONARY STENT INTERVENTION;  Surgeon: Troy Sine, MD;  Location: [place] CV LAB;  Service: Cardiovascular;  Laterality: N/A;   KIDNEY STONE SURGERY  X 1   KNEE ARTHROPLASTY Right 1984   KNEE ARTHROSCOPY Right X 6   LEFT HEART CATH AND CORONARY ANGIOGRAPHY N/A 01/04/2019   Procedure: LEFT HEART CATH AND CORONARY ANGIOGRAPHY;  Surgeon: Troy Sine, MD;  Location: [place] CV LAB;  Service: Cardiovascular;  Laterality: N/A;   LEFT HEART CATHETERIZATION WITH CORONARY ANGIOGRAM N/A 04/27/2014   Procedure: LEFT HEART CATHETERIZATION WITH CORONARY ANGIOGRAM;  Surgeon: Andrew M Martinique, MD;  Location: [facility] CATH LAB;  Service: Cardiovascular;  Laterality: N/A;   LEFT HEART CATHETERIZATION WITH CORONARY ANGIOGRAM N/A 05/01/2014   Procedure: LEFT HEART CATHETERIZATION WITH CORONARY ANGIOGRAM;  Surgeon: Burnell Blanks, MD;  Location: [place] CATH LAB;  Service: Cardiovascular;  Laterality: N/A;   LITHOTRIPSY  X 2    Allergies  Allergies  Allergen Reactions   Ozempic (0.25 Or 0.5 Mg-Dose) [Semaglutide(0.25 Or 0.5mg -Dos)] Nausea And Vomiting and Other (See Comments)    Also had skin reaction with first injection.  Tolerated second shot dermatologically.  GI intolerance lead to > 5 lb. weight loss in two weeks.     Cortisone Acetate Swelling    Swelling at injection site   Darvocet [Propoxyphene N-Acetaminophen] Nausea And Vomiting   Nsaids Nausea And Vomiting and Other (See Comments)    Caused stomach ulcers in the past     Xanax [Alprazolam] Other (See Comments)    Pt states causes him to be mean, irritable   Tape Itching and Rash    History of Present Illness    [name] is a 55 y.o. male with a hx of CAD, HTN, ICM, syncope, HLD, chronic back pain, DM2, Barrett's esophagus, adrenal tumor s/p adrenal gland resection at [facility], HTN, HLD, primary hyperaldosteronism s/p adrenalectomy, OSA last seen 07/24/20.  He had abnormal stress test in 2015 with subseuqent cardiac catheterization showing two-vessel disease with stenting of mid LAD and mid LCx. Repeat catheterization later that year due to chest pain with patent stents. November 2015 admission with normal myoview. Cardiac catheterization July 2016 with patent stents. Admission May 2020 with chest discomfort. Echo 01/04/19 LVEF 35-40%, new inferior wall motion abnormality. Cardiac cath showed 99% mid RCA disease, 100% mid ost to prox LAD disease, patent prox LAD stent, 80% prox to mid LAD lesion treated with Onyx DES and patent LCx stent. Lipid with LDL 136 at that time. He was discharged on Aspirin and Plavix.   Previous intolerance to Imdur and unable to afford Ranexa. Repatha has been considered in the past but was cost prohibitive.   When seen 07/2020 by Dr. Martinique it was noted that over the past several months he had episodes of feeling hot, sweaty, lightheaded, and tunnel vision. He had syncopal episode 02/21/20. Also noted brief convulsions per girlfriend. He was seen by neurology with MRI and EEG ordered. MRI with chronic ischemic changes but Andrew acute findings. He wore 30-day cardiac event monitor which showed normal sinus rhythm, rare PAC's, occasional PVC with burden 4%. Echocardiogram showed improvement in LVEF to 40-45%. At follow up in November he noted Andrew recurrent syncope but lightheadedness particularly with position changes. His Carvedilol was discontinued.Some degree of autonomic dysfunction related to his diabetes was thought to be contributory.    His primary care reached out 02/25/21 as he was evaluated in their office for a syncopal episode. His orthostatic vitals were negative and EKG unchanged. Of note, did take viagra prior to the event. He had repeat syncopal episode 03/02/21 after getting hot and feeling nauseous. Of note he had been out of the sun and not eaten much. Though friend did state he was "talking nonsense". Did not bite tongue nor loss of bowel or bladder function. He did not check his blood sugar at that time.  He presents today for follow up. Notes chronic left sided chest pain under his armpit. Tells me it lasts a few minutes and self resolves.  Tells me this is overall unchanged.  Tells me both of his recent episodes of syncope occurred in the setting of not eating for several hours and being overheated. He tells me he felt lightheaded and dizzy as well as nauseous prior to the episode.  He  did not check blood pressure nor blood sugar during the episode.  Does note that his previous syncopal episodes last summer were in similar circumstances of not eating and being overheated.  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  He does drink approximately 3 bottles of propel throughout the day.  We discussed that not eating regular meals and not staying well-hydrated could contribute to syncope.  He does note orthostasis symptoms with positional changes.  EKGs/Labs/Other Studies Reviewed:   The following studies were reviewed today: Cath 01/04/2019 Mid RCA lesion is 99% stenosed. Mid RCA to Dist RCA lesion is 100% stenosed. Ost LAD to Prox LAD lesion is 40% stenosed. Previously placed Prox LAD stent (unknown type) is widely patent. Prox LAD to Mid LAD lesion is 80% stenosed. A stent was successfully placed. Post intervention, there is a 0% residual stenosis. Post intervention, there is a 0% residual stenosis. Mid LAD lesion is  30% stenosed. Previously placed Ost Cx to Prox Cx stent (unknown type) is widely patent.   Multivessel CAD with 40% smooth ostial proximal stenosis of the LAD with a septal perforating artery that arises from the ostium and has a 90% stenosis (not able to be depicted in the diagram), widely patent stent proximally followed by new 80% stenosis the on the stented segment and proximal to the mid diagonal vessels.  There is 30% LAD stenosis beyond the mid diagonal vessels; patent moderate-sized ramus intermediate vessel; patent proximal left circumflex stent; and diffuse 99% to 100% occlusion of the mid distal RCA with evidence for moderate left to right collateralization from the LAD, septal perforating arteries, and LCX.   LVEDP 17 mmHg.   Successful PCI to the 80% LAD stenosis with ultimate insertion of a 2.5 x 18 mm Resolute Onyx DES stent postdilated to 2.6 mm with the 80% stenosis being reduced to 0%.   RECOMMENDATION: DAPT for minimum of 1 year and possibly long-term due to the patient's significant CAD history.  Aggressive lipid-lowering therapy with target LDL less than 70.  Medical therapy for RCA occlusion with collateralization.   Echo 03/01/20: IMPRESSIONS     1. Left ventricular ejection fraction, by estimation, is 40 to 45%. The  left ventricle has mildly decreased function. The left ventricle  demonstrates regional wall motion abnormalities (see scoring  diagram/findings for description). The left ventricular   internal cavity size was mildly dilated. Left ventricular diastolic  parameters are consistent with Grade I diastolic dysfunction (impaired  relaxation). There is severe hypokinesis of the left ventricular,  basal-mid inferoseptal wall and inferior wall.   2. Right ventricular systolic function is normal. The right ventricular  size is normal.   3. The mitral valve is normal in structure. Trivial mitral valve  regurgitation.   4. The aortic valve is normal in structure.  Aortic valve regurgitation is  not visualized.   5. The inferior vena cava is normal in size with greater than 50%  respiratory variability, suggesting right atrial pressure of 3 mmHg.   Comparison(s): Prior images reviewed side by side. The left ventricular  function has improved.   Event monitor 04/09/20: Study Highlights   Normal sinus rhythm Very rare PACs Occasional PVCs. burden 4%  EKG: Andrew EKG is  ordered today.  The ekg ordered today demonstrates Sinus rhythm with occasional PVC 74bpm.  Recent Labs: 12/27/2020: TSH 1.080  02/25/2021: BUN 14; Creatinine, Ser 0.99; Hemoglobin 14.0; Platelets 234; Potassium 4.3; Sodium 141  Recent Lipid Panel    Component Value Date/Time   CHOL 203 (H) 03/05/2021 1653   TRIG 151 (H) 03/05/2021 1653   HDL 46 03/05/2021 1653   CHOLHDL 4.4 03/05/2021 1653   CHOLHDL 5.7 04/12/2019 0427   VLDL 38 04/12/2019 0427   LDLCALC 130 (H) 03/05/2021 1653   LDLDIRECT 169 (H) 07/08/2011 0944    Home Medications   Current Meds  Medication Sig   aspirin EC 81 MG tablet Take 81 mg by mouth daily.   atorvastatin (LIPITOR) 80 MG tablet TAKE ONE TABLET BY MOUTH DAILY   clopidogrel (PLAVIX) 75 MG tablet TAKE ONE TABLET BY MOUTH DAILY   diclofenac Sodium (VOLTAREN) 1 % GEL Apply 2 g topically 4 (four) times daily.   glipiZIDE (GLUCOTROL) 5 MG tablet Take 1 tablet (5 mg total) by mouth daily.   HYDROcodone-acetaminophen (NORCO/VICODIN) 5-325 MG tablet Take 1-2 tablets by mouth every 8 (eight) hours as needed for moderate pain.   metFORMIN (GLUCOPHAGE-XR) 500 MG 24 hr tablet TAKE ONE TABLET BY MOUTH DAILY WITH BREAKFAST X 1 WEEK IF TOLERATED WELL INCREASE TO 1 EVERY MORNING AND 1 EVERY EVENING   nitroGLYCERIN (NITROSTAT) 0.4 MG SL tablet Place 1 tablet (0.4 mg total) under the tongue every 5 (five) minutes as needed for chest pain.   ondansetron (ZOFRAN ODT) 4 MG disintegrating tablet Take 1 tablet (4 mg total) by mouth every 8 (eight) hours as needed for nausea or  vomiting.   pantoprazole (PROTONIX) 40 MG tablet TAKE ONE TABLET BY MOUTH DAILY   sertraline (ZOLOFT) 100 MG tablet Take 2 tablets (200 mg total) by mouth daily.   sildenafil (VIAGRA) 50 MG tablet Take 1 tablet (50 mg total) by mouth as needed for erectile dysfunction.     Review of Systems      All other systems reviewed and are otherwise negative except as noted above.  Physical Exam    VS:  BP 118/80   Pulse 85   Ht 5\' 7"  (1.702 m)   Wt 146 lb 12.8 oz (66.6 kg)   SpO2 97%   BMI 22.99 kg/m  , BMI Body mass index is 22.99 kg/m.  Wt Readings from Last 3 Encounters:  03/08/21 146 lb 12.8 oz (66.6 kg)  03/05/21 148 lb 6 oz (67.3 kg)  02/25/21 147 lb 6.4 oz (66.9 kg)    GEN: Well nourished, well developed, in Andrew acute distress. HEENT: normal. Neck: Supple, Andrew JVD, carotid bruits, or masses. Cardiac: RRR, Andrew murmurs, rubs, or gallops. Andrew clubbing, cyanosis, edema.  Radials/PT 2+ and equal bilaterally.  Respiratory:  Respirations regular and unlabored, clear to auscultation bilaterally. GI: Soft, nontender, nondistended. MS: Andrew deformity or atrophy. Skin: Warm and dry, Andrew rash. Neuro:  Strength and sensation are intact. Psych: Normal affect.  Assessment & Plan    CAD - Most recent cath 12/2018 with stenting of RCA and LAD. Prior stenting of LCx and LAD. Intolerant of nitrates. Reports stable anginal symptoms. GDMT includes Plavix, Atorvastatin, Aspirin. Andrew beta blocker due to hypotension.  HTN - Well controlled without antihypertensive agent. Now with symptoms consistent with orthostatic hypotension.    HLD - Continue Atorvastatin 80mg  QD. Unable to afford Repatha. Lipid lowering diet encouraged. Does endorse eating mostly hamburgers and fast food.   DM2 - Continue to follow with PCP.  Discussed the importance of eating regular meals to avoid hypoglycemic agents.  He has  not been checking his blood sugar at home.  Syncope vs seizure - Previous MRI and EEG 03/2020 unremarkable.   [name] monitor and echo monitor 03/2020 nonrevealing.  Anticipate his most recent episodes were orthostatic or vasovagal in the setting of not eating or drinking much and being overheated.  Plan for carotid duplex to rule out carotid artery stenosis as contributory as he has not had this evaluated previously.  Long discussion regarding the importance of staying well-hydrated, eating regular meals.  He will contact our office if he has recurrent syncope and we could consider repeat monitor or echo at that time.  Ischemic cardiomyopathy - GDMT limited by hypotension. Not presently on medical therapy. Heart healthy diet and regular cardiovascular exercise encouraged.  Euvolemic and well compensated on exam.   Disposition: Follow up in 4 month(s) with Dr. Martinique or APP.  Signed, Loel Dubonnet, NP 03/08/2021, 8:57 AM [place]

## 2021-03-08 NOTE — Patient Instructions (Addendum)
Medication Instructions:  Continue your current medications.   *If you need a refill on your cardiac medications before your next appointment, please call your pharmacy*  Lab Work: None ordered today   Testing/Procedures: Your physician has requested that you have a carotid duplex. This test is an ultrasound of the carotid arteries in your neck. It looks at blood flow through these arteries that supply the brain with blood. Allow one hour for this exam. There are no restrictions or special instructions.   Follow-Up: At [facility], you and your health needs are our priority.  As part of our continuing mission to provide you with exceptional heart care, we have created designated Provider Care Teams.  These Care Teams include your primary Cardiologist (physician) and Advanced Practice Providers (APPs -  Physician Assistants and Nurse Practitioners) who all work together to provide you with the care you need, when you need it.  We recommend signing up for the patient portal called "MyChart".  Sign up information is provided on this After Visit Summary.  MyChart is used to connect with patients for Virtual Visits (Telemedicine).  Patients are able to view lab/test results, encounter notes, upcoming appointments, etc.  Non-urgent messages can be sent to your provider as well.   To learn more about what you can do with MyChart, go to [email or web address].    Your next appointment:   4 month(s)  The format for your next appointment:   In Person  Provider:   You may see Peter Martinique, MD or one of the following Advanced Practice Providers on your designated Care Team:   Almyra Deforest, PA-C Fabian Sharp, [place] or  Roby Lofts, [place]   Other Instructions  Please stay well hydrated and eat regular meals.   Heart Healthy Diet Recommendations: A low-salt diet is recommended. Meats should be grilled, baked, or boiled. Avoid fried foods. Focus on lean protein sources like fish or chicken with  vegetables and fruits. The American Heart Association is a Microbiologist!  American Heart Association Diet and Lifeystyle Recommendations   Exercise recommendations: The American Heart Association recommends 150 minutes of moderate intensity exercise weekly. Try 30 minutes of moderate intensity exercise 4-5 times per week. This could include walking, jogging, or swimming.  Orthostatic Hypotension Blood pressure is a measurement of how strongly, or weakly, your blood is pressing against the walls of your arteries. Orthostatic hypotension is a sudden drop in blood pressure that happens when you quickly change positions,such as when you get up from sitting or lying down. Arteries are blood vessels that carry blood from your heart throughout your body. When blood pressure is too low, you may not get enough blood to your brain or to the rest of your organs. This can cause weakness, light-headedness, rapid heartbeat, and fainting. This can last for just a few seconds or for up to a few minutes. Orthostatic hypotension is usually not a serious problem. However, if it happens frequently or gets worse, it may be a sign of somethingmore serious. What are the causes? This condition may be caused by: Sudden changes in posture, such as standing up quickly after you have been sitting or lying down. Blood loss. Loss of body fluids (dehydration). Heart problems. Hormone (endocrine) problems. Pregnancy. Severe infection. Lack of certain nutrients. Severe allergic reactions (anaphylaxis). Certain medicines, such as blood pressure medicine or medicines that make the body lose excess fluids (diuretics). Sometimes, this condition can be caused by not taking medicine as directed, such as taking too much  of a certain medicine. What increases the risk? The following factors may make you more likely to develop this condition: Age. Risk increases as you get older. Conditions that affect the heart or the central  nervous system. Taking certain medicines, such as blood pressure medicine or diuretics. Being pregnant. What are the signs or symptoms? Symptoms of this condition may include: Weakness. Light-headedness. Dizziness. Blurred vision. Fatigue. Rapid heartbeat. Fainting, in severe cases. How is this diagnosed? This condition is diagnosed based on: Your medical history. Your symptoms. Your blood pressure measurement. Your health care provider will check your blood pressure when you are: Lying down. Sitting. Standing. A blood pressure reading is recorded as two numbers, such as "120 over 80" (or 120/80). The first ("top") number is called the systolic pressure. It is a measure of the pressure in your arteries as your heart beats. The second ("bottom") number is called the diastolic pressure. It is a measure of the pressure in your arteries when your heart relaxes between beats. Blood pressure is measured in a unit called mm Hg. Healthy blood pressure for most adults is 120/80. If your blood pressure is below 90/60, you may be diagnosed withhypotension. Other information or tests that may be used to diagnose orthostatic hypotension include: Your other vital signs, such as your heart rate and temperature. Blood tests. Tilt table test. For this test, you will be safely secured to a table that moves you from a lying position to an upright position. Your heart rhythm and blood pressure will be monitored during the test. How is this treated? This condition may be treated by: Changing your diet. This may involve eating more salt (sodium) or drinking more water. Taking medicines to raise your blood pressure. Changing the dosage of certain medicines you are taking that might be lowering your blood pressure. Wearing compression stockings. These stockings help to prevent blood clots and reduce swelling in your legs. In some cases, you may need to go to the hospital for: Fluid replacement. This means  you will receive fluids through an IV. Blood replacement. This means you will receive donated blood through an IV (transfusion). Treating an infection or heart problems, if this applies. Monitoring. You may need to be monitored while medicines that you are taking wear off. Follow these instructions at home: Eating and drinking  Drink enough fluid to keep your urine pale yellow. Eat a healthy diet, and follow instructions from your health care provider about eating or drinking restrictions. A healthy diet includes: Fresh fruits and vegetables. Whole grains. Lean meats. Low-fat dairy products. Eat extra salt only as directed. Do not add extra salt to your diet unless your health care provider told you to do that. Eat frequent, small meals. Avoid standing up suddenly after eating.  Medicines Take over-the-counter and prescription medicines only as told by your health care provider. Follow instructions from your health care provider about changing the dosage of your current medicines, if this applies. Do not stop or adjust any of your medicines on your own. General instructions  Wear compression stockings as told by your health care provider. Get up slowly from lying down or sitting positions. This gives your blood pressure a chance to adjust. Avoid hot showers and excessive heat as directed by your health care provider. Return to your normal activities as told by your health care provider. Ask your health care provider what activities are safe for you. Do not use any products that contain nicotine or tobacco, such as cigarettes, e-cigarettes,  and chewing tobacco. If you need help quitting, ask your health care provider. Keep all follow-up visits as told by your health care provider. This is important.  Contact a health care provider if you: Vomit. Have diarrhea. Have a fever for more than 2-3 days. Feel more thirsty than usual. Feel weak and tired. Get help right away if you: Have  chest pain. Have a fast or irregular heartbeat. Develop numbness in any part of your body. Cannot move your arms or your legs. Have trouble speaking. Become sweaty or feel light-headed. Faint. Feel short of breath. Have trouble staying awake. Feel confused. Summary Orthostatic hypotension is a sudden drop in blood pressure that happens when you quickly change positions. Orthostatic hypotension is usually not a serious problem. It is diagnosed by having your blood pressure taken lying down, sitting, and then standing. It may be treated by changing your diet or adjusting your medicines. This information is not intended to replace advice given to you by your health care provider. Make sure you discuss any questions you have with your healthcare provider. Document Revised: 02/11/2018 Document Reviewed: 02/11/2018 Elsevier Patient Education  [place].

## 2021-03-09 ENCOUNTER — Other Ambulatory Visit: Payer: Self-pay | Admitting: Family Medicine

## 2021-03-09 DIAGNOSIS — G894 Chronic pain syndrome: Secondary | ICD-10-CM

## 2021-03-11 ENCOUNTER — Other Ambulatory Visit: Payer: Self-pay | Admitting: Family Medicine

## 2021-03-11 DIAGNOSIS — G894 Chronic pain syndrome: Secondary | ICD-10-CM

## 2021-03-11 MED ORDER — HYDROCODONE-ACETAMINOPHEN 5-325 MG PO TABS: 1.0000 | ORAL_TABLET | Freq: Three times a day (TID) | ORAL | 0 refills | Status: DC | PRN

## 2021-03-12 ENCOUNTER — Other Ambulatory Visit: Payer: Self-pay

## 2021-03-12 ENCOUNTER — Ambulatory Visit (HOSPITAL_COMMUNITY)
Admission: RE | Admit: 2021-03-12 | Discharge: 2021-03-12 | Disposition: A | Discharge status: Home / Self Care | Payer: Self-pay | Source: Ambulatory Visit | Attending: Cardiovascular Disease | Admitting: Cardiovascular Disease

## 2021-03-12 DIAGNOSIS — N521 Erectile dysfunction due to diseases classified elsewhere: Secondary | ICD-10-CM

## 2021-03-12 DIAGNOSIS — R55 Syncope and collapse: Secondary | ICD-10-CM | POA: Insufficient documentation

## 2021-03-12 MED ORDER — SILDENAFIL CITRATE 50 MG PO TABS: 50.0000 mg | ORAL_TABLET | ORAL | 3 refills | Status: DC | PRN

## 2021-03-12 NOTE — Telephone Encounter (Signed)
Called patient to discuss his refill request for sildenafil.  He has a history of nitroglycerin tablets being prescribed on his chart.  He states that is been several years since he has had to take 1 of those and he had them in the past for chest pain.  I discussed with him the risk involved with taking these 2 medications together and the potential risk of taking a Viagra and having chest pain afterwards with the fact that if he were to have that happen and take a nitroglycerin it could potentially be fatal with hypotension.  Patient states he is well aware of these risk and does not use his nitroglycerin tablets and has not used them for years.  I discussed with him that I do feel comfortable sending in the sildenafil if this is the case and he seems very aware of the risk involved.  We will send in his refill request for sildenafil.

## 2021-03-24 NOTE — Progress Notes (Signed)
    SUBJECTIVE:   CHIEF COMPLAINT / HPI:   Diabetic Follow Up: Patient is a 55 y.o. male who present today for diabetic follow up.   Patient endorses  not taking medications due to GI upset  Home medications include: Metformin 500 twice daily, glipizide 5 mg daily Patient endorses NOT taking these medications as prescribed. Stopped his diabetes meds a few months ago. He is eating less.  Most recent A1Cs:  Lab Results  Component Value Date   HGBA1C 7.5 (A) 03/25/2021   HGBA1C 10.0 (A) 12/27/2020   HGBA1C 8.8 (A) 02/27/2020   Last Microalbumin, LDL, Creatinine: Lab Results  Component Value Date   MICROALBUR 0.2 05/30/2016   LDLCALC 130 (H) 03/05/2021   CREATININE 0.99 02/25/2021   Patient does not check blood glucose on a regular basis.  Patient is not up to date on diabetic eye. Patient is not up to date on diabetic foot exam.  Hyperlipidemia: Most recent lipid panel 03/05/2021 with LDL of 130, HDL 46.  Current medication includes atorvastatin 80 mg daily.  PERTINENT  PMH / PSH: CAD  OBJECTIVE:   BP 104/60   Pulse 89   Ht '5\' 7"'$  (1.702 m)   Wt 150 lb 9.6 oz (68.3 kg)   SpO2 96%   BMI 23.59 kg/m    Diabetic foot exam was performed.  No deformities or other abnormal visual findings.  Posterior tibialis and dorsalis pulse intact bilaterally.  Sensation: reduced on plantar surface of foot   General: NAD, pleasant, able to participate in exam Cardiac: S1, S2 present Respiratory: CTAB, normal effort, No wheezes, rales or rhonchi Extremities: no edema or cyanosis. Skin: warm and dry, no rashes noted Psych: Normal affect and mood  ASSESSMENT/PLAN:   Controlled type 2 diabetes mellitus without complication, without long-term current use of insulin (HCC) Previous A1c of 10.0, 7.5 today.  We will hold metformin as the patient is not currently taking it.  Recommend he continue his glipizide as this should not cause GI upset to the extent that metformin would.  We will  check urine microalbumin.  Recommend diabetic eye exam.  Diabetic foot exam performed today.  We will place CCM referral for social determinants of health with patient having difficulty affording healthcare items and is in need of colonoscopy, etc.  Hyperlipidemia Continue atorvastatin 80 mg daily, LDL not at goal.  Continues to follow with cardiology.   Medication management: Patient stopped all his medications due to a bout of nausea, vomiting, diarrhea.  Endorsed to him that he must take his aspirin, atorvastatin, Plavix due to his history of multiple MIs.  Recommended that he also take his glipizide as it should not cause GI upset to extend the metformin would.  Stated that I thought it would be okay to hold his metformin at this time.  [name], [place]

## 2021-03-25 ENCOUNTER — Other Ambulatory Visit: Payer: Self-pay

## 2021-03-25 ENCOUNTER — Ambulatory Visit (INDEPENDENT_AMBULATORY_CARE_PROVIDER_SITE_OTHER): Payer: Self-pay | Admitting: Family Medicine

## 2021-03-25 ENCOUNTER — Encounter: Payer: Self-pay | Admitting: Family Medicine

## 2021-03-25 VITALS — BP 104/60 | HR 89 | Ht 67.0 in | Wt 150.6 lb

## 2021-03-25 DIAGNOSIS — E119 Type 2 diabetes mellitus without complications: Secondary | ICD-10-CM

## 2021-03-25 DIAGNOSIS — E785 Hyperlipidemia, unspecified: Secondary | ICD-10-CM

## 2021-03-25 LAB — POCT GLYCOSYLATED HEMOGLOBIN (HGB A1C): HbA1c, POC (controlled diabetic range): 7.5 % — AB (ref 0.0–7.0)

## 2021-03-25 NOTE — Patient Instructions (Addendum)
Your A1c improved today from 10-7.5.  There are some of your meds that we really need to take.  These include aspirin, atorvastatin, Plavix.  You must take these meds or you risk having another heart attack or potentially a stroke.  I would like for you to continue your glipizide for diabetes.  This should not cause diarrhea like the metformin would.  I am going to place a referral for chronic care management, they should contact you in the next 1 week to set up the next steps.

## 2021-03-25 NOTE — Assessment & Plan Note (Signed)
Previous A1c of 10.0, 7.5 today.  We will hold metformin as the patient is not currently taking it.  Recommend he continue his glipizide as this should not cause GI upset to the extent that metformin would.  We will check urine microalbumin.  Recommend diabetic eye exam.  Diabetic foot exam performed today.  We will place CCM referral for social determinants of health with patient having difficulty affording healthcare items and is in need of colonoscopy, etc.

## 2021-03-25 NOTE — Assessment & Plan Note (Signed)
Continue atorvastatin 80 mg daily, LDL not at goal.  Continues to follow with cardiology.

## 2021-03-26 ENCOUNTER — Telehealth: Payer: Self-pay | Admitting: *Deleted

## 2021-03-26 LAB — MICROALBUMIN / CREATININE URINE RATIO
Creatinine, Urine: 308.9 mg/dL
Microalb/Creat Ratio: 6 mg/g creat (ref 0–29)
Microalbumin, Urine: 17.4 ug/mL

## 2021-03-26 NOTE — Telephone Encounter (Signed)
      Telephone encounter was:  Successful.  03/26/2021 Name: Andrew Huerta MRN: [id number] DOB: 07-16-1966  Andrew Huerta is a 55 y.o. year old male who is a primary care patient of [name], DO . The community resource team was consulted for assistance with    Care guide performed the following interventions: Patient provided with information about care guide support team and interviewed to confirm resource needs.  Follow Up Plan:  No further follow up planned at this time. The patient has been provided with needed resources.  [place], Care Management  [phone] [address] , [place] [address] Email : [email or web address]

## 2021-03-26 NOTE — Telephone Encounter (Signed)
   Telephone encounter was:  Unsuccessful.  03/26/2021 Name: CAIRON BOELTER MRN: [id number] DOB: 09-19-1965  Unsuccessful outbound call made today to assist with:  Patient needs insurance likely will not qualify for medicaid but can apply or else will need to buy his own through healthcare marketplace   Outreach Attempt:  1st Attempt  A HIPAA compliant voice message was left requesting a return call.  Instructed patient to call back at .  Instructed patient to call back at [phone]  at their earliest convenience. .  .j

## 2021-04-10 ENCOUNTER — Other Ambulatory Visit: Payer: Self-pay | Admitting: Family Medicine

## 2021-04-10 DIAGNOSIS — G894 Chronic pain syndrome: Secondary | ICD-10-CM

## 2021-04-11 MED ORDER — HYDROCODONE-ACETAMINOPHEN 5-325 MG PO TABS: 1.0000 | ORAL_TABLET | Freq: Three times a day (TID) | ORAL | 0 refills | Status: DC | PRN

## 2021-04-16 ENCOUNTER — Ambulatory Visit: Payer: Self-pay | Admitting: Physician Assistant

## 2021-05-10 ENCOUNTER — Other Ambulatory Visit: Payer: Self-pay | Admitting: Family Medicine

## 2021-05-10 DIAGNOSIS — G894 Chronic pain syndrome: Secondary | ICD-10-CM

## 2021-05-10 MED ORDER — HYDROCODONE-ACETAMINOPHEN 5-325 MG PO TABS: 1.0000 | ORAL_TABLET | Freq: Three times a day (TID) | ORAL | 0 refills | Status: DC | PRN

## 2021-06-07 ENCOUNTER — Other Ambulatory Visit: Payer: Self-pay | Admitting: Family Medicine

## 2021-06-07 DIAGNOSIS — G894 Chronic pain syndrome: Secondary | ICD-10-CM

## 2021-06-07 MED ORDER — HYDROCODONE-ACETAMINOPHEN 5-325 MG PO TABS: 1.0000 | ORAL_TABLET | Freq: Three times a day (TID) | ORAL | 0 refills | Status: DC | PRN

## 2021-07-04 ENCOUNTER — Other Ambulatory Visit: Payer: Self-pay | Admitting: Family Medicine

## 2021-07-04 DIAGNOSIS — G894 Chronic pain syndrome: Secondary | ICD-10-CM

## 2021-07-05 ENCOUNTER — Other Ambulatory Visit: Payer: Self-pay | Admitting: Family Medicine

## 2021-07-05 DIAGNOSIS — G894 Chronic pain syndrome: Secondary | ICD-10-CM

## 2021-07-05 MED ORDER — HYDROCODONE-ACETAMINOPHEN 5-325 MG PO TABS: 1.0000 | ORAL_TABLET | Freq: Three times a day (TID) | ORAL | 0 refills | Status: DC | PRN

## 2021-07-06 ENCOUNTER — Other Ambulatory Visit: Payer: Self-pay | Admitting: Family Medicine

## 2021-08-02 ENCOUNTER — Other Ambulatory Visit: Payer: Self-pay | Admitting: Family Medicine

## 2021-08-02 DIAGNOSIS — N521 Erectile dysfunction due to diseases classified elsewhere: Secondary | ICD-10-CM

## 2021-08-06 ENCOUNTER — Other Ambulatory Visit: Payer: Self-pay | Admitting: Family Medicine

## 2021-08-06 DIAGNOSIS — G894 Chronic pain syndrome: Secondary | ICD-10-CM

## 2021-08-06 MED ORDER — HYDROCODONE-ACETAMINOPHEN 5-325 MG PO TABS: 1.0000 | ORAL_TABLET | Freq: Three times a day (TID) | ORAL | 0 refills | Status: DC | PRN

## 2021-08-12 ENCOUNTER — Ambulatory Visit (INDEPENDENT_AMBULATORY_CARE_PROVIDER_SITE_OTHER): Payer: Self-pay | Admitting: Family Medicine

## 2021-08-12 ENCOUNTER — Encounter: Payer: Self-pay | Admitting: Family Medicine

## 2021-08-12 ENCOUNTER — Other Ambulatory Visit: Payer: Self-pay

## 2021-08-12 VITALS — BP 118/84 | HR 82 | Wt 147.6 lb

## 2021-08-12 DIAGNOSIS — R45851 Suicidal ideations: Secondary | ICD-10-CM

## 2021-08-12 DIAGNOSIS — M778 Other enthesopathies, not elsewhere classified: Secondary | ICD-10-CM | POA: Insufficient documentation

## 2021-08-12 HISTORY — DX: Other enthesopathies, not elsewhere classified: M77.8

## 2021-08-12 HISTORY — DX: Suicidal ideations: R45.851

## 2021-08-12 MED ORDER — DICLOFENAC SODIUM 1 % EX GEL: 2.0000 g | Freq: Four times a day (QID) | CUTANEOUS | 2 refills | Status: DC

## 2021-08-12 NOTE — Assessment & Plan Note (Signed)
Limited for PO options for anti-inflammatory medications  Will RX voltaren topical gel  Patient to follow up in 1 week  Patient encouraged to wear splint at night, loosely fit

## 2021-08-12 NOTE — Assessment & Plan Note (Signed)
Referral to chronic care management to connect with therapy services  Patient denies action but would like to have someone to discuss concerns for family dynamics and other social challenges  Patient given handout for SI resources and list for potential therapists

## 2021-08-12 NOTE — Patient Instructions (Addendum)
If you are feeling suicidal or depression symptoms worsen please immediately go to:   If you are thinking about harming yourself or having thoughts of suicide, or if you know someone who is, seek help right away. If you are in crisis, make sure you are not left alone.  If someone else is in crisis, make sure he/she/they is not left alone  Call 988 OR 1-800-273-TALK  24 Hour Availability for [place]  [address] [place], [place] Crisis [phone]    Other crisis resources:  Family Service of the [employer] (Domestic Violence, Rape & Victim Assistance [phone]  [facility] [place]    (ONLY from 8am-4pm)    [phone]  [facility] Mobile Crisis Unit (24/7)   [phone]  [place] National Suicide Hotline   (337) 779-5405 (TA   Extensor Pollicis Longus Tendinitis Tendinitis is inflammation of a tendon--the tissues that connect muscles to bone. The extensor pollicis longus (EPL) tendon is a tendon that runs from the mid-forearm to the mid-thumb area. This tendon helps to pull the thumb up and back. EPL tendinitis occurs when the lining (sheath) of the EPL tendon becomes irritated and swollen at the back of the wrist. This causes pain on the thumb side of the back of the wrist. What are the causes? This condition may be caused by: Overusing the muscle or tendon by doing activities that repeatedly cause your thumb and wrist to extend. A sudden increase in activity or a change in activity. A previous wrist injury. What increases the risk? You are more likely to develop this condition if: You have had a broken wrist or other wrist injury. You play sports or activities that involve repeated hand and wrist motions, such as tennis, golf, bowling, and gardening. You do heavy labor. You have poor wrist strength and flexibility. What are the signs or symptoms? Symptoms of  this condition include: Pain or tenderness over the thumb side of the back of the wrist when your thumb and wrist are not moving. Swelling over the affected area. Pain that gets worse when you straighten your thumb or wrist. Pain when you touch the affected area. Decreased thumb and wrist range of motion due to pain. How is this diagnosed? This condition may be diagnosed with: Your symptoms and medical history, including the details of any injury. A physical exam. An MRI. This imaging test will confirm the diagnosis. How is this treated? Treatment for this condition may include: Applying ice to the affected area. Taking medicines to lessen pain and swelling. Wearing a splint or brace to limit the movement of your thumb and wrist until your symptoms improve. Exercises to help you maintain movement in your thumb and wrist (physical therapy). Therapy to help with everyday activities (occupational therapy). Injection of an anti-inflammatory medicine (steroid) if the other treatments are not helping. Surgery. This is only needed in severe cases. Follow these instructions at home: If you have a splint or brace: Wear it as told by your health care provider. Remove it only as told by your health care provider. Loosen it if your fingers tingle, become numb, or turn cold and blue. Keep it clean. If it is not waterproof: Do not let it get wet. Cover it with a watertight covering when you take a bath or shower. Remove it for bathing and washing your hand only if instructed by your health care provider. Managing pain, stiffness, and swelling  If directed,  put ice on the injured area. If you have a removable splint or brace, remove it as told by your health care provider. Put ice in a plastic bag. Place a towel between your skin and the bag. Leave the ice on for 20 minutes, 2-3 times a day. Move your fingers often through a full range of motion to reduce stiffness and swelling. Raise (elevate)  the injured area above the level of your heart while you are sitting or lying down. Activity Rest the affected area as told by your health care provider. Return to your normal activities as told by your health care provider. Ask your health care provider what activities are safe for you. Do exercises as told by your health care provider. General instructions Take over-the-counter and prescription medicines only as told by your health care provider. Keep all follow-up visits as told by your health care provider. This is important. Contact a health care provider if: Your pain, tenderness, or swelling gets worse, even if you have had treatment. You have numbness or tingling in your wrist, hand, or fingers on the injured side. Get help right away if: Your pain is severe. You are unable to straighten the tip of your thumb using just your muscle. Summary EPL tendinitis occurs when the lining (sheath) of the EPL tendon becomes irritated and swollen at the back of the wrist. This causes pain on the thumb side of the back of the wrist. Treatment for this condition may include applying ice, taking medicines, wearing a splint or brace, and physical or occupational therapy. In severe cases, surgery may be needed. Take over-the-counter and prescription medicines only as told by your health care provider. Return to your normal activities as told by your health care provider. Ask your health care provider what activities are safe for you. This information is not intended to replace advice given to you by your health care provider. Make sure you discuss any questions you have with your health care provider. Document Revised: 12/14/2018 Document Reviewed: 10/11/2018 Elsevier Patient Education  2022 [place] Providers (No Insurance required or Self Pay)  [place] and Wellness Services  [phone] [email or web address] .[place] [facility]  [address] [place], [place] Crisis [phone]  [facility]) can see uninsured folks for outpatient therapy [email or web address]-[email or web address] [address] [place], [place]  [address] [phone]  [place] Mon-Fri, 8am-3pm [email or web address], [place], [place]   [place] [phone]- [place] [facility] for psych med management, there may be a wait- if [facility] is working with clients for OPT, they will coordinate with [place] for [place]   Walk-in-Clinic: Monday- Friday 9:00 AM - 4:00 PM [place], [place] (336) [phone]  Family Services of the [place] ([employer]) walk in M-F 8am-12pm and  1pm-3pm [place]- [place]  [place]  Phone: [phone]  [place] ([place] and substance challenges) [address], [place] [phone]    [email or web address], [place]     Phone:  [phone] [facility]-  [address]  [phone]   [place]  [place] [email or web address].gl   [facility] ( virtual or zoom therapy) strongminds@[email or web address] .[email or web address]  [place]  [place]    [facility] [phone]  grief counseling, dementia and caregiver support    [facility] Walk-in MWF 12:30 to 3:00     [place] [address]  [phone]  www.ADSyes.org call to schedule an appointment     National Alliance on Mental Illness (NAMI) [place]- Wellness classes, Support groups        [address], [place], [place] [address] [phone]   CurrentJokes.cz    [facility]  (Psycho-social Rehabilitation clubhouse, Individual and group therapy) [address],  [address]   [phone]  24- Hour Availability:  *[place] or 1-(229)[phone] * Family Service of the [employer] (Domestic Violence, Rape, etc. )[phone] [place] [phone] or [phone] * [place] ([phone] only) [phone] (after hours) *[facility] Mobile Crisis Unit [phone] *[place] National Suicide Hotline 863-772-7291 Diamantina Monks)

## 2021-08-12 NOTE — Progress Notes (Signed)
    SUBJECTIVE:   CHIEF COMPLAINT / HPI: unable to move thumb   Thumb Immobility  Patient reports that he had a syncopal episodes.  His last syncopal episode was 6-8 months ago and during the event, he hurt his right thumb in third finger.  He states that he has had no further falls or syncopal events since then.  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  He reports that Saturday morning, which was 2 days ago, he was unable to bend those fingers and they there was significant swelling.  He states that he was able to rest yesterday and today feels the movement has improved.  He denies any recent injuries or objects followed on exam.  He states that he does notice more incidences of his hands bilaterally "gone".  Patient states that he tried to ice his hands and fingers and also try to wear a brace while sleeping but the neuropathy symptoms caused him to remove the brace.  He reports a history of carpal tunnel and arthritis "all over".  He states that he is unable to take any medications for his arthritis.  He reports a severe allergy to anti-inflammatory medications.  PERTINENT  PMH / PSH:  Carpal tunnel Hypertension Osteoarthritis History of MI  OBJECTIVE:   BP 118/84   Pulse 82   Wt 147 lb 9.6 oz (67 kg)   SpO2 99%   BMI 23.12 kg/m   Hand: Inspection: No obvious deformity. Minimal swelling of right thumb and 3rd digit,no erythema nor bruising Palpation: + TTP of right thumb and third digit  ROM: Full ROM of the digits and wrist Strength: 4/5 strength in the forearm, wrist and interosseus muscles of right hand, 5/5 strength of left hand  Neurovascular: NV intact Special tests: + finkelstein's, + tinel's at the carpal tunnel on right ASSESSMENT/PLAN:   Tendonitis of right hand Limited for PO options for anti-inflammatory medications  Will RX voltaren topical gel  Patient to follow up in 1 week  Patient  encouraged to wear splint at night, loosely fit   Suicidal ideation Referral to chronic care management to connect with therapy services  Patient denies action but would like to have someone to discuss concerns for family dynamics and other social challenges  Patient given handout for SI resources and list for potential therapists      Eulis Foster, MD [place]

## 2021-08-13 ENCOUNTER — Telehealth: Payer: Self-pay | Admitting: *Deleted

## 2021-08-13 NOTE — Chronic Care Management (AMB) (Signed)
°  Care Management   Note  08/13/2021 Name: Andrew Huerta MRN: [id number] DOB: 09-Sep-1965  Andrew Huerta is a 55 y.o. year old male who is a primary care patient of [name], DO. I reached out to [employer] by phone today in response to a referral sent by Andrew Huerta primary care provider.   Andrew Huerta was given information about care management services today including:  Care management services include personalized support from designated clinical staff supervised by his physician, including individualized plan of care and coordination with other care providers 24/7 contact phone numbers for assistance for urgent and routine care needs. The patient may stop care management services at any time by phone call to the office staff.  Patient agreed to services and verbal consent obtained.   Follow up plan: Telephone appointment with care management team member scheduled for:08/15/21  [place] Management  Direct Dial: [phone]

## 2021-08-15 ENCOUNTER — Ambulatory Visit: Payer: Self-pay | Admitting: Licensed Clinical Social Worker

## 2021-08-15 DIAGNOSIS — F332 Major depressive disorder, recurrent severe without psychotic features: Secondary | ICD-10-CM

## 2021-08-15 DIAGNOSIS — F4310 Post-traumatic stress disorder, unspecified: Secondary | ICD-10-CM

## 2021-08-15 DIAGNOSIS — R45851 Suicidal ideations: Secondary | ICD-10-CM

## 2021-08-15 NOTE — Chronic Care Management (AMB) (Signed)
Care Management  Clinical Social Work Note  08/15/2021 Name: Andrew Huerta MRN: [id number] DOB: September 21, 1965  Andrew Huerta is a 55 y.o. year old male who is a primary care patient of [name], DO. The CCM team was consulted for assistance with care coordination needs: Mental Health Counseling and Resources and Grief Counseling   Andrew Huerta was given information about Care Management services today including:  Care Management services include personalized support from designated clinical staff supervised by his physician, including individualized plan of care and coordination with other care providers 24/7 contact phone numbers for assistance for urgent and routine care needs. The patient may stop care management services at any time (effective at the end of the month) by phone call to the office staff.  Patient agreed to services and consent obtained.   Engaged with patient by telephone for initial visit in response to provider referral for social care coordination services. Assessed patient's previous and current treatment, coping skills, support system and barriers to care. Reminded patient of all up coming appointments and placed referrals for mental health treatment .   Assessment includes: Review of patient past medical history, allergies, medications, and health status, including review of pertinent consultant reports was performed as part of comprehensive evaluation and provision of care management/care coordination services.See Care Plan below for interventions and patient self-care actives.  SDOH (Social Determinants of Health) screening and interventions performed today:  SDOH Interventions    Flowsheet Row Most Recent Value  SDOH Interventions   Food Insecurity Interventions Other (Comment)  [receives food stamps]  Stress Interventions Provide Counseling       Advanced Directives Status:Not addressed in this encounter.     Care Plan    Conditions to be  addressed/monitored per PCP order: , Mental Health Concerns   Care Plan : General Social Work (Adult)  Updates made by Maurine Cane, LCSW since 08/15/2021 12:00 AM     Problem: Coping Skills      Goal: Coping Skills Enhanced by connecting with Mental Health provider   Start Date: 08/15/2021  This Visit's Progress: On track  Priority: High  Note:   Current Barriers:  Disease Management support and education needs related to Depression: depressed mood suicidal thoughts without plan and Grief No health insurance   CSW Clinical Goal(s):  Patient  will work with mental health support discussed today to address needs related to mental health  through collaboration with Clinical Social Worker, provider, and care team.   Interventions: Inter-disciplinary care team collaboration (see longitudinal plan of care) Evaluation of current treatment plan related to  self management and patient's adherence to plan as established by provider Review resources, discussed options and provided patient information about  Department of Social Services ( resources ) Options for mental health treatment based on need and no insurance  [place] of the [place] for [place]:  (Status: New goal.) Evaluation of current treatment plan related to Suicidal Ideation, Grief, and Stress at home Motivational Interviewing employed Depression screen reviewed  Solution-Focused Strategies employed: to connect for treatment  Active listening / Reflection utilized  Emotional Support Provided Problem [place] strategies reviewed Provided psychoeducation for mental health needs  Reviewed mental health medications with patient and discussed importance of compliance:  Participation in counseling encouraged  Crisis Resource Education / information provided  Provided EMMI education information on Relieving Stress and Managing Anxiety around  daily tasks Made referral to [facility]  and [facility]  per patient's request  Patient Self-Care Activities: Call [facility] [phone] to follow up on the referral I placed Continue with compliance of taking medication  I have placed a referral to [facility]  they will contact you.   Review your EMMI educational information on Relieving Stress and Managing Anxiety around daily tasks    Follow up Plan:  Patient would like continued follow-up from CCM LCSW.  per patient's request will follow up in 3 weeks.  Will call office if needed prior to next encounter.  Andrew Huerta, [place] / [place]   [phone]

## 2021-08-15 NOTE — Progress Notes (Signed)
SUBJECTIVE:   CHIEF COMPLAINT / HPI:   Diabetic Follow Up: Patient is a 55 y.o. male who present today for diabetic follow up.   Patient endorses no problems  Home medications include: Glipizide 5 mg daily, metformin 500 mg daily Patient endorses not using the metformin due to GI adverse effects and not taking the glipizide due to previous A1C improving so much while off medications.   Most recent A1Cs:  Lab Results  Component Value Date   HGBA1C 7.8 (A) 08/19/2021   HGBA1C 7.5 (A) 03/25/2021   HGBA1C 10.0 (A) 12/27/2020   Last Microalbumin, LDL, Creatinine: Lab Results  Component Value Date   MICROALBUR 0.2 05/30/2016   LDLCALC 130 (H) 03/05/2021   CREATININE 0.99 02/25/2021   Patient does not check blood glucose on a regular basis.  Patient is not up to date on diabetic eye. Patient is up to date on diabetic foot exam.  Hyperlipidemia: Previous LDL of 130 in July.  Continues on atorvastatin 80 mg daily.  History of suicidal ideation, passive: Noted at previous office visit on 08/12/2021.  Referral was placed for chronic care management to connect with therapy services.  Noted that patient wanted to discuss family dynamics and other social challenges.  Patient was able to see our chronic care management specialist on 12/15 and referral was placed for behavioral health to contact the patient. Today the patient states this time of year is hard as he doesn't have much family.  Denies any active SI/HI.  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  He notes some improvement with the digits after rest.  Denies any recent injuries or trauma.  He try to wear brace during sleeping but neuropathy symptoms because of the remove the brace.  Has a history of carpal tunnel.  At last visit was given prescription for Voltaren topical gel with follow-up in 1 week and recommended to wear the  wrist splint at night.  Today states it's a bit better with the brace but he washes dishes a lot and can't wear the brace during that.   PERTINENT  PMH / PSH: T2DM  OBJECTIVE:   BP 107/77    Pulse 84    Ht 5\' 7"  (1.702 m)    Wt 152 lb (68.9 kg)    SpO2 99%    BMI 23.81 kg/m    [employer] Office Visit from 08/19/2021 in [place]  PHQ-9 Total Score 15        General: NAD, pleasant, able to participate in exam Respiratory: No respiratory distress MSK: No obvious visible abnormalities, no set places with pain on palpation of the right hand.  He does have positive Tinel's sign of the right hand recreating his symptoms Neuro: alert, no obvious focal deficits Psych: Normal affect and mood  ASSESSMENT/PLAN:   Hyperlipidemia associated with type 2 diabetes mellitus (HCC) Previous LDL of 130, goal less than 70.  On atorvastatin 80 mg daily.  We will check LDL today.  Consider Zetia 10 mg daily pending the results of this. On further discussion he asks if we can hold off until January as his insurance will be changing.  He will reschedule an appointment or reach out when he is ready for the lab.  Controlled type 2 diabetes mellitus without complication, without long-term current use of insulin (HCC) Previous A1c of 7.5, A1c today of  7.8.  Dc'd metformin due to gi side effects. Recommended restarting glipizide.Recommended diabetic eye exam.   Carpal tunnel, right Previously wore a brace while sleeping but neuropathy symptoms caused him to remove the brace due to discomfort.  History of carpal tunnel.  Physical exam with carpal tunnel.  Has been using Voltaren gel.  Discussed various options including physical therapy, referral to sports medicine.  Patient ultimately opts for continue with conservative therapy until his insurance changes.  He states he has had this for some time and can continue to work with it.  History of passive SI: No passive or active SI today.   Patient still working on getting set up with behavioral health.  He states he is maintaining at this point.  Discussed with him that this part of the year can be challenging especially if you do not have a lot of family in the area.  Patient is going to reach out as needed.  [name], [place]

## 2021-08-15 NOTE — Patient Instructions (Signed)
Visit Information  Thank you for taking time to visit with me today. Please don't hesitate to contact me if I can be of assistance to you before our next scheduled telephone appointment.  Our next appointment is by telephone on Jan. 4th at 8:30  Please call the care guide team at [phone] if you need to cancel or reschedule your appointment.   If you are experiencing a Mental Health or [place] or need someone to talk to, please call the Suicide and Crisis Lifeline: 988 call the [place] National Suicide Prevention Lifeline: 571 026 2371 or TTY: 321 199 5477 TTY (980)045-2975) to talk to a trained counselor call 1-800-273-TALK (toll free, 24 hour hotline) go to [facility] [address], [place] [phone]) call 911   Following is a copy of your full plan of care:  Care Plan : General Social Work (Adult)  Updates made by Maurine Cane, LCSW since 08/15/2021 12:00 AM     Problem: Coping Skills      Goal: Coping Skills Enhanced by connecting with Mental Health provider   Start Date: 08/15/2021  This Visit's Progress: On track  Priority: High  Note:   Current Barriers:  Disease Management support and education needs related to Depression: depressed mood suicidal thoughts without plan and Grief No health insurance   CSW Clinical Goal(s):  Patient  will work with mental health support discussed today to address needs related to mental health  through collaboration with Clinical Social Worker, provider, and care team.   Interventions: Inter-disciplinary care team collaboration (see longitudinal plan of care) Evaluation of current treatment plan related to  self management and patient's adherence to plan as established by provider Review resources, discussed options and provided patient information about  Department of Social Services ( resources ) Options for mental health treatment based on need and no insurance   [place] of the [employer] for [place]:  (Status: New goal.) Evaluation of current treatment plan related to Suicidal Ideation, Grief, and Stress at home Motivational Interviewing employed Depression screen reviewed  Solution-Focused Strategies employed: to connect for treatment  Active listening / Reflection utilized  Emotional Support Provided Problem [place] strategies reviewed Provided psychoeducation for mental health needs  Reviewed mental health medications with patient and discussed importance of compliance:  Participation in counseling [place] representative / information provided  Provided EMMI education information on Relieving Stress and Managing Anxiety around daily tasks Made referral to [facility] and [facility]  per patient's request  Patient Self-Care Activities: Call [facility] [phone] to follow up on the referral I placed Continue with compliance of taking medication  I have placed a referral to [facility]  they will contact you.   Review your EMMI educational information on Relieving Stress and Managing Anxiety around daily tasks     Mr. Mcfarland was given information about Care Management services by the embedded care coordination team including:  Care Management services include personalized support from designated clinical staff supervised by his physician, including individualized plan of care and coordination with other care providers 24/7 contact phone numbers for assistance for urgent and routine care needs. The patient may stop CCM services at any time (effective at the end of the month) by phone call to the office staff.  Patient agreed to services and verbal consent obtained.   Patient verbalizes understanding of instructions provided today and agrees to view  in [place].   [name], [place] / [place]   [phone]

## 2021-08-19 ENCOUNTER — Ambulatory Visit (INDEPENDENT_AMBULATORY_CARE_PROVIDER_SITE_OTHER): Payer: Self-pay | Admitting: Family Medicine

## 2021-08-19 ENCOUNTER — Other Ambulatory Visit: Payer: Self-pay

## 2021-08-19 VITALS — BP 107/77 | HR 84 | Ht 67.0 in | Wt 152.0 lb

## 2021-08-19 DIAGNOSIS — E119 Type 2 diabetes mellitus without complications: Secondary | ICD-10-CM

## 2021-08-19 DIAGNOSIS — E1169 Type 2 diabetes mellitus with other specified complication: Secondary | ICD-10-CM

## 2021-08-19 DIAGNOSIS — E785 Hyperlipidemia, unspecified: Secondary | ICD-10-CM

## 2021-08-19 LAB — POCT GLYCOSYLATED HEMOGLOBIN (HGB A1C): HbA1c, POC (controlled diabetic range): 7.8 % — AB (ref 0.0–7.0)

## 2021-08-19 NOTE — Patient Instructions (Signed)
Today we checked her A1c, it was 7.8.  Goal is less than 7.  Let us discontinue the metformin and restart the glipizide.  I would like to see back in 3 months.  I recommend that you have a diabetic eye exam each year.  We can check your LDL once you have new insurance.  Call and let me know when you have it and I will place the order.  I can always order it before this if you prefer.  For your wrist you have signs of carpal tunnel.  Continue using the wrist splints and the stretches that we showed today.  I can always get you referred to sports medicine or physical therapy if you would like.  If you develop any thoughts of harming yourself or harming anyone else do not hesitate to reach out.    If you are feeling suicidal or depression symptoms worsen please immediately go to:   If you are thinking about harming yourself or having thoughts of suicide, or if you know someone who is, seek help right away. If you are in crisis, make sure you are not left alone.  If someone else is in crisis, make sure he/she/they is not left alone  Call 988 OR 1-800-273-TALK  24 Hour Availability for [place]  [address] [place], [place] Crisis [phone]    Other crisis resources:  [facility] of the [employer] (Domestic Violence, Rape & Victim Assistance [phone]  [facility] [place]    (ONLY from 8am-4pm)    [phone]  [facility] Mobile Crisis Unit (24/7)   [phone]  [place] National Suicide Hotline   423-239-8035 Diamantina Monks)

## 2021-08-19 NOTE — Assessment & Plan Note (Signed)
Previous A1c of 7.5, A1c today of 7.8.  Dc'd metformin due to gi side effects. Recommended restarting glipizide.Recommended diabetic eye exam.

## 2021-08-19 NOTE — Assessment & Plan Note (Addendum)
Previous LDL of 130, goal less than 70.  On atorvastatin 80 mg daily.  We will check LDL today.  Consider Zetia 10 mg daily pending the results of this. On further discussion he asks if we can hold off until January as his insurance will be changing.  He will reschedule an appointment or reach out when he is ready for the lab.

## 2021-09-04 ENCOUNTER — Telehealth: Payer: Self-pay

## 2021-09-05 ENCOUNTER — Other Ambulatory Visit: Payer: Self-pay | Admitting: Family Medicine

## 2021-09-05 ENCOUNTER — Ambulatory Visit: Payer: Self-pay | Admitting: Licensed Clinical Social Worker

## 2021-09-05 DIAGNOSIS — F4310 Post-traumatic stress disorder, unspecified: Secondary | ICD-10-CM

## 2021-09-05 DIAGNOSIS — Z7189 Other specified counseling: Secondary | ICD-10-CM

## 2021-09-05 DIAGNOSIS — F332 Major depressive disorder, recurrent severe without psychotic features: Secondary | ICD-10-CM

## 2021-09-05 DIAGNOSIS — G894 Chronic pain syndrome: Secondary | ICD-10-CM

## 2021-09-05 MED ORDER — HYDROCODONE-ACETAMINOPHEN 5-325 MG PO TABS: 1.0000 | ORAL_TABLET | Freq: Three times a day (TID) | ORAL | 0 refills | Status: DC | PRN

## 2021-09-05 NOTE — Chronic Care Management (AMB) (Signed)
Care Management  Clinical Social Work Note  09/05/2021 Name: Andrew Huerta MRN: [id number] DOB: 1966/06/06  Andrew Huerta is a 56 y.o. year old male who is a primary care patient of Andrew Del, DO. The CCM team was consulted for assistance with care coordination needs. Mental Health Counseling and Resources   Consent to Services:  The patient was given information about Care Management services, agreed to services, and gave verbal consent prior to initiation of services.  Please see initial visit note for detailed documentation.   Patient agreed to services today and consent obtained.  Engaged with patient by telephone for follow up visit in response to provider referral for social work care coordination services. Assessed patient's current treatment, progress, coping skills, support system and barriers to care. .   Assessment includes: Review of patient past medical history, allergies, medications, and health status, including review of pertinent consultant reports was performed as part of comprehensive evaluation and provision of care management/care coordination services. See Care Plan below for interventions and patient self-care actives.   SDOH (Social Determinants of Health) screening and interventions performed today:  SDOH Interventions    Flowsheet Row Most Recent Value  SDOH Interventions   Depression Interventions/Treatment  Referral to Psychiatry, Currently on Treatment       Advanced Directives Status:Not addressed in this encounter.     Care Plan    Conditions to be addressed/monitored per PCP order: Anxiety and Depression, Mental Health Concerns   Care Plan : General Social Work (Adult)  Updates made by Andrew Cane, LCSW since 09/05/2021 12:00 AM     Problem: Coping Skills      Goal: Coping Skills Enhanced by connecting with Mental Health provider Completed 09/05/2021  Start Date: 08/15/2021  This Visit's Progress: On track  Recent Progress: On track   Priority: High  Note:   Current Barriers:  Disease Management support and education needs related to Depression: depressed mood suicidal thoughts without plan and Grief No health insurance   CSW Clinical Goal(s):  Patient  will work with mental health support discussed today to address needs related to mental health  through collaboration with Clinical Social Worker, provider, and care team.   Interventions: Inter-disciplinary care team collaboration (see longitudinal plan of care) Evaluation of current treatment plan related to  self management and patient's adherence to plan as established by provider  Mental Health:  (Status: Goal on Track (progressing): YES.) Has appointment with [facility]; using EMMI educational videos to assist with copying skills;  Evaluation of current treatment plan related to Suicidal Ideation, Grief, and Stress at home Motivational Interviewing employed PHQ2/ PHQ9 completed Solution-Focused Strategies employed: to connect for treatment  Problem [place] strategies reviewed Provided psychoeducation for mental health needs  Quality of sleep assessed & Sleep Hygiene techniques promoted  Crisis Resource Education / information provided  Provided EMMI education information on Movement: Emotional Health & Insomnia and getting a good night's sleep Suicidal Ideation/Homicidal Ideation assessed: Discussed referral for psychiatry  per patient's request  Patient Self-Care Activities: Call [facility] [phone] to follow up on the referral I placed Continue with compliance of taking medication  Review your EMMI educational information (Movement : Emotional Health  and Insomnia & Getting a Good Night's Sleep) Look for an e-mail from [place]. Keep appointment with [facility] on 10/28/21 Call to schedule f/u appointment with Dr. Vanessa Huerta    Follow up Plan:  All care plan goals have  been  met. Will disconnect from care team after this encounter. Patient has been informed to contact the office if new needs arise.  Andrew Huerta, [place] / [place]   [phone]

## 2021-09-05 NOTE — Patient Instructions (Signed)
Visit Information  Thank you for taking time to visit with me today. Please don't hesitate to contact me if I can be of assistance to you before our next scheduled telephone appointment.  Following are the goals we discussed today: Connecting for Mental Health treatment Patient Self-Care Activities: Call [facility] [phone] to follow up on the referral I placed Continue with compliance of taking medication  Review your EMMI educational information (Movement : Emotional Health  and Insomnia & Getting a Good Night's Sleep) Look for an e-mail from [facility]. Keep appointment with [facility] on 10/28/21 Call to schedule f/u appointment with Dr. Vanessa   Please call the care guide team at [phone] if you need to cancel or reschedule your appointment.   If you are experiencing a Mental Health or [place] or need someone to talk to, please call the Suicide and Crisis Lifeline: 988 call the [place] National Suicide Prevention Lifeline: 7401783918 or TTY: (603)586-7971 TTY (819) 607-1645) to talk to a trained counselor call 1-800-273-TALK (toll free, 24 hour hotline) go to [facility] [place] ([phone]) call 911    No follow up scheduled, per our conversation you do not require continued follow up I will disconnect from your care team at this time, please call the office if additional needs are identified  Patient verbalizes understanding of instructions provided today and agrees to view in [place].  [name], LCSW Care Management & Coordination  [phone]

## 2021-10-07 ENCOUNTER — Other Ambulatory Visit: Payer: Self-pay | Admitting: Family Medicine

## 2021-10-07 DIAGNOSIS — G894 Chronic pain syndrome: Secondary | ICD-10-CM

## 2021-10-08 MED ORDER — HYDROCODONE-ACETAMINOPHEN 5-325 MG PO TABS: 1.0000 | ORAL_TABLET | Freq: Three times a day (TID) | ORAL | 0 refills | Status: DC | PRN

## 2021-10-28 ENCOUNTER — Other Ambulatory Visit: Payer: Self-pay

## 2021-10-28 ENCOUNTER — Ambulatory Visit (INDEPENDENT_AMBULATORY_CARE_PROVIDER_SITE_OTHER): Payer: No Payment, Other | Admitting: Clinical

## 2021-10-28 DIAGNOSIS — F4310 Post-traumatic stress disorder, unspecified: Secondary | ICD-10-CM

## 2021-10-28 DIAGNOSIS — F331 Major depressive disorder, recurrent, moderate: Secondary | ICD-10-CM

## 2021-10-28 NOTE — Plan of Care (Signed)
Client was in agreement with the plan.
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
## 2021-11-03 ENCOUNTER — Other Ambulatory Visit: Payer: Self-pay | Admitting: Family Medicine

## 2021-11-03 DIAGNOSIS — G894 Chronic pain syndrome: Secondary | ICD-10-CM

## 2021-11-03 DIAGNOSIS — N521 Erectile dysfunction due to diseases classified elsewhere: Secondary | ICD-10-CM

## 2021-11-04 MED ORDER — HYDROCODONE-ACETAMINOPHEN 5-325 MG PO TABS: 1.0000 | ORAL_TABLET | Freq: Three times a day (TID) | ORAL | 0 refills | Status: DC | PRN

## 2021-12-05 ENCOUNTER — Other Ambulatory Visit: Payer: Self-pay | Admitting: Family Medicine

## 2021-12-05 DIAGNOSIS — G894 Chronic pain syndrome: Secondary | ICD-10-CM

## 2021-12-05 MED ORDER — HYDROCODONE-ACETAMINOPHEN 5-325 MG PO TABS: 1.0000 | ORAL_TABLET | Freq: Three times a day (TID) | ORAL | 0 refills | Status: DC | PRN

## 2021-12-11 ENCOUNTER — Encounter (HOSPITAL_COMMUNITY): Payer: Self-pay | Admitting: Cardiovascular Disease

## 2021-12-11 ENCOUNTER — Inpatient Hospital Stay (HOSPITAL_COMMUNITY)
Admission: EM | Admit: 2021-12-11 | Discharge: 2021-12-14 | DRG: 224 | Disposition: A | Discharge status: Home / Self Care | Payer: BC Managed Care – PPO | Attending: Cardiovascular Disease | Admitting: Cardiovascular Disease

## 2021-12-11 ENCOUNTER — Other Ambulatory Visit: Payer: Self-pay

## 2021-12-11 ENCOUNTER — Telehealth: Payer: Self-pay

## 2021-12-11 ENCOUNTER — Emergency Department (HOSPITAL_COMMUNITY): Payer: BC Managed Care – PPO

## 2021-12-11 ENCOUNTER — Inpatient Hospital Stay (HOSPITAL_COMMUNITY): Payer: BC Managed Care – PPO

## 2021-12-11 DIAGNOSIS — R000 Tachycardia, unspecified: Secondary | ICD-10-CM | POA: Diagnosis not present

## 2021-12-11 DIAGNOSIS — R1111 Vomiting without nausea: Secondary | ICD-10-CM | POA: Diagnosis not present

## 2021-12-11 DIAGNOSIS — N179 Acute kidney failure, unspecified: Secondary | ICD-10-CM | POA: Diagnosis not present

## 2021-12-11 DIAGNOSIS — Z79899 Other long term (current) drug therapy: Secondary | ICD-10-CM

## 2021-12-11 DIAGNOSIS — M1712 Unilateral primary osteoarthritis, left knee: Secondary | ICD-10-CM | POA: Diagnosis present

## 2021-12-11 DIAGNOSIS — I4729 Other ventricular tachycardia: Secondary | ICD-10-CM | POA: Diagnosis not present

## 2021-12-11 DIAGNOSIS — I5023 Acute on chronic systolic (congestive) heart failure: Secondary | ICD-10-CM | POA: Diagnosis not present

## 2021-12-11 DIAGNOSIS — I110 Hypertensive heart disease with heart failure: Secondary | ICD-10-CM | POA: Diagnosis not present

## 2021-12-11 DIAGNOSIS — R079 Chest pain, unspecified: Secondary | ICD-10-CM

## 2021-12-11 DIAGNOSIS — I2582 Chronic total occlusion of coronary artery: Secondary | ICD-10-CM | POA: Diagnosis not present

## 2021-12-11 DIAGNOSIS — Z800 Family history of malignant neoplasm of digestive organs: Secondary | ICD-10-CM

## 2021-12-11 DIAGNOSIS — Z8249 Family history of ischemic heart disease and other diseases of the circulatory system: Secondary | ICD-10-CM

## 2021-12-11 DIAGNOSIS — G8929 Other chronic pain: Secondary | ICD-10-CM | POA: Diagnosis present

## 2021-12-11 DIAGNOSIS — E872 Acidosis, unspecified: Secondary | ICD-10-CM | POA: Diagnosis present

## 2021-12-11 DIAGNOSIS — R570 Cardiogenic shock: Secondary | ICD-10-CM | POA: Diagnosis present

## 2021-12-11 DIAGNOSIS — I9589 Other hypotension: Secondary | ICD-10-CM | POA: Diagnosis present

## 2021-12-11 DIAGNOSIS — E1169 Type 2 diabetes mellitus with other specified complication: Secondary | ICD-10-CM | POA: Diagnosis present

## 2021-12-11 DIAGNOSIS — E896 Postprocedural adrenocortical (-medullary) hypofunction: Secondary | ICD-10-CM | POA: Diagnosis present

## 2021-12-11 DIAGNOSIS — K219 Gastro-esophageal reflux disease without esophagitis: Secondary | ICD-10-CM | POA: Diagnosis present

## 2021-12-11 DIAGNOSIS — E1165 Type 2 diabetes mellitus with hyperglycemia: Secondary | ICD-10-CM | POA: Diagnosis present

## 2021-12-11 DIAGNOSIS — T68XXXA Hypothermia, initial encounter: Secondary | ICD-10-CM

## 2021-12-11 DIAGNOSIS — E785 Hyperlipidemia, unspecified: Secondary | ICD-10-CM | POA: Diagnosis not present

## 2021-12-11 DIAGNOSIS — Z886 Allergy status to analgesic agent status: Secondary | ICD-10-CM

## 2021-12-11 DIAGNOSIS — Z7982 Long term (current) use of aspirin: Secondary | ICD-10-CM

## 2021-12-11 DIAGNOSIS — Z888 Allergy status to other drugs, medicaments and biological substances status: Secondary | ICD-10-CM

## 2021-12-11 DIAGNOSIS — R0602 Shortness of breath: Secondary | ICD-10-CM | POA: Diagnosis not present

## 2021-12-11 DIAGNOSIS — I517 Cardiomegaly: Secondary | ICD-10-CM | POA: Diagnosis not present

## 2021-12-11 DIAGNOSIS — I10 Essential (primary) hypertension: Secondary | ICD-10-CM | POA: Diagnosis present

## 2021-12-11 DIAGNOSIS — R778 Other specified abnormalities of plasma proteins: Secondary | ICD-10-CM | POA: Diagnosis present

## 2021-12-11 DIAGNOSIS — F32A Depression, unspecified: Secondary | ICD-10-CM | POA: Diagnosis present

## 2021-12-11 DIAGNOSIS — I2510 Atherosclerotic heart disease of native coronary artery without angina pectoris: Secondary | ICD-10-CM | POA: Diagnosis present

## 2021-12-11 DIAGNOSIS — I5021 Acute systolic (congestive) heart failure: Secondary | ICD-10-CM | POA: Diagnosis not present

## 2021-12-11 DIAGNOSIS — F419 Anxiety disorder, unspecified: Secondary | ICD-10-CM | POA: Diagnosis present

## 2021-12-11 DIAGNOSIS — Z955 Presence of coronary angioplasty implant and graft: Secondary | ICD-10-CM | POA: Diagnosis not present

## 2021-12-11 DIAGNOSIS — Z23 Encounter for immunization: Secondary | ICD-10-CM | POA: Diagnosis not present

## 2021-12-11 DIAGNOSIS — Z885 Allergy status to narcotic agent status: Secondary | ICD-10-CM

## 2021-12-11 DIAGNOSIS — R0689 Other abnormalities of breathing: Secondary | ICD-10-CM | POA: Diagnosis not present

## 2021-12-11 DIAGNOSIS — D72829 Elevated white blood cell count, unspecified: Secondary | ICD-10-CM | POA: Diagnosis not present

## 2021-12-11 DIAGNOSIS — Z833 Family history of diabetes mellitus: Secondary | ICD-10-CM

## 2021-12-11 DIAGNOSIS — I255 Ischemic cardiomyopathy: Secondary | ICD-10-CM | POA: Diagnosis not present

## 2021-12-11 DIAGNOSIS — R680 Hypothermia, not associated with low environmental temperature: Secondary | ICD-10-CM | POA: Diagnosis present

## 2021-12-11 DIAGNOSIS — I472 Ventricular tachycardia, unspecified: Secondary | ICD-10-CM | POA: Diagnosis not present

## 2021-12-11 DIAGNOSIS — R112 Nausea with vomiting, unspecified: Secondary | ICD-10-CM | POA: Diagnosis not present

## 2021-12-11 DIAGNOSIS — I25118 Atherosclerotic heart disease of native coronary artery with other forms of angina pectoris: Secondary | ICD-10-CM | POA: Diagnosis not present

## 2021-12-11 DIAGNOSIS — M545 Low back pain, unspecified: Secondary | ICD-10-CM | POA: Diagnosis present

## 2021-12-11 DIAGNOSIS — Z96651 Presence of right artificial knee joint: Secondary | ICD-10-CM | POA: Diagnosis present

## 2021-12-11 DIAGNOSIS — Z87442 Personal history of urinary calculi: Secondary | ICD-10-CM

## 2021-12-11 DIAGNOSIS — I5022 Chronic systolic (congestive) heart failure: Secondary | ICD-10-CM | POA: Diagnosis present

## 2021-12-11 DIAGNOSIS — Z7902 Long term (current) use of antithrombotics/antiplatelets: Secondary | ICD-10-CM

## 2021-12-11 DIAGNOSIS — Z7984 Long term (current) use of oral hypoglycemic drugs: Secondary | ICD-10-CM

## 2021-12-11 HISTORY — DX: Acute kidney failure, unspecified: N17.9

## 2021-12-11 LAB — ECHOCARDIOGRAM COMPLETE
AR max vel: 1.6 cm2
AV Peak grad: 3.8 mmHg
Ao pk vel: 0.97 m/s
Area-P 1/2: 5.31 cm2
Calc EF: 24.2 %
S' Lateral: 4.7 cm
Single Plane A2C EF: 24.4 %
Single Plane A4C EF: 22.3 %
Weight: 2430.35 oz

## 2021-12-11 LAB — TROPONIN I (HIGH SENSITIVITY)
Troponin I (High Sensitivity): 196 ng/L (ref <18)
Troponin I (High Sensitivity): 265 ng/L (ref <18)

## 2021-12-11 LAB — COMPREHENSIVE METABOLIC PANEL
ALT: 31 U/L (ref 0–44)
AST: 43 U/L — ABNORMAL HIGH (ref 15–41)
Albumin: 3.4 g/dL — ABNORMAL LOW (ref 3.5–5.0)
Alkaline Phosphatase: 42 U/L (ref 38–126)
Anion gap: 15 (ref 5–15)
BUN: 21 mg/dL — ABNORMAL HIGH (ref 6–20)
CO2: 14 mmol/L — ABNORMAL LOW (ref 22–32)
Calcium: 8.3 mg/dL — ABNORMAL LOW (ref 8.9–10.3)
Chloride: 110 mmol/L (ref 98–111)
Creatinine, Ser: 1.75 mg/dL — ABNORMAL HIGH (ref 0.61–1.24)
GFR, Estimated: 45 mL/min — ABNORMAL LOW (ref >60)
Glucose, Bld: 346 mg/dL — ABNORMAL HIGH (ref 70–99)
Potassium: 4.4 mmol/L (ref 3.5–5.1)
Sodium: 139 mmol/L (ref 135–145)
Total Bilirubin: 1.4 mg/dL — ABNORMAL HIGH (ref 0.3–1.2)
Total Protein: 5.8 g/dL — ABNORMAL LOW (ref 6.5–8.1)

## 2021-12-11 LAB — CBC WITH DIFFERENTIAL/PLATELET
Abs Immature Granulocytes: 1.31 10*3/uL — ABNORMAL HIGH (ref 0.00–0.07)
Basophils Absolute: 0.1 10*3/uL (ref 0.0–0.1)
Basophils Relative: 1 %
Eosinophils Absolute: 0 10*3/uL (ref 0.0–0.5)
Eosinophils Relative: 0 %
HCT: 39.7 % (ref 39.0–52.0)
Hemoglobin: 12.9 g/dL — ABNORMAL LOW (ref 13.0–17.0)
Immature Granulocytes: 6 %
Lymphocytes Relative: 9 %
Lymphs Abs: 2 10*3/uL (ref 0.7–4.0)
MCH: 31.5 pg (ref 26.0–34.0)
MCHC: 32.5 g/dL (ref 30.0–36.0)
MCV: 96.8 fL (ref 80.0–100.0)
Monocytes Absolute: 1.2 10*3/uL — ABNORMAL HIGH (ref 0.1–1.0)
Monocytes Relative: 6 %
Neutro Abs: 17.2 10*3/uL — ABNORMAL HIGH (ref 1.7–7.7)
Neutrophils Relative %: 78 %
Platelets: 149 10*3/uL — ABNORMAL LOW (ref 150–400)
RBC: 4.1 MIL/uL — ABNORMAL LOW (ref 4.22–5.81)
RDW: 13.2 % (ref 11.5–15.5)
WBC: 21.9 10*3/uL — ABNORMAL HIGH (ref 4.0–10.5)
nRBC: 0 % (ref 0.0–0.2)

## 2021-12-11 LAB — MRSA NEXT GEN BY PCR, NASAL: MRSA by PCR Next Gen: NOT DETECTED

## 2021-12-11 LAB — GLUCOSE, CAPILLARY
Glucose-Capillary: 334 mg/dL — ABNORMAL HIGH (ref 70–99)
Glucose-Capillary: 316 mg/dL — ABNORMAL HIGH (ref 70–99)

## 2021-12-11 LAB — TSH
TSH: 0.841 u[IU]/mL (ref 0.350–4.500)
TSH: 0.624 u[IU]/mL (ref 0.350–4.500)

## 2021-12-11 LAB — HEMOGLOBIN A1C
Hgb A1c MFr Bld: 7.1 % — ABNORMAL HIGH (ref 4.8–5.6)
Mean Plasma Glucose: 157.07 mg/dL

## 2021-12-11 LAB — MAGNESIUM: Magnesium: 1.8 mg/dL (ref 1.7–2.4)

## 2021-12-11 LAB — LACTIC ACID, PLASMA
Lactic Acid, Venous: 4.9 mmol/L (ref 0.5–1.9)
Lactic Acid, Venous: 3.8 mmol/L (ref 0.5–1.9)

## 2021-12-11 LAB — CBG MONITORING, ED: Glucose-Capillary: 357 mg/dL — ABNORMAL HIGH (ref 70–99)

## 2021-12-11 LAB — HIV ANTIBODY (ROUTINE TESTING W REFLEX): HIV Screen 4th Generation wRfx: NONREACTIVE

## 2021-12-11 MED ORDER — PROCHLORPERAZINE EDISYLATE 10 MG/2ML IJ SOLN
5.0000 mg | Freq: Four times a day (QID) | INTRAMUSCULAR | Status: DC | PRN
  Administered 2021-12-11: 5 mg via INTRAVENOUS
  Filled 2021-12-11 (×2): qty 1

## 2021-12-11 MED ORDER — SODIUM CHLORIDE 0.9% FLUSH
3.0000 mL | Freq: Two times a day (BID) | INTRAVENOUS | Status: DC
  Administered 2021-12-11 (×2): 3 mL via INTRAVENOUS

## 2021-12-11 MED ORDER — SODIUM CHLORIDE 0.9 % IV SOLN: INTRAVENOUS | Status: DC | PRN

## 2021-12-11 MED ORDER — ASPIRIN EC 81 MG PO TBEC: 81.0000 mg | DELAYED_RELEASE_TABLET | Freq: Every day | ORAL | Status: DC

## 2021-12-11 MED ORDER — SODIUM CHLORIDE 0.9 % WEIGHT BASED INFUSION
1.0000 mL/kg/h | INTRAVENOUS | Status: DC
  Administered 2021-12-12: 1 mL/kg/h via INTRAVENOUS

## 2021-12-11 MED ORDER — INSULIN ASPART 100 UNIT/ML IJ SOLN
0.0000 [IU] | Freq: Every day | INTRAMUSCULAR | Status: DC
  Administered 2021-12-11: 4 [IU] via SUBCUTANEOUS

## 2021-12-11 MED ORDER — HYDRALAZINE HCL 25 MG PO TABS
25.0000 mg | ORAL_TABLET | Freq: Three times a day (TID) | ORAL | Status: DC
  Administered 2021-12-11 – 2021-12-13 (×4): 25 mg via ORAL
  Filled 2021-12-11 (×4): qty 1

## 2021-12-11 MED ORDER — ASPIRIN 81 MG PO CHEW
324.0000 mg | CHEWABLE_TABLET | ORAL | Status: AC
  Administered 2021-12-11: 324 mg via ORAL
  Filled 2021-12-11 (×2): qty 4

## 2021-12-11 MED ORDER — SODIUM CHLORIDE 0.9% FLUSH: 3.0000 mL | INTRAVENOUS | Status: DC | PRN

## 2021-12-11 MED ORDER — SODIUM CHLORIDE 0.9 % IV BOLUS
1000.0000 mL | Freq: Once | INTRAVENOUS | Status: AC
  Administered 2021-12-11: 1000 mL via INTRAVENOUS

## 2021-12-11 MED ORDER — INSULIN ASPART 100 UNIT/ML IJ SOLN
0.0000 [IU] | Freq: Three times a day (TID) | INTRAMUSCULAR | Status: DC
  Administered 2021-12-12 – 2021-12-14 (×3): 2 [IU] via SUBCUTANEOUS

## 2021-12-11 MED ORDER — SODIUM CHLORIDE 0.9 % IV SOLN: 250.0000 mL | INTRAVENOUS | Status: DC | PRN

## 2021-12-11 MED ORDER — ETOMIDATE 2 MG/ML IV SOLN
INTRAVENOUS | Status: DC | PRN
  Administered 2021-12-11: 5 mg via INTRAVENOUS

## 2021-12-11 MED ORDER — ASPIRIN 300 MG RE SUPP: 300.0000 mg | RECTAL | Status: AC

## 2021-12-11 MED ORDER — SODIUM CHLORIDE 0.9% FLUSH
3.0000 mL | Freq: Two times a day (BID) | INTRAVENOUS | Status: DC
  Administered 2021-12-11: 3 mL via INTRAVENOUS

## 2021-12-11 MED ORDER — ACETAMINOPHEN 325 MG PO TABS: 650.0000 mg | ORAL_TABLET | ORAL | Status: DC | PRN

## 2021-12-11 MED ORDER — PERFLUTREN LIPID MICROSPHERE
1.0000 mL | INTRAVENOUS | Status: AC | PRN
  Administered 2021-12-11: 2 mL via INTRAVENOUS
  Filled 2021-12-11: qty 10

## 2021-12-11 MED ORDER — ASPIRIN 81 MG PO CHEW
81.0000 mg | CHEWABLE_TABLET | ORAL | Status: AC
  Administered 2021-12-12: 81 mg via ORAL
  Filled 2021-12-11: qty 1

## 2021-12-11 MED ORDER — SERTRALINE HCL 100 MG PO TABS
200.0000 mg | ORAL_TABLET | Freq: Every day | ORAL | Status: DC
  Administered 2021-12-11 – 2021-12-14 (×4): 200 mg via ORAL
  Filled 2021-12-11 (×5): qty 2

## 2021-12-11 MED ORDER — ISOSORBIDE MONONITRATE ER 30 MG PO TB24
30.0000 mg | ORAL_TABLET | Freq: Every day | ORAL | Status: DC
  Administered 2021-12-11 – 2021-12-12 (×2): 30 mg via ORAL
  Filled 2021-12-11 (×2): qty 1

## 2021-12-11 MED ORDER — ONDANSETRON HCL 4 MG/2ML IJ SOLN
4.0000 mg | Freq: Four times a day (QID) | INTRAMUSCULAR | Status: DC | PRN
  Administered 2021-12-11: 4 mg via INTRAVENOUS
  Filled 2021-12-11: qty 2

## 2021-12-11 MED ORDER — AMIODARONE HCL IN DEXTROSE 360-4.14 MG/200ML-% IV SOLN
60.0000 mg/h | INTRAVENOUS | Status: AC
Start: 2021-12-11 — End: 2021-12-11
  Administered 2021-12-11: 60 mg/h via INTRAVENOUS
  Filled 2021-12-11: qty 200

## 2021-12-11 MED ORDER — CHLORHEXIDINE GLUCONATE CLOTH 2 % EX PADS
6.0000 | MEDICATED_PAD | Freq: Every day | CUTANEOUS | Status: DC
  Administered 2021-12-11 – 2021-12-14 (×4): 6 via TOPICAL

## 2021-12-11 MED ORDER — PANTOPRAZOLE SODIUM 40 MG PO TBEC
40.0000 mg | DELAYED_RELEASE_TABLET | Freq: Every day | ORAL | Status: DC
  Administered 2021-12-11 – 2021-12-14 (×4): 40 mg via ORAL
  Filled 2021-12-11 (×4): qty 1

## 2021-12-11 MED ORDER — LACTATED RINGERS IV BOLUS
1000.0000 mL | Freq: Once | INTRAVENOUS | Status: DC
Start: 2021-12-11 — End: 2021-12-11

## 2021-12-11 MED ORDER — GLIPIZIDE 10 MG PO TABS
5.0000 mg | ORAL_TABLET | Freq: Every day | ORAL | Status: DC
Start: 2021-12-11 — End: 2021-12-14
  Administered 2021-12-11 – 2021-12-14 (×4): 5 mg via ORAL
  Filled 2021-12-11 (×4): qty 1

## 2021-12-11 MED ORDER — CLOPIDOGREL BISULFATE 75 MG PO TABS
75.0000 mg | ORAL_TABLET | Freq: Every day | ORAL | Status: DC
  Administered 2021-12-11 – 2021-12-12 (×2): 75 mg via ORAL
  Filled 2021-12-11 (×2): qty 1

## 2021-12-11 MED ORDER — SODIUM CHLORIDE 0.9 % WEIGHT BASED INFUSION
3.0000 mL/kg/h | INTRAVENOUS | Status: DC
  Administered 2021-12-12: 3 mL/kg/h via INTRAVENOUS

## 2021-12-11 MED ORDER — HYDRALAZINE HCL 20 MG/ML IJ SOLN: 10.0000 mg | Freq: Three times a day (TID) | INTRAMUSCULAR | Status: DC | PRN

## 2021-12-11 MED ORDER — PNEUMOCOCCAL 20-VAL CONJ VACC 0.5 ML IM SUSY
0.5000 mL | PREFILLED_SYRINGE | INTRAMUSCULAR | Status: AC
  Administered 2021-12-12: 0.5 mL via INTRAMUSCULAR
  Filled 2021-12-11 (×2): qty 0.5

## 2021-12-11 MED ORDER — NITROGLYCERIN 0.4 MG SL SUBL: 0.4000 mg | SUBLINGUAL_TABLET | SUBLINGUAL | Status: DC | PRN

## 2021-12-11 MED ORDER — ATORVASTATIN CALCIUM 80 MG PO TABS
80.0000 mg | ORAL_TABLET | Freq: Every day | ORAL | Status: DC
  Administered 2021-12-11 – 2021-12-14 (×4): 80 mg via ORAL
  Filled 2021-12-11: qty 1
  Filled 2021-12-11: qty 2
  Filled 2021-12-11 (×2): qty 1

## 2021-12-11 MED ORDER — AMIODARONE HCL IN DEXTROSE 360-4.14 MG/200ML-% IV SOLN
30.0000 mg/h | INTRAVENOUS | Status: DC
Start: 2021-12-11 — End: 2021-12-12
  Administered 2021-12-11 – 2021-12-12 (×2): 30 mg/h via INTRAVENOUS
  Filled 2021-12-11: qty 200

## 2021-12-11 NOTE — H&P (Addendum)
?Cardiology Admission History and Physical:  ? ?Patient ID: [name] ?MRN: [id number]; DOB: 18-Sep-1965  ? ?Admission date: 12/11/2021 ? ?PCP:  [name], DO ?  ?[facility] Providers ?Cardiologist:  Peter Martinique, MD   { ? ?Chief Complaint:  Ventricular tachycardia ? ?Patient Profile:  ? ?JOHNTHAN AXTMAN is a 56 y.o. male with CAD, HTN, ICM, syncope, HLD, chronic back pain, DM2, Barrett's esophagus, adrenal tumor s/p adrenal gland resection a [facility], primary hyperaldosteronism s/p adrenalectomy, ODS who is being seen 12/11/2021 for the evaluation of VT. ? ?History of Present Illness:  ? ?Mr. Trotta is followed by Dr. Martinique for the above cardiac issues. He had abnormal stress tet in 2015 with subsequent cardiac catheterization showing 2V disease with stenting of mLAD and mLCx. Repeat cath for recurrent chest pain showed patent steents. Admission May 2020 with chest discomfort. Echo 01/04/19 showed LVEF 35-40%, new inferior wall motiont abnormality. Cardiac cath showed 99% mRCA disease, 100% mid to ost to prox LAD disease, patent prox LAD stent, 80% prox to mid LAD lesion treated with DES and patent Lcx stent. He was discharged on Aspirin and Plavix . ? ?Previous intolerance to Imdur and could not afford Ranexa.  ? ?He had a syncopal episdoes in 02/21/20. 30 day heart monitor showed  NSR, rare PACs, occasinoal PVCs with 4% burden. Echo showed improvement in LVEF 40-45%. Coreg was subsequently discontinued.  ? ?He was seen again for syncope vs seizure with EEG and previous MRI head unremarkable. Suspected orthostatic vs vasovagal. Carotid duplex was ordered. No anginal symptoms.  ? ?The patient presented to the ER at [facility] 12/11/21 for tachycardia. Patient reports he started feeling badly last night. He had weakness, chills, nausea, vomiting, palpitations and SOB. Did have chest pain, but reports chronic chest pain that has not been worse. He denies LLE, orthopnea, pnd. Question as to whether he was taking all his  mediations. This morning EMS was called who found him to be in VT with heart rate up to 160bpm. He was given 2 infusions of IV amiodarone by EMS.  ? ?In the ER he was shocked x1 and converted to NSR, and started on IVF.  BG 357. Other labs pending. BP soft on arrival with sibseuent improvement. continue IV amiodarone and admit for further work-up.  ? ?Past Medical History:  ?Diagnosis Date  ? Adrenal tumor   ? a. s/p adrenal gland resection at [facility]. left side removal per patient  ? Anxiety   ? Barrett's esophagus   ? EGD - 11/27/09 - short segment of Barrett's  ? CAD (coronary artery disease)   ? a. 04/27/14 [place] s/p DES to mLAD and DES to mLCx  ? Chronic back pain   ? upper and lower per patient  ? Chronic chest pain   ? Degenerative joint disease   ? Bilateral knees. Significant knee pain since playing football in high school. also in back per patient  ? Depression   ? Diabetes mellitus type 2, controlled ([place]) 08/29/2015  ? Diagnosed by A1c 7.6% during 08/26/15 hospital admission for chest pain  ? Gastroesophageal reflux disease with hiatal hernia   ? GERD (gastroesophageal reflux disease)   ? Hiatal hernia   ? EGD - 11/27/2009  ? Hyperlipidemia   ? Hypertension   ? Low back pain   ? Primary hyperaldosteronism ([place]) 01/22/2012  ? S/P adrenalectomy     ? PTSD (post-traumatic stress disorder)   ? Seizures ([place])   ? Sleep apnea   ?  Stone, kidney   ? Syncope 04/10/2019  ? ? ?Past Surgical History:  ?Procedure Laterality Date  ? ADRENALECTOMY  10/2013  ? CARDIAC CATHETERIZATION  05/01/2014  ? Patent stents, other disease unchanged  ? CARDIAC CATHETERIZATION  04/27/2014  ? Procedure: CORONARY STENT INTERVENTION;  Surgeon: Peter M Martinique, MD;  Location: [facility] CATH LAB;  Service: Cardiovascular;;  DES mid Cx  ?DES mid LAD  ? CARDIAC CATHETERIZATION N/A 03/22/2015  ? Procedure: Left Heart Cath and Coronary Angiography;  Surgeon: Sherren Mocha, MD;  Location: [place] CV LAB;  Service: Cardiovascular;  Laterality: N/A;  ?  CORONARY ANGIOPLASTY WITH STENT PLACEMENT  04/27/2014  ? 3.0 x 16 mm Promus DES to the mid LAD and 3.5 x 28 mm Promus to the mid LCx, otherwise 20-30 percent lesions, EF 55%  ? CORONARY STENT INTERVENTION N/A 01/04/2019  ? Procedure: CORONARY STENT INTERVENTION;  Surgeon: Troy Sine, MD;  Location: [place] CV LAB;  Service: Cardiovascular;  Laterality: N/A;  ? KIDNEY STONE SURGERY  X 1  ? KNEE ARTHROPLASTY Right 1984  ? KNEE ARTHROSCOPY Right X 6  ? LEFT HEART CATH AND CORONARY ANGIOGRAPHY N/A 01/04/2019  ? Procedure: LEFT HEART CATH AND CORONARY ANGIOGRAPHY;  Surgeon: Troy Sine, MD;  Location: [place] CV LAB;  Service: Cardiovascular;  Laterality: N/A;  ? LEFT HEART CATHETERIZATION WITH CORONARY ANGIOGRAM N/A 04/27/2014  ? Procedure: LEFT HEART CATHETERIZATION WITH CORONARY ANGIOGRAM;  Surgeon: Peter M Martinique, MD;  Location: [facility] CATH LAB;  Service: Cardiovascular;  Laterality: N/A;  ? LEFT HEART CATHETERIZATION WITH CORONARY ANGIOGRAM N/A 05/01/2014  ? Procedure: LEFT HEART CATHETERIZATION WITH CORONARY ANGIOGRAM;  Surgeon: Burnell Blanks, MD;  Location[facility] C[facility] LAB;  Service: Cardiovascular;  Laterality: N/A;  ? LITHOTRIPSY  X 2  ?  ? ?Medications Prior to Admission: ?Prior to Admission medications   ?Medication Sig Start Date End Date Taking? Authorizing Provider  ?aspirin EC 81 MG tablet Take 81 mg by mouth daily. ?Patient not taking: Reported on 03/25/2021    [provider]  ?atorvastatin (LIPITOR) 80 MG tablet TAKE ONE TABLET BY MOUTH DAILY ?Patient not taking: Reported on 03/25/2021 02/01/20   Darreld Mclean, PA-C  ?clopidogrel (PLAVIX) 75 MG tablet TAKE ONE TABLET BY MOUTH DAILY ?Patient not taking: Reported on 03/25/2021 08/03/20   Martinique, Peter M, MD  ?diclofenac Sodium (VOLTAREN) 1 % GEL Apply 2 g topically 4 (four) times daily. 08/12/21   Simmons-Robinson, Makiera, MD  ?glipiZIDE (GLUCOTROL) 5 MG tablet Take 1 tablet (5 mg total) by mouth daily. ?Patient not taking: Reported on  03/25/2021 12/27/20   Matilde Haymaker, MD  ?HYDROcodone-acetaminophen (NORCO/VICODIN) 5-325 MG tablet Take 1-2 tablets by mouth every 8 (eight) hours as needed for moderate pain. 12/05/21   [name], DO  ?nitroGLYCERIN (NITROSTAT) 0.4 MG SL tablet Place 1 tablet (0.4 mg total) under the tongue every 5 (five) minutes as needed for chest pain. 01/05/19 03/08/21  Sande Rives E, PA-C  ?ondansetron (ZOFRAN ODT) 4 MG disintegrating tablet Take 1 tablet (4 mg total) by mouth every 8 (eight) hours as needed for nausea or vomiting. ?Patient not taking: Reported on 03/25/2021 08/09/19   Bonnita Hollow, MD  ?pantoprazole (PROTONIX) 40 MG tablet TAKE ONE TABLET BY MOUTH DAILY 07/08/21   [name], DO  ?sertraline (ZOLOFT) 100 MG tablet Take 2 tablets (200 mg total) by mouth daily. ?Patient not taking: Reported on 03/25/2021 12/27/20   Matilde Haymaker, MD  ?sildenafil (VIAGRA) 50 MG tablet  TAKE ONE TABLET BY MOUTH DAILY AS NEEDED FOR ERECTILE DYSFUNCTION 11/04/21   [name], DO  ?  ? ?Allergies:    ?Allergies  ?Allergen Reactions  ? Ozempic (0.25 Or 0.5 Mg-Dose) [Semaglutide(0.25 Or 0.'5mg'$ -Dos)] Nausea And Vomiting and Other (See Comments)  ?  Also had skin reaction with first injection.  Tolerated second shot dermatologically.  GI intolerance lead to > 5 lb. weight loss in two weeks.    ? Cortisone Acetate Swelling  ?  Swelling at injection site  ? Darvocet [Propoxyphene N-Acetaminophen] Nausea And Vomiting  ? Nsaids Nausea And Vomiting and Other (See Comments)  ?  Caused stomach ulcers in the past   ? Xanax [Alprazolam] Other (See Comments)  ?  Pt states causes him to be mean, irritable  ? Tape Itching and Rash  ? ? ?Social History:   ?Social History  ? ?Socioeconomic History  ? Marital status: Divorced  ?  Spouse name: Not on file  ? Number of children: Not on file  ? Years of education: Not on file  ? Highest education level: Not on file  ?Occupational History  ? Occupation: Unemployed  ?Tobacco Use  ? Smoking status: Never   ? Smokeless tobacco: Never  ?Vaping Use  ? Vaping Use: Never used  ?Substance and Sexual Activity  ? Alcohol use: Yes  ?  Alcohol/week: 0.0 standard drinks  ?  Comment: "seldom" per patient  ? Drug use: Yes

## 2021-12-11 NOTE — ED Provider Notes (Signed)
?[facility] ?Provider Note ? ? ?CSN: [id number] ?Arrival date & time: 12/11/21  1255 ? ?  ? ?History ? ?Chief Complaint  ?Patient presents with  ? Tachycardia  ? ? ?Andrew Huerta is a 56 y.o. male. ? ?HPI ?Brought in by EMS for fast heart rate.  Has been feeling bad since last night.  Found to be in ventricular tachycardia.  Wide-complex tachycardia at around 260 beats a minute.  Had been given 2 infusions of 150 mg of amiodarone by EMS.  Had some decrease in the heart rate by about 30 but no change of the rhythm.  Pressures been hypotensive for them and somewhat hypotensive upon arrival.  Patient is pale and feels bad.  Denies chest pain.  States he just feels bad and is cold.  Did take a Viagra last night.  Some history coronary artery disease but no arrhythmia history. ?  ? ?Home Medications ?Prior to Admission medications   ?Medication Sig Start Date End Date Taking? Authorizing Provider  ?aspirin EC 81 MG tablet Take 81 mg by mouth daily. ?Patient not taking: Reported on 03/25/2021    [provider]  ?atorvastatin (LIPITOR) 80 MG tablet TAKE ONE TABLET BY MOUTH DAILY ?Patient not taking: Reported on 03/25/2021 02/01/20   Darreld Mclean, PA-C  ?clopidogrel (PLAVIX) 75 MG tablet TAKE ONE TABLET BY MOUTH DAILY ?Patient not taking: Reported on 03/25/2021 08/03/20   Martinique, Peter M, MD  ?diclofenac Sodium (VOLTAREN) 1 % GEL Apply 2 g topically 4 (four) times daily. 08/12/21   Simmons-Robinson, Makiera, MD  ?glipiZIDE (GLUCOTROL) 5 MG tablet Take 1 tablet (5 mg total) by mouth daily. ?Patient not taking: Reported on 03/25/2021 12/27/20   Matilde Haymaker, MD  ?HYDROcodone-acetaminophen (NORCO/VICODIN) 5-325 MG tablet Take 1-2 tablets by mouth every 8 (eight) hours as needed for moderate pain. 12/05/21   [name], DO  ?nitroGLYCERIN (NITROSTAT) 0.4 MG SL tablet Place 1 tablet (0.4 mg total) under the tongue every 5 (five) minutes as needed for chest pain. 01/05/19 03/08/21   Sande Rives E, PA-C  ?ondansetron (ZOFRAN ODT) 4 MG disintegrating tablet Take 1 tablet (4 mg total) by mouth every 8 (eight) hours as needed for nausea or vomiting. ?Patient not taking: Reported on 03/25/2021 08/09/19   Bonnita Hollow, MD  ?pantoprazole (PROTONIX) 40 MG tablet TAKE ONE TABLET BY MOUTH DAILY 07/08/21   [name], DO  ?sertraline (ZOLOFT) 100 MG tablet Take 2 tablets (200 mg total) by mouth daily. ?Patient not taking: Reported on 03/25/2021 12/27/20   Matilde Haymaker, MD  ?sildenafil (VIAGRA) 50 MG tablet TAKE ONE TABLET BY MOUTH DAILY AS NEEDED FOR ERECTILE DYSFUNCTION 11/04/21   [name], DO  ?   ? ?Allergies    ?Ozempic (0.25 or 0.5 mg-dose) [semaglutide(0.25 or 0.'5mg'$ -dos)], Cortisone acetate, Darvocet [propoxyphene n-acetaminophen], Nsaids, Xanax [alprazolam], and Tape   ? ?Review of Systems   ?Review of Systems  ?Constitutional:  Positive for appetite change and chills.  ?Cardiovascular:  Positive for palpitations.  ? ?Physical Exam ?Updated Vital Signs ?BP (!) 134/104   Pulse 67   Temp (S) (!) 96.5 ?F (35.8 ?C) (Rectal)   Resp 13   SpO2 98%  ?Physical Exam ?Constitutional:   ?   Appearance: He is diaphoretic.  ?Eyes:  ?   Pupils: Pupils are equal, round, and reactive to light.  ?Cardiovascular:  ?   Rate and Rhythm: Tachycardia present.  ?Pulmonary:  ?   Breath sounds: No wheezing or  rhonchi.  ?Abdominal:  ?   Tenderness: There is no abdominal tenderness.  ?Skin: ?   Coloration: Skin is pale.  ?Neurological:  ?   Comments: Somewhat slow to answer.  Does otherwise appear appropriate.  ? ? ?ED Results / Procedures / Treatments   ?Labs ?(all labs ordered are listed, but only abnormal results are displayed) ?Labs Reviewed  ?COMPREHENSIVE METABOLIC PANEL - Abnormal; Notable for the following components:  ?    Result Value  ? CO2 14 (*)   ? Glucose, Bld 346 (*)   ? BUN 21 (*)   ? Creatinine, Ser 1.75 (*)   ? Calcium 8.3 (*)   ? Total Protein 5.8 (*)   ? Albumin 3.4 (*)   ? AST 43 (*)   ?  Total Bilirubin 1.4 (*)   ? GFR, Estimated 45 (*)   ? All other components within normal limits  ?CBC WITH DIFFERENTIAL/PLATELET - Abnormal; Notable for the following components:  ? WBC 21.9 (*)   ? RBC 4.10 (*)   ? Hemoglobin 12.9 (*)   ? Platelets 149 (*)   ? Neutro Abs 17.2 (*)   ? Monocytes Absolute 1.2 (*)   ? Abs Immature Granulocytes 1.31 (*)   ? All other components within normal limits  ?CBG MONITORING, ED - Abnormal; Notable for the following components:  ? Glucose-Capillary 357 (*)   ? All other components within normal limits  ?TROPONIN I (HIGH SENSITIVITY) - Abnormal; Notable for the following components:  ? Troponin I (High Sensitivity) 196 (*)   ? All other components within normal limits  ?CULTURE, BLOOD (ROUTINE X 2)  ?CULTURE, BLOOD (ROUTINE X 2)  ?URINE CULTURE  ?TSH  ?MAGNESIUM  ?RAPID URINE DRUG SCREEN, HOSP PERFORMED  ?LACTIC ACID, PLASMA  ?LACTIC ACID, PLASMA  ?RAPID URINE DRUG SCREEN, HOSP PERFORMED  ?URINALYSIS, COMPLETE (UACMP) WITH MICROSCOPIC  ?TROPONIN I (HIGH SENSITIVITY)  ? ? ?EKG ?EKG Interpretation ? ?Date/Time:  Wednesday December 11 2021 13:08:33 EDT ?Ventricular Rate:  74 ?PR Interval:  173 ?QRS Duration: 90 ?QT Interval:  358 ?QTC Calculation: 398 ?R Axis:   98 ?Text Interpretation: Sinus rhythm Borderline right axis deviation Repol abnrm suggests ischemia, lateral leads nonspecific? inferior and anterior T wave changes This EKG was? immediately post Cardioversion for V-tach Confirmed by Davonna Belling [phone]) on 12/11/2021 1:16:13 PM ? ?Radiology ?DG Chest Portable 1 View ? ?Result Date: 12/11/2021 ?CLINICAL DATA:  Shortness of breath EXAM: PORTABLE CHEST 1 VIEW COMPARISON:  02/25/2021 FINDINGS: Cardiomegaly. Defibrillator leads applied. Both lungs are clear. The visualized skeletal structures are unremarkable. IMPRESSION: Cardiomegaly without acute abnormality of the lungs in AP portable projection. Electronically Signed   By: Delanna Ahmadi M.D.   On: 12/11/2021 13:29    ? ?Procedures ?.Sedation ? ?Date/Time: 12/11/2021 1:36 PM ?Performed by: Davonna Belling, MD ?Authorized by: Davonna Belling, MD  ? ?Consent:  ?  Consent obtained:  Verbal and emergent situation ?  Consent given by:  Patient ?  Alternatives discussed:  Anxiolysis and analgesia without sedation ?Universal protocol:  ?  Immediately prior to procedure, a time out was called: yes   ?Pre-sedation assessment:  ?  Time since last food or drink:  Unknown ?  ASA classification: class 2 - patient with mild systemic disease   ?  Mallampati score:  Unable to assess ?  Pre-sedation assessments completed and reviewed: airway patency, hydration status and mental status   ?Immediate pre-procedure details:  ?  Reviewed: vital signs   ?  Verified: bag valve mask available, emergency equipment available, intubation equipment available, IV patency confirmed, oxygen available and suction available   ?Procedure details (see MAR for exact dosages):  ?  Preoxygenation:  Nasal cannula ?  Sedation:  Etomidate ?  Intended level of sedation: deep ?  Analgesia:  None ?  Intra-procedure monitoring:  Blood pressure monitoring, frequent LOC assessments, frequent vital sign checks, cardiac monitor and continuous pulse oximetry ?  Intra-procedure events: none   ?  Total Provider sedation time (minutes):  10 ?Post-procedure details:  ?  Post-sedation assessment completed:  12/11/2021 1:38 PM ?  Attendance: Constant attendance by certified staff until patient recovered   ?  Recovery: Patient returned to pre-procedure baseline   ?  Patient is stable for discharge or admission: no   ?  Procedure completion:  Tolerated well, no immediate complications ?.Cardioversion ? ?Date/Time: 12/11/2021 1:38 PM ?Performed by: Davonna Belling, MD ?Authorized by: Davonna Belling, MD  ? ?Consent:  ?  Consent obtained:  Verbal ?  Consent given by:  Patient ?  Risks discussed:  Cutaneous burn, death, induced arrhythmia and pain ?  Alternatives discussed:  No  treatment, rate-control medication and alternative treatment ?Pre-procedure details:  ?  Cardioversion basis:  Emergent ?  Rhythm:  Ventricular tachycardia ?  Electrode placement:  Anterior-posterior ?Patient sedated: Yes. R

## 2021-12-11 NOTE — ED Notes (Signed)
Report given to Miquel Dunn, RN of [phone] ?

## 2021-12-11 NOTE — Telephone Encounter (Signed)
Patient calls nurse line reporting chest pains and rapid heart rate.  ? ?Patient reports the symptoms began last night and have not improved. Patient reports dizziness, nausea/vomiting, excessive sweating, rapid heart beat, chest tightness and SOB with exertion.  ? ?Patient advised to go to ED for evaluation.  ? ?Patient agreed with plan.  ?

## 2021-12-11 NOTE — Plan of Care (Signed)
?  Problem: Clinical Measurements: ?Goal: Ability to maintain clinical measurements within normal limits will improve ?Outcome: Progressing ?Goal: Will remain free from infection ?Outcome: Progressing ?Goal: Diagnostic test results will improve ?Outcome: Progressing ?Goal: Cardiovascular complication will be avoided ?Outcome: Progressing ?  ?Problem: Activity: ?Goal: Risk for activity intolerance will decrease ?Outcome: Progressing ?  ?Problem: Coping: ?Goal: Level of anxiety will decrease ?Outcome: Progressing ?  ?Problem: Safety: ?Goal: Ability to remain free from injury will improve ?Outcome: Progressing ?  ?Problem: Skin Integrity: ?Goal: Risk for impaired skin integrity will decrease ?Outcome: Progressing ?  ?

## 2021-12-11 NOTE — ED Triage Notes (Signed)
Pt arrived via [facility] with c/c of rapid heart rate. Pt hasn't been feeling well since last night. Pt has hx of MI. ? ?'300mg'$  amiodarone, '4mg'$  [id number], 1055m NSS,  ?

## 2021-12-12 ENCOUNTER — Encounter (HOSPITAL_COMMUNITY)
Admission: EM | Disposition: A | Discharge status: Home / Self Care | Payer: Self-pay | Source: Home / Self Care | Attending: Cardiovascular Disease

## 2021-12-12 ENCOUNTER — Other Ambulatory Visit (HOSPITAL_COMMUNITY): Payer: Self-pay

## 2021-12-12 ENCOUNTER — Encounter (HOSPITAL_COMMUNITY): Payer: Self-pay | Admitting: Cardiovascular Disease

## 2021-12-12 DIAGNOSIS — I5021 Acute systolic (congestive) heart failure: Secondary | ICD-10-CM | POA: Diagnosis not present

## 2021-12-12 DIAGNOSIS — I472 Ventricular tachycardia, unspecified: Secondary | ICD-10-CM | POA: Diagnosis not present

## 2021-12-12 DIAGNOSIS — I2510 Atherosclerotic heart disease of native coronary artery without angina pectoris: Secondary | ICD-10-CM

## 2021-12-12 DIAGNOSIS — R570 Cardiogenic shock: Secondary | ICD-10-CM | POA: Diagnosis not present

## 2021-12-12 HISTORY — PX: RIGHT/LEFT HEART CATH AND CORONARY ANGIOGRAPHY: CATH118266

## 2021-12-12 LAB — LIPID PANEL
Cholesterol: 197 mg/dL (ref 0–200)
HDL: 48 mg/dL (ref >40)
LDL Cholesterol: 130 mg/dL — ABNORMAL HIGH (ref 0–99)
Total CHOL/HDL Ratio: 4.1 RATIO
Triglycerides: 95 mg/dL (ref <150)
VLDL: 19 mg/dL (ref 0–40)

## 2021-12-12 LAB — POCT I-STAT 7, (LYTES, BLD GAS, ICA,H+H)
Acid-base deficit: 1 mmol/L (ref 0.0–2.0)
Bicarbonate: 23 mmol/L (ref 20.0–28.0)
Calcium, Ion: 1.11 mmol/L — ABNORMAL LOW (ref 1.15–1.40)
HCT: 33 % — ABNORMAL LOW (ref 39.0–52.0)
Hemoglobin: 11.2 g/dL — ABNORMAL LOW (ref 13.0–17.0)
O2 Saturation: 97 %
Potassium: 3.8 mmol/L (ref 3.5–5.1)
Sodium: 141 mmol/L (ref 135–145)
TCO2: 24 mmol/L (ref 22–32)
pCO2 arterial: 34.6 mmHg (ref 32–48)
pH, Arterial: 7.429 (ref 7.35–7.45)
pO2, Arterial: 92 mmHg (ref 83–108)

## 2021-12-12 LAB — GLUCOSE, CAPILLARY
Glucose-Capillary: 150 mg/dL — ABNORMAL HIGH (ref 70–99)
Glucose-Capillary: 155 mg/dL — ABNORMAL HIGH (ref 70–99)
Glucose-Capillary: 179 mg/dL — ABNORMAL HIGH (ref 70–99)
Glucose-Capillary: 132 mg/dL — ABNORMAL HIGH (ref 70–99)
Glucose-Capillary: 103 mg/dL — ABNORMAL HIGH (ref 70–99)

## 2021-12-12 LAB — LACTIC ACID, PLASMA: Lactic Acid, Venous: 1.1 mmol/L (ref 0.5–1.9)

## 2021-12-12 LAB — POCT I-STAT EG7
Acid-Base Excess: 0 mmol/L (ref 0.0–2.0)
Acid-Base Excess: 0 mmol/L (ref 0.0–2.0)
Bicarbonate: 24.6 mmol/L (ref 20.0–28.0)
Bicarbonate: 25.5 mmol/L (ref 20.0–28.0)
Calcium, Ion: 1.14 mmol/L — ABNORMAL LOW (ref 1.15–1.40)
Calcium, Ion: 1.2 mmol/L (ref 1.15–1.40)
HCT: 34 % — ABNORMAL LOW (ref 39.0–52.0)
HCT: 34 % — ABNORMAL LOW (ref 39.0–52.0)
Hemoglobin: 11.6 g/dL — ABNORMAL LOW (ref 13.0–17.0)
Hemoglobin: 11.6 g/dL — ABNORMAL LOW (ref 13.0–17.0)
O2 Saturation: 70 %
O2 Saturation: 71 %
Potassium: 3.8 mmol/L (ref 3.5–5.1)
Potassium: 4 mmol/L (ref 3.5–5.1)
Sodium: 139 mmol/L (ref 135–145)
Sodium: 141 mmol/L (ref 135–145)
TCO2: 26 mmol/L (ref 22–32)
TCO2: 27 mmol/L (ref 22–32)
pCO2, Ven: 40.4 mmHg — ABNORMAL LOW (ref 44–60)
pCO2, Ven: 41.8 mmHg — ABNORMAL LOW (ref 44–60)
pH, Ven: 7.392 (ref 7.25–7.43)
pH, Ven: 7.394 (ref 7.25–7.43)
pO2, Ven: 37 mmHg (ref 32–45)
pO2, Ven: 38 mmHg (ref 32–45)

## 2021-12-12 LAB — BASIC METABOLIC PANEL
Anion gap: 6 (ref 5–15)
BUN: 23 mg/dL — ABNORMAL HIGH (ref 6–20)
CO2: 21 mmol/L — ABNORMAL LOW (ref 22–32)
Calcium: 8.9 mg/dL (ref 8.9–10.3)
Chloride: 111 mmol/L (ref 98–111)
Creatinine, Ser: 1.1 mg/dL (ref 0.61–1.24)
GFR, Estimated: >60 mL/min (ref >60)
Glucose, Bld: 179 mg/dL — ABNORMAL HIGH (ref 70–99)
Potassium: 4.3 mmol/L (ref 3.5–5.1)
Sodium: 138 mmol/L (ref 135–145)

## 2021-12-12 LAB — CBC
HCT: 36.8 % — ABNORMAL LOW (ref 39.0–52.0)
Hemoglobin: 12.5 g/dL — ABNORMAL LOW (ref 13.0–17.0)
MCH: 30.7 pg (ref 26.0–34.0)
MCHC: 34 g/dL (ref 30.0–36.0)
MCV: 90.4 fL (ref 80.0–100.0)
Platelets: 168 10*3/uL (ref 150–400)
RBC: 4.07 MIL/uL — ABNORMAL LOW (ref 4.22–5.81)
RDW: 13 % (ref 11.5–15.5)
WBC: 16.3 10*3/uL — ABNORMAL HIGH (ref 4.0–10.5)
nRBC: 0 % (ref 0.0–0.2)

## 2021-12-12 LAB — SURGICAL PCR SCREEN
MRSA, PCR: NEGATIVE
Staphylococcus aureus: NEGATIVE

## 2021-12-12 LAB — PROCALCITONIN: Procalcitonin: <0.1 ng/mL

## 2021-12-12 SURGERY — RIGHT/LEFT HEART CATH AND CORONARY ANGIOGRAPHY
Anesthesia: LOCAL

## 2021-12-12 MED ORDER — ONDANSETRON HCL 4 MG/2ML IJ SOLN
4.0000 mg | Freq: Four times a day (QID) | INTRAMUSCULAR | Status: DC | PRN
Start: 2021-12-12 — End: 2021-12-14

## 2021-12-12 MED ORDER — FENTANYL CITRATE (PF) 100 MCG/2ML IJ SOLN
INTRAMUSCULAR | Status: AC
Start: 2021-12-12 — End: ?
  Filled 2021-12-12: qty 2

## 2021-12-12 MED ORDER — SODIUM CHLORIDE 0.9 % IV SOLN: INTRAVENOUS | Status: DC

## 2021-12-12 MED ORDER — SODIUM CHLORIDE 0.9 % IV SOLN
80.0000 mg | INTRAVENOUS | Status: DC
  Filled 2021-12-12: qty 2

## 2021-12-12 MED ORDER — LIDOCAINE HCL (PF) 1 % IJ SOLN
INTRAMUSCULAR | Status: AC
  Filled 2021-12-12: qty 30

## 2021-12-12 MED ORDER — SODIUM CHLORIDE 0.9% FLUSH
3.0000 mL | Freq: Two times a day (BID) | INTRAVENOUS | Status: DC
  Administered 2021-12-12 – 2021-12-14 (×4): 3 mL via INTRAVENOUS

## 2021-12-12 MED ORDER — HEPARIN (PORCINE) IN NACL 1000-0.9 UT/500ML-% IV SOLN
INTRAVENOUS | Status: AC
  Filled 2021-12-12: qty 1000

## 2021-12-12 MED ORDER — LIDOCAINE HCL (PF) 1 % IJ SOLN
INTRAMUSCULAR | Status: DC | PRN
  Administered 2021-12-12 (×2): 2 mL via INTRADERMAL

## 2021-12-12 MED ORDER — LABETALOL HCL 5 MG/ML IV SOLN: 10.0000 mg | INTRAVENOUS | Status: AC | PRN

## 2021-12-12 MED ORDER — HEPARIN (PORCINE) IN NACL 2-0.9 UNITS/ML
INTRAMUSCULAR | Status: DC | PRN
  Administered 2021-12-12: 10 mL via INTRA_ARTERIAL

## 2021-12-12 MED ORDER — AMIODARONE HCL IN DEXTROSE 360-4.14 MG/200ML-% IV SOLN
INTRAVENOUS | Status: AC
Start: 2021-12-12 — End: ?
  Filled 2021-12-12: qty 200

## 2021-12-12 MED ORDER — CHLORHEXIDINE GLUCONATE 4 % EX LIQD
60.0000 mL | Freq: Once | CUTANEOUS | Status: DC
  Filled 2021-12-12: qty 60

## 2021-12-12 MED ORDER — ACETAMINOPHEN 325 MG PO TABS: 650.0000 mg | ORAL_TABLET | ORAL | Status: DC | PRN

## 2021-12-12 MED ORDER — CEFAZOLIN SODIUM-DEXTROSE 2-4 GM/100ML-% IV SOLN: 2.0000 g | INTRAVENOUS | Status: DC

## 2021-12-12 MED ORDER — HEPARIN SODIUM (PORCINE) 1000 UNIT/ML IJ SOLN
INTRAMUSCULAR | Status: AC
Start: 2021-12-12 — End: ?
  Filled 2021-12-12: qty 10

## 2021-12-12 MED ORDER — AMIODARONE HCL 200 MG PO TABS
400.0000 mg | ORAL_TABLET | Freq: Two times a day (BID) | ORAL | Status: DC
  Administered 2021-12-12 – 2021-12-14 (×5): 400 mg via ORAL
  Filled 2021-12-12 (×5): qty 2

## 2021-12-12 MED ORDER — SODIUM CHLORIDE 0.9% FLUSH: 3.0000 mL | INTRAVENOUS | Status: DC | PRN

## 2021-12-12 MED ORDER — HYDRALAZINE HCL 20 MG/ML IJ SOLN: 10.0000 mg | INTRAMUSCULAR | Status: AC | PRN

## 2021-12-12 MED ORDER — HEPARIN SODIUM (PORCINE) 1000 UNIT/ML IJ SOLN
INTRAMUSCULAR | Status: DC | PRN
  Administered 2021-12-12: 3500 [IU] via INTRAVENOUS

## 2021-12-12 MED ORDER — SODIUM CHLORIDE 0.9 % IV SOLN: 250.0000 mL | INTRAVENOUS | Status: DC | PRN

## 2021-12-12 MED ORDER — SODIUM CHLORIDE 0.9 % IV SOLN: 250.0000 mL | INTRAVENOUS | Status: DC

## 2021-12-12 MED ORDER — MIDAZOLAM HCL 2 MG/2ML IJ SOLN
INTRAMUSCULAR | Status: AC
  Filled 2021-12-12: qty 2

## 2021-12-12 MED ORDER — ENOXAPARIN SODIUM 40 MG/0.4ML IJ SOSY: 40.0000 mg | PREFILLED_SYRINGE | INTRAMUSCULAR | Status: DC

## 2021-12-12 MED ORDER — SODIUM CHLORIDE 0.9 % IV SOLN: INTRAVENOUS | Status: AC

## 2021-12-12 MED ORDER — CHLORHEXIDINE GLUCONATE 4 % EX LIQD
60.0000 mL | Freq: Once | CUTANEOUS | Status: AC
  Administered 2021-12-12: 4 via TOPICAL
  Filled 2021-12-12: qty 60

## 2021-12-12 MED ORDER — IOHEXOL 350 MG/ML SOLN
INTRAVENOUS | Status: DC | PRN
  Administered 2021-12-12: 40 mL

## 2021-12-12 MED ORDER — SODIUM CHLORIDE 0.9% FLUSH
3.0000 mL | Freq: Two times a day (BID) | INTRAVENOUS | Status: DC
  Administered 2021-12-12 – 2021-12-13 (×2): 3 mL via INTRAVENOUS

## 2021-12-12 MED ORDER — VERAPAMIL HCL 2.5 MG/ML IV SOLN
INTRAVENOUS | Status: AC
  Filled 2021-12-12: qty 2

## 2021-12-12 MED ORDER — HEPARIN (PORCINE) IN NACL 1000-0.9 UT/500ML-% IV SOLN
INTRAVENOUS | Status: DC | PRN
  Administered 2021-12-12 (×2): 500 mL

## 2021-12-12 MED ORDER — FENTANYL CITRATE (PF) 100 MCG/2ML IJ SOLN
INTRAMUSCULAR | Status: DC | PRN
  Administered 2021-12-12 (×3): 25 ug via INTRAVENOUS

## 2021-12-12 SURGICAL SUPPLY — 12 items
CATH 5FR JL3.5 JR4 ANG PIG MP (CATHETERS) ×1 IMPLANT
CATH BALLN WEDGE 5F 110CM (CATHETERS) ×1 IMPLANT
DEVICE RAD COMP TR BAND LRG (VASCULAR PRODUCTS) ×1 IMPLANT
GLIDESHEATH SLEND SS 6F .021 (SHEATH) ×1 IMPLANT
GUIDEWIRE INQWIRE 1.5J.035X260 (WIRE) IMPLANT
INQWIRE 1.5J .035X260CM (WIRE) ×2
KIT MICROPUNCTURE NIT STIFF (SHEATH) ×1 IMPLANT
PACK CARDIAC CATHETERIZATION (CUSTOM PROCEDURE TRAY) ×2 IMPLANT
SHEATH GLIDE SLENDER 4/5FR (SHEATH) ×1 IMPLANT
SHEATH PROBE COVER 6X72 (BAG) ×1 IMPLANT
TRANSDUCER W/STOPCOCK (MISCELLANEOUS) ×2 IMPLANT
WIRE MICROINTRODUCER 60CM (WIRE) ×1 IMPLANT

## 2021-12-12 NOTE — Progress Notes (Signed)
?  Transition of Care (TOC) Screening Note ? ? ?Patient Details  ?Name: Andrew Huerta ?Date of Birth: Apr 24, 1966 ? ? ?Transition of Care (TOC) CM/SW Contact:    ?Milas Gain, LCSWA ?Phone Number: ?12/12/2021, 11:26 AM ? ? ? ?Transition of Care Department [facility]) has reviewed patient and no TOC needs have been identified at this time. We will continue to monitor patient advancement through interdisciplinary progression rounds. If new patient transition needs arise, please place a TOC consult. ?  ?

## 2021-12-12 NOTE — Consult Note (Addendum)
?Cardiology Consultation:  ? ?Patient ID: Andrew Huerta ?MRN: [id number]; DOB: [birth year] ? ?Admit date: 12/11/2021 ?Date of Consult: 12/12/2021 ? ?PCP:  Andrew Del, DO ?  ?[facility] Providers ?Cardiologist:  Peter Martinique, MD   { ? ? ?Patient Profile:  ? ?Andrew Huerta is a 56 y.o. male with a hx of CAD (PCI to LAD/LCx 2015, PCI to RCA 2020), DM,  Barrett's esophagus,  adrenal tumor s/p adrenal gland resection at [facility], HTN, HLD, primary hyperaldosteronism s/p adrenalectomy, and OSA, who is being seen 12/12/2021 for the evaluation of VT at the request of Dr. Marisue Ivan. ? ?Syncopal episode 01/2020, preceded by weakness, tunnel vision, diaphoresis, associated with "some convulsions", neg neuro w/u, monitoring was unrevealing, normal sinus rhythm, rare PACs occasional PVCs burden 4%, echo w/LVEF 40-45% ? ?History of Present Illness:  ? ?[name] was admitted yesterday with onste the evening prior not feeling well, weakness, chills, nausea, vomiting, palpitations and SOB. Did have chest pain, but reports chronic chest pain (CP reported as chronic and unchanged), called EMS, they found him in VT perhaps to the 160's and treated w/IV amio ?In the ER cardioverted with one shock and admitted, started on amiodarone gtt with plans to cath once renal function allowed ? ?AKI suspect 2/2 prolonged VT (12 hours or so of symptoms) ?Elevated WBC, cultures done, though also suspected to be reactive to VT ? ? ?LABS ?K+ 4.4 ?Mag 1.8 ?BUN/Creat 21/1.75 >> 23/1.10 ?HS Trop 196 > 265 ?Lactic acid 4.9 > 3.8 ?WBC 21.9 >> 16.3 ?H/H 12/36 ?Plts 168 ?TSH 0.624 ? ?TTE with LVEF 20-25%, multiple WMA, mild reduction in RVEF ? ?Cath today ?Mid LAD lesion is 30% stenosed. ?  Mid RCA to Dist RCA lesion is 100% stenosed. ?  Mid RCA lesion is 99% stenosed. ?  Prox RCA lesion is 40% stenosed. ?  Ost LAD to Prox LAD lesion is 90% stenosed. ?  Non-stenotic Prox LAD lesion was previously treated. ?  Non-stenotic Prox LAD to Mid LAD lesion was  previously treated. ?  Non-stenotic Ost Cx to Prox Cx lesion was previously treated. ?  The left ventricular ejection fraction is 25-35% by visual estimate. ?  ?Findings: ?  ?Ao = 110/68 (86) ?LV = 115/16 ?RA = 10  ?RV = 30/13 ?PA = 28/15 (21) ?PCW = 14 ?Fick cardiac output/index = 5.2/2.9 ?PVR = 1.3 WU ?FA sat = 97% ?PA sat = 69%, 70% ?PAPi = 1.3 ?  ?Assessment: ?1. 3v CAD with 90% lesion in proximal LAD prior to previous stent otherwise stable CAD with patency of previous LAD & LCx stents ?2. iCM EF 30-35% ?3. Relatively well-compensated hemodynamics with evidence of RV dysfunction ?  ?Plan/Discussion:  ? Suspect VT is scar-mediated and will need ICD. However, he also has significant progression of CAD in proximal LAD and will need PCI/atherectomy of this area. We will coordinate timing of ICD and PCI procedures to minimize risk. ? ?VT is not felt to be ischemic driven, he did not have ACS, with likely scar mediated VT EP is asked to see him for consideration of an ICD, with perhaps planned/staged LAD intervention post implant to allow interruption of his DAPT ? ?EMS reviewed ?On their arrival pt was AAO x4, diaphoretic, , nauseous and voimiting, reported onset the night prior after taking Viagra, found in VT, rates reported 100's, zofran given anio bolus as well, in route remained in VT and a 2nd amio bolus given, stable BP and no loss of pulse ? ?  On arrival to ER cardioverted with 150J x1 ? ?He is feeling better today. ?He reports a chronic subtle chest discomfort that has not changed in years, particularly not of late ?No unusual symptoms, changes, admits to intermittently missing meds, nothing unusual the day sprior to his symptoms starting. ?He has hx of syncope, perhaps twice. ?The last a year ish ago, seated at dinner, started to feel poorly, lightheaded and fainted, no particular palpitations or cardiac awareness. ?Woke perhaps several seconds later, feeling weak ?He does reports lightheaded episodes that  he feels like he has to grab onto something to steady himself, and dizzy with some movements. ?With these he wil sit and it passes. ?He has never felt like he did yesterday ever in the past ? ?Does not sound like he has had abrupt syncope ? ?Past Medical History:  ?Diagnosis Date  ? Adrenal tumor   ? a. s/p adrenal gland resection at [facility]. left side removal per patient  ? Anxiety   ? Barrett's esophagus   ? EGD - 11/27/09 - short segment of Barrett's  ? CAD (coronary artery disease)   ? a. 04/27/14 [place] s/p DES to mLAD and DES to mLCx  ? Chronic back pain   ? upper and lower per patient  ? Chronic chest pain   ? Degenerative joint disease   ? Bilateral knees. Significant knee pain since playing football in high school. also in back per patient  ? Depression   ? Diabetes mellitus type 2, controlled ([place]) 08/29/2015  ? Diagnosed by A1c 7.6% during 08/26/15 hospital admission for chest pain  ? Gastroesophageal reflux disease with hiatal hernia   ? GERD (gastroesophageal reflux disease)   ? Hiatal hernia   ? EGD - 11/27/2009  ? Hyperlipidemia   ? Hypertension   ? Low back pain   ? Primary hyperaldosteronism ([place]) 01/22/2012  ? S/P adrenalectomy     ? PTSD (post-traumatic stress disorder)   ? Seizures ([place])   ? Sleep apnea   ? Stone, kidney   ? Syncope 04/10/2019  ? ? ?Past Surgical History:  ?Procedure Laterality Date  ? ADRENALECTOMY  10/2013  ? CARDIAC CATHETERIZATION  05/01/2014  ? Patent stents, other disease unchanged  ? CARDIAC CATHETERIZATION  04/27/2014  ? Procedure: CORONARY STENT INTERVENTION;  Surgeon: Peter M Martinique, MD;  Location: [facility] CATH LAB;  Service: Cardiovascular;;  DES mid Cx  ?DES mid LAD  ? CARDIAC CATHETERIZATION N/A 03/22/2015  ? Procedure: Left Heart Cath and Coronary Angiography;  Surgeon: Sherren Mocha, MD;  Location: [facility] CV LAB;  Service: Cardiovascular;  Laterality: N/A;  ? CORONARY ANGIOPLASTY WITH STENT PLACEMENT  04/27/2014  ? 3.0 x 16 mm Promus DES to the mid LAD and 3.5 x 28 mm Promus to  the mid LCx, otherwise 20-30 percent lesions, EF 55%  ? CORONARY STENT INTERVENTION N/A 01/04/2019  ? Procedure: CORONARY STENT INTERVENTION;  Surgeon: Troy Sine, MD;  Location: [place] CV LAB;  Service: Cardiovascular;  Laterality: N/A;  ? KIDNEY STONE SURGERY  X 1  ? KNEE ARTHROPLASTY Right 1984  ? KNEE ARTHROSCOPY Right X 6  ? LEFT HEART CATH AND CORONARY ANGIOGRAPHY N/A 01/04/2019  ? Procedure: LEFT HEART CATH AND CORONARY ANGIOGRAPHY;  Surgeon: Troy Sine, MD;  Location: [place] CV LAB;  Service: Cardiovascular;  Laterality: N/A;  ? LEFT HEART CATHETERIZATION WITH CORONARY ANGIOGRAM N/A 04/27/2014  ? Procedure: LEFT HEART CATHETERIZATION WITH CORONARY ANGIOGRAM;  Surgeon: Peter M Martinique, MD;  Location:  [place] CATH LAB;  Service: Cardiovascular;  Laterality: N/A;  ? LEFT HEART CATHETERIZATION WITH CORONARY ANGIOGRAM N/A 05/01/2014  ? Procedure: LEFT HEART CATHETERIZATION WITH CORONARY ANGIOGRAM;  Surgeon: Burnell Blanks, MD;  Location: [facility] CATH LAB;  Service: Cardiovascular;  Laterality: N/A;  ? LITHOTRIPSY  X 2  ?  ? ?Home Medications:  ?Prior to Admission medications   ?Medication Sig Start Date End Date Taking? Authorizing Provider  ?aspirin EC 81 MG tablet Take 81 mg by mouth daily.   Yes [provider]  ?diclofenac Sodium (VOLTAREN) 1 % GEL Apply 2 g topically 4 (four) times daily. ?Patient taking differently: Apply 2 g topically daily as needed (For pain). 08/12/21  Yes Simmons-Robinson, Makiera, MD  ?HYDROcodone-acetaminophen (NORCO/VICODIN) 5-325 MG tablet Take 1-2 tablets by mouth every 8 (eight) hours as needed for moderate pain. 12/05/21  Yes Andrew Del, DO  ?nitroGLYCERIN (NITROSTAT) 0.4 MG SL tablet Place 1 tablet (0.4 mg total) under the tongue every 5 (five) minutes as needed for chest pain. 01/05/19 12/11/21 Yes Goodrich, Callie E, PA-C  ?sildenafil (VIAGRA) 50 MG tablet TAKE ONE TABLET BY MOUTH DAILY AS NEEDED FOR ERECTILE DYSFUNCTION ?Patient taking differently: Take 50  mg by mouth daily as needed for erectile dysfunction. 11/04/21  Yes Welborn, Ryan, DO  ?atorvastatin (LIPITOR) 80 MG tablet TAKE ONE TABLET BY MOUTH DAILY ?Patient not taking: Reported on 03/25/2021 02/01/20

## 2021-12-12 NOTE — Progress Notes (Signed)
?Cardiology Progress Note  ?Patient ID: Andrew Huerta ?MRN: [id number] ?DOB: 1966-07-26 ?Date of Encounter: 12/12/2021 ? ?Primary Cardiologist: Peter Martinique, MD ? ?Subjective  ? ?Chief Complaint: Fatigue ? ?HPI: No further ventricular tachycardia.  Quite fatigued this morning.  Denies any infectious symptoms.  Symptoms slowly improving. ? ?ROS:  ?All other ROS reviewed and negative. Pertinent positives noted in the HPI.    ? ?Inpatient Medications  ?Scheduled Meds: ? [START ON 12/13/2021] aspirin EC  81 mg Oral Daily  ? atorvastatin  80 mg Oral Daily  ? Chlorhexidine Gluconate Cloth  6 each Topical Daily  ? clopidogrel  75 mg Oral Daily  ? glipiZIDE  5 mg Oral Daily  ? hydrALAZINE  25 mg Oral Q8H  ? insulin aspart  0-24 Units Subcutaneous TID WC  ? insulin aspart  0-5 Units Subcutaneous QHS  ? isosorbide mononitrate  30 mg Oral Daily  ? pantoprazole  40 mg Oral Daily  ? pneumococcal 20-valent conjugate vaccine  0.5 mL Intramuscular Tomorrow-1000  ? sertraline  200 mg Oral Daily  ? sodium chloride flush  3 mL Intravenous Q12H  ? sodium chloride flush  3 mL Intravenous Q12H  ? ?Continuous Infusions: ? sodium chloride    ? sodium chloride    ? sodium chloride 10 mL/hr at 12/12/21 0700  ? sodium chloride    ? amiodarone 30 mg/hr (12/12/21 0700)  ? ?PRN Meds: ?sodium chloride, sodium chloride, sodium chloride, acetaminophen, etomidate, hydrALAZINE, nitroGLYCERIN, ondansetron (ZOFRAN) IV, prochlorperazine, sodium chloride flush, sodium chloride flush  ? ?Vital Signs  ? ?Vitals:  ? 12/12/21 0400 12/12/21 0500 12/12/21 0600 12/12/21 0700  ?BP: 105/79 115/79 105/75 121/81  ?Pulse: 77 72 71 76  ?Resp: '15 15 13 20  '$ ?Temp:      ?TempSrc:      ?SpO2: 96% 95% 97% 94%  ?Weight:  68.9 kg    ?Height:      ? ? ?Intake/Output Summary (Last 24 hours) at 12/12/2021 0730 ?Last data filed at 12/12/2021 0700 ?Gross per 24 hour  ?Intake 1478.14 ml  ?Output 450 ml  ?Net 1028.14 ml  ? ? ?  12/12/2021  ?  5:00 AM 12/11/2021  ?  5:55 PM 12/11/2021  ?   3:00 PM  ?Last 3 Weights  ?Weight (lbs) 151 lb 14.4 oz 151 lb 14.4 oz 151 lb 14.4 oz  ?Weight (kg) 68.9 kg 68.9 kg 68.9 kg  ?   ? ?Telemetry  ?Overnight telemetry shows SR 70s, which I personally reviewed.  ? ?Physical Exam  ? ?Vitals:  ? 12/12/21 0400 12/12/21 0500 12/12/21 0600 12/12/21 0700  ?BP: 105/79 115/79 105/75 121/81  ?Pulse: 77 72 71 76  ?Resp: '15 15 13 20  '$ ?Temp:      ?TempSrc:      ?SpO2: 96% 95% 97% 94%  ?Weight:  68.9 kg    ?Height:      ?  ?Intake/Output Summary (Last 24 hours) at 12/12/2021 0730 ?Last data filed at 12/12/2021 0700 ?Gross per 24 hour  ?Intake 1478.14 ml  ?Output 450 ml  ?Net 1028.14 ml  ?  ? ?  12/12/2021  ?  5:00 AM 12/11/2021  ?  5:55 PM 12/11/2021  ?  3:00 PM  ?Last 3 Weights  ?Weight (lbs) 151 lb 14.4 oz 151 lb 14.4 oz 151 lb 14.4 oz  ?Weight (kg) 68.9 kg 68.9 kg 68.9 kg  ?  Body mass index is 23.79 kg/m?.  ?General: Well nourished, well developed, in no acute distress ?  Head: Atraumatic, normal size  ?Eyes: PEERLA, EOMI  ?Neck: Supple, JVD 7-8 cmH2O ?Endocrine: No thryomegaly ?Cardiac: Normal S1, S2; RRR; no murmurs, rubs, or gallops ?Lungs: Clear to auscultation bilaterally, no wheezing, rhonchi or rales  ?Abd: Soft, nontender, no hepatomegaly  ?Ext: No edema, pulses 2+ ?Musculoskeletal: No deformities, BUE and BLE strength normal and equal ?Skin: Warm and dry, no rashes   ?Neuro: Alert and oriented to person, place, time, and situation, CNII-XII grossly intact, no focal deficits  ?Psych: Normal mood and affect  ? ?Labs  ?High Sensitivity Troponin:   ?Recent Labs  ?Lab 12/11/21 ?1347 12/11/21 ?1552  ?TROPONINIHS 196* 265*  ?   ?Cardiac EnzymesNo results for input(s): TROPONINI in the last 168 hours. No results for input(s): TROPIPOC in the last 168 hours.  ?Chemistry ?Recent Labs  ?Lab 12/11/21 ?1347 12/12/21 ?0104  ?NA 139 138  ?K 4.4 4.3  ?CL 110 111  ?CO2 14* 21*  ?GLUCOSE 346* 179*  ?BUN 21* 23*  ?CREATININE 1.75* 1.10  ?CALCIUM 8.3* 8.9  ?PROT 5.8*  --   ?ALBUMIN 3.4*  --    ?AST 43*  --   ?ALT 31  --   ?ALKPHOS 42  --   ?BILITOT 1.4*  --   ?GFRNONAA 45* >60  ?ANIONGAP 15 6  ?  ?Hematology ?Recent Labs  ?Lab 12/11/21 ?1347 12/12/21 ?0104  ?WBC 21.9* 16.3*  ?RBC 4.10* 4.07*  ?HGB 12.9* 12.5*  ?HCT 39.7 36.8*  ?MCV 96.8 90.4  ?MCH 31.5 30.7  ?MCHC 32.5 34.0  ?RDW 13.2 13.0  ?PLT 149* 168  ? ?BNPNo results for input(s): BNP, PROBNP in the last 168 hours.  ?DDimer No results for input(s): DDIMER in the last 168 hours.  ? ?Radiology  ?DG Chest Portable 1 View ? ?Result Date: 12/11/2021 ?CLINICAL DATA:  Shortness of breath EXAM: PORTABLE CHEST 1 VIEW COMPARISON:  02/25/2021 FINDINGS: Cardiomegaly. Defibrillator leads applied. Both lungs are clear. The visualized skeletal structures are unremarkable. IMPRESSION: Cardiomegaly without acute abnormality of the lungs in AP portable projection. Electronically Signed   By: Delanna Ahmadi M.D.   On: 12/11/2021 13:29  ? ?ECHOCARDIOGRAM COMPLETE ? ?Result Date: 12/11/2021 ?   ECHOCARDIOGRAM REPORT   Patient Name:   Andrew Huerta [facility] Date of Exam: 12/11/2021 Medical Rec #:  [id number]       Height:       67.0 in Accession #:    [id number]      Weight:       152.0 lb Date of Birth:  1966-05-24       BSA:          1.800 m? Patient Age:    56 years        BP:           123/81 mmHg Patient Gender: M               HR:           68 bpm. Exam Location:  Inpatient Procedure: 2D Echo, Cardiac Doppler, Color Doppler and Intracardiac            Opacification Agent Indications:    V tach  History:        Patient has prior history of Echocardiogram examinations. CAD;                 Risk Factors:Hypertension and Diabetes.  Sonographer:    Jyl Heinz Referring Phys: [id number] [place]  1. Left ventricular ejection fraction, by estimation,  is 20 to 25%. The left ventricle has severely decreased function. The left ventricle has no regional wall motion abnormalities. Left ventricular diastolic parameters are indeterminate. There is akinesis of the left  ventricular, basal-mid inferoseptal wall and anteroseptal wall. There is akinesis of the left ventricular, apical septal wall. There is akinesis of the left ventricular, mid-apical inferior wall and anterior wall. There is hypokinesis of the left ventricular, entire lateral wall and inferolateral wall.  2. Right ventricular systolic function is mildly reduced. The right ventricular size is mildly enlarged. The estimated right ventricular systolic pressure is 97.3 mmHg.  3. Right atrial size was moderately dilated.  4. The mitral valve is normal in structure. Trivial mitral valve regurgitation. No evidence of mitral stenosis.  5. The aortic valve is normal in structure. Aortic valve regurgitation is not visualized. Aortic valve sclerosis is present, with no evidence of aortic valve stenosis.  6. The inferior vena cava is dilated in size with <50% respiratory variability, suggesting right atrial pressure of 15 mmHg. FINDINGS  Left Ventricle: Left ventricular ejection fraction, by estimation, is 20 to 25%. The left ventricle has severely decreased function. The left ventricle has no regional wall motion abnormalities. The left ventricular internal cavity size was normal in size. There is no left ventricular hypertrophy. Left ventricular diastolic parameters are indeterminate. Normal left ventricular filling pressure. Right Ventricle: The right ventricular size is mildly enlarged. No increase in right ventricular wall thickness. Right ventricular systolic function is mildly reduced. The tricuspid regurgitant velocity is 2.16 m/s, and with an assumed right atrial pressure of 15 mmHg, the estimated right ventricular systolic pressure is 53.2 mmHg. Left Atrium: Left atrial size was normal in size. Right Atrium: Right atrial size was moderately dilated. Pericardium: There is no evidence of pericardial effusion. Mitral Valve: The mitral valve is normal in structure. Trivial mitral valve regurgitation. No evidence of mitral  valve stenosis. Tricuspid Valve: The tricuspid valve is normal in structure. Tricuspid valve regurgitation is trivial. No evidence of tricuspid stenosis. Aortic Valve: The aortic valve is normal in structure.

## 2021-12-12 NOTE — TOC Benefit Eligibility Note (Signed)
Patient Advocate Encounter ? ?Insurance verification completed.   ? ?The patient is currently admitted and upon discharge could be taking Entresto 24-26 mg. ? ?The current 30 day co-pay is, $604.09  ?100% patient pay applies at retail       ?unless filled at a [place] [facility]          .  ? ?The patient is insured through [employer]  ? ? ? ?Lyndel Safe, CPhT ?Pharmacy Patient Advocate Specialist ?[place] Patient Advocate Team ?Direct Number: [phone]  Fax: [phone] ? ? ? ? ? ?  ?
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
## 2021-12-12 NOTE — Interval H&P Note (Signed)
History and Physical Interval Note: ? ?12/12/2021 ?8:04 AM ? ?Andrew Huerta  has presented today for surgery, with the diagnosis of vt.  The various methods of treatment have been discussed with the patient and family. After consideration of risks, benefits and other options for treatment, the patient has consented to  Procedure(s): ?RIGHT/LEFT HEART CATH AND CORONARY ANGIOGRAPHY (N/A) and possible coronary angioplasty as a surgical intervention.  The patient's history has been reviewed, patient examined, no change in status, stable for surgery.  I have reviewed the patient's chart and labs.  Questions were answered to the patient's satisfaction.   ? ? ?[name] ? ? ?

## 2021-12-12 NOTE — Progress Notes (Signed)
Inpatient Diabetes Program Recommendations ? ?AACE/ADA: New Consensus Statement on Inpatient Glycemic Control  ? ?Target Ranges:  Prepandial:   less than 140 mg/dL ?     Peak postprandial:   less than 180 mg/dL (1-2 hours) ?     Critically ill patients:  140 - 180 mg/dL  ? ? Latest Reference Range & Units 12/11/21 12:53 12/11/21 19:14 12/11/21 21:53 12/12/21 06:36  ?Glucose-Capillary 70 - 99 mg/dL 357 (H) 334 (H) 316 (H) 150 (H)  ? ? Latest Reference Range & Units 12/11/21 20:36  ?Hemoglobin A1C 4.8 - 5.6 % 7.1 (H)  ? ?Review of Glycemic Control ? ?Diabetes history: DM2 ?Outpatient Diabetes medications: Glipizide 5 mg daily ?Current orders for Inpatient glycemic control: Novolog 0-24 units TID with meals, Novolog 0-5 units QHS, Glipizide 5 mg daily ? ? ?NOTE: Noted consult for diabetes coordinator to assist with DM medications. Patient admitted with v-tach, acute systolic heart failure, and initial glucose 357 mg/dl on 12/11/21. Patient received Glipizide on 12/11/21 and fasting glucose 150 mg/dl today. Agree with current orders for glycemic control. ? ?Thanks, ?Barnie Alderman, RN, MSN, CDE ?Diabetes Coordinator ?Inpatient Diabetes Program ?[phone] (Team Pager from 8am to 5pm) ? ? ? ?

## 2021-12-12 NOTE — Progress Notes (Signed)
Called by nurse re: IV issues. Patient has been bending arm a lot and had issues with bleeding at IV site with subsequent leaking/discomfort/itching and is asking if IV needs to be continued. Getting IV amiodarone presently. Per d/w Tommye Standard with EP, OK to transition to oral amiodarone '400mg'$  BID and d/c amiodarone drip 1 hour after first dose. ?
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
## 2021-12-12 NOTE — Progress Notes (Signed)
Heart Failure Stewardship Pharmacist Progress Note ? ? ?PCP: Andrew Del, DO ?PCP-Cardiologist: Andrew Martinique, MD  ? ? ?HPI:  ?56 yo M with PMH of CAD s/p PCI, HTN, ICM, syncope, HLD, T2DM, Barrett's esophagus, adrenal tumor s/p adrenal gland resection. He presented to the ED on  4/12 complaining of rapid heart rate found to be in VT. S/p cardioversion in ED. CXR with cardiomegaly. An ECHO was done 4/12 and LVEF 20-25% (was 40-45% in July 2021; 35-40% in May 2020) with mildly reduced RV. R/LHC on 4/13 with significant progression of 3v CAD needing PCI/atherectomy in LAD and well-compensated hemodynamics. LVEF estimated to be 30-35% on cath. Scheduled for ICD implant on 4/14. ? ?Current HF Medications: ?Other: hydralazine 25 mg TID; Imdur 30 mg daily ? ?Prior to admission HF Medications: ?None ? ?Pertinent Lab Values: ?Serum creatinine 1.10, BUN 23, Potassium 4.3, Sodium 138, Magnesium 1.8, A1c 7.1  ? ?Vital Signs: ?Weight: 151 lbs (admission weight: 151 lbs) ?Blood pressure: 110/70s  ?Heart rate: 60s  ?I/O: not well documented ? ?Medication Assistance / Insurance Benefits Check: ?Does the patient have prescription insurance?  Yes ?Type of insurance plan: [employer] commercial insurance ? ?Outpatient Pharmacy:  ?Prior to admission outpatient pharmacy: Andrew Huerta ?Is the patient willing to use [place] pharmacy at discharge? Yes ?Is the patient willing to transition their outpatient pharmacy to utilize a [facility] outpatient pharmacy?   Pending ?  ? ?Assessment: ?1. Acute systolic CHF (EF 14-43%), due to ICM. ICD implant tomorrow. Elective PCI/atherectomy anticipated as outpatient. NYHA class II symptoms. ?- Mildly volume up on RHC - wedge 14. No diuretics. May need PRN dosing on discharge ?- No BB with chronic hypotension ?- Continue hydralazine 25 mg TID + Imdur 30 mg daily ?- Plan to transition hydral/nitrate to ARB + spironolactone + SGLT2i ?  ?Plan: ?1) Medication changes recommended at this time: ?- Agree  with plan to transition to ARB + spironolactone  ? ?2) Patient assistance: ?- Patient required to fill at [facility], not contracted with [facility] ?- Patient will pay 100% of cost if fills at other pharmacy ? ?3)  Education  ?- To be completed prior to discharge ? ?Andrew Huerta, PharmD, BCPS ?Heart Failure Stewardship Pharmacist ?Phone [phone] ? ? ?

## 2021-12-12 NOTE — H&P (View-Only) (Signed)
?Cardiology Progress Note  ?Patient ID: Andrew Huerta ?MRN: [id number] ?DOB: 03/23/1966 ?Date of Encounter: 12/12/2021 ? ?Primary Cardiologist: Peter Martinique, MD ? ?Subjective  ? ?Chief Complaint: Fatigue ? ?HPI: No further ventricular tachycardia.  Quite fatigued this morning.  Denies any infectious symptoms.  Symptoms slowly improving. ? ?ROS:  ?All other ROS reviewed and negative. Pertinent positives noted in the HPI.    ? ?Inpatient Medications  ?Scheduled Meds: ? [START ON 12/13/2021] aspirin EC  81 mg Oral Daily  ? atorvastatin  80 mg Oral Daily  ? Chlorhexidine Gluconate Cloth  6 each Topical Daily  ? clopidogrel  75 mg Oral Daily  ? glipiZIDE  5 mg Oral Daily  ? hydrALAZINE  25 mg Oral Q8H  ? insulin aspart  0-24 Units Subcutaneous TID WC  ? insulin aspart  0-5 Units Subcutaneous QHS  ? isosorbide mononitrate  30 mg Oral Daily  ? pantoprazole  40 mg Oral Daily  ? pneumococcal 20-valent conjugate vaccine  0.5 mL Intramuscular Tomorrow-1000  ? sertraline  200 mg Oral Daily  ? sodium chloride flush  3 mL Intravenous Q12H  ? sodium chloride flush  3 mL Intravenous Q12H  ? ?Continuous Infusions: ? sodium chloride    ? sodium chloride    ? sodium chloride 10 mL/hr at 12/12/21 0700  ? sodium chloride    ? amiodarone 30 mg/hr (12/12/21 0700)  ? ?PRN Meds: ?sodium chloride, sodium chloride, sodium chloride, acetaminophen, etomidate, hydrALAZINE, nitroGLYCERIN, ondansetron (ZOFRAN) IV, prochlorperazine, sodium chloride flush, sodium chloride flush  ? ?Vital Signs  ? ?Vitals:  ? 12/12/21 0400 12/12/21 0500 12/12/21 0600 12/12/21 0700  ?BP: 105/79 115/79 105/75 121/81  ?Pulse: 77 72 71 76  ?Resp: '15 15 13 20  '$ ?Temp:      ?TempSrc:      ?SpO2: 96% 95% 97% 94%  ?Weight:  68.9 kg    ?Height:      ? ? ?Intake/Output Summary (Last 24 hours) at 12/12/2021 0730 ?Last data filed at 12/12/2021 0700 ?Gross per 24 hour  ?Intake 1478.14 ml  ?Output 450 ml  ?Net 1028.14 ml  ? ? ?  12/12/2021  ?  5:00 AM 12/11/2021  ?  5:55 PM 12/11/2021  ?   3:00 PM  ?Last 3 Weights  ?Weight (lbs) 151 lb 14.4 oz 151 lb 14.4 oz 151 lb 14.4 oz  ?Weight (kg) 68.9 kg 68.9 kg 68.9 kg  ?   ? ?Telemetry  ?Overnight telemetry shows SR 70s, which I personally reviewed.  ? ?Physical Exam  ? ?Vitals:  ? 12/12/21 0400 12/12/21 0500 12/12/21 0600 12/12/21 0700  ?BP: 105/79 115/79 105/75 121/81  ?Pulse: 77 72 71 76  ?Resp: '15 15 13 20  '$ ?Temp:      ?TempSrc:      ?SpO2: 96% 95% 97% 94%  ?Weight:  68.9 kg    ?Height:      ?  ?Intake/Output Summary (Last 24 hours) at 12/12/2021 0730 ?Last data filed at 12/12/2021 0700 ?Gross per 24 hour  ?Intake 1478.14 ml  ?Output 450 ml  ?Net 1028.14 ml  ?  ? ?  12/12/2021  ?  5:00 AM 12/11/2021  ?  5:55 PM 12/11/2021  ?  3:00 PM  ?Last 3 Weights  ?Weight (lbs) 151 lb 14.4 oz 151 lb 14.4 oz 151 lb 14.4 oz  ?Weight (kg) 68.9 kg 68.9 kg 68.9 kg  ?  Body mass index is 23.79 kg/m?.  ?General: Well nourished, well developed, in no acute distress ?  Head: Atraumatic, normal size  ?Eyes: PEERLA, EOMI  ?Neck: Supple, JVD 7-8 cmH2O ?Endocrine: No thryomegaly ?Cardiac: Normal S1, S2; RRR; no murmurs, rubs, or gallops ?Lungs: Clear to auscultation bilaterally, no wheezing, rhonchi or rales  ?Abd: Soft, nontender, no hepatomegaly  ?Ext: No edema, pulses 2+ ?Musculoskeletal: No deformities, BUE and BLE strength normal and equal ?Skin: Warm and dry, no rashes   ?Neuro: Alert and oriented to person, place, time, and situation, CNII-XII grossly intact, no focal deficits  ?Psych: Normal mood and affect  ? ?Labs  ?High Sensitivity Troponin:   ?Recent Labs  ?Lab 12/11/21 ?1347 12/11/21 ?1552  ?TROPONINIHS 196* 265*  ?   ?Cardiac EnzymesNo results for input(s): TROPONINI in the last 168 hours. No results for input(s): TROPIPOC in the last 168 hours.  ?Chemistry ?Recent Labs  ?Lab 12/11/21 ?1347 12/12/21 ?0104  ?NA 139 138  ?K 4.4 4.3  ?CL 110 111  ?CO2 14* 21*  ?GLUCOSE 346* 179*  ?BUN 21* 23*  ?CREATININE 1.75* 1.10  ?CALCIUM 8.3* 8.9  ?PROT 5.8*  --   ?ALBUMIN 3.4*  --    ?AST 43*  --   ?ALT 31  --   ?ALKPHOS 42  --   ?BILITOT 1.4*  --   ?GFRNONAA 45* >60  ?ANIONGAP 15 6  ?  ?Hematology ?Recent Labs  ?Lab 12/11/21 ?1347 12/12/21 ?0104  ?WBC 21.9* 16.3*  ?RBC 4.10* 4.07*  ?HGB 12.9* 12.5*  ?HCT 39.7 36.8*  ?MCV 96.8 90.4  ?MCH 31.5 30.7  ?MCHC 32.5 34.0  ?RDW 13.2 13.0  ?PLT 149* 168  ? ?BNPNo results for input(s): BNP, PROBNP in the last 168 hours.  ?DDimer No results for input(s): DDIMER in the last 168 hours.  ? ?Radiology  ?DG Chest Portable 1 View ? ?Result Date: 12/11/2021 ?CLINICAL DATA:  Shortness of breath EXAM: PORTABLE CHEST 1 VIEW COMPARISON:  02/25/2021 FINDINGS: Cardiomegaly. Defibrillator leads applied. Both lungs are clear. The visualized skeletal structures are unremarkable. IMPRESSION: Cardiomegaly without acute abnormality of the lungs in AP portable projection. Electronically Signed   By: Delanna Ahmadi M.D.   On: 12/11/2021 13:29  ? ?ECHOCARDIOGRAM COMPLETE ? ?Result Date: 12/11/2021 ?   ECHOCARDIOGRAM REPORT   Patient Name:   Andrew Huerta [facility] Date of Exam: 12/11/2021 Medical Rec #:  [id number]       Height:       67.0 in Accession #:    [id number]      Weight:       152.0 lb Date of Birth:  March 06, 1966       BSA:          1.800 m? Patient Age:    56 years        BP:           123/81 mmHg Patient Gender: M               HR:           68 bpm. Exam Location:  Inpatient Procedure: 2D Echo, Cardiac Doppler, Color Doppler and Intracardiac            Opacification Agent Indications:    V tach  History:        Patient has prior history of Echocardiogram examinations. CAD;                 Risk Factors:Hypertension and Diabetes.  Sonographer:    Jyl Heinz Referring Phys: [id number] [place]  1. Left ventricular ejection fraction, by estimation,  is 20 to 25%. The left ventricle has severely decreased function. The left ventricle has no regional wall motion abnormalities. Left ventricular diastolic parameters are indeterminate. There is akinesis of the left  ventricular, basal-mid inferoseptal wall and anteroseptal wall. There is akinesis of the left ventricular, apical septal wall. There is akinesis of the left ventricular, mid-apical inferior wall and anterior wall. There is hypokinesis of the left ventricular, entire lateral wall and inferolateral wall.  2. Right ventricular systolic function is mildly reduced. The right ventricular size is mildly enlarged. The estimated right ventricular systolic pressure is 63.8 mmHg.  3. Right atrial size was moderately dilated.  4. The mitral valve is normal in structure. Trivial mitral valve regurgitation. No evidence of mitral stenosis.  5. The aortic valve is normal in structure. Aortic valve regurgitation is not visualized. Aortic valve sclerosis is present, with no evidence of aortic valve stenosis.  6. The inferior vena cava is dilated in size with <50% respiratory variability, suggesting right atrial pressure of 15 mmHg. FINDINGS  Left Ventricle: Left ventricular ejection fraction, by estimation, is 20 to 25%. The left ventricle has severely decreased function. The left ventricle has no regional wall motion abnormalities. The left ventricular internal cavity size was normal in size. There is no left ventricular hypertrophy. Left ventricular diastolic parameters are indeterminate. Normal left ventricular filling pressure. Right Ventricle: The right ventricular size is mildly enlarged. No increase in right ventricular wall thickness. Right ventricular systolic function is mildly reduced. The tricuspid regurgitant velocity is 2.16 m/s, and with an assumed right atrial pressure of 15 mmHg, the estimated right ventricular systolic pressure is 75.6 mmHg. Left Atrium: Left atrial size was normal in size. Right Atrium: Right atrial size was moderately dilated. Pericardium: There is no evidence of pericardial effusion. Mitral Valve: The mitral valve is normal in structure. Trivial mitral valve regurgitation. No evidence of mitral  valve stenosis. Tricuspid Valve: The tricuspid valve is normal in structure. Tricuspid valve regurgitation is trivial. No evidence of tricuspid stenosis. Aortic Valve: The aortic valve is normal in structure.

## 2021-12-13 ENCOUNTER — Encounter (HOSPITAL_COMMUNITY)
Admission: EM | Disposition: A | Discharge status: Home / Self Care | Payer: Self-pay | Source: Home / Self Care | Attending: Cardiovascular Disease

## 2021-12-13 DIAGNOSIS — I5021 Acute systolic (congestive) heart failure: Secondary | ICD-10-CM | POA: Diagnosis not present

## 2021-12-13 DIAGNOSIS — I472 Ventricular tachycardia, unspecified: Secondary | ICD-10-CM | POA: Diagnosis not present

## 2021-12-13 DIAGNOSIS — I5023 Acute on chronic systolic (congestive) heart failure: Secondary | ICD-10-CM

## 2021-12-13 DIAGNOSIS — R570 Cardiogenic shock: Secondary | ICD-10-CM | POA: Diagnosis not present

## 2021-12-13 DIAGNOSIS — I25118 Atherosclerotic heart disease of native coronary artery with other forms of angina pectoris: Secondary | ICD-10-CM

## 2021-12-13 HISTORY — PX: ICD IMPLANT: EP1208

## 2021-12-13 LAB — URINALYSIS, COMPLETE (UACMP) WITH MICROSCOPIC
Bilirubin Urine: NEGATIVE
Glucose, UA: NEGATIVE mg/dL
Hgb urine dipstick: NEGATIVE
Ketones, ur: 15 mg/dL — AB
Leukocytes,Ua: NEGATIVE
Nitrite: NEGATIVE
Protein, ur: NEGATIVE mg/dL
Specific Gravity, Urine: 1.01 (ref 1.005–1.030)
pH: 7 (ref 5.0–8.0)

## 2021-12-13 LAB — CBC
HCT: 33.3 % — ABNORMAL LOW (ref 39.0–52.0)
Hemoglobin: 11.8 g/dL — ABNORMAL LOW (ref 13.0–17.0)
MCH: 31.7 pg (ref 26.0–34.0)
MCHC: 35.4 g/dL (ref 30.0–36.0)
MCV: 89.5 fL (ref 80.0–100.0)
Platelets: 127 10*3/uL — ABNORMAL LOW (ref 150–400)
RBC: 3.72 MIL/uL — ABNORMAL LOW (ref 4.22–5.81)
RDW: 13.1 % (ref 11.5–15.5)
WBC: 13.3 10*3/uL — ABNORMAL HIGH (ref 4.0–10.5)
nRBC: 0 % (ref 0.0–0.2)

## 2021-12-13 LAB — RAPID URINE DRUG SCREEN, HOSP PERFORMED
Amphetamines: NOT DETECTED
Barbiturates: NOT DETECTED
Benzodiazepines: NOT DETECTED
Cocaine: NOT DETECTED
Opiates: NOT DETECTED
Tetrahydrocannabinol: POSITIVE — AB

## 2021-12-13 LAB — BASIC METABOLIC PANEL
Anion gap: 5 (ref 5–15)
BUN: 17 mg/dL (ref 6–20)
CO2: 23 mmol/L (ref 22–32)
Calcium: 8.4 mg/dL — ABNORMAL LOW (ref 8.9–10.3)
Chloride: 107 mmol/L (ref 98–111)
Creatinine, Ser: 0.89 mg/dL (ref 0.61–1.24)
GFR, Estimated: >60 mL/min (ref >60)
Glucose, Bld: 100 mg/dL — ABNORMAL HIGH (ref 70–99)
Potassium: 3.4 mmol/L — ABNORMAL LOW (ref 3.5–5.1)
Sodium: 135 mmol/L (ref 135–145)

## 2021-12-13 LAB — GLUCOSE, CAPILLARY
Glucose-Capillary: 103 mg/dL — ABNORMAL HIGH (ref 70–99)
Glucose-Capillary: 111 mg/dL — ABNORMAL HIGH (ref 70–99)
Glucose-Capillary: 89 mg/dL (ref 70–99)
Glucose-Capillary: 147 mg/dL — ABNORMAL HIGH (ref 70–99)

## 2021-12-13 SURGERY — ICD IMPLANT

## 2021-12-13 MED ORDER — SODIUM CHLORIDE 0.9 % IV SOLN
INTRAVENOUS | Status: AC
  Filled 2021-12-13: qty 2

## 2021-12-13 MED ORDER — CHLORHEXIDINE GLUCONATE 4 % EX LIQD
60.0000 mL | Freq: Once | CUTANEOUS | Status: AC
  Administered 2021-12-13: 4 via TOPICAL
  Filled 2021-12-13: qty 60

## 2021-12-13 MED ORDER — FENTANYL CITRATE (PF) 100 MCG/2ML IJ SOLN
INTRAMUSCULAR | Status: DC | PRN
  Administered 2021-12-13 (×2): 25 ug via INTRAVENOUS

## 2021-12-13 MED ORDER — CARVEDILOL 3.125 MG PO TABS
3.1250 mg | ORAL_TABLET | Freq: Two times a day (BID) | ORAL | Status: DC
  Administered 2021-12-13 – 2021-12-14 (×3): 3.125 mg via ORAL
  Filled 2021-12-13 (×3): qty 1

## 2021-12-13 MED ORDER — HYDROCODONE-ACETAMINOPHEN 5-325 MG PO TABS
1.0000 | ORAL_TABLET | Freq: Three times a day (TID) | ORAL | Status: DC | PRN
  Administered 2021-12-13 – 2021-12-14 (×3): 1 via ORAL
  Filled 2021-12-13: qty 2
  Filled 2021-12-13 (×3): qty 1

## 2021-12-13 MED ORDER — HEPARIN (PORCINE) IN NACL 1000-0.9 UT/500ML-% IV SOLN
INTRAVENOUS | Status: DC | PRN
Start: 2021-12-13 — End: 2021-12-13
  Administered 2021-12-13: 500 mL

## 2021-12-13 MED ORDER — CEFAZOLIN SODIUM-DEXTROSE 2-4 GM/100ML-% IV SOLN
INTRAVENOUS | Status: AC
  Filled 2021-12-13: qty 100

## 2021-12-13 MED ORDER — MIDAZOLAM HCL 5 MG/5ML IJ SOLN
INTRAMUSCULAR | Status: DC | PRN
  Administered 2021-12-13 (×2): 1 mg via INTRAVENOUS

## 2021-12-13 MED ORDER — LIDOCAINE HCL (PF) 1 % IJ SOLN
INTRAMUSCULAR | Status: DC | PRN
Start: 2021-12-13 — End: 2021-12-13
  Administered 2021-12-13: 60 mL

## 2021-12-13 MED ORDER — LIDOCAINE HCL 1 % IJ SOLN
INTRAMUSCULAR | Status: AC
  Filled 2021-12-13: qty 60

## 2021-12-13 MED ORDER — MIDAZOLAM HCL 5 MG/5ML IJ SOLN
INTRAMUSCULAR | Status: AC
  Filled 2021-12-13: qty 5

## 2021-12-13 MED ORDER — DAPAGLIFLOZIN PROPANEDIOL 10 MG PO TABS
10.0000 mg | ORAL_TABLET | Freq: Every day | ORAL | Status: DC
  Administered 2021-12-13 – 2021-12-14 (×2): 10 mg via ORAL
  Filled 2021-12-13 (×2): qty 1

## 2021-12-13 MED ORDER — LOSARTAN POTASSIUM 25 MG PO TABS
12.5000 mg | ORAL_TABLET | Freq: Every day | ORAL | Status: DC
  Administered 2021-12-13 – 2021-12-14 (×2): 12.5 mg via ORAL
  Filled 2021-12-13 (×2): qty 1

## 2021-12-13 MED ORDER — SODIUM CHLORIDE 0.9 % IV SOLN: 250.0000 mL | INTRAVENOUS | Status: DC

## 2021-12-13 MED ORDER — SODIUM CHLORIDE 0.9 % IV SOLN: INTRAVENOUS | Status: DC

## 2021-12-13 MED ORDER — HEPARIN (PORCINE) IN NACL 1000-0.9 UT/500ML-% IV SOLN
INTRAVENOUS | Status: AC
  Filled 2021-12-13: qty 500

## 2021-12-13 MED ORDER — CEFAZOLIN SODIUM-DEXTROSE 2-4 GM/100ML-% IV SOLN
2.0000 g | INTRAVENOUS | Status: AC
  Administered 2021-12-13: 2 g via INTRAVENOUS

## 2021-12-13 MED ORDER — FENTANYL CITRATE (PF) 100 MCG/2ML IJ SOLN
INTRAMUSCULAR | Status: AC
  Filled 2021-12-13: qty 2

## 2021-12-13 MED ORDER — SODIUM CHLORIDE 0.9 % IV SOLN
80.0000 mg | INTRAVENOUS | Status: AC
  Administered 2021-12-13: 80 mg
  Filled 2021-12-13: qty 2

## 2021-12-13 MED ORDER — POTASSIUM CHLORIDE CRYS ER 20 MEQ PO TBCR
40.0000 meq | EXTENDED_RELEASE_TABLET | Freq: Once | ORAL | Status: AC
Start: 2021-12-13 — End: 2021-12-13
  Administered 2021-12-13: 40 meq via ORAL
  Filled 2021-12-13: qty 2

## 2021-12-13 SURGICAL SUPPLY — 8 items
CABLE SURGICAL S-101-97-12 (CABLE) ×2 IMPLANT
ICD VIGILANT VR D232 (Pacemaker) ×1 IMPLANT
LEAD RELIANCE 0672 ×1 IMPLANT
PAD DEFIB RADIO PHYSIO CONN (PAD) ×2 IMPLANT
POUCH AIGIS-R ANTIBACT ICD (Mesh General) ×2 IMPLANT
POUCH AIGIS-R ANTIBACT ICD LRG (Mesh General) IMPLANT
SHEATH 8FR PRELUDE SNAP 13 (SHEATH) ×1 IMPLANT
TRAY PACEMAKER INSERTION (PACKS) ×2 IMPLANT

## 2021-12-13 NOTE — Progress Notes (Signed)
?  Progress Note  ? ?Date: 12/13/2021 ? ?Patient Name: Andrew Huerta        ?MRN#: [id number] ? ?Review the patient?s clinical findings supports the diagnosis of:  ? ?Hyperglycemia  ? ?[name] T. Audie Box, MD, [facility] ?[address]  ?[address], [address] ?[place], [place] [address] ?[phone]  ?12:27 PM ? ? ? ? ? ?

## 2021-12-13 NOTE — Discharge Instructions (Signed)
? ? ?  Supplemental Discharge Instructions for  ?Pacemaker/Defibrillator Patients ? ? ?Activity ?No heavy lifting or vigorous activity with your left/right arm for 6 to 8 weeks.  Do not raise your left/right arm above your head for one week.  Gradually raise your affected arm as drawn below. ? ?        ?   12/18/21                     12/19/21                  12/20/21                   12/21/21 ?__ ? ?NO DRIVING for 6 months. ? ?WOUND CARE ?Keep the wound area clean and dry.  Do not get this area wet , no showersuntil cleared to at your wound check visit ?The tape/steri-strips on your wound will fall off; do not pull them off.  No bandage is needed on the site.  DO  NOT apply any creams, oils, or ointments to the wound area. ?If you notice any drainage or discharge from the wound, any swelling or bruising at the site, or you develop a fever > 101? F after you are discharged home, call the office at once. ? ?Special Instructions ?You are still able to use cellular telephones; use the ear opposite the side where you have your pacemaker/defibrillator.  Avoid carrying your cellular phone near your device. ?When traveling through airports, show security personnel your identification card to avoid being screened in the metal detectors.  Ask the security personnel to use the hand wand. ?Avoid arc welding equipment, MRI testing (magnetic resonance imaging), TENS units (transcutaneous nerve stimulators).  Call the office for questions about other devices. ?Avoid electrical appliances that are in poor condition or are not properly grounded. ?Microwave ovens are safe to be near or to operate. ? ?Additional information for defibrillator patients should your device go off: ?If your device goes off ONCE and you feel fine afterward, notify the device clinic nurses. ?If your device goes off ONCE and you do not feel well afterward, call 911. ?If your device goes off TWICE, call 911. ?If your device goes off THREE times in one day, call  911. ? ?DO NOT DRIVE YOURSELF OR A FAMILY MEMBER ?WITH A DEFIBRILLATOR TO THE HOSPITAL--CALL 911. ? ?

## 2021-12-13 NOTE — Progress Notes (Signed)
?  Progress Note  ? ?Date: 12/13/2021 ? ?Patient Name: Andrew Huerta        ?MRN#: [id number] ? ?Review the patient?s clinical findings supports the diagnosis of:  ? ?Lactic Acidosis secondary to cardiogenic shock.  Resolved. ? ?[name] T. Audie Box, MD, [facility] ?[place]  ?[address] ?[place], [place] [address] ?[phone]  ?12:27 PM ? ? ? ? ? ?

## 2021-12-13 NOTE — Progress Notes (Signed)
? ?Progress Note ? ?Patient Name: Andrew Huerta ?Date of Encounter: 12/13/2021 ? ?[facility] Cardiologist: Peter Martinique, MD  ? ?Subjective  ? ?No complaints ? ?Inpatient Medications  ?  ?Scheduled Meds: ? amiodarone  400 mg Oral BID  ? atorvastatin  80 mg Oral Daily  ? chlorhexidine  60 mL Topical Once  ? Chlorhexidine Gluconate Cloth  6 each Topical Daily  ? gentamicin irrigation  80 mg Irrigation On Call  ? glipiZIDE  5 mg Oral Daily  ? hydrALAZINE  25 mg Oral Q8H  ? insulin aspart  0-24 Units Subcutaneous TID WC  ? insulin aspart  0-5 Units Subcutaneous QHS  ? isosorbide mononitrate  30 mg Oral Daily  ? pantoprazole  40 mg Oral Daily  ? sertraline  200 mg Oral Daily  ? sodium chloride flush  3 mL Intravenous Q12H  ? sodium chloride flush  3 mL Intravenous Q12H  ? ?Continuous Infusions: ? sodium chloride    ? sodium chloride    ? sodium chloride    ?  ceFAZolin (ANCEF) IV    ? ?PRN Meds: ?sodium chloride, acetaminophen, etomidate, hydrALAZINE, nitroGLYCERIN, ondansetron (ZOFRAN) IV, prochlorperazine, sodium chloride flush, sodium chloride flush  ? ?Vital Signs  ?  ?Vitals:  ? 12/13/21 0400 12/13/21 0500 12/13/21 0600 12/13/21 0700  ?BP: 98/63 128/84 124/79 133/79  ?Pulse: 62 64 68 76  ?Resp:      ?Temp:    97.9 ?F (36.6 ?C)  ?TempSrc:    Oral  ?SpO2: 96% 97% 96% 97%  ?Weight:   65.6 kg   ?Height:      ? ? ?Intake/Output Summary (Last 24 hours) at 12/13/2021 0805 ?Last data filed at 12/13/2021 0017 ?Gross per 24 hour  ?Intake 1186.13 ml  ?Output 575 ml  ?Net 611.13 ml  ? ? ?  12/13/2021  ?  6:00 AM 12/12/2021  ?  5:00 AM 12/11/2021  ?  5:55 PM  ?Last 3 Weights  ?Weight (lbs) 144 lb 10 oz 151 lb 14.4 oz 151 lb 14.4 oz  ?Weight (kg) 65.6 kg 68.9 kg 68.9 kg  ?   ? ?Telemetry  ?  ?SR, infrequent PVCs, rare couplet - Personally Reviewed ? ?ECG  ?  ?SR 74, inf/;at T inv- Personally Reviewed ? ?Physical Exam  ? ?GEN: No acute distress.   ?Neck: No JVD ?Cardiac: RRR, no murmurs, rubs, or gallops.  ?Respiratory: CTA  b/l. ?GI: Soft, nontender, non-distended  ?MS: No edema; No deformity. ?Neuro:  Nonfocal  ?Psych: Normal affect  ? ?Labs  ?  ?High Sensitivity Troponin:   ?Recent Labs  ?Lab 12/11/21 ?1347 12/11/21 ?1552  ?TROPONINIHS 196* 265*  ?   ?Chemistry ?Recent Labs  ?Lab 12/11/21 ?1347 12/12/21 ?0104 12/12/21 ?6295 12/12/21 ?0840 12/13/21 ?0036  ?NA 139 138 141 139  141 135  ?K 4.4 4.3 3.8 4.0  3.8 3.4*  ?CL 110 111  --   --  107  ?CO2 14* 21*  --   --  23  ?GLUCOSE 346* 179*  --   --  100*  ?BUN 21* 23*  --   --  17  ?CREATININE 1.75* 1.10  --   --  0.89  ?CALCIUM 8.3* 8.9  --   --  8.4*  ?MG 1.8  --   --   --   --   ?PROT 5.8*  --   --   --   --   ?ALBUMIN 3.4*  --   --   --   --   ?  AST 43*  --   --   --   --   ?ALT 31  --   --   --   --   ?ALKPHOS 42  --   --   --   --   ?BILITOT 1.4*  --   --   --   --   ?GFRNONAA 45* >60  --   --  >60  ?ANIONGAP 15 6  --   --  5  ?  ?Lipids  ?Recent Labs  ?Lab 12/12/21 ?0104  ?CHOL 197  ?TRIG 95  ?HDL 48  ?LDLCALC 130*  ?CHOLHDL 4.1  ?  ?Hematology ?Recent Labs  ?Lab 12/11/21 ?1347 12/12/21 ?0104 12/12/21 ?9147 12/12/21 ?0840 12/13/21 ?0036  ?WBC 21.9* 16.3*  --   --  13.3*  ?RBC 4.10* 4.07*  --   --  3.72*  ?HGB 12.9* 12.5* 11.2* 11.6*  11.6* 11.8*  ?HCT 39.7 36.8* 33.0* 34.0*  34.0* 33.3*  ?MCV 96.8 90.4  --   --  89.5  ?MCH 31.5 30.7  --   --  31.7  ?MCHC 32.5 34.0  --   --  35.4  ?RDW 13.2 13.0  --   --  13.1  ?PLT 149* 168  --   --  127*  ? ?Thyroid  ?Recent Labs  ?Lab 12/11/21 ?1613  ?TSH 0.624  ?  ?BNPNo results for input(s): BNP, PROBNP in the last 168 hours.  ?DDimer No results for input(s): DDIMER in the last 168 hours.  ? ?Radiology  ?  ?DG Chest Portable 1 View ?Result Date: 12/11/2021 ?CLINICAL DATA:  Shortness of breath EXAM: PORTABLE CHEST 1 VIEW COMPARISON:  02/25/2021 FINDINGS: Cardiomegaly. Defibrillator leads applied. Both lungs are clear. The visualized skeletal structures are unremarkable. IMPRESSION: Cardiomegaly without acute abnormality of the lungs in AP  portable projection. Electronically Signed   By: Delanna Ahmadi M.D.   On: 12/11/2021 13:29  ? ?Cardiac Studies  ? ?12/12/21:R/LHC ?Mid LAD lesion is 30% stenosed. ?  Mid RCA to Dist RCA lesion is 100% stenosed. ?  Mid RCA lesion is 99% stenosed. ?  Prox RCA lesion is 40% stenosed. ?  Ost LAD to Prox LAD lesion is 90% stenosed. ?  Non-stenotic Prox LAD lesion was previously treated. ?  Non-stenotic Prox LAD to Mid LAD lesion was previously treated. ?  Non-stenotic Ost Cx to Prox Cx lesion was previously treated. ?  The left ventricular ejection fraction is 25-35% by visual estimate. ?  ?Findings: ?  ?Ao = 110/68 (86) ?LV = 115/16 ?RA = 10  ?RV = 30/13 ?PA = 28/15 (21) ?PCW = 14 ?Fick cardiac output/index = 5.2/2.9 ?PVR = 1.3 WU ?FA sat = 97% ?PA sat = 69%, 70% ?PAPi = 1.3 ?  ?Assessment: ?1. 3v CAD with 90% lesion in proximal LAD prior to previous stent otherwise stable CAD with patency of previous LAD & LCx stents ?2. iCM EF 30-35% ?3. Relatively well-compensated hemodynamics with evidence of RV dysfunction ?  ?Plan/Discussion:  ?  ?Suspect VT is scar-mediated and will need ICD. However, he also has significant progression of CAD in proximal LAD and will need PCI/atherectomy of this area. We will coordinate timing of ICD and PCI procedures to minimize risk. ?  ?12/11/21: TTE ?IMPRESSIONS  ? 1. Left ventricular ejection fraction, by estimation, is 20 to 25%. The  ?left ventricle has severely decreased function. The left ventricle has no  ?regional wall motion abnormalities. Left ventricular diastolic parameters  ?are indeterminate. There  is  ?akinesis of the left ventricular, basal-mid inferoseptal wall and  ?anteroseptal wall. There is akinesis of the left ventricular, apical  ?septal wall. There is akinesis of the left ventricular, mid-apical  ?inferior wall and anterior wall. There is  ?hypokinesis of the left ventricular, entire lateral wall and inferolateral  ?wall.  ? 2. Right ventricular systolic function is  mildly reduced. The right  ?ventricular size is mildly enlarged. The estimated right ventricular  ?systolic pressure is 73.4 mmHg.  ? 3. Right atrial size was moderately dilated.  ? 4. The mitral valve is normal in structure. Trivial mitral valve  ?regurgitation. No evidence of mitral stenosis.  ? 5. The aortic valve is normal in structure. Aortic valve regurgitation is  ?not visualized. Aortic valve sclerosis is present, with no evidence of  ?aortic valve stenosis.  ? 6. The inferior vena cava is dilated in size with <50% respiratory  ?variability, suggesting right atrial pressure of 15 mmHg.  ?  ?  ?Cath 01/04/2019 ?Mid RCA lesion is 99% stenosed. ?Mid RCA to Dist RCA lesion is 100% stenosed. ?Ost LAD to Prox LAD lesion is 40% stenosed. ?Previously placed Prox LAD stent (unknown type) is widely patent. ?Prox LAD to Mid LAD lesion is 80% stenosed. ?A stent was successfully placed. ?Post intervention, there is a 0% residual stenosis. ?Post intervention, there is a 0% residual stenosis. ?Mid LAD lesion is 30% stenosed. ?Previously placed Ost Cx to Prox Cx stent (unknown type) is widely patent. ?  ?Multivessel CAD with 40% smooth ostial proximal stenosis of the LAD with a septal perforating artery that arises from the ostium and has a 90% stenosis (not able to be depicted in the diagram), widely patent stent proximally followed by new 80% stenosis the on the stented segment and proximal to the mid diagonal vessels.  There is 30% LAD stenosis beyond the mid diagonal vessels; patent moderate-sized ramus intermediate vessel; patent proximal left circumflex stent; and diffuse 99% to 100% occlusion of the mid distal RCA with evidence for moderate left to right collateralization from the LAD, septal perforating arteries, and LCX. ?  ?LVEDP 17 mmHg. ?  ?Successful PCI to the 80% LAD stenosis with ultimate insertion of a 2.5 x 18 mm Resolute Onyx DES stent postdilated to 2.6 mm with the 80% stenosis being reduced to 0%. ?   ?RECOMMENDATION: ?DAPT for minimum of 1 year and possibly long-term due to the patient's significant CAD history.  Aggressive lipid-lowering therapy with target LDL less than 70.  Medical therapy for RCA occlusion with co

## 2021-12-13 NOTE — Progress Notes (Signed)
?Cardiology Progress Note  ?Patient ID: Andrew Huerta ?MRN: [id number] ?DOB: 12/12/1965 ?Date of Encounter: 12/13/2021 ? ?Primary Cardiologist: Peter Martinique, MD ? ?Subjective  ? ?Chief Complaint: none.  ? ?HPI: No further VT.  On oral amiodarone.  Plan for ICD today.  Does have proximal LAD disease that will need PCI.  Course of action is ICD first. ? ?ROS:  ?All other ROS reviewed and negative. Pertinent positives noted in the HPI.    ? ?Inpatient Medications  ?Scheduled Meds: ? amiodarone  400 mg Oral BID  ? atorvastatin  80 mg Oral Daily  ? chlorhexidine  60 mL Topical Once  ? Chlorhexidine Gluconate Cloth  6 each Topical Daily  ? gentamicin irrigation  80 mg Irrigation On Call  ? glipiZIDE  5 mg Oral Daily  ? hydrALAZINE  25 mg Oral Q8H  ? insulin aspart  0-24 Units Subcutaneous TID WC  ? insulin aspart  0-5 Units Subcutaneous QHS  ? isosorbide mononitrate  30 mg Oral Daily  ? pantoprazole  40 mg Oral Daily  ? sertraline  200 mg Oral Daily  ? sodium chloride flush  3 mL Intravenous Q12H  ? sodium chloride flush  3 mL Intravenous Q12H  ? ?Continuous Infusions: ? sodium chloride    ? sodium chloride    ? sodium chloride    ?  ceFAZolin (ANCEF) IV    ? ?PRN Meds: ?sodium chloride, acetaminophen, etomidate, hydrALAZINE, nitroGLYCERIN, ondansetron (ZOFRAN) IV, prochlorperazine, sodium chloride flush, sodium chloride flush  ? ?Vital Signs  ? ?Vitals:  ? 12/13/21 0400 12/13/21 0500 12/13/21 0600 12/13/21 0700  ?BP: 98/63 128/84 124/79 133/79  ?Pulse: 62 64 68 76  ?Resp:      ?Temp:    97.9 ?F (36.6 ?C)  ?TempSrc:    Oral  ?SpO2: 96% 97% 96% 97%  ?Weight:   65.6 kg   ?Height:      ? ? ?Intake/Output Summary (Last 24 hours) at 12/13/2021 0816 ?Last data filed at 12/13/2021 0017 ?Gross per 24 hour  ?Intake 1186.13 ml  ?Output 575 ml  ?Net 611.13 ml  ? ? ?  12/13/2021  ?  6:00 AM 12/12/2021  ?  5:00 AM 12/11/2021  ?  5:55 PM  ?Last 3 Weights  ?Weight (lbs) 144 lb 10 oz 151 lb 14.4 oz 151 lb 14.4 oz  ?Weight (kg) 65.6 kg 68.9 kg  68.9 kg  ?   ? ?Telemetry  ?Overnight telemetry shows SR 70s, which I personally reviewed.  ? ? ?Physical Exam  ? ?Vitals:  ? 12/13/21 0400 12/13/21 0500 12/13/21 0600 12/13/21 0700  ?BP: 98/63 128/84 124/79 133/79  ?Pulse: 62 64 68 76  ?Resp:      ?Temp:    97.9 ?F (36.6 ?C)  ?TempSrc:    Oral  ?SpO2: 96% 97% 96% 97%  ?Weight:   65.6 kg   ?Height:      ?  ?Intake/Output Summary (Last 24 hours) at 12/13/2021 0816 ?Last data filed at 12/13/2021 0017 ?Gross per 24 hour  ?Intake 1186.13 ml  ?Output 575 ml  ?Net 611.13 ml  ?  ? ?  12/13/2021  ?  6:00 AM 12/12/2021  ?  5:00 AM 12/11/2021  ?  5:55 PM  ?Last 3 Weights  ?Weight (lbs) 144 lb 10 oz 151 lb 14.4 oz 151 lb 14.4 oz  ?Weight (kg) 65.6 kg 68.9 kg 68.9 kg  ?  Body mass index is 22.65 kg/m?.  ?General: Well nourished, well developed, in no  acute distress ?Head: Atraumatic, normal size  ?Eyes: PEERLA, EOMI  ?Neck: Supple, no JVD ?Endocrine: No thryomegaly ?Cardiac: Normal S1, S2; RRR; no murmurs, rubs, or gallops ?Lungs: Clear to auscultation bilaterally, no wheezing, rhonchi or rales  ?Abd: Soft, nontender, no hepatomegaly  ?Ext: No edema, pulses 2+ ?Musculoskeletal: No deformities, BUE and BLE strength normal and equal ?Skin: Warm and dry, no rashes   ?Neuro: Alert and oriented to person, place, time, and situation, CNII-XII grossly intact, no focal deficits  ?Psych: Normal mood and affect  ? ?Labs  ?High Sensitivity Troponin:   ?Recent Labs  ?Lab 12/11/21 ?1347 12/11/21 ?1552  ?TROPONINIHS 196* 265*  ?   ?Cardiac EnzymesNo results for input(s): TROPONINI in the last 168 hours. No results for input(s): TROPIPOC in the last 168 hours.  ?Chemistry ?Recent Labs  ?Lab 12/11/21 ?1347 12/12/21 ?0104 12/12/21 ?6314 12/12/21 ?0840 12/13/21 ?0036  ?NA 139 138 141 139  141 135  ?K 4.4 4.3 3.8 4.0  3.8 3.4*  ?CL 110 111  --   --  107  ?CO2 14* 21*  --   --  23  ?GLUCOSE 346* 179*  --   --  100*  ?BUN 21* 23*  --   --  17  ?CREATININE 1.75* 1.10  --   --  0.89  ?CALCIUM 8.3* 8.9   --   --  8.4*  ?PROT 5.8*  --   --   --   --   ?ALBUMIN 3.4*  --   --   --   --   ?AST 43*  --   --   --   --   ?ALT 31  --   --   --   --   ?ALKPHOS 42  --   --   --   --   ?BILITOT 1.4*  --   --   --   --   ?GFRNONAA 45* >60  --   --  >60  ?ANIONGAP 15 6  --   --  5  ?  ?Hematology ?Recent Labs  ?Lab 12/11/21 ?1347 12/12/21 ?0104 12/12/21 ?9702 12/12/21 ?0840 12/13/21 ?0036  ?WBC 21.9* 16.3*  --   --  13.3*  ?RBC 4.10* 4.07*  --   --  3.72*  ?HGB 12.9* 12.5* 11.2* 11.6*  11.6* 11.8*  ?HCT 39.7 36.8* 33.0* 34.0*  34.0* 33.3*  ?MCV 96.8 90.4  --   --  89.5  ?MCH 31.5 30.7  --   --  31.7  ?MCHC 32.5 34.0  --   --  35.4  ?RDW 13.2 13.0  --   --  13.1  ?PLT 149* 168  --   --  127*  ? ?BNPNo results for input(s): BNP, PROBNP in the last 168 hours.  ?DDimer No results for input(s): DDIMER in the last 168 hours.  ? ?Radiology  ?CARDIAC CATHETERIZATION ? ?Result Date: 12/12/2021 ?  Mid LAD lesion is 30% stenosed.   Mid RCA to Dist RCA lesion is 100% stenosed.   Mid RCA lesion is 99% stenosed.   Prox RCA lesion is 40% stenosed.   Ost LAD to Prox LAD lesion is 90% stenosed.   Non-stenotic Prox LAD lesion was previously treated.   Non-stenotic Prox LAD to Mid LAD lesion was previously treated.   Non-stenotic Ost Cx to Prox Cx lesion was previously treated.   The left ventricular ejection fraction is 25-35% by visual estimate. Findings: Ao = 110/68 (86) LV = 115/16 RA = 10 RV =  30/13 PA = 28/15 (21) PCW = 14 Fick cardiac output/index = 5.2/2.9 PVR = 1.3 WU FA sat = 97% PA sat = 69%, 70% PAPi = 1.3 Assessment: 1. 3v CAD with 90% lesion in proximal LAD prior to previous stent otherwise stable CAD with patency of previous LAD & LCx stents 2. iCM EF 30-35% 3. Relatively well-compensated hemodynamics with evidence of RV dysfunction Plan/Discussion: Suspect VT is scar-mediated and will need ICD. However, he also has significant progression of CAD in proximal LAD and will need PCI/atherectomy of this area. We will coordinate timing  of ICD and PCI procedures to minimize risk. Glori Bickers, MD 9:20 AM  ? ?DG Chest Portable 1 View ? ?Result Date: 12/11/2021 ?CLINICAL DATA:  Shortness of breath EXAM: PORTABLE CHEST 1 VIEW COMPARISON:  02/25/2021 FINDINGS: Cardiomegaly. Defibrillator leads applied. Both lungs are clear. The visualized skeletal structures are unremarkable. IMPRESSION: Cardiomegaly without acute abnormality of the lungs in AP portable projection. Electronically Signed   By: Delanna Ahmadi M.D.   On: 12/11/2021 13:29  ? ?ECHOCARDIOGRAM COMPLETE ? ?Result Date: 12/11/2021 ?   ECHOCARDIOGRAM REPORT   Patient Name:   Andrew Huerta [facility] Date of Exam: 12/11/2021 Medical Rec #:  [id number]       Height:       67.0 in Accession #:    [id number]      Weight:       152.0 lb Date of Birth:  02-04-1966       BSA:          1.800 m? Patient Age:    56 years        BP:           123/81 mmHg Patient Gender: M               HR:           68 bpm. Exam Location:  Inpatient Procedure: 2D Echo, Cardiac Doppler, Color Doppler and Intracardiac            Opacification Agent Indications:    V tach  History:        Patient has prior history of Echocardiogram examinations. CAD;                 Risk Factors:Hypertension and Diabetes.  Sonographer:    Jyl Heinz Referring Phys: [id number] [place]  1. Left ventricular ejection fraction, by estimation, is 20 to 25%. The left ventricle has severely decreased function. The left ventricle has no regional wall motion abnormalities. Left ventricular diastolic parameters are indeterminate. There is akinesis of the left ventricular, basal-mid inferoseptal wall and anteroseptal wall. There is akinesis of the left ventricular, apical septal wall. There is akinesis of the left ventricular, mid-apical inferior wall and anterior wall. There is hypokinesis of the left ventricular, entire lateral wall and inferolateral wall.  2. Right ventricular systolic function is mildly reduced. The right ventricular size is  mildly enlarged. The estimated right ventricular systolic pressure is 74.1 mmHg.  3. Right atrial size was moderately dilated.  4. The mitral valve is normal in structure. Trivial mitral valve regurgitat

## 2021-12-13 NOTE — Progress Notes (Signed)
Heart Failure Stewardship Pharmacist Progress Note ? ? ?PCP: [name], DO ?PCP-Cardiologist: Peter Martinique, MD  ? ? ?HPI:  ?56 yo M with PMH of CAD s/p PCI, HTN, ICM, syncope, HLD, T2DM, Barrett's esophagus, adrenal tumor s/p adrenal gland resection. He presented to the ED on  4/12 complaining of rapid heart rate found to be in VT. S/p cardioversion in ED. CXR with cardiomegaly. An ECHO was done 4/12 and LVEF 20-25% (was 40-45% in July 2021; 35-40% in May 2020) with mildly reduced RV. R/LHC on 4/13 with significant progression of 3v CAD needing PCI/atherectomy in LAD and well-compensated hemodynamics. LVEF estimated to be 30-35% on cath. Scheduled for ICD implant on 4/14. ? ?Current HF Medications: ?Beta Blocker: carvedilol 3.125 mg BID ?ACE/ARB/ARNI: losartan 12.5 mg daily ?SGLT2i: Farxiga 10 mg daily ? ?Prior to admission HF Medications: ?None ? ?Pertinent Lab Values: ?Serum creatinine 0.89, BUN 17, Potassium 3.4, Sodium 135, A1c 7.1  ? ?Vital Signs: ?Weight: 144 lbs (admission weight: 151 lbs) ?Blood pressure: 120/70s ?Heart rate: 60-70s  ?I/O: not well documented ? ?Medication Assistance / Insurance Benefits Check: ?Does the patient have prescription insurance?  Yes ?Type of insurance plan: [employer] commercial insurance ? ?Outpatient Pharmacy:  ?[name] ?Is the patient willing to use [place] pharmacy at discharge? Yes ?Is the patient willing to transition their outpatient pharmacy to utilize a [facility] outpatient pharmacy?   Pending ?  ? ?Assessment: ?1. Acute systolic CHF (EF 54-36%), due to ICM. ICD implant today. Elective PCI/atherectomy of LAD as outpatient. NYHA class II symptoms. ?- Mildly volume up on RHC - wedge 14. No diuretics. May need PRN dosing on discharge ?- Agree with adding carvedilol 3.125 mg BID - has had hypotension in the past with BB, watch closely. ?- Agree with adding losartan 12.5 mg daily ?- Consider adding spironolactone 12.5 mg  daily prior to discharge if BP allows ?- Agree with adding Farxiga 10 mg daily ?  ?Plan: ?1) Medication changes recommended at this time: ?- Agree with changes as above ? ?2) Patient assistance: ?- Patient required to fill at [facility], not contracted with [facility] ?- Patient will pay 100% of cost if fills at other pharmacy ? ?3)  Education  ?- To be completed prior to discharge ? ?Kerby Nora, PharmD, BCPS ?Heart Failure Stewardship Pharmacist ?Phone [phone] ? ? ?

## 2021-12-13 NOTE — TOC CM/SW Note (Signed)
HF TOC CM went to speak to pt and down for procedure. Left information on Legal Aide, free attorney services. His Unit RN will review with pt. Will continue to follow for dc needs. Jonnie Finner RN3 CCM, Heart Failure TOC CM [phone]  ?

## 2021-12-13 NOTE — Progress Notes (Addendum)
?Cardiology Progress Note  ?Patient ID: Andrew Huerta ?MRN: [id number] ?DOB: 1965/11/21 ?Date of Encounter: 12/14/2021 ? ?Primary Cardiologist: Peter Martinique, MD ? ?Subjective  ? ?Subjective: Feels well this morning. Mild pain at ICD site. ? ?O/N: Underwent ICD placement with EP. No further VT overnight. CXR normal with no PTX.  ? ?ROS:  ?All other ROS reviewed and negative. Pertinent positives noted in the HPI.    ? ?Inpatient Medications  ?Scheduled Meds: ? amiodarone  400 mg Oral BID  ? atorvastatin  80 mg Oral Daily  ? carvedilol  3.125 mg Oral BID WC  ? Chlorhexidine Gluconate Cloth  6 each Topical Daily  ? dapagliflozin propanediol  10 mg Oral Daily  ? glipiZIDE  5 mg Oral Daily  ? insulin aspart  0-24 Units Subcutaneous TID WC  ? insulin aspart  0-5 Units Subcutaneous QHS  ? pantoprazole  40 mg Oral Daily  ? sacubitril-valsartan  1 tablet Oral BID  ? sertraline  200 mg Oral Daily  ? sodium chloride flush  3 mL Intravenous Q12H  ? sodium chloride flush  3 mL Intravenous Q12H  ? spironolactone  12.5 mg Oral Daily  ? ?Continuous Infusions: ? sodium chloride    ? ?PRN Meds: ?sodium chloride, acetaminophen, etomidate, hydrALAZINE, HYDROcodone-acetaminophen, nitroGLYCERIN, ondansetron (ZOFRAN) IV, prochlorperazine, sodium chloride flush  ? ?Vital Signs  ? ?Vitals:  ? 12/13/21 1900 12/13/21 2132 12/14/21 0530 12/14/21 0728  ?BP:  (!) 140/92 (!) 146/91 132/86  ?Pulse: 65 69 62 65  ?Resp:  '16 18 18  '$ ?Temp:  97.7 ?F (36.5 ?C) 97.9 ?F (36.6 ?C) 97.8 ?F (36.6 ?C)  ?TempSrc:  Oral Oral Oral  ?SpO2: 95% 96% 96% 97%  ?Weight:      ?Height:      ? ? ?Intake/Output Summary (Last 24 hours) at 12/14/2021 1046 ?Last data filed at 12/14/2021 0900 ?Gross per 24 hour  ?Intake 244.4 ml  ?Output 2100 ml  ?Net -1855.6 ml  ? ? ?  12/13/2021  ?  6:00 AM 12/12/2021  ?  5:00 AM 12/11/2021  ?  5:55 PM  ?Last 3 Weights  ?Weight (lbs) 144 lb 10 oz 151 lb 14.4 oz 151 lb 14.4 oz  ?Weight (kg) 65.6 kg 68.9 kg 68.9 kg  ?   ? ?Telemetry   ?NSR-personally reviewed ? ? ?Physical Exam  ? ?Vitals:  ? 12/13/21 1900 12/13/21 2132 12/14/21 0530 12/14/21 0728  ?BP:  (!) 140/92 (!) 146/91 132/86  ?Pulse: 65 69 62 65  ?Resp:  '16 18 18  '$ ?Temp:  97.7 ?F (36.5 ?C) 97.9 ?F (36.6 ?C) 97.8 ?F (36.6 ?C)  ?TempSrc:  Oral Oral Oral  ?SpO2: 95% 96% 96% 97%  ?Weight:      ?Height:      ?  ?Intake/Output Summary (Last 24 hours) at 12/14/2021 1046 ?Last data filed at 12/14/2021 0900 ?Gross per 24 hour  ?Intake 244.4 ml  ?Output 2100 ml  ?Net -1855.6 ml  ?  ? ?  12/13/2021  ?  6:00 AM 12/12/2021  ?  5:00 AM 12/11/2021  ?  5:55 PM  ?Last 3 Weights  ?Weight (lbs) 144 lb 10 oz 151 lb 14.4 oz 151 lb 14.4 oz  ?Weight (kg) 65.6 kg 68.9 kg 68.9 kg  ?  Body mass index is 22.65 kg/m?.  ?General: Well nourished, well developed, in no acute distress ?Head: Atraumatic, normal size  ?Eyes: PEERLA, EOMI  ?Neck: Supple, no JVD ?Cardiac: Normal S1, S2; 1/6 systolic murmur ?Lungs: Clear to  auscultation bilaterally, no wheezing, rhonchi or rales  ?Abd: Soft, nontender, no hepatomegaly  ?Ext: No edema, pulses 2+ ?Musculoskeletal: Left ICD site wrapped ?Skin: Warm and dry, no rashes   ?Neuro: Alert and oriented to person, place, time, and situation, CNII-XII grossly intact, no focal deficits  ?Psych: Normal mood and affect  ? ?Labs  ?High Sensitivity Troponin:   ?Recent Labs  ?Lab 12/11/21 ?1347 12/11/21 ?1552  ?TROPONINIHS 196* 265*  ?   ?Cardiac EnzymesNo results for input(s): TROPONINI in the last 168 hours. No results for input(s): TROPIPOC in the last 168 hours.  ?Chemistry ?Recent Labs  ?Lab 12/11/21 ?1347 12/12/21 ?0104 12/12/21 ?3976 12/12/21 ?0840 12/13/21 ?0036 12/14/21 ?0234  ?NA 139 138   < > 139  141 135 136  ?K 4.4 4.3   < > 4.0  3.8 3.4* 3.7  ?CL 110 111  --   --  107 108  ?CO2 14* 21*  --   --  23 22  ?GLUCOSE 346* 179*  --   --  100* 101*  ?BUN 21* 23*  --   --  17 16  ?CREATININE 1.75* 1.10  --   --  0.89 1.01  ?CALCIUM 8.3* 8.9  --   --  8.4* 8.8*  ?PROT 5.8*  --   --   --    --   --   ?ALBUMIN 3.4*  --   --   --   --   --   ?AST 43*  --   --   --   --   --   ?ALT 31  --   --   --   --   --   ?ALKPHOS 42  --   --   --   --   --   ?BILITOT 1.4*  --   --   --   --   --   ?GFRNONAA 45* >60  --   --  >60 >60  ?ANIONGAP 15 6  --   --  5 6  ? < > = values in this interval not displayed.  ?  ?Hematology ?Recent Labs  ?Lab 12/12/21 ?0104 12/12/21 ?7341 12/12/21 ?0840 12/13/21 ?0036 12/14/21 ?0234  ?WBC 16.3*  --   --  13.3* 13.5*  ?RBC 4.07*  --   --  3.72* 4.28  ?HGB 12.5*   < > 11.6*  11.6* 11.8* 13.2  ?HCT 36.8*   < > 34.0*  34.0* 33.3* 38.7*  ?MCV 90.4  --   --  89.5 90.4  ?MCH 30.7  --   --  31.7 30.8  ?MCHC 34.0  --   --  35.4 34.1  ?RDW 13.0  --   --  13.1 13.0  ?PLT 168  --   --  127* 163  ? < > = values in this interval not displayed.  ? ?BNPNo results for input(s): BNP, PROBNP in the last 168 hours.  ?DDimer No results for input(s): DDIMER in the last 168 hours.  ? ?Radiology  ?DG Chest 2 View ? ?Result Date: 12/14/2021 ?CLINICAL DATA:  Tachycardia, pacemaker follow-up EXAM: CHEST - 2 VIEW COMPARISON:  December 11, 2021 FINDINGS: The heart size and mediastinal contours are within normal limits. Single lead cardiac pacemaker is identified with the tip projected over the right ventricle. both lungs are clear. There is no pneumothorax. The visualized skeletal structures are unremarkable. IMPRESSION: No active cardiopulmonary disease. Single lead cardiac pacemaker is identified with the tip projected over the right  ventricle. Electronically Signed   By: Abelardo Diesel M.D.   On: 12/14/2021 08:31   ? ?Cardiac Studies  ?TTE 12/11/2021 ? 1. Left ventricular ejection fraction, by estimation, is 20 to 25%. The  ?left ventricle has severely decreased function. The left ventricle has no  ?regional wall motion abnormalities. Left ventricular diastolic parameters  ?are indeterminate. There is  ?akinesis of the left ventricular, basal-mid inferoseptal wall and  ?anteroseptal wall. There is akinesis of  the left ventricular, apical  ?septal wall. There is akinesis of the left ventricular, mid-apical  ?inferior wall and anterior wall. There is  ?hypokinesis of the left ventricular, entire lateral wall and inferolateral  ?wall.  ? 2. Right ventricular systolic function is mildly reduced. The right  ?ventricular size is mildly enlarged. The estimated right ventricular  ?systolic pressure is 77.4 mmHg.  ? 3. Right atrial size was moderately dilated.  ? 4. The mitral valve is normal in structure. Trivial mitral valve  ?regurgitation. No evidence of mitral stenosis.  ? 5. The aortic valve is normal in structure. Aortic valve regurgitation is  ?not visualized. Aortic valve sclerosis is present, with no evidence of  ?aortic valve stenosis.  ? 6. The inferior vena cava is dilated in size with <50% respiratory  ?variability, suggesting right atrial pressure of 15 mmHg.  ? ?LHC/RHC 12/12/2021 ?  Mid LAD lesion is 30% stenosed. ?  Mid RCA to Dist RCA lesion is 100% stenosed. ?  Mid RCA lesion is 99% stenosed. ?  Prox RCA lesion is 40% stenosed. ?  Ost LAD to Prox LAD lesion is 90% stenosed. ?  Non-stenotic Prox LAD lesion was previously treated. ?  Non-stenotic Prox LAD to Mid LAD lesion was previously treated. ?  Non-stenotic Ost Cx to Prox Cx lesion was previously treated. ?  The left ventricular ejection fraction is 25-35% by visual estimate. ?  ?Findings: ?  ?Ao = 110/68 (86) ?LV = 115/16 ?RA = 10  ?RV = 30/13 ?PA = 28/15 (21) ?PCW = 14 ?Fick cardiac output/index = 5.2/2.9 ?PVR = 1.3 WU ?FA sat = 97% ?PA sat = 69%, 70% ?PAPi = 1.3 ?  ?Assessment: ?1. 3v CAD with 90% lesion in proximal LAD prior to previous stent otherwise stable CAD with patency of previous LAD & LCx stents ?2. iCM EF 30-35% ?3. Relatively well-compensated hemodynamics with evidence of RV dysfunction ? ?Patient Profile  ?56 year old male with history of CAD status post PCI to the LAD, circumflex and RCA, occluded RCA with collateral flow, hypertension,  ischemic cardiomyopathy with a EF 40-45% who was admitted on 12/11/2021 with ventricular tachycardia. ? ?Assessment & Plan  ? ?#Monomorphic ventricular tachycardia ?#Cardiogenic shock ?-Admitted with several hours of ventricul

## 2021-12-13 NOTE — Plan of Care (Signed)

## 2021-12-14 ENCOUNTER — Inpatient Hospital Stay (HOSPITAL_COMMUNITY): Payer: BC Managed Care – PPO

## 2021-12-14 DIAGNOSIS — I25118 Atherosclerotic heart disease of native coronary artery with other forms of angina pectoris: Secondary | ICD-10-CM | POA: Diagnosis not present

## 2021-12-14 DIAGNOSIS — I472 Ventricular tachycardia, unspecified: Secondary | ICD-10-CM | POA: Diagnosis not present

## 2021-12-14 DIAGNOSIS — R570 Cardiogenic shock: Secondary | ICD-10-CM | POA: Diagnosis not present

## 2021-12-14 DIAGNOSIS — I5023 Acute on chronic systolic (congestive) heart failure: Secondary | ICD-10-CM | POA: Diagnosis not present

## 2021-12-14 LAB — BASIC METABOLIC PANEL
Anion gap: 6 (ref 5–15)
BUN: 16 mg/dL (ref 6–20)
CO2: 22 mmol/L (ref 22–32)
Calcium: 8.8 mg/dL — ABNORMAL LOW (ref 8.9–10.3)
Chloride: 108 mmol/L (ref 98–111)
Creatinine, Ser: 1.01 mg/dL (ref 0.61–1.24)
GFR, Estimated: >60 mL/min (ref >60)
Glucose, Bld: 101 mg/dL — ABNORMAL HIGH (ref 70–99)
Potassium: 3.7 mmol/L (ref 3.5–5.1)
Sodium: 136 mmol/L (ref 135–145)

## 2021-12-14 LAB — URINE CULTURE: Culture: NO GROWTH

## 2021-12-14 LAB — CBC
HCT: 38.7 % — ABNORMAL LOW (ref 39.0–52.0)
Hemoglobin: 13.2 g/dL (ref 13.0–17.0)
MCH: 30.8 pg (ref 26.0–34.0)
MCHC: 34.1 g/dL (ref 30.0–36.0)
MCV: 90.4 fL (ref 80.0–100.0)
Platelets: 163 10*3/uL (ref 150–400)
RBC: 4.28 MIL/uL (ref 4.22–5.81)
RDW: 13 % (ref 11.5–15.5)
WBC: 13.5 10*3/uL — ABNORMAL HIGH (ref 4.0–10.5)
nRBC: 0 % (ref 0.0–0.2)

## 2021-12-14 LAB — GLUCOSE, CAPILLARY
Glucose-Capillary: 90 mg/dL (ref 70–99)
Glucose-Capillary: 160 mg/dL — ABNORMAL HIGH (ref 70–99)

## 2021-12-14 MED ORDER — CARVEDILOL 3.125 MG PO TABS: 3.1250 mg | ORAL_TABLET | Freq: Two times a day (BID) | ORAL | 0 refills | Status: DC

## 2021-12-14 MED ORDER — AMIODARONE HCL 200 MG PO TABS: ORAL_TABLET | ORAL | 0 refills | Status: DC

## 2021-12-14 MED ORDER — SPIRONOLACTONE 12.5 MG HALF TABLET
12.5000 mg | ORAL_TABLET | Freq: Every day | ORAL | Status: DC
  Filled 2021-12-14: qty 1

## 2021-12-14 MED ORDER — ATORVASTATIN CALCIUM 80 MG PO TABS: 80.0000 mg | ORAL_TABLET | Freq: Every day | ORAL | 1 refills | Status: DC

## 2021-12-14 MED ORDER — DAPAGLIFLOZIN PROPANEDIOL 10 MG PO TABS: 10.0000 mg | ORAL_TABLET | Freq: Every day | ORAL | 0 refills | Status: DC

## 2021-12-14 MED ORDER — SACUBITRIL-VALSARTAN 24-26 MG PO TABS
1.0000 | ORAL_TABLET | Freq: Two times a day (BID) | ORAL | Status: DC
  Administered 2021-12-14: 1 via ORAL
  Filled 2021-12-14: qty 1

## 2021-12-14 MED ORDER — SACUBITRIL-VALSARTAN 24-26 MG PO TABS: 1.0000 | ORAL_TABLET | Freq: Two times a day (BID) | ORAL | 0 refills | Status: DC

## 2021-12-14 NOTE — Discharge Summary (Addendum)
?Discharge Summary  ?  ?Patient ID: Andrew Huerta ?MRN: [id number]; DOB: 1965/09/04 ? ?Admit date: 12/11/2021 ?Discharge date: 12/14/2021 ? ?PCP:  Andrew Del, DO ?  ?Andrew Huerta ?Cardiologist:  Andrew Martinique, MD   {   ? ?Discharge Diagnoses  ?  ?Principal Problem: ?  Ventricular tachycardia, sustained ([place]) ?Active Problems: ?  Essential hypertension ?  CAD ?  Hyperlipidemia associated with type 2 diabetes mellitus ([place]) ?  AKI (acute kidney injury) ([place]) ?  Hypothermia ? ?Diagnostic Studies/Procedures  ?  ?TTE 12/11/2021 ? 1. Left ventricular ejection fraction, by estimation, is 20 to 25%. The  ?left ventricle has severely decreased function. The left ventricle has no  ?regional wall motion abnormalities. Left ventricular diastolic parameters  ?are indeterminate. There is  ?akinesis of the left ventricular, basal-mid inferoseptal wall and  ?anteroseptal wall. There is akinesis of the left ventricular, apical  ?septal wall. There is akinesis of the left ventricular, mid-apical  ?inferior wall and anterior wall. There is  ?hypokinesis of the left ventricular, entire lateral wall and inferolateral  ?wall.  ? 2. Right ventricular systolic function is mildly reduced. The right  ?ventricular size is mildly enlarged. The estimated right ventricular  ?systolic pressure is 73.5 mmHg.  ? 3. Right atrial size was moderately dilated.  ? 4. The mitral valve is normal in structure. Trivial mitral valve  ?regurgitation. No evidence of mitral stenosis.  ? 5. The aortic valve is normal in structure. Aortic valve regurgitation is  ?not visualized. Aortic valve sclerosis is present, with no evidence of  ?aortic valve stenosis.  ? 6. The inferior vena cava is dilated in size with <50% respiratory  ?variability, suggesting right atrial pressure of 15 mmHg.  ?  ?LHC/RHC 12/12/2021 ?  Mid LAD lesion is 30% stenosed. ?  Mid RCA to Dist RCA lesion is 100% stenosed. ?  Mid RCA lesion is 99% stenosed. ?  Prox RCA lesion is 40%  stenosed. ?  Ost LAD to Prox LAD lesion is 90% stenosed. ?  Non-stenotic Prox LAD lesion was previously treated. ?  Non-stenotic Prox LAD to Mid LAD lesion was previously treated. ?  Non-stenotic Ost Cx to Prox Cx lesion was previously treated. ?  The left ventricular ejection fraction is 25-35% by visual estimate. ?  ?Findings: ?  ?Ao = 110/68 (86) ?LV = 115/16 ?RA = 10  ?RV = 30/13 ?PA = 28/15 (21) ?PCW = 14 ?Fick cardiac output/index = 5.2/2.9 ?PVR = 1.3 WU ?FA sat = 97% ?PA sat = 69%, 70% ?PAPi = 1.3 ?  ?Assessment: ?1. 3v CAD with 90% lesion in proximal LAD prior to previous stent otherwise stable CAD with patency of previous LAD & LCx stents ?2. iCM EF 30-35% ?3. Relatively well-compensated hemodynamics with evidence of RV dysfunction ?_____________ ?  ?History of Present Illness   ?  ?Andrew Huerta is a 56 y.o. male with CAD, HTN, ICM, syncope, HLD, chronic back pain, DM2, Barrett's esophagus, adrenal tumor s/p adrenal gland resection a Andrew Huerta, primary hyperaldosteronism s/p adrenalectomy, ODS who was seen 12/11/2021 for the evaluation of VT. ? ?Andrew Huerta is followed by Dr. Martinique for the above cardiac issues. He had abnormal stress tet in 2015 with subsequent cardiac catheterization showing 2V disease with stenting of mLAD and mLCx. Repeat cath for recurrent chest pain showed patent steents. Admission May 2020 with chest discomfort. Echo 01/04/19 showed LVEF 35-40%, new inferior wall motiont abnormality. Cardiac cath showed 99% mRCA disease, 100% mid to [facility]  to prox LAD disease, patent prox LAD stent, 80% prox to mid LAD lesion treated with DES and patent Lcx stent. He was discharged on Aspirin and Plavix . ?  ?Previous intolerance to Imdur and could not afford Ranexa.  ?  ?He had a syncopal episdoes in 02/21/20. 30 day heart monitor showed  NSR, rare PACs, occasinoal PVCs with 4% burden. Echo showed improvement in LVEF 40-45%. Coreg was subsequently discontinued.  ?  ?He was seen again for syncope vs seizure  with EEG and previous MRI head unremarkable. Suspected orthostatic vs vasovagal. Carotid duplex was ordered. No anginal symptoms.  ?  ?The patient presented to the ER at [facility] 12/11/21 for tachycardia. Patient reported he started feeling badly last night. He had weakness, chills, nausea, vomiting, palpitations and SOB. Did have chest pain, but reported chronic chest pain that has not been worse. He denies LLE, orthopnea, pnd. Question as to whether he was taking all his mediations. The morning of admission EMS was called who found him to be in VT with heart rate up to 160bpm. He was given 2 infusions of IV amiodarone by EMS.  ?  ?In the ER he was shocked x1 and converted to NSR, and started on IVF.  BG 357. BP soft on arrival with subsequent improvement. Admitted for cardiology for further management.  ?  ? ?Hospital Course  ?   ?Consultants: EP  ? ?Monomorphic ventricular tachycardia/Cardiogenic shock: Admitted with several hours of ventricular tachycardia.  Cardioverted in the emergency room. He did have a new lesion in the LAD but it was felt his VT came from inferior scar. RHC without evidence of cardiogenic shock: CI 2.9, CO 5.2 ?-- seen by EP now s/p ICD placement on 4/14. ?-- Transitioned to amiodarone '400mg'$  BID (plan for '400mg'$  BID x7 days, '200mg'$  BID x7 days, then '200mg'$  daily thereafter ?-- Plan to return as outpatient for PCI to the LAD and 2 weeks ?-- ICD instructions given via EP ?  ?Acute systolic heart failure, EF 20-25%: Filling pressures are normal right heart cath 4/13, EF dropped from VT.  ?-- Reportedly intolerant of medications in the past. However, slowly re-challenging him as inpatient and doing well ?-- GDMT: Continue coreg 3.'125mg'$  BID, changed losartan to entresto 24/'26mg'$  BID at discharge, farxiga '10mg'$  daily ?-- No spiro as developed gynecomastia in the past; can consider eplerenone as outpatient ?-- Reinforce medication compliance ?  ?Hx of CAD s/p PCI?Severe pLAD stenosis: Left heart cath with  90% proximal LAD lesion. VT thought to be scar mediated rather than from ischemia from LAD lesion.  ?-- s/p ICD placement. Will plan to bring back in 2 weeks for LAD PCI. ?-- resumed ASA/plavix at discharge ?  ?Leukocytosis/Hypothermia/Shock: ?-- White count was elevated on admission, but trended down. ?-- Procalcitonin negative.  Chest x-ray clear.  Blood cultures negative.  No obvious source of any infection. Likely related to VT ?-- No antibiotics ?  ?AKI: resolved ?  ?DM: Hgb A1c 7.1 ?-- resume glipizide and start farxgia at discharge  ?  ?HLD: continue lipitor '80mg'$  daily ?-- LDL 130 ?-- not at goal, noncompliance is an issue ? ?Patient was seen by Dr. Johney Frame and deemed stable for discharge home. Follow up appt arranged in the office. Medications sent to pharmacy. Educated by PharmD prior to discharge.  ? ?Did the patient have an acute coronary syndrome (MI, NSTEMI, STEMI, etc) this admission?:  No.   ? ?The patient will be scheduled for a TOC follow up appointment  in 10-14 days.  A message has been sent to the [facility] and Scheduling Pool at the office where the patient should be seen for follow up.  ?_____________ ? ?Discharge Vitals ?Blood pressure (!) 142/96, pulse 63, temperature 97.8 ?F (36.6 ?C), temperature source Oral, resp. rate 18, height '5\' 7"'$  (1.702 m), weight 65.6 kg, SpO2 97 %.  ?Filed Weights  ? 12/11/21 1755 12/12/21 0500 12/13/21 0600  ?Weight: 68.9 kg 68.9 kg 65.6 kg  ? ? ?Labs & Radiologic Studies  ?  ?CBC ?Recent Labs  ?  12/11/21 ?1347 12/12/21 ?0104 12/13/21 ?0036 12/14/21 ?0234  ?WBC 21.9*   < > 13.3* 13.5*  ?NEUTROABS 17.2*  --   --   --   ?HGB 12.9*   < > 11.8* 13.2  ?HCT 39.7   < > 33.3* 38.7*  ?MCV 96.8   < > 89.5 90.4  ?PLT 149*   < > 127* 163  ? < > = values in this interval not displayed.  ? ?Basic Metabolic Panel ?Recent Labs  ?  12/11/21 ?1347 12/12/21 ?0104 12/13/21 ?0036 12/14/21 ?0234  ?NA 139   < > 135 136  ?K 4.4   < > 3.4* 3.7  ?CL 110   < > 107 108  ?CO2 14*   < > 23 22   ?GLUCOSE 346*   < > 100* 101*  ?BUN 21*   < > 17 16  ?CREATININE 1.75*   < > 0.89 1.01  ?CALCIUM 8.3*   < > 8.4* 8.8*  ?MG 1.8  --   --   --   ? < > = values in this interval not displayed.  ? ?L[facility] Fu

## 2021-12-14 NOTE — Progress Notes (Signed)
Patient examined.  Wound without hematoma.  Dressing removed. ?Instructions given. ?Device interrogation normal ?

## 2021-12-16 ENCOUNTER — Encounter (HOSPITAL_COMMUNITY): Payer: Self-pay | Admitting: Cardiology

## 2021-12-16 LAB — CULTURE, BLOOD (ROUTINE X 2)
Culture: NO GROWTH
Culture: NO GROWTH
Special Requests: ADEQUATE
Special Requests: ADEQUATE

## 2021-12-17 ENCOUNTER — Ambulatory Visit (INDEPENDENT_AMBULATORY_CARE_PROVIDER_SITE_OTHER): Payer: BC Managed Care – PPO | Admitting: Family Medicine

## 2021-12-17 ENCOUNTER — Other Ambulatory Visit: Payer: Self-pay

## 2021-12-17 ENCOUNTER — Emergency Department (HOSPITAL_COMMUNITY): Payer: BC Managed Care – PPO

## 2021-12-17 ENCOUNTER — Emergency Department (HOSPITAL_COMMUNITY)
Admission: EM | Admit: 2021-12-17 | Discharge: 2021-12-17 | Disposition: A | Discharge status: Home / Self Care | Payer: BC Managed Care – PPO | Attending: Emergency Medicine | Admitting: Emergency Medicine

## 2021-12-17 ENCOUNTER — Encounter: Payer: Self-pay | Admitting: Family Medicine

## 2021-12-17 VITALS — BP 105/78 | HR 67 | Ht 67.0 in | Wt 143.5 lb

## 2021-12-17 DIAGNOSIS — I25118 Atherosclerotic heart disease of native coronary artery with other forms of angina pectoris: Secondary | ICD-10-CM

## 2021-12-17 DIAGNOSIS — R61 Generalized hyperhidrosis: Secondary | ICD-10-CM | POA: Diagnosis not present

## 2021-12-17 DIAGNOSIS — Z79899 Other long term (current) drug therapy: Secondary | ICD-10-CM | POA: Diagnosis not present

## 2021-12-17 DIAGNOSIS — I472 Ventricular tachycardia, unspecified: Secondary | ICD-10-CM | POA: Insufficient documentation

## 2021-12-17 DIAGNOSIS — I959 Hypotension, unspecified: Secondary | ICD-10-CM | POA: Insufficient documentation

## 2021-12-17 DIAGNOSIS — R531 Weakness: Secondary | ICD-10-CM | POA: Insufficient documentation

## 2021-12-17 DIAGNOSIS — R110 Nausea: Secondary | ICD-10-CM | POA: Diagnosis not present

## 2021-12-17 DIAGNOSIS — Z95810 Presence of automatic (implantable) cardiac defibrillator: Secondary | ICD-10-CM | POA: Diagnosis not present

## 2021-12-17 DIAGNOSIS — I10 Essential (primary) hypertension: Secondary | ICD-10-CM | POA: Diagnosis not present

## 2021-12-17 DIAGNOSIS — Z7982 Long term (current) use of aspirin: Secondary | ICD-10-CM | POA: Diagnosis not present

## 2021-12-17 DIAGNOSIS — R55 Syncope and collapse: Secondary | ICD-10-CM | POA: Insufficient documentation

## 2021-12-17 DIAGNOSIS — E1159 Type 2 diabetes mellitus with other circulatory complications: Secondary | ICD-10-CM

## 2021-12-17 DIAGNOSIS — R1084 Generalized abdominal pain: Secondary | ICD-10-CM | POA: Diagnosis not present

## 2021-12-17 DIAGNOSIS — Z09 Encounter for follow-up examination after completed treatment for conditions other than malignant neoplasm: Secondary | ICD-10-CM

## 2021-12-17 DIAGNOSIS — R231 Pallor: Secondary | ICD-10-CM | POA: Diagnosis not present

## 2021-12-17 DIAGNOSIS — R42 Dizziness and giddiness: Secondary | ICD-10-CM | POA: Diagnosis not present

## 2021-12-17 DIAGNOSIS — E782 Mixed hyperlipidemia: Secondary | ICD-10-CM

## 2021-12-17 DIAGNOSIS — R739 Hyperglycemia, unspecified: Secondary | ICD-10-CM

## 2021-12-17 LAB — CBC WITH DIFFERENTIAL/PLATELET
Abs Immature Granulocytes: 0.38 10*3/uL — ABNORMAL HIGH (ref 0.00–0.07)
Basophils Absolute: 0.2 10*3/uL — ABNORMAL HIGH (ref 0.0–0.1)
Basophils Relative: 1 %
Eosinophils Absolute: 0.2 10*3/uL (ref 0.0–0.5)
Eosinophils Relative: 1 %
HCT: 49 % (ref 39.0–52.0)
Hemoglobin: 16.4 g/dL (ref 13.0–17.0)
Immature Granulocytes: 3 %
Lymphocytes Relative: 22 %
Lymphs Abs: 3.1 10*3/uL (ref 0.7–4.0)
MCH: 30.7 pg (ref 26.0–34.0)
MCHC: 33.5 g/dL (ref 30.0–36.0)
MCV: 91.6 fL (ref 80.0–100.0)
Monocytes Absolute: 0.9 10*3/uL (ref 0.1–1.0)
Monocytes Relative: 7 %
Neutro Abs: 9.4 10*3/uL — ABNORMAL HIGH (ref 1.7–7.7)
Neutrophils Relative %: 66 %
Platelets: 214 10*3/uL (ref 150–400)
RBC: 5.35 MIL/uL (ref 4.22–5.81)
RDW: 13.2 % (ref 11.5–15.5)
WBC: 14 10*3/uL — ABNORMAL HIGH (ref 4.0–10.5)
nRBC: 0 % (ref 0.0–0.2)

## 2021-12-17 LAB — TROPONIN I (HIGH SENSITIVITY)
Troponin I (High Sensitivity): 65 ng/L — ABNORMAL HIGH (ref <18)
Troponin I (High Sensitivity): 63 ng/L — ABNORMAL HIGH (ref <18)

## 2021-12-17 LAB — COMPREHENSIVE METABOLIC PANEL
ALT: 25 U/L (ref 0–44)
AST: 23 U/L (ref 15–41)
Albumin: 4 g/dL (ref 3.5–5.0)
Alkaline Phosphatase: 60 U/L (ref 38–126)
Anion gap: 10 (ref 5–15)
BUN: 18 mg/dL (ref 6–20)
CO2: 25 mmol/L (ref 22–32)
Calcium: 9.6 mg/dL (ref 8.9–10.3)
Chloride: 104 mmol/L (ref 98–111)
Creatinine, Ser: 1.06 mg/dL (ref 0.61–1.24)
GFR, Estimated: >60 mL/min (ref >60)
Glucose, Bld: 193 mg/dL — ABNORMAL HIGH (ref 70–99)
Potassium: 4.4 mmol/L (ref 3.5–5.1)
Sodium: 139 mmol/L (ref 135–145)
Total Bilirubin: 0.9 mg/dL (ref 0.3–1.2)
Total Protein: 7.3 g/dL (ref 6.5–8.1)

## 2021-12-17 LAB — GLUCOSE, POCT (MANUAL RESULT ENTRY): POC Glucose: 190 mg/dl — AB (ref 70–99)

## 2021-12-17 LAB — BRAIN NATRIURETIC PEPTIDE: B Natriuretic Peptide: 192.3 pg/mL — ABNORMAL HIGH (ref 0.0–100.0)

## 2021-12-17 LAB — PROTIME-INR
INR: 1.1 (ref 0.8–1.2)
Prothrombin Time: 13.8 seconds (ref 11.4–15.2)

## 2021-12-17 LAB — CBG MONITORING, ED: Glucose-Capillary: 190 mg/dL — ABNORMAL HIGH (ref 70–99)

## 2021-12-17 MED ORDER — SODIUM CHLORIDE 0.9 % IV SOLN: INTRAVENOUS | Status: DC

## 2021-12-17 NOTE — Discharge Instructions (Addendum)
The cardiology team will reach out to you to arrange for follow-up within the next week. ? ?If you have new or worsening chest pain, or further episodes of loss of consciousness, you should come back to the emergency department ?

## 2021-12-17 NOTE — H&P (View-Only) (Signed)
?Consult Note  ? ?Patient ID: Andrew Huerta ?MRN: [id number]; DOB: 1966-02-01  ? ?Admission date: 12/17/2021 ? ?PCP:  [name], DO ?  ?[facility] Providers ?Cardiologist:  Peter Martinique, MD  ?Electrophysiologist:  [name], MD     ? ? ?Chief Complaint:  Pre-syncope, hypotension ? ?Patient Profile:  ? ?Andrew Huerta is a 56 y.o. male with CAD, chronic back pain, HTN, HLD, DM II, Barrett's esophagus, history of adrenal tumor s/p adrenal gland resection at [facility], hypoaldosteronism s/p adrenalectomy and obstructive sleep apnea who is being seen 12/17/2021 for the evaluation of pre-syncope and hypotension. ? ?History of Present Illness:  ? ?Andrew Huerta is a 56 year old male with past medical history of CAD, chronic back pain, HTN, HLD, DM II, Barrett's esophagus, history of adrenal tumor s/p adrenal gland resection at [facility], hypoaldosteronism s/p adrenalectomy and obstructive sleep apnea.  He had a abnormal Myoview in 2015 and subsequent cardiac catheterization showed two-vessel disease, he eventually underwent stenting of mid LAD and mid left circumflex artery.  Repeat cardiac catheterization in late 2015 and July 2016 showed patent stents.  Despite so, he continued to have intermittent chest discomfort.  He was admitted in May 2020 with vague chest pain.  Echocardiogram obtained on 01/04/2019 showed EF 35 to 40%, new inferior wall abnormality.  Cardiac catheterization performed on 01/04/2019 demonstrated 99% mid RCA disease, 100% mid to distal RCA lesion was distal left to right collaterals from LAD, 40% ostial to proximal LAD lesion, widely patent proximal LAD stent, 80% proximal to mid LAD lesion treated with Onyx DES, patent ostial to proximal left circumflex stent.  In 2021, he was evaluated multiple times for syncope.  He wore a 30-day monitor which showed normal sinus rhythm, rare PACs, occasional PVCs, PVC burden 4%.  Echocardiogram showed mild improvement in his EF to 40-45%.  MRI of the brain  showed no acute finding but chronic ischemic changes.  EEG in July 2021 was unremarkable.  He was last seen by Laurann Montana in July 2022 at which time he continued to have intermittent syncope.  It was suspected his syncope was either orthostatic versus vasovagal in the setting of not eating or drinking much and being overheated.  Carotid Doppler obtained on 03/12/2021 showed near normal carotid vessels with minimal plaque.  Antegrade flow in the bilateral vertebral artery, normal flow in bilateral subclavian arteries.  In the past he has intolerance of Imdur and could not afford Ranexa.  He also has a history of gynecomastia on spironolactone. ? ?More recently, patient presented to the emergency room on 12/11/2021 with complaints of weakness, chills, nausea, vomiting and palpitation.  On EMS arrival, he was noted to be in sustained VT with heart rate up to 160 bpm.  He was given 2 boluses of IV amiodarone and underwent defibrillation in the emergency room back to sinus rhythm.  He was subsequently admitted by cardiology service.  Echocardiogram obtained on the same day showed EF 20 to 25%, severely decreased LV function, akinesis of the LV basal mid inferoseptal wall and anteroseptal wall, RV systolic pressure 19.5 mmHg, mildly reduced RV EF, trivial MR.  He underwent left and right heart cath on that showed chronically occluded mid to distal RCA, 99% mid RCA lesion, 40% proximal RCA lesion, 90% ostial to proximal LAD lesion, EF 25 to 30%.  Cardiac output 5.2, cardiac index 2.9.  Wedge pressure 14 mmHg.  RV pressure 30/13.  Patient was seen by EP service who felt the monomorphic VT  was scar mediated and not ischemic driven, therefore it was recommended to proceed with ICD implantation first.  Aspirin and the Plavix was held for the procedure.  He underwent single lead [employer] VVI ICD for secondary prevention by Dr. Quentin Ore on 12/13/2021.  Postprocedure, it was recommended to continue to hold his dual  antiplatelet therapy for 5 days and restart on 12/19/2021 (although discharge note says both aspirin and Plavix has been resumed at discharge).  He was placed on down titrating dose of amiodarone.  Home losartan switched to low-dose Entresto.  He was also continued on Farxiga and carvedilol.  Patient was discharged with plan to proceed with PCI of LAD in 2 weeks. ? ?Post discharge, patient was initially feeling well for the first 2 days.  In the morning of 12/17/2021, he went to his family medicine doctor for appointment.  Around 9:15 AM, while sitting down talking with his family medicine doctor, he had sudden onset of nausea and flushing sensation.  He did not eat much food this morning, he only ate some chips and his medication.  He has some dry heave and appeared to be diaphoretic and weak.  Blood pressure was soft in the 80s over 60s, heart rate in the upper 50s.  Blood sugar 180.  O2 saturation 100%.  He was placed on oxygen anyway and EMS was called to transport him to the emergency room.  Total duration of the symptom only lasted about 20 minutes before completely resolved.  He did receive some IV fluid hydration in the ED.  He denied any chest discomfort or worsening dyspnea this morning.  Currently his blood pressure is back to normal in the low 100 range.  Lab work showed stable renal function and electrolyte.  BNP 192.3.  White blood cell count of 14.0.  The red blood cell count normal.  Chest x-ray shows no acute finding.  Stable position of single lead defibrillator.  EKG obtained by EMS showed sinus rhythm with minimal T wave inversion in 1 and aVL, no significant ST changes.  Cardiology service consulted for hypotension. ? ? ?Past Medical History:  ?Diagnosis Date  ? Adrenal tumor   ? a. s/p adrenal gland resection at [facility]. left side removal per patient  ? Anxiety   ? Barrett's esophagus   ? EGD - 11/27/09 - short segment of Barrett's  ? CAD (coronary artery disease)   ? a. 04/27/14 [place] s/p DES to mLAD  and DES to mLCx  ? Chronic back pain   ? upper and lower per patient  ? Chronic chest pain   ? Degenerative joint disease   ? Bilateral knees. Significant knee pain since playing football in high school. also in back per patient  ? Depression   ? Diabetes mellitus type 2, controlled ([place]) 08/29/2015  ? Diagnosed by A1c 7.6% during 08/26/15 hospital admission for chest pain  ? Gastroesophageal reflux disease with hiatal hernia   ? GERD (gastroesophageal reflux disease)   ? Hiatal hernia   ? EGD - 11/27/2009  ? Hyperlipidemia   ? Hypertension   ? Low back pain   ? Primary hyperaldosteronism ([place]) 01/22/2012  ? S/P adrenalectomy     ? PTSD (post-traumatic stress disorder)   ? Seizures ([place])   ? Sleep apnea   ? Stone, kidney   ? Syncope 04/10/2019  ? ? ?Past Surgical History:  ?Procedure Laterality Date  ? ADRENALECTOMY  10/2013  ? CARDIAC CATHETERIZATION  05/01/2014  ? Patent stents, other disease  unchanged  ? CARDIAC CATHETERIZATION  04/27/2014  ? Procedure: CORONARY STENT INTERVENTION;  Surgeon: Peter M Martinique, MD;  Location: [facility] CATH LAB;  Service: Cardiovascular;;  DES mid Cx  ?DES mid LAD  ? CARDIAC CATHETERIZATION N/A 03/22/2015  ? Procedure: Left Heart Cath and Coronary Angiography;  Surgeon: Sherren Mocha, MD;  Location: [place] CV LAB;  Service: Cardiovascular;  Laterality: N/A;  ? CORONARY ANGIOPLASTY WITH STENT PLACEMENT  04/27/2014  ? 3.0 x 16 mm Promus DES to the mid LAD and 3.5 x 28 mm Promus to the mid LCx, otherwise 20-30 percent lesions, EF 55%  ? CORONARY STENT INTERVENTION N/A 01/04/2019  ? Procedure: CORONARY STENT INTERVENTION;  Surgeon: Troy Sine, MD;  Location: [place] CV LAB;  Service: Cardiovascular;  Laterality: N/A;  ? ICD IMPLANT N/A 12/13/2021  ? Procedure: ICD IMPLANT;  Surgeon: [name], MD;  Location: [place] CV LAB;  Service: Cardiovascular;  Laterality: N/A;  ? KIDNEY STONE SURGERY  X 1  ? KNEE ARTHROPLASTY Right 1984  ? KNEE ARTHROSCOPY Right X 6  ? LEFT HEART CATH  AND CORONARY ANGIOGRAPHY N/A 01/04/2019  ? Procedure: LEFT HEART CATH AND CORONARY ANGIOGRAPHY;  Surgeon: Troy Sine, MD;  Location: [place] CV LAB;  Service: Cardiovascular;  Laterality: N/A;  ? LEFT HEA

## 2021-12-17 NOTE — ED Triage Notes (Signed)
Pt arrived by EMS from clinic appointment. Pt has defibrillator placed earlier this month and was at a follow up. It was placed for do to episodes of V. tach ? ?He has a sudden onset of diaphoresis, nausea and near syncope. Pt has been hypotensive with EMS   ? ?Pt is alert and oriented. Pt does not believe that he passed out. Still nauseous but states that he feels better.  ?

## 2021-12-17 NOTE — ED Provider Notes (Signed)
?[facility] ?Provider Note ? ? ?CSN: [id number] ?Arrival date & time:    ? ?  ? ?History ? ?Chief Complaint  ?Patient presents with  ? Near Syncope  ? ? ?Andrew Huerta is a 56 y.o. male. ? ?HPI ?Patient presents via EMS from clinic where he was evaluated after recent placement of cardiac defibrillator.  History is obtained by patient, EMS, and chart review. ?He notes that he was feeling well after discharge 3 days ago.  Since that time no events until today when he was in the clinic, had episode of near syncope versus syncope, was found to be hypotensive, diaphoretic, weak.  Heimproved somewhat, has no current complaints.  Per chart review patient has multiple medical issues including ischemic and nonischemic cardiac disease, had pacemaker placement last week.  Pertinent details including cath, echo, discharge summary included below: ? ?Monomorphic ventricular tachycardia/Cardiogenic shock: Admitted with several hours of ventricular tachycardia.  Cardioverted in the emergency room. He did have a new lesion in the LAD but it was felt his VT came from inferior scar. RHC without evidence of cardiogenic shock: CI 2.9, CO 5.2 ?-- seen by EP now s/p ICD placement on 4/14. ?-- Transitioned to amiodarone '400mg'$  BID (plan for '400mg'$  BID x7 days, '200mg'$  BID x7 days, then '200mg'$  daily thereafter ?-- Plan to return as outpatient for PCI to the LAD and 2 weeks ?-- ICD instructions given via EP ? ?  ?TTE 12/11/2021 ? 1. Left ventricular ejection fraction, by estimation, is 20 to 25%. The  ?left ventricle has severely decreased function. The left ventricle has no  ?regional wall motion abnormalities. Left ventricular diastolic parameters  ?are indeterminate. There is  ?akinesis of the left ventricular, basal-mid inferoseptal wall and  ?anteroseptal wall. There is akinesis of the left ventricular, apical  ?septal wall. There is akinesis of the left ventricular, mid-apical  ?inferior wall and  anterior wall. There is  ?hypokinesis of the left ventricular, entire lateral wall and inferolateral  ?wall.  ? 2. Right ventricular systolic function is mildly reduced. The right  ?ventricular size is mildly enlarged. The estimated right ventricular  ?systolic pressure is 02.5 mmHg.  ? 3. Right atrial size was moderately dilated.  ? 4. The mitral valve is normal in structure. Trivial mitral valve  ?regurgitation. No evidence of mitral stenosis.  ? 5. The aortic valve is normal in structure. Aortic valve regurgitation is  ?not visualized. Aortic valve sclerosis is present, with no evidence of  ?aortic valve stenosis.  ? 6. The inferior vena cava is dilated in size with <50% respiratory  ?variability, suggesting right atrial pressure of 15 mmHg.  ?  ?LHC/RHC 12/12/2021 ?  Mid LAD lesion is 30% stenosed. ?  Mid RCA to Dist RCA lesion is 100% stenosed. ?  Mid RCA lesion is 99% stenosed. ?  Prox RCA lesion is 40% stenosed. ?  Ost LAD to Prox LAD lesion is 90% stenosed. ?  Non-stenotic Prox LAD lesion was previously treated. ?  Non-stenotic Prox LAD to Mid LAD lesion was previously treated. ?  Non-stenotic Ost Cx to Prox Cx lesion was previously treated. ?  The left ventricular ejection fraction is 25-35% by visual estimate. ? ?  ? ?Home Medications ?Prior to Admission medications   ?Medication Sig Start Date End Date Taking? Authorizing Provider  ?amiodarone (PACERONE) 200 MG tablet Take 2 tablets (400 mg total) by mouth 2 (two) times daily for 6 days, THEN 1 tablet (200 mg total)  2 (two) times daily for 7 days, THEN 1 tablet (200 mg total) daily. 12/14/21 02/25/22  Reino Bellis B, NP  ?aspirin EC 81 MG tablet Take 81 mg by mouth daily.    [provider]  ?atorvastatin (LIPITOR) 80 MG tablet Take 1 tablet (80 mg total) by mouth daily. 12/14/21   Cheryln Manly, NP  ?carvedilol (COREG) 3.125 MG tablet Take 1 tablet (3.125 mg total) by mouth 2 (two) times daily with a meal. 12/14/21   Cheryln Manly, NP   ?clopidogrel (PLAVIX) 75 MG tablet TAKE ONE TABLET BY MOUTH DAILY ?Patient not taking: Reported on 03/25/2021 08/03/20   Martinique, Peter M, MD  ?dapagliflozin propanediol (FARXIGA) 10 MG TABS tablet Take 1 tablet (10 mg total) by mouth daily. 12/15/21   O'NealCassie Freer, MD  ?diclofenac Sodium (VOLTAREN) 1 % GEL Apply 2 g topically 4 (four) times daily. ?Patient taking differently: Apply 2 g topically daily as needed (For pain). 08/12/21   Simmons-Robinson, Makiera, MD  ?glipiZIDE (GLUCOTROL) 5 MG tablet Take 1 tablet (5 mg total) by mouth daily. ?Patient not taking: Reported on 03/25/2021 12/27/20   Matilde Haymaker, MD  ?HYDROcodone-acetaminophen (NORCO/VICODIN) 5-325 MG tablet Take 1-2 tablets by mouth every 8 (eight) hours as needed for moderate pain. 12/05/21   [name], DO  ?nitroGLYCERIN (NITROSTAT) 0.4 MG SL tablet Place 1 tablet (0.4 mg total) under the tongue every 5 (five) minutes as needed for chest pain. 01/05/19 12/11/21  Sande Rives E, PA-C  ?pantoprazole (PROTONIX) 40 MG tablet TAKE ONE TABLET BY MOUTH DAILY ?Patient taking differently: Take 40 mg by mouth daily. 07/08/21   [name], DO  ?sacubitril-valsartan (ENTRESTO) 24-26 MG Take 1 tablet by mouth 2 (two) times daily. 12/14/21   O'NealCassie Freer, MD  ?sertraline (ZOLOFT) 100 MG tablet Take 2 tablets (200 mg total) by mouth daily. ?Patient not taking: Reported on 03/25/2021 12/27/20   Matilde Haymaker, MD  ?sildenafil (VIAGRA) 50 MG tablet TAKE ONE TABLET BY MOUTH DAILY AS NEEDED FOR ERECTILE DYSFUNCTION ?Patient taking differently: Take 50 mg by mouth daily as needed for erectile dysfunction. 11/04/21   [name], DO  ?   ? ?Allergies    ?Ozempic (0.25 or 0.5 mg-dose) [semaglutide(0.25 or 0.'5mg'$ -dos)], Cortisone acetate, Darvocet [propoxyphene n-acetaminophen], Nsaids, Xanax [alprazolam], and Tape   ? ?Review of Systems   ?Review of Systems  ?Constitutional:   ?     Per HPI, otherwise negative  ?HENT:    ?     Per HPI, otherwise negative   ?Respiratory:    ?     Per HPI, otherwise negative  ?Cardiovascular:   ?     Per HPI, otherwise negative  ?Gastrointestinal:  Negative for vomiting.  ?Endocrine:  ?     Negative aside from HPI  ?Genitourinary:   ?     Neg aside from HPI   ?Musculoskeletal:   ?     Per HPI, otherwise negative  ?Skin: Negative.   ?Neurological:  Positive for syncope, weakness and light-headedness.  ? ?Physical Exam ?Updated Vital Signs ?SpO2 100%  ?Physical Exam ?Vitals and nursing note reviewed.  ?Constitutional:   ?   General: He is not in acute distress. ?   Appearance: He is well-developed. He is ill-appearing and diaphoretic.  ?HENT:  ?   Head: Normocephalic and atraumatic.  ?Eyes:  ?   Conjunctiva/sclera: Conjunctivae normal.  ?Cardiovascular:  ?   Rate and Rhythm: Normal rate and regular rhythm.  ?Pulmonary:  ?  Effort: Pulmonary effort is normal. No respiratory distress.  ?   Breath sounds: No stridor.  ?Abdominal:  ?   General: There is no distension.  ?Skin: ?   General: Skin is warm.  ?Neurological:  ?   Mental Status: He is alert and oriented to person, place, and time.  ? ? ?ED Results / Procedures / Treatments   ?Labs ?(all labs ordered are listed, but only abnormal results are displayed) ?Labs Reviewed  ?CBC WITH DIFFERENTIAL/PLATELET  ?COMPREHENSIVE METABOLIC PANEL  ?PROTIME-INR  ?CBG MONITORING, ED  ? ? ?EKG ?None ? ?Radiology ?No results found. ? ?Procedures ?Procedures  ? ? ?Medications Ordered in ED ?Medications  ?0.9 %  sodium chloride infusion (has no administration in time range)  ? ? ?ED Course/ Medical Decision Making/ A&P ? ?This patient with a Hx of multiple medical issues including ischemic and nonischemic cardiac disease, V. tach, recent stent placement presents to the ED for concern of syncope, weakness, diaphoresis, this involves an extensive number of treatment options, and is a complaint that carries with it a high risk of complications and morbidity.   ? ?The differential diagnosis includes  worsening heart failure, new ischemic disease, hypotension, sepsis, defibrillator failure ? ? ?Social Determinants of Health: ? ?Multiple medical issues ? ?Additional history obtained: ? ?Additional history and/or info

## 2021-12-17 NOTE — Consult Note (Addendum)
?Consult Note  ? ?Patient ID: Andrew Huerta ?MRN: [id number]; DOB: 1966/05/23  ? ?Admission date: 12/17/2021 ? ?PCP:  [name], DO ?  ?[facility] Providers ?Cardiologist:  Peter Martinique, MD  ?Electrophysiologist:  [name], MD     ? ? ?Chief Complaint:  Pre-syncope, hypotension ? ?Patient Profile:  ? ?Andrew Huerta is a 56 y.o. male with CAD, chronic back pain, HTN, HLD, DM II, Barrett's esophagus, history of adrenal tumor s/p adrenal gland resection at [facility], hypoaldosteronism s/p adrenalectomy and obstructive sleep apnea who is being seen 12/17/2021 for the evaluation of pre-syncope and hypotension. ? ?History of Present Illness:  ? ?Andrew Huerta is a 56 year old male with past medical history of CAD, chronic back pain, HTN, HLD, DM II, Barrett's esophagus, history of adrenal tumor s/p adrenal gland resection at [facility], hypoaldosteronism s/p adrenalectomy and obstructive sleep apnea.  He had a abnormal Myoview in 2015 and subsequent cardiac catheterization showed two-vessel disease, he eventually underwent stenting of mid LAD and mid left circumflex artery.  Repeat cardiac catheterization in late 2015 and July 2016 showed patent stents.  Despite so, he continued to have intermittent chest discomfort.  He was admitted in May 2020 with vague chest pain.  Echocardiogram obtained on 01/04/2019 showed EF 35 to 40%, new inferior wall abnormality.  Cardiac catheterization performed on 01/04/2019 demonstrated 99% mid RCA disease, 100% mid to distal RCA lesion was distal left to right collaterals from LAD, 40% ostial to proximal LAD lesion, widely patent proximal LAD stent, 80% proximal to mid LAD lesion treated with Onyx DES, patent ostial to proximal left circumflex stent.  In 2021, he was evaluated multiple times for syncope.  He wore a 30-day monitor which showed normal sinus rhythm, rare PACs, occasional PVCs, PVC burden 4%.  Echocardiogram showed mild improvement in his EF to 40-45%.  MRI of the brain  showed no acute finding but chronic ischemic changes.  EEG in July 2021 was unremarkable.  He was last seen by Andrew Huerta in July 2022 at which time he continued to have intermittent syncope.  It was suspected his syncope was either orthostatic versus vasovagal in the setting of not eating or drinking much and being overheated.  Carotid Doppler obtained on 03/12/2021 showed near normal carotid vessels with minimal plaque.  Antegrade flow in the bilateral vertebral artery, normal flow in bilateral subclavian arteries.  In the past he has intolerance of Imdur and could not afford Ranexa.  He also has a history of gynecomastia on spironolactone. ? ?More recently, patient presented to the emergency room on 12/11/2021 with complaints of weakness, chills, nausea, vomiting and palpitation.  On EMS arrival, he was noted to be in sustained VT with heart rate up to 160 bpm.  He was given 2 boluses of IV amiodarone and underwent defibrillation in the emergency room back to sinus rhythm.  He was subsequently admitted by cardiology service.  Echocardiogram obtained on the same day showed EF 20 to 25%, severely decreased LV function, akinesis of the LV basal mid inferoseptal wall and anteroseptal wall, RV systolic pressure 42.8 mmHg, mildly reduced RV EF, trivial MR.  He underwent left and right heart cath on that showed chronically occluded mid to distal RCA, 99% mid RCA lesion, 40% proximal RCA lesion, 90% ostial to proximal LAD lesion, EF 25 to 30%.  Cardiac output 5.2, cardiac index 2.9.  Wedge pressure 14 mmHg.  RV pressure 30/13.  Patient was seen by EP service who felt the monomorphic VT  was scar mediated and not ischemic driven, therefore it was recommended to proceed with ICD implantation first.  Aspirin and the Plavix was held for the procedure.  He underwent single lead [employer] VVI ICD for secondary prevention by Dr. Quentin Ore on 12/13/2021.  Postprocedure, it was recommended to continue to hold his dual  antiplatelet therapy for 5 days and restart on 12/19/2021 (although discharge note says both aspirin and Plavix has been resumed at discharge).  He was placed on down titrating dose of amiodarone.  Home losartan switched to low-dose Entresto.  He was also continued on Farxiga and carvedilol.  Patient was discharged with plan to proceed with PCI of LAD in 2 weeks. ? ?Post discharge, patient was initially feeling well for the first 2 days.  In the morning of 12/17/2021, he went to his family medicine doctor for appointment.  Around 9:15 AM, while sitting down talking with his family medicine doctor, he had sudden onset of nausea and flushing sensation.  He did not eat much food this morning, he only ate some chips and his medication.  He has some dry heave and appeared to be diaphoretic and weak.  Blood pressure was soft in the 80s over 60s, heart rate in the upper 50s.  Blood sugar 180.  O2 saturation 100%.  He was placed on oxygen anyway and EMS was called to transport him to the emergency room.  Total duration of the symptom only lasted about 20 minutes before completely resolved.  He did receive some IV fluid hydration in the ED.  He denied any chest discomfort or worsening dyspnea this morning.  Currently his blood pressure is back to normal in the low 100 range.  Lab work showed stable renal function and electrolyte.  BNP 192.3.  White blood cell count of 14.0.  The red blood cell count normal.  Chest x-ray shows no acute finding.  Stable position of single lead defibrillator.  EKG obtained by EMS showed sinus rhythm with minimal T wave inversion in 1 and aVL, no significant ST changes.  Cardiology service consulted for hypotension. ? ? ?Past Medical History:  ?Diagnosis Date  ? Adrenal tumor   ? a. s/p adrenal gland resection at [facility]. left side removal per patient  ? Anxiety   ? Barrett's esophagus   ? EGD - 11/27/09 - short segment of Barrett's  ? CAD (coronary artery disease)   ? a. 04/27/14 [place] s/p DES to mLAD  and DES to mLCx  ? Chronic back pain   ? upper and lower per patient  ? Chronic chest pain   ? Degenerative joint disease   ? Bilateral knees. Significant knee pain since playing football in high school. also in back per patient  ? Depression   ? Diabetes mellitus type 2, controlled ([place]) 08/29/2015  ? Diagnosed by A1c 7.6% during 08/26/15 hospital admission for chest pain  ? Gastroesophageal reflux disease with hiatal hernia   ? GERD (gastroesophageal reflux disease)   ? Hiatal hernia   ? EGD - 11/27/2009  ? Hyperlipidemia   ? Hypertension   ? Low back pain   ? Primary hyperaldosteronism ([place]) 01/22/2012  ? S/P adrenalectomy     ? PTSD (post-traumatic stress disorder)   ? Seizures ([place])   ? Sleep apnea   ? Stone, kidney   ? Syncope 04/10/2019  ? ? ?Past Surgical History:  ?Procedure Laterality Date  ? ADRENALECTOMY  10/2013  ? CARDIAC CATHETERIZATION  05/01/2014  ? Patent stents, other disease  unchanged  ? CARDIAC CATHETERIZATION  04/27/2014  ? Procedure: CORONARY STENT INTERVENTION;  Surgeon: Peter M Martinique, MD;  Location[facility] LAB;  Service: Cardiovascular;;  DES mid Cx  ?DES mid LAD  ? CARDIAC CATHETERIZATION N/A 03/22/2015  ? Procedure: Left Heart Cath and Coronary Angiography;  Surgeon: Sherren Mocha, MD;  Location: [place] CV LAB;  Service: Cardiovascular;  Laterality: N/A;  ? CORONARY ANGIOPLASTY WITH STENT PLACEMENT  04/27/2014  ? 3.0 x 16 mm Promus DES to the mid LAD and 3.5 x 28 mm Promus to the mid LCx, otherwise 20-30 percent lesions, EF 55%  ? CORONARY STENT INTERVENTION N/A 01/04/2019  ? Procedure: CORONARY STENT INTERVENTION;  Surgeon: Troy Sine, MD;  Location: [place] CV LAB;  Service: Cardiovascular;  Laterality: N/A;  ? ICD IMPLANT N/A 12/13/2021  ? Procedure: ICD IMPLANT;  Surgeon: [name], MD;  Location: [place] CV LAB;  Service: Cardiovascular;  Laterality: N/A;  ? KIDNEY STONE SURGERY  X 1  ? KNEE ARTHROPLASTY Right 1984  ? KNEE ARTHROSCOPY Right X 6  ? LEFT HEART CATH  AND CORONARY ANGIOGRAPHY N/A 01/04/2019  ? Procedure: LEFT HEART CATH AND CORONARY ANGIOGRAPHY;  Surgeon: Troy Sine, MD;  Location: [place] CV LAB;  Service: Cardiovascular;  Laterality: N/A;  ? LEFT HEA

## 2021-12-17 NOTE — ED Provider Notes (Signed)
Per discussion with Dr Vanita Panda EDP and Dr Irish Lack cardiologist, patient received as signout pending troponin level for syncopal episode.  Trop is 3, downtrending from time of discharge last week from hospital.  Cardiology team to arrange PCI of LAD next week.  Cards team interrogated [employer] with no sig arrhythmias noted.   ? ?In consultation with the cardiology team, the patient was deemed to be reasonably safe and stable for discharge with outpatient follow-up. ?  ?Wyvonnia Dusky, MD ?12/17/21 1700 ? ?

## 2021-12-17 NOTE — Progress Notes (Signed)
? ? ?  SUBJECTIVE:  ? ?CHIEF COMPLAINT / HPI:  ? ?Hospital follow-up-ventricular tachycardia: ?Patient was admitted to the hospital 12/11/2021 for tachycardia.  He received 2 IV infusions of amiodarone by EMS prior to going to the hospital.  In the ER he was shocked x1 and converted to normal sinus rhythm.  Cardiology admitted him for further management.  ICD was placed on 4/14.  Transition to amiodarone 400 mg twice daily x7 days followed by 200 mg twice daily x7 days then 200 mg daily after.  Plan to return as outpatient for PCI to the LAD in about 2 weeks. ? ?Was recommended to continue his Coreg 3.125 twice daily, losartan was changed to Entresto 24/26 mg twice daily and Farxiga 10 mg daily. ? ?As noted above his left heart cath showed 90% proximal LAD stenosis with plan to return in 2 weeks for an LAD PCI and recommendation to resume aspirin/Plavix at discharge. ? ?Today he states he feels fine.  However about 3 to 5 minutes into the appointment he started stating that he felt like he was going to throw up.  He states that he did not eat this morning but he normally does not eat.  He denies any chest pain or shortness of breath but stated that he felt the room was getting warmer.  He began to get diaphoretic and lightheaded.  EMS was called.  Blood glucose was checked and was 180.  Blood pressure was soft in the 80s over 60s.  Heart rate bradycardic in the upper 50s.  O2 sat of 100%.  He was placed on oxygen.  EMS arrived shortly thereafter performed an EKG and transported the patient to the ER.. ? ?PERTINENT  PMH / PSH: History of multiple stents and history of syncope ? ?OBJECTIVE:  ? ?BP 105/78   Pulse 67   Ht '5\' 7"'$  (1.702 m)   Wt 143 lb 8 oz (65.1 kg)   SpO2 100%   BMI 22.48 kg/m?   ? ?General: Appears unwell, diaphoretic ?Cardiac: Bradycardic, no murmurs ?Respiratory: Clear to auscultation with no respiratory distress ? ?ASSESSMENT/PLAN:  ? ?Diaphoresis  lightheadedness  nausea/vomiting: ?Present  initial for hospital follow-up after being seen for ventricular tachycardia and having defibrillator placed.  He was also started on amiodarone.  Started appointment he became diaphoretic and lightheaded as well as developed some nausea.  EMS was immediately called and the patient was checked for blood glucose level which was in the 180s.  Blood pressure was soft in the 80s over 60s.  Patient never admitted to any chest pain or shortness of breath with this but did feel lightheaded and felt flushed as if the room was getting warm.  Patient does have a significant cardiac history and plan had planned stent in the next 2 weeks.  EMS performed twelve-lead and transported the patient to the ER. ? ?[name], DO ?[place]  ? ? ? ?

## 2021-12-19 NOTE — Progress Notes (Signed)
?Cardiology Office Note:   ? ?Date:  12/30/2021  ? ?ID:  Andrew Huerta, DOB [birth year], MRN [id number] ? ?PCP:  [name], DO  ?Cardiologist:  Peter Martinique, MD  ?Electrophysiologist:  [name], MD  ? ?Referring MD: [name], DO  ? ?Chief Complaint: hospital follow-up of VT ? ?History of Present Illness:   ? ?Andrew Huerta is a 56 y.o. male with a history of CAD s/p multiple PCIs (DES to LAD and LCX in 2015, DES to LAD in 12/2018, and recent DES to LAD on 12/26/2021), VT s/p [employer] ICD on 12/13/2021, ischemic cardiomyopathy/ chronic systolic CHF with EF of 47-09% on recent Echo in 11/2021, recurrent pre-syncope/ syncope felt to be vasovagal in nature, hypertension, hyperlipidemia, type 2 diabetes mellitus, obstructive sleep apnea, GERD with Barrett's esophagus, chronic back pain, hypoaldosteronism, pheochromocytoma s/p resection in 10/2013, anxiety, depression, and PTSD who is followed by Dr. Martinique and Dr. Quentin Ore and presents today for hospital follow-up of VT. ? ?Patient had an abnormal Myoview in 2015 with subsequent cardiac catheterization in 04/2014 showing 2 vessel CAD. He underwent successful PCI with DES to mid LAD and mid LCX at that time. He continued to have intermittent chest pain following this. Repeat cardiac catheterization in late 2015 and 03/2015 showed patent stents. He was admitted in 12/2018 with vague chest pain. Echo showed LVEF of 35-40% with new inferior wall abnormality. LHC showed 99% stenosis of mid RCA followed by 100% stenosis of mid to distal RCA with left to right collaterals from the LAD and 40% stenosis of ostial to proximal LAD with widely patent proximal LAD stent followed by 80% stenosis of proximal to mid LAD, and patent LCX stent. He underwent DES to new proximal to mid LAD lesion; however, RCA was not intervened on. He had multiple episodes of syncope in 2021. Event Monitor showed normal sinus rhythm with 4% PVC burden and rare PACs but no sustained  arrhythmias. Echo showed mild improvement in EF to 40-45%. Brain MRI showed chronic ischemic changes but no acute findings and EEG was unremarkable. It was ultimately felt that syncope was vasovagal or orthostatic in nature in setting of patient not eating or drinking much or becoming overheated. ? ?Patient was recently admitted from 12/11/2021 to 12/14/2021 with complaints of weakness, chills, nausea, vomiting, and palpitations. Upon EMS arrival, he was noted to be sustained VT with heart rates up to the 160s. He was given 2 boluses of IV Amiodarone and ultimately underwent cardioversion in the ED with return of sinus rhythm. Echo showed LVEF of 20-25% with akinesis of the basal-mid inferoseptal wall, anteroseptal wall, apical septal wall, inferior wall, and anterior wall as well as hypokinesis of the entire lateral and inferolateral walls. RV was mildly enlarged with mildly reduced systolic function. He underwent R/LHC on 12/12/2021 which showed 3 vessel CAD with known CTO of RCA and 90% stenosis of ostial to proximal LAD but relatively well-compensated hemodynamics. VT was felt to be scar mediated. EP was consulted and patient had [place] ICD placed on 12/13/2021. He was discharged on PO Amiodarone load with plans for outpatient PCI of LAD. GDMT was also adjusted and his Losartan was stopped and he was switched to [facility]. ? ?He presented back to the ED on 12/17/2021 for evaluation of pre-syncope. BP reportedly was in the 80s/60s following this event but episode sounded vasovagal in nature. BP was stable in the ED so no medication changes were made. His ICD was interrogated and showed no  evidence of any concerning arrhythmias to explain symptoms.  ? ?He was seen by the [facility] on 12/23/2021 at which time her reported chronic fatigue but otherwise was doing well from a cardiac standpoint with no recurrent presyncope or syncope since ED visit. ? ?He underwent outpatient PCI with DES to LAD on 12/26/2021.   ? ?Patient presents today for follow-up. He is doing relatively well since his PCI last week.  He has had 2 short episodes of right-sided chest "aching" that occurred at rest and only lasted for a few minutes at a time but denies any of his "normal" angina.  He also reports some mild shortness of breath with exertion but none at rest.  He reports sleeping on an incline due to his reflux but no orthopnea or PND.  No lower extremity edema.  He denies any palpitations but states he is more aware of his heart beat now. No recurrent near syncope or syncope; however, he does describe some lightheadedness if he is ambulating for a while. He also has a scattered red rash on his bilateral arms and inner thighs. He first noticed the rash on his arms yesterday and on his thighs today. It does not itch. He denies any new detergents or soaps.  ? ?Past Medical History:  ?Diagnosis Date  ? Adrenal tumor   ? a. s/p adrenal gland resection at [facility]. left side removal per patient  ? Anxiety   ? Barrett's esophagus   ? EGD - 11/27/09 - short segment of Barrett's  ? CAD (coronary artery disease)   ? a. 04/27/14 [place] s/p DES to mLAD and DES to mLCx  ? Chronic back pain   ? upper and lower per patient  ? Chronic chest pain   ? Degenerative joint disease   ? Bilateral knees. Significant knee pain since playing football in high school. also in back per patient  ? Depression   ? Diabetes mellitus type 2, controlled ([place]) 08/29/2015  ? Diagnosed by A1c 7.6% during 08/26/15 hospital admission for chest pain  ? Gastroesophageal reflux disease with hiatal hernia   ? GERD (gastroesophageal reflux disease)   ? Hiatal hernia   ? EGD - 11/27/2009  ? Hyperlipidemia   ? Hypertension   ? Low back pain   ? Primary hyperaldosteronism ([place]) 01/22/2012  ? S/P adrenalectomy     ? PTSD (post-traumatic stress disorder)   ? Seizures ([place])   ? Sleep apnea   ? Stone, kidney   ? Syncope 04/10/2019  ? ? ?Past Surgical History:  ?Procedure Laterality Date  ?  ADRENALECTOMY  10/2013  ? CARDIAC CATHETERIZATION  05/01/2014  ? Patent stents, other disease unchanged  ? CARDIAC CATHETERIZATION  04/27/2014  ? Procedure: CORONARY STENT INTERVENTION;  Surgeon: Peter M Martinique, MD;  Location: [facility] CATH LAB;  Service: Cardiovascular;;  DES mid Cx  ?DES mid LAD  ? CARDIAC CATHETERIZATION N/A 03/22/2015  ? Procedure: Left Heart Cath and Coronary Angiography;  Surgeon: Sherren Mocha, MD;  Location: [place] CV LAB;  Service: Cardiovascular;  Laterality: N/A;  ? CORONARY ANGIOPLASTY WITH STENT PLACEMENT  04/27/2014  ? 3.0 x 16 mm Promus DES to the mid LAD and 3.5 x 28 mm Promus to the mid LCx, otherwise 20-30 percent lesions, EF 55%  ? CORONARY STENT INTERVENTION N/A 01/04/2019  ? Procedure: CORONARY STENT INTERVENTION;  Surgeon: Troy Sine, MD;  Location: [place] CV LAB;  Service: Cardiovascular;  Laterality: N/A;  ? CORONARY STENT INTERVENTION N/A 12/26/2021  ?  Procedure: CORONARY STENT INTERVENTION;  Surgeon: Martinique, Peter M, MD;  Location: [place] CV LAB;  Service: Cardiovascular;  Laterality: N/A;  ? ICD IMPLANT N/A 12/13/2021  ? Procedure: ICD IMPLANT;  Surgeon: [name], MD;  Location: [place] CV LAB;  Service: Cardiovascular;  Laterality: N/A;  ? KIDNEY STONE SURGERY  X 1  ? KNEE ARTHROPLASTY Right 1984  ? KNEE ARTHROSCOPY Right X 6  ? LEFT HEART CATH AND CORONARY ANGIOGRAPHY N/A 01/04/2019  ? Procedure: LEFT HEART CATH AND CORONARY ANGIOGRAPHY;  Surgeon: Troy Sine, MD;  Location: [place] CV LAB;  Service: Cardiovascular;  Laterality: N/A;  ? LEFT HEART CATHETERIZATION WITH CORONARY ANGIOGRAM N/A 04/27/2014  ? Procedure: LEFT HEART CATHETERIZATION WITH CORONARY ANGIOGRAM;  Surgeon: Peter M Martinique, MD;  Location[facility] CATH LAB;  Service: Cardiovascular;  Laterality: N/A;  ? LEFT HEART CATHETERIZATION WITH CORONARY ANGIOGRAM N/A 05/01/2014  ? Procedure: LEFT HEART CATHETERIZATION WITH CORONARY ANGIOGRAM;  Surgeon: Burnell Blanks, MD;  Location: [facility]  CATH LAB;  Service: Cardiovascular;  Laterality: N/A;  ? LITHOTRIPSY  X 2  ? RIGHT/LEFT HEART CATH AND CORONARY ANGIOGRAPHY N/A 12/12/2021  ? Procedure: RIGHT/LEFT HEART CATH AND CORONARY ANGIOGRAPHY;  Surgeon: Judieth Keens
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
## 2021-12-23 ENCOUNTER — Telehealth (HOSPITAL_COMMUNITY): Payer: Self-pay | Admitting: *Deleted

## 2021-12-23 ENCOUNTER — Encounter (HOSPITAL_COMMUNITY): Payer: Self-pay

## 2021-12-23 ENCOUNTER — Ambulatory Visit (HOSPITAL_COMMUNITY)
Admit: 2021-12-23 | Discharge: 2021-12-23 | Disposition: A | Discharge status: Home / Self Care | Payer: BC Managed Care – PPO | Attending: Physician Assistant | Admitting: Physician Assistant

## 2021-12-23 VITALS — BP 102/66 | HR 70 | Wt 141.4 lb

## 2021-12-23 DIAGNOSIS — I2510 Atherosclerotic heart disease of native coronary artery without angina pectoris: Secondary | ICD-10-CM | POA: Diagnosis not present

## 2021-12-23 DIAGNOSIS — I255 Ischemic cardiomyopathy: Secondary | ICD-10-CM | POA: Diagnosis not present

## 2021-12-23 DIAGNOSIS — I472 Ventricular tachycardia, unspecified: Secondary | ICD-10-CM | POA: Diagnosis not present

## 2021-12-23 DIAGNOSIS — E119 Type 2 diabetes mellitus without complications: Secondary | ICD-10-CM | POA: Insufficient documentation

## 2021-12-23 DIAGNOSIS — E785 Hyperlipidemia, unspecified: Secondary | ICD-10-CM | POA: Insufficient documentation

## 2021-12-23 DIAGNOSIS — I110 Hypertensive heart disease with heart failure: Secondary | ICD-10-CM | POA: Insufficient documentation

## 2021-12-23 DIAGNOSIS — Z95810 Presence of automatic (implantable) cardiac defibrillator: Secondary | ICD-10-CM | POA: Diagnosis not present

## 2021-12-23 DIAGNOSIS — Z7901 Long term (current) use of anticoagulants: Secondary | ICD-10-CM | POA: Diagnosis not present

## 2021-12-23 DIAGNOSIS — G4733 Obstructive sleep apnea (adult) (pediatric): Secondary | ICD-10-CM | POA: Insufficient documentation

## 2021-12-23 DIAGNOSIS — R55 Syncope and collapse: Secondary | ICD-10-CM | POA: Diagnosis not present

## 2021-12-23 DIAGNOSIS — Z7982 Long term (current) use of aspirin: Secondary | ICD-10-CM | POA: Diagnosis not present

## 2021-12-23 DIAGNOSIS — Z8719 Personal history of other diseases of the digestive system: Secondary | ICD-10-CM | POA: Insufficient documentation

## 2021-12-23 DIAGNOSIS — I5020 Unspecified systolic (congestive) heart failure: Secondary | ICD-10-CM

## 2021-12-23 DIAGNOSIS — Z79899 Other long term (current) drug therapy: Secondary | ICD-10-CM | POA: Insufficient documentation

## 2021-12-23 DIAGNOSIS — E782 Mixed hyperlipidemia: Secondary | ICD-10-CM

## 2021-12-23 LAB — BASIC METABOLIC PANEL
Anion gap: 11 (ref 5–15)
BUN: 13 mg/dL (ref 6–20)
CO2: 24 mmol/L (ref 22–32)
Calcium: 9.5 mg/dL (ref 8.9–10.3)
Chloride: 102 mmol/L (ref 98–111)
Creatinine, Ser: 0.97 mg/dL (ref 0.61–1.24)
GFR, Estimated: >60 mL/min (ref >60)
Glucose, Bld: 266 mg/dL — ABNORMAL HIGH (ref 70–99)
Potassium: 4.3 mmol/L (ref 3.5–5.1)
Sodium: 137 mmol/L (ref 135–145)

## 2021-12-23 LAB — BRAIN NATRIURETIC PEPTIDE: B Natriuretic Peptide: 138 pg/mL — ABNORMAL HIGH (ref 0.0–100.0)

## 2021-12-23 MED ORDER — CLOPIDOGREL BISULFATE 75 MG PO TABS: 75.0000 mg | ORAL_TABLET | Freq: Every day | ORAL | 0 refills | Status: DC

## 2021-12-23 MED ORDER — EPLERENONE 25 MG PO TABS: 25.0000 mg | ORAL_TABLET | Freq: Every day | ORAL | 6 refills | Status: DC

## 2021-12-23 NOTE — Telephone Encounter (Signed)
Call attempted to confirm HV TOC appt 12/23/21 @ 3pm. . HIPPA appropriate VM left with callback number.   ? ? ?Earnestine Leys, BSN, RN ?Heart Failure Nurse Navigator ?Secure Chat Only  ?

## 2021-12-23 NOTE — Progress Notes (Signed)
?Heart and Vascular Care Navigation ? ?12/23/2021 ? ?Andrew Huerta ?[birth year] ?[id number] ? ?Reason for Referral: Patient seen in HF TOC. ?  ?Engaged with patient face to face for initial visit for Heart and Vascular Care Coordination. ?                                                                                                  ?Assessment:   Patient is a 56 yo male who reports he has two disabled sons. He states his older son 39 yo is w/c bound and in need of double hip replacement and has a pending disability. He has a 19 yo son who has ADHD and autism. He states that the younger son also works at [employer] with patient. Patient works at [employer] and states he has short term disability pending at the moment. Patient reports he has a friend who drive him today but unsure about future needs as he has a car but is not allowed to drive for the moment.  Patient has $267 in food stamps for his younger son and states has enough food for the moment. Patient shared that his biggest concern at the moment is his housing. He and his sons live in his deceased mothers home which is in foreclosure.   Patient was advised by his lawyer not to pay the mortgage and now is behind $14,000   and has a court appearance on Wednesday. Patient is unable to pay the back mortgage and his sister would like to sell the house to get her percentage out of the house.                             ? ?HRT/VAS Care Coordination   ? ? Patients Home Cardiology Office Heart Failure Clinic  HF TOC  ? Outpatient Care Team Social Worker  ? Social Worker Name: Raquel Sarna, [place] [phone]  ? Living arrangements for the past 2 months Single Family Home  ? Lives with: Adult Children  28 yo Son and 24 yo son  ? Patient Current Librarian, academic  ? Patient Has Concern With Paying Medical Bills No  ? Does Patient Have Prescription Coverage? Yes  ? Home Assistive Devices/Equipment None  ? DME Agency NA  ? [place] Agency NA  ?  Current home services --  none  ? ?  ? ? ?Social History:                                                                             ?SDOH Screenings  ? ?Alcohol Screen: Low Risk   ? Last Alcohol Screening Score (AUDIT): 0  ?Depression (PHQ2-9): Medium Risk  ? PHQ-2 Score: 19  ?Financial Resource Strain: Medium Risk  ? Difficulty  of Paying Living Expenses: Somewhat hard  ?Food Insecurity: No Food Insecurity  ? Worried About Charity fundraiser in the Last Year: Never true  ? Ran Out of Food in the Last Year: Never true  ?Housing: High Risk  ? Last Housing Risk Score: 2  ?Physical Activity: Not on file  ?Social Connections: Not on file  ?Stress: Stress Concern Present  ? Feeling of Stress : Very much  ?Tobacco Use: Low Risk   ? Smoking Tobacco Use: Never  ? Smokeless Tobacco Use: Never  ? Passive Exposure: Not on file  ?Transportation Needs: No Transportation Needs  ? Lack of Transportation (Medical): No  ? Lack of Transportation (Non-Medical): No  ? ? ?SDOH Interventions: ?Financial Resources:    ?Pending short term disability  ?Food Insecurity:   Food stamps current  ?Housing Insecurity:   Court appearance on Wednesday for foreclosure. Patient provided a housing list for possible options in [place]  ?Transportation:      ? ? ?Follow-up plan:  CSW discussed Patient Care Fund and financial assistance with housing if needed. Patient will explore housing list provided and return call to CSW if needed. Patient verbalizes understanding of follow up and has contact information of CSW if needed. Raquel Sarna, [place], CCSW-MCS [phone] ? ? ? ? ? ?

## 2021-12-23 NOTE — Patient Instructions (Addendum)
START eplerenone '25mg'$  daily.  ? ?Your labs were drawn today, we will call your with abnormal lab results. You can view your results through [facility]. ? ?Get your labs drawn again in 1 week. We have provided you a prescription to have this done locally. ? ?Your medications (eplerone and plavix refill) have been sent to [facility] in [place].  ? ?Next appointments: ? ?Monday, May 22 @ 2pm with Pharmacy Clinic.  ? ?Wednesday, July 26 @ 2pm for your ECHO ?Then Dr. Haroldine Laws at 3pm ? ?Do the following things EVERYDAY: ?Weigh yourself in the morning before breakfast. Write it down and keep it in a log. ?Take your medicines as prescribed ?Eat low salt foods--Limit salt (sodium) to 2000 mg per day.  ?Stay as active as you can everyday ?Limit all fluids for the day to less than 2 liters ? ?If you have any questions, issues, or concerns before your next appointment please call our office at [phone], opt. 2 and leave a message for the triage nurse. ? ?

## 2021-12-24 ENCOUNTER — Telehealth: Payer: Self-pay | Admitting: *Deleted

## 2021-12-24 NOTE — Telephone Encounter (Addendum)
Coronary Stent scheduled at [facility] for: Thursday December 26, 2021 7:30 AM ?Arrival time and place: [place] Entrance A at: 5:30 AM ? ? ?No solid food after midnight prior to cath, clear liquids until 5 AM day of procedure. ? ?Medication instructions: ?-Hold: ? Glipizide and Farxiga-AM of procedure ?-Except hold medications usual morning medications can be taken with sips of water including aspirin 81 mg and Plavix 75 mg. ? ?Confirmed patient has responsible adult to drive home post procedure and be with patient first 24 hours after arriving home. ? ?Patient reports no new symptoms concerning for COVID-19/no exposure to COVID-19 in the past 10 days. ? ?Reviewed procedure instructions with patient. ? ?Patient is aware of appointment 12/25/21 with [facility] for wound check-pt reports ICD site looks "awesome", afebrile.  ?

## 2021-12-25 ENCOUNTER — Ambulatory Visit: Payer: BC Managed Care – PPO

## 2021-12-25 ENCOUNTER — Ambulatory Visit (INDEPENDENT_AMBULATORY_CARE_PROVIDER_SITE_OTHER): Payer: BC Managed Care – PPO

## 2021-12-25 DIAGNOSIS — I472 Ventricular tachycardia, unspecified: Secondary | ICD-10-CM | POA: Diagnosis not present

## 2021-12-25 NOTE — Patient Instructions (Signed)
? ?  After Your ICD ?(Implantable Cardiac Defibrillator) ? ? ? ?Monitor your defibrillator site for redness, swelling, and drainage. Call the device clinic at [phone] if you experience these symptoms or fever/chills. ? ?Your incision was closed with Steri-strips or staples:  You may showe and wash your incision with soap and water. Avoid lotions, ointments, or perfumes over your incision until it is well-healed. ? ?You may use a hot tub or a pool after your wound check appointment if the incision is completely closed. ? ?Do not lift, push or pull greater than 10 pounds with the affected arm until 6 weeks after your procedure. There are no other restrictions in arm movement after your wound check appointment. May 27 ? ?Your ICD is MRI compatible. ? ?Your ICD is designed to protect you from life threatening heart rhythms. Because of this, you may receive a shock.  ? ?1 shock with no symptoms:  Call the office during business hours. ?1 shock with symptoms (chest pain, chest pressure, dizziness, lightheadedness, shortness of breath, overall feeling unwell):  Call 911. ?If you experience 2 or more shocks in 24 hours:  Call 911. ?If you receive a shock, you should not drive.  ?[place] DMV - no driving for 6 months if you receive appropriate therapy from your ICD.  ? ?ICD Alerts:  Some alerts are vibratory and others beep. These are NOT emergencies. Please call our office to let us know. If this occurs at night or on weekends, it can wait until the next business day. Send a remote transmission. ? ?If your device is capable of reading fluid status (for heart failure), you will be offered monthly monitoring to review this with you.  ? ?Remote monitoring is used to monitor your ICD from home. This monitoring is scheduled every 91 days by our office. It allows [place] to keep an eye on the functioning of your device to ensure it is working properly. You will routinely see your Electrophysiologist annually (more often if necessary).   ?

## 2021-12-26 ENCOUNTER — Other Ambulatory Visit: Payer: Self-pay

## 2021-12-26 ENCOUNTER — Ambulatory Visit: Payer: BC Managed Care – PPO

## 2021-12-26 ENCOUNTER — Ambulatory Visit (HOSPITAL_COMMUNITY)
Admission: RE | Admit: 2021-12-26 | Discharge: 2021-12-26 | Disposition: A | Discharge status: Home / Self Care | Payer: BC Managed Care – PPO | Source: Ambulatory Visit | Attending: Cardiology | Admitting: Cardiology

## 2021-12-26 ENCOUNTER — Encounter (HOSPITAL_COMMUNITY)
Admission: RE | Disposition: A | Discharge status: Home / Self Care | Payer: Self-pay | Source: Ambulatory Visit | Attending: Cardiology

## 2021-12-26 DIAGNOSIS — E7800 Pure hypercholesterolemia, unspecified: Secondary | ICD-10-CM | POA: Diagnosis present

## 2021-12-26 DIAGNOSIS — Z955 Presence of coronary angioplasty implant and graft: Secondary | ICD-10-CM | POA: Insufficient documentation

## 2021-12-26 DIAGNOSIS — Z7984 Long term (current) use of oral hypoglycemic drugs: Secondary | ICD-10-CM | POA: Insufficient documentation

## 2021-12-26 DIAGNOSIS — I2510 Atherosclerotic heart disease of native coronary artery without angina pectoris: Secondary | ICD-10-CM | POA: Diagnosis not present

## 2021-12-26 DIAGNOSIS — E119 Type 2 diabetes mellitus without complications: Secondary | ICD-10-CM | POA: Diagnosis not present

## 2021-12-26 DIAGNOSIS — I5022 Chronic systolic (congestive) heart failure: Secondary | ICD-10-CM | POA: Diagnosis present

## 2021-12-26 DIAGNOSIS — I472 Ventricular tachycardia, unspecified: Secondary | ICD-10-CM | POA: Diagnosis not present

## 2021-12-26 DIAGNOSIS — I255 Ischemic cardiomyopathy: Secondary | ICD-10-CM | POA: Diagnosis not present

## 2021-12-26 DIAGNOSIS — Z95810 Presence of automatic (implantable) cardiac defibrillator: Secondary | ICD-10-CM | POA: Diagnosis not present

## 2021-12-26 DIAGNOSIS — G4733 Obstructive sleep apnea (adult) (pediatric): Secondary | ICD-10-CM | POA: Diagnosis not present

## 2021-12-26 DIAGNOSIS — I25118 Atherosclerotic heart disease of native coronary artery with other forms of angina pectoris: Secondary | ICD-10-CM

## 2021-12-26 DIAGNOSIS — R55 Syncope and collapse: Secondary | ICD-10-CM | POA: Insufficient documentation

## 2021-12-26 DIAGNOSIS — Z7982 Long term (current) use of aspirin: Secondary | ICD-10-CM | POA: Insufficient documentation

## 2021-12-26 DIAGNOSIS — Z79899 Other long term (current) drug therapy: Secondary | ICD-10-CM | POA: Diagnosis not present

## 2021-12-26 DIAGNOSIS — I10 Essential (primary) hypertension: Secondary | ICD-10-CM | POA: Diagnosis present

## 2021-12-26 DIAGNOSIS — I110 Hypertensive heart disease with heart failure: Secondary | ICD-10-CM | POA: Diagnosis not present

## 2021-12-26 DIAGNOSIS — Z7902 Long term (current) use of antithrombotics/antiplatelets: Secondary | ICD-10-CM | POA: Insufficient documentation

## 2021-12-26 HISTORY — PX: CORONARY STENT INTERVENTION: CATH118234

## 2021-12-26 LAB — CUP PACEART INCLINIC DEVICE CHECK
Brady Statistic RV Percent Paced: 1 % — CL
Date Time Interrogation Session: 20230426000000
HighPow Impedance: 69 Ohm
Implantable Lead Implant Date: 20230414
Implantable Lead Location: 753860
Implantable Lead Model: 672
Implantable Lead Serial Number: 182871
Implantable Pulse Generator Implant Date: 20230414
Lead Channel Impedance Value: 643 Ohm
Lead Channel Pacing Threshold Amplitude: 0.7 V
Lead Channel Pacing Threshold Pulse Width: 0.4 ms
Lead Channel Sensing Intrinsic Amplitude: 22 mV
Lead Channel Setting Pacing Amplitude: 3.5 V
Lead Channel Setting Pacing Pulse Width: 0.4 ms
Lead Channel Setting Sensing Sensitivity: 0.5 mV
Pulse Gen Serial Number: 310848

## 2021-12-26 LAB — POCT ACTIVATED CLOTTING TIME
Activated Clotting Time: 348 seconds
Activated Clotting Time: 329 seconds

## 2021-12-26 LAB — GLUCOSE, CAPILLARY: Glucose-Capillary: 185 mg/dL — ABNORMAL HIGH (ref 70–99)

## 2021-12-26 SURGERY — CORONARY STENT INTERVENTION
Anesthesia: LOCAL

## 2021-12-26 MED ORDER — SODIUM CHLORIDE 0.9 % IV SOLN: INTRAVENOUS | Status: DC

## 2021-12-26 MED ORDER — CLOPIDOGREL BISULFATE 75 MG PO TABS: 75.0000 mg | ORAL_TABLET | ORAL | Status: DC

## 2021-12-26 MED ORDER — LIDOCAINE HCL (PF) 1 % IJ SOLN
INTRAMUSCULAR | Status: DC | PRN
  Administered 2021-12-26: 2 mL

## 2021-12-26 MED ORDER — ONDANSETRON HCL 4 MG/2ML IJ SOLN: 4.0000 mg | Freq: Four times a day (QID) | INTRAMUSCULAR | Status: DC | PRN

## 2021-12-26 MED ORDER — SODIUM CHLORIDE 0.9% FLUSH: 3.0000 mL | Freq: Two times a day (BID) | INTRAVENOUS | Status: DC

## 2021-12-26 MED ORDER — LIDOCAINE HCL (PF) 1 % IJ SOLN
INTRAMUSCULAR | Status: AC
  Filled 2021-12-26: qty 30

## 2021-12-26 MED ORDER — FENTANYL CITRATE (PF) 100 MCG/2ML IJ SOLN
INTRAMUSCULAR | Status: AC
Start: 2021-12-26 — End: ?
  Filled 2021-12-26: qty 2

## 2021-12-26 MED ORDER — ASPIRIN 81 MG PO CHEW: 81.0000 mg | CHEWABLE_TABLET | ORAL | Status: DC

## 2021-12-26 MED ORDER — MIDAZOLAM HCL 2 MG/2ML IJ SOLN
INTRAMUSCULAR | Status: DC | PRN
  Administered 2021-12-26: 2 mg via INTRAVENOUS
  Administered 2021-12-26: 1 mg via INTRAVENOUS

## 2021-12-26 MED ORDER — SODIUM CHLORIDE 0.9 % IV SOLN: 250.0000 mL | INTRAVENOUS | Status: DC | PRN

## 2021-12-26 MED ORDER — SODIUM CHLORIDE 0.9% FLUSH: 3.0000 mL | INTRAVENOUS | Status: DC | PRN

## 2021-12-26 MED ORDER — ACETAMINOPHEN 325 MG PO TABS: 650.0000 mg | ORAL_TABLET | ORAL | Status: DC | PRN

## 2021-12-26 MED ORDER — VIPERSLIDE LUBRICANT OPTIME: TOPICAL | Status: DC | PRN

## 2021-12-26 MED ORDER — NITROGLYCERIN 0.4 MG SL SUBL: 0.4000 mg | SUBLINGUAL_TABLET | SUBLINGUAL | 2 refills | Status: AC | PRN

## 2021-12-26 MED ORDER — VERAPAMIL HCL 2.5 MG/ML IV SOLN
INTRAVENOUS | Status: AC
  Filled 2021-12-26: qty 2

## 2021-12-26 MED ORDER — HEPARIN SODIUM (PORCINE) 1000 UNIT/ML IJ SOLN
INTRAMUSCULAR | Status: AC
  Filled 2021-12-26: qty 10

## 2021-12-26 MED ORDER — HEPARIN (PORCINE) IN NACL 1000-0.9 UT/500ML-% IV SOLN
INTRAVENOUS | Status: DC | PRN
  Administered 2021-12-26 (×2): 500 mL

## 2021-12-26 MED ORDER — SODIUM CHLORIDE 0.9 % WEIGHT BASED INFUSION: 1.0000 mL/kg/h | INTRAVENOUS | Status: DC

## 2021-12-26 MED ORDER — VERAPAMIL HCL 2.5 MG/ML IV SOLN
INTRAVENOUS | Status: DC | PRN
  Administered 2021-12-26: 10 mL via INTRA_ARTERIAL

## 2021-12-26 MED ORDER — NITROGLYCERIN 1 MG/10 ML FOR IR/CATH LAB
INTRA_ARTERIAL | Status: AC
  Filled 2021-12-26: qty 10

## 2021-12-26 MED ORDER — HEPARIN (PORCINE) IN NACL 1000-0.9 UT/500ML-% IV SOLN
INTRAVENOUS | Status: AC
  Filled 2021-12-26: qty 1000

## 2021-12-26 MED ORDER — MIDAZOLAM HCL 2 MG/2ML IJ SOLN
INTRAMUSCULAR | Status: AC
  Filled 2021-12-26: qty 2

## 2021-12-26 MED ORDER — HEPARIN SODIUM (PORCINE) 1000 UNIT/ML IJ SOLN
INTRAMUSCULAR | Status: DC | PRN
  Administered 2021-12-26: 7000 [IU] via INTRAVENOUS

## 2021-12-26 MED ORDER — FENTANYL CITRATE (PF) 100 MCG/2ML IJ SOLN
INTRAMUSCULAR | Status: DC | PRN
  Administered 2021-12-26 (×2): 25 ug via INTRAVENOUS

## 2021-12-26 SURGICAL SUPPLY — 21 items
BALL SAPPHIRE NC24 3.25X18 (BALLOONS) ×2
BALLN SAPPHIRE 2.5X15 (BALLOONS) ×2
BALLOON SAPPHIRE 2.5X15 (BALLOONS) IMPLANT
BALLOON SAPPHIRE NC24 3.25X18 (BALLOONS) IMPLANT
CATH DRAGONFLY OPTIS 2.7FR (CATHETERS) ×1 IMPLANT
CATH VISTA GUIDE 6FR XBLAD3.5 (CATHETERS) ×1 IMPLANT
CROWN DIAMONDBACK CLASSIC 1.25 (BURR) ×1 IMPLANT
DEVICE RAD COMP TR BAND LRG (VASCULAR PRODUCTS) ×1 IMPLANT
GLIDESHEATH SLEND SS 6F .021 (SHEATH) ×1 IMPLANT
GUIDEWIRE INQWIRE 1.5J.035X260 (WIRE) IMPLANT
INQWIRE 1.5J .035X260CM (WIRE) ×2
KIT ENCORE 26 ADVANTAGE (KITS) ×1 IMPLANT
KIT HEART LEFT (KITS) ×2 IMPLANT
LUBRICANT VIPERSLIDE CORONARY (MISCELLANEOUS) ×1 IMPLANT
PACK CARDIAC CATHETERIZATION (CUSTOM PROCEDURE TRAY) ×2 IMPLANT
SHEATH PROBE COVER 6X72 (BAG) ×1 IMPLANT
STENT ONYX FRONTIER 3.0X30 (Permanent Stent) ×1 IMPLANT
TRANSDUCER W/STOPCOCK (MISCELLANEOUS) ×2 IMPLANT
TUBING CIL FLEX 10 FLL-RA (TUBING) ×2 IMPLANT
WIRE ASAHI PROWATER 180CM (WIRE) ×1 IMPLANT
WIRE VIPERWIRE COR FLEX .012 (WIRE) ×1 IMPLANT

## 2021-12-26 NOTE — Interval H&P Note (Signed)
History and Physical Interval Note: ? ?12/26/2021 ?7:11 AM ? ?[name]  has presented today for surgery, with the diagnosis of cad.  The various methods of treatment have been discussed with the patient and family. After consideration of risks, benefits and other options for treatment, the patient has consented to  Procedure(s): ?CORONARY STENT INTERVENTION (N/A) as a surgical intervention.  The patient's history has been reviewed, patient examined, no change in status, stable for surgery.  I have reviewed the patient's chart and labs.  Questions were answered to the patient's satisfaction.   ?Cath Lab Visit (complete for each Cath Lab visit) ? ?Clinical Evaluation Leading to the Procedure:  ? ?ACS: No. ? ?Non-ACS:   ? ?Anginal Classification: CCS II ? ?Anti-ischemic medical therapy: Minimal Therapy (1 class of medications) ? ?Non-Invasive Test Results: No non-invasive testing performed ? ?Prior CABG: No previous CABG ? ? ? ? ? ? ? ?Collier [facility] ?12/26/2021 ?7:12 AM ? ? ? ?

## 2021-12-26 NOTE — Progress Notes (Signed)
Wound check appointment. Steri-strips removed. Wound without redness or edema. Incision edges approximated, wound well healed. Normal device function. Thresholds, sensing, and impedances consistent with implant measurements. Device programmed at 3.5V for extra safety margin until 3 month visit. Histogram distribution appropriate for patient and level of activity. No ventricular arrhythmias noted. Patient educated about wound care, arm mobility, lifting restrictions, shock plan. Enrolled in remote monitoring with next transmission scheduled 03/17/22. ROV follow up with Dr. Quentin Ore 03/17/22. Of note patient is scheduled for PCI/Stent 12/26/21 ?

## 2021-12-26 NOTE — Discharge Summary (Signed)
?Discharge Summary for Same Day PCI  ? ?Patient ID: Andrew Huerta ?MRN: [id number]; DOB: 06/16/1966 ? ?Admit date: 12/26/2021 ?Discharge date: 12/26/2021 ? ?Primary Care Provider: [name], DO  ?Primary Cardiologist: Peter Martinique, MD  ?Primary Electrophysiologist:  [name], MD  ? ?Discharge Diagnoses  ?  ?Principal Problem: ?  CAD ?Active Problems: ?  Essential hypertension ?  Hypercholesterolemia ?  Ventricular tachycardia ([place]) ?  Chronic systolic CHF (congestive heart failure) ([place]) ? ?Diagnostic Studies/Procedures  ?  ?Cardiac Catheterization 12/26/2021: ? ?  Ost LAD to Prox LAD lesion is 90% stenosed. ?  A drug-eluting stent was successfully placed using a STENT ONYX FRONTIER 3.0X30. ?  Post intervention, there is a 0% residual stenosis. ?  ?Successful PCI of the proximal to mid LAD with OCT guidance, orbital atherectomy and DES x 1 overlapping with stent in the mid LAD. ?  ?Plan: anticipate same day DC. DAPT with ASA and Plavix indefinitely given multiple stents.  ? ?Diagnostic ?Dominance: Right ?Intervention ? ? ?_____________ ?  ?History of Present Illness   ?  ?Andrew Huerta is a 56 y.o. male with history of CAD, ICM, HTN, HLD, DM II, Barrett's esophagus, hx adrenal tumor and hyperaldosteronism s/p adrenal gland resection at [facility], OSA.  ?  ?He had PCI/stenting to m LAD and m Lcx in 2015. Had EF 35-40% on echo in May 2020. Cardiac cath 12/2018 with 99% mid RCA, 100% mid to distal RCA with left to right collaterals from LAD, 80% p to m LAD treated with DES, patent left Cx stent. Later had recurrent syncopal episodes. Wore event monitor in 2021 with no arrhythmias to explain symptoms.  Syncope felt to be likely vasovagal or orthostatic.  ?  ?Presented 12/11/2021 via EMS with sustained VT resulting in cardiogenic shock. BC and procalcitonin negative. Given 2 boluses IV amiodarone and underwent urgent cardioversion. Echo EF 20-25%, akinesis LV basal mid inferoseptal wall and anteroseptal wall,  mildly reduced RV.  ?  ?R/LHC, 99% mid RCA, 100% mid to distal RCA, 90% ostial to p LAD, RA 10, PCWP 14 mmHg, Fick CO 5.2/CI 2.9, PAPi 1.3. Ep consulted, monomorphic VT felt to be scar mediated. Underwent single-lead [employer] ICD by Dr. Quentin Ore 12/13/21. Initiated on po amiodarone.  ?  ?Returned to ED on 12/17/21 with hypotension, nausea and diaphoresis. He was given IV hydration. No VT on device check. Felt to have a vasovagal episode. Felt to be stable for discharge home. ?  ?Scheduled to undergo PCI to LAD with Dr. Martinique on 12/26/21. ?  ?Hospital Course  ?   ?The patient underwent cardiac cath as noted above with successful PCI of p/mLAD with OCT guidance, orbital atherectomy and DESx1 overlapping prior stenting. Plan for DAPT with ASA/plavix indefinitely given multiple stents. The patient was seen by cardiac rehab while in short stay. There were no observed complications post cath. Radial cath site was re-evaluated prior to discharge and found to be stable without any complications. Instructions/precautions regarding cath site care were given prior to discharge. ? ?Andrew Huerta was seen by Dr. Martinique and determined stable for discharge home. Follow up with our office has been arranged. Medications are listed below. Pertinent changes include N/a. ? ?_____________ ? ?Cath/PCI Registry Performance & Quality Measures: ?Aspirin prescribed? - Yes ?ADP Receptor Inhibitor (Plavix/Clopidogrel, Brilinta/Ticagrelor or Effient/Prasugrel) prescribed (includes medically managed patients)? - Yes ?High Intensity Statin (Lipitor 40-'80mg'$  or Crestor 20-'40mg'$ ) prescribed? - Yes ?For EF <40%, was ACEI/ARB prescribed? - Yes ?For EF <  40%, Aldosterone Antagonist (Spironolactone or Eplerenone) prescribed? - No - Reason:  clinic is working to get him started on eplerenone  ?Cardiac Rehab Phase II ordered (Included Medically managed Patients)? - Yes ? ?_____________ ? ? ?Discharge Vitals ?Blood pressure 100/78, pulse 71,  temperature 97.7 ?F (36.5 ?C), temperature source Oral, resp. rate 14, height '5\' 7"'$  (1.702 m), weight 63.5 kg, SpO2 97 %.  [name] Weights  ? 12/26/21 0548  ?Weight: 63.5 kg  ? ? ?Last Labs & Radiologic Studies  ?  ?CBC ?No results for input(s): WBC, NEUTROABS, HGB, HCT, MCV, PLT in the last 72 hours. ?Basic Metabolic Panel ?Recent Labs  ?  12/23/21 ?1618  ?NA 137  ?K 4.3  ?CL 102  ?CO2 24  ?GLUCOSE 266*  ?BUN 13  ?CREATININE 0.97  ?CALCIUM 9.5  ? ?Liver Function Tests ?No results for input(s): AST, ALT, ALKPHOS, BILITOT, PROT, ALBUMIN in the last 72 hours. ?No results for input(s): LIPASE, AMYLASE in the last 72 hours. ?High Sensitivity Troponin:   ?Recent Labs  ?Lab 12/11/21 ?1347 12/11/21 ?1552 12/17/21 ?1532 12/17/21 ?1630  ?TROPONINIHS 196* 265* 65* 63*  ?  ?BNP ?Invalid input(s): POCBNP ?D-Dimer ?No results for input(s): DDIMER in the last 72 hours. ?Hemoglobin A1C ?No results for input(s): HGBA1C in the last 72 hours. ?Fasting Lipid Panel ?No results for input(s): CHOL, HDL, LDLCALC, TRIG, CHOLHDL, LDLDIRECT in the last 72 hours. ?Thyroid Function Tests ?No results for input(s): TSH, T4TOTAL, T3FREE, THYROIDAB in the last 72 hours. ? ?Invalid input(s): FREET3 ?_____________  ?DG Chest 2 View ? ?Result Date: 12/14/2021 ?CLINICAL DATA:  Tachycardia, pacemaker follow-up EXAM: CHEST - 2 VIEW COMPARISON:  December 11, 2021 FINDINGS: The heart size and mediastinal contours are within normal limits. Single lead cardiac pacemaker is identified with the tip projected over the right ventricle. both lungs are clear. There is no pneumothorax. The visualized skeletal structures are unremarkable. IMPRESSION: No active cardiopulmonary disease. Single lead cardiac pacemaker is identified with the tip projected over the right ventricle. Electronically Signed   By: Abelardo Diesel M.D.   On: 12/14/2021 08:31  ? ?CARDIAC CATHETERIZATION ? ?Addendum Date: 12/26/2021   ?  Ost LAD to Prox LAD lesion is 90% stenosed.   A drug-eluting stent  was successfully placed using a STENT ONYX FRONTIER 3.0X30.   Post intervention, there is a 0% residual stenosis. Successful PCI of the proximal to mid LAD with OCT guidance, orbital atherectomy and DES x 1 overlapping with stent in the mid LAD. Plan: anticipate same day DC. DAPT with ASA and Plavix indefinitely given multiple stents.  ? ?Result Date: 12/26/2021 ?  Ost LAD to Prox LAD lesion is 90% stenosed.   A drug-eluting stent was successfully placed using a STENT ONYX FRONTIER 3.0X30.   Post intervention, there is a 0% residual stenosis. Successful PCI of the proximal to mid LAD with OCT guidance, orbital atherectomy and DES x 1 overlapping with stent in the mid LAD. Plan: anticipate same day DC. DAPT with ASA and Plavix indefinitely given multiple stents.  ? ?CARDIAC CATHETERIZATION ? ?Result Date: 12/12/2021 ?  Mid LAD lesion is 30% stenosed.   Mid RCA to Dist RCA lesion is 100% stenosed.   Mid RCA lesion is 99% stenosed.   Prox RCA lesion is 40% stenosed.   Ost LAD to Prox LAD lesion is 90% stenosed.   Non-stenotic Prox LAD lesion was previously treated.   Non-stenotic Prox LAD to Mid LAD lesion was previously treated.   Non-stenotic Colon Flattery  Cx to Prox Cx lesion was previously treated.   The left ventricular ejection fraction is 25-35% by visual estimate. Findings: Ao = 110/68 (86) LV = 115/16 RA = 10 RV = 30/13 PA = 28/15 (21) PCW = 14 Fick cardiac output/index = 5.2/2.9 PVR = 1.3 WU FA sat = 97% PA sat = 69%, 70% PAPi = 1.3 Assessment: 1. 3v CAD with 90% lesion in proximal LAD prior to previous stent otherwise stable CAD with patency of previous LAD & LCx stents 2. iCM EF 30-35% 3. Relatively well-compensated hemodynamics with evidence of RV dysfunction Plan/Discussion: Suspect VT is scar-mediated and will need ICD. However, he also has significant progression of CAD in proximal LAD and will need PCI/atherectomy of this area. We will coordinate timing of ICD and PCI procedures to minimize risk. Glori Bickers,  MD 9:20 AM  ? ?DG Chest Port 1 View ? ?Result Date: 12/17/2021 ?CLINICAL DATA:  Defibrillator placement. EXAM: PORTABLE CHEST 1 VIEW COMPARISON:  December 14, 2021. FINDINGS: The heart size and mediastinal cont

## 2021-12-26 NOTE — Progress Notes (Signed)
CARDIAC REHAB PHASE I  ? ?Discussed with pt and friend stent, Plavix imortance, restrictions, diet (low sodium, DM, increased veggies), exercise, and CRPII. Pt receptive. Sts he has had CP for years but cannot take NTG due to low BP. Will refer to [facility]. Pts friend is very supportive but ultimately pt manages himself independently.  ?[id number] ? ?Andrew Huerta CES, ACSM ?12/26/2021 ?12:09 PM ? ? ? ? ?

## 2021-12-27 ENCOUNTER — Encounter (HOSPITAL_COMMUNITY): Payer: Self-pay | Admitting: Cardiology

## 2021-12-27 MED FILL — Nitroglycerin IV Soln 100 MCG/ML in D5W: INTRA_ARTERIAL | Qty: 10 | Status: AC

## 2021-12-30 ENCOUNTER — Ambulatory Visit (INDEPENDENT_AMBULATORY_CARE_PROVIDER_SITE_OTHER): Payer: BC Managed Care – PPO | Admitting: Student

## 2021-12-30 ENCOUNTER — Encounter: Payer: Self-pay | Admitting: Student

## 2021-12-30 ENCOUNTER — Ambulatory Visit (HOSPITAL_COMMUNITY): Payer: Self-pay | Admitting: Clinical

## 2021-12-30 VITALS — BP 118/74 | HR 55 | Ht 67.0 in | Wt 141.0 lb

## 2021-12-30 DIAGNOSIS — Z79899 Other long term (current) drug therapy: Secondary | ICD-10-CM

## 2021-12-30 DIAGNOSIS — I472 Ventricular tachycardia, unspecified: Secondary | ICD-10-CM

## 2021-12-30 DIAGNOSIS — I5022 Chronic systolic (congestive) heart failure: Secondary | ICD-10-CM | POA: Diagnosis not present

## 2021-12-30 DIAGNOSIS — R55 Syncope and collapse: Secondary | ICD-10-CM

## 2021-12-30 DIAGNOSIS — I255 Ischemic cardiomyopathy: Secondary | ICD-10-CM

## 2021-12-30 DIAGNOSIS — E118 Type 2 diabetes mellitus with unspecified complications: Secondary | ICD-10-CM

## 2021-12-30 DIAGNOSIS — R21 Rash and other nonspecific skin eruption: Secondary | ICD-10-CM

## 2021-12-30 DIAGNOSIS — I10 Essential (primary) hypertension: Secondary | ICD-10-CM

## 2021-12-30 DIAGNOSIS — I2510 Atherosclerotic heart disease of native coronary artery without angina pectoris: Secondary | ICD-10-CM | POA: Diagnosis not present

## 2021-12-30 DIAGNOSIS — E785 Hyperlipidemia, unspecified: Secondary | ICD-10-CM

## 2021-12-30 NOTE — Patient Instructions (Signed)
Medication Instructions:  ?Your physician recommends that you continue on your current medications as directed. Please refer to the Current Medication list given to you today.  ?*If you need a refill on your cardiac medications before your next appointment, please call your pharmacy* ? ? ?Lab Work: ?Your physician recommends that you return for lab work in:  ?TODAY: CMET, TSH ?In 2 months:  Lipids, LFTs ?If you have labs (blood work) drawn today and your tests are completely normal, you will receive your results only by: ?MyChart Message (if you have MyChart) OR ?A paper copy in the mail ?If you have any lab test that is abnormal or we need to change your treatment, we will call you to review the results. ? ? ?Testing/Procedures: ?Your physician has requested that you have an echocardiogram. Echocardiography is a painless test that uses sound waves to create images of your heart. It provides your doctor with information about the size and shape of your heart and how well your heart?s chambers and valves are working. This procedure takes approximately one hour. There are no restrictions for this procedure. In 2 months ? ?Follow-Up: ?At [facility], you and your health needs are our priority.  As part of our continuing mission to provide you with exceptional heart care, we have created designated Provider Care Teams.  These Care Teams include your primary Cardiologist (physician) and Advanced Practice Providers (APPs -  Physician Assistants and Nurse Practitioners) who all work together to provide you with the care you need, when you need it. ? ?We recommend signing up for the patient portal called "MyChart".  Sign up information is provided on this After Visit Summary.  MyChart is used to connect with patients for Virtual Visits (Telemedicine).  Patients are able to view lab/test results, encounter notes, upcoming appointments, etc.  Non-urgent messages can be sent to your provider as well.   ?To learn more about  what you can do with MyChart, go to [email or web address].   ? ?Your next appointment:   ?5 month(s) ? ?The format for your next appointment:   ?In Person ? ?Provider:   ?Peter Martinique, MD   ? ? ?Other Instructions ? ? ?Important Information About [place] ? ? ? ? ?  ?

## 2021-12-31 LAB — TSH+T4F+T3FREE
Free T4: 1.7 ng/dL (ref 0.82–1.77)
T3, Free: 1.8 pg/mL — ABNORMAL LOW (ref 2.0–4.4)
TSH: 0.86 u[IU]/mL (ref 0.450–4.500)

## 2021-12-31 LAB — COMPREHENSIVE METABOLIC PANEL
ALT: 45 IU/L — ABNORMAL HIGH (ref 0–44)
AST: 33 IU/L (ref 0–40)
Albumin/Globulin Ratio: 1.8 (ref 1.2–2.2)
Albumin: 5.1 g/dL — ABNORMAL HIGH (ref 3.8–4.9)
Alkaline Phosphatase: 94 IU/L (ref 44–121)
BUN/Creatinine Ratio: 17 (ref 9–20)
BUN: 15 mg/dL (ref 6–24)
Bilirubin Total: 0.5 mg/dL (ref 0.0–1.2)
CO2: 22 mmol/L (ref 20–29)
Calcium: 9.9 mg/dL (ref 8.7–10.2)
Chloride: 99 mmol/L (ref 96–106)
Creatinine, Ser: 0.88 mg/dL (ref 0.76–1.27)
Globulin, Total: 2.8 g/dL (ref 1.5–4.5)
Glucose: 167 mg/dL — ABNORMAL HIGH (ref 70–99)
Potassium: 4.6 mmol/L (ref 3.5–5.2)
Sodium: 140 mmol/L (ref 134–144)
Total Protein: 7.9 g/dL (ref 6.0–8.5)
eGFR: 102 mL/min/{1.73_m2} (ref >59)

## 2022-01-01 ENCOUNTER — Telehealth: Payer: Self-pay | Admitting: Cardiology

## 2022-01-01 ENCOUNTER — Other Ambulatory Visit: Payer: Self-pay

## 2022-01-01 DIAGNOSIS — Z79899 Other long term (current) drug therapy: Secondary | ICD-10-CM

## 2022-01-01 DIAGNOSIS — E785 Hyperlipidemia, unspecified: Secondary | ICD-10-CM

## 2022-01-01 DIAGNOSIS — I10 Essential (primary) hypertension: Secondary | ICD-10-CM

## 2022-01-01 DIAGNOSIS — E1169 Type 2 diabetes mellitus with other specified complication: Secondary | ICD-10-CM

## 2022-01-01 NOTE — Telephone Encounter (Signed)
01/01/2022  ?  ?Disability forms received from [place] - forms put in [place] Box to be reviewed.  ?

## 2022-01-02 ENCOUNTER — Other Ambulatory Visit: Payer: Self-pay | Admitting: Family Medicine

## 2022-01-02 DIAGNOSIS — G894 Chronic pain syndrome: Secondary | ICD-10-CM

## 2022-01-02 MED ORDER — HYDROCODONE-ACETAMINOPHEN 5-325 MG PO TABS: 1.0000 | ORAL_TABLET | Freq: Three times a day (TID) | ORAL | 0 refills | Status: DC | PRN

## 2022-01-07 ENCOUNTER — Other Ambulatory Visit: Payer: Self-pay

## 2022-01-07 DIAGNOSIS — Z0279 Encounter for issue of other medical certificate: Secondary | ICD-10-CM

## 2022-01-07 DIAGNOSIS — F332 Major depressive disorder, recurrent severe without psychotic features: Secondary | ICD-10-CM

## 2022-01-07 MED ORDER — SERTRALINE HCL 100 MG PO TABS: 200.0000 mg | ORAL_TABLET | Freq: Every day | ORAL | 3 refills | Status: DC

## 2022-01-07 NOTE — Telephone Encounter (Signed)
Spoke with patient, forms completed, patient is coming into office to complete Release, Billing Form, and $29 forms fee when he is able to. Once completed we can fax forms, and give patient a copy.  ?

## 2022-01-07 NOTE — Telephone Encounter (Signed)
01/07/22  ?Patient came into office, completed release , billing form, and $29 forms fee.  ?Patient was given copy of forms, and forms were faxed to [place] / [employer].  ?

## 2022-01-07 NOTE — Telephone Encounter (Signed)
01/07/22  ? ?Email sent to billing ?

## 2022-01-13 ENCOUNTER — Telehealth: Payer: Self-pay | Admitting: Cardiology

## 2022-01-13 ENCOUNTER — Other Ambulatory Visit: Payer: Self-pay | Admitting: Cardiovascular Disease

## 2022-01-13 NOTE — Telephone Encounter (Signed)
Patient called to check on his son Andrew Huerta's FMLA paperwork. Hartley Urton 11/05/96. Please call Madhav Mohon for update [phone]. ?

## 2022-01-13 NOTE — Telephone Encounter (Signed)
Pt calling to f/u on FMLA paperwork. Please advise ?

## 2022-01-14 ENCOUNTER — Telehealth: Payer: Self-pay | Admitting: Cardiology

## 2022-01-14 NOTE — Telephone Encounter (Signed)
Spoke to patient advised Dr.Jordan is out of office this week.I will call back when paper work is ready. ?

## 2022-01-14 NOTE — Telephone Encounter (Signed)
Patient called to check the status of the FMLA paperwork  ?

## 2022-01-14 NOTE — Telephone Encounter (Signed)
Spoke to patient, patient states his FMLA was just approved today and his premium for insurance was paid late and will not be able to get his prescription filled for up to 48 hours. He took last dose of faxiga today.  ? ?Samples provided for patient.  Patient aware and will come pick up.  ?

## 2022-01-14 NOTE — Telephone Encounter (Signed)
See previous 01/14/22 telephone note. ?

## 2022-01-14 NOTE — Telephone Encounter (Signed)
Pt c/o medication issue: ? ?1. Name of Medication: dapagliflozin propanediol (FARXIGA) 10 MG TABS tablet ? ?2. How are you currently taking this medication (dosage and times per day)? Take 1 tablet by mouth once daily ? ?3. Are you having a reaction (difficulty breathing--STAT)? no ? ?4. What is your medication issue?patient is having issues with his insurance and wants to know if it will be okay if he goes w/o medication for a couple of days. Please advise ? ?

## 2022-01-16 ENCOUNTER — Other Ambulatory Visit (HOSPITAL_COMMUNITY): Payer: Self-pay | Admitting: Physician Assistant

## 2022-01-17 NOTE — Progress Notes (Incomplete)
***In Progress***    Advanced Heart Failure Clinic Note   Referring Physician: Dr. Johney Frame Primary Care: Dr. Vanessa Quitman Primary Cardiologist: Dr. Martinique   HPI:  Referred to clinic by Dr. Johney Frame with Cardiology for heart failure consultation. 56 y.o. male with history of CAD, ICM, HTN, HLD, DM II, Barrett's esophagus, hx adrenal tumor and hyperaldosteronism s/p adrenal gland resection at [place], and OSA.    He had PCI/stenting to m LAD and m Lcx in 2015. Had EF 35-40% on echo in May 2020. Cardiac cath 12/2018 with 99% mid RCA, 100% mid to distal RCA with left to right collaterals from LAD, 80% p to m LAD treated with DES, patent left Cx stent. Later had recurrent syncopal episodes. Wore event monitor in 2021 with no arrhythmias to explain symptoms. Syncope felt to be likely vasovagal or orthostatic.    Presented 12/11/2021 via EMS with sustained VT resulting in cardiogenic shock. BCx and procalcitonin negative. Given 2 boluses IV amiodarone and underwent urgent cardioversion. Echo EF 20-25%, akinesis LV basal mid inferoseptal wall and anteroseptal wall, mildly reduced RV.    R/LHC, 99% mid RCA, 100% mid to distal RCA, 90% ostial to p LAD, RA 10, PCWP 14 mmHg, Fick CO 5.2/CI 2.9, PAPi 1.3. EP consulted, monomorphic VT felt to be scar mediated. Underwent single-lead [employer] ICD by Dr. Quentin Ore 12/13/2021. Initiated on po amiodarone.    Returned to ED on 12/17/2021 with hypotension, nausea and diaphoresis. He was given IV hydration. No VT on device check. Felt to have a vasovagal episode. Felt to be stable for discharge home.    He had follow-up post hospitalization on 12/23/2021 with HF TOC clinic. He denied any recurrent presyncope or syncope since his ED visit, denied shortness of breath, but reported chronic fatigue. No orthopnea, PND or lower extremity edema. He had been compliant with medications. He was started on eplerenone 25 mg daily as he had gynecomastia with spironolactone.    Underwent PCI to LAD with overlapping DES to the mLAD by Dr. Martinique on 12/26/2021.  He had cardiology follow-up on 12/30/2021 where he was enrolled in cardiac rehab. No medications were titrated due to reports of low BP at home. He also had a new rash, that could potentially be from one of his medications.    He works in Geologist, engineering at [employer]. Has been off work since his admission in April. He has been approved for [employer]. Of note, his insurance premium was paid late so he was unable to fill his [place] earlier this week. The patient was given samples. Takes care of his 40 y.o. son with autism and 57 y.o. son who is disabled. He has been staying in his mother's home after she passed away. Patient is worried he may lose the home at upcoming court date and would be homeless.   Today he returns to HF clinic for pharmacist medication titration.   Overall feeling ***. Dizziness, lightheadedness, fatigue:  Chest pain or palpitations:  How is your breathing?: *** SOB: Able to complete all ADLs. Activity level ***  Weight at home pounds. Takes furosemide/torsemide/bumex *** mg *** daily.  LEE PND/Orthopnea  Appetite *** Low-salt diet:   Physical Exam Cost/affordability of meds   HF Medications: Carvedilol 3.125 mg BID Entresto 24/26 mg BID Farxiga 10 mg daily Eplerenone 25 mg daily  Has the patient been experiencing any side effects to the medications prescribed?  {YES NO:22349}  Does the patient have any problems obtaining medications due to transportation or finances?   {  YES [id number]}  Understanding of regimen: {excellent/good/fair/poor:19665} Understanding of indications: {excellent/good/fair/poor:19665} Potential of compliance: {excellent/good/fair/poor:19665} Patient understands to avoid NSAIDs. Patient understands to avoid decongestants.    Pertinent Lab Values: Labs 12/30/2021: Serum creatinine 0.88, BUN 17, Potassium 4.6, Sodium 140  Vital Signs: Weight: *** (last clinic  weight: 141.38 lbs) Blood pressure: *** ~100/70 Heart rate: *** 55  Plan A.eplerenone to 50 mg daily - bmet 1-2 weeks B Entresto 49/51 if BP ok, but doubful C. Carvedilol to 6.25 bid if HR ok, but lows D. No changes  Assessment/Plan: HFrEF/iCM -Echo 12/2018 EF 35-40%.  -Echo 03/2020 EF 40-45%, RWMA -Presented with sustained VT and cardiogenic shock 11/2021>>underwent cardioversion followed by ICD placement on 12/13/2021.  -Echo 11/2021: EF 20-25%, multiple WMA, RV mildly reduced -R/LHC 11/2021: 3 v CAD with 90% p LAD prior to previous stent with otherwise stable CAD and patent previously placed stents in LAD & Lcx, RA 10, PCWP 14 mmHg, Fick CO 5.2/CI 2.9, PAPi 1.3 -11/2021 PCI to mLAD -NYHA II/early 3. Volume looks good on exam. *** -Continue coreg 3.125 mg BID -Continue farxiga 10 mg daily -Continue entresto 24/26 mg BID -Start eplerenone 25 mg daily. Had gynecomastia with spiro. -Repeat echo at f/u in July   CAD -Prior stents to LAD and Lcx. -LHC 11/2021 with 99% mid RCA, 100% mid to distal RCA, patent stents in LAD and Lcx, 90% ostial to p LAD.  -11/2021 PCI to mLAD -Denies angina. -Continue aspirin and plavix indefinitely given multiple stents -Continue high-intensity statin. -Enrolled in CR   VT -Presented with sustained VT 11/2021 and was cardioverted.  -Completed amiodarone load, continue amiodarone 200 mg daily -Seen by EP. Monomorphic VT felt to be d/t from inferior scar -S/p ICD 12/13/2021.   DM II A1c 7.1% Continue Farxiga Management per PCP   Hx syncope -prior hx syncopal episodes felt to be secondary to vasovagal episodes -seen in ED 11/2016 with presyncope which was also felt to be vasovagal in etiology. No arrhythmias on device interrogation   HLD -Recent LDL 130. On lipitor 80 mg daily. Nonadherence thought to be an issue.  -Now reports compliance with medical therapy. -Management per primary Cardiology team  Follow up *** 7/26 with DB for  echo   Audry Riles, PharmD, BCPS, BCCP, CPP Heart Failure Clinic Pharmacist [phone]

## 2022-01-20 ENCOUNTER — Ambulatory Visit (HOSPITAL_COMMUNITY)
Admission: RE | Admit: 2022-01-20 | Discharge: 2022-01-20 | Disposition: A | Discharge status: Home / Self Care | Payer: BC Managed Care – PPO | Source: Ambulatory Visit | Attending: Cardiology | Admitting: Cardiology

## 2022-01-20 ENCOUNTER — Encounter (HOSPITAL_COMMUNITY): Payer: Self-pay

## 2022-01-20 VITALS — BP 110/68 | HR 82 | Wt 148.2 lb

## 2022-01-20 DIAGNOSIS — G4733 Obstructive sleep apnea (adult) (pediatric): Secondary | ICD-10-CM | POA: Insufficient documentation

## 2022-01-20 DIAGNOSIS — E269 Hyperaldosteronism, unspecified: Secondary | ICD-10-CM | POA: Diagnosis not present

## 2022-01-20 DIAGNOSIS — R55 Syncope and collapse: Secondary | ICD-10-CM | POA: Diagnosis not present

## 2022-01-20 DIAGNOSIS — I2510 Atherosclerotic heart disease of native coronary artery without angina pectoris: Secondary | ICD-10-CM | POA: Insufficient documentation

## 2022-01-20 DIAGNOSIS — I5020 Unspecified systolic (congestive) heart failure: Secondary | ICD-10-CM | POA: Diagnosis not present

## 2022-01-20 DIAGNOSIS — I472 Ventricular tachycardia, unspecified: Secondary | ICD-10-CM | POA: Insufficient documentation

## 2022-01-20 DIAGNOSIS — I5022 Chronic systolic (congestive) heart failure: Secondary | ICD-10-CM | POA: Diagnosis not present

## 2022-01-20 DIAGNOSIS — E119 Type 2 diabetes mellitus without complications: Secondary | ICD-10-CM | POA: Diagnosis not present

## 2022-01-20 DIAGNOSIS — Z79899 Other long term (current) drug therapy: Secondary | ICD-10-CM | POA: Insufficient documentation

## 2022-01-20 DIAGNOSIS — K2270 Barrett's esophagus without dysplasia: Secondary | ICD-10-CM | POA: Insufficient documentation

## 2022-01-20 DIAGNOSIS — E785 Hyperlipidemia, unspecified: Secondary | ICD-10-CM | POA: Insufficient documentation

## 2022-01-20 DIAGNOSIS — I110 Hypertensive heart disease with heart failure: Secondary | ICD-10-CM | POA: Diagnosis not present

## 2022-01-20 MED ORDER — EPLERENONE 25 MG PO TABS: 25.0000 mg | ORAL_TABLET | Freq: Every day | ORAL | 6 refills | Status: DC

## 2022-01-20 MED ORDER — SACUBITRIL-VALSARTAN 24-26 MG PO TABS: 1.0000 | ORAL_TABLET | Freq: Two times a day (BID) | ORAL | 3 refills | Status: DC

## 2022-01-20 NOTE — Patient Instructions (Signed)
It was a pleasure seeing you today!  MEDICATIONS: -We are changing your medications today -Increase Entresto to 24/6 mg (1 tablet) twice daily -Call if you have questions about your medications.  NEXT APPOINTMENT: Return to clinic in 02/13/2022 with NP/PA.  In general, to take care of your heart failure: -Limit your fluid intake to 2 Liters (half-gallon) per day.   -Limit your salt intake to ideally 2-3 grams (2000-3000 mg) per day. -Weigh yourself daily and record, and bring that "weight diary" to your next appointment.  (Weight gain of 2-3 pounds in 1 day typically means fluid weight.) -The medications for your heart are to help your heart and help you live longer.   -Please contact us before stopping any of your heart medications.  Call the clinic at [phone] with questions or to reschedule future appointments.

## 2022-01-20 NOTE — Progress Notes (Signed)
Advanced Heart Failure Clinic Note   Referring Physician: Dr. Johney Frame Primary Care: Dr. Vanessa Shiprock Primary Cardiologist: Dr. Martinique HF Cardiologist: Dr. Haroldine Laws   HPI:  Referred to clinic by Dr. Johney Frame with Cardiology for heart failure consultation. 56 y.o. male with history of CAD, ICM, HTN, HLD, DM II, Barrett's esophagus, hx adrenal tumor and hyperaldosteronism s/p adrenal gland resection at [place], and OSA.    He had PCI/stenting to m LAD and m Lcx in 2015. Had EF 35-40% on echo in May 2020. Cardiac cath 12/2018 with 99% mid RCA, 100% mid to distal RCA with left to right collaterals from LAD, 80% p to m LAD treated with DES, patent left Cx stent. Later had recurrent syncopal episodes. Wore event monitor in 2021 with no arrhythmias to explain symptoms. Syncope felt to be likely vasovagal or orthostatic.    Presented 12/11/2021 via EMS with sustained VT resulting in cardiogenic shock. BCx and procalcitonin negative. Given 2 boluses IV amiodarone and underwent urgent cardioversion. Echo EF 20-25%, akinesis LV basal mid inferoseptal wall and anteroseptal wall, mildly reduced RV.    R/LHC, 99% mid RCA, 100% mid to distal RCA, 90% ostial to p LAD, RA 10, PCWP 14 mmHg, Fick CO 5.2/CI 2.9, PAPi 1.3. EP consulted, monomorphic VT felt to be scar mediated. Underwent single-lead [employer] ICD by Dr. Quentin Ore 12/13/2021. Initiated on po amiodarone.    Returned to ED on 12/17/2021 with hypotension, nausea and diaphoresis. He was given IV hydration. No VT on device check. Felt to have a vasovagal episode. Felt to be stable for discharge home.    He had follow-up post hospitalization on 12/23/2021 with HF TOC clinic. He denied any recurrent presyncope or syncope since his ED visit, denied shortness of breath, but reported chronic fatigue. No orthopnea, PND or lower extremity edema. He had been compliant with medications. He was started on eplerenone 25 mg daily as he had gynecomastia with  spironolactone.   Underwent PCI to LAD with overlapping DES to the mLAD by Dr. Martinique on 12/26/2021.  He had cardiology follow-up on 12/30/2021 where he was enrolled in cardiac rehab. No medications were titrated due to reports of low BP at home. He also had a new rash, and it was noted that it could potentially be from one of his medications.      Today he returns to HF clinic for pharmacist medication titration. Of note, he did not ever start the eplerenone due to cost. Overall he complains of "feeling foggy" sometimes. He denies any syncopal episodes since the last visit. He does note he gets dizzy or lightheaded when going from sitting to standing. He has a BP cuff at home, but does not check his BP. I encouraged him to take his BP at home when he feels dizzy. He denies fatigue or palpitations. He does note some chronic chest pain, which is dull in nature which occurs on both the L and R side of his chest.  He is SOB when hurrying to walk. He has been walking for exercise with his grandchildren outside. He walked up some stairs today for the first time on his way to this appointment as he was running late and felt some SOB. He did not start cardiac rehab. He is able to complete all ADLs, but has to take his time and take breaks. His weight at home is in the 140s, he notes it has increased about 10 lbs from hospital discharge. He is not currently on a loop  diuretic. No LEE on exam. He sleeps on 2 pillows and has sleep apnea, but does not use a CPAP. Denies PND or orthopnea. His appetite has been good, but he admits to eating a lot out of boredom. He avoids adding salt to his food and has cut back on fast food, which he would typically have for lunch at work.  HF Medications: Carvedilol 3.125 mg BID Entresto 24/26 mg BID - patient states only taking once daily Farxiga 10 mg daily Eplerenone 25 mg daily - patient not taking, he never picked up due to cost  Has the patient been experiencing any side  effects to the medications prescribed? Rash has resolved   Does the patient have any problems obtaining medications due to transportation or finances?  [employer]	[place]. He has been approved for [employer]. Of note, his insurance premium was paid late so he was unable to fill his [place] earlier this week. The patient was given samples from another office.    Understanding of regimen: fair Understanding of indications: fair Potential of compliance: fair Patient understands to avoid NSAIDs. Patient understands to avoid decongestants.    Pertinent Lab Values: Labs 12/30/2021: Serum creatinine 0.88, BUN 17, Potassium 4.6, Sodium 140  Vital Signs: Weight: 148.2 lbs (last clinic weight: 141.38 lbs) Blood pressure: 110/68 mmHg Heart rate: 82 bpm  Assessment/Plan: HFrEF/iCM -Echo 12/2018 EF 35-40%.  -Echo 03/2020 EF 40-45%, RWMA -Presented with sustained VT and cardiogenic shock 11/2021>>underwent cardioversion followed by ICD placement on 12/13/2021.  -Echo 11/2021: EF 20-25%, multiple WMA, RV mildly reduced -R/LHC 11/2021: 3 v CAD with 90% p LAD prior to previous stent with otherwise stable CAD and patent previously placed stents in LAD & Lcx, RA 10, PCWP 14 mmHg, Fick CO 5.2/CI 2.9, PAPi 1.3 -11/2021 PCI to mLAD -NYHA II/early III. Weight is up but appears euvolemic on exam.  -Continue carvedilol 3.125 mg BID -Continue Farxiga 10 mg daily -Increase Entresto to 24/26 mg BID - patient was only taking once daily -Start eplerenone 25 mg daily - called pharmacy after the visit, cost $0, likely had not met his deductible or tried to pick-up during his coverage gap, BMET at 02/13/2022 follow-up -Repeat echo at f/u in July   CAD -Prior stents to LAD and Lcx. -LHC 11/2021 with 99% mid RCA, 100% mid to distal RCA, patent stents in LAD and Lcx, 90% ostial to p LAD.  -11/2021 PCI to mLAD -Continue aspirin and plavix indefinitely given multiple stents -Continue  high-intensity statin. -Referred to CR   VT -Presented with sustained VT 11/2021 and was cardioverted.  -Completed amiodarone load, continue amiodarone 200 mg daily -Seen by EP. Monomorphic VT felt to be d/t from inferior scar -S/p ICD 12/13/2021.   DM II A1c 7.1% Continue Farxiga Management per PCP   Hx syncope -prior hx syncopal episodes felt to be secondary to vasovagal episodes -seen in ED 11/2016 with presyncope which was also felt to be vasovagal in etiology. No arrhythmias on device interrogation   HLD -Recent LDL 130. On lipitor 80 mg daily. Nonadherence thought to be an issue.  -Now reports compliance with medical therapy. -Management per primary Cardiology team  Follow up 02/13/2022 with NP/PA and 03/26/2022 with Dr. Haroldine Laws.    Audry Riles, PharmD, BCPS, BCCP, CPP Heart Failure Clinic Pharmacist [phone]

## 2022-01-23 ENCOUNTER — Telehealth: Payer: Self-pay | Admitting: Physician Assistant

## 2022-01-23 NOTE — Telephone Encounter (Signed)
Pt called because he has been having low BP, orthostatic sx.  SBP 88 at one point today, has come up to approx 100.   Feels tired, no syncope, just the one episode of presyncope.  Reviewed meds w/ him, the only new med is eplerenone, he started that on 05/22.  Requested he hold the [employer].  Hold the eplerenone for 2 days.  Follow BP and sx for the 2 days.  Then restart the eplerenone if BP is better. Continue to follow BP and sx, let us know if BP drops again. If so, will need to hold rx till seen, then can reassess med regimen.  He repeated the plan back to me and is agreeable.  Rosaria Ferries, PA-C 01/23/2022 8:18 PM

## 2022-01-24 ENCOUNTER — Telehealth: Payer: Self-pay | Admitting: Cardiology

## 2022-01-24 NOTE — Telephone Encounter (Signed)
Informed patient the FMLA paperwork was faxed this morning.

## 2022-01-24 NOTE — Telephone Encounter (Signed)
Pt calling back to f/u on son's FMLA paperwork. Pt states that as of 01/22/22 his insurance is stating that they still have not received anything. Please advise

## 2022-01-27 ENCOUNTER — Telehealth: Payer: Self-pay | Admitting: Physician Assistant

## 2022-01-27 NOTE — Telephone Encounter (Signed)
Pt called because he continues to have problems w/ low BP.  He has held the eplerenone, continues to take the [place] and Coreg.  Today, he had another episode where he did a minor errand and felt exhausted when he got back.   He took his BP and SBP was in the 80s.  No other sx or problems.  Requested he hold the [facility] 24-26.  Will send a message to the office to get him evaluated.  Call us if sx continue.  Rosaria Ferries, PA-C 01/27/2022 3:38 PM

## 2022-01-28 ENCOUNTER — Encounter (HOSPITAL_BASED_OUTPATIENT_CLINIC_OR_DEPARTMENT_OTHER): Payer: Self-pay | Admitting: Family

## 2022-01-28 ENCOUNTER — Ambulatory Visit (INDEPENDENT_AMBULATORY_CARE_PROVIDER_SITE_OTHER): Payer: BC Managed Care – PPO | Admitting: Family

## 2022-01-28 VITALS — BP 103/70 | HR 79 | Ht 67.0 in | Wt 148.6 lb

## 2022-01-28 DIAGNOSIS — E785 Hyperlipidemia, unspecified: Secondary | ICD-10-CM | POA: Diagnosis not present

## 2022-01-28 DIAGNOSIS — I5022 Chronic systolic (congestive) heart failure: Secondary | ICD-10-CM | POA: Diagnosis not present

## 2022-01-28 DIAGNOSIS — E118 Type 2 diabetes mellitus with unspecified complications: Secondary | ICD-10-CM

## 2022-01-28 DIAGNOSIS — I25118 Atherosclerotic heart disease of native coronary artery with other forms of angina pectoris: Secondary | ICD-10-CM | POA: Diagnosis not present

## 2022-01-28 DIAGNOSIS — I472 Ventricular tachycardia, unspecified: Secondary | ICD-10-CM

## 2022-01-28 DIAGNOSIS — R5383 Other fatigue: Secondary | ICD-10-CM | POA: Diagnosis not present

## 2022-01-28 NOTE — Telephone Encounter (Signed)
Noted will address at clinic visit.  Loel Dubonnet, NP

## 2022-01-28 NOTE — Progress Notes (Signed)
Office Visit    Patient Name: LEE KUANG Date of Encounter: 01/28/2022  PCP:  [name], [place]  Cardiologist:  Peter Martinique, MD  Advanced Practice Provider:  No care team member to display Electrophysiologist:  [name], MD    Chief Complaint    YAFET CLINE is a 56 y.o. male with a hx of CAD, HTN, ICM s/p ICD, VT,, syncope, HLD, chronic back pain, DM2, Barrett's esophagus, adrenal tumor s/p adrenal gland resection at [facility], HTN, HLD, primary hyperaldosteronism s/p adrenalectomy, OSA presents today for hospital follow up.   Past Medical History    Past Medical History:  Diagnosis Date   Adrenal tumor    a. s/p adrenal gland resection at [facility]. left side removal per patient   Anxiety    Barrett's esophagus    EGD - 11/27/09 - short segment of Barrett's   CAD (coronary artery disease)    a. 04/27/14 [place] s/p DES to mLAD and DES to mLCx   Chronic back pain    upper and lower per patient   Chronic chest pain    Degenerative joint disease    Bilateral knees. Significant knee pain since playing football in high school. also in back per patient   Depression    Diabetes mellitus type 2, controlled ([place]) 08/29/2015   Diagnosed by A1c 7.6% during 08/26/15 hospital admission for chest pain   Gastroesophageal reflux disease with hiatal hernia    GERD (gastroesophageal reflux disease)    Hiatal hernia    EGD - 11/27/2009   Hyperlipidemia    Hypertension    Low back pain    Primary hyperaldosteronism ([place]) 01/22/2012   S/P adrenalectomy      PTSD (post-traumatic stress disorder)    Seizures ([place])    Sleep apnea    Stone, kidney    Syncope 04/10/2019   Past Surgical History:  Procedure Laterality Date   ADRENALECTOMY  10/2013   CARDIAC CATHETERIZATION  05/01/2014   Patent stents, other disease unchanged   CARDIAC CATHETERIZATION  04/27/2014   Procedure: CORONARY STENT INTERVENTION;  Surgeon: Peter M Martinique, MD;  Location: [facility]  CATH LAB;  Service: Cardiovascular;;  DES mid Cx  DES mid LAD   CARDIAC CATHETERIZATION N/A 03/22/2015   Procedure: Left Heart Cath and Coronary Angiography;  Surgeon: Sherren Mocha, MD;  Location: [place] CV LAB;  Service: Cardiovascular;  Laterality: N/A;   CORONARY ANGIOPLASTY WITH STENT PLACEMENT  04/27/2014   3.0 x 16 mm Promus DES to the mid LAD and 3.5 x 28 mm Promus to the mid LCx, otherwise 20-30 percent lesions, EF 55%   CORONARY STENT INTERVENTION N/A 01/04/2019   Procedure: CORONARY STENT INTERVENTION;  Surgeon: Troy Sine, MD;  Location: [place] CV LAB;  Service: Cardiovascular;  Laterality: N/A;   CORONARY STENT INTERVENTION N/A 12/26/2021   Procedure: CORONARY STENT INTERVENTION;  Surgeon: Martinique, Peter M, MD;  Location: [place] CV LAB;  Service: Cardiovascular;  Laterality: N/A;   ICD IMPLANT N/A 12/13/2021   Procedure: ICD IMPLANT;  Surgeon: [name], MD;  Location: [place] CV LAB;  Service: Cardiovascular;  Laterality: N/A;   KIDNEY STONE SURGERY  X 1   KNEE ARTHROPLASTY Right 1984   KNEE ARTHROSCOPY Right X 6   LEFT HEART CATH AND CORONARY ANGIOGRAPHY N/A 01/04/2019   Procedure: LEFT HEART CATH AND CORONARY ANGIOGRAPHY;  Surgeon: Troy Sine, MD;  Location: [place] CV LAB;  Service: Cardiovascular;  Laterality: N/A;   LEFT HEART CATHETERIZATION WITH CORONARY ANGIOGRAM N/A 04/27/2014   Procedure: LEFT HEART CATHETERIZATION WITH CORONARY ANGIOGRAM;  Surgeon: Peter M Martinique, MD;  Location[facility] CATH LAB;  Service: Cardiovascular;  Laterality: N/A;   LEFT HEART CATHETERIZATION WITH CORONARY ANGIOGRAM N/A 05/01/2014   Procedure: LEFT HEART CATHETERIZATION WITH CORONARY ANGIOGRAM;  Surgeon: Burnell Blanks, MD;  Location: [facility] CATH LAB;  Service: Cardiovascular;  Laterality: N/A;   LITHOTRIPSY  X 2   RIGHT/LEFT HEART CATH AND CORONARY ANGIOGRAPHY N/A 12/12/2021   Procedure: RIGHT/LEFT HEART CATH AND CORONARY ANGIOGRAPHY;  Surgeon: Jolaine Artist, MD;  Location: [place] CV LAB;  Service: Cardiovascular;  Laterality: N/A;    Allergies  Allergies  Allergen Reactions   Ozempic (0.25 Or 0.5 Mg-Dose) [Semaglutide(0.25 Or 0.'5mg'$ -Dos)] Nausea And Vomiting and Other (See Comments)    Also had skin reaction with first injection.  Tolerated second shot dermatologically.  GI intolerance lead to > 5 lb. weight loss in two weeks.     Cortisone Acetate Swelling    Swelling at injection site   Darvocet [Propoxyphene N-Acetaminophen] Nausea And Vomiting   Nsaids Nausea And Vomiting and Other (See Comments)    Caused stomach ulcers in the past    Spironolactone Other (See Comments)    gynecomastia   Xanax [Alprazolam] Other (See Comments)    Pt states causes him to be mean, irritable   Tape Itching and Rash    History of Present Illness    GWIN EAGON is a 56 y.o. male with a hx of CAD, HTN, ICM s/p ICD, syncope, HLD, chronic back pain, DM2, Barrett's esophagus, adrenal tumor s/p adrenal gland resection at [facility], HTN, HLD, primary hyperaldosteronism s/p adrenalectomy, OSA last seen while hospitalized  He had abnormal stress test in 2015 with subseuqent cardiac catheterization showing two-vessel disease with stenting of mid LAD and mid LCx. Repeat catheterization later that year due to chest pain with patent stents. November 2015 admission with normal myoview. Cardiac catheterization July 2016 with patent stents. Admission May 2020 with chest discomfort. Echo 01/04/19 LVEF 35-40%, new inferior wall motion abnormality. Cardiac cath showed 99% mid RCA disease, 100% mid ost to prox LAD disease, patent prox LAD stent, 80% prox to mid LAD lesion treated with Onyx DES and patent LCx stent. Lipid with LDL 136 at that time. He was discharged on Aspirin and Plavix.   Previous intolerance to Imdur and unable to afford Ranexa. Repatha has been considered in the past but was cost prohibitive.   When seen 07/2020 by Dr. Martinique it was noted that over the  past several months he had episodes of feeling hot, sweaty, lightheaded, and tunnel vision. He had syncopal episode 02/21/20. Also noted brief convulsions per girlfriend. He was seen by neurology with MRI and EEG ordered. MRI with chronic ischemic changes but no acute findings. He wore 30-day cardiac event monitor which showed normal sinus rhythm, rare PAC's, occasional PVC with burden 4%. Echocardiogram showed improvement in LVEF to 40-45%. At follow up in November he noted no recurrent syncope but lightheadedness particularly with position changes. His Carvedilol was discontinued.Some degree of autonomic dysfunction related to his diabetes was thought to be contributory.   He was seen in clinic 03/2021 for syncope. Prior PCP workup with orthostatic vitals and EKG unremarkable. Had taken viagra prior to event. Was encouraged to eat and drink regularly to prevent hypotension. Carotid duplex ordered and performed 03/2021 with no stenosis. Recommended for 4  months follow up which was not completed.   Admitted 4/12-4/15/23 with VT requiring shock x1 in ED. RHC with no evidence of cardiogenic shock. LHC with new lesion90%  in LAD but VT felt to come from inferior scar. LVEF 20-25%. ICD was implanted 12/17/21. Discharged on [place], [place], Farxiga. No Spironolactone due to previous gynecomastia. Readmitted 12/26/21 for planned staged intervention to LAD with orbital atherectomy and DESx1 overlapping with stent in mid LAD. Discharged on ASA/Plavix indefinitely.  Contacted office yesterday with hypotension. Was instructed to hold Entresto, Eplerenone. Presents today for follow up independently. Tells me he is not certain when the low blood pressure episodes started. He has been having episodes of tiredness which he attributed to recovering from hospitalization, heart failure. Tells me he felt "foggy" while running errands [facility] last week and SBP <100. When checked at home SBP got as low as 80/50 associated with fatigue,  lightheadedness and sensation of feeling "drunk ".  He removed his eplerenone from his medication packets.  Notes he actually took [place] today.  He has been taking all of his medications daily rather than carvedilol twice daily as prescribed.  He is eating 2 meals per day.  He is drinking six 16 ounce bottles of water with electrolyte supplementation per day.  We discussed avoidance of electrolyte supplementation due to high sodium content and reducing fluid intake to less than 64 ounces daily.  He is monitoring weight at home and reports it has been stable.  Per his report has not yet been cleared to drive which makes getting to cardiac rehab difficult and interested in pursuing home health therapies.  He asked his caretaker for his 2 adult children.  Prior to hospitalization was working at [employer] in [employer].  EKGs/Labs/Other Studies Reviewed:   The following studies were reviewed today:  Cardiac Catheterization 12/26/2021:     Ost LAD to Prox LAD lesion is 90% stenosed.   A drug-eluting stent was successfully placed using a STENT ONYX FRONTIER 3.0X30.   Post intervention, there is a 0% residual stenosis.   Successful PCI of the proximal to mid LAD with OCT guidance, orbital atherectomy and DES x 1 overlapping with stent in the mid LAD.   Plan: anticipate same day DC. DAPT with ASA and Plavix indefinitely given multiple stents.    Diagnostic Dominance: Right Intervention   _____________  TTE 12/11/2021  1. Left ventricular ejection fraction, by estimation, is 20 to 25%. The  left ventricle has severely decreased function. The left ventricle has no  regional wall motion abnormalities. Left ventricular diastolic parameters  are indeterminate. There is  akinesis of the left ventricular, basal-mid inferoseptal wall and  anteroseptal wall. There is akinesis of the left ventricular, apical  septal wall. There is akinesis of the left ventricular, mid-apical  inferior wall and  anterior wall. There is  hypokinesis of the left ventricular, entire lateral wall and inferolateral  wall.   2. Right ventricular systolic function is mildly reduced. The right  ventricular size is mildly enlarged. The estimated right ventricular  systolic pressure is 33.8 mmHg.   3. Right atrial size was moderately dilated.   4. The mitral valve is normal in structure. Trivial mitral valve  regurgitation. No evidence of mitral stenosis.   5. The aortic valve is normal in structure. Aortic valve regurgitation is  not visualized. Aortic valve sclerosis is present, with no evidence of  aortic valve stenosis.   6. The inferior vena cava is dilated in size  with <50% respiratory  variability, suggesting right atrial pressure of 15 mmHg.    LHC/RHC 12/12/2021   Mid LAD lesion is 30% stenosed.   Mid RCA to Dist RCA lesion is 100% stenosed.   Mid RCA lesion is 99% stenosed.   Prox RCA lesion is 40% stenosed.   Ost LAD to Prox LAD lesion is 90% stenosed.   Non-stenotic Prox LAD lesion was previously treated.   Non-stenotic Prox LAD to Mid LAD lesion was previously treated.   Non-stenotic Ost Cx to Prox Cx lesion was previously treated.   The left ventricular ejection fraction is 25-35% by visual estimate.   Findings:   Ao = 110/68 (86) LV = 115/16 RA = 10  RV = 30/13 PA = 28/15 (21) PCW = 14 Fick cardiac output/index = 5.2/2.9 PVR = 1.3 WU FA sat = 97% PA sat = 69%, 70% PAPi = 1.3   Assessment: 1. 3v CAD with 90% lesion in proximal LAD prior to previous stent otherwise stable CAD with patency of previous LAD & LCx stents 2. iCM EF 30-35% 3. Relatively well-compensated hemodynamics with evidence of RV dysfunction ________________________________________________  Cath 01/04/2019 Mid RCA lesion is 99% stenosed. Mid RCA to Dist RCA lesion is 100% stenosed. Ost LAD to Prox LAD lesion is 40% stenosed. Previously placed Prox LAD stent (unknown type) is widely patent. Prox LAD to Mid  LAD lesion is 80% stenosed. A stent was successfully placed. Post intervention, there is a 0% residual stenosis. Post intervention, there is a 0% residual stenosis. Mid LAD lesion is 30% stenosed. Previously placed Ost Cx to Prox Cx stent (unknown type) is widely patent.   Multivessel CAD with 40% smooth ostial proximal stenosis of the LAD with a septal perforating artery that arises from the ostium and has a 90% stenosis (not able to be depicted in the diagram), widely patent stent proximally followed by new 80% stenosis the on the stented segment and proximal to the mid diagonal vessels.  There is 30% LAD stenosis beyond the mid diagonal vessels; patent moderate-sized ramus intermediate vessel; patent proximal left circumflex stent; and diffuse 99% to 100% occlusion of the mid distal RCA with evidence for moderate left to right collateralization from the LAD, septal perforating arteries, and LCX.   LVEDP 17 mmHg.   Successful PCI to the 80% LAD stenosis with ultimate insertion of a 2.5 x 18 mm Resolute Onyx DES stent postdilated to 2.6 mm with the 80% stenosis being reduced to 0%.   RECOMMENDATION: DAPT for minimum of 1 year and possibly long-term due to the patient's significant CAD history.  Aggressive lipid-lowering therapy with target LDL less than 70.  Medical therapy for RCA occlusion with collateralization.   Echo 03/01/20: IMPRESSIONS     1. Left ventricular ejection fraction, by estimation, is 40 to 45%. The  left ventricle has mildly decreased function. The left ventricle  demonstrates regional wall motion abnormalities (see scoring  diagram/findings for description). The left ventricular   internal cavity size was mildly dilated. Left ventricular diastolic  parameters are consistent with Grade I diastolic dysfunction (impaired  relaxation). There is severe hypokinesis of the left ventricular,  basal-mid inferoseptal wall and inferior wall.   2. Right ventricular systolic  function is normal. The right ventricular  size is normal.   3. The mitral valve is normal in structure. Trivial mitral valve  regurgitation.   4. The aortic valve is normal in structure. Aortic valve regurgitation is  not visualized.   5.  The inferior vena cava is normal in size with greater than 50%  respiratory variability, suggesting right atrial pressure of 3 mmHg.   Comparison(s): Prior images reviewed side by side. The left ventricular  function has improved.   Event monitor 04/09/20: Study Highlights   Normal sinus rhythm Very rare PACs Occasional PVCs. burden 4%  EKG: No EKG is  ordered today.    Recent Labs: 12/11/2021: Magnesium 1.8 12/17/2021: Hemoglobin 16.4; Platelets 214 12/23/2021: B Natriuretic Peptide 138.0 12/30/2021: ALT 45; BUN 15; Creatinine, Ser 0.88; Potassium 4.6; Sodium 140; TSH 0.860  Recent Lipid Panel    Component Value Date/Time   CHOL 197 12/12/2021 0104   CHOL 203 (H) 03/05/2021 1653   TRIG 95 12/12/2021 0104   HDL 48 12/12/2021 0104   HDL 46 03/05/2021 1653   CHOLHDL 4.1 12/12/2021 0104   VLDL 19 12/12/2021 0104   LDLCALC 130 (H) 12/12/2021 0104   LDLCALC 130 (H) 03/05/2021 1653   LDLDIRECT 169 (H) 07/08/2011 0944    Home Medications   Current Meds  Medication Sig   amiodarone (PACERONE) 200 MG tablet Take 2 tablets (400 mg total) by mouth 2 (two) times daily for 6 days, THEN 1 tablet (200 mg total) 2 (two) times daily for 7 days, THEN 1 tablet (200 mg total) daily.   aspirin EC 81 MG tablet Take 81 mg by mouth daily.   atorvastatin (LIPITOR) 80 MG tablet Take 1 tablet (80 mg total) by mouth daily.   carvedilol (COREG) 3.125 MG tablet Take 1 tablet (3.125 mg total) by mouth 2 (two) times daily with a meal.   clopidogrel (PLAVIX) 75 MG tablet Take 1 tablet by mouth once daily   dapagliflozin propanediol (FARXIGA) 10 MG TABS tablet Take 1 tablet by mouth once daily   glipiZIDE (GLUCOTROL) 5 MG tablet Take 1 tablet (5 mg total) by mouth daily.    HYDROcodone-acetaminophen (NORCO/VICODIN) 5-325 MG tablet Take 1-2 tablets by mouth every 8 (eight) hours as needed for moderate pain.   nitroGLYCERIN (NITROSTAT) 0.4 MG SL tablet Place 1 tablet (0.4 mg total) under the tongue every 5 (five) minutes as needed.   pantoprazole (PROTONIX) 40 MG tablet TAKE ONE TABLET BY MOUTH DAILY   sertraline (ZOLOFT) 100 MG tablet Take 2 tablets (200 mg total) by mouth daily.   sildenafil (VIAGRA) 50 MG tablet TAKE ONE TABLET BY MOUTH DAILY AS NEEDED FOR ERECTILE DYSFUNCTION   [DISCONTINUED] eplerenone (INSPRA) 25 MG tablet Take 1 tablet (25 mg total) by mouth daily.   [DISCONTINUED] sacubitril-valsartan (ENTRESTO) 24-26 MG Take 1 tablet by mouth 2 (two) times daily.     Review of Systems      All other systems reviewed and are otherwise negative except as noted above.  Physical Exam    VS:  BP 103/70 (BP Location: Right Arm, Patient Position: Sitting, Cuff Size: Normal)   Pulse 79   Ht '5\' 7"'$  (1.702 m)   Wt 148 lb 9.6 oz (67.4 kg)   SpO2 96%   BMI 23.27 kg/m  , BMI Body mass index is 23.27 kg/m.  Wt Readings from Last 3 Encounters:  01/28/22 148 lb 9.6 oz (67.4 kg)  01/20/22 148 lb 3.2 oz (67.2 kg)  12/30/21 141 lb (64 kg)    GEN: Well nourished, well developed, in no acute distress. HEENT: normal. Neck: Supple, no JVD, carotid bruits, or masses. Cardiac: RRR, no murmurs, rubs, or gallops. No clubbing, cyanosis, edema.  Radials/PT 2+ and equal bilaterally.  Respiratory:  Respirations regular and unlabored, clear to auscultation bilaterally. GI: Soft, nontender, nondistended. MS: No deformity or atrophy. Skin: Warm and dry, no rash. Neuro:  Strength and sensation are intact. Psych: Normal affect.  Assessment & Plan    CAD -prior stent to LAD and LCx.  11/2021 LHC with 99% mid RCA, 100% mid to distal RCA, patent stents LAD and circumflex, 90% ostial to proximal LAD with PCI to mid LAD.  Intolerant of nitrates. Indefinite DAPT Aspirin/Plavix.  GDMT includes Plavix, Atorvastatin, Aspirin, Coreg. Encouraged to participate in cardiac rehab.  However at this time has not yet been cleared to drive and as such we will refer to home health physical therapy as well as nursing for education and deconditioning.  ICM s/p ICD -most recent echo 11/2021 LVEF 20 to 25%, multiple wall motion abilities, RV mildly reducein setting of VT.  Weight up 7 pounds since clinic visit earlier this month. NYHA II/III.   Hypotension now limiting medication titration.  We will discontinue Entresto, eplerenone.  Continue carvedilol 3.125 mg and instructed to take twice daily as prescribed, Farxiga 10 mg daily.   I have asked him to start wearing compression stockings.    Low sodium diet, fluid restriction <2L, and daily weights encouraged. Educated to contact our office for weight gain of 2 lbs overnight or 5 lbs in one week.  Upcoming follow-up with heart failure clinic APP 02/13/2022 - if BP improved could consider resuming Eplerenone (prior intolerance to Spironolactone).  Upcoming echo 03/02/2022 to reassess LVEF. Update BMP, BNP, CBC to assess volume status, kidney function, rule out anemia as contributory to hypotension.  VT - Follows with EP. Continue Amiodarone. No recent palpitations nor ICD shock.  HLD - Continue Atorvastatin '80mg'$  QD. Unable to afford Repatha. Lipid lowering diet encouraged.   DM2 - 12/11/21 A1c 7.1. Continue to follow with PCP.    Disposition: Follow up in 2 weeks with advanced heart failure clinic APP as scheduled  Signed, Loel Dubonnet, NP 01/28/2022, 4:56 PM [place]

## 2022-01-28 NOTE — Telephone Encounter (Signed)
Called patient, placed him on the schedule for opening today- per [facility] patient should be evaluated.   Will route to NP/RN to make aware for upcoming appointment.   Patient verbalized understanding.

## 2022-01-28 NOTE — Patient Instructions (Signed)
Medication Instructions:   Your physician has recommended you make the following change in your medication:   STOP Entresto  STOP Eplerenone ____________  TAKE Coreg (Carvedilol) twice per day *try to take 12 hours apart  *If you need a refill on your cardiac medications before your next appointment, please call your pharmacy*   Lab Work: Your physician recommends that you return for lab work today: BNP, BMP, CBC  If you have labs (blood work) drawn today and your tests are completely normal, you will receive your results only by: [place] (if you have MyChart) OR A paper copy in the mail If you have any lab test that is abnormal or we need to change your treatment, we will call you to review the results.   Testing/Procedures: None ordered today.  Follow-Up: At [facility], you and your health needs are our priority.  As part of our continuing mission to provide you with exceptional heart care, we have created designated Provider Care Teams.  These Care Teams include your primary Cardiologist (physician) and Advanced Practice Providers (APPs -  Physician Assistants and Nurse Practitioners) who all work together to provide you with the care you need, when you need it.  We recommend signing up for the patient portal called "MyChart".  Sign up information is provided on this After Visit Summary.  MyChart is used to connect with patients for Virtual Visits (Telemedicine).  Patients are able to view lab/test results, encounter notes, upcoming appointments, etc.  Non-urgent messages can be sent to your provider as well.   To learn more about what you can do with MyChart, go to [email or web address].    Your next appointment:   02/13/22 as scheduled with the Advanced Heart Failure clinic  Other Instructions  Recommend wearing compression knee high compression socks during the daytime. Ones labeled 15-20 mmHg provide good compression.   Heart Healthy Diet  Recommendations: A low-salt diet is recommended. Meats should be grilled, baked, or boiled. Avoid fried foods. Focus on lean protein sources like fish or chicken with vegetables and fruits. The American Heart Association is a Microbiologist!  American Heart Association Diet and Lifeystyle Recommendations  Restrict to less than 2 liters of fluid per day.

## 2022-01-29 LAB — BASIC METABOLIC PANEL
BUN/Creatinine Ratio: 14 (ref 9–20)
BUN: 14 mg/dL (ref 6–24)
CO2: 20 mmol/L (ref 20–29)
Calcium: 9.5 mg/dL (ref 8.7–10.2)
Chloride: 102 mmol/L (ref 96–106)
Creatinine, Ser: 0.99 mg/dL (ref 0.76–1.27)
Glucose: 138 mg/dL — ABNORMAL HIGH (ref 70–99)
Potassium: 4.6 mmol/L (ref 3.5–5.2)
Sodium: 140 mmol/L (ref 134–144)
eGFR: 90 mL/min/{1.73_m2} (ref >59)

## 2022-01-29 LAB — CBC
Hematocrit: 43.2 % (ref 37.5–51.0)
Hemoglobin: 14.7 g/dL (ref 13.0–17.7)
MCH: 30.9 pg (ref 26.6–33.0)
MCHC: 34 g/dL (ref 31.5–35.7)
MCV: 91 fL (ref 79–97)
Platelets: 196 10*3/uL (ref 150–450)
RBC: 4.76 x10E6/uL (ref 4.14–5.80)
RDW: 12.9 % (ref 11.6–15.4)
WBC: 13.5 10*3/uL — ABNORMAL HIGH (ref 3.4–10.8)

## 2022-01-29 LAB — BRAIN NATRIURETIC PEPTIDE: BNP: 40.2 pg/mL (ref 0.0–100.0)

## 2022-01-30 DIAGNOSIS — F32A Depression, unspecified: Secondary | ICD-10-CM | POA: Diagnosis not present

## 2022-01-30 DIAGNOSIS — G4733 Obstructive sleep apnea (adult) (pediatric): Secondary | ICD-10-CM | POA: Diagnosis not present

## 2022-01-30 DIAGNOSIS — R55 Syncope and collapse: Secondary | ICD-10-CM | POA: Diagnosis not present

## 2022-01-30 DIAGNOSIS — I5022 Chronic systolic (congestive) heart failure: Secondary | ICD-10-CM | POA: Diagnosis not present

## 2022-01-30 DIAGNOSIS — E119 Type 2 diabetes mellitus without complications: Secondary | ICD-10-CM | POA: Diagnosis not present

## 2022-01-30 DIAGNOSIS — K2270 Barrett's esophagus without dysplasia: Secondary | ICD-10-CM | POA: Diagnosis not present

## 2022-01-30 DIAGNOSIS — R5383 Other fatigue: Secondary | ICD-10-CM | POA: Diagnosis not present

## 2022-01-30 DIAGNOSIS — G8929 Other chronic pain: Secondary | ICD-10-CM | POA: Diagnosis not present

## 2022-01-30 DIAGNOSIS — I255 Ischemic cardiomyopathy: Secondary | ICD-10-CM | POA: Diagnosis not present

## 2022-01-30 DIAGNOSIS — M1712 Unilateral primary osteoarthritis, left knee: Secondary | ICD-10-CM | POA: Diagnosis not present

## 2022-01-30 DIAGNOSIS — I110 Hypertensive heart disease with heart failure: Secondary | ICD-10-CM | POA: Diagnosis not present

## 2022-01-30 DIAGNOSIS — I472 Ventricular tachycardia, unspecified: Secondary | ICD-10-CM | POA: Diagnosis not present

## 2022-01-30 DIAGNOSIS — I25118 Atherosclerotic heart disease of native coronary artery with other forms of angina pectoris: Secondary | ICD-10-CM | POA: Diagnosis not present

## 2022-01-30 DIAGNOSIS — F419 Anxiety disorder, unspecified: Secondary | ICD-10-CM | POA: Diagnosis not present

## 2022-01-30 DIAGNOSIS — E785 Hyperlipidemia, unspecified: Secondary | ICD-10-CM | POA: Diagnosis not present

## 2022-01-30 DIAGNOSIS — M47815 Spondylosis without myelopathy or radiculopathy, thoracolumbar region: Secondary | ICD-10-CM | POA: Diagnosis not present

## 2022-02-01 DIAGNOSIS — R5383 Other fatigue: Secondary | ICD-10-CM | POA: Diagnosis not present

## 2022-02-01 DIAGNOSIS — F419 Anxiety disorder, unspecified: Secondary | ICD-10-CM | POA: Diagnosis not present

## 2022-02-01 DIAGNOSIS — E785 Hyperlipidemia, unspecified: Secondary | ICD-10-CM | POA: Diagnosis not present

## 2022-02-01 DIAGNOSIS — M47815 Spondylosis without myelopathy or radiculopathy, thoracolumbar region: Secondary | ICD-10-CM | POA: Diagnosis not present

## 2022-02-01 DIAGNOSIS — I472 Ventricular tachycardia, unspecified: Secondary | ICD-10-CM | POA: Diagnosis not present

## 2022-02-01 DIAGNOSIS — I255 Ischemic cardiomyopathy: Secondary | ICD-10-CM | POA: Diagnosis not present

## 2022-02-01 DIAGNOSIS — I25118 Atherosclerotic heart disease of native coronary artery with other forms of angina pectoris: Secondary | ICD-10-CM | POA: Diagnosis not present

## 2022-02-01 DIAGNOSIS — R55 Syncope and collapse: Secondary | ICD-10-CM | POA: Diagnosis not present

## 2022-02-01 DIAGNOSIS — F32A Depression, unspecified: Secondary | ICD-10-CM | POA: Diagnosis not present

## 2022-02-01 DIAGNOSIS — I5022 Chronic systolic (congestive) heart failure: Secondary | ICD-10-CM | POA: Diagnosis not present

## 2022-02-01 DIAGNOSIS — G4733 Obstructive sleep apnea (adult) (pediatric): Secondary | ICD-10-CM | POA: Diagnosis not present

## 2022-02-01 DIAGNOSIS — M1712 Unilateral primary osteoarthritis, left knee: Secondary | ICD-10-CM | POA: Diagnosis not present

## 2022-02-01 DIAGNOSIS — K2270 Barrett's esophagus without dysplasia: Secondary | ICD-10-CM | POA: Diagnosis not present

## 2022-02-01 DIAGNOSIS — E119 Type 2 diabetes mellitus without complications: Secondary | ICD-10-CM | POA: Diagnosis not present

## 2022-02-01 DIAGNOSIS — G8929 Other chronic pain: Secondary | ICD-10-CM | POA: Diagnosis not present

## 2022-02-01 DIAGNOSIS — I110 Hypertensive heart disease with heart failure: Secondary | ICD-10-CM | POA: Diagnosis not present

## 2022-02-03 ENCOUNTER — Other Ambulatory Visit: Payer: Self-pay | Admitting: Family Medicine

## 2022-02-03 DIAGNOSIS — G894 Chronic pain syndrome: Secondary | ICD-10-CM

## 2022-02-04 ENCOUNTER — Encounter: Payer: Self-pay | Admitting: Family Medicine

## 2022-02-04 ENCOUNTER — Ambulatory Visit (INDEPENDENT_AMBULATORY_CARE_PROVIDER_SITE_OTHER): Payer: BC Managed Care – PPO

## 2022-02-04 ENCOUNTER — Telehealth: Payer: Self-pay

## 2022-02-04 ENCOUNTER — Encounter: Payer: Self-pay | Admitting: *Deleted

## 2022-02-04 DIAGNOSIS — M1712 Unilateral primary osteoarthritis, left knee: Secondary | ICD-10-CM | POA: Diagnosis not present

## 2022-02-04 DIAGNOSIS — M47815 Spondylosis without myelopathy or radiculopathy, thoracolumbar region: Secondary | ICD-10-CM | POA: Diagnosis not present

## 2022-02-04 DIAGNOSIS — F32A Depression, unspecified: Secondary | ICD-10-CM | POA: Diagnosis not present

## 2022-02-04 DIAGNOSIS — R5383 Other fatigue: Secondary | ICD-10-CM | POA: Diagnosis not present

## 2022-02-04 DIAGNOSIS — E119 Type 2 diabetes mellitus without complications: Secondary | ICD-10-CM | POA: Diagnosis not present

## 2022-02-04 DIAGNOSIS — I472 Ventricular tachycardia, unspecified: Secondary | ICD-10-CM | POA: Diagnosis not present

## 2022-02-04 DIAGNOSIS — K2270 Barrett's esophagus without dysplasia: Secondary | ICD-10-CM | POA: Diagnosis not present

## 2022-02-04 DIAGNOSIS — G8929 Other chronic pain: Secondary | ICD-10-CM | POA: Diagnosis not present

## 2022-02-04 DIAGNOSIS — I110 Hypertensive heart disease with heart failure: Secondary | ICD-10-CM | POA: Diagnosis not present

## 2022-02-04 DIAGNOSIS — E785 Hyperlipidemia, unspecified: Secondary | ICD-10-CM | POA: Diagnosis not present

## 2022-02-04 DIAGNOSIS — I5022 Chronic systolic (congestive) heart failure: Secondary | ICD-10-CM | POA: Diagnosis not present

## 2022-02-04 DIAGNOSIS — I255 Ischemic cardiomyopathy: Secondary | ICD-10-CM | POA: Diagnosis not present

## 2022-02-04 DIAGNOSIS — I25118 Atherosclerotic heart disease of native coronary artery with other forms of angina pectoris: Secondary | ICD-10-CM | POA: Diagnosis not present

## 2022-02-04 DIAGNOSIS — F419 Anxiety disorder, unspecified: Secondary | ICD-10-CM | POA: Diagnosis not present

## 2022-02-04 DIAGNOSIS — G4733 Obstructive sleep apnea (adult) (pediatric): Secondary | ICD-10-CM | POA: Diagnosis not present

## 2022-02-04 DIAGNOSIS — R55 Syncope and collapse: Secondary | ICD-10-CM | POA: Diagnosis not present

## 2022-02-04 LAB — CUP PACEART INCLINIC DEVICE CHECK
Brady Statistic RV Percent Paced: 1 % — CL
Date Time Interrogation Session: 20230606155235
HighPow Impedance: 68 Ohm
HighPow Impedance: 75 Ohm
Implantable Lead Implant Date: 20230414
Implantable Lead Location: 753860
Implantable Lead Model: 672
Implantable Lead Serial Number: 182871
Implantable Pulse Generator Implant Date: 20230414
Lead Channel Impedance Value: 777 Ohm
Lead Channel Pacing Threshold Amplitude: 0.7 V
Lead Channel Pacing Threshold Pulse Width: 0.4 ms
Lead Channel Sensing Intrinsic Amplitude: 19.8 mV
Lead Channel Setting Pacing Amplitude: 3.5 V
Lead Channel Setting Pacing Pulse Width: 0.4 ms
Lead Channel Setting Sensing Sensitivity: 0.5 mV
Pulse Gen Serial Number: 310848

## 2022-02-04 MED ORDER — AMIODARONE HCL 200 MG PO TABS: 200.0000 mg | ORAL_TABLET | Freq: Two times a day (BID) | ORAL | 3 refills | Status: DC

## 2022-02-04 MED ORDER — HYDROCODONE-ACETAMINOPHEN 5-325 MG PO TABS: 1.0000 | ORAL_TABLET | Freq: Three times a day (TID) | ORAL | 0 refills | Status: DC | PRN

## 2022-02-04 NOTE — Telephone Encounter (Signed)
The patient called back to let us know that latitude system is down. I put him on the device clinic schedule for today at 3 pm.

## 2022-02-04 NOTE — Progress Notes (Addendum)
ICD check in clinic. Normal device function. Thresholds and sensing consistent with previous device measurements. Impedance trends stable over time. 1 VT arrhythmia, began as SVT then converted to VT, duration 1 minutes 43 seconds. Unable to contact Dr. Quentin Ore via phone so consulted with Dr. Caryl Comes. Dr. Caryl Comes in the assess patient and changes made to session by Dr. Caryl Comes (see below). Histogram distribution appropriate for patient and level of activity. Device programmed at appropriate safety margins, 3.5V for acute phase. Patient education completed including shock plan.  Patient advised no driving x6 months per [place] DMV with a restart date of 02/03/2022, pt voiced understanding.  Auditory/vibratory alert demonstrated.  Patient reports compliance with coreg 3.'125mg'$  BID, although only took 1 dose of amiodarone previously.   BP ~ 114/70, HR~ 68 bpm (taken in-clinic).  Changes made to this session by Dr. Caryl Comes - VT-1 Detection Interval programmed from 335m to 353 ms. - VT-1 Detection Rate programmed from 180 bpm to 170 bpm. Amiodarone started 200 mg BID (verbal order obtained from Dr. KCaryl Comes Read back and verified by this RN.   See attached media below for scanned details.

## 2022-02-04 NOTE — Telephone Encounter (Signed)
Patient called to advise he thinks he received and ICD shock last night. States he has been doing well, did help his son yesterday who is in a wheel chair which did exert himself.   Unfortunately, his remote monitor is showing an yellow light, patient will call tech services and have assist with monitor. Patient is not asymptomatic and aware of shock plan and if shocked today or has symptoms he needs to go to the ED for treatment/ call 911. Pt voiced understanding. Patient aware to call device clinic back asap with update on monitor.

## 2022-02-05 DIAGNOSIS — R55 Syncope and collapse: Secondary | ICD-10-CM | POA: Diagnosis not present

## 2022-02-05 DIAGNOSIS — E785 Hyperlipidemia, unspecified: Secondary | ICD-10-CM | POA: Diagnosis not present

## 2022-02-05 DIAGNOSIS — I5022 Chronic systolic (congestive) heart failure: Secondary | ICD-10-CM | POA: Diagnosis not present

## 2022-02-05 DIAGNOSIS — G4733 Obstructive sleep apnea (adult) (pediatric): Secondary | ICD-10-CM | POA: Diagnosis not present

## 2022-02-05 DIAGNOSIS — I110 Hypertensive heart disease with heart failure: Secondary | ICD-10-CM | POA: Diagnosis not present

## 2022-02-05 DIAGNOSIS — I472 Ventricular tachycardia, unspecified: Secondary | ICD-10-CM | POA: Diagnosis not present

## 2022-02-05 DIAGNOSIS — I255 Ischemic cardiomyopathy: Secondary | ICD-10-CM | POA: Diagnosis not present

## 2022-02-05 DIAGNOSIS — G8929 Other chronic pain: Secondary | ICD-10-CM | POA: Diagnosis not present

## 2022-02-05 DIAGNOSIS — R5383 Other fatigue: Secondary | ICD-10-CM | POA: Diagnosis not present

## 2022-02-05 DIAGNOSIS — F32A Depression, unspecified: Secondary | ICD-10-CM | POA: Diagnosis not present

## 2022-02-05 DIAGNOSIS — E119 Type 2 diabetes mellitus without complications: Secondary | ICD-10-CM | POA: Diagnosis not present

## 2022-02-05 DIAGNOSIS — F419 Anxiety disorder, unspecified: Secondary | ICD-10-CM | POA: Diagnosis not present

## 2022-02-05 DIAGNOSIS — M1712 Unilateral primary osteoarthritis, left knee: Secondary | ICD-10-CM | POA: Diagnosis not present

## 2022-02-05 DIAGNOSIS — M47815 Spondylosis without myelopathy or radiculopathy, thoracolumbar region: Secondary | ICD-10-CM | POA: Diagnosis not present

## 2022-02-05 DIAGNOSIS — K2270 Barrett's esophagus without dysplasia: Secondary | ICD-10-CM | POA: Diagnosis not present

## 2022-02-05 DIAGNOSIS — I25118 Atherosclerotic heart disease of native coronary artery with other forms of angina pectoris: Secondary | ICD-10-CM | POA: Diagnosis not present

## 2022-02-06 NOTE — Telephone Encounter (Signed)
Spoke with patient who reported that his blood pressure last night was 84/48 with P 79. This morning, BP 112/79, P 80. He stated his defibrillator went off on Monday. Upon reviewing the chart, changes were made to his defibrillator and medications were changed. Recommended to patient that going forward, when his device fires, he needs to add that to his Mychart message; however, I recommended that he call triage when the device fires, so we can respond to him quicker. Made appointment with Vella Raring for Monday 6/12.

## 2022-02-07 NOTE — Telephone Encounter (Signed)
Noted.  Will address at upcoming follow-up.  Loel Dubonnet, NP

## 2022-02-09 NOTE — Progress Notes (Unsigned)
Office Visit    Patient Name: Andrew Huerta Date of Encounter: 02/10/2022  PCP:  [name], [facility]  Cardiologist:  Peter Martinique, MD  Advanced Practice Provider:  No care team member to display Electrophysiologist:  [name], MD    Chief Complaint    Andrew Huerta is a 56 y.o. male with a hx of CAD, HTN, ICM s/p ICD, VT,, syncope, HLD, chronic back pain, DM2, Barrett's esophagus, adrenal tumor s/p adrenal gland resection at [facility], HTN, HLD, primary hyperaldosteronism s/p adrenalectomy, OSA presents today for follow up hypotension  Past Medical History    Past Medical History:  Diagnosis Date   Adrenal tumor    a. s/p adrenal gland resection at [facility]. left side removal per patient   Anxiety    Barrett's esophagus    EGD - 11/27/09 - short segment of Barrett's   CAD (coronary artery disease)    a. 04/27/14 [place] s/p DES to mLAD and DES to mLCx   Chronic back pain    upper and lower per patient   Chronic chest pain    Degenerative joint disease    Bilateral knees. Significant knee pain since playing football in high school. also in back per patient   Depression    Diabetes mellitus type 2, controlled ([place]) 08/29/2015   Diagnosed by A1c 7.6% during 08/26/15 hospital admission for chest pain   Gastroesophageal reflux disease with hiatal hernia    GERD (gastroesophageal reflux disease)    Hiatal hernia    EGD - 11/27/2009   Hyperlipidemia    Hypertension    Low back pain    Primary hyperaldosteronism ([place]) 01/22/2012   S/P adrenalectomy      PTSD (post-traumatic stress disorder)    Seizures ([place])    Sleep apnea    Stone, kidney    Syncope 04/10/2019   Past Surgical History:  Procedure Laterality Date   ADRENALECTOMY  10/2013   CARDIAC CATHETERIZATION  05/01/2014   Patent stents, other disease unchanged   CARDIAC CATHETERIZATION  04/27/2014   Procedure: CORONARY STENT INTERVENTION;  Surgeon: Peter M Martinique, MD;  Location: [facility]  CATH LAB;  Service: Cardiovascular;;  DES mid Cx  DES mid LAD   CARDIAC CATHETERIZATION N/A 03/22/2015   Procedure: Left Heart Cath and Coronary Angiography;  Surgeon: Sherren Mocha, MD;  Location: [place] CV LAB;  Service: Cardiovascular;  Laterality: N/A;   CORONARY ANGIOPLASTY WITH STENT PLACEMENT  04/27/2014   3.0 x 16 mm Promus DES to the mid LAD and 3.5 x 28 mm Promus to the mid LCx, otherwise 20-30 percent lesions, EF 55%   CORONARY STENT INTERVENTION N/A 01/04/2019   Procedure: CORONARY STENT INTERVENTION;  Surgeon: Troy Sine, MD;  Location: [place] CV LAB;  Service: Cardiovascular;  Laterality: N/A;   CORONARY STENT INTERVENTION N/A 12/26/2021   Procedure: CORONARY STENT INTERVENTION;  Surgeon: Martinique, Peter M, MD;  Location: [place] CV LAB;  Service: Cardiovascular;  Laterality: N/A;   ICD IMPLANT N/A 12/13/2021   Procedure: ICD IMPLANT;  Surgeon: [name], MD;  Location: [place] CV LAB;  Service: Cardiovascular;  Laterality: N/A;   KIDNEY STONE SURGERY  X 1   KNEE ARTHROPLASTY Right 1984   KNEE ARTHROSCOPY Right X 6   LEFT HEART CATH AND CORONARY ANGIOGRAPHY N/A 01/04/2019   Procedure: LEFT HEART CATH AND CORONARY ANGIOGRAPHY;  Surgeon: Troy Sine, MD;  Location: [place] CV LAB;  Service: Cardiovascular;  Laterality: N/A;   LEFT HEART CATHETERIZATION WITH CORONARY ANGIOGRAM N/A 04/27/2014   Procedure: LEFT HEART CATHETERIZATION WITH CORONARY ANGIOGRAM;  Surgeon: Peter M Martinique, MD;  Location: [facility] CATH LAB;  Service: Cardiovascular;  Laterality: N/A;   LEFT HEART CATHETERIZATION WITH CORONARY ANGIOGRAM N/A 05/01/2014   Procedure: LEFT HEART CATHETERIZATION WITH CORONARY ANGIOGRAM;  Surgeon: Burnell Blanks, MD;  Location: [facility] CATH LAB;  Service: Cardiovascular;  Laterality: N/A;   LITHOTRIPSY  X 2   RIGHT/LEFT HEART CATH AND CORONARY ANGIOGRAPHY N/A 12/12/2021   Procedure: RIGHT/LEFT HEART CATH AND CORONARY ANGIOGRAPHY;  Surgeon: Jolaine Artist, MD;  Location: [place] CV LAB;  Service: Cardiovascular;  Laterality: N/A;    Allergies  Allergies  Allergen Reactions   Ozempic (0.25 Or 0.5 Mg-Dose) [Semaglutide(0.25 Or 0.'5mg'$ -Dos)] Nausea And Vomiting and Other (See Comments)    Also had skin reaction with first injection.  Tolerated second shot dermatologically.  GI intolerance lead to > 5 lb. weight loss in two weeks.     Cortisone Acetate Swelling    Swelling at injection site   Darvocet [Propoxyphene N-Acetaminophen] Nausea And Vomiting   Nsaids Nausea And Vomiting and Other (See Comments)    Caused stomach ulcers in the past    Spironolactone Other (See Comments)    gynecomastia   Xanax [Alprazolam] Other (See Comments)    Pt states causes him to be mean, irritable   Tape Itching and Rash    History of Present Illness    Andrew Huerta is a 56 y.o. male with a hx of CAD, HTN, ICM s/p ICD, syncope, HLD, chronic back pain, DM2, Barrett's esophagus, adrenal tumor s/p adrenal gland resection at [facility], HTN, HLD, primary hyperaldosteronism s/p adrenalectomy, OSA last seen 01/28/22  He had abnormal stress test 2015 with cardiac catheterization showing two-vessel disease with stenting of mid LAD and mid LCx. Repeat cath later that year due to chest pain with patent stents. November 2015 admission with normal myoview. Cardiac cath July 2016 with patent stents. Admitted May 2020 with chest discomfort. Echo 01/04/19 LVEF 35-40%, new inferior wall motion abnormality. Cardiac cath showed 99% mid RCA disease, 100% mid ost to prox LAD disease, patent prox LAD stent, 80% prox to mid LAD lesion treated with Onyx DES and patent LCx stent. Lipid with LDL 136 at that time. He was discharged on Aspirin and Plavix.   Previous intolerance to Imdur and unable to afford Ranexa. Repatha has been considered in the past but was cost prohibitive.   When seen 07/2020 by Dr. Martinique noted that over the past several months he had episodes of feeling hot,  sweaty, lightheaded, and tunnel vision. He had syncope 02/21/20. Also noted brief convulsions per girlfriend. He was seen by neurology with MRI and EEG ordered. MRI with chronic ischemic changes but no acute findings. He wore 30-day cardiac event monitor which showed normal sinus rhythm, rare PAC's, occasional PVC with burden 4%. Echocardiogram showed improvement in LVEF to 40-45%. At follow up in November he noted no recurrent syncope but lightheadedness particularly with position changes. His Carvedilol was discontinued.Some degree of autonomic dysfunction related to his diabetes was thought to be contributory.   He was seen in clinic 03/2021 for syncope. Prior PCP workup with orthostatic vitals and EKG unremarkable. Had taken viagra prior to event. Was encouraged to eat and drink regularly to prevent hypotension. Carotid duplex ordered and performed 03/2021 with no stenosis. Recommended for 4 months follow up which was not  completed.   Admitted 4/12-4/15/23 with VT requiring shock x1 in ED. RHC with no evidence of cardiogenic shock. LHC with new lesion90%  in LAD but VT felt to come from inferior scar. LVEF 20-25%. ICD was implanted 12/17/21. Discharged on [place], [place], Farxiga. No Spironolactone due to previous gynecomastia. Readmitted 12/26/21 for planned staged intervention to LAD with orbital atherectomy and DESx1 overlapping with stent in mid LAD. Discharged on ASA/Plavix indefinitely.  Seen 01/28/22 due to hypotension. He was referred to [facility] PT and RN for education and deconditioning. Entresto and Eplerenone were discontinued. Coreg and [place] continued. He was recommended to stop electrolyte supplement due to high sodium content.   Presents today for follow up. 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 Prior to hospitalization was working at [employer] in [employer]. Weight up 5 pounds over the last 2 weeks. He notes still having occasional episodes of hypotension as low as 80s/60s.  Notes episodes of  hypotension most often when he is out.  For example had taken his son to the doctor and was waiting in line when he began to feel "drunk "but not quite lightheaded and checked blood pressure and it was 80s over 60s.  He does stay well-hydrated and eats 3 meals per day.  He did try compression socks but notes that when he thought were falling off his legs -discussed purchasing smaller size.  Notes family stressors dealing with his mother's estate and his step sisters.   Orthostatic VS for the past 24 hrs (Last 3 readings):  BP- Lying Pulse- Lying BP- Sitting Pulse- Sitting BP- Standing at 0 minutes Pulse- Standing at 0 minutes BP- Standing at 3 minutes Pulse- Standing at 3 minutes  02/10/22 0828 103/72 78 105/75 82 103/74 82 104/74 83    EKGs/Labs/Other Studies Reviewed:   The following studies were reviewed today:  Cardiac Catheterization 12/26/2021:     Ost LAD to Prox LAD lesion is 90% stenosed.   A drug-eluting stent was successfully placed using a STENT ONYX FRONTIER 3.0X30.   Post intervention, there is a 0% residual stenosis.   Successful PCI of the proximal to mid LAD with OCT guidance, orbital atherectomy and DES x 1 overlapping with stent in the mid LAD.   Plan: anticipate same day DC. DAPT with ASA and Plavix indefinitely given multiple stents.    Diagnostic Dominance: Right Intervention   _____________  TTE 12/11/2021  1. Left ventricular ejection fraction, by estimation, is 20 to 25%. The  left ventricle has severely decreased function. The left ventricle has no  regional wall motion abnormalities. Left ventricular diastolic parameters  are indeterminate. There is  akinesis of the left ventricular, basal-mid inferoseptal wall and  anteroseptal wall. There is akinesis of the left ventricular, apical  septal wall. There is akinesis of the left ventricular, mid-apical  inferior wall and anterior wall. There is  hypokinesis of the left ventricular, entire lateral wall and  inferolateral  wall.   2. Right ventricular systolic function is mildly reduced. The right  ventricular size is mildly enlarged. The estimated right ventricular  systolic pressure is 16.0 mmHg.   3. Right atrial size was moderately dilated.   4. The mitral valve is normal in structure. Trivial mitral valve  regurgitation. No evidence of mitral stenosis.   5. The aortic valve is normal in structure. Aortic valve regurgitation is  not visualized. Aortic valve sclerosis is present, with no evidence of  aortic valve stenosis.   6. The inferior vena cava  is dilated in size with <50% respiratory  variability, suggesting right atrial pressure of 15 mmHg.    LHC/RHC 12/12/2021   Mid LAD lesion is 30% stenosed.   Mid RCA to Dist RCA lesion is 100% stenosed.   Mid RCA lesion is 99% stenosed.   Prox RCA lesion is 40% stenosed.   Ost LAD to Prox LAD lesion is 90% stenosed.   Non-stenotic Prox LAD lesion was previously treated.   Non-stenotic Prox LAD to Mid LAD lesion was previously treated.   Non-stenotic Ost Cx to Prox Cx lesion was previously treated.   The left ventricular ejection fraction is 25-35% by visual estimate.   Findings:   Ao = 110/68 (86) LV = 115/16 RA = 10  RV = 30/13 PA = 28/15 (21) PCW = 14 Fick cardiac output/index = 5.2/2.9 PVR = 1.3 WU FA sat = 97% PA sat = 69%, 70% PAPi = 1.3   Assessment: 1. 3v CAD with 90% lesion in proximal LAD prior to previous stent otherwise stable CAD with patency of previous LAD & LCx stents 2. iCM EF 30-35% 3. Relatively well-compensated hemodynamics with evidence of RV dysfunction ________________________________________________  Cath 01/04/2019 Mid RCA lesion is 99% stenosed. Mid RCA to Dist RCA lesion is 100% stenosed. Ost LAD to Prox LAD lesion is 40% stenosed. Previously placed Prox LAD stent (unknown type) is widely patent. Prox LAD to Mid LAD lesion is 80% stenosed. A stent was successfully placed. Post intervention, there  is a 0% residual stenosis. Post intervention, there is a 0% residual stenosis. Mid LAD lesion is 30% stenosed. Previously placed Ost Cx to Prox Cx stent (unknown type) is widely patent.   Multivessel CAD with 40% smooth ostial proximal stenosis of the LAD with a septal perforating artery that arises from the ostium and has a 90% stenosis (not able to be depicted in the diagram), widely patent stent proximally followed by new 80% stenosis the on the stented segment and proximal to the mid diagonal vessels.  There is 30% LAD stenosis beyond the mid diagonal vessels; patent moderate-sized ramus intermediate vessel; patent proximal left circumflex stent; and diffuse 99% to 100% occlusion of the mid distal RCA with evidence for moderate left to right collateralization from the LAD, septal perforating arteries, and LCX.   LVEDP 17 mmHg.   Successful PCI to the 80% LAD stenosis with ultimate insertion of a 2.5 x 18 mm Resolute Onyx DES stent postdilated to 2.6 mm with the 80% stenosis being reduced to 0%.   RECOMMENDATION: DAPT for minimum of 1 year and possibly long-term due to the patient's significant CAD history.  Aggressive lipid-lowering therapy with target LDL less than 70.  Medical therapy for RCA occlusion with collateralization.   Echo 03/01/20: IMPRESSIONS     1. Left ventricular ejection fraction, by estimation, is 40 to 45%. The  left ventricle has mildly decreased function. The left ventricle  demonstrates regional wall motion abnormalities (see scoring  diagram/findings for description). The left ventricular   internal cavity size was mildly dilated. Left ventricular diastolic  parameters are consistent with Grade I diastolic dysfunction (impaired  relaxation). There is severe hypokinesis of the left ventricular,  basal-mid inferoseptal wall and inferior wall.   2. Right ventricular systolic function is normal. The right ventricular  size is normal.   3. The mitral valve is normal in  structure. Trivial mitral valve  regurgitation.   4. The aortic valve is normal in structure. Aortic valve regurgitation is  not  visualized.   5. The inferior vena cava is normal in size with greater than 50%  respiratory variability, suggesting right atrial pressure of 3 mmHg.   Comparison(s): Prior images reviewed side by side. The left ventricular  function has improved.   Event monitor 04/09/20: Study Highlights   Normal sinus rhythm Very rare PACs Occasional PVCs. burden 4%  EKG: No EKG is  ordered today.    Recent Labs: 12/11/2021: Magnesium 1.8 12/30/2021: ALT 45; TSH 0.860 01/28/2022: BNP 40.2; BUN 14; Creatinine, Ser 0.99; Hemoglobin 14.7; Platelets 196; Potassium 4.6; Sodium 140  Recent Lipid Panel    Component Value Date/Time   CHOL 197 12/12/2021 0104   CHOL 203 (H) 03/05/2021 1653   TRIG 95 12/12/2021 0104   HDL 48 12/12/2021 0104   HDL 46 03/05/2021 1653   CHOLHDL 4.1 12/12/2021 0104   VLDL 19 12/12/2021 0104   LDLCALC 130 (H) 12/12/2021 0104   LDLCALC 130 (H) 03/05/2021 1653   LDLDIRECT 169 (H) 07/08/2011 0944    Home Medications   Current Meds  Medication Sig   amiodarone (PACERONE) 200 MG tablet Take 1 tablet (200 mg total) by mouth 2 (two) times daily.   aspirin EC 81 MG tablet Take 81 mg by mouth daily.   atorvastatin (LIPITOR) 80 MG tablet Take 1 tablet (80 mg total) by mouth daily.   carvedilol (COREG) 3.125 MG tablet Take 1 tablet (3.125 mg total) by mouth 2 (two) times daily with a meal.   clopidogrel (PLAVIX) 75 MG tablet Take 1 tablet by mouth once daily   glipiZIDE (GLUCOTROL) 5 MG tablet Take 1 tablet (5 mg total) by mouth daily.   HYDROcodone-acetaminophen (NORCO/VICODIN) 5-325 MG tablet Take 1-2 tablets by mouth every 8 (eight) hours as needed for moderate pain.   nitroGLYCERIN (NITROSTAT) 0.4 MG SL tablet Place 1 tablet (0.4 mg total) under the tongue every 5 (five) minutes as needed.   pantoprazole (PROTONIX) 40 MG tablet TAKE ONE TABLET BY  MOUTH DAILY   sertraline (ZOLOFT) 100 MG tablet Take 2 tablets (200 mg total) by mouth daily.   sildenafil (VIAGRA) 50 MG tablet TAKE ONE TABLET BY MOUTH DAILY AS NEEDED FOR ERECTILE DYSFUNCTION   [DISCONTINUED] dapagliflozin propanediol (FARXIGA) 10 MG TABS tablet Take 1 tablet by mouth once daily     Review of Systems      All other systems reviewed and are otherwise negative except as noted above.  Physical Exam    VS:  BP 104/74 Comment: laying down  Pulse 78   Ht '5\' 7"'$  (1.702 m)   Wt 153 lb (69.4 kg)   BMI 23.96 kg/m  , BMI Body mass index is 23.96 kg/m.  Wt Readings from Last 3 Encounters:  02/10/22 153 lb (69.4 kg)  01/28/22 148 lb 9.6 oz (67.4 kg)  01/20/22 148 lb 3.2 oz (67.2 kg)    GEN: Well nourished, well developed, in no acute distress. HEENT: normal. Neck: Supple, no JVD, carotid bruits, or masses. Cardiac: RRR, no murmurs, rubs, or gallops. No clubbing, cyanosis, edema.  Radials/PT 2+ and equal bilaterally.  Respiratory:  Respirations regular and unlabored, clear to auscultation bilaterally. GI: Soft, nontender, nondistended. MS: No deformity or atrophy. Skin: Warm and dry, no rash. Neuro:  Strength and sensation are intact. Psych: Normal affect.  Assessment & Plan    CAD -prior stent to LAD and LCx.  11/2021 LHC with 99% mid RCA, 100% mid to distal RCA, patent stents LAD and circumflex, 90% ostial to proximal  LAD with PCI to mid LAD.  Intolerant of nitrates. Indefinite DAPT Aspirin/Plavix. GDMT includes Plavix, Atorvastatin, Aspirin, Coreg. Participating in [facility] PT/RN services.Unable to drive to get to cardiac rehab.   ICM s/p ICD -most recent echo 11/2021 LVEF 20 to 25%, multiple wall motion abilities, RV mildly reducein setting of VT.  Weight up 5 pounds since clinic visit earlier this month. However, no edema or worsening dypnea. 01/28/22 BNP 40.2. NYHA II/III.  Volume status difficult due to hypotension. Entresto and Eplerenone have been discontinued  previously. Continued episodes of symptomatic hypotension. Orthostatic vitals negative. Reduce Farxiga to '5mg'$  QD.  Low sodium diet, fluid restriction <2L, and daily weights encouraged. Educated to contact our office for weight gain of 2 lbs overnight or 5 lbs in one week.  Upcoming follow-up with heart failure clinic APP 02/13/2022 Upcoming echo 03/02/2022 to reassess LVEF.  VT  s/p ICD- Follows with EP. Continue Amiodarone. Denies adverse effects. 12/30/21 TSH 0.86, T3 1.8, T4 1.7, AST 33, ALT 45.  HLD - Continue Atorvastatin '80mg'$  QD. Unable to afford Repatha. Lipid lowering diet encouraged.   DM2 - 12/11/21 A1c 7.1. Continue to follow with PCP.    Disposition: Follow up as scheduled with the [facility].   Signed, Loel Dubonnet, NP 02/10/2022, 10:35 AM [place]

## 2022-02-10 ENCOUNTER — Encounter (HOSPITAL_BASED_OUTPATIENT_CLINIC_OR_DEPARTMENT_OTHER): Payer: Self-pay | Admitting: Family

## 2022-02-10 ENCOUNTER — Ambulatory Visit (INDEPENDENT_AMBULATORY_CARE_PROVIDER_SITE_OTHER): Payer: BC Managed Care – PPO | Admitting: Family

## 2022-02-10 VITALS — BP 104/74 | HR 78 | Ht 67.0 in | Wt 153.0 lb

## 2022-02-10 DIAGNOSIS — E118 Type 2 diabetes mellitus with unspecified complications: Secondary | ICD-10-CM | POA: Diagnosis not present

## 2022-02-10 DIAGNOSIS — I25118 Atherosclerotic heart disease of native coronary artery with other forms of angina pectoris: Secondary | ICD-10-CM | POA: Diagnosis not present

## 2022-02-10 DIAGNOSIS — E785 Hyperlipidemia, unspecified: Secondary | ICD-10-CM

## 2022-02-10 DIAGNOSIS — I5022 Chronic systolic (congestive) heart failure: Secondary | ICD-10-CM | POA: Diagnosis not present

## 2022-02-10 MED ORDER — DAPAGLIFLOZIN PROPANEDIOL 5 MG PO TABS: 5.0000 mg | ORAL_TABLET | Freq: Every day | ORAL | 3 refills | Status: DC

## 2022-02-10 NOTE — Patient Instructions (Signed)
Medication Instructions:  Your physician has recommended you make the following change in your medication:   Change: Farxiga '5mg'$  tablet. You may take a half tablet of your '10mg'$  tablets to use them up and then pick up your new prescription.   *If you need a refill on your cardiac medications before your next appointment, please call your pharmacy*   Lab Work: None ordered today   Testing/Procedures: None ordered today   Follow-Up: At [facility], you and your health needs are our priority.  As part of our continuing mission to provide you with exceptional heart care, we have created designated Provider Care Teams.  These Care Teams include your primary Cardiologist (physician) and Advanced Practice Providers (APPs -  Physician Assistants and Nurse Practitioners) who all work together to provide you with the care you need, when you need it.  We recommend signing up for the patient portal called "MyChart".  Sign up information is provided on this After Visit Summary.  MyChart is used to connect with patients for Virtual Visits (Telemedicine).  Patients are able to view lab/test results, encounter notes, upcoming appointments, etc.  Non-urgent messages can be sent to your provider as well.   To learn more about what you can do with MyChart, go to [email or web address].    Your next appointment:   Follow up as scheduled with [facility]  Other Instructions Heart Healthy Diet Recommendations: A low-salt diet is recommended. Meats should be grilled, baked, or boiled. Avoid fried foods. Focus on lean protein sources like fish or chicken with vegetables and fruits. The American Heart Association is a Microbiologist!  American Heart Association Diet and Lifeystyle Recommendations   Exercise recommendations: The American Heart Association recommends 150 minutes of moderate intensity exercise weekly. Try 30 minutes of moderate intensity exercise 4-5 times per week. This  could include walking, jogging, or swimming.   Important Information About Sugar

## 2022-02-10 NOTE — Progress Notes (Signed)
SUBJECTIVE:   CHIEF COMPLAINT / HPI:   "Discuss getting colonoscopy/endoscopy": 56 year old male presenting for the above.  He does not have colonoscopy on file.  Denies black or bright red blood per rectum.  He previously messaged me on 02/04/2022 stating that he had found that his sister had stage IV stomach cancer and was interested in possibly getting a colonoscopy or endoscopy if appropriate.  He states that he found out what his sister has is actually pancreatic cancer. She is 56 years old. With regard to reflux or GERD he states he has a history of barrett's esophagus. He states as long as he takes his medication he has no issues with reflux currently. He has no difficulty with swallowing foods but states it has been 10 years since he has had a endoscopy and he states he was told to have one every 5 years.   Right arm pain: Present for a few days. It feels like a light touch burning similar to if he was bitten by an insect but he hasn't noticed a bite. Hurts worse with light touch than deep pressure. No fevers or rash.   PERTINENT  PMH / PSH: History of type 2 diabetes  OBJECTIVE:   BP 119/72   Pulse 90   Ht '5\' 7"'$  (1.702 m)   Wt 153 lb 6.4 oz (69.6 kg)   SpO2 94%   BMI 24.03 kg/m    General: NAD, pleasant, able to participate in exam Cardiac: S1, S2 present with no murmurs Respiratory: CTAB, normal effort, No wheezes, rales or rhonchi Abdomen: Bowel sounds present, nontender Skin/MSK: Right arm with no break in the skin, no rash, no visual abnormality in the region of his pain.  I do not see any signs of an insect bite.  The pain is localized to a 4 x 2 cm region on the underarm of his right arm in the region of the medial aspect of the tricep.  This pain does not radiate on palpation and hurts worse with superficial touch than with deep touch.  He has normal strength and neurovascular testing of both upper extremities Psych: Normal affect and mood  ASSESSMENT/PLAN:    Passive SI  positive question #9 on PHQ: Patient states that he has felt this way for a long time.  No active thoughts or active plans.  States that he is at his baseline.  I provided resources and recommended that if he has any changes or any actual plans of harming himself please reach out.  Right arm pain: This seems to be superficial and possibly due to an insect bite.  I do not see any break in the skin in the region of his pain but because his pain hurts more superficially than with deep palpation and is a burning sensation in 1 small region of the underarm with no rash or overlying skin changes suspect this may be due to an insect bite or sting.  It does not radiate down the arm and he has normal strength testing.  Screen for colon cancer-we will refer for GI  History of Barrett's esophagus  We will refer for GI as patient has not had an EGD in about 10 years per his estimation and was recommended to have 1 every 5 years.  States his reflux is well controlled right now with his medications.  Denies difficulty swallowing foods, unintentional weight loss, or other issues with regard to this.  [name], [place]

## 2022-02-11 ENCOUNTER — Encounter: Payer: Self-pay | Admitting: Family Medicine

## 2022-02-11 ENCOUNTER — Telehealth: Payer: Self-pay

## 2022-02-11 ENCOUNTER — Ambulatory Visit (INDEPENDENT_AMBULATORY_CARE_PROVIDER_SITE_OTHER): Payer: BC Managed Care – PPO | Admitting: Family Medicine

## 2022-02-11 VITALS — BP 119/72 | HR 90 | Ht 67.0 in | Wt 153.4 lb

## 2022-02-11 DIAGNOSIS — E119 Type 2 diabetes mellitus without complications: Secondary | ICD-10-CM | POA: Diagnosis not present

## 2022-02-11 DIAGNOSIS — K22719 Barrett's esophagus with dysplasia, unspecified: Secondary | ICD-10-CM | POA: Diagnosis not present

## 2022-02-11 DIAGNOSIS — Z1211 Encounter for screening for malignant neoplasm of colon: Secondary | ICD-10-CM | POA: Diagnosis not present

## 2022-02-11 MED ORDER — GLIPIZIDE 5 MG PO TABS: 5.0000 mg | ORAL_TABLET | Freq: Every day | ORAL | 3 refills | Status: AC

## 2022-02-11 NOTE — Telephone Encounter (Signed)
Spoke with patient regarding VT episode successfully treated with ATP x 1  from 02/05/22 at 1924 patient stated that he was taking Amiodarone '200mg'$  BID informed patient that I would forward over to heart failure clinic as [facility]

## 2022-02-11 NOTE — Patient Instructions (Signed)
I have given you a refill of your glipizide.  I have placed a referral for your GI doctor in order to have your discussion about endoscopy and/or colonoscopy.  For the spot on your arm that is currently hurting I think this is likely due to a bug bite or other superficial injury.  Anticipate that it will be feeling better in the next few days.  You could use some capsaicin which you can get over-the-counter versus IcyHot versus some liquid Benadryl on the area.  If it starts getting worse, if you see a rash in the area, or if you have other concerns please let us know.  If is not better in the next 1 week please let us know.

## 2022-02-12 DIAGNOSIS — G8929 Other chronic pain: Secondary | ICD-10-CM | POA: Diagnosis not present

## 2022-02-12 DIAGNOSIS — I255 Ischemic cardiomyopathy: Secondary | ICD-10-CM | POA: Diagnosis not present

## 2022-02-12 DIAGNOSIS — I25118 Atherosclerotic heart disease of native coronary artery with other forms of angina pectoris: Secondary | ICD-10-CM | POA: Diagnosis not present

## 2022-02-12 DIAGNOSIS — M47815 Spondylosis without myelopathy or radiculopathy, thoracolumbar region: Secondary | ICD-10-CM | POA: Diagnosis not present

## 2022-02-12 DIAGNOSIS — I5022 Chronic systolic (congestive) heart failure: Secondary | ICD-10-CM | POA: Diagnosis not present

## 2022-02-12 DIAGNOSIS — R55 Syncope and collapse: Secondary | ICD-10-CM | POA: Diagnosis not present

## 2022-02-12 DIAGNOSIS — E119 Type 2 diabetes mellitus without complications: Secondary | ICD-10-CM | POA: Diagnosis not present

## 2022-02-12 DIAGNOSIS — F32A Depression, unspecified: Secondary | ICD-10-CM | POA: Diagnosis not present

## 2022-02-12 DIAGNOSIS — E785 Hyperlipidemia, unspecified: Secondary | ICD-10-CM | POA: Diagnosis not present

## 2022-02-12 DIAGNOSIS — K2270 Barrett's esophagus without dysplasia: Secondary | ICD-10-CM | POA: Diagnosis not present

## 2022-02-12 DIAGNOSIS — I472 Ventricular tachycardia, unspecified: Secondary | ICD-10-CM | POA: Diagnosis not present

## 2022-02-12 DIAGNOSIS — F419 Anxiety disorder, unspecified: Secondary | ICD-10-CM | POA: Diagnosis not present

## 2022-02-12 DIAGNOSIS — R5383 Other fatigue: Secondary | ICD-10-CM | POA: Diagnosis not present

## 2022-02-12 DIAGNOSIS — I110 Hypertensive heart disease with heart failure: Secondary | ICD-10-CM | POA: Diagnosis not present

## 2022-02-12 DIAGNOSIS — M1712 Unilateral primary osteoarthritis, left knee: Secondary | ICD-10-CM | POA: Diagnosis not present

## 2022-02-12 DIAGNOSIS — G4733 Obstructive sleep apnea (adult) (pediatric): Secondary | ICD-10-CM | POA: Diagnosis not present
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
## 2022-02-13 ENCOUNTER — Telehealth: Payer: Self-pay | Admitting: Cardiology

## 2022-02-13 ENCOUNTER — Ambulatory Visit (HOSPITAL_COMMUNITY)
Admission: RE | Admit: 2022-02-13 | Discharge: 2022-02-13 | Disposition: A | Discharge status: Home / Self Care | Payer: BC Managed Care – PPO | Source: Ambulatory Visit | Attending: Adult Health | Admitting: Adult Health

## 2022-02-13 ENCOUNTER — Encounter (HOSPITAL_COMMUNITY): Payer: Self-pay

## 2022-02-13 VITALS — BP 104/70 | HR 74 | Wt 152.0 lb

## 2022-02-13 DIAGNOSIS — E896 Postprocedural adrenocortical (-medullary) hypofunction: Secondary | ICD-10-CM | POA: Diagnosis not present

## 2022-02-13 DIAGNOSIS — I255 Ischemic cardiomyopathy: Secondary | ICD-10-CM | POA: Insufficient documentation

## 2022-02-13 DIAGNOSIS — Z79899 Other long term (current) drug therapy: Secondary | ICD-10-CM | POA: Insufficient documentation

## 2022-02-13 DIAGNOSIS — Z95810 Presence of automatic (implantable) cardiac defibrillator: Secondary | ICD-10-CM | POA: Diagnosis not present

## 2022-02-13 DIAGNOSIS — I5022 Chronic systolic (congestive) heart failure: Secondary | ICD-10-CM

## 2022-02-13 DIAGNOSIS — E785 Hyperlipidemia, unspecified: Secondary | ICD-10-CM | POA: Insufficient documentation

## 2022-02-13 DIAGNOSIS — I25118 Atherosclerotic heart disease of native coronary artery with other forms of angina pectoris: Secondary | ICD-10-CM | POA: Diagnosis not present

## 2022-02-13 DIAGNOSIS — Z7984 Long term (current) use of oral hypoglycemic drugs: Secondary | ICD-10-CM | POA: Diagnosis not present

## 2022-02-13 DIAGNOSIS — I110 Hypertensive heart disease with heart failure: Secondary | ICD-10-CM | POA: Insufficient documentation

## 2022-02-13 DIAGNOSIS — Z955 Presence of coronary angioplasty implant and graft: Secondary | ICD-10-CM | POA: Insufficient documentation

## 2022-02-13 DIAGNOSIS — Z7902 Long term (current) use of antithrombotics/antiplatelets: Secondary | ICD-10-CM | POA: Diagnosis not present

## 2022-02-13 DIAGNOSIS — Z8719 Personal history of other diseases of the digestive system: Secondary | ICD-10-CM | POA: Diagnosis not present

## 2022-02-13 DIAGNOSIS — Z7982 Long term (current) use of aspirin: Secondary | ICD-10-CM | POA: Diagnosis not present

## 2022-02-13 DIAGNOSIS — R0789 Other chest pain: Secondary | ICD-10-CM | POA: Insufficient documentation

## 2022-02-13 DIAGNOSIS — G4733 Obstructive sleep apnea (adult) (pediatric): Secondary | ICD-10-CM | POA: Diagnosis not present

## 2022-02-13 DIAGNOSIS — I472 Ventricular tachycardia, unspecified: Secondary | ICD-10-CM | POA: Diagnosis not present

## 2022-02-13 DIAGNOSIS — R5383 Other fatigue: Secondary | ICD-10-CM

## 2022-02-13 DIAGNOSIS — E119 Type 2 diabetes mellitus without complications: Secondary | ICD-10-CM | POA: Diagnosis not present

## 2022-02-13 DIAGNOSIS — I2510 Atherosclerotic heart disease of native coronary artery without angina pectoris: Secondary | ICD-10-CM | POA: Insufficient documentation

## 2022-02-13 DIAGNOSIS — R0602 Shortness of breath: Secondary | ICD-10-CM | POA: Diagnosis not present

## 2022-02-13 LAB — BASIC METABOLIC PANEL
Anion gap: 7 (ref 5–15)
BUN: 15 mg/dL (ref 6–20)
CO2: 24 mmol/L (ref 22–32)
Calcium: 8.7 mg/dL — ABNORMAL LOW (ref 8.9–10.3)
Chloride: 108 mmol/L (ref 98–111)
Creatinine, Ser: 0.99 mg/dL (ref 0.61–1.24)
GFR, Estimated: >60 mL/min (ref >60)
Glucose, Bld: 236 mg/dL — ABNORMAL HIGH (ref 70–99)
Potassium: 4.5 mmol/L (ref 3.5–5.1)
Sodium: 139 mmol/L (ref 135–145)

## 2022-02-13 LAB — MAGNESIUM: Magnesium: 2.4 mg/dL (ref 1.7–2.4)

## 2022-02-13 NOTE — Telephone Encounter (Signed)
New Message:     Patient [facility] this [facility]. They told him to contact Dr Martinique so that his FMLA can be extended.

## 2022-02-13 NOTE — Patient Instructions (Addendum)
EKG done today.  Labs done today. We will contact you only if your labs are abnormal.  No medication changes were made. Please continue all current medications as prescribed.  Your physician has recommended that you have a cardiopulmonary stress test (CPX). CPX testing is a non-invasive measurement of heart and lung function. It replaces a traditional treadmill stress test. This type of test provides a tremendous amount of information that relates not only to your present condition but also for future outcomes. This test combines measurements of you ventilation, respiratory gas exchange in the lungs, electrocardiogram (EKG), blood pressure and physical response before, during, and following an exercise protocol.   Your physician recommends that you keep your scheduled follow-up appointment in July with Dr. Haroldine Laws here in our office.   If you have any questions or concerns before your next appointment please send [place] a message through [place] or call our office at [phone].    TO LEAVE A MESSAGE FOR THE NURSE SELECT OPTION 2, PLEASE LEAVE A MESSAGE INCLUDING: YOUR NAME DATE OF BIRTH CALL BACK NUMBER REASON FOR CALL**this is important as we prioritize the call backs  YOU WILL RECEIVE A CALL BACK THE SAME DAY AS LONG AS YOU CALL BEFORE 4:00 PM   Do the following things EVERYDAY: Weigh yourself in the morning before breakfast. Write it down and keep it in a log. Take your medicines as prescribed Eat low salt foods--Limit salt (sodium) to 2000 mg per day.  Stay as active as you can everyday Limit all fluids for the day to less than 2 liters   At the [facility], you and your health needs are our priority. As part of our continuing mission to provide you with exceptional heart care, we have created designated Provider Care Teams. These Care Teams include your primary Cardiologist (physician) and Advanced Practice Providers (APPs- Physician Assistants and Nurse  Practitioners) who all work together to provide you with the care you need, when you need it.   You may see any of the following providers on your designated Care Team at your next follow up: Dr Glori Bickers Dr Haynes Kerns, NP Lyda Jester, [place] Audry Riles, PharmD   Please be sure to bring in all your medications bottles to every appointment.

## 2022-02-13 NOTE — Telephone Encounter (Signed)
Per pt paperwork is being sent to the ATTN of Dr Martinique  will await paperwork and fill out accordingly./cy

## 2022-02-14 DIAGNOSIS — M1712 Unilateral primary osteoarthritis, left knee: Secondary | ICD-10-CM | POA: Diagnosis not present

## 2022-02-14 DIAGNOSIS — E119 Type 2 diabetes mellitus without complications: Secondary | ICD-10-CM | POA: Diagnosis not present

## 2022-02-14 DIAGNOSIS — I255 Ischemic cardiomyopathy: Secondary | ICD-10-CM | POA: Diagnosis not present

## 2022-02-14 DIAGNOSIS — R55 Syncope and collapse: Secondary | ICD-10-CM | POA: Diagnosis not present

## 2022-02-14 DIAGNOSIS — F32A Depression, unspecified: Secondary | ICD-10-CM | POA: Diagnosis not present

## 2022-02-14 DIAGNOSIS — I5022 Chronic systolic (congestive) heart failure: Secondary | ICD-10-CM | POA: Diagnosis not present

## 2022-02-14 DIAGNOSIS — K2270 Barrett's esophagus without dysplasia: Secondary | ICD-10-CM | POA: Diagnosis not present

## 2022-02-14 DIAGNOSIS — G4733 Obstructive sleep apnea (adult) (pediatric): Secondary | ICD-10-CM | POA: Diagnosis not present

## 2022-02-14 DIAGNOSIS — I110 Hypertensive heart disease with heart failure: Secondary | ICD-10-CM | POA: Diagnosis not present

## 2022-02-14 DIAGNOSIS — R5383 Other fatigue: Secondary | ICD-10-CM | POA: Diagnosis not present

## 2022-02-14 DIAGNOSIS — I472 Ventricular tachycardia, unspecified: Secondary | ICD-10-CM | POA: Diagnosis not present

## 2022-02-14 DIAGNOSIS — I25118 Atherosclerotic heart disease of native coronary artery with other forms of angina pectoris: Secondary | ICD-10-CM | POA: Diagnosis not present

## 2022-02-14 DIAGNOSIS — G8929 Other chronic pain: Secondary | ICD-10-CM | POA: Diagnosis not present

## 2022-02-14 DIAGNOSIS — E785 Hyperlipidemia, unspecified: Secondary | ICD-10-CM | POA: Diagnosis not present

## 2022-02-14 DIAGNOSIS — F419 Anxiety disorder, unspecified: Secondary | ICD-10-CM | POA: Diagnosis not present

## 2022-02-14 DIAGNOSIS — M47815 Spondylosis without myelopathy or radiculopathy, thoracolumbar region: Secondary | ICD-10-CM | POA: Diagnosis not present

## 2022-02-17 DIAGNOSIS — I110 Hypertensive heart disease with heart failure: Secondary | ICD-10-CM | POA: Diagnosis not present

## 2022-02-17 DIAGNOSIS — M1712 Unilateral primary osteoarthritis, left knee: Secondary | ICD-10-CM | POA: Diagnosis not present

## 2022-02-17 DIAGNOSIS — R5383 Other fatigue: Secondary | ICD-10-CM | POA: Diagnosis not present

## 2022-02-17 DIAGNOSIS — E785 Hyperlipidemia, unspecified: Secondary | ICD-10-CM | POA: Diagnosis not present

## 2022-02-17 DIAGNOSIS — I472 Ventricular tachycardia, unspecified: Secondary | ICD-10-CM | POA: Diagnosis not present

## 2022-02-17 DIAGNOSIS — G4733 Obstructive sleep apnea (adult) (pediatric): Secondary | ICD-10-CM | POA: Diagnosis not present

## 2022-02-17 DIAGNOSIS — R55 Syncope and collapse: Secondary | ICD-10-CM | POA: Diagnosis not present

## 2022-02-17 DIAGNOSIS — I255 Ischemic cardiomyopathy: Secondary | ICD-10-CM | POA: Diagnosis not present

## 2022-02-17 DIAGNOSIS — I5022 Chronic systolic (congestive) heart failure: Secondary | ICD-10-CM | POA: Diagnosis not present

## 2022-02-17 DIAGNOSIS — M47815 Spondylosis without myelopathy or radiculopathy, thoracolumbar region: Secondary | ICD-10-CM | POA: Diagnosis not present

## 2022-02-17 DIAGNOSIS — G8929 Other chronic pain: Secondary | ICD-10-CM | POA: Diagnosis not present

## 2022-02-17 DIAGNOSIS — K2270 Barrett's esophagus without dysplasia: Secondary | ICD-10-CM | POA: Diagnosis not present

## 2022-02-17 DIAGNOSIS — E119 Type 2 diabetes mellitus without complications: Secondary | ICD-10-CM | POA: Diagnosis not present

## 2022-02-17 DIAGNOSIS — F32A Depression, unspecified: Secondary | ICD-10-CM | POA: Diagnosis not present

## 2022-02-17 DIAGNOSIS — F419 Anxiety disorder, unspecified: Secondary | ICD-10-CM | POA: Diagnosis not present

## 2022-02-17 DIAGNOSIS — I25118 Atherosclerotic heart disease of native coronary artery with other forms of angina pectoris: Secondary | ICD-10-CM | POA: Diagnosis not present

## 2022-02-19 ENCOUNTER — Other Ambulatory Visit (HOSPITAL_COMMUNITY): Payer: Self-pay

## 2022-02-19 DIAGNOSIS — I5022 Chronic systolic (congestive) heart failure: Secondary | ICD-10-CM

## 2022-02-19 NOTE — Progress Notes (Signed)
Orders Placed This Encounter  Procedures   Cardiopulmonary exercise test    Standing Status:   Future    Standing Expiration Date:   02/20/2023    Order Specific Question:   Where should this test be performed?    Answer:   [facility]

## 2022-02-21 DIAGNOSIS — I25118 Atherosclerotic heart disease of native coronary artery with other forms of angina pectoris: Secondary | ICD-10-CM | POA: Diagnosis not present

## 2022-02-21 DIAGNOSIS — R5383 Other fatigue: Secondary | ICD-10-CM | POA: Diagnosis not present

## 2022-02-21 DIAGNOSIS — M1712 Unilateral primary osteoarthritis, left knee: Secondary | ICD-10-CM | POA: Diagnosis not present

## 2022-02-21 DIAGNOSIS — I472 Ventricular tachycardia, unspecified: Secondary | ICD-10-CM | POA: Diagnosis not present

## 2022-02-21 DIAGNOSIS — I110 Hypertensive heart disease with heart failure: Secondary | ICD-10-CM | POA: Diagnosis not present

## 2022-02-21 DIAGNOSIS — E119 Type 2 diabetes mellitus without complications: Secondary | ICD-10-CM | POA: Diagnosis not present

## 2022-02-21 DIAGNOSIS — R55 Syncope and collapse: Secondary | ICD-10-CM | POA: Diagnosis not present

## 2022-02-21 DIAGNOSIS — E785 Hyperlipidemia, unspecified: Secondary | ICD-10-CM | POA: Diagnosis not present

## 2022-02-21 DIAGNOSIS — G8929 Other chronic pain: Secondary | ICD-10-CM | POA: Diagnosis not present

## 2022-02-21 DIAGNOSIS — F419 Anxiety disorder, unspecified: Secondary | ICD-10-CM | POA: Diagnosis not present

## 2022-02-21 DIAGNOSIS — I255 Ischemic cardiomyopathy: Secondary | ICD-10-CM | POA: Diagnosis not present

## 2022-02-21 DIAGNOSIS — M47815 Spondylosis without myelopathy or radiculopathy, thoracolumbar region: Secondary | ICD-10-CM | POA: Diagnosis not present

## 2022-02-21 DIAGNOSIS — F32A Depression, unspecified: Secondary | ICD-10-CM | POA: Diagnosis not present

## 2022-02-21 DIAGNOSIS — K2270 Barrett's esophagus without dysplasia: Secondary | ICD-10-CM | POA: Diagnosis not present

## 2022-02-21 DIAGNOSIS — G4733 Obstructive sleep apnea (adult) (pediatric): Secondary | ICD-10-CM | POA: Diagnosis not present

## 2022-02-21 DIAGNOSIS — I5022 Chronic systolic (congestive) heart failure: Secondary | ICD-10-CM | POA: Diagnosis not present

## 2022-02-25 ENCOUNTER — Other Ambulatory Visit: Payer: Self-pay | Admitting: Gastroenterology

## 2022-02-25 DIAGNOSIS — K2270 Barrett's esophagus without dysplasia: Secondary | ICD-10-CM | POA: Diagnosis not present

## 2022-02-25 DIAGNOSIS — R1310 Dysphagia, unspecified: Secondary | ICD-10-CM | POA: Diagnosis not present

## 2022-03-03 ENCOUNTER — Other Ambulatory Visit: Payer: Self-pay | Admitting: Internal Medicine

## 2022-03-03 ENCOUNTER — Ambulatory Visit (HOSPITAL_COMMUNITY): Payer: BC Managed Care – PPO | Attending: Cardiology

## 2022-03-03 DIAGNOSIS — I5022 Chronic systolic (congestive) heart failure: Secondary | ICD-10-CM | POA: Diagnosis not present

## 2022-03-03 LAB — ECHOCARDIOGRAM COMPLETE
Area-P 1/2: 5.02 cm2
Calc EF: 42.6 %
S' Lateral: 4.5 cm
Single Plane A2C EF: 39.2 %
Single Plane A4C EF: 45.7 %

## 2022-03-05 ENCOUNTER — Other Ambulatory Visit: Payer: Self-pay | Admitting: Family Medicine

## 2022-03-05 DIAGNOSIS — G894 Chronic pain syndrome: Secondary | ICD-10-CM

## 2022-03-06 ENCOUNTER — Other Ambulatory Visit: Payer: Self-pay | Admitting: Family Medicine

## 2022-03-06 DIAGNOSIS — G894 Chronic pain syndrome: Secondary | ICD-10-CM

## 2022-03-06 NOTE — Telephone Encounter (Signed)
Spoke to patient he stated he needs his FMLA paperwork completed.Stated his last day of work 12/10/21.He was told he cannot return to work.Advised Dr.Jordan is out of office this week.I will speak to him next week and call you back.

## 2022-03-07 ENCOUNTER — Encounter: Payer: Self-pay | Admitting: Family Medicine

## 2022-03-07 MED ORDER — HYDROCODONE-ACETAMINOPHEN 5-325 MG PO TABS: 1.0000 | ORAL_TABLET | Freq: Three times a day (TID) | ORAL | 0 refills | Status: DC | PRN

## 2022-03-10 ENCOUNTER — Other Ambulatory Visit: Payer: BC Managed Care – PPO

## 2022-03-10 NOTE — Telephone Encounter (Signed)
Spoke to patient FMLA paperwork completed.Faxed to [employer] disability at fax # [phone].

## 2022-03-11 ENCOUNTER — Telehealth (HOSPITAL_BASED_OUTPATIENT_CLINIC_OR_DEPARTMENT_OTHER): Payer: Self-pay

## 2022-03-11 ENCOUNTER — Other Ambulatory Visit: Payer: Self-pay

## 2022-03-11 MED ORDER — STEGLATRO 5 MG PO TABS: 5.0000 mg | ORAL_TABLET | Freq: Every day | ORAL | 6 refills | Status: DC

## 2022-03-11 NOTE — Telephone Encounter (Signed)
Steglatro 5 mg sent to pt pharmacy. Farxiga discontinued. OK per Dr. Martinique.

## 2022-03-11 NOTE — Telephone Encounter (Signed)
-----   Message from Peter M Martinique, MD sent at 03/11/2022 10:44 AM EDT ----- Yes OK to switch to Steglatro 5 mg daily  Peter Martinique MD, [facility]   ----- Message ----- From: Francella Solian, RMA Sent: 03/11/2022  10:08 AM EDT To: Peter M Martinique, MD; Luanna Salk, LPN  Pharmacy requesting switch from [place] to [facility] due to cost.

## 2022-03-11 NOTE — Telephone Encounter (Signed)
See previous 03/10/22 telephone note.

## 2022-03-13 ENCOUNTER — Ambulatory Visit
Admission: RE | Admit: 2022-03-13 | Discharge: 2022-03-13 | Disposition: A | Discharge status: Home / Self Care | Payer: BC Managed Care – PPO | Source: Ambulatory Visit | Attending: Gastroenterology | Admitting: Gastroenterology

## 2022-03-13 DIAGNOSIS — K219 Gastro-esophageal reflux disease without esophagitis: Secondary | ICD-10-CM | POA: Diagnosis not present

## 2022-03-13 DIAGNOSIS — R1310 Dysphagia, unspecified: Secondary | ICD-10-CM

## 2022-03-13 DIAGNOSIS — K224 Dyskinesia of esophagus: Secondary | ICD-10-CM | POA: Diagnosis not present

## 2022-03-17 ENCOUNTER — Telehealth: Payer: Self-pay | Admitting: Cardiology

## 2022-03-17 ENCOUNTER — Encounter (HOSPITAL_COMMUNITY): Payer: BC Managed Care – PPO | Admitting: Internal Medicine

## 2022-03-17 ENCOUNTER — Encounter: Payer: Self-pay | Admitting: Cardiology

## 2022-03-17 ENCOUNTER — Ambulatory Visit (INDEPENDENT_AMBULATORY_CARE_PROVIDER_SITE_OTHER): Payer: BC Managed Care – PPO

## 2022-03-17 ENCOUNTER — Ambulatory Visit (INDEPENDENT_AMBULATORY_CARE_PROVIDER_SITE_OTHER): Payer: BC Managed Care – PPO | Admitting: Cardiology

## 2022-03-17 VITALS — BP 108/76 | HR 78 | Ht 67.0 in | Wt 149.6 lb

## 2022-03-17 DIAGNOSIS — I472 Ventricular tachycardia, unspecified: Secondary | ICD-10-CM

## 2022-03-17 DIAGNOSIS — I25118 Atherosclerotic heart disease of native coronary artery with other forms of angina pectoris: Secondary | ICD-10-CM

## 2022-03-17 DIAGNOSIS — I5022 Chronic systolic (congestive) heart failure: Secondary | ICD-10-CM

## 2022-03-17 DIAGNOSIS — I10 Essential (primary) hypertension: Secondary | ICD-10-CM | POA: Diagnosis not present

## 2022-03-17 DIAGNOSIS — Z95810 Presence of automatic (implantable) cardiac defibrillator: Secondary | ICD-10-CM

## 2022-03-17 MED ORDER — AMIODARONE HCL 200 MG PO TABS
200.0000 mg | ORAL_TABLET | Freq: Every day | ORAL | 3 refills | Status: DC
Start: 2022-03-17 — End: 2022-03-26

## 2022-03-17 NOTE — Patient Instructions (Addendum)
Medication Instructions:  Reduce Amiodarone to 200 mg daily Your physician recommends that you continue on your current medications as directed. Please refer to the Current Medication list given to you today. *If you need a refill on your cardiac medications before your next appointment, please call your pharmacy*  Lab Work: CMP, TSH, Free T4 If you have labs (blood work) drawn today and your tests are completely normal, you will receive your results only by: [place] (if you have MyChart) OR A paper copy in the mail If you have any lab test that is abnormal or we need to change your treatment, we will call you to review the results.  Testing/Procedures: None.  Follow-Up: At [facility], you and your health needs are our priority.  As part of our continuing mission to provide you with exceptional heart care, we have created designated Provider Care Teams.  These Care Teams include your primary Cardiologist (physician) and Advanced Practice Providers (APPs -  Physician Assistants and Nurse Practitioners) who all work together to provide you with the care you need, when you need it.  Your physician wants you to follow-up in: 4 months with  one of the following Advanced Practice Providers on your designated Care Team:    Tommye Standard, [place] Legrand Como "[facility]" [place], [place] .  We recommend signing up for the patient portal called "MyChart".  Sign up information is provided on this After Visit Summary.  MyChart is used to connect with patients for Virtual Visits (Telemedicine).  Patients are able to view lab/test results, encounter notes, upcoming appointments, etc.  Non-urgent messages can be sent to your provider as well.   To learn more about what you can do with MyChart, go to [email or web address].    Any Other Special Instructions Will Be Listed Below (If Applicable).

## 2022-03-17 NOTE — Telephone Encounter (Signed)
Pt states that requesting office did not receive FMLA paperwork. Pt provided an alternate fax number. Please advise  [phone]
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
## 2022-03-17 NOTE — Telephone Encounter (Signed)
FMLA paperwork re faxed to [employer] at fax # [phone].

## 2022-03-18 ENCOUNTER — Other Ambulatory Visit: Payer: Self-pay

## 2022-03-18 DIAGNOSIS — F332 Major depressive disorder, recurrent severe without psychotic features: Secondary | ICD-10-CM

## 2022-03-18 LAB — CUP PACEART REMOTE DEVICE CHECK
Battery Remaining Longevity: 180 mo
Battery Remaining Percentage: 100 %
Brady Statistic RV Percent Paced: 0 %
Date Time Interrogation Session: 20230717042100
HighPow Impedance: 73 Ohm
Implantable Lead Implant Date: 20230414
Implantable Lead Location: 753860
Implantable Lead Model: 672
Implantable Lead Serial Number: 182871
Implantable Pulse Generator Implant Date: 20230414
Lead Channel Impedance Value: 787 Ohm
Lead Channel Pacing Threshold Amplitude: 0.8 V
Lead Channel Pacing Threshold Pulse Width: 0.4 ms
Lead Channel Setting Pacing Amplitude: 3.5 V
Lead Channel Setting Pacing Pulse Width: 0.4 ms
Lead Channel Setting Sensing Sensitivity: 0.5 mV
Pulse Gen Serial Number: 310848

## 2022-03-18 LAB — COMPREHENSIVE METABOLIC PANEL
ALT: 18 IU/L (ref 0–44)
AST: 17 IU/L (ref 0–40)
Albumin/Globulin Ratio: 1.5 (ref 1.2–2.2)
Albumin: 4.1 g/dL (ref 3.8–4.9)
Alkaline Phosphatase: 87 IU/L (ref 44–121)
BUN/Creatinine Ratio: 11 (ref 9–20)
BUN: 11 mg/dL (ref 6–24)
Bilirubin Total: 0.6 mg/dL (ref 0.0–1.2)
CO2: 22 mmol/L (ref 20–29)
Calcium: 9.6 mg/dL (ref 8.7–10.2)
Chloride: 103 mmol/L (ref 96–106)
Creatinine, Ser: 0.96 mg/dL (ref 0.76–1.27)
Globulin, Total: 2.7 g/dL (ref 1.5–4.5)
Glucose: 125 mg/dL — ABNORMAL HIGH (ref 70–99)
Potassium: 4.4 mmol/L (ref 3.5–5.2)
Sodium: 140 mmol/L (ref 134–144)
Total Protein: 6.8 g/dL (ref 6.0–8.5)
eGFR: 93 mL/min/{1.73_m2} (ref >59)

## 2022-03-18 LAB — T4, FREE: Free T4: 1.91 ng/dL — ABNORMAL HIGH (ref 0.82–1.77)

## 2022-03-18 LAB — TSH: TSH: 0.355 u[IU]/mL — ABNORMAL LOW (ref 0.450–4.500)

## 2022-03-18 MED ORDER — SERTRALINE HCL 100 MG PO TABS: 200.0000 mg | ORAL_TABLET | Freq: Every day | ORAL | 3 refills | Status: DC

## 2022-03-19 ENCOUNTER — Other Ambulatory Visit (HOSPITAL_COMMUNITY): Payer: Self-pay

## 2022-03-19 DIAGNOSIS — R1310 Dysphagia, unspecified: Secondary | ICD-10-CM

## 2022-03-19 NOTE — Telephone Encounter (Signed)
Spoke with pt regarding FMLA paperwork, pt states that [employer] is telling him that they have not yet received paperwork. Confirmed fax # with pt, [phone]. Will route to primary nurse to re-fax paperwork and keep confirmation fax to show successfully received. Pt verbalizes understanding.

## 2022-03-19 NOTE — Telephone Encounter (Signed)
Spoke to patient FMLA paperwork refaxed to [facility] [employer] at fax # [phone].Advised I received a confirmation both faxes when through on 7/17 and today 7/19.

## 2022-03-19 NOTE — Telephone Encounter (Signed)
Patient is calling back because [employer] saying the didn't get it. Patient ask that it can be refax this last time as well a keep the confirm paper that says it was fax. Please advise

## 2022-03-20 ENCOUNTER — Telehealth: Payer: Self-pay | Admitting: Cardiology

## 2022-03-20 NOTE — Telephone Encounter (Signed)
Spoke to patient he stated [employer] still have not received FMLA paperwork.Advised I have received confirmation faxes went through.Patient will come by office to pick up copy of FMLA paperwork.Copies left at front desk.

## 2022-03-20 NOTE — Telephone Encounter (Signed)
Pt called asking for his social worker in the "device clinic". I am unsure who he is referring to and pt does not remember her name. Please advise.

## 2022-03-20 NOTE — Telephone Encounter (Addendum)
Patient called stating they told him they still having received his FMLA paperwork.  Patient would like to pick up the forms, please let patient know when forms are ready for pick up.

## 2022-03-21 ENCOUNTER — Telehealth: Payer: Self-pay | Admitting: Cardiology

## 2022-03-21 ENCOUNTER — Telehealth: Payer: Self-pay

## 2022-03-21 NOTE — Telephone Encounter (Signed)
Patient called stating the paperwork previously submitted had errors which is being corrected by his company and will be re-faxed to Dr. Martinique for approval.  Patient also stated he had an episode last night and wants to know if the recent reading should be submitted with the new application.

## 2022-03-21 NOTE — Telephone Encounter (Signed)
Spoke with patient informed him that Dr. Quentin Ore recommended patient go back to taking Amiodarone '200mg'$  BID and F/U with EP APP, patient voiced understanding of these instructions and appointment scheduled for 04/02/22 at 8:20AM with Oda Kilts

## 2022-03-21 NOTE — Telephone Encounter (Signed)
Patient called in stating that he has gotten shocked and would like to talk to a nurse

## 2022-03-21 NOTE — Telephone Encounter (Signed)
Returned the call to the patient. The patient stated that he needs a revision to the [facility] paperwork that was filled out. They will be faxing it back to the office.

## 2022-03-21 NOTE — Telephone Encounter (Signed)
Spoke with patient, attempted to have patient send a transmission, patient getting orange lights gave patient the number to [employer] to call and troubleshoot monitor.

## 2022-03-21 NOTE — Telephone Encounter (Signed)
Transmission received patient received shock 03/20/22 at 1842 ATP x 8 did not terminate rhythm. Patient voiced understanding of driving restrictions x 6 months, patient stated that he had not missed any medications, patient also noted that Dr. Quentin Ore decreased his Amiodarone at last OV, patient also requested that a social worker follow up with him.

## 2022-03-25 ENCOUNTER — Ambulatory Visit (HOSPITAL_COMMUNITY)
Admission: RE | Admit: 2022-03-25 | Discharge: 2022-03-25 | Disposition: A | Discharge status: Home / Self Care | Payer: BC Managed Care – PPO | Source: Ambulatory Visit | Attending: Gastroenterology | Admitting: Gastroenterology

## 2022-03-25 DIAGNOSIS — R1310 Dysphagia, unspecified: Secondary | ICD-10-CM | POA: Diagnosis not present

## 2022-03-25 NOTE — Telephone Encounter (Signed)
Patient is calling to see if the FMLA forms were received and fax back to his company.

## 2022-03-25 NOTE — Telephone Encounter (Signed)
I LMOVM for patient to call the device clinic.

## 2022-03-25 NOTE — Telephone Encounter (Signed)
Patient states his company has sent another FMLA to be filed out . The other form was incorrect . He states it was him not his son who needed it  is needed for the patient.   Informed patient RN does not see form in question. Attempt to give patient another fax number - patient decline states  the form will probably come tomorrow. RN informed patient  Will notify Dr Doug Sou nurse of conversation.

## 2022-03-25 NOTE — Progress Notes (Signed)
Modified Barium Swallow Progress Note  Patient Details  Name: Andrew Huerta MRN: [id number] Date of Birth: [birth year]  Today's Date: 03/25/2022  Modified Barium Swallow completed.  Full report located under Chart Review in the Imaging Section.  Brief recommendations include the following:  Clinical Impression  Pt's oropharyngeal swallow is WFL and there is no aspiration observed during this study despite challenging. He did have one of his coughing episdoes during the study while fluoroscopy was turned on, which included heaving/wretching in addition to coughing. This happened immediately after swallowing a sip of thin liquids, but there was no penetration or aspiration that occurred. No further coughing was elicited. Would continue with regular solids and thin liquids as tolerated. Would defer any additional w/u to MD, although did provide pt with handout regarding general esophageal precautions. Would consider additional GI and/or ENT work up if symptoms persist.   Swallow Evaluation Recommendations   Recommended Consults: Consider GI evaluation;Consider ENT evaluation   SLP Diet Recommendations: Regular solids;Thin liquid   Liquid Administration via: Cup;Straw   Medication Administration: Whole meds with liquid   Supervision: Patient able to self feed   Compensations: Slow rate;Small sips/bites;Follow solids with liquid   Postural Changes: Remain semi-upright after after feeds/meals (Comment);Seated upright at 90 degrees   Oral Care Recommendations: Oral care BID        Osie Bond., M.A. [place] Office [phone]  Secure chat preferred  03/25/2022,3:46 PM

## 2022-03-26 ENCOUNTER — Ambulatory Visit (HOSPITAL_COMMUNITY)
Admission: RE | Admit: 2022-03-26 | Discharge: 2022-03-26 | Disposition: A | Discharge status: Home / Self Care | Payer: BC Managed Care – PPO | Source: Ambulatory Visit | Attending: Internal Medicine | Admitting: Internal Medicine

## 2022-03-26 ENCOUNTER — Other Ambulatory Visit (HOSPITAL_COMMUNITY): Payer: BC Managed Care – PPO

## 2022-03-26 ENCOUNTER — Encounter (HOSPITAL_COMMUNITY): Payer: Self-pay | Admitting: Internal Medicine

## 2022-03-26 VITALS — BP 90/58 | HR 79 | Wt 151.2 lb

## 2022-03-26 DIAGNOSIS — Z8719 Personal history of other diseases of the digestive system: Secondary | ICD-10-CM | POA: Insufficient documentation

## 2022-03-26 DIAGNOSIS — E785 Hyperlipidemia, unspecified: Secondary | ICD-10-CM | POA: Insufficient documentation

## 2022-03-26 DIAGNOSIS — G4733 Obstructive sleep apnea (adult) (pediatric): Secondary | ICD-10-CM | POA: Diagnosis not present

## 2022-03-26 DIAGNOSIS — Z95810 Presence of automatic (implantable) cardiac defibrillator: Secondary | ICD-10-CM | POA: Insufficient documentation

## 2022-03-26 DIAGNOSIS — L905 Scar conditions and fibrosis of skin: Secondary | ICD-10-CM | POA: Insufficient documentation

## 2022-03-26 DIAGNOSIS — I25118 Atherosclerotic heart disease of native coronary artery with other forms of angina pectoris: Secondary | ICD-10-CM | POA: Diagnosis not present

## 2022-03-26 DIAGNOSIS — Z955 Presence of coronary angioplasty implant and graft: Secondary | ICD-10-CM | POA: Insufficient documentation

## 2022-03-26 DIAGNOSIS — I2510 Atherosclerotic heart disease of native coronary artery without angina pectoris: Secondary | ICD-10-CM | POA: Diagnosis not present

## 2022-03-26 DIAGNOSIS — R0602 Shortness of breath: Secondary | ICD-10-CM | POA: Insufficient documentation

## 2022-03-26 DIAGNOSIS — I110 Hypertensive heart disease with heart failure: Secondary | ICD-10-CM | POA: Diagnosis not present

## 2022-03-26 DIAGNOSIS — Z7984 Long term (current) use of oral hypoglycemic drugs: Secondary | ICD-10-CM | POA: Diagnosis not present

## 2022-03-26 DIAGNOSIS — I5022 Chronic systolic (congestive) heart failure: Secondary | ICD-10-CM | POA: Insufficient documentation

## 2022-03-26 DIAGNOSIS — I472 Ventricular tachycardia, unspecified: Secondary | ICD-10-CM | POA: Diagnosis not present

## 2022-03-26 DIAGNOSIS — Z7982 Long term (current) use of aspirin: Secondary | ICD-10-CM | POA: Diagnosis not present

## 2022-03-26 DIAGNOSIS — Z7901 Long term (current) use of anticoagulants: Secondary | ICD-10-CM | POA: Insufficient documentation

## 2022-03-26 DIAGNOSIS — Z7902 Long term (current) use of antithrombotics/antiplatelets: Secondary | ICD-10-CM | POA: Insufficient documentation

## 2022-03-26 DIAGNOSIS — Z79899 Other long term (current) drug therapy: Secondary | ICD-10-CM | POA: Insufficient documentation

## 2022-03-26 DIAGNOSIS — E118 Type 2 diabetes mellitus with unspecified complications: Secondary | ICD-10-CM | POA: Diagnosis not present

## 2022-03-26 DIAGNOSIS — E119 Type 2 diabetes mellitus without complications: Secondary | ICD-10-CM | POA: Insufficient documentation
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
## 2022-03-26 NOTE — Patient Instructions (Signed)
Medication Changes:  None, continue current medications  Lab Work:  none  Testing/Procedures:  none  Referrals:  You have been referred to Cardiac Rehab, they will call you to schedule  Special Instructions // Education:  Do the following things EVERYDAY: Weigh yourself in the morning before breakfast. Write it down and keep it in a log. Take your medicines as prescribed Eat low salt foods--Limit salt (sodium) to 2000 mg per day.  Stay as active as you can everyday Limit all fluids for the day to less than 2 liters   Follow-Up in: 3 months  At the [facility], you and your health needs are our priority. We have a designated team specialized in the treatment of Heart Failure. This Care Team includes your primary Heart Failure Specialized Cardiologist (physician), Advanced Practice Providers (APPs- Physician Assistants and Nurse Practitioners), and Pharmacist who all work together to provide you with the care you need, when you need it.   You may see any of the following providers on your designated Care Team at your next follow up:  Dr Glori Bickers Dr Haynes Kerns, NP Lyda Jester, [place] [facility] [place], [place] Audry Riles, PharmD   Please be sure to bring in all your medications bottles to every appointment.   Need to Contact us:  If you have any questions or concerns before your next appointment please send [place] a message through [place] or call our office at [phone].    TO LEAVE A MESSAGE FOR THE NURSE SELECT OPTION 2, PLEASE LEAVE A MESSAGE INCLUDING: YOUR NAME DATE OF BIRTH CALL BACK NUMBER REASON FOR CALL**this is important as we prioritize the call backs  YOU WILL RECEIVE A CALL BACK THE SAME DAY AS LONG AS YOU CALL BEFORE 4:00 PM

## 2022-03-27 ENCOUNTER — Other Ambulatory Visit: Payer: Self-pay | Admitting: Gastroenterology

## 2022-03-27 DIAGNOSIS — R197 Diarrhea, unspecified: Secondary | ICD-10-CM

## 2022-03-27 DIAGNOSIS — R110 Nausea: Secondary | ICD-10-CM

## 2022-03-27 DIAGNOSIS — R1084 Generalized abdominal pain: Secondary | ICD-10-CM

## 2022-03-28 ENCOUNTER — Ambulatory Visit
Admission: RE | Admit: 2022-03-28 | Discharge: 2022-03-28 | Disposition: A | Discharge status: Home / Self Care | Payer: BC Managed Care – PPO | Source: Ambulatory Visit | Attending: Gastroenterology | Admitting: Gastroenterology

## 2022-03-28 DIAGNOSIS — R1084 Generalized abdominal pain: Secondary | ICD-10-CM | POA: Diagnosis not present

## 2022-03-28 DIAGNOSIS — K8020 Calculus of gallbladder without cholecystitis without obstruction: Secondary | ICD-10-CM | POA: Diagnosis not present

## 2022-03-28 DIAGNOSIS — R110 Nausea: Secondary | ICD-10-CM

## 2022-03-28 DIAGNOSIS — R197 Diarrhea, unspecified: Secondary | ICD-10-CM
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
## 2022-03-28 NOTE — Telephone Encounter (Signed)
Spoke to patient we received a new FMLA form from [place].Advised to come to office on Mon 7/31.Advised form requires his signature before I fax can fax.

## 2022-03-28 NOTE — Telephone Encounter (Signed)
Pt is requesting call back in regards to FMLA update. He says he hasn't been paid in two months and would like to make sure everything is being fax and sent to where it needs to be sent to . Requesting call back.

## 2022-03-29 ENCOUNTER — Other Ambulatory Visit: Payer: Self-pay | Admitting: Family Medicine

## 2022-03-29 DIAGNOSIS — N521 Erectile dysfunction due to diseases classified elsewhere: Secondary | ICD-10-CM

## 2022-03-31 ENCOUNTER — Ambulatory Visit (INDEPENDENT_AMBULATORY_CARE_PROVIDER_SITE_OTHER): Payer: BC Managed Care – PPO | Admitting: Family Medicine

## 2022-03-31 ENCOUNTER — Encounter: Payer: Self-pay | Admitting: Family Medicine

## 2022-03-31 ENCOUNTER — Other Ambulatory Visit: Payer: Self-pay

## 2022-03-31 VITALS — BP 103/72 | HR 79 | Wt 151.6 lb

## 2022-03-31 DIAGNOSIS — N521 Erectile dysfunction due to diseases classified elsewhere: Secondary | ICD-10-CM | POA: Diagnosis not present

## 2022-03-31 DIAGNOSIS — E0590 Thyrotoxicosis, unspecified without thyrotoxic crisis or storm: Secondary | ICD-10-CM

## 2022-03-31 DIAGNOSIS — K219 Gastro-esophageal reflux disease without esophagitis: Secondary | ICD-10-CM

## 2022-03-31 DIAGNOSIS — E119 Type 2 diabetes mellitus without complications: Secondary | ICD-10-CM | POA: Diagnosis not present

## 2022-03-31 DIAGNOSIS — R7989 Other specified abnormal findings of blood chemistry: Secondary | ICD-10-CM

## 2022-03-31 DIAGNOSIS — K22719 Barrett's esophagus with dysplasia, unspecified: Secondary | ICD-10-CM

## 2022-03-31 LAB — POCT GLYCOSYLATED HEMOGLOBIN (HGB A1C): HbA1c, POC (controlled diabetic range): 8.9 % — AB (ref 0.0–7.0)

## 2022-03-31 MED ORDER — PANTOPRAZOLE SODIUM 40 MG PO TBEC: 40.0000 mg | DELAYED_RELEASE_TABLET | Freq: Every day | ORAL | 2 refills | Status: DC

## 2022-03-31 MED ORDER — SILDENAFIL CITRATE 50 MG PO TABS: ORAL_TABLET | ORAL | 3 refills | Status: DC

## 2022-03-31 MED ORDER — METFORMIN HCL ER 500 MG PO TB24: 500.0000 mg | ORAL_TABLET | ORAL | 3 refills | Status: DC

## 2022-03-31 NOTE — Assessment & Plan Note (Signed)
A1c uptrended to 8.9 today (Goal <7). Reports taking glipizide and ertugliflozin as prescribed. Pt was previously on metformin for a while, but stopped after developing GI side effects. However, pt also had gallstones during this time so he is unsure if GI effects were misattributed to metformin. Pt is willing to retrial metformin. - Start Metformin XR '500mg'$  every other day. Starting with lower dosing to decrease chance of side effects. - Cont glipizide and ertugliflozin - f/u 3 month for A1c  Foot exam completed today. Decreased sensation and pt reports pain in legs, likely partly neurogenic. He reports trialing gabapentin in past but stopped due to side effects.

## 2022-03-31 NOTE — Assessment & Plan Note (Signed)
>>  ASSESSMENT AND PLAN FOR OTHER GASTRITIS WITH BLEEDING WRITTEN ON 06/06/2024  2:44 PM BY ELICIA HAMLET, MD   >>ASSESSMENT AND PLAN FOR BARRETT'S ESOPHAGUS WRITTEN ON 03/31/2022  9:12 PM BY Elie Gragert, MD  Unable to perform EGD at this time due to cardiac condition making it unsafe to undergo anesthesia. Had an upper GI series 7/13 notable for mild reflux esophagitis, and was negative for mass or stricture. -For Barrett's w/o dysplasia, EGD is recommended every 3-26yrs. However, Upper GI is not the preferred screening test, so we can consider repeating at earlier intervals until he is cardiac stable for EGD. - Continue Protonix

## 2022-03-31 NOTE — Assessment & Plan Note (Signed)
>>  ASSESSMENT AND PLAN FOR BARRETT'S ESOPHAGUS WRITTEN ON 03/31/2022  9:12 PM BY Beldon Nowling, TWYLA, MD  Unable to perform EGD at this time due to cardiac condition making it unsafe to undergo anesthesia. Had an upper GI series 7/13 notable for mild reflux esophagitis, and was negative for mass or stricture. -For Barrett's w/o dysplasia, EGD is recommended every 3-30yrs. However, Upper GI is not the preferred screening test, so we can consider repeating at earlier intervals until he is cardiac stable for EGD. - Continue Protonix

## 2022-03-31 NOTE — Progress Notes (Signed)
    SUBJECTIVE:   CHIEF COMPLAINT / HPI: Diabetes and thyroid function f/u  AK is a 56yo M w/ PMHx of HFrEF (EF 40-45% 7/23), V Tach, T2DM, barrets esophagus that is h/f thyroid and diabetes f/u.   Thyroid Pt was recently incidentally found to be hyperthyroid at cardiologist. Denies temp changes, new diarrhea, or unintended weight changes.  Diabetes Has been taking glipizide and steglatro as prescribed, last dose today. Has been on metformin in the past, but stopped after he had diarrhea. However, this was also when he had gallstones that also caused GI upset. Pt was previously on metformin for a while without issue.   - Was unable to have EGD or colonoscopy since he was unable to undergo anesthesia due to heart. He completed a barium swallow study in place of EGD.  PERTINENT  PMH / PSH:   OBJECTIVE:   BP 103/72   Pulse 79   Wt 151 lb 9.6 oz (68.8 kg)   SpO2 98%   BMI 23.74 kg/m   Gen: Joking, pleasant man resting comfortably Neck: Supple, no thyromegaly or nodules. Pulm: CTAB. Normal WOB CV: RRR Abm: Normal BS. Nontender, nondistended, and soft. MSK: Crepitus on medial R knee with flexion and extension of R knee. No swelling or warmth.    Foot exam completed and documented in chart.  ASSESSMENT/PLAN:   Barrett's esophagus Unable to perform EGD at this time due to cardiac condition making it unsafe to undergo anesthesia. Had an upper GI series 7/13 notable for mild reflux esophagitis, and was negative for mass or stricture. -For Barrett's w/o dysplasia, EGD is recommended every 3-48yr. However, Upper GI is not the preferred screening test, so we can consider repeating at earlier intervals until he is cardiac stable for EGD. - Continue Protonix  Controlled type 2 diabetes mellitus without complication, without long-term current use of insulin (HCC) A1c uptrended to 8.9 today (Goal <7). Reports taking glipizide and ertugliflozin as prescribed. Pt was previously on metformin  for a while, but stopped after developing GI side effects. However, pt also had gallstones during this time so he is unsure if GI effects were misattributed to metformin. Pt is willing to retrial metformin. - Start Metformin XR '500mg'$  every other day. Starting with lower dosing to decrease chance of side effects. - Cont glipizide and ertugliflozin - f/u 3 month for A1c  Foot exam completed today. Decreased sensation and pt reports pain in legs, likely partly neurogenic. He reports trialing gabapentin in past but stopped due to side effects.   Low TSH level Pt found to be hyperthyroid 7/17; mildly low TSH and mildly high T4. Denies temp changes, diarrhea, unintended weight changes. Thyroid exam was unremarkable. This could be due to his amiodarone vs Graves. Will repeat labs in 4 wks. - Repeat TSH, free T3, free T4 in 4 wks     JArlyce Dice MD [facility]

## 2022-03-31 NOTE — Patient Instructions (Addendum)
Good to see you today - Thank you for coming in!  Things we discussed today:  1)Diabetes  Your A1c was elevated today to 8.9. Our goal is to keep it under 7.  Start taking Metformin every other day.   2) Thyroid Your thyroid labs showed that you were slightly hyperthyroid (too much thyroid hormone). Your levels are not at a dangerous level. We will recheck your labs in 4 weeks to see if they go back to normal.    Come back to see me in 3 months to follow-up on diabetes.

## 2022-03-31 NOTE — Assessment & Plan Note (Signed)
Unable to perform EGD at this time due to cardiac condition making it unsafe to undergo anesthesia. Had an upper GI series 7/13 notable for mild reflux esophagitis, and was negative for mass or stricture. -For Barrett's w/o dysplasia, EGD is recommended every 3-30yr. However, Upper GI is not the preferred screening test, so we can consider repeating at earlier intervals until he is cardiac stable for EGD. - Continue Protonix

## 2022-03-31 NOTE — Telephone Encounter (Signed)
Patient walked in office asking if we received new FMLA paperwork.Advised we did receive and Dr.Jordan unable to complete.Advised he will need to do CPX as scheduled.He will take form to [facility] to see if they can complete since he saw them recently.

## 2022-03-31 NOTE — Assessment & Plan Note (Signed)
Pt found to be hyperthyroid 7/17; mildly low TSH and mildly high T4. Denies temp changes, diarrhea, unintended weight changes. Thyroid exam was unremarkable. This could be due to his amiodarone vs Graves. Will repeat labs in 4 wks. - Repeat TSH, free T3, free T4 in 4 wks

## 2022-04-01 ENCOUNTER — Ambulatory Visit (HOSPITAL_COMMUNITY): Payer: BC Managed Care – PPO | Attending: Adult Health

## 2022-04-01 DIAGNOSIS — I5022 Chronic systolic (congestive) heart failure: Secondary | ICD-10-CM | POA: Insufficient documentation

## 2022-04-01 LAB — MICROALBUMIN / CREATININE URINE RATIO
Creatinine, Urine: 84.1 mg/dL
Microalb/Creat Ratio: 5 mg/g{creat} (ref 0–29)
Microalbumin, Urine: 4 ug/mL

## 2022-04-02 ENCOUNTER — Other Ambulatory Visit (HOSPITAL_COMMUNITY): Payer: Self-pay

## 2022-04-02 ENCOUNTER — Encounter: Payer: Self-pay | Admitting: Student

## 2022-04-02 ENCOUNTER — Telehealth (HOSPITAL_COMMUNITY): Payer: Self-pay | Admitting: Licensed Clinical Social Worker

## 2022-04-02 ENCOUNTER — Ambulatory Visit (INDEPENDENT_AMBULATORY_CARE_PROVIDER_SITE_OTHER): Payer: BC Managed Care – PPO | Admitting: Student

## 2022-04-02 VITALS — BP 108/70 | HR 73 | Ht 67.0 in | Wt 152.0 lb

## 2022-04-02 DIAGNOSIS — I5022 Chronic systolic (congestive) heart failure: Secondary | ICD-10-CM | POA: Diagnosis not present

## 2022-04-02 DIAGNOSIS — I472 Ventricular tachycardia, unspecified: Secondary | ICD-10-CM | POA: Diagnosis not present

## 2022-04-02 DIAGNOSIS — I25118 Atherosclerotic heart disease of native coronary artery with other forms of angina pectoris: Secondary | ICD-10-CM

## 2022-04-02 MED ORDER — CLOPIDOGREL BISULFATE 75 MG PO TABS
75.0000 mg | ORAL_TABLET | Freq: Every day | ORAL | 2 refills | Status: DC
  Filled 2022-04-02: qty 30, 30d supply, fill #0

## 2022-04-02 MED ORDER — ATORVASTATIN CALCIUM 80 MG PO TABS
80.0000 mg | ORAL_TABLET | Freq: Every day | ORAL | 2 refills | Status: DC
  Filled 2022-04-02: qty 30, 30d supply, fill #0

## 2022-04-02 MED ORDER — CARVEDILOL 3.125 MG PO TABS
3.1250 mg | ORAL_TABLET | Freq: Two times a day (BID) | ORAL | 2 refills | Status: DC
  Filled 2022-04-02: qty 60, 30d supply, fill #0

## 2022-04-02 MED ORDER — AMIODARONE HCL 200 MG PO TABS
200.0000 mg | ORAL_TABLET | Freq: Two times a day (BID) | ORAL | 2 refills | Status: DC
  Filled 2022-04-02: qty 60, 30d supply, fill #0

## 2022-04-02 NOTE — Patient Instructions (Signed)
Medication Instructions:  Your physician recommends that you continue on your current medications as directed. Please refer to the Current Medication list given to you today.  *If you need a refill on your cardiac medications before your next appointment, please call your pharmacy*   Lab Work: None If you have labs (blood work) drawn today and your tests are completely normal, you will receive your results only by: [place] (if you have MyChart) OR A paper copy in the mail If you have any lab test that is abnormal or we need to change your treatment, we will call you to review the results.   Follow-Up: At [facility], you and your health needs are our priority.  As part of our continuing mission to provide you with exceptional heart care, we have created designated Provider Care Teams.  These Care Teams include your primary Cardiologist (physician) and Advanced Practice Providers (APPs -  Physician Assistants and Nurse Practitioners) who all work together to provide you with the care you need, when you need it.   Your next appointment:   3 month(s)  The format for your next appointment:   In Person  Provider:   Lars Mage, MD{

## 2022-04-02 NOTE — Progress Notes (Signed)
Heart and Vascular Care Navigation  04/02/2022  Andrew Huerta 1966-04-24 [id number]  Reason for Referral: Pt called CSW requesting help figuring out next steps due to financial concerns    Engaged with patient by telephone for initial visit for Heart and Vascular Care Coordination.                                                                                                   Assessment:   CSW spoke with pt about current concerns.  States that he lives in his deceased mothers home with his two adult sons.  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  Pt states he is out of two medications that he can't afford- was able to confirm he can get through [facility] $4 list- pt to follow up with his pharmacy regarding this.  CSW offered ot have all heart failure meds sent to our pharmacy under HF fund to assist with cost of meds- pt agreeable- clinic staff to send in.  Is not on any medications that need PAP started at this time.  Pt reports he has been unable to pay for mortgage for his moms house so is being foreclosed on-  will be sold on Sept 8th he thinks.  He has no plan for where he will go when this happens.  Only source of income is his 86yo son who works at [employer]- he gets about $1,200/month.  Other son who lives with them is 48yo and applied for disability approx 3-5 months ago- pt unsure exactly.  Pt is interested in going back to work but is under the impression that Dr. Haroldine Laws wanted him to stay out of work- had CPX test yesterday so hopefully MD cna provide clarification for pt when he returns next week about what is safe to do and if he can return to work with accommodations.  Pt was sent list of housing by CSW coworker in April but pt hasn't looked into this as he doesn't know what his income will  be and what housing he can afford.  CSW encouraged pt to start looking for options given current household income as there is no guarantee of what income will be next month and needs to start process if he is going to avoid period of homelessness.  States he has no family left so feels as if he would have no where to go if he doesn't find housing between now and foreclosure.                                       HRT/VAS Care Coordination     Patients Home Cardiology Office Heart Failure Clinic  [facility]   Outpatient Care Team Social Worker   Social Worker Name: Raquel Sarna, Marlinda Mike [phone]   Living arrangements for the past  2 months Single Family Home   Lives with: Adult Children   Patient Current Librarian, academic   Patient Has Concern With Paying Medical Bills No   Does Patient Have Prescription Coverage? Yes   Patient Prescription Assistance [place] [employer] None   DME Agency NA   [facility] Agency NA   Current home services --  none       Social History:                                                                             SDOH Screenings   Alcohol Screen: Low Risk  (12/12/2021)   Alcohol Screen    Last Alcohol Screening Score (AUDIT): 0  Depression (PHQ2-9): Medium Risk (03/31/2022)   Depression (PHQ2-9)    PHQ-2 Score: 12  Financial Resource Strain: High Risk (04/02/2022)   Overall Financial Resource Strain (CARDIA)    Difficulty of Paying Living Expenses: Very hard  Food Insecurity: No Food Insecurity (12/12/2021)   Hunger Vital Sign    Worried About Running Out of Food in the Last Year: Never true    Ran Out of Food in the Last Year: Never true  Housing: Medium Risk (04/02/2022)   Housing    Last Housing Risk Score: 1  Physical Activity: Not on file  Social Connections: Not on file  Stress: Stress Concern Present (08/15/2021)   [place]     Feeling of Stress : Very much  Tobacco Use: Low Risk  (04/02/2022)   Patient History    Smoking Tobacco Use: Never    Smokeless Tobacco Use: Never    Passive Exposure: Not on file  Transportation Needs: No Transportation Needs (04/02/2022)   PRAPARE - Transportation    Lack of Transportation (Medical): No    Lack of Transportation (Non-Medical): No    SDOH Interventions: Financial Resources:  Financial Strain Interventions: Other (Comment) (HF fund) Discussed disability but does not appear as if pt meets criteria- wants to discuss with Dr. Haroldine Laws as he thought he was told he should apply for disability.  Food Insecurity:    Gets food stamps  Housing Insecurity:  Housing Interventions: Other (Comment)  Transportation:   Transportation Interventions: Intervention Not Indicated   Follow-up plan:    Pt to start looking into housing.  Pt to follow up with physician next week regarding ability to return to work.  Will continue to follow and assist as needed  Jorge Ny, [facility] Desk#: [phone] Cell#: [phone]

## 2022-04-03 ENCOUNTER — Telehealth: Payer: Self-pay

## 2022-04-03 ENCOUNTER — Telehealth (HOSPITAL_COMMUNITY): Payer: Self-pay | Admitting: Licensed Clinical Social Worker

## 2022-04-03 ENCOUNTER — Other Ambulatory Visit (HOSPITAL_COMMUNITY): Payer: Self-pay

## 2022-04-03 DIAGNOSIS — K219 Gastro-esophageal reflux disease without esophagitis: Secondary | ICD-10-CM

## 2022-04-03 MED ORDER — PANTOPRAZOLE SODIUM 40 MG PO TBEC
40.0000 mg | DELAYED_RELEASE_TABLET | Freq: Every day | ORAL | 2 refills | Status: DC
  Filled 2022-04-03: qty 60, 60d supply, fill #0

## 2022-04-03 NOTE — Telephone Encounter (Signed)
Patient calls nurse line reporting he lost his insurance and is reports trouble paying for Pantoprazole.   Patient reports the medication is no longer on their $3 dollar list and cash price is 20 dollars with good rx. This is unaffordable for him.   [facility] to [facility]. Medication is listed on their 5 dollar list.   Patient advised to call them beforehand before verifying price.   Patient appreciative.   I have [facility] prescription.

## 2022-04-03 NOTE — Telephone Encounter (Signed)
H&V Care Navigation CSW Progress Note  Clinical Social Worker informed pt arrived at clinic requesting help with gas expenses  Provided $50 from [employer] program to assist  SDOH Screenings   Alcohol Screen: Low Risk  (12/12/2021)   Alcohol Screen    Last Alcohol Screening Score (AUDIT): 0  Depression (PHQ2-9): Medium Risk (03/31/2022)   Depression (PHQ2-9)    PHQ-2 Score: 12  Financial Resource Strain: High Risk (04/02/2022)   Overall Financial Resource Strain (CARDIA)    Difficulty of Paying Living Expenses: Very hard  Food Insecurity: No Food Insecurity (12/12/2021)   Hunger Vital Sign    Worried About Running Out of Food in the Last Year: Never true    Ran Out of Food in the Last Year: Never true  Housing: Medium Risk (04/02/2022)   Housing    Last Housing Risk Score: 1  Physical Activity: Not on file  Social Connections: Not on file  Stress: Stress Concern Present (08/15/2021)   [place]    Feeling of Stress : Very much  Tobacco Use: Low Risk  (04/02/2022)   Patient History    Smoking Tobacco Use: Never    Smokeless Tobacco Use: Never    Passive Exposure: Not on file  Transportation Needs: No Transportation Needs (04/02/2022)   PRAPARE - Transportation    Lack of Transportation (Medical): No    Lack of Transportation (Non-Medical): No     Jorge Ny, LCSW Clinical Social Worker [facility] Desk#: [phone] Cell#: [phone]

## 2022-04-04 ENCOUNTER — Other Ambulatory Visit: Payer: Self-pay

## 2022-04-04 ENCOUNTER — Telehealth (HOSPITAL_COMMUNITY): Payer: Self-pay | Admitting: Licensed Clinical Social Worker

## 2022-04-04 DIAGNOSIS — G894 Chronic pain syndrome: Secondary | ICD-10-CM

## 2022-04-04 MED ORDER — HYDROCODONE-ACETAMINOPHEN 5-325 MG PO TABS: 1.0000 | ORAL_TABLET | Freq: Three times a day (TID) | ORAL | 0 refills | Status: DC | PRN

## 2022-04-04 NOTE — Telephone Encounter (Signed)
CAFA sent to pt to help with [facility] bills- encouraged pt to reach out with any questions or concerns.  Jorge Ny, LCSW Clinical Social Worker [facility] Desk#: [phone] Cell#: [phone]

## 2022-04-07 ENCOUNTER — Telehealth: Payer: Self-pay

## 2022-04-07 DIAGNOSIS — G894 Chronic pain syndrome: Secondary | ICD-10-CM

## 2022-04-07 MED ORDER — HYDROCODONE-ACETAMINOPHEN 5-325 MG PO TABS: 1.0000 | ORAL_TABLET | Freq: Three times a day (TID) | ORAL | 0 refills | Status: DC | PRN

## 2022-04-07 NOTE — Telephone Encounter (Signed)
Patient calls nurse line in regards to Hydrocodone.  Patient no longer has insurance and Suzie Portela is too expensive.   Patient reports [name] will be cheaper for him.   I have cancelled prescription at [facility]   Please resend to [facility].

## 2022-04-16 ENCOUNTER — Encounter (HOSPITAL_COMMUNITY): Payer: BC Managed Care – PPO

## 2022-04-17 NOTE — Progress Notes (Signed)
Remote pacemaker transmission.   

## 2022-04-18 ENCOUNTER — Encounter: Payer: Self-pay | Admitting: Family Medicine

## 2022-04-18 ENCOUNTER — Other Ambulatory Visit: Payer: Self-pay | Admitting: Internal Medicine

## 2022-04-18 ENCOUNTER — Ambulatory Visit (INDEPENDENT_AMBULATORY_CARE_PROVIDER_SITE_OTHER): Payer: BC Managed Care – PPO | Admitting: Family Medicine

## 2022-04-18 VITALS — BP 129/83 | HR 66 | Ht 67.0 in | Wt 151.4 lb

## 2022-04-18 DIAGNOSIS — T148XXA Other injury of unspecified body region, initial encounter: Secondary | ICD-10-CM | POA: Diagnosis not present

## 2022-04-18 DIAGNOSIS — E079 Disorder of thyroid, unspecified: Secondary | ICD-10-CM | POA: Diagnosis not present

## 2022-04-18 DIAGNOSIS — M549 Dorsalgia, unspecified: Secondary | ICD-10-CM

## 2022-04-18 DIAGNOSIS — R109 Unspecified abdominal pain: Secondary | ICD-10-CM

## 2022-04-18 LAB — POCT URINALYSIS DIP (MANUAL ENTRY)
Bilirubin, UA: NEGATIVE
Blood, UA: NEGATIVE
Glucose, UA: >=1000 mg/dL — AB
Ketones, POC UA: NEGATIVE mg/dL
Leukocytes, UA: NEGATIVE
Nitrite, UA: NEGATIVE
Protein Ur, POC: NEGATIVE mg/dL
Spec Grav, UA: 1.015 (ref 1.010–1.025)
Urobilinogen, UA: 0.2 E.U./dL
pH, UA: 7 (ref 5.0–8.0)

## 2022-04-18 LAB — POCT UA - MICROSCOPIC ONLY: WBC, Ur, HPF, POC: NONE SEEN (ref 0–5)

## 2022-04-18 NOTE — Assessment & Plan Note (Addendum)
Differentials include kidney stone vs kidney infection. However, pain improved and urinalysis is negative for blood, nitrite or leukocytes. +Glucosuria in a patient with DM. No bruising seen on exam today. Blood work (CBC, Cmet) planned, unfortunately, the lab does not have a phlebotomist today. He has surgical appointment next week and will plan on obtaining blood work then. F/U soon if symptoms reoccurs. He agreed with the plan.  I called and discussed his urine test results with him.

## 2022-04-18 NOTE — Patient Instructions (Addendum)
Flank Pain, Adult ?Flank pain is pain in your side. The flank is the area on your side between your upper belly (abdomen) and your spine. The pain may occur over a short time (acute), or it may be long-term or come back often (chronic). It may be mild or very bad. Pain in this area can be caused by many different things. ?Follow these instructions at home: ? ?Drink enough fluid to keep your pee (urine) pale yellow. ?Rest as told by your doctor. ?Take over-the-counter and prescription medicines only as told by your doctor. ?Keep a journal to keep track of: ?What has caused your flank pain. ?What has made your flank pain feel better. ?Keep all follow-up visits. ?Contact a doctor if: ?Medicine does not help your pain. ?You have new symptoms. ?Your pain gets worse. ?Your symptoms last longer than 2-3 days. ?You have trouble peeing. ?You are peeing more often than normal. ?Get help right away if: ?You have trouble breathing. ?You are short of breath. ?Your belly hurts, or it is swollen or red. ?You feel like you may vomit (nauseous). ?You vomit. ?You feel faint, or you faint. ?You have blood in your pee. ?You have flank pain and a fever. ?These symptoms may be an emergency. Get help right away. Call your local emergency services (911 in the [place] ?Do not wait to see if the symptoms will go away. ?Do not drive yourself to the hospital. ?Summary ?Flank pain is pain in your side. The flank is the area of your side between your upper belly (abdomen) and your spine. ?Flank pain may occur over a short time (acute), or it may be long-term or come back often (chronic). It may be mild or very bad. ?Pain in this area can be caused by many different things. ?Contact your doctor if your symptoms get worse or last longer than 2-3 days. ?This information is not intended to replace advice given to you by your health care provider. Make sure you discuss any questions you have with your health care provider. ?Document Revised:  10/29/2020 Document Reviewed: 10/29/2020 ?Elsevier Patient Education ? 2023 Elsevier Inc. ? ?

## 2022-04-18 NOTE — Progress Notes (Signed)
    SUBJECTIVE:   CHIEF COMPLAINT / HPI:   Back Pain This is a new (Left flank pain and bruising x 1 week) problem. The problem has been gradually improving (Bruising has resolved. He showed a picture on his phone) since onset. Pain location: Left flank. The pain is at a severity of 3/10 (Pain was about 7/10 in severity about 1 week ago). Exacerbated by: hurst lying down on that spot. Pertinent negatives include no abdominal pain, bladder incontinence, dysuria, fever or numbness. (He felt chilly, but did not think he had a fever.) Risk factors: No trauma or injury. Treatments tried: Uses Vicodin as needed for pain at baseline. The treatment provided mild relief.   Abnormal TFT: Here for f/u. No concerns. Was seen recently by PCP who recommended repeat test in 4 weeks.  PERTINENT  PMH / PSH: PMHx reviewed  OBJECTIVE:   BP 129/83   Pulse 66   Ht '5\' 7"'$  (1.702 m)   Wt 151 lb 6 oz (68.7 kg)   SpO2 98%   BMI 23.71 kg/m   Physical Exam Vitals and nursing note reviewed.  Cardiovascular:     Rate and Rhythm: Normal rate and regular rhythm.     Pulses: Normal pulses.     Heart sounds: Normal heart sounds. No murmur heard. Pulmonary:     Effort: Pulmonary effort is normal. No respiratory distress.     Breath sounds: Normal breath sounds.  Abdominal:     General: Abdomen is flat. Bowel sounds are normal. There is no distension.     Palpations: There is no mass.     Tenderness: There is no abdominal tenderness. There is no right CVA tenderness or left CVA tenderness.     Comments: No bruising of his flank skin B/L      ASSESSMENT/PLAN:   Flank pain Differentials include kidney stone vs kidney infection. However, pain improved and urinalysis is negative for blood, nitrite or leukocytes. +Glucosuria in a patient with DM. No bruising seen on exam today. Blood work (CBC, Cmet) planned, unfortunately, the lab does not have a phlebotomist today. He has surgical appointment next week and  will plan on obtaining blood work then. F/U soon if symptoms reoccurs. He agreed with the plan.  I called and discussed his urine test results with him.   Thyroid dysfunction: I review lab and notes from his recent PCP visit. Return in 1-2 weeks for lab test as we are unable to obtain labs today. He agreed with the plan.   Andrena Mews, MD [place]

## 2022-05-01 ENCOUNTER — Encounter (HOSPITAL_COMMUNITY): Payer: Self-pay | Admitting: *Deleted

## 2022-05-01 NOTE — Progress Notes (Signed)
Disability forms completed with RTW date of 05/12/22 completed, signed by Dr Haroldine Laws, and faxed into [employer] at [phone], pt aware

## 2022-05-01 NOTE — Addendum Note (Signed)
Encounter addended by: Jolaine Artist, MD on: 05/01/2022 4:01 PM  Actions taken: Clinical Note Signed

## 2022-05-08 ENCOUNTER — Other Ambulatory Visit: Payer: Self-pay

## 2022-05-08 ENCOUNTER — Ambulatory Visit: Payer: BC Managed Care – PPO | Admitting: Family Medicine

## 2022-05-08 DIAGNOSIS — G894 Chronic pain syndrome: Secondary | ICD-10-CM

## 2022-05-09 ENCOUNTER — Telehealth: Payer: Self-pay

## 2022-05-09 MED ORDER — HYDROCODONE-ACETAMINOPHEN 5-325 MG PO TABS: 1.0000 | ORAL_TABLET | Freq: Three times a day (TID) | ORAL | 0 refills | Status: DC | PRN

## 2022-05-09 NOTE — Telephone Encounter (Signed)
[employer] benefit form completed and faxed to [phone].

## 2022-05-14 ENCOUNTER — Telehealth: Payer: Self-pay

## 2022-05-14 DIAGNOSIS — Z4502 Encounter for adjustment and management of automatic implantable cardiac defibrillator: Secondary | ICD-10-CM

## 2022-05-14 NOTE — Telephone Encounter (Signed)
[employer] Alert~  Lat[employer] alert received for ICD therapies. Episode occurred on 05/11/22 @ 1724 with rates 206-212bpm, ATP x 1 terminated to Vs. Normal device function. On Amiodarone, Coreg. Routing for further review per protocol.   Patient called, reports asymptomatic and reports he wasn't doing anything that could have been associated with event. Denies fluid retention symptoms.   When reviewing medications, patient reports possibly not taking his coreg twice a day, only taking it once a day. Advised patient to take twice a day, patient voiced understanding. Patient was compliant with all other medications on file.   Discussed [place] DMV driving restrictions x6 months and shock plan with verbal understanding. Advised if any changes are made we will f/u. Patient voiced understanding and agreeable to plan.

## 2022-05-15 NOTE — Addendum Note (Signed)
Addended by: Roma Kayser T on: 05/15/2022 03:37 PM   Modules accepted: Orders

## 2022-05-15 NOTE — Addendum Note (Signed)
Addended by: Roma Kayser T on: 05/15/2022 03:55 PM   Modules accepted: Orders

## 2022-05-16 ENCOUNTER — Other Ambulatory Visit: Payer: BC Managed Care – PPO

## 2022-05-16 NOTE — Progress Notes (Unsigned)
Cardiology Office Note Date:  05/16/2022  Patient ID:  Andrew Huerta, Andrew Huerta 1965/09/25, MRN [id number] PCP:  [name], MD  Cardiologist:  Dr. Martinique Electrophysiologist: Dr. Quentin Ore  ***refresh   Chief Complaint: *** ATP therapies  History of Present Illness: Andrew Huerta is a 56 y.o. male with history of hx of CAD (PCI to LAD/LCx 2015, PCI to RCA 2020, PCI to LAD 12/26/21), DM,  Barrett's esophagus,  adrenal tumor s/p adrenal gland resection at [facility], HTN, HLD, primary hyperaldosteronism s/p adrenalectomy, and OSA, VT  He last saw Dr. Quentin Ore July 2023, he was doing well, no VT.  Amio dose was reduced and planned for EP APP visit in 4 mo.  He saw Dr. Haroldine Laws July 2023, significant personal stressors, cares for his 2 adult sons (both with disabilities), home from work after his last hospitalization. Doing OK otherwise, last echo with some improvement in his EF.  No changes made, planned for CPX testing.  He saw [name] 04/02/22, c/w personal struggles, particularly with FMLA vs disability.  Atypical CPs. Prelim CPX with mod HF limitations. Mild TSH abnormalities following with PMD, maintained on amio '200mg'$  BID  Device clinic alert for VT/ATP therapy on 05/11/22, pt was asymptomatic, in review of med, only taking coreg daily and advised to take BID. No driving discussed.  *** labs, lytes *** why under dosing coreg? *** no driving *** amio labs *** volume *** angina?    Device information BSci single chamber ICD implanted 12/13/2021 Secondary prevention + appropriate therapies June 2023, VT Appropriate ATP 05/2022 (in environment of *** unintentional coreg non-compliance)  AAD Hx Amiodarone started April 2023  Past Medical History:  Diagnosis Date   Adrenal tumor    a. s/p adrenal gland resection at [facility]. left side removal per patient   Anxiety    Barrett's esophagus    EGD - 11/27/09 - short segment of Barrett's   CAD (coronary artery disease)    a. 04/27/14 [place] s/p  DES to mLAD and DES to mLCx   Chronic back pain    upper and lower per patient   Chronic chest pain    Degenerative joint disease    Bilateral knees. Significant knee pain since playing football in high school. also in back per patient   Depression    Diabetes mellitus type 2, controlled ([place]) 08/29/2015   Diagnosed by A1c 7.6% during 08/26/15 hospital admission for chest pain   Gastroesophageal reflux disease with hiatal hernia    GERD (gastroesophageal reflux disease)    Hiatal hernia    EGD - 11/27/2009   Hyperlipidemia    Hypertension    Low back pain    Primary hyperaldosteronism ([place]) 01/22/2012   S/P adrenalectomy      PTSD (post-traumatic stress disorder)    Seizures ([place])    Sleep apnea    Stone, kidney    Syncope 04/10/2019    Past Surgical History:  Procedure Laterality Date   ADRENALECTOMY  10/2013   CARDIAC CATHETERIZATION  05/01/2014   Patent stents, other disease unchanged   CARDIAC CATHETERIZATION  04/27/2014   Procedure: CORONARY STENT INTERVENTION;  Surgeon: Peter M Martinique, MD;  Location: [facility] CATH LAB;  Service: Cardiovascular;;  DES mid Cx  DES mid LAD   CARDIAC CATHETERIZATION N/A 03/22/2015   Procedure: Left Heart Cath and Coronary Angiography;  Surgeon: Sherren Mocha, MD;  Location: [place] CV LAB;  Service: Cardiovascular;  Laterality: N/A;   CORONARY ANGIOPLASTY WITH STENT PLACEMENT  04/27/2014   3.0 x 16 mm Promus DES to the mid LAD and 3.5 x 28 mm Promus to the mid LCx, otherwise 20-30 percent lesions, EF 55%   CORONARY STENT INTERVENTION N/A 01/04/2019   Procedure: CORONARY STENT INTERVENTION;  Surgeon: Troy Sine, MD;  Location: [place] CV LAB;  Service: Cardiovascular;  Laterality: N/A;   CORONARY STENT INTERVENTION N/A 12/26/2021   Procedure: CORONARY STENT INTERVENTION;  Surgeon: Martinique, Peter M, MD;  Location: [place] CV LAB;  Service: Cardiovascular;  Laterality: N/A;   ICD IMPLANT N/A 12/13/2021   Procedure: ICD IMPLANT;  Surgeon:  [name], MD;  Location: [place] CV LAB;  Service: Cardiovascular;  Laterality: N/A;   KIDNEY STONE SURGERY  X 1   KNEE ARTHROPLASTY Right 1984   KNEE ARTHROSCOPY Right X 6   LEFT HEART CATH AND CORONARY ANGIOGRAPHY N/A 01/04/2019   Procedure: LEFT HEART CATH AND CORONARY ANGIOGRAPHY;  Surgeon: Troy Sine, MD;  Location: [place] CV LAB;  Service: Cardiovascular;  Laterality: N/A;   LEFT HEART CATHETERIZATION WITH CORONARY ANGIOGRAM N/A 04/27/2014   Procedure: LEFT HEART CATHETERIZATION WITH CORONARY ANGIOGRAM;  Surgeon: Peter M Martinique, MD;  Location: [facility] CATH LAB;  Service: Cardiovascular;  Laterality: N/A;   LEFT HEART CATHETERIZATION WITH CORONARY ANGIOGRAM N/A 05/01/2014   Procedure: LEFT HEART CATHETERIZATION WITH CORONARY ANGIOGRAM;  Surgeon: Burnell Blanks, MD;  Location: [facility] CATH LAB;  Service: Cardiovascular;  Laterality: N/A;   LITHOTRIPSY  X 2   RIGHT/LEFT HEART CATH AND CORONARY ANGIOGRAPHY N/A 12/12/2021   Procedure: RIGHT/LEFT HEART CATH AND CORONARY ANGIOGRAPHY;  Surgeon: Jolaine Artist, MD;  Location: [place] CV LAB;  Service: Cardiovascular;  Laterality: N/A;    Current Outpatient Medications  Medication Sig Dispense Refill   amiodarone (PACERONE) 200 MG tablet Take 1 tablet (200 mg total) by mouth 2 (two) times daily. 60 tablet 2   aspirin EC 81 MG tablet Take 81 mg by mouth daily.     atorvastatin (LIPITOR) 80 MG tablet Take 1 tablet (80 mg total) by mouth daily. 30 tablet 2   carvedilol (COREG) 3.125 MG tablet Take 1 tablet (3.125 mg total) by mouth 2 (two) times daily with a meal. 60 tablet 2   clopidogrel (PLAVIX) 75 MG tablet Take 1 tablet (75 mg total) by mouth daily. 30 tablet 2   ertugliflozin L-PyroglutamicAc (STEGLATRO) 5 MG TABS tablet Take 1 tablet (5 mg total) by mouth daily before breakfast. 30 tablet 6   glipiZIDE (GLUCOTROL) 5 MG tablet Take 1 tablet (5 mg total) by mouth daily. 90 tablet 3   HYDROcodone-acetaminophen  (NORCO/VICODIN) 5-325 MG tablet Take 1-2 tablets by mouth every 8 (eight) hours as needed for moderate pain. 80 tablet 0   metFORMIN (GLUCOPHAGE-XR) 500 MG 24 hr tablet Take 1 tablet (500 mg total) by mouth every other day. 180 tablet 3   nitroGLYCERIN (NITROSTAT) 0.4 MG SL tablet Place 1 tablet (0.4 mg total) under the tongue every 5 (five) minutes as needed. (Patient not taking: Reported on 04/18/2022) 25 tablet 2   pantoprazole (PROTONIX) 40 MG tablet Take 1 tablet (40 mg total) by mouth daily. 60 tablet 2   sertraline (ZOLOFT) 100 MG tablet Take 2 tablets (200 mg total) by mouth daily. 30 tablet 3   sildenafil (VIAGRA) 50 MG tablet TAKE 1 TABLET BY MOUTH DAILY AS NEEDED FOR FOR ERECTILE DYSFUNCTION (Patient not taking: Reported on 04/18/2022) 20 tablet 3   sildenafil (VIAGRA) 50 MG tablet  TAKE ONE TABLET BY MOUTH DAILY AS NEEDED FOR ERECTILE DYSFUNCTION (Patient not taking: Reported on 04/18/2022) 20 tablet 3   No current facility-administered medications for this visit.    Allergies:   Ozempic (0.25 or 0.5 mg-dose) [semaglutide(0.25 or 0.'5mg'$ -dos)], Cortisone acetate, Darvocet [propoxyphene n-acetaminophen], Nsaids, Spironolactone, Xanax [alprazolam], and Tape   Social History:  The patient  reports that he has never smoked. He has never used smokeless tobacco. He reports current alcohol use. He reports current drug use. Drug: Marijuana.   Family History:  The patient's family history includes Cancer in his father; Diabetes in his father and paternal grandmother; Heart attack in his paternal grandfather.  ROS:  Please see the history of present illness.    All other systems are reviewed and otherwise negative.   PHYSICAL EXAM:  VS:  There were no vitals taken for this visit. BMI: There is no height or weight on file to calculate BMI. Well nourished, well developed, in no acute distress HEENT: normocephalic, atraumatic Neck: no JVD, carotid bruits or masses Cardiac:  *** RRR; no significant  murmurs, no rubs, or gallops Lungs:  *** CTA b/l, no wheezing, rhonchi or rales Abd: soft, nontender MS: no deformity or *** atrophy Ext: *** no edema Skin: warm and dry, no rash Neuro:  No gross deficits appreciated Psych: euthymic mood, full affect  *** ICD site is stable, no tethering or discomfort   EKG:  Done today and reviewed by myself shows  ***  Device interrogation done today and reviewed by myself:  ***   04/01/2022: CPX Conclusion: Exercise testing with gas exchange demonstrates mild functional impairment when compared to matched sedentary norms. VE/VCO2 slope suggests primarily a more moderate HF limitation.  Test, report and preliminary impression by:  Kathy Breach, MS, ACSM-RCEP  04/01/2022 12:26 PM   Attending: Peak VO2 is only mildly reduced but VE/VCO2 is markedly elevated and suggests a more moderate HF limitation. At peak exercise, patient also reached his ventilatory limits. Glori Bickers, MD  10:20 PM    03/03/2022: TTE 1. Left ventricular ejection fraction, by estimation, is 40 to 45%. The  left ventricle has mildly decreased function. The left ventricle  demonstrates regional wall motion abnormalities (see scoring  diagram/findings for description). Left ventricular  diastolic parameters are indeterminate.   2. Right ventricular systolic function is normal. The right ventricular  size is normal. Tricuspid regurgitation signal is inadequate for assessing  PA pressure.   3. Right atrial size was mildly dilated.   4. The mitral valve is normal in structure. No evidence of mitral valve  regurgitation. No evidence of mitral stenosis.   5. The aortic valve is tricuspid. Aortic valve regurgitation is not  visualized. Aortic valve sclerosis is present, with no evidence of aortic  valve stenosis.   6. The inferior vena cava is normal in size with greater than 50%  respiratory variability, suggesting right atrial pressure of 3 mmHg.    12/26/2021:  LHC/PCI   Ost LAD to Prox LAD lesion is 90% stenosed.   A drug-eluting stent was successfully placed using a STENT ONYX FRONTIER 3.0X30.   Post intervention, there is a 0% residual stenosis.   Successful PCI of the proximal to mid LAD with OCT guidance, orbital atherectomy and DES x 1 overlapping with stent in the mid LAD.   12/12/21:R/LHC Mid LAD lesion is 30% stenosed.   Mid RCA to Dist RCA lesion is 100% stenosed.   Mid RCA lesion is 99% stenosed.  Prox RCA lesion is 40% stenosed.   Ost LAD to Prox LAD lesion is 90% stenosed.   Non-stenotic Prox LAD lesion was previously treated.   Non-stenotic Prox LAD to Mid LAD lesion was previously treated.   Non-stenotic Ost Cx to Prox Cx lesion was previously treated.   The left ventricular ejection fraction is 25-35% by visual estimate.   Findings:   Ao = 110/68 (86) LV = 115/16 RA = 10  RV = 30/13 PA = 28/15 (21) PCW = 14 Fick cardiac output/index = 5.2/2.9 PVR = 1.3 WU FA sat = 97% PA sat = 69%, 70% PAPi = 1.3   Assessment: 1. 3v CAD with 90% lesion in proximal LAD prior to previous stent otherwise stable CAD with patency of previous LAD & LCx stents 2. iCM EF 30-35% 3. Relatively well-compensated hemodynamics with evidence of RV dysfunction   Plan/Discussion:    Suspect VT is scar-mediated and will need ICD. However, he also has significant progression of CAD in proximal LAD and will need PCI/atherectomy of this area. We will coordinate timing of ICD and PCI procedures to minimize risk.   12/11/21: TTE IMPRESSIONS   1. Left ventricular ejection fraction, by estimation, is 20 to 25%. The  left ventricle has severely decreased function. The left ventricle has no  regional wall motion abnormalities. Left ventricular diastolic parameters  are indeterminate. There is  akinesis of the left ventricular, basal-mid inferoseptal wall and  anteroseptal wall. There is akinesis of the left ventricular, apical  septal wall. There is  akinesis of the left ventricular, mid-apical  inferior wall and anterior wall. There is  hypokinesis of the left ventricular, entire lateral wall and inferolateral  wall.   2. Right ventricular systolic function is mildly reduced. The right  ventricular size is mildly enlarged. The estimated right ventricular  systolic pressure is 51.0 mmHg.   3. Right atrial size was moderately dilated.   4. The mitral valve is normal in structure. Trivial mitral valve  regurgitation. No evidence of mitral stenosis.   5. The aortic valve is normal in structure. Aortic valve regurgitation is  not visualized. Aortic valve sclerosis is present, with no evidence of  aortic valve stenosis.   6. The inferior vena cava is dilated in size with <50% respiratory  variability, suggesting right atrial pressure of 15 mmHg.      Cath 01/04/2019 Mid RCA lesion is 99% stenosed. Mid RCA to Dist RCA lesion is 100% stenosed. Ost LAD to Prox LAD lesion is 40% stenosed. Previously placed Prox LAD stent (unknown type) is widely patent. Prox LAD to Mid LAD lesion is 80% stenosed. A stent was successfully placed. Post intervention, there is a 0% residual stenosis. Post intervention, there is a 0% residual stenosis. Mid LAD lesion is 30% stenosed. Previously placed Ost Cx to Prox Cx stent (unknown type) is widely patent.   Multivessel CAD with 40% smooth ostial proximal stenosis of the LAD with a septal perforating artery that arises from the ostium and has a 90% stenosis (not able to be depicted in the diagram), widely patent stent proximally followed by new 80% stenosis the on the stented segment and proximal to the mid diagonal vessels.  There is 30% LAD stenosis beyond the mid diagonal vessels; patent moderate-sized ramus intermediate vessel; patent proximal left circumflex stent; and diffuse 99% to 100% occlusion of the mid distal RCA with evidence for moderate left to right collateralization from the LAD, septal perforating  arteries, and LCX.  LVEDP 17 mmHg.   Successful PCI to the 80% LAD stenosis with ultimate insertion of a 2.5 x 18 mm Resolute Onyx DES stent postdilated to 2.6 mm with the 80% stenosis being reduced to 0%.   RECOMMENDATION: DAPT for minimum of 1 year and possibly long-term due to the patient's significant CAD history.  Aggressive lipid-lowering therapy with target LDL less than 70.  Medical therapy for RCA occlusion with collateralization.   Echo 03/01/20: IMPRESSIONS   1. Left ventricular ejection fraction, by estimation, is 40 to 45%. The  left ventricle has mildly decreased function. The left ventricle  demonstrates regional wall motion abnormalities (see scoring  diagram/findings for description). The left ventricular   internal cavity size was mildly dilated. Left ventricular diastolic  parameters are consistent with Grade I diastolic dysfunction (impaired  relaxation). There is severe hypokinesis of the left ventricular,  basal-mid inferoseptal wall and inferior wall.   2. Right ventricular systolic function is normal. The right ventricular  size is normal.   3. The mitral valve is normal in structure. Trivial mitral valve  regurgitation.   4. The aortic valve is normal in structure. Aortic valve regurgitation is  not visualized.   5. The inferior vena cava is normal in size with greater than 50%  respiratory variability, suggesting right atrial pressure of 3 mmHg.   Comparison(s): Prior images reviewed side by side. The left ventricular  function has improved.   Event monitor 04/09/20: Study Highlights   Normal sinus rhythm Very rare PACs Occasional PVCs. burden 4%    Recent Labs: 01/28/2022: BNP 40.2; Hemoglobin 14.7; Platelets 196 02/13/2022: Magnesium 2.4 03/17/2022: ALT 18; BUN 11; Creatinine, Ser 0.96; Potassium 4.4; Sodium 140; TSH 0.355  12/12/2021: Cholesterol 197; HDL 48; LDL Cholesterol 130; Total CHOL/HDL Ratio 4.1; Triglycerides 95; VLDL 19   CrCl cannot be  calculated (Patient's most recent lab result is older than the maximum 21 days allowed.).   Wt Readings from Last 3 Encounters:  04/18/22 151 lb 6 oz (68.7 kg)  04/02/22 152 lb (68.9 kg)  03/31/22 151 lb 9.6 oz (68.8 kg)     Other studies reviewed: Additional studies/records reviewed today include: summarized above  ASSESSMENT AND PLAN:  VT Amiodarone ***  ICD ***  CAD *** Dr. Martinique  ICM Chronic CHF *** Dr. Haroldine Laws  Disposition: F/u with ***  Current medicines are reviewed at length with the patient today.  The patient did not have any concerns regarding medicines.  Venetia Night, PA-C 05/16/2022 4:23 PM     [address] [place] [place] [address] [phone] (office)  [phone] (fax)

## 2022-05-19 ENCOUNTER — Ambulatory Visit: Payer: BC Managed Care – PPO | Attending: Cardiology | Admitting: Physician Assistant

## 2022-05-19 ENCOUNTER — Encounter: Payer: Self-pay | Admitting: Physician Assistant

## 2022-05-19 VITALS — BP 118/80 | HR 66 | Ht 67.0 in | Wt 152.6 lb

## 2022-05-19 DIAGNOSIS — I472 Ventricular tachycardia, unspecified: Secondary | ICD-10-CM

## 2022-05-19 DIAGNOSIS — I2510 Atherosclerotic heart disease of native coronary artery without angina pectoris: Secondary | ICD-10-CM

## 2022-05-19 DIAGNOSIS — I255 Ischemic cardiomyopathy: Secondary | ICD-10-CM | POA: Diagnosis not present

## 2022-05-19 DIAGNOSIS — Z95810 Presence of automatic (implantable) cardiac defibrillator: Secondary | ICD-10-CM

## 2022-05-19 DIAGNOSIS — Z79899 Other long term (current) drug therapy: Secondary | ICD-10-CM

## 2022-05-19 LAB — CUP PACEART INCLINIC DEVICE CHECK
Date Time Interrogation Session: 20230918093406
HighPow Impedance: 66 Ohm
HighPow Impedance: 77 Ohm
Implantable Lead Implant Date: 20230414
Implantable Lead Location: 753860
Implantable Lead Model: 672
Implantable Lead Serial Number: 182871
Implantable Pulse Generator Implant Date: 20230414
Lead Channel Impedance Value: 841 Ohm
Lead Channel Pacing Threshold Amplitude: 0.7 V
Lead Channel Pacing Threshold Pulse Width: 0.4 ms
Lead Channel Sensing Intrinsic Amplitude: 20.6 mV
Lead Channel Setting Pacing Amplitude: 2.5 V
Lead Channel Setting Pacing Pulse Width: 0.4 ms
Lead Channel Setting Sensing Sensitivity: 0.5 mV
Pulse Gen Serial Number: 310848

## 2022-05-19 LAB — BASIC METABOLIC PANEL
BUN/Creatinine Ratio: 15 (ref 9–20)
BUN: 15 mg/dL (ref 6–24)
CO2: 27 mmol/L (ref 20–29)
Calcium: 8.8 mg/dL (ref 8.7–10.2)
Chloride: 99 mmol/L (ref 96–106)
Creatinine, Ser: 0.98 mg/dL (ref 0.76–1.27)
Glucose: 289 mg/dL — ABNORMAL HIGH (ref 70–99)
Potassium: 4.2 mmol/L (ref 3.5–5.2)
Sodium: 139 mmol/L (ref 134–144)
eGFR: 91 mL/min/{1.73_m2} (ref >59)

## 2022-05-19 LAB — TSH: TSH: 0.41 u[IU]/mL — ABNORMAL LOW (ref 0.450–4.500)

## 2022-05-19 LAB — MAGNESIUM: Magnesium: 2.2 mg/dL (ref 1.6–2.3)

## 2022-05-19 NOTE — Patient Instructions (Signed)
Medication Instructions:   Your physician recommends that you continue on your current medications as directed. Please refer to the Current Medication list given to you today.  *If you need a refill on your cardiac medications before your next appointment, please call your pharmacy*    Lab Work: BMET MAG AND TSH TODAY   If you have labs (blood work) drawn today and your tests are completely normal, you will receive your results only by: [place] (if you have MyChart) OR A paper copy in the mail If you have any lab test that is abnormal or we need to change your treatment, we will call you to review the results.   Testing/Procedures: NONE ORDERED  TODAY    Follow-Up: At [facility], you and your health needs are our priority.  As part of our continuing mission to provide you with exceptional heart care, we have created designated Provider Care Teams.  These Care Teams include your primary Cardiologist (physician) and Advanced Practice Providers (APPs -  Physician Assistants and Nurse Practitioners) who all work together to provide you with the care you need, when you need it.  We recommend signing up for the patient portal called "MyChart".  Sign up information is provided on this After Visit Summary.  MyChart is used to connect with patients for Virtual Visits (Telemedicine).  Patients are able to view lab/test results, encounter notes, upcoming appointments, etc.  Non-urgent messages can be sent to your provider as well.   To learn more about what you can do with MyChart, go to [email or web address].    Your next appointment:   3 month(s)  The format for your next appointment:   In Person  Provider:   You may see [name], MD or one of the following Advanced Practice Providers on your designated Care Team:   Tommye Standard, [place]   Other Instructions   Important Information About Sugar

## 2022-05-26 ENCOUNTER — Encounter: Payer: Self-pay | Admitting: Family Medicine

## 2022-05-26 DIAGNOSIS — K2270 Barrett's esophagus without dysplasia: Secondary | ICD-10-CM | POA: Diagnosis not present

## 2022-05-26 DIAGNOSIS — R1310 Dysphagia, unspecified: Secondary | ICD-10-CM | POA: Diagnosis not present

## 2022-05-26 DIAGNOSIS — K219 Gastro-esophageal reflux disease without esophagitis: Secondary | ICD-10-CM | POA: Diagnosis not present

## 2022-05-29 ENCOUNTER — Telehealth: Payer: Self-pay | Admitting: Family Medicine

## 2022-05-29 NOTE — Telephone Encounter (Signed)
Pt has been having elevated glucose to 200-300s for a week. Denies any recent changes except feeling more thirsty. He has been taking glipizide and metformin as prescribed.  - Advised to increase metformin to 500 daily. Monitor for GI symptoms. - F/u next Wednesday to also discuss TSH results and adjust T2DM management
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
## 2022-06-02 ENCOUNTER — Ambulatory Visit: Payer: BC Managed Care – PPO | Admitting: Cardiology

## 2022-06-04 ENCOUNTER — Telehealth: Payer: Self-pay

## 2022-06-04 ENCOUNTER — Ambulatory Visit (INDEPENDENT_AMBULATORY_CARE_PROVIDER_SITE_OTHER): Payer: BC Managed Care – PPO | Admitting: Family Medicine

## 2022-06-04 ENCOUNTER — Encounter: Payer: Self-pay | Admitting: Family Medicine

## 2022-06-04 VITALS — BP 104/71 | HR 69 | Ht 67.0 in | Wt 149.0 lb

## 2022-06-04 DIAGNOSIS — G894 Chronic pain syndrome: Secondary | ICD-10-CM | POA: Diagnosis not present

## 2022-06-04 DIAGNOSIS — Z23 Encounter for immunization: Secondary | ICD-10-CM | POA: Diagnosis not present

## 2022-06-04 DIAGNOSIS — E119 Type 2 diabetes mellitus without complications: Secondary | ICD-10-CM

## 2022-06-04 DIAGNOSIS — E0590 Thyrotoxicosis, unspecified without thyrotoxic crisis or storm: Secondary | ICD-10-CM | POA: Diagnosis not present

## 2022-06-04 DIAGNOSIS — E1169 Type 2 diabetes mellitus with other specified complication: Secondary | ICD-10-CM | POA: Diagnosis not present

## 2022-06-04 MED ORDER — METFORMIN HCL ER 500 MG PO TB24: 1000.0000 mg | ORAL_TABLET | Freq: Every day | ORAL | 3 refills | Status: DC

## 2022-06-04 MED ORDER — HYDROCODONE-ACETAMINOPHEN 5-325 MG PO TABS: 1.0000 | ORAL_TABLET | Freq: Three times a day (TID) | ORAL | 0 refills | Status: DC | PRN

## 2022-06-04 NOTE — Assessment & Plan Note (Addendum)
TSH low and FT4 elevated. No nodularity or tenderness on exam. Amioderone induced thyrotoxicosis is considered. Will obtain further workup with I-131 scan. - I-131 scan ordered

## 2022-06-04 NOTE — Telephone Encounter (Signed)
Called patient to inform of upcoming imaging appointments, patient notified and said he would look into MyChart if needed other details.

## 2022-06-04 NOTE — Progress Notes (Signed)
    SUBJECTIVE:   CHIEF COMPLAINT / HPI: Diabetes, high sugars  Andrew Huerta is a 56yo M p/w elevated glucoses. He has recently started checking his glucose more often and noted them to be high. Glucometer showed readings in 200-300's, and pt reports highest was around 400s. He reports feeling dizzy, confused, tired, irritated sometimes, especially after meals. He has been waiting to eat at home because of these symptoms. He also reports feeling thirsty and peeing a lot. He denies GI symptoms from taking metformin, is willing to increase.  PERTINENT  PMH / PSH: T2DM, HTN, CHF, CAD, GERD,   OBJECTIVE:   BP 104/71   Pulse 69   Ht '5\' 7"'$  (1.702 m)   Wt 149 lb (67.6 kg)   SpO2 98%   BMI 23.34 kg/m   Gen: Alert, NAD.  HEENT: NCAT. No thyroid nodule or tenderness.  CV: RRR Resp: Normal WOB.  ASSESSMENT/PLAN:   Hyperthyroidism TSH low and FT4 elevated. No nodularity or tenderness on exam. Amioderone induced thyrotoxicosis is considered. Will obtain further workup with I-131 scan. - I-131 scan ordered  Controlled type 2 diabetes mellitus without complication, without long-term current use of insulin (HCC) Has recently started checking his glucose regularly and home glucose readings 200-300's (per glucometer). Reports highest being 400's. Reports increased thirst and urination. When sugars are high, he also reports feeling "cloudy" and irritated. Has tolerated metformin and denies GI symptoms.  - Increase metformin XR to 1000 daily.   - Instructed to continue for 2 wks and check glucoses at home. Pt will message via MyChart with glucose readings after 2wks; consider increasing to 1000 BID if glucose still elevated.  - Cont glipizide   [name], MD [place]

## 2022-06-04 NOTE — Patient Instructions (Signed)
Good to see you today - Thank you for coming in  Things we discussed today:  1) Diabetes: High sugars can cause you to feel irritable, confused, thirsty, and have to pee a lot.  - Increase your Metformin to two tablets a day ('1000mg'$ ). You can take them at the same time. - Continue taking your glipizide and steglatro - Continue checking your sugar every day for the next 2 weeks. Once you've been on the new dose of metformin for 2 weeks, message me your glucose readings. If it is still high, I may increase the metformin further.  2) Hyperthyroid: Your thyroid is high. We will get a scan of your neck to check your thyroid gland. This will give [place] better information on how to treat you.  Please always bring your medication bottles  Come back to see me in 3 months to recheck your A1c.

## 2022-06-07 NOTE — Assessment & Plan Note (Signed)
Has recently started checking his glucose regularly and home glucose readings 200-300's (per glucometer). Reports highest being 400's. Reports increased thirst and urination. When sugars are high, he also reports feeling "cloudy" and irritated. Has tolerated metformin and denies GI symptoms.  - Increase metformin XR to 1000 daily.   - Instructed to continue for 2 wks and check glucoses at home. Pt will message via MyChart with glucose readings after 2wks; consider increasing to 1000 BID if glucose still elevated.  - Cont glipizide

## 2022-06-11 ENCOUNTER — Other Ambulatory Visit (HOSPITAL_COMMUNITY): Payer: Self-pay | Admitting: Internal Medicine

## 2022-06-16 ENCOUNTER — Ambulatory Visit (INDEPENDENT_AMBULATORY_CARE_PROVIDER_SITE_OTHER): Payer: BC Managed Care – PPO

## 2022-06-16 ENCOUNTER — Encounter (HOSPITAL_COMMUNITY)
Admission: RE | Admit: 2022-06-16 | Discharge: 2022-06-16 | Disposition: A | Discharge status: Home / Self Care | Payer: BC Managed Care – PPO | Source: Ambulatory Visit | Attending: Family Medicine | Admitting: Family Medicine

## 2022-06-16 ENCOUNTER — Encounter (HOSPITAL_COMMUNITY): Payer: Self-pay

## 2022-06-16 ENCOUNTER — Encounter (HOSPITAL_COMMUNITY): Payer: BC Managed Care – PPO

## 2022-06-16 DIAGNOSIS — E0590 Thyrotoxicosis, unspecified without thyrotoxic crisis or storm: Secondary | ICD-10-CM | POA: Insufficient documentation

## 2022-06-16 DIAGNOSIS — I472 Ventricular tachycardia, unspecified: Secondary | ICD-10-CM | POA: Diagnosis not present

## 2022-06-17 ENCOUNTER — Encounter (HOSPITAL_COMMUNITY): Payer: BC Managed Care – PPO

## 2022-06-17 LAB — CUP PACEART REMOTE DEVICE CHECK
Battery Remaining Longevity: 174 mo
Battery Remaining Percentage: 100 %
Brady Statistic RV Percent Paced: 0 %
Date Time Interrogation Session: 20231016042200
HighPow Impedance: 74 Ohm
Implantable Lead Implant Date: 20230414
Implantable Lead Location: 753860
Implantable Lead Model: 672
Implantable Lead Serial Number: 182871
Implantable Pulse Generator Implant Date: 20230414
Lead Channel Impedance Value: 814 Ohm
Lead Channel Pacing Threshold Amplitude: 0.7 V
Lead Channel Pacing Threshold Pulse Width: 0.4 ms
Lead Channel Setting Pacing Amplitude: 2.5 V
Lead Channel Setting Pacing Pulse Width: 0.4 ms
Lead Channel Setting Sensing Sensitivity: 0.5 mV
Pulse Gen Serial Number: 310848

## 2022-06-26 ENCOUNTER — Encounter (HOSPITAL_COMMUNITY): Payer: BC Managed Care – PPO

## 2022-06-30 ENCOUNTER — Ambulatory Visit: Payer: BC Managed Care – PPO | Admitting: Physician Assistant

## 2022-07-06 ENCOUNTER — Other Ambulatory Visit (HOSPITAL_COMMUNITY): Payer: Self-pay | Admitting: Internal Medicine

## 2022-07-07 ENCOUNTER — Other Ambulatory Visit: Payer: Self-pay

## 2022-07-07 DIAGNOSIS — G894 Chronic pain syndrome: Secondary | ICD-10-CM

## 2022-07-07 MED ORDER — CLOPIDOGREL BISULFATE 75 MG PO TABS: 75.0000 mg | ORAL_TABLET | Freq: Every day | ORAL | 3 refills | Status: DC

## 2022-07-07 NOTE — Progress Notes (Signed)
Remote pacemaker transmission.   

## 2022-07-08 MED ORDER — HYDROCODONE-ACETAMINOPHEN 5-325 MG PO TABS: 1.0000 | ORAL_TABLET | Freq: Three times a day (TID) | ORAL | 0 refills | Status: DC | PRN

## 2022-07-10 ENCOUNTER — Telehealth: Payer: BC Managed Care – PPO | Admitting: Cardiology

## 2022-07-10 NOTE — Telephone Encounter (Signed)
Spoke with pt, aware of dr Doug Sou recommendations. He will check his blood pressure.

## 2022-07-10 NOTE — Telephone Encounter (Signed)
Pt c/o of Chest Pain: STAT if CP now or developed within 24 hours  1. Are you having CP right now?  No   2. Are you experiencing any other symptoms (ex. SOB, nausea, vomiting, sweating)?  Left hand numbness, cognitive issues--feels like he lacks mental clarity  3. How long have you been experiencing CP?  Occurred when patient was on the way to work this morning--lasted about 30 seconds   4. Is your CP continuous or coming and going?  Only occurred once, this morning   5. Have you taken Nitroglycerin?  No, states nitro drops his BP too low so he can not take it  ?

## 2022-07-10 NOTE — Telephone Encounter (Signed)
Spoke with pt, he reports this morning while driving to work he developed a pain in the left chest about 2 inches from his ICD. He said it felt sharpe and felt like he had been punched. It was a 7 to 8 on a scale 1-10. It last 30 sec and went away. He worked all day and now he feels tired and almost confused. He reports this was a different pain from his chronic chest pain that he has. He reports after he has pain he always gets a foggy feeling in his head. He also reports off and on tingling in his left hand and fingers that has been going on for sometime. He reports no stroke like symptoms. Aware will forward to dr Martinique for his review.

## 2022-07-15 ENCOUNTER — Encounter: Payer: BC Managed Care – PPO | Admitting: Cardiology

## 2022-07-31 ENCOUNTER — Other Ambulatory Visit: Payer: Self-pay | Admitting: Cardiology

## 2022-07-31 NOTE — Telephone Encounter (Signed)
This is a CHF pt, Dr. Haroldine Laws. Please address

## 2022-08-05 ENCOUNTER — Other Ambulatory Visit: Payer: Self-pay

## 2022-08-05 DIAGNOSIS — G894 Chronic pain syndrome: Secondary | ICD-10-CM

## 2022-08-06 MED ORDER — HYDROCODONE-ACETAMINOPHEN 5-325 MG PO TABS: 1.0000 | ORAL_TABLET | Freq: Three times a day (TID) | ORAL | 0 refills | Status: DC | PRN

## 2022-08-08 MED ORDER — HYDROCODONE-ACETAMINOPHEN 5-325 MG PO TABS: 1.0000 | ORAL_TABLET | Freq: Three times a day (TID) | ORAL | 0 refills | Status: DC | PRN

## 2022-08-08 NOTE — Addendum Note (Signed)
Addended by: [name] on: 08/08/2022 03:44 PM   Modules accepted: Orders

## 2022-08-08 NOTE — Telephone Encounter (Signed)
Patient calls nurse line regarding hydrocodone-acetaminophen. He states that he needs prescription to be sent to [facility]. Called [facility], canceled prescription.   Please send new rx to [facility].   Medication pended to this encounter.   Talbot Grumbling, RN

## 2022-08-08 NOTE — Addendum Note (Signed)
Addended by: Talbot Grumbling on: 08/08/2022 10:33 AM   Modules accepted: Orders

## 2022-08-10 NOTE — Progress Notes (Unsigned)
Cardiology Office Note Date:  08/10/2022  Patient ID:  Andrew Huerta, Andrew Huerta 1966/03/17, MRN [id number] PCP:  [name], MD  Cardiologist:  Dr. Martinique AHF: Dr. Haroldine Laws Electrophysiologist: Dr. Quentin Ore    Chief Complaint: ATP therapies  History of Present Illness: Andrew Huerta is a 56 y.o. male with history of hx of CAD (PCI to LAD/LCx 2015, PCI to RCA 2020, PCI to LAD 12/26/21), DM,  Barrett's esophagus,  adrenal tumor s/p adrenal gland resection at [facility], HTN, HLD, primary hyperaldosteronism s/p adrenalectomy, and OSA, VT  He last saw Dr. Quentin Ore July 2023, he was doing well, no VT.  Amio dose was reduced and planned for EP APP visit in 4 mo.  He saw Dr. Haroldine Laws July 2023, significant personal stressors, cares for his 2 adult sons (both with disabilities), home from work after his last hospitalization. Doing OK otherwise, last echo with some improvement in his EF.  No changes made, planned for CPX testing.  He saw [name] 04/02/22, c/w personal struggles, particularly with FMLA vs disability.  Atypical CPs. Prelim CPX with mod HF limitations. Mild TSH abnormalities following with PMD, maintained on amio '200mg'$  BID  Device clinic alert for VT/ATP therapy on 05/11/22, pt was asymptomatic, in review of med, only taking coreg daily and advised to take BID. No driving discussed.  I saw him 05/19/22 He was doing nothing in particular the day he got ATP's, was unaware. Though last week Thurs or Friday he was at work and when he turned to hand someone something he had sudden CP made his cough/almost retch/gag, and somewhat SOB. This he says is not new, and if anything is better then usual, reports he has had this on occassional for years, "no-one really knows what it is". He has not had any near syncope or syncope. He does get winded with more then casual exertion but is back to work at [employer] in [employer] and enjoys it. He gets some occasional L arm numbness, mostly when in bed at night,  this used to be regularly though went away for a couple months and happening again. Rarely gets it during the day. It is not exertional He is not sure why he was missing the second dose of coreg, was not intentional, and back on track with it Taking all of his medicines as prescribed  July episode he was in a confrontation of some kind getting a renter out of his home, was aware of the therapy 05/12/19, no symptoms Both looked the same MMVT No changes were made, No driving discussed,planned for labs  *** symptoms ***  ?new CP 11/9 *** VT *** volume *** meds, CAD, CM *** amio labs   Device information BSci single chamber ICD implanted 12/13/2021 Secondary prevention + appropriate therapies June 2023, VT Appropriate ATP 05/2022 (in environment of unintentional coreg non-compliance)  AAD Hx Amiodarone started April 2023  Past Medical History:  Diagnosis Date   Adrenal tumor    a. s/p adrenal gland resection at [facility]. left side removal per patient   Anxiety    Barrett's esophagus    EGD - 11/27/09 - short segment of Barrett's   CAD (coronary artery disease)    a. 04/27/14 [place] s/p DES to mLAD and DES to mLCx   Chronic back pain    upper and lower per patient   Chronic chest pain    Degenerative joint disease    Bilateral knees. Significant knee pain since playing football in high school.  also in back per patient   Depression    Diabetes mellitus type 2, controlled ([place]) 08/29/2015   Diagnosed by A1c 7.6% during 08/26/15 hospital admission for chest pain   Gastroesophageal reflux disease with hiatal hernia    GERD (gastroesophageal reflux disease)    Hiatal hernia    EGD - 11/27/2009   Hyperlipidemia    Hypertension    Low back pain    Primary hyperaldosteronism ([place]) 01/22/2012   S/P adrenalectomy      PTSD (post-traumatic stress disorder)    Seizures ([place])    Sleep apnea    Stone, kidney    Syncope 04/10/2019    Past Surgical History:  Procedure Laterality Date    ADRENALECTOMY  10/2013   CARDIAC CATHETERIZATION  05/01/2014   Patent stents, other disease unchanged   CARDIAC CATHETERIZATION  04/27/2014   Procedure: CORONARY STENT INTERVENTION;  Surgeon: Peter M Martinique, MD;  Location: [facility] CATH LAB;  Service: Cardiovascular;;  DES mid Cx  DES mid LAD   CARDIAC CATHETERIZATION N/A 03/22/2015   Procedure: Left Heart Cath and Coronary Angiography;  Surgeon: Sherren Mocha, MD;  Location: [place] CV LAB;  Service: Cardiovascular;  Laterality: N/A;   CORONARY ANGIOPLASTY WITH STENT PLACEMENT  04/27/2014   3.0 x 16 mm Promus DES to the mid LAD and 3.5 x 28 mm Promus to the mid LCx, otherwise 20-30 percent lesions, EF 55%   CORONARY STENT INTERVENTION N/A 01/04/2019   Procedure: CORONARY STENT INTERVENTION;  Surgeon: Troy Sine, MD;  Location: [place] CV LAB;  Service: Cardiovascular;  Laterality: N/A;   CORONARY STENT INTERVENTION N/A 12/26/2021   Procedure: CORONARY STENT INTERVENTION;  Surgeon: Martinique, Peter M, MD;  Location: [place] CV LAB;  Service: Cardiovascular;  Laterality: N/A;   ICD IMPLANT N/A 12/13/2021   Procedure: ICD IMPLANT;  Surgeon: [name], MD;  Location: [place] CV LAB;  Service: Cardiovascular;  Laterality: N/A;   KIDNEY STONE SURGERY  X 1   KNEE ARTHROPLASTY Right 1984   KNEE ARTHROSCOPY Right X 6   LEFT HEART CATH AND CORONARY ANGIOGRAPHY N/A 01/04/2019   Procedure: LEFT HEART CATH AND CORONARY ANGIOGRAPHY;  Surgeon: Troy Sine, MD;  Location: [place] CV LAB;  Service: Cardiovascular;  Laterality: N/A;   LEFT HEART CATHETERIZATION WITH CORONARY ANGIOGRAM N/A 04/27/2014   Procedure: LEFT HEART CATHETERIZATION WITH CORONARY ANGIOGRAM;  Surgeon: Peter M Martinique, MD;  Location: [facility] CATH LAB;  Service: Cardiovascular;  Laterality: N/A;   LEFT HEART CATHETERIZATION WITH CORONARY ANGIOGRAM N/A 05/01/2014   Procedure: LEFT HEART CATHETERIZATION WITH CORONARY ANGIOGRAM;  Surgeon: Burnell Blanks, MD;  Location: [facility]  CATH LAB;  Service: Cardiovascular;  Laterality: N/A;   LITHOTRIPSY  X 2   RIGHT/LEFT HEART CATH AND CORONARY ANGIOGRAPHY N/A 12/12/2021   Procedure: RIGHT/LEFT HEART CATH AND CORONARY ANGIOGRAPHY;  Surgeon: Jolaine Artist, MD;  Location: [place] CV LAB;  Service: Cardiovascular;  Laterality: N/A;    Current Outpatient Medications  Medication Sig Dispense Refill   amiodarone (PACERONE) 200 MG tablet Take 1 tablet (200 mg total) by mouth 2 (two) times daily. 60 tablet 2   aspirin EC 81 MG tablet Take 81 mg by mouth daily.     atorvastatin (LIPITOR) 80 MG tablet Take 1 tablet by mouth once daily 90 tablet 3   carvedilol (COREG) 3.125 MG tablet TAKE 1 TABLET BY MOUTH TWICE DAILY WITH A MEAL 180 tablet 3   clopidogrel (PLAVIX) 75 MG tablet Take  1 tablet (75 mg total) by mouth daily. 90 tablet 3   ertugliflozin L-PyroglutamicAc (STEGLATRO) 5 MG TABS tablet Take 1 tablet (5 mg total) by mouth daily before breakfast. 30 tablet 6   glipiZIDE (GLUCOTROL) 5 MG tablet Take 1 tablet (5 mg total) by mouth daily. 90 tablet 3   HYDROcodone-acetaminophen (NORCO/VICODIN) 5-325 MG tablet Take 1-2 tablets by mouth every 8 (eight) hours as needed for moderate pain. 80 tablet 0   metFORMIN (GLUCOPHAGE-XR) 500 MG 24 hr tablet Take 2 tablets (1,000 mg total) by mouth daily. 90 tablet 3   nitroGLYCERIN (NITROSTAT) 0.4 MG SL tablet Place 1 tablet (0.4 mg total) under the tongue every 5 (five) minutes as needed. 25 tablet 2   pantoprazole (PROTONIX) 40 MG tablet Take 1 tablet (40 mg total) by mouth daily. 60 tablet 2   sertraline (ZOLOFT) 100 MG tablet Take 2 tablets (200 mg total) by mouth daily. 30 tablet 3   sildenafil (VIAGRA) 50 MG tablet TAKE 1 TABLET BY MOUTH DAILY AS NEEDED FOR FOR ERECTILE DYSFUNCTION 20 tablet 3   sildenafil (VIAGRA) 50 MG tablet TAKE ONE TABLET BY MOUTH DAILY AS NEEDED FOR ERECTILE DYSFUNCTION 20 tablet 3   No current facility-administered medications for this visit.    Allergies:    Ozempic (0.25 or 0.5 mg-dose) [semaglutide(0.25 or 0.'5mg'$ -dos)], Cortisone acetate, Darvocet [propoxyphene n-acetaminophen], Nsaids, Spironolactone, Xanax [alprazolam], and Tape   Social History:  The patient  reports that he has never smoked. He has never used smokeless tobacco. He reports current alcohol use. He reports current drug use. Drug: Marijuana.   Family History:  The patient's family history includes Cancer in his father; Diabetes in his father and paternal grandmother; Heart attack in his paternal grandfather.  ROS:  Please see the history of present illness.    All other systems are reviewed and otherwise negative.   PHYSICAL EXAM:  VS:  There were no vitals taken for this visit. BMI: There is no height or weight on file to calculate BMI. Well nourished, well developed, in no acute distress HEENT: normocephalic, atraumatic Neck: no JVD, carotid bruits or masses Cardiac:  *** RRR; no significant murmurs, no rubs, or gallops Lungs:  *** CTA b/l, no wheezing, rhonchi or rales Abd: soft, nontender MS: no deformity or atrophy Ext: *** no edema Skin: warm and dry, no rash Neuro:  No gross deficits appreciated Psych: euthymic mood, full affect  *** ICD site is stable, no tethering or discomfort   EKG:  not done today  Device interrogation done today and reviewed by myself:  ***   04/01/2022: CPX Conclusion: Exercise testing with gas exchange demonstrates mild functional impairment when compared to matched sedentary norms. VE/VCO2 slope suggests primarily a more moderate HF limitation.  Test, report and preliminary impression by:  Kathy Breach, MS, ACSM-RCEP  04/01/2022 12:26 PM   Attending: Peak VO2 is only mildly reduced but VE/VCO2 is markedly elevated and suggests a more moderate HF limitation. At peak exercise, patient also reached his ventilatory limits. Glori Bickers, MD  10:20 PM    03/03/2022: TTE 1. Left ventricular ejection fraction, by estimation,  is 40 to 45%. The  left ventricle has mildly decreased function. The left ventricle  demonstrates regional wall motion abnormalities (see scoring  diagram/findings for description). Left ventricular  diastolic parameters are indeterminate.   2. Right ventricular systolic function is normal. The right ventricular  size is normal. Tricuspid regurgitation signal is inadequate for assessing  PA pressure.  3. Right atrial size was mildly dilated.   4. The mitral valve is normal in structure. No evidence of mitral valve  regurgitation. No evidence of mitral stenosis.   5. The aortic valve is tricuspid. Aortic valve regurgitation is not  visualized. Aortic valve sclerosis is present, with no evidence of aortic  valve stenosis.   6. The inferior vena cava is normal in size with greater than 50%  respiratory variability, suggesting right atrial pressure of 3 mmHg.    12/26/2021: LHC/PCI   Ost LAD to Prox LAD lesion is 90% stenosed.   A drug-eluting stent was successfully placed using a STENT ONYX FRONTIER 3.0X30.   Post intervention, there is a 0% residual stenosis.   Successful PCI of the proximal to mid LAD with OCT guidance, orbital atherectomy and DES x 1 overlapping with stent in the mid LAD.   12/12/21:R/LHC Mid LAD lesion is 30% stenosed.   Mid RCA to Dist RCA lesion is 100% stenosed.   Mid RCA lesion is 99% stenosed.   Prox RCA lesion is 40% stenosed.   Ost LAD to Prox LAD lesion is 90% stenosed.   Non-stenotic Prox LAD lesion was previously treated.   Non-stenotic Prox LAD to Mid LAD lesion was previously treated.   Non-stenotic Ost Cx to Prox Cx lesion was previously treated.   The left ventricular ejection fraction is 25-35% by visual estimate.   Findings:   Ao = 110/68 (86) LV = 115/16 RA = 10  RV = 30/13 PA = 28/15 (21) PCW = 14 Fick cardiac output/index = 5.2/2.9 PVR = 1.3 WU FA sat = 97% PA sat = 69%, 70% PAPi = 1.3   Assessment: 1. 3v CAD with 90% lesion in  proximal LAD prior to previous stent otherwise stable CAD with patency of previous LAD & LCx stents 2. iCM EF 30-35% 3. Relatively well-compensated hemodynamics with evidence of RV dysfunction   Plan/Discussion:    Suspect VT is scar-mediated and will need ICD. However, he also has significant progression of CAD in proximal LAD and will need PCI/atherectomy of this area. We will coordinate timing of ICD and PCI procedures to minimize risk.   12/11/21: TTE IMPRESSIONS   1. Left ventricular ejection fraction, by estimation, is 20 to 25%. The  left ventricle has severely decreased function. The left ventricle has no  regional wall motion abnormalities. Left ventricular diastolic parameters  are indeterminate. There is  akinesis of the left ventricular, basal-mid inferoseptal wall and  anteroseptal wall. There is akinesis of the left ventricular, apical  septal wall. There is akinesis of the left ventricular, mid-apical  inferior wall and anterior wall. There is  hypokinesis of the left ventricular, entire lateral wall and inferolateral  wall.   2. Right ventricular systolic function is mildly reduced. The right  ventricular size is mildly enlarged. The estimated right ventricular  systolic pressure is 31.5 mmHg.   3. Right atrial size was moderately dilated.   4. The mitral valve is normal in structure. Trivial mitral valve  regurgitation. No evidence of mitral stenosis.   5. The aortic valve is normal in structure. Aortic valve regurgitation is  not visualized. Aortic valve sclerosis is present, with no evidence of  aortic valve stenosis.   6. The inferior vena cava is dilated in size with <50% respiratory  variability, suggesting right atrial pressure of 15 mmHg.      Cath 01/04/2019 Mid RCA lesion is 99% stenosed. Mid RCA to Dist RCA lesion is  100% stenosed. Ost LAD to Prox LAD lesion is 40% stenosed. Previously placed Prox LAD stent (unknown type) is widely patent. Prox LAD to Mid  LAD lesion is 80% stenosed. A stent was successfully placed. Post intervention, there is a 0% residual stenosis. Post intervention, there is a 0% residual stenosis. Mid LAD lesion is 30% stenosed. Previously placed Ost Cx to Prox Cx stent (unknown type) is widely patent.   Multivessel CAD with 40% smooth ostial proximal stenosis of the LAD with a septal perforating artery that arises from the ostium and has a 90% stenosis (not able to be depicted in the diagram), widely patent stent proximally followed by new 80% stenosis the on the stented segment and proximal to the mid diagonal vessels.  There is 30% LAD stenosis beyond the mid diagonal vessels; patent moderate-sized ramus intermediate vessel; patent proximal left circumflex stent; and diffuse 99% to 100% occlusion of the mid distal RCA with evidence for moderate left to right collateralization from the LAD, septal perforating arteries, and LCX.   LVEDP 17 mmHg.   Successful PCI to the 80% LAD stenosis with ultimate insertion of a 2.5 x 18 mm Resolute Onyx DES stent postdilated to 2.6 mm with the 80% stenosis being reduced to 0%.   RECOMMENDATION: DAPT for minimum of 1 year and possibly long-term due to the patient's significant CAD history.  Aggressive lipid-lowering therapy with target LDL less than 70.  Medical therapy for RCA occlusion with collateralization.   Echo 03/01/20: IMPRESSIONS   1. Left ventricular ejection fraction, by estimation, is 40 to 45%. The  left ventricle has mildly decreased function. The left ventricle  demonstrates regional wall motion abnormalities (see scoring  diagram/findings for description). The left ventricular   internal cavity size was mildly dilated. Left ventricular diastolic  parameters are consistent with Grade I diastolic dysfunction (impaired  relaxation). There is severe hypokinesis of the left ventricular,  basal-mid inferoseptal wall and inferior wall.   2. Right ventricular systolic function is  normal. The right ventricular  size is normal.   3. The mitral valve is normal in structure. Trivial mitral valve  regurgitation.   4. The aortic valve is normal in structure. Aortic valve regurgitation is  not visualized.   5. The inferior vena cava is normal in size with greater than 50%  respiratory variability, suggesting right atrial pressure of 3 mmHg.   Comparison(s): Prior images reviewed side by side. The left ventricular  function has improved.   Event monitor 04/09/20: Study Highlights   Normal sinus rhythm Very rare PACs Occasional PVCs. burden 4%    Recent Labs: 01/28/2022: BNP 40.2; Hemoglobin 14.7; Platelets 196 03/17/2022: ALT 18 05/19/2022: BUN 15; Creatinine, Ser 0.98; Magnesium 2.2; Potassium 4.2; Sodium 139; TSH 0.410  12/12/2021: Cholesterol 197; HDL 48; LDL Cholesterol 130; Total CHOL/HDL Ratio 4.1; Triglycerides 95; VLDL 19   CrCl cannot be calculated (Patient's most recent lab result is older than the maximum 21 days allowed.).   Wt Readings from Last 3 Encounters:  06/04/22 149 lb (67.6 kg)  05/19/22 152 lb 9.6 oz (69.2 kg)  04/18/22 151 lb 6 oz (68.7 kg)     Other studies reviewed: Additional studies/records reviewed today include: summarized above  ASSESSMENT AND PLAN:  VT Amiodarone *** Labs  ICD *** Intact function *** No programming changes made  CAD *** No anginal sounding symptoms *** Seems to have this unusual chronic/intermittent CP, unchanged *** On ASA, plavix, BB, statin Dr. Martinique  ICM Chronic CHF ***  No symptoms or exam findings of volume OL *** Heart score is zero Dr. Haroldine Laws  Disposition: ***   Current medicines are reviewed at length with the patient today.  The patient did not have any concerns regarding medicines.  Venetia Night, PA-C 08/10/2022 8:47 AM     [address] [place] [address] [phone] (office)  [phone] (fax)

## 2022-08-11 ENCOUNTER — Ambulatory Visit: Payer: BC Managed Care – PPO | Attending: Physician Assistant | Admitting: Physician Assistant

## 2022-08-11 ENCOUNTER — Encounter: Payer: Self-pay | Admitting: Physician Assistant

## 2022-08-11 VITALS — BP 114/78 | HR 77 | Ht 67.0 in | Wt 148.4 lb

## 2022-08-11 DIAGNOSIS — I255 Ischemic cardiomyopathy: Secondary | ICD-10-CM

## 2022-08-11 DIAGNOSIS — I5022 Chronic systolic (congestive) heart failure: Secondary | ICD-10-CM

## 2022-08-11 DIAGNOSIS — Z95810 Presence of automatic (implantable) cardiac defibrillator: Secondary | ICD-10-CM

## 2022-08-11 DIAGNOSIS — I2510 Atherosclerotic heart disease of native coronary artery without angina pectoris: Secondary | ICD-10-CM | POA: Diagnosis not present

## 2022-08-11 DIAGNOSIS — R0789 Other chest pain: Secondary | ICD-10-CM

## 2022-08-11 DIAGNOSIS — I472 Ventricular tachycardia, unspecified: Secondary | ICD-10-CM

## 2022-08-11 DIAGNOSIS — Z79899 Other long term (current) drug therapy: Secondary | ICD-10-CM | POA: Diagnosis not present

## 2022-08-11 LAB — CUP PACEART INCLINIC DEVICE CHECK
Date Time Interrogation Session: 20231211105358
HighPow Impedance: 66 Ohm
HighPow Impedance: 73 Ohm
Implantable Lead Connection Status: 753985
Implantable Lead Implant Date: 20230414
Implantable Lead Location: 753860
Implantable Lead Model: 672
Implantable Lead Serial Number: 182871
Implantable Pulse Generator Implant Date: 20230414
Lead Channel Impedance Value: 831 Ohm
Lead Channel Pacing Threshold Amplitude: 0.7 V
Lead Channel Pacing Threshold Pulse Width: 0.4 ms
Lead Channel Sensing Intrinsic Amplitude: 20.8 mV
Lead Channel Setting Pacing Amplitude: 2.5 V
Lead Channel Setting Pacing Pulse Width: 0.4 ms
Lead Channel Setting Sensing Sensitivity: 0.5 mV
Pulse Gen Serial Number: 310848
Zone Setting Status: 755011

## 2022-08-11 MED ORDER — AMIODARONE HCL 200 MG PO TABS: 200.0000 mg | ORAL_TABLET | Freq: Every day | ORAL | 3 refills | Status: DC

## 2022-08-11 NOTE — Patient Instructions (Addendum)
Medication Instructions:   Your physician recommends that you continue on your current medications as directed. Please refer to the Current Medication list given to you today.  *If you need a refill on your cardiac medications before your next appointment, please call your pharmacy*   Lab Work:  TODAY:  CMET MAG AND TSH    If you have labs (blood work) drawn today and your tests are completely normal, you will receive your results only by: Page (if you have MyChart) OR A paper copy in the mail If you have any lab test that is abnormal or we need to change your treatment, we will call you to review the results.   Testing/Procedures: NONE ORDERED  TODAY    Follow-Up: At [facility], you and your health needs are our priority.  As part of our continuing mission to provide you with exceptional heart care, we have created designated Provider Care Teams.  These Care Teams include your primary Cardiologist (physician) and Advanced Practice Providers (APPs -  Physician Assistants and Nurse Practitioners) who all work together to provide you with the care you need, when you need it.  We recommend signing up for the patient portal called "MyChart".  Sign up information is provided on this After Visit Summary.  MyChart is used to connect with patients for Virtual Visits (Telemedicine).  Patients are able to view lab/test results, encounter notes, upcoming appointments, etc.  Non-urgent messages can be sent to your provider as well.   To learn more about what you can do with MyChart, go to [email or web address].    Your next appointment:   6 month(s)  The format for your next appointment:   In Person  Provider:   You may see [name], MD or one of the following Advanced Practice Providers on your designated Care Team:   Tommye Standard, [place]  Other Instructions  Important Information About Sugar

## 2022-08-12 LAB — COMPREHENSIVE METABOLIC PANEL
ALT: 15 IU/L (ref 0–44)
AST: 14 IU/L (ref 0–40)
Albumin/Globulin Ratio: 1.8 (ref 1.2–2.2)
Albumin: 4.2 g/dL (ref 3.8–4.9)
Alkaline Phosphatase: 96 IU/L (ref 44–121)
BUN/Creatinine Ratio: 13 (ref 9–20)
BUN: 14 mg/dL (ref 6–24)
Bilirubin Total: 0.3 mg/dL (ref 0.0–1.2)
CO2: 26 mmol/L (ref 20–29)
Calcium: 9.3 mg/dL (ref 8.7–10.2)
Chloride: 101 mmol/L (ref 96–106)
Creatinine, Ser: 1.05 mg/dL (ref 0.76–1.27)
Globulin, Total: 2.4 g/dL (ref 1.5–4.5)
Glucose: 203 mg/dL — ABNORMAL HIGH (ref 70–99)
Potassium: 4.6 mmol/L (ref 3.5–5.2)
Sodium: 139 mmol/L (ref 134–144)
Total Protein: 6.6 g/dL (ref 6.0–8.5)
eGFR: 83 mL/min/{1.73_m2} (ref >59)

## 2022-08-12 LAB — MAGNESIUM: Magnesium: 2.3 mg/dL (ref 1.6–2.3)

## 2022-08-12 LAB — TSH: TSH: 0.348 u[IU]/mL — ABNORMAL LOW (ref 0.450–4.500)

## 2022-08-13 ENCOUNTER — Other Ambulatory Visit: Payer: Self-pay | Admitting: *Deleted

## 2022-08-13 DIAGNOSIS — Z79899 Other long term (current) drug therapy: Secondary | ICD-10-CM

## 2022-08-15 NOTE — Progress Notes (Signed)
Results sent Via Mychart

## 2022-08-18 ENCOUNTER — Ambulatory Visit (INDEPENDENT_AMBULATORY_CARE_PROVIDER_SITE_OTHER): Payer: BC Managed Care – PPO | Admitting: Student

## 2022-08-18 ENCOUNTER — Encounter: Payer: Self-pay | Admitting: Student

## 2022-08-18 VITALS — BP 120/78 | HR 87 | Ht 67.0 in | Wt 144.0 lb

## 2022-08-18 DIAGNOSIS — G5603 Carpal tunnel syndrome, bilateral upper limbs: Secondary | ICD-10-CM | POA: Diagnosis not present

## 2022-08-18 NOTE — Assessment & Plan Note (Signed)
Worsening, apparently carpal tunnel splints were not beneficial in prior history.  Given weakness and consistent neuropathy, neck step would likely be hand surgery.  Referral placed.

## 2022-08-18 NOTE — Patient Instructions (Addendum)
It was great to see you today! Thank you for choosing [facility] for your primary care. Andrew Huerta was seen for carpal tunnel and I cannot stand.  Today we addressed: Carpal tunnel: This is definitely getting worse and you need to see a hand surgeon for carpal tunnel release.  I have placed this referral.  If you haven't already, sign up for My Chart to have easy access to your labs results, and communication with your primary care physician.  You should return to our clinic Return if symptoms worsen or fail to improve. Please arrive 15 minutes before your appointment to ensure smooth check in process.  We appreciate your efforts in making this happen.  Thank you for allowing me to participate in your care, Wells Guiles, DO 08/18/2022, 2:23 PM PGY-2, [place]

## 2022-08-18 NOTE — Progress Notes (Signed)
  SUBJECTIVE:   CHIEF COMPLAINT / HPI:   Hands numbing: Pt states he told his heart doctor about this. Patient notes he's had carpal tunnel for a "long time". It started getting worse in the last few months, began at the tips of his fingers and now involve his hands. His right hand/wrist throb and burn. Describes pins/needles tingling in fingertips all the time. Nothing helps, including  When he goes to make a fist, his knuckles feel tight. He has tried wearing hand splints in the past but it was uncomfortable. He is dropping galsses sometimes, driving when holding the steering wheel hurts his hands.   Head trauma: Patient notes that he hit the top of his head on his truck 3 months ago and the lacerations took a little bit of time to heal.  He believes they are taking longer than he would expect and he has been using triple antibiotic ointment on it.  Denies neck pain.   PERTINENT  PMH / PSH: uncontrolled T2DM, HLD, diabetic neuropathy; no surgical history involving upper extremities  OBJECTIVE:  BP 120/78   Pulse 87   Ht '5\' 7"'$  (1.702 m)   Wt 144 lb (65.3 kg)   SpO2 98%   BMI 22.55 kg/m  General: Awake, alert, NAD Derm: Well-healed scars with scabs present, see photo;  Wrist, bilateral: Inspection yielded no erythema, ecchymosis, bony deformity, or swelling. ROM full with good flexion and extension and ulnar/radial deviation that is symmetrical with opposite wrist.  Tingling sensation and numbness stated by patient when testing fingertips and right palmar surface.  Strength 5/5 in all directions without pain with exception of 4/5 finger to thumb and wrist extension bilaterally. Provocative testing demonstrates Tinel's test of wrist bilaterally.  No stretching of skin over knuckles appreciated.    ASSESSMENT/PLAN:  Carpal tunnel syndrome on both sides Assessment & Plan: Worsening, apparently carpal tunnel splints were not beneficial in prior history.  Given weakness and consistent  neuropathy, neck step would likely be hand surgery.  Referral placed.  Orders: -     Ambulatory referral to Hand Surgery  Prior head trauma Healing appropriately, no need for imaging.  Advised Vaseline or continue triple antibiotic ointment and not picking at scabs.  Likely taking longer considering is at the top of the scalp with already poor blood flow also evidenced by male pattern alopecia.  Do not suspect that this is related to worsening of carpal tunnel as no evidence of neck pain or findings above the wrist.  Return if symptoms worsen or fail to improve. Wells Guiles, DO 08/18/2022, 2:44 PM PGY-2, [place]

## 2022-08-22 ENCOUNTER — Telehealth: Payer: Self-pay | Admitting: Cardiology

## 2022-08-22 ENCOUNTER — Ambulatory Visit: Payer: BC Managed Care – PPO | Attending: Adult Health | Admitting: Adult Health

## 2022-08-22 ENCOUNTER — Ambulatory Visit (HOSPITAL_COMMUNITY)
Admission: RE | Admit: 2022-08-22 | Discharge: 2022-08-22 | Disposition: A | Discharge status: Home / Self Care | Payer: BC Managed Care – PPO | Source: Ambulatory Visit | Attending: Adult Health | Admitting: Adult Health

## 2022-08-22 ENCOUNTER — Encounter: Payer: Self-pay | Admitting: Adult Health

## 2022-08-22 VITALS — BP 118/78 | HR 68 | Ht 67.0 in | Wt 147.0 lb

## 2022-08-22 DIAGNOSIS — I82462 Acute embolism and thrombosis of left calf muscular vein: Secondary | ICD-10-CM | POA: Diagnosis not present

## 2022-08-22 DIAGNOSIS — I10 Essential (primary) hypertension: Secondary | ICD-10-CM

## 2022-08-22 DIAGNOSIS — E7800 Pure hypercholesterolemia, unspecified: Secondary | ICD-10-CM

## 2022-08-22 DIAGNOSIS — I2510 Atherosclerotic heart disease of native coronary artery without angina pectoris: Secondary | ICD-10-CM

## 2022-08-22 DIAGNOSIS — M79605 Pain in left leg: Secondary | ICD-10-CM | POA: Insufficient documentation

## 2022-08-22 NOTE — Telephone Encounter (Signed)
Pt c/o swelling: STAT is pt has developed SOB within 24 hours  If swelling, where is the swelling located?   Left leg and ankle  How much weight have you gained and in what time span?   Unknown  Have you gained 3 pounds in a day or 5 pounds in a week?   Unknown  Do you have a log of your daily weights (if so, list)?   He used to as his weight has been steady at around 144 lbs  Are you currently taking a fluid pill?   No  Are you currently SOB?   No  Have you traveled recently?   No   Patient stated his left leg was very swollen when he got home from work yesterday and the swelling has come down overnight but his ankle is still swollen.

## 2022-08-22 NOTE — Telephone Encounter (Signed)
Patient reported that last night, he noticed his left leg swollen and this AM, his left ankle is swollen with pain. He denies any worsening of SOB. On Plavix; no signs of bleeding. He asked to be seen. Appointment made with Arnold Long.
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
## 2022-08-22 NOTE — Patient Instructions (Signed)
Medication Instructions:  No Changes *If you need a refill on your cardiac medications before your next appointment, please call your pharmacy*   Lab Work: No labs If you have labs (blood work) drawn today and your tests are completely normal, you will receive your results only by: [place] (if you have MyChart) OR A paper copy in the mail If you have any lab test that is abnormal or we need to change your treatment, we will call you to review the results.   Testing/Procedures: [place], [address] Your physician has requested that you have a lower or upper extremity venous duplex. This test is an ultrasound of the veins in the legs or arms. It looks at venous blood flow that carries blood from the heart to the legs or arms. Allow one hour for a Lower Venous exam. Allow thirty minutes for an Upper Venous exam. There are no restrictions or special instructions.    Follow-Up: At [facility], you and your health needs are our priority.  As part of our continuing mission to provide you with exceptional heart care, we have created designated Provider Care Teams.  These Care Teams include your primary Cardiologist (physician) and Advanced Practice Providers (APPs -  Physician Assistants and Nurse Practitioners) who all work together to provide you with the care you need, when you need it.  We recommend signing up for the patient portal called "MyChart".  Sign up information is provided on this After Visit Summary.  MyChart is used to connect with patients for Virtual Visits (Telemedicine).  Patients are able to view lab/test results, encounter notes, upcoming appointments, etc.  Non-urgent messages can be sent to your provider as well.   To learn more about what you can do with MyChart, go to [email or web address].    Your next appointment:   1 month(s)  The format for your next appointment:   In Person  Provider:   Jory Sims, NP

## 2022-08-28 ENCOUNTER — Telehealth: Payer: Self-pay

## 2022-08-28 NOTE — Telephone Encounter (Addendum)
Results dicussed with patient by provider via telephone.----- Message from Lendon Colonel, NP sent at 08/22/2022  1:16 PM EST ----- I have called the patient and given him the results of his ultrasound, with no evidence of DVT.  He verbalized understanding. He was to continue to monitor the swelling, and wear compression socks as he is on his feet a lot for work.

## 2022-09-05 ENCOUNTER — Other Ambulatory Visit: Payer: Self-pay

## 2022-09-05 DIAGNOSIS — G894 Chronic pain syndrome: Secondary | ICD-10-CM

## 2022-09-05 MED ORDER — HYDROCODONE-ACETAMINOPHEN 5-325 MG PO TABS: 1.0000 | ORAL_TABLET | Freq: Three times a day (TID) | ORAL | 0 refills | Status: DC | PRN

## 2022-09-08 DIAGNOSIS — M7711 Lateral epicondylitis, right elbow: Secondary | ICD-10-CM | POA: Diagnosis not present

## 2022-09-08 DIAGNOSIS — G5603 Carpal tunnel syndrome, bilateral upper limbs: Secondary | ICD-10-CM | POA: Diagnosis not present

## 2022-09-15 ENCOUNTER — Ambulatory Visit (INDEPENDENT_AMBULATORY_CARE_PROVIDER_SITE_OTHER): Payer: BC Managed Care – PPO

## 2022-09-15 DIAGNOSIS — I472 Ventricular tachycardia, unspecified: Secondary | ICD-10-CM

## 2022-09-16 LAB — CUP PACEART REMOTE DEVICE CHECK
Battery Remaining Longevity: 174 mo
Battery Remaining Percentage: 100 %
Brady Statistic RV Percent Paced: 0 %
Date Time Interrogation Session: 20240115042000
HighPow Impedance: 73 Ohm
Implantable Lead Connection Status: 753985
Implantable Lead Implant Date: 20230414
Implantable Lead Location: 753860
Implantable Lead Model: 672
Implantable Lead Serial Number: 182871
Implantable Pulse Generator Implant Date: 20230414
Lead Channel Impedance Value: 794 Ohm
Lead Channel Pacing Threshold Amplitude: 0.7 V
Lead Channel Pacing Threshold Pulse Width: 0.4 ms
Lead Channel Setting Pacing Amplitude: 2.5 V
Lead Channel Setting Pacing Pulse Width: 0.4 ms
Lead Channel Setting Sensing Sensitivity: 0.5 mV
Pulse Gen Serial Number: 310848
Zone Setting Status: 755011
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
## 2022-09-25 DIAGNOSIS — G5603 Carpal tunnel syndrome, bilateral upper limbs: Secondary | ICD-10-CM | POA: Diagnosis not present

## 2022-09-26 ENCOUNTER — Ambulatory Visit: Payer: BC Managed Care – PPO | Attending: Adult Health | Admitting: Adult Health

## 2022-09-26 ENCOUNTER — Encounter: Payer: Self-pay | Admitting: Adult Health

## 2022-09-26 VITALS — BP 120/82 | HR 75 | Ht 67.0 in | Wt 148.2 lb

## 2022-09-26 DIAGNOSIS — I472 Ventricular tachycardia, unspecified: Secondary | ICD-10-CM

## 2022-09-26 DIAGNOSIS — I255 Ischemic cardiomyopathy: Secondary | ICD-10-CM

## 2022-09-26 DIAGNOSIS — Z95810 Presence of automatic (implantable) cardiac defibrillator: Secondary | ICD-10-CM

## 2022-09-26 DIAGNOSIS — I2510 Atherosclerotic heart disease of native coronary artery without angina pectoris: Secondary | ICD-10-CM

## 2022-09-26 DIAGNOSIS — E7800 Pure hypercholesterolemia, unspecified: Secondary | ICD-10-CM

## 2022-09-26 DIAGNOSIS — I10 Essential (primary) hypertension: Secondary | ICD-10-CM | POA: Diagnosis not present

## 2022-09-29 ENCOUNTER — Telehealth: Payer: Self-pay | Admitting: Cardiology

## 2022-09-29 DIAGNOSIS — G5603 Carpal tunnel syndrome, bilateral upper limbs: Secondary | ICD-10-CM | POA: Diagnosis not present

## 2022-09-29 DIAGNOSIS — M7711 Lateral epicondylitis, right elbow: Secondary | ICD-10-CM | POA: Diagnosis not present

## 2022-09-29 DIAGNOSIS — M25531 Pain in right wrist: Secondary | ICD-10-CM | POA: Diagnosis not present

## 2022-09-29 NOTE — Telephone Encounter (Signed)
Dr. Purcell Nails,   [place] saw this patient on 09/26/22, we have now received a request for medical clearance for carpal tunnel release. Would you please advise on medical clearance? I am routing request for DAPT to primary cardiologist.  Please route response to P CV DIV Preop  Thank you, Trudi Ida, NP

## 2022-09-29 NOTE — Telephone Encounter (Signed)
Dr. Martinique,  We received a request for Andrew Huerta to undergo carpal tunnel release.  He had a R/LHC on 12/12/21: 1. 3v CAD with 90% lesion in proximal LAD prior to previous stent otherwise stable CAD with patency of previous LAD & LCx stents 2. iCM EF 30-35% 3. Relatively well-compensated hemodynamics with evidence of RV dysfunction   Suspect VT is scar-mediated and will need ICD. However, he also has significant progression of CAD in proximal LAD and will need PCI/atherectomy of this area. We will coordinate timing of ICD and PCI procedures to minimize risk.   12/13/21 ICD implanted - Successful ICD implantation with a [employer] VVI ICD implanted for secondary prevention of sudden death.   01-23-22 Coronary stent placement -    Ost LAD to Prox LAD lesion is 90% stenosed.   A drug-eluting stent was successfully placed using a STENT ONYX FRONTIER 3.0X30.   Post intervention, there is a 0% residual stenosis.   Successful PCI of the proximal to mid LAD with OCT guidance, orbital atherectomy and DES x 1 overlapping with stent in the mid LAD.   Plan: anticipate same day DC. DAPT with ASA and Plavix indefinitely given multiple stents.    What is your recommendation regarding appropriate timing for holding ASA & Plavix?  Please route your response to P CV DIV Preop.  Thank you, Anderson Malta

## 2022-09-29 NOTE — Telephone Encounter (Signed)
   Pre-operative Risk Assessment    Patient Name: Andrew Huerta  DOB: Sep 16, 1965 MRN: [id number]      Request for Surgical Clearance    Procedure:   Right Carpal Tunnel Release   Date of Surgery:  Clearance 10/27/22                                 Surgeon:  Dr. Ala Bent  Surgeon's Group or Practice Name:  [facility]  Phone number:  [phone] Fax number:  [phone]    Type of Clearance Requested:   - Medical  - Pharmacy:  Hold Aspirin and Clopidogrel (Plavix)     Type of Anesthesia:   choice    Additional requests/questions:    [name]   09/29/2022, 1:50 PM

## 2022-10-03 NOTE — Telephone Encounter (Signed)
   Name: Andrew Huerta  DOB: [birth year]  MRN: [id number]   Primary Cardiologist: Peter Martinique, MD  Chart reviewed as part of pre-operative protocol coverage. Patient was contacted 10/03/2022 in reference to pre-operative risk assessment for pending surgery as outlined below.  Andrew Huerta was last seen on 09/26/2022 by Bunnie Domino, NP.  Since that day, JOHNNEY Huerta has done well without exertional chest pain or worsening dyspnea.  Overall, he is stable from the cardiac perspective.  He did mention his blood pressure was 98/67 when he saw his orthopedic doctor, he does have fatigue after work.  His blood pressure has been previously normal during cardiology office visit.  He denies any significant dizziness or feeling of passing out.  I asked him to continue to keep an eye on the blood pressure, if systolic blood pressure is below 100 mmHg on multiple checks, he has been advised to check with cardiology service.  Therefore, based on ACC/AHA guidelines, the patient would be at acceptable risk for the planned procedure without further cardiovascular testing.   The was reviewed by Dr. Martinique, he is okay to hold Plavix for 5 days prior to the upcoming carpal tunnel surgery and restart as soon as possible afterward at the surgeon's discretion.  Given his multiple cardiac stents in the past, we recommend he continue on the aspirin through the procedure.  The patient was advised that if he develops new symptoms prior to surgery to contact our office to arrange for a follow-up visit, and he verbalized understanding.  I will route this recommendation to the requesting party via Epic fax function and remove from pre-op pool. Please call with questions.  [place], [place] 10/03/2022, 1:40 PM

## 2022-10-08 ENCOUNTER — Other Ambulatory Visit: Payer: Self-pay

## 2022-10-08 DIAGNOSIS — G894 Chronic pain syndrome: Secondary | ICD-10-CM

## 2022-10-09 MED ORDER — HYDROCODONE-ACETAMINOPHEN 5-325 MG PO TABS: 1.0000 | ORAL_TABLET | Freq: Three times a day (TID) | ORAL | 0 refills | Status: DC | PRN

## 2022-10-21 ENCOUNTER — Other Ambulatory Visit: Payer: Self-pay | Admitting: Family Medicine

## 2022-10-21 DIAGNOSIS — N521 Erectile dysfunction due to diseases classified elsewhere: Secondary | ICD-10-CM

## 2022-10-27 DIAGNOSIS — G5603 Carpal tunnel syndrome, bilateral upper limbs: Secondary | ICD-10-CM | POA: Diagnosis not present

## 2022-10-27 DIAGNOSIS — M659 Synovitis and tenosynovitis, unspecified: Secondary | ICD-10-CM | POA: Diagnosis not present

## 2022-10-27 DIAGNOSIS — G5601 Carpal tunnel syndrome, right upper limb: Secondary | ICD-10-CM | POA: Diagnosis not present

## 2022-10-27 DIAGNOSIS — M7711 Lateral epicondylitis, right elbow: Secondary | ICD-10-CM | POA: Diagnosis not present

## 2022-10-27 DIAGNOSIS — Z01810 Encounter for preprocedural cardiovascular examination: Secondary | ICD-10-CM | POA: Diagnosis not present

## 2022-10-28 NOTE — Progress Notes (Signed)
Remote pacemaker transmission.   

## 2022-11-06 ENCOUNTER — Other Ambulatory Visit: Payer: Self-pay

## 2022-11-06 DIAGNOSIS — G894 Chronic pain syndrome: Secondary | ICD-10-CM

## 2022-11-06 DIAGNOSIS — G5603 Carpal tunnel syndrome, bilateral upper limbs: Secondary | ICD-10-CM | POA: Diagnosis not present

## 2022-11-06 MED ORDER — HYDROCODONE-ACETAMINOPHEN 5-325 MG PO TABS: 1.0000 | ORAL_TABLET | Freq: Three times a day (TID) | ORAL | 0 refills | Status: DC | PRN

## 2022-11-10 ENCOUNTER — Other Ambulatory Visit: Payer: Self-pay

## 2022-11-10 DIAGNOSIS — N521 Erectile dysfunction due to diseases classified elsewhere: Secondary | ICD-10-CM

## 2022-11-11 DIAGNOSIS — M79641 Pain in right hand: Secondary | ICD-10-CM | POA: Diagnosis not present

## 2022-11-11 DIAGNOSIS — G5601 Carpal tunnel syndrome, right upper limb: Secondary | ICD-10-CM | POA: Diagnosis not present

## 2022-11-11 DIAGNOSIS — M25631 Stiffness of right wrist, not elsewhere classified: Secondary | ICD-10-CM | POA: Diagnosis not present

## 2022-11-11 DIAGNOSIS — M25642 Stiffness of left hand, not elsewhere classified: Secondary | ICD-10-CM | POA: Diagnosis not present

## 2022-11-11 MED ORDER — SILDENAFIL CITRATE 50 MG PO TABS: 50.0000 mg | ORAL_TABLET | Freq: Every day | ORAL | 1 refills | Status: DC | PRN

## 2022-12-01 DIAGNOSIS — G5603 Carpal tunnel syndrome, bilateral upper limbs: Secondary | ICD-10-CM | POA: Diagnosis not present

## 2022-12-05 ENCOUNTER — Other Ambulatory Visit: Payer: Self-pay

## 2022-12-05 DIAGNOSIS — G894 Chronic pain syndrome: Secondary | ICD-10-CM

## 2022-12-05 MED ORDER — HYDROCODONE-ACETAMINOPHEN 5-325 MG PO TABS: 1.0000 | ORAL_TABLET | Freq: Three times a day (TID) | ORAL | 0 refills | Status: DC | PRN

## 2022-12-11 ENCOUNTER — Other Ambulatory Visit (HOSPITAL_COMMUNITY): Payer: Self-pay

## 2022-12-11 ENCOUNTER — Telehealth: Payer: Self-pay

## 2022-12-11 ENCOUNTER — Other Ambulatory Visit: Payer: Self-pay | Admitting: Family Medicine

## 2022-12-11 DIAGNOSIS — N521 Erectile dysfunction due to diseases classified elsewhere: Secondary | ICD-10-CM

## 2022-12-11 NOTE — Telephone Encounter (Signed)
A Prior Authorization was initiated for this patients HYDROCODONE-ACETAMINOPHEN through CoverMyMeds.   Key: [id number]

## 2022-12-12 NOTE — Telephone Encounter (Signed)
Prior Auth for patients medication HYDROCODONE-ACETA. approved by [employer] from 12/11/22 to 06/12/23.  CoverMyMeds Key: [id number] PA Case ID #: [id number]

## 2022-12-15 ENCOUNTER — Ambulatory Visit (INDEPENDENT_AMBULATORY_CARE_PROVIDER_SITE_OTHER): Payer: BC Managed Care – PPO

## 2022-12-15 DIAGNOSIS — I472 Ventricular tachycardia, unspecified: Secondary | ICD-10-CM

## 2022-12-16 LAB — CUP PACEART REMOTE DEVICE CHECK
Battery Remaining Longevity: 174 mo
Battery Remaining Percentage: 100 %
Brady Statistic RV Percent Paced: 0 %
Date Time Interrogation Session: 20240415042100
HighPow Impedance: 64 Ohm
Implantable Lead Connection Status: 753985
Implantable Lead Implant Date: 20230414
Implantable Lead Location: 753860
Implantable Lead Model: 672
Implantable Lead Serial Number: 182871
Implantable Pulse Generator Implant Date: 20230414
Lead Channel Impedance Value: 754 Ohm
Lead Channel Pacing Threshold Amplitude: 0.7 V
Lead Channel Pacing Threshold Pulse Width: 0.4 ms
Lead Channel Setting Pacing Amplitude: 2.5 V
Lead Channel Setting Pacing Pulse Width: 0.4 ms
Lead Channel Setting Sensing Sensitivity: 0.5 mV
Pulse Gen Serial Number: 310848
Zone Setting Status: 755011

## 2023-01-05 ENCOUNTER — Other Ambulatory Visit: Payer: Self-pay

## 2023-01-05 DIAGNOSIS — G894 Chronic pain syndrome: Secondary | ICD-10-CM

## 2023-01-06 ENCOUNTER — Telehealth: Payer: Self-pay

## 2023-01-06 ENCOUNTER — Other Ambulatory Visit: Payer: Self-pay | Admitting: Family Medicine

## 2023-01-06 DIAGNOSIS — N521 Erectile dysfunction due to diseases classified elsewhere: Secondary | ICD-10-CM

## 2023-01-06 DIAGNOSIS — G894 Chronic pain syndrome: Secondary | ICD-10-CM

## 2023-01-06 MED ORDER — HYDROCODONE-ACETAMINOPHEN 5-325 MG PO TABS: 1.0000 | ORAL_TABLET | Freq: Three times a day (TID) | ORAL | 0 refills | Status: DC | PRN

## 2023-01-06 NOTE — Telephone Encounter (Signed)
Patient calls nurse line requesting Hydrocodone prescription be sent to [facility].   He reports the medication is cheaper there.   I have called [facility] and cancelled prescription.   Please send to [facility].

## 2023-01-07 MED ORDER — HYDROCODONE-ACETAMINOPHEN 5-325 MG PO TABS
1.0000 | ORAL_TABLET | Freq: Three times a day (TID) | ORAL | 0 refills | Status: DC | PRN
Start: 2023-01-07 — End: 2023-02-04

## 2023-01-19 NOTE — Progress Notes (Signed)
Remote pacemaker transmission.   

## 2023-02-04 ENCOUNTER — Other Ambulatory Visit: Payer: Self-pay | Admitting: Family Medicine

## 2023-02-04 DIAGNOSIS — G894 Chronic pain syndrome: Secondary | ICD-10-CM

## 2023-02-04 MED ORDER — HYDROCODONE-ACETAMINOPHEN 5-325 MG PO TABS
1.0000 | ORAL_TABLET | Freq: Three times a day (TID) | ORAL | 0 refills | Status: DC | PRN
Start: 2023-02-04 — End: 2023-03-03

## 2023-02-21 DIAGNOSIS — Z95810 Presence of automatic (implantable) cardiac defibrillator: Secondary | ICD-10-CM | POA: Diagnosis not present

## 2023-02-21 DIAGNOSIS — R079 Chest pain, unspecified: Secondary | ICD-10-CM | POA: Diagnosis not present

## 2023-02-21 DIAGNOSIS — I252 Old myocardial infarction: Secondary | ICD-10-CM | POA: Diagnosis not present

## 2023-02-21 DIAGNOSIS — R9431 Abnormal electrocardiogram [ECG] [EKG]: Secondary | ICD-10-CM | POA: Diagnosis not present

## 2023-02-21 DIAGNOSIS — R0789 Other chest pain: Secondary | ICD-10-CM | POA: Diagnosis not present

## 2023-02-21 DIAGNOSIS — R110 Nausea: Secondary | ICD-10-CM | POA: Diagnosis not present

## 2023-02-21 DIAGNOSIS — Z950 Presence of cardiac pacemaker: Secondary | ICD-10-CM | POA: Diagnosis not present

## 2023-02-21 DIAGNOSIS — Z955 Presence of coronary angioplasty implant and graft: Secondary | ICD-10-CM | POA: Diagnosis not present

## 2023-02-24 ENCOUNTER — Telehealth: Payer: Self-pay | Admitting: Cardiology

## 2023-02-24 NOTE — Telephone Encounter (Signed)
Patient states he was admitted to [facility] Saturday and he would like to know if we can provide a note for work.

## 2023-02-24 NOTE — Telephone Encounter (Signed)
Called patient left message on voice mail to call back. 

## 2023-02-24 NOTE — Telephone Encounter (Signed)
Patient reports that on Saturday he was at work and because to sweat profusely. He vomited and had some chest discomfort. He went to the ER. He was advised that it was most likely heat related. On Sunday he felt very tired and sluggish so he did not go to work. He is wanting a work note for this. Advised that since we did not evaluate him or advise him to be out of work we likely would not be able to write a note for him. Today he is feeling better. He has been resting and staying hydrated. He was advised to follow up with cardiology. I offered him an appointment later this week, however he relies on others to drive him placed. He will check to see if he can get a ride and call back.

## 2023-03-03 ENCOUNTER — Other Ambulatory Visit: Payer: Self-pay | Admitting: Family Medicine

## 2023-03-03 DIAGNOSIS — G894 Chronic pain syndrome: Secondary | ICD-10-CM

## 2023-03-03 MED ORDER — HYDROCODONE-ACETAMINOPHEN 5-325 MG PO TABS
1.0000 | ORAL_TABLET | Freq: Three times a day (TID) | ORAL | 0 refills | Status: DC | PRN
Start: 2023-03-03 — End: 2023-04-03

## 2023-03-06 ENCOUNTER — Other Ambulatory Visit (HOSPITAL_COMMUNITY): Payer: Self-pay

## 2023-03-10 NOTE — Telephone Encounter (Signed)
Patient never returned call  

## 2023-03-16 ENCOUNTER — Ambulatory Visit (INDEPENDENT_AMBULATORY_CARE_PROVIDER_SITE_OTHER): Payer: BC Managed Care – PPO

## 2023-03-16 DIAGNOSIS — I255 Ischemic cardiomyopathy: Secondary | ICD-10-CM | POA: Diagnosis not present

## 2023-03-16 DIAGNOSIS — I472 Ventricular tachycardia, unspecified: Secondary | ICD-10-CM

## 2023-03-17 LAB — CUP PACEART REMOTE DEVICE CHECK
Battery Remaining Longevity: 168 mo
Battery Remaining Percentage: 100 %
Brady Statistic RV Percent Paced: 0 %
Date Time Interrogation Session: 20240715042200
HighPow Impedance: 72 Ohm
Implantable Lead Connection Status: 753985
Implantable Lead Implant Date: 20230414
Implantable Lead Location: 753860
Implantable Lead Model: 672
Implantable Lead Serial Number: 182871
Implantable Pulse Generator Implant Date: 20230414
Lead Channel Impedance Value: 758 Ohm
Lead Channel Pacing Threshold Amplitude: 0.7 V
Lead Channel Pacing Threshold Pulse Width: 0.4 ms
Lead Channel Setting Pacing Amplitude: 2.5 V
Lead Channel Setting Pacing Pulse Width: 0.4 ms
Lead Channel Setting Sensing Sensitivity: 0.5 mV
Pulse Gen Serial Number: 310848
Zone Setting Status: 755011

## 2023-03-30 NOTE — Progress Notes (Signed)
Remote ICD transmission.   

## 2023-04-03 ENCOUNTER — Other Ambulatory Visit: Payer: Self-pay | Admitting: Family Medicine

## 2023-04-03 DIAGNOSIS — G894 Chronic pain syndrome: Secondary | ICD-10-CM

## 2023-04-06 MED ORDER — HYDROCODONE-ACETAMINOPHEN 5-325 MG PO TABS
1.0000 | ORAL_TABLET | Freq: Three times a day (TID) | ORAL | 0 refills | Status: DC | PRN
Start: 2023-04-06 — End: 2023-05-06

## 2023-05-06 ENCOUNTER — Other Ambulatory Visit: Payer: Self-pay | Admitting: Family Medicine

## 2023-05-06 DIAGNOSIS — G894 Chronic pain syndrome: Secondary | ICD-10-CM

## 2023-05-06 MED ORDER — HYDROCODONE-ACETAMINOPHEN 5-325 MG PO TABS
1.0000 | ORAL_TABLET | Freq: Three times a day (TID) | ORAL | 0 refills | Status: DC | PRN
Start: 2023-05-06 — End: 2023-06-05

## 2023-05-19 ENCOUNTER — Other Ambulatory Visit: Payer: Self-pay

## 2023-05-19 DIAGNOSIS — Z79899 Other long term (current) drug therapy: Secondary | ICD-10-CM

## 2023-05-19 MED ORDER — AMIODARONE HCL 200 MG PO TABS
200.0000 mg | ORAL_TABLET | Freq: Every day | ORAL | 0 refills | Status: DC
Start: 2023-05-19 — End: 2023-08-14

## 2023-06-05 ENCOUNTER — Other Ambulatory Visit: Payer: Self-pay | Admitting: Family Medicine

## 2023-06-05 DIAGNOSIS — G894 Chronic pain syndrome: Secondary | ICD-10-CM

## 2023-06-08 MED ORDER — HYDROCODONE-ACETAMINOPHEN 5-325 MG PO TABS
1.0000 | ORAL_TABLET | Freq: Three times a day (TID) | ORAL | 0 refills | Status: DC | PRN
Start: 2023-06-08 — End: 2023-07-07

## 2023-06-15 ENCOUNTER — Ambulatory Visit (INDEPENDENT_AMBULATORY_CARE_PROVIDER_SITE_OTHER): Payer: BC Managed Care – PPO

## 2023-06-15 DIAGNOSIS — I255 Ischemic cardiomyopathy: Secondary | ICD-10-CM

## 2023-06-15 DIAGNOSIS — I472 Ventricular tachycardia, unspecified: Secondary | ICD-10-CM

## 2023-06-16 ENCOUNTER — Ambulatory Visit: Payer: BC Managed Care – PPO

## 2023-06-16 ENCOUNTER — Encounter: Payer: Self-pay | Admitting: Family Medicine

## 2023-06-16 VITALS — BP 123/80 | HR 72 | Ht 67.0 in | Wt 147.2 lb

## 2023-06-16 DIAGNOSIS — M79641 Pain in right hand: Secondary | ICD-10-CM | POA: Diagnosis not present

## 2023-06-16 DIAGNOSIS — G629 Polyneuropathy, unspecified: Secondary | ICD-10-CM | POA: Diagnosis not present

## 2023-06-16 DIAGNOSIS — E0842 Diabetes mellitus due to underlying condition with diabetic polyneuropathy: Secondary | ICD-10-CM

## 2023-06-16 DIAGNOSIS — M79642 Pain in left hand: Secondary | ICD-10-CM | POA: Diagnosis not present

## 2023-06-16 LAB — POCT GLYCOSYLATED HEMOGLOBIN (HGB A1C): HbA1c, POC (controlled diabetic range): 7.4 % — AB (ref 0.0–7.0)

## 2023-06-16 MED ORDER — ZIKS ARTHRITIS PAIN RELIEF 0.025-1-12 % EX CREA
1.0000 | TOPICAL_CREAM | CUTANEOUS | 1 refills | Status: DC | PRN
Start: 2023-06-16 — End: 2024-05-11

## 2023-06-16 NOTE — Patient Instructions (Signed)
It was wonderful to see you today.  Please bring ALL of your medications with you to every visit.   Today we talked about:  Hand pain - I am getting some labs as well as xrays. We will follow this up. Also use the capsacin cream. I have also referred you to neurology for the weakness in your hands   Please make an appointment with your primary care doctor and follow up with your heart failure team for physical and general heart failure care.   Thank you for choosing [facility].   Please call [phone] with any questions about today's appointment.  Please be sure to schedule follow up at the front desk before you leave today.   Lockie Mola, MD  Family Medicine

## 2023-06-16 NOTE — Progress Notes (Unsigned)
    SUBJECTIVE:   CHIEF COMPLAINT / HPI:   Hand Pain, weakness, neu Patient reports that he has pain in both of his hands. Reports he has been having this pain for the last 6 months. Reports that it has worsened since his carpal tunnel release surgery 6 months ago. Says he cannot straighten some of his fingers. Additionally reports tingling and numbness in both hands. Says he has dropped items because of weakness in his hands. Pain wakes him up at night. Symptoms are all worse in R hand than left. Patient says he has difficulty feeding himself or holding a pencil.   PERTINENT  PMH / PSH: HFrEF, DM, Bilateral carpal tunnel   OBJECTIVE:   BP 123/80   Pulse 72   Ht 5\' 7"  (1.702 m)   Wt 147 lb 3.2 oz (66.8 kg)   SpO2 98%   BMI 23.05 kg/m   General: well appearing, in no acute distress CV: RRR, radial pulses equal and palpable Hands: R hand with bend at the PIPs of 2nd through 4th fingers, pain with attempting to straighten, warmth at PIPs and DIP, erythema or edema  L hand with some hend in PIPs not as painful, pain to palpation of both wrists.  No warmth or edema of other joints Neuro: Alert & Oriented x 4, hands exam limited secondary to pain    ASSESSMENT/PLAN:   Assessment & Plan Bilateral hand pain Hand pain could be due to RA as patient is younger and has warmth in his PIPs. Most likely weakness in hands is due to pain rather than true weakness.  Herby Abraham  - Neurology referral for nerve testing  - RPR, A1c, B12, TSH, CBC - Patient declined medications such as gabapentin, cymbalta, voltaren gel, amitrypitiline as they have not worked well for him in the past, will try capsicin cream at this time  - Follow up with PCP      Lockie Mola, MD [facility]

## 2023-06-17 LAB — CUP PACEART REMOTE DEVICE CHECK
Battery Remaining Longevity: 162 mo
Battery Remaining Percentage: 100 %
Brady Statistic RV Percent Paced: 0 %
Date Time Interrogation Session: 20241014042200
HighPow Impedance: 72 Ohm
Implantable Lead Connection Status: 753985
Implantable Lead Implant Date: 20230414
Implantable Lead Location: 753860
Implantable Lead Model: 672
Implantable Lead Serial Number: 182871
Implantable Pulse Generator Implant Date: 20230414
Lead Channel Impedance Value: 768 Ohm
Lead Channel Pacing Threshold Amplitude: 0.7 V
Lead Channel Pacing Threshold Pulse Width: 0.4 ms
Lead Channel Setting Pacing Amplitude: 2.5 V
Lead Channel Setting Pacing Pulse Width: 0.4 ms
Lead Channel Setting Sensing Sensitivity: 0.5 mV
Pulse Gen Serial Number: 310848
Zone Setting Status: 755011

## 2023-06-17 LAB — CBC WITH DIFFERENTIAL/PLATELET
Basophils Absolute: 0.1 10*3/uL (ref 0.0–0.2)
Basos: 1 %
EOS (ABSOLUTE): 0.1 10*3/uL (ref 0.0–0.4)
Eos: 1 %
Hematocrit: 48.1 % (ref 37.5–51.0)
Hemoglobin: 15.5 g/dL (ref 13.0–17.7)
Immature Grans (Abs): 0.1 10*3/uL (ref 0.0–0.1)
Immature Granulocytes: 1 %
Lymphocytes Absolute: 3.1 10*3/uL (ref 0.7–3.1)
Lymphs: 32 %
MCH: 30.8 pg (ref 26.6–33.0)
MCHC: 32.2 g/dL (ref 31.5–35.7)
MCV: 95 fL (ref 79–97)
Monocytes Absolute: 0.7 10*3/uL (ref 0.1–0.9)
Monocytes: 7 %
Neutrophils Absolute: 5.7 10*3/uL (ref 1.4–7.0)
Neutrophils: 58 %
Platelets: 206 10*3/uL (ref 150–450)
RBC: 5.04 x10E6/uL (ref 4.14–5.80)
RDW: 12.3 % (ref 11.6–15.4)
WBC: 9.8 10*3/uL (ref 3.4–10.8)

## 2023-06-17 LAB — VITAMIN B12: Vitamin B-12: 635 pg/mL (ref 232–1245)

## 2023-06-17 LAB — TSH RFX ON ABNORMAL TO FREE T4: TSH: 0.387 u[IU]/mL — ABNORMAL LOW (ref 0.450–4.500)

## 2023-06-17 LAB — T4F: T4,Free (Direct): 1.98 ng/dL — ABNORMAL HIGH (ref 0.82–1.77)

## 2023-06-17 LAB — RPR: RPR Ser Ql: NONREACTIVE

## 2023-06-18 ENCOUNTER — Telehealth: Payer: Self-pay | Admitting: Family Medicine

## 2023-06-18 NOTE — Telephone Encounter (Signed)
Called patient to discuss labs.  Told patient that his labs indicated mildly hyperthyroid.  Patient has not been on medication before.  Patient would like to see PCP and first appointment was on the fifth patient is already scheduled for this appointment will send PCP note about hyperthyroidism and can discuss management at that appointment.

## 2023-06-23 ENCOUNTER — Encounter: Payer: Self-pay | Admitting: Family Medicine

## 2023-06-26 ENCOUNTER — Other Ambulatory Visit: Payer: Self-pay | Admitting: Family Medicine

## 2023-06-26 DIAGNOSIS — M79641 Pain in right hand: Secondary | ICD-10-CM

## 2023-06-30 NOTE — Progress Notes (Signed)
Remote ICD transmission.   

## 2023-07-03 ENCOUNTER — Ambulatory Visit (HOSPITAL_COMMUNITY)
Admission: RE | Admit: 2023-07-03 | Discharge: 2023-07-03 | Disposition: A | Discharge status: Home / Self Care | Payer: BC Managed Care – PPO | Source: Ambulatory Visit | Attending: Family Medicine | Admitting: Family Medicine

## 2023-07-03 DIAGNOSIS — M79642 Pain in left hand: Secondary | ICD-10-CM | POA: Insufficient documentation

## 2023-07-03 DIAGNOSIS — M79641 Pain in right hand: Secondary | ICD-10-CM | POA: Insufficient documentation

## 2023-07-06 NOTE — Progress Notes (Signed)
SUBJECTIVE:   CHIEF COMPLAINT / HPI:   Andrew Huerta is a 57yo M w/ hx of HTN, CAD, CHF, GERD w/ barretts, HLD that p/w hand pain.  BL  Hand Pain - Reports getting carpal tunnel surgery but still having a lot of wrist pain - Now having swelling and stiffness in his fingers. Unable to make a closed fist.  - Was unable to see neurologist due to past bills.   Bruising - Reports having a spontaneous bruise on his L bicep. Denies any preceding trauma. Noticed this bruise 1-2 days ago. - Has hx of other small bruises that have appeared on his flank as well. - Denies nosebleed, gum bleeding, or blood in stool.  - Is taking plavix and ASA - Hasn't seen Cardiologist.   Med Issues - Has had difficulty affording his meds. Has also been struggly - Has been taking amiodarone, coreg, plavix, ASA, protonix, lisinopril, and Norco - Has been unable to take farxiga, lipitor, metformin, ertugliflozin, or zoloft.   Hyperthyroid - Reports sometimes feeling like his mind is foggy and has difficulty concentrating. - Feels sometimes that he is warmer than others.  - Reports occasionally feeling tremors  - Reports poor appetite. Sometimes just has a few pretzels for the whole day.   T2DM - Has not been on any of his T2DM meds (ertuglaflozin and metformin)  Need to f/u with Cardiologist  OBJECTIVE:   BP 122/80   Pulse 78   Wt 145 lb (65.8 kg)   SpO2 98%   BMI 22.71 kg/m   General: Alert, pleasant man. NAD. HEENT: NCAT. MMM. No neck mass or goiter. No neck tenderness to palpation. CV: RRR, no murmurs. Cap refill <2. Resp: CTAB, no wheezing or crackles. Normal WOB on RA.  Abm: Soft, nontender, nondistended. BS present. Ext: Moves all ext spontaneously. Mild swelling of PIP of both hands. Tenderness of BL radial wrists. Difficulty forming a fist with BL hands. Skin: Warm, well perfused. Bruising of L biceps. 2 smaller bruises noted on R side flank  ASSESSMENT/PLAN:   No problem-specific Assessment  & Plan notes found for this encounter.  Assessment & Plan Pain in both hands Chronic and uncontrolled. Likely multifactoral including diabetic neuropathy, potential sequela of carpal tunnel surgery, hyperthyroidism, and potential OA vs RA.  Unable to see Neurology at this time. Encouraged to f/u with Surgeon who performed his carpal tunnel surgery. Discussed gabapentin again, pt previously didn't like that it made him groggy feeling. Pt agreeable to trying gabapentin at night and low dose to see if it will help. - Gabapentin 100mg  nightly - Workup RA w/ anti-CCP, RF - f/u w/ surgery and neurology when able Thyroid dysfunction Potential symptoms including heat intolerance, difficulty concentrating, tremors, and could potentially be contributing to neuropathic hand pain. No goiter on exam. Will obtain further workup. Of note, pt is on amiodarone which could be contributing.  - Will obtain TSH, FT4, T3, TRAB - May need radioactive iodine uptake study in future, althought his may be difficult due to financial constraints. Bruising Pt reports recent bruising. Pt is on DAPT, which could be contributing. No signs of GI bleed or mucosal bleed. Will CTM for new signs of bleeding. Advised pt to f/u with Cardiology, may need to reassess need for DAPT Financial difficulty Unable to afford farxiga, lipitor, metformin, ertugliflozin, or zoloft. Will reach out to [facility] to see if there is anything we can do to help. Also encouraged pt to reach out to Cardiology to see  if they are able to help with his cards meds.    Lincoln Brigham, MD [facility]

## 2023-07-07 ENCOUNTER — Ambulatory Visit (INDEPENDENT_AMBULATORY_CARE_PROVIDER_SITE_OTHER): Payer: BC Managed Care – PPO | Admitting: Family Medicine

## 2023-07-07 ENCOUNTER — Encounter: Payer: Self-pay | Admitting: Family Medicine

## 2023-07-07 VITALS — BP 122/80 | HR 78 | Wt 145.0 lb

## 2023-07-07 DIAGNOSIS — T148XXA Other injury of unspecified body region, initial encounter: Secondary | ICD-10-CM

## 2023-07-07 DIAGNOSIS — E079 Disorder of thyroid, unspecified: Secondary | ICD-10-CM | POA: Diagnosis not present

## 2023-07-07 DIAGNOSIS — Z23 Encounter for immunization: Secondary | ICD-10-CM | POA: Diagnosis not present

## 2023-07-07 DIAGNOSIS — Z599 Problem related to housing and economic circumstances, unspecified: Secondary | ICD-10-CM

## 2023-07-07 DIAGNOSIS — G894 Chronic pain syndrome: Secondary | ICD-10-CM

## 2023-07-07 DIAGNOSIS — M79641 Pain in right hand: Secondary | ICD-10-CM

## 2023-07-07 DIAGNOSIS — M79642 Pain in left hand: Secondary | ICD-10-CM

## 2023-07-07 MED ORDER — HYDROCODONE-ACETAMINOPHEN 5-325 MG PO TABS: 1.0000 | ORAL_TABLET | Freq: Three times a day (TID) | ORAL | 0 refills | Status: DC | PRN

## 2023-07-07 MED ORDER — GABAPENTIN 100 MG PO CAPS
100.0000 mg | ORAL_CAPSULE | Freq: Every evening | ORAL | 1 refills | Status: DC
Start: 2023-07-07 — End: 2023-09-07

## 2023-07-07 NOTE — Patient Instructions (Signed)
Good to see you today - Thank you for coming in  Things we discussed today:  1) Make an appointment to see your Cardiologist within the next month. Please follow-up with them and they could potentially help with some medications you can't afford.   2) For your hand pain, let's check a few labs. - Start taking gabapentin 100mg  at night time. This should help with your pain and help with sleep.  3) We will check a few labs to check your thyroid level. If it is still hgih, then we may need to start medications to treat it.  Come back to see me in 1 month.

## 2023-07-09 ENCOUNTER — Telehealth: Payer: Self-pay

## 2023-07-09 NOTE — Telephone Encounter (Signed)
Patient calls nurse line with concerns for continued bruising.   He reports he saw PCP yesterday morning and showed him a decent size bruise on his upper arm. He reports he woke up this morning and noticed the bruise has traveled up and under his armpit. He reports he also has noticed 6 other bruises in various locations pop up throughout the day. He does report tenderness to the areas.   He denies any trauma to the areas. He reports he didn't work yesterday and stayed home.   He denies any bleeding from his nose, stool or mouth. No unusual SOB.  Patient reports having high anxiety surrounding symptoms.   Patient scheduled for tomorrow morning in [facility]. He reports he works in the afternoon.   However, if PCP does not think a visit is appropriate please let me know.   Will forward to PCP.

## 2023-07-10 ENCOUNTER — Encounter: Payer: Self-pay | Admitting: Family Medicine

## 2023-07-10 ENCOUNTER — Ambulatory Visit (INDEPENDENT_AMBULATORY_CARE_PROVIDER_SITE_OTHER): Payer: BC Managed Care – PPO | Admitting: Family Medicine

## 2023-07-10 ENCOUNTER — Telehealth: Payer: Self-pay | Admitting: Internal Medicine

## 2023-07-10 VITALS — BP 155/102 | HR 77 | Ht 67.0 in | Wt 146.0 lb

## 2023-07-10 DIAGNOSIS — R233 Spontaneous ecchymoses: Secondary | ICD-10-CM

## 2023-07-10 DIAGNOSIS — I10 Essential (primary) hypertension: Secondary | ICD-10-CM

## 2023-07-10 NOTE — Progress Notes (Signed)
    SUBJECTIVE:   CHIEF COMPLAINT / HPI:   Multiple bruises First noticed on his left arm 5 days ago.  No trauma.  Has been painful and spreading.  Also has noticed multiple smaller bruises on his right arm.  Has a history of similar bruises on his back and anterior hip that came on in 2020.  He is on Plavix and aspirin since 2020 given he has had CABG with 4 stents.  Has been feeling a little bit more dizzy.  No bleeding from any other site.  OBJECTIVE:   BP (!) 155/102   Pulse 77   Ht 5\' 7"  (1.702 m)   Wt 146 lb (66.2 kg)   SpO2 99%   BMI 22.87 kg/m   General: Alert and oriented, in NAD Skin: Warm, dry, and intact without lesions HEENT: NCAT, EOM grossly normal, midline nasal septum Cardiac: RRR, no m/r/g appreciated Respiratory: CTAB, breathing and speaking comfortably on RA Abdominal: Soft, nontender, nondistended, normoactive bowel sounds Extremities: Moves all extremities grossly equally Neurological: No gross focal deficit Psychiatric: Anxious mood       ASSESSMENT/PLAN:   Essential hypertension Elevated today, even on recheck, though was previously normal 11/5.  Consider anxiety playing a role.  Exam otherwise reassuring. Follow-up at next visit.  Bruising, spontaneous Persistent and worsening.  Reassured by lack of other sites of bleeding and normal vitals.  Consider aspirin and Plavix as culprit.  However, given worsening, will collect CBC with reflex to blood smear as well as PT/INR.  Depending on results, can also reach out to cardiologist to discuss further DAPT therapy.  Follow-up in 1 week.  Return precautions of worsening bruises, other sites of bleeding, or worsening dizziness discussed should they happen before follow-up.   Janeal Holmes, MD [facility]

## 2023-07-10 NOTE — Patient Instructions (Addendum)
We will get labs today. I will follow up results with you. If the bruising worsens, you start bleeding other places, or start feeling dizzy, please return to care. Come back to see how you are doing in 1 week.

## 2023-07-10 NOTE — Assessment & Plan Note (Signed)
Elevated today, even on recheck, though was previously normal 11/5.  Consider anxiety playing a role.  Exam otherwise reassuring. Follow-up at next visit.

## 2023-07-10 NOTE — Assessment & Plan Note (Signed)
Persistent and worsening.  Reassured by lack of other sites of bleeding and normal vitals.  Consider aspirin and Plavix as culprit.  However, given worsening, will collect CBC with reflex to blood smear as well as PT/INR.  Depending on results, can also reach out to cardiologist to discuss further DAPT therapy.  Follow-up in 1 week.  Return precautions of worsening bruises, other sites of bleeding, or worsening dizziness discussed should they happen before follow-up.

## 2023-07-10 NOTE — Telephone Encounter (Signed)
Patient contacted after hours line regarding a bruise he is now seeing develop at his armpit and arm area.  Patient had a device placed more than a year ago and reports the bruise did not start near his ICD.  He is now being evaluated for this bruise with his primary care provider.  Informed patient that he may need additional imaging to characterize bruise and potential source.

## 2023-07-11 ENCOUNTER — Other Ambulatory Visit: Payer: Self-pay | Admitting: Family Medicine

## 2023-07-11 DIAGNOSIS — N521 Erectile dysfunction due to diseases classified elsewhere: Secondary | ICD-10-CM

## 2023-07-11 LAB — CBC WITH DIFFERENTIAL/PLATELET
Basophils Absolute: 0.1 x10E3/uL (ref 0.0–0.2)
Basos: 1 %
EOS (ABSOLUTE): 0.1 x10E3/uL (ref 0.0–0.4)
Eos: 1 %
Hematocrit: 49.2 % (ref 37.5–51.0)
Hemoglobin: 16.3 g/dL (ref 13.0–17.7)
Immature Grans (Abs): 0.1 x10E3/uL (ref 0.0–0.1)
Immature Granulocytes: 1 %
Lymphocytes Absolute: 1.9 x10E3/uL (ref 0.7–3.1)
Lymphs: 19 %
MCH: 31 pg (ref 26.6–33.0)
MCHC: 33.1 g/dL (ref 31.5–35.7)
MCV: 94 fL (ref 79–97)
Monocytes Absolute: 0.5 x10E3/uL (ref 0.1–0.9)
Monocytes: 5 %
Neutrophils Absolute: 7.4 x10E3/uL — ABNORMAL HIGH (ref 1.4–7.0)
Neutrophils: 73 %
Platelets: 185 x10E3/uL (ref 150–450)
RBC: 5.25 x10E6/uL (ref 4.14–5.80)
RDW: 12.1 % (ref 11.6–15.4)
WBC: 10 x10E3/uL (ref 3.4–10.8)

## 2023-07-11 LAB — PROTIME-INR
INR: 1 (ref 0.9–1.2)
Prothrombin Time: 11.5 s (ref 9.1–12.0)

## 2023-07-13 ENCOUNTER — Telehealth: Payer: Self-pay | Admitting: Family Medicine

## 2023-07-13 ENCOUNTER — Encounter: Payer: Self-pay | Admitting: Family Medicine

## 2023-07-13 ENCOUNTER — Other Ambulatory Visit (HOSPITAL_COMMUNITY): Payer: Self-pay

## 2023-07-13 ENCOUNTER — Telehealth: Payer: Self-pay

## 2023-07-13 NOTE — Progress Notes (Unsigned)
    SUBJECTIVE:   CHIEF COMPLAINT / HPI:   ***  PT/INR wnl at prior visit  PERTINENT  PMH / PSH: ***  OBJECTIVE:   There were no vitals taken for this visit.  ***  ASSESSMENT/PLAN:   No problem-specific Assessment & Plan notes found for this encounter.     Lincoln Brigham, MD [facility]

## 2023-07-13 NOTE — Telephone Encounter (Signed)
Pharmacy Patient Advocate Encounter   Received notification from CoverMyMeds that prior authorization for Hydrocodone/acetaminophen is required/requested.   Insurance verification completed.   The patient is insured through [facility] .   Per test claim: PA required; PA submitted to above mentioned insurance via CoverMyMeds Key/confirmation #/EOC [id number]. Status is pending

## 2023-07-13 NOTE — Telephone Encounter (Signed)
Sent message to patient's cardiologist, Dr. Orson Aloe, concerning plavix and aspirin dosing with spontaneous bruising. Appreciate recommendations.

## 2023-07-14 ENCOUNTER — Ambulatory Visit: Payer: BC Managed Care – PPO | Admitting: Family Medicine

## 2023-07-14 VITALS — BP 160/101 | HR 79 | Ht 67.0 in | Wt 146.8 lb

## 2023-07-14 DIAGNOSIS — T148XXA Other injury of unspecified body region, initial encounter: Secondary | ICD-10-CM

## 2023-07-14 DIAGNOSIS — I10 Essential (primary) hypertension: Secondary | ICD-10-CM

## 2023-07-14 DIAGNOSIS — E1169 Type 2 diabetes mellitus with other specified complication: Secondary | ICD-10-CM | POA: Diagnosis not present

## 2023-07-14 DIAGNOSIS — F332 Major depressive disorder, recurrent severe without psychotic features: Secondary | ICD-10-CM | POA: Diagnosis not present

## 2023-07-14 MED ORDER — ATORVASTATIN CALCIUM 80 MG PO TABS: 80.0000 mg | ORAL_TABLET | Freq: Every day | ORAL | 3 refills | Status: AC

## 2023-07-14 MED ORDER — STEGLATRO 5 MG PO TABS: 5.0000 mg | ORAL_TABLET | Freq: Every day | ORAL | 6 refills | Status: DC

## 2023-07-14 MED ORDER — CARVEDILOL 3.125 MG PO TABS: 3.1250 mg | ORAL_TABLET | Freq: Two times a day (BID) | ORAL | 3 refills | Status: DC

## 2023-07-14 MED ORDER — LOSARTAN POTASSIUM 25 MG PO TABS: 25.0000 mg | ORAL_TABLET | Freq: Every day | ORAL | 3 refills | Status: DC

## 2023-07-14 MED ORDER — METFORMIN HCL ER 500 MG PO TB24: 1000.0000 mg | ORAL_TABLET | Freq: Every day | ORAL | 3 refills | Status: DC

## 2023-07-14 MED ORDER — SERTRALINE HCL 100 MG PO TABS: 200.0000 mg | ORAL_TABLET | Freq: Every day | ORAL | 3 refills | Status: DC

## 2023-07-14 NOTE — Progress Notes (Unsigned)
    SUBJECTIVE:   CHIEF COMPLAINT / HPI:   Andrew Huerta is a 57yo M w/ hx of CAD on DAPT, T2DM, depression that p/f bruising f/u.  Bruising - No new bruises since his last visit. Bumped into a wall and didn't bruise today.  - Has follow-up with Cardiologist on 11/21  Med Adherence - At last visit, reports that he has not been able to take lipitor, metformin, ertugliflozin, or zoloft due to financial stresses at that time. Willing to have refill sent and see if he can pick them up.  HTN - Reports that he is taking his coreg consistently  OBJECTIVE:   BP (!) 160/101   Pulse 79   Ht 5\' 7"  (1.702 m)   Wt 146 lb 12.8 oz (66.6 kg)   SpO2 98%   BMI 22.99 kg/m   General: Alert, pleasant man. NAD. HEENT: NCAT. MMM. CV: RRR, no murmurs.  Resp: CTAB, no wheezing or crackles. Normal WOB on RA.  Abm: Soft, nontender, nondistended. BS present. Ext: Moves all ext spontaneously Skin: Bruise on L bicep improving compared to prior. Prior scattered bruises on back are no longer present.   ASSESSMENT/PLAN:   Assessment & Plan Type 2 diabetes mellitus with other specified complication, unspecified whether long term insulin use (HCC) Currently not taking any meds. Will send refill for ertugliflozin and metformin. - Provided list of meds in order of priority so pt can fill what meds he is able to afford at this time Severe episode of recurrent major depressive disorder, without psychotic features (HCC) Currently off zoloft, will resend refill. Essential hypertension Elevated today and at prior visit. Reports adherence to coreg. Will add losartan given cardiac and T2DM comorbidities. Will also restart SGLT2. - Start losartan 25mg  daily - F/u in 1 month. BMP at that time Bruising Improving now, no new bruises. CBC and INR at prior visit normal. Suspect pt DAPT is contributing. Dr. Phineas Real has messaged his Cardiologist at his prior visit. Pt also has a f/u with Cardiologist on 11/21, so encouraged him to  mention the bruising side effects at that time too.   Patient Instructions  Good to see you today - Thank you for coming in  Things we discussed today:  1) I'm glad your bruising is improving. Please mention this to your Cardiologist and ask if you need to continue aspirin and plavix. They may say yes, which is ok.  2) I have sent refills of some of your meds that you weren't taking. I will rank them in order of importance just in case you can't pick up all of them:  1: lipitor  2: ertugliflozin  3: metformin  4: Zoloft  3) Your blood pressure was high today. I recommend starting Losartan 25mg  once a day.  - Check your blood pressure at home too and keep a log of what the numbers are. Bring those readings in to your next visit.  - Come back in 1 month to recheck your blood pressure  Come back to see me in 1 month    Andrew Brigham, MD [facility]

## 2023-07-14 NOTE — Patient Instructions (Signed)
Good to see you today - Thank you for coming in  Things we discussed today:  1) I'm glad your bruising is improving. Please mention this to your Cardiologist and ask if you need to continue aspirin and plavix. They may say yes, which is ok.  2) I have sent refills of some of your meds that you weren't taking. I will rank them in order of importance just in case you can't pick up all of them:  1: lipitor  2: ertugliflozin  3: metformin  4: Zoloft  3) Your blood pressure was high today. I recommend starting Losartan 25mg  once a day.  - Check your blood pressure at home too and keep a log of what the numbers are. Bring those readings in to your next visit.  - Come back in 1 month to recheck your blood pressure  Come back to see me in 1 month

## 2023-07-16 LAB — COMPREHENSIVE METABOLIC PANEL
ALT: 9 [IU]/L (ref 0–44)
AST: 12 [IU]/L (ref 0–40)
Albumin: 4.3 g/dL (ref 3.8–4.9)
Alkaline Phosphatase: 93 [IU]/L (ref 44–121)
BUN/Creatinine Ratio: 13 (ref 9–20)
BUN: 11 mg/dL (ref 6–24)
Bilirubin Total: 0.8 mg/dL (ref 0.0–1.2)
CO2: 22 mmol/L (ref 20–29)
Calcium: 9.4 mg/dL (ref 8.7–10.2)
Chloride: 102 mmol/L (ref 96–106)
Creatinine, Ser: 0.84 mg/dL (ref 0.76–1.27)
Globulin, Total: 2.5 g/dL (ref 1.5–4.5)
Glucose: 157 mg/dL — ABNORMAL HIGH (ref 70–99)
Potassium: 3.8 mmol/L (ref 3.5–5.2)
Sodium: 141 mmol/L (ref 134–144)
Total Protein: 6.8 g/dL (ref 6.0–8.5)
eGFR: 102 mL/min/{1.73_m2} (ref >59)

## 2023-07-16 LAB — T3, FREE: T3, Free: 3.1 pg/mL (ref 2.0–4.4)

## 2023-07-16 LAB — ANTI-CCP AB, IGG + IGA (RDL): Anti-CCP Ab, IgG + IgA (RDL): <20 U (ref <20)

## 2023-07-16 LAB — TSH+FREE T4
Free T4: 1.9 ng/dL — ABNORMAL HIGH (ref 0.82–1.77)
TSH: 0.522 u[IU]/mL (ref 0.450–4.500)

## 2023-07-16 LAB — THYROTROPIN RECEPTOR AUTOABS: Thyrotropin Receptor Ab: <1.1 [IU]/L (ref 0.00–1.75)

## 2023-07-16 LAB — RHEUMATOID FACTOR: Rheumatoid fact SerPl-aCnc: 11.6 [IU]/mL (ref <14.0)

## 2023-07-16 NOTE — Assessment & Plan Note (Signed)
Currently off zoloft, will resend refill.

## 2023-07-16 NOTE — Assessment & Plan Note (Signed)
Elevated today and at prior visit. Reports adherence to coreg. Will add losartan given cardiac and T2DM comorbidities. Will also restart SGLT2. - Start losartan 25mg  daily - F/u in 1 month. BMP at that time

## 2023-07-17 NOTE — Telephone Encounter (Signed)
Pharmacy Patient Advocate Encounter  Received notification from [facility] that Prior Authorization for HYDROCODONE-ACTEAMINOPHEN has been APPROVED from 07/13/23 to 01/10/24   PA #/Case ID/Reference #:  [id number]

## 2023-07-20 ENCOUNTER — Telehealth: Payer: Self-pay

## 2023-07-20 ENCOUNTER — Other Ambulatory Visit (HOSPITAL_COMMUNITY): Payer: Self-pay

## 2023-07-20 DIAGNOSIS — E1169 Type 2 diabetes mellitus with other specified complication: Secondary | ICD-10-CM

## 2023-07-20 NOTE — Telephone Encounter (Signed)
Patient calls nurse line for multiple concerns.   (1) He reports Steglatro is 90 dollars and he can not afford this.   (2) He reports his bruising has resolved, however now he has "lumps" all over his arm. He reports they are tender to touch. He denies any fevers or chills.   (3) He reports pain in his back and reports he believes is related to a fall years ago. He reports he would like imaging to make sure his rib cage has not "punctured anything." He denies any SOB.   Patient scheduled for tomorrow in [facility] for evaluation.   Will forward to Pharmacy Team for assistance with [facility]. He reports he believes he has used a coupon in the past.
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
## 2023-07-21 ENCOUNTER — Telehealth: Payer: Self-pay

## 2023-07-21 ENCOUNTER — Ambulatory Visit (INDEPENDENT_AMBULATORY_CARE_PROVIDER_SITE_OTHER): Payer: BC Managed Care – PPO | Admitting: Student

## 2023-07-21 VITALS — BP 136/70 | HR 70 | Ht 67.0 in | Wt 147.0 lb

## 2023-07-21 DIAGNOSIS — R109 Unspecified abdominal pain: Secondary | ICD-10-CM | POA: Diagnosis not present

## 2023-07-21 DIAGNOSIS — M79622 Pain in left upper arm: Secondary | ICD-10-CM | POA: Diagnosis not present

## 2023-07-21 NOTE — Progress Notes (Signed)
  SUBJECTIVE:   CHIEF COMPLAINT / HPI:   Bruise on left upper arm: remains from a bruise a couple weeks ago, now has knots in the arm but the bruise has resolved. Able to function arm completely. The knots are tender when pushed on. He can feel the knots when he bends his arm. No history of blood clots, he is on plavix and baby aspirin. Concern for rib fracture 1 week ago: about 2 weeks ago he heard a pop in his left flank when he extended his left arm reachign for a rail. He is unsure if he twisted anything but the area has been sore and difficult to lay on since then. He has been taking his prescribed Vicodin for this which has helped some. He has had rib fractures in the past. It sometimes hurts to breath fully in/out since this. Denies pain with cough. He has been using icy hot which helps minimally.   PERTINENT  PMH / PSH: HTN, CAD, HLD, GERD, CHF  OBJECTIVE:  BP 136/70   Pulse 70   Ht 5\' 7"  (1.702 m)   Wt 147 lb (66.7 kg)   SpO2 97%   BMI 23.02 kg/m  General: Well-appearing, NAD CV: RRR, murmurs auscultated Pulm: Normal work of breathing, CTAB MSK: Trace protrusion over posterior portion of left rib 4, normal range of motion of rib elevation and depression during breathing Derm: Approximately 5 mm subcutaneous nodularities without superficial dermatological changes along the inner biceps of left arm  ASSESSMENT/PLAN:   Assessment & Plan Left upper arm pain Potentially hematomas versus upper extremity DVT.  Will investigate with vascular ultrasound. Flank pain 2 weeks since injury, unlikely to have complication of serious rib fracture at this time.  Discussed that x-ray would not be beneficial as management would be unchanged even if her fracture was notable.  Should he still have some tenderness in a couple weeks, may consider further investigation or physical therapy. Return if symptoms worsen or fail to improve. Shelby Mattocks, DO 07/22/2023, 4:21 PM PGY-3, [facility]

## 2023-07-21 NOTE — Patient Instructions (Signed)
It was great to see you today! Thank you for choosing [facility] for your primary care.  Today we addressed: I ordered an ultrasound for your left upper extremity to explore for a DVT which is a blood clot in the venous system. For your rib pain, I would not recommend any x-ray as it would not change our management.  Continue to stay somewhat active and should this continue to last for another 2 weeks we can consider physical therapy. Prediabetes, pharmacy team is working on assisting you with this.  If you haven't already, sign up for My Chart to have easy access to your labs results, and communication with your primary care physician.  Return if symptoms worsen or fail to improve. Please arrive 15 minutes before your appointment to ensure smooth check in process.  We appreciate your efforts in making this happen.  Thank you for allowing me to participate in your care, Shelby Mattocks, DO 07/21/2023, 11:10 AM PGY-3, [facility]

## 2023-07-21 NOTE — Telephone Encounter (Signed)
Called patient and informed him of appointment for [place].  Patient has [facility] and is aware.  Glennie Hawk, CMA

## 2023-07-22 ENCOUNTER — Encounter (HOSPITAL_COMMUNITY): Payer: Self-pay

## 2023-07-22 ENCOUNTER — Ambulatory Visit (HOSPITAL_COMMUNITY): Admission: RE | Admit: 2023-07-22 | Payer: BC Managed Care – PPO | Source: Ambulatory Visit

## 2023-07-22 NOTE — Assessment & Plan Note (Signed)
2 weeks since injury, unlikely to have complication of serious rib fracture at this time.  Discussed that x-ray would not be beneficial as management would be unchanged even if her fracture was notable.  Should he still have some tenderness in a couple weeks, may consider further investigation or physical therapy.

## 2023-07-22 NOTE — Assessment & Plan Note (Signed)
Potentially hematomas versus upper extremity DVT.  Will investigate with vascular ultrasound.

## 2023-07-23 ENCOUNTER — Encounter: Payer: Self-pay | Admitting: Family Medicine

## 2023-07-23 ENCOUNTER — Encounter: Payer: Self-pay | Admitting: Cardiology

## 2023-07-23 ENCOUNTER — Ambulatory Visit: Payer: BC Managed Care – PPO | Attending: Cardiology | Admitting: Cardiology

## 2023-07-23 VITALS — BP 170/94 | HR 71 | Ht 67.0 in | Wt 145.0 lb

## 2023-07-23 DIAGNOSIS — I472 Ventricular tachycardia, unspecified: Secondary | ICD-10-CM

## 2023-07-23 DIAGNOSIS — I25118 Atherosclerotic heart disease of native coronary artery with other forms of angina pectoris: Secondary | ICD-10-CM

## 2023-07-23 DIAGNOSIS — I10 Essential (primary) hypertension: Secondary | ICD-10-CM

## 2023-07-23 DIAGNOSIS — Z4502 Encounter for adjustment and management of automatic implantable cardiac defibrillator: Secondary | ICD-10-CM

## 2023-07-23 DIAGNOSIS — I255 Ischemic cardiomyopathy: Secondary | ICD-10-CM

## 2023-07-23 DIAGNOSIS — I5022 Chronic systolic (congestive) heart failure: Secondary | ICD-10-CM

## 2023-07-23 DIAGNOSIS — E7800 Pure hypercholesterolemia, unspecified: Secondary | ICD-10-CM

## 2023-07-23 MED ORDER — CARVEDILOL 6.25 MG PO TABS: 6.2500 mg | ORAL_TABLET | Freq: Two times a day (BID) | ORAL | 3 refills | Status: DC

## 2023-07-23 MED ORDER — EMPAGLIFLOZIN 10 MG PO TABS: 10.0000 mg | ORAL_TABLET | Freq: Every day | ORAL | 1 refills | Status: AC

## 2023-07-23 NOTE — Patient Instructions (Signed)
Medication Instructions:  Increase Carvedilol to 6.25 mg twice a day Continue all other medications *If you need a refill on your cardiac medications before your next appointment, please call your pharmacy*   Lab Work: Lipid panel today   Testing/Procedures: None ordered   Follow-Up: At [facility], you and your health needs are our priority.  As part of our continuing mission to provide you with exceptional heart care, we have created designated Provider Care Teams.  These Care Teams include your primary Cardiologist (physician) and Advanced Practice Providers (APPs -  Physician Assistants and Nurse Practitioners) who all work together to provide you with the care you need, when you need it.  We recommend signing up for the patient portal called "MyChart".  Sign up information is provided on this After Visit Summary.  MyChart is used to connect with patients for Virtual Visits (Telemedicine).  Patients are able to view lab/test results, encounter notes, upcoming appointments, etc.  Non-urgent messages can be sent to your provider as well.   To learn more about what you can do with MyChart, go to [email or web address].    Your next appointment:  4 months    Provider:  Joni Reining DNP

## 2023-07-24 LAB — LIPID PANEL
Chol/HDL Ratio: 3.5 ratio (ref 0.0–5.0)
Cholesterol, Total: 216 mg/dL — ABNORMAL HIGH (ref 100–199)
HDL: 62 mg/dL (ref >39)
LDL Chol Calc (NIH): 136 mg/dL — ABNORMAL HIGH (ref 0–99)
Triglycerides: 102 mg/dL (ref 0–149)
VLDL Cholesterol Cal: 18 mg/dL (ref 5–40)

## 2023-07-27 ENCOUNTER — Encounter: Payer: Self-pay | Admitting: Student

## 2023-07-27 ENCOUNTER — Telehealth: Payer: Self-pay

## 2023-07-27 NOTE — Telephone Encounter (Signed)
Called [facility] to schedule a Vas [place] for patient. Wednesday, November 27th at [facility] patient to inform him about his upcoming [place] appointment at [facility]. Tried to give patient the address and telephone number just in case the appointment made did not fit his schedule.  Patient declined the address and telephone number and stated that he was at work and the only reason why he picked up his cell phone was because he knew the number.  Drusilla Kanner, CMA

## 2023-07-28 ENCOUNTER — Other Ambulatory Visit: Payer: Self-pay

## 2023-07-28 DIAGNOSIS — E7800 Pure hypercholesterolemia, unspecified: Secondary | ICD-10-CM

## 2023-07-28 MED ORDER — EZETIMIBE 10 MG PO TABS: 10.0000 mg | ORAL_TABLET | Freq: Every day | ORAL | 3 refills | Status: DC

## 2023-08-04 ENCOUNTER — Other Ambulatory Visit: Payer: Self-pay | Admitting: Family Medicine

## 2023-08-04 DIAGNOSIS — G894 Chronic pain syndrome: Secondary | ICD-10-CM

## 2023-08-06 ENCOUNTER — Encounter: Payer: Self-pay | Admitting: Family Medicine

## 2023-08-06 ENCOUNTER — Ambulatory Visit: Payer: BC Managed Care – PPO | Admitting: Family Medicine

## 2023-08-06 VITALS — BP 144/93 | HR 79 | Ht 67.0 in | Wt 145.2 lb

## 2023-08-06 DIAGNOSIS — R400 Somnolence: Secondary | ICD-10-CM

## 2023-08-06 DIAGNOSIS — E1169 Type 2 diabetes mellitus with other specified complication: Secondary | ICD-10-CM | POA: Diagnosis not present

## 2023-08-06 DIAGNOSIS — I10 Essential (primary) hypertension: Secondary | ICD-10-CM | POA: Diagnosis not present

## 2023-08-06 MED ORDER — LOSARTAN POTASSIUM 50 MG PO TABS: 50.0000 mg | ORAL_TABLET | Freq: Every day | ORAL | 2 refills | Status: DC

## 2023-08-06 NOTE — Assessment & Plan Note (Signed)
Uncontrolled.  Started on losartan 25 at last visit, tolerating well.  Denies orthostatic symptoms. -Increase losartan to 50 mg daily -BMP today -Follow-up in 2 to 4 weeks

## 2023-08-06 NOTE — Patient Instructions (Addendum)
Good to see you today - Thank you for coming in  Things we discussed today:  1) For your FMLA forms, make sure you get those faxed or dropped off at our clinic ASAP. I will try to complete it and send it in to your work.   2) For your drowsiness, zoloft and gabapentin can make you sleepy. Take those meds at night time.  3) I will work on [employer] for you or a cheaper alternative medication.   4) Your blood pressure is still high. I am going to increase your losartan from 25 to 50mg . I have sent in 50mg  tablets. You can take 2 of your 25mg  tablets until you run out. We will check your labs today.   Come back in 2-4 weeks to recheck your blood pressure.

## 2023-08-06 NOTE — Progress Notes (Signed)
    SUBJECTIVE:   CHIEF COMPLAINT / HPI:   Andrew Huerta is a 57yo M w/ hx of CAD on DAPT, T2DM, depression that presents for diabetes and hypertension follow-up.   Needs FMLA - He works at [employer] at [employer], but does not currently have a fixed schedule.  This makes it difficult for him to schedule doctors appointments ahead of time. - He is at risk of losing his job due to needing to go to doctors appointments.  Reports that he was given a warning by his job for having to get his ultrasound previously.  HTN - taking losartan - last took last night.  Gabapentin - reports he was taking gabapentin made him drowsy when he took it during the daytime  Meds - back on lipitor, metformin, zoloft - Cardiologist sent in for Zetia, but he was unable to pick this up due to price - Also is not on jardiance. This cost him $155. Stelgatro was also around $90.  OBJECTIVE:   BP (!) 144/96   Pulse 79   Ht 5\' 7"  (1.702 m)   Wt 145 lb 3.2 oz (65.9 kg)   SpO2 100%   BMI 22.74 kg/m   General: Alert, pleasant man. NAD. HEENT: NCAT. MMM. CV: RRR, no murmurs. Resp: CTAB, no wheezing or crackles. Normal WOB on RA.  Abm: Soft, nontender, nondistended. BS present. Ext: Moves all ext spontaneously Skin: Warm, well perfused   ASSESSMENT/PLAN:   Assessment & Plan Essential hypertension Uncontrolled.  Started on losartan 25 at last visit, tolerating well.  Denies orthostatic symptoms. -Increase losartan to 50 mg daily -BMP today -Follow-up in 2 to 4 weeks Drowsiness Advised patient that Zoloft and gabapentin can both cause drowsiness.  Advised patient to take these medications at nighttime prior to bedtime.  If patient is still unable to tolerate gabapentin due to drowsiness, can discontinue. Type 2 diabetes mellitus with other specified complication, unspecified whether long term insulin use (HCC) Patient reports that he is having difficulty having Jardiance covered by insurance.  Patient would highly  benefit from an SGLT2 for further diabetes management. -Will message [facility] about [facility] prior Auth   Lincoln Brigham, MD [facility]

## 2023-08-07 ENCOUNTER — Other Ambulatory Visit: Payer: Self-pay | Admitting: Family Medicine

## 2023-08-07 ENCOUNTER — Telehealth: Payer: Self-pay

## 2023-08-07 ENCOUNTER — Other Ambulatory Visit (HOSPITAL_COMMUNITY): Payer: Self-pay

## 2023-08-07 DIAGNOSIS — G894 Chronic pain syndrome: Secondary | ICD-10-CM

## 2023-08-07 NOTE — Telephone Encounter (Signed)
-----   Message from Lincoln Brigham sent at 08/06/2023  5:32 PM EST ----- Regarding: Jardiance or other SGLT2 Hi Andrew Huerta,  I am reaching out about this pt. We have previously tried to get him on an SGLT2 include stelgatro and jardiance. He was showing me that the meds were still costing over $100. His other meds are being covered. He has T2DM. Can we do a prior auth or do we need to find alternative SGLT2?

## 2023-08-07 NOTE — Telephone Encounter (Signed)
Patients copay at [facility] is $155.91.  Was able to enroll patient for a Jardiance copay card.   Called card info to [facility], medication now $10.   Pt aware.

## 2023-08-10 MED ORDER — HYDROCODONE-ACETAMINOPHEN 5-325 MG PO TABS: 1.0000 | ORAL_TABLET | Freq: Three times a day (TID) | ORAL | 0 refills | Status: DC | PRN

## 2023-08-14 ENCOUNTER — Other Ambulatory Visit: Payer: Self-pay | Admitting: Cardiology

## 2023-08-14 DIAGNOSIS — Z79899 Other long term (current) drug therapy: Secondary | ICD-10-CM

## 2023-08-19 ENCOUNTER — Emergency Department (HOSPITAL_BASED_OUTPATIENT_CLINIC_OR_DEPARTMENT_OTHER): Payer: BC Managed Care – PPO

## 2023-08-19 ENCOUNTER — Encounter (HOSPITAL_BASED_OUTPATIENT_CLINIC_OR_DEPARTMENT_OTHER): Payer: Self-pay | Admitting: Emergency Medicine

## 2023-08-19 ENCOUNTER — Other Ambulatory Visit: Payer: Self-pay

## 2023-08-19 ENCOUNTER — Telehealth: Payer: Self-pay | Admitting: Cardiology

## 2023-08-19 ENCOUNTER — Emergency Department (HOSPITAL_BASED_OUTPATIENT_CLINIC_OR_DEPARTMENT_OTHER): Payer: BC Managed Care – PPO | Admitting: Radiology

## 2023-08-19 ENCOUNTER — Emergency Department (HOSPITAL_BASED_OUTPATIENT_CLINIC_OR_DEPARTMENT_OTHER)
Admission: EM | Admit: 2023-08-19 | Discharge: 2023-08-19 | Disposition: A | Discharge status: Home / Self Care | Payer: BC Managed Care – PPO | Attending: Emergency Medicine | Admitting: Emergency Medicine

## 2023-08-19 DIAGNOSIS — I10 Essential (primary) hypertension: Secondary | ICD-10-CM | POA: Insufficient documentation

## 2023-08-19 DIAGNOSIS — Z7984 Long term (current) use of oral hypoglycemic drugs: Secondary | ICD-10-CM | POA: Diagnosis not present

## 2023-08-19 DIAGNOSIS — I2510 Atherosclerotic heart disease of native coronary artery without angina pectoris: Secondary | ICD-10-CM | POA: Diagnosis not present

## 2023-08-19 DIAGNOSIS — Z7982 Long term (current) use of aspirin: Secondary | ICD-10-CM | POA: Diagnosis not present

## 2023-08-19 DIAGNOSIS — Z7902 Long term (current) use of antithrombotics/antiplatelets: Secondary | ICD-10-CM | POA: Insufficient documentation

## 2023-08-19 DIAGNOSIS — Z79899 Other long term (current) drug therapy: Secondary | ICD-10-CM | POA: Diagnosis not present

## 2023-08-19 DIAGNOSIS — R0609 Other forms of dyspnea: Secondary | ICD-10-CM | POA: Insufficient documentation

## 2023-08-19 DIAGNOSIS — R072 Precordial pain: Secondary | ICD-10-CM | POA: Insufficient documentation

## 2023-08-19 DIAGNOSIS — R0602 Shortness of breath: Secondary | ICD-10-CM | POA: Diagnosis not present

## 2023-08-19 DIAGNOSIS — E1165 Type 2 diabetes mellitus with hyperglycemia: Secondary | ICD-10-CM | POA: Diagnosis not present

## 2023-08-19 DIAGNOSIS — R739 Hyperglycemia, unspecified: Secondary | ICD-10-CM

## 2023-08-19 DIAGNOSIS — R079 Chest pain, unspecified: Secondary | ICD-10-CM | POA: Diagnosis present

## 2023-08-19 LAB — BASIC METABOLIC PANEL
Anion gap: 7 (ref 5–15)
BUN: 12 mg/dL (ref 6–20)
CO2: 29 mmol/L (ref 22–32)
Calcium: 9.2 mg/dL (ref 8.9–10.3)
Chloride: 102 mmol/L (ref 98–111)
Creatinine, Ser: 0.93 mg/dL (ref 0.61–1.24)
GFR, Estimated: >60 mL/min (ref >60)
Glucose, Bld: 386 mg/dL — ABNORMAL HIGH (ref 70–99)
Potassium: 4 mmol/L (ref 3.5–5.1)
Sodium: 138 mmol/L (ref 135–145)

## 2023-08-19 LAB — CBC
HCT: 43.3 % (ref 39.0–52.0)
Hemoglobin: 14.9 g/dL (ref 13.0–17.0)
MCH: 31.1 pg (ref 26.0–34.0)
MCHC: 34.4 g/dL (ref 30.0–36.0)
MCV: 90.4 fL (ref 80.0–100.0)
Platelets: 192 10*3/uL (ref 150–400)
RBC: 4.79 MIL/uL (ref 4.22–5.81)
RDW: 13.5 % (ref 11.5–15.5)
WBC: 10.5 10*3/uL (ref 4.0–10.5)
nRBC: 0 % (ref 0.0–0.2)

## 2023-08-19 LAB — TROPONIN I (HIGH SENSITIVITY)
Troponin I (High Sensitivity): 7 ng/L (ref <18)
Troponin I (High Sensitivity): 7 ng/L

## 2023-08-19 MED ORDER — IOHEXOL 350 MG/ML SOLN
100.0000 mL | Freq: Once | INTRAVENOUS | Status: AC | PRN
  Administered 2023-08-19: 75 mL via INTRAVENOUS

## 2023-08-19 NOTE — Discharge Instructions (Addendum)
The right adenoma was a little increased in size now 3 cm.  Make sure your doctors are aware of that.  Workup here for the chest pain and shortness of breath without any acute findings.  But if you get any new or worse chest pain or chest pain lasting longer need to get seen again.  Make an appointment to follow-up with cardiology.  Also blood sugar was a little elevated here.  Make sure you follow your blood sugars carefully.

## 2023-08-19 NOTE — ED Provider Notes (Addendum)
[place] EMERGENCY DEPARTMENT AT [facility] Provider Note   CSN: [id number] Arrival date & time: 08/19/23  1907     History  Chief Complaint  Patient presents with   Chest Pain    Andrew Huerta is a 57 y.o. male.  Patient with significant coronary artery disease.  Followed by Dr. Peter Swaziland from cardiology.  Last saw heart care on November 21.  Patient has a history of coronary artery disease PCI to LAD and left Cx on 2015 and PCI to RCA in 2020 then PCI to LAD in April 2023.  Also has diabetes known to have Barrett's esophagus or adrenal tumor status post adrenal gland resection at [facility] hypertension hyperlipidemia primary hyperaldosteronism and status post adrenalectomy patient also had sustained V. tach so received ICD.  Patient has some chronic left anterior chest pain that is baseline for him.  But today with walking he got sharp intermittent pain left anterior chest associated with shortness of breath but improved some with rest.  Also with a complaint of some left calf pain.  But no leg swelling.       Home Medications Prior to Admission medications   Medication Sig Start Date End Date Taking? Authorizing Provider  amiodarone (PACERONE) 200 MG tablet Take 1 tablet by mouth once daily 08/14/23   Lanier Prude, MD  aspirin EC 81 MG tablet Take 81 mg by mouth daily.    [provider]  atorvastatin (LIPITOR) 80 MG tablet Take 1 tablet (80 mg total) by mouth daily. 07/14/23   Lincoln Brigham, MD  Capsaicin-Menthol-Methyl Sal (CAPSAICIN-METHYL SAL-MENTHOL) 0.025-1-12 % CREA Apply 1 Application topically as needed. 06/16/23   Lockie Mola, MD  carvedilol (COREG) 6.25 MG tablet Take 1 tablet (6.25 mg total) by mouth 2 (two) times daily. 07/23/23   Swaziland, Peter M, MD  clopidogrel (PLAVIX) 75 MG tablet Take 1 tablet (75 mg total) by mouth daily. 07/07/22   Bensimhon, Bevelyn Buckles, MD  empagliflozin (JARDIANCE) 10 MG TABS tablet Take 1 tablet (10 mg total) by  mouth daily before breakfast. 07/23/23   Lincoln Brigham, MD  ezetimibe (ZETIA) 10 MG tablet Take 1 tablet (10 mg total) by mouth daily. 07/28/23 10/26/23  [place]azil[place], Peter M, MD  gabapentin (NEURONTIN) 100 MG capsule Take 1 capsule (100 mg total) by mouth at bedtime. 07/07/23   Lincoln Brigham, MD  glipiZIDE (GLUCOTROL) 5 MG tablet Take 1 tablet (5 mg total) by mouth daily. 02/11/22   Jackelyn Poling, DO  HYDROcodone-acetaminophen (NORCO/VICODIN) 5-325 MG tablet Take 1-2 tablets by mouth every 8 (eight) hours as needed for moderate pain (pain score 4-6). 08/10/23   Lincoln Brigham, MD  losartan (COZAAR) 50 MG tablet Take 1 tablet (50 mg total) by mouth at bedtime. 08/06/23   Lincoln Brigham, MD  metFORMIN (GLUCOPHAGE-XR) 500 MG 24 hr tablet Take 2 tablets (1,000 mg total) by mouth daily. 07/14/23   Lincoln Brigham, MD  nitroGLYCERIN (NITROSTAT) 0.4 MG SL tablet Place 1 tablet (0.4 mg total) under the tongue every 5 (five) minutes as needed. 12/26/21   Arty Baumgartner, NP  pantoprazole (PROTONIX) 40 MG tablet Take 1 tablet (40 mg total) by mouth daily. 04/03/22   Lincoln Brigham, MD  sertraline (ZOLOFT) 100 MG tablet Take 2 tablets (200 mg total) by mouth daily. 07/14/23   Lincoln Brigham, MD  sildenafil (VIAGRA) 50 MG tablet TAKE 1 TABLET BY MOUTH DAILY AS NEEDED FOR ERECTILE DYSFUNCTION 07/13/23   Lincoln Brigham, MD  Allergies    Ozempic (0.25 or 0.5 mg-dose) [semaglutide(0.25 or 0.5mg -dos)], Cortisone acetate, Darvocet [propoxyphene n-acetaminophen], Nsaids, Spironolactone, Xanax [alprazolam], and Tape    Review of Systems   Review of Systems  Constitutional:  Negative for chills and fever.  HENT:  Negative for ear pain and sore throat.   Eyes:  Negative for pain and visual disturbance.  Respiratory:  Positive for shortness of breath. Negative for cough.   Cardiovascular:  Positive for chest pain. Negative for palpitations and leg swelling.  Gastrointestinal:  Negative for abdominal pain and vomiting.  Genitourinary:   Negative for dysuria and hematuria.  Musculoskeletal:  Negative for arthralgias and back pain.  Skin:  Negative for color change and rash.  Neurological:  Negative for seizures and syncope.  All other systems reviewed and are negative.   Physical Exam Updated Vital Signs BP (!) 153/93   Pulse 75   Temp 98 F (36.7 C)   Resp 12   Ht 1.702 m (5\' 7" )   Wt 65.9 kg   SpO2 97%   BMI 22.75 kg/m  Physical Exam Vitals and nursing note reviewed.  Constitutional:      General: He is not in acute distress.    Appearance: Normal appearance. He is well-developed. He is not ill-appearing.  HENT:     Head: Normocephalic and atraumatic.     Mouth/Throat:     Mouth: Mucous membranes are moist.  Eyes:     Extraocular Movements: Extraocular movements intact.     Conjunctiva/sclera: Conjunctivae normal.     Pupils: Pupils are equal, round, and reactive to light.  Cardiovascular:     Rate and Rhythm: Normal rate and regular rhythm.     Heart sounds: No murmur heard. Pulmonary:     Effort: Pulmonary effort is normal. No respiratory distress.     Breath sounds: Normal breath sounds. No wheezing, rhonchi or rales.     Comments: ICD left anterior chest. Abdominal:     Palpations: Abdomen is soft.     Tenderness: There is no abdominal tenderness.  Musculoskeletal:        General: No swelling.     Cervical back: Neck supple.     Right lower leg: No edema.     Left lower leg: No edema.  Skin:    General: Skin is warm and dry.     Capillary Refill: Capillary refill takes less than 2 seconds.  Neurological:     General: No focal deficit present.     Mental Status: He is alert and oriented to person, place, and time.  Psychiatric:        Mood and Affect: Mood normal.     ED Results / Procedures / Treatments   Labs (all labs ordered are listed, but only abnormal results are displayed) Labs Reviewed  BASIC METABOLIC PANEL - Abnormal; Notable for the following components:      Result  Value   Glucose, Bld 386 (*)    All other components within normal limits  CBC  TROPONIN I (HIGH SENSITIVITY)  TROPONIN I (HIGH SENSITIVITY)    EKG None  Radiology CT Angio Chest PE W/Cm &/Or Wo Cm Result Date: 08/19/2023 CLINICAL DATA:  Left-sided chest pain EXAM: CT ANGIOGRAPHY CHEST WITH CONTRAST TECHNIQUE: Multidetector CT imaging of the chest was performed using the standard protocol during bolus administration of intravenous contrast. Multiplanar CT image reconstructions and MIPs were obtained to evaluate the vascular anatomy. RADIATION DOSE REDUCTION: This exam was performed according to the  departmental dose-optimization program which includes automated exposure control, adjustment of the mA and/or kV according to patient size and/or use of iterative reconstruction technique. CONTRAST:  75mL OMNIPAQUE IOHEXOL 350 MG/ML SOLN COMPARISON:  Chest x-ray 08/19/2023, CT chest 05/13/2016 FINDINGS: Cardiovascular: Satisfactory opacification of the pulmonary arteries to the segmental level. No evidence of pulmonary embolism. Normal heart size. No pericardial effusion. Nonaneurysmal aorta. Mild atherosclerosis. Coronary vascular calcification. Left-sided pacing device. Mediastinum/Nodes: Patent trachea. No thyroid mass. No suspicious lymph nodes. Esophagus within normal limits. Lungs/Pleura: Lungs are clear. No pleural effusion or pneumothorax. Upper Abdomen: Gallstone. Right adrenal mass now measuring 3 cm, previously 2.3 cm. This was previously characterized as adenoma. Musculoskeletal: No acute osseous abnormality. Review of the MIP images confirms the above findings. IMPRESSION: 1. Negative for acute pulmonary embolus or aortic dissection. Clear lung fields. 2. Gallstone. 3. Slight interval increase in size of right adrenal adenoma now measuring 3 cm 4. Aortic atherosclerosis. Aortic Atherosclerosis (ICD10-I70.0). Electronically Signed   By: Jasmine Pang M.D.   On: 08/19/2023 21:53   US Venous Img  Lower  Left (DVT Study) Result Date: 08/19/2023 CLINICAL DATA:  Shortness of breath, left lower extremity pain. EXAM: LEFT LOWER EXTREMITY VENOUS DOPPLER ULTRASOUND TECHNIQUE: Gray-scale sonography with compression, as well as color and duplex ultrasound, were performed to evaluate the deep venous system(s) from the level of the common femoral vein through the popliteal and proximal calf veins. COMPARISON:  None Available. FINDINGS: VENOUS Normal compressibility of the common femoral, superficial femoral, and popliteal veins, as well as the visualized calf veins. Visualized portions of profunda femoral vein and great saphenous vein unremarkable. No filling defects to suggest DVT on grayscale or color Doppler imaging. Doppler waveforms show normal direction of venous flow, normal respiratory plasticity and response to augmentation. Limited views of the contralateral common femoral vein are unremarkable. OTHER None. Limitations: none IMPRESSION: Negative. Electronically Signed   By: Thornell Sartorius M.D.   On: 08/19/2023 21:41   DG Chest 2 View Result Date: 08/19/2023 CLINICAL DATA:  Chest pain EXAM: CHEST - 2 VIEW COMPARISON:  12/17/2021 FINDINGS: Left-sided pacing device. Normal cardiac size. No acute airspace disease, pleural effusion or pneumothorax. IMPRESSION: No active cardiopulmonary disease. Electronically Signed   By: Jasmine Pang M.D.   On: 08/19/2023 21:40    Procedures Procedures    Medications Ordered in ED Medications  iohexol (OMNIPAQUE) 350 MG/ML injection 100 mL (75 mLs Intravenous Contrast Given 08/19/23 2050)    ED Course/ Medical Decision Making/ A&P                                 Medical Decision Making Amount and/or Complexity of Data Reviewed Labs: ordered. Radiology: ordered.  Risk Prescription drug management.   Patient with significant coronary artery disease.  EKG here without any significant change from baseline.  Basic metabolic panel normal renal function  is normal.  CBC normal no leukocytosis hemoglobin 14.9 platelets 192 initial troponin very reassuring at 78 delta troponin that is pending.  Also discussed Doppler studies of the left leg because of the calf tenderness.  Chest x-ray was ordered results are pending but I reviewed the chest x-ray myself nothing acute on that.  Therefore we will going get CT angio of chest to rule out PE.  Doppler study of left lower extremity no evidence of DVT.  CT angio chest without evidence of any pulmonary embolus.  Lungs were clear.  There  is a slight interval increase in the size of the right adrenal adenoma now measuring 3 cm.  Patient will be evaluated for that.  Chest x-ray also had no acute findings.  Initial troponin was 7.  Will need delta troponins.  If negative patient probably stable for discharge home.  Patient's blood sugar elevated here but no signs of any acidosis.  Patient made aware that it is elevated in 300 range.  Patient's delta troponin is also 7.  Initial troponin 7.  So no bump in that.  Not really concerned about unstable angina or acute cardiac event.  However patient knows that if chest pain becomes more severe will need to be seen again.  Also recommend he follow back up with his cardiology group.   Final Clinical Impression(s) / ED Diagnoses Final diagnoses:  Dyspnea on exertion  Precordial pain  Hyperglycemia    Rx / DC Orders ED Discharge Orders     None         Vanetta Mulders, MD 08/19/23 2038    Vanetta Mulders, MD 08/19/23 2956    Vanetta Mulders, MD 08/19/23 2130    Vanetta Mulders, MD 08/19/23 2313

## 2023-08-19 NOTE — Telephone Encounter (Signed)
Patient called answering service this evening concerned that he was having some chest pain, left leg pain, fatigue, shortness of breath.  Reports that earlier today, he went for a walk and developed chest pain.  Chest pain worsened, and he vomited.  After resting for a bit, his symptoms resolved.  He tried to go to work, but again had recurrence of chest pain.  He also felt very fatigued and like he had to rest.  He went home to rest, and developed left leg pain.  Currently, reports that his symptoms feel better but have not completely resolved.  Discussed that is very difficult for me to evaluate him over the phone.  However, with him having exertional chest pain, shortness of breath, and left leg pain, I do think he needs to be seen this evening.  Recommended that he go to the ER for evaluation.  Patient voiced understanding and agreement, plans to go to the [facility] emergency department tonight

## 2023-08-19 NOTE — ED Triage Notes (Signed)
Chest pain left side started when walking  Around 11 AM  Worse while working.  Nausea fatigue sob  Stent and ICD in 2023

## 2023-08-21 ENCOUNTER — Telehealth: Payer: Self-pay | Admitting: Family Medicine

## 2023-08-21 NOTE — Telephone Encounter (Signed)
Patient dropped off FMLA paperwork to be completed. Last DOS was 08/06/23. Placed in [employer].

## 2023-08-24 NOTE — Telephone Encounter (Signed)
Placed in MDs box to be filled out. Mylan Schwarz, CMA  

## 2023-08-27 ENCOUNTER — Encounter: Payer: Self-pay | Admitting: Family Medicine

## 2023-08-27 ENCOUNTER — Ambulatory Visit (INDEPENDENT_AMBULATORY_CARE_PROVIDER_SITE_OTHER): Payer: BC Managed Care – PPO | Admitting: Family Medicine

## 2023-08-27 VITALS — BP 155/95 | HR 82 | Ht 67.0 in | Wt 142.1 lb

## 2023-08-27 DIAGNOSIS — I10 Essential (primary) hypertension: Secondary | ICD-10-CM | POA: Diagnosis not present

## 2023-08-27 DIAGNOSIS — D3501 Benign neoplasm of right adrenal gland: Secondary | ICD-10-CM | POA: Diagnosis not present

## 2023-08-27 MED ORDER — HYDROCHLOROTHIAZIDE 12.5 MG PO TABS: 12.5000 mg | ORAL_TABLET | Freq: Every day | ORAL | 1 refills | Status: DC

## 2023-08-27 NOTE — Patient Instructions (Signed)
Good to see you today - Thank you for coming in  Things we discussed today:  1) Your adrenal adenoma is now 3cm (it was 2.5cm in 2020). I recommend following up with your prior Surgical Oncologist at [facility]. Please give him a call to schedule an appointment, I will also send a referral just in case.   [facility]  [address]  [address]  [place], [place] [address]  [phone]  Billy Fischer, MD  [address]  [place], [place] [address]  [phone] (Work)  [phone] (Fax)    This is also the contact info for the endocrinologist you saw at that time:   [facility]  [address]  [address]  [place], [place] [address]  [phone]  Izell Red Bud, MD  [address]  [address]  [place], [place] [address]  [phone] (Work)  [phone] (Fax)     2) In the meantime, I will start hydrochlorothiazide 12.5mg  once a day to help control your blood pressure.   Come back to see me in 2-3 weeks to recheck your labs and see where we are with working up your adrenal mass.

## 2023-08-27 NOTE — Progress Notes (Signed)
    SUBJECTIVE:   CHIEF COMPLAINT / HPI:    Andrew Huerta is a 57yo M w/ hx of L adrenalectomy for aldosterinoma. HTN. CAD. T2DM that p/f ED f/u.  - Seen in ED 12/18 for chest pain, trops and CTA PE negative at that time.  - Incidentally noted to have interval enlargement of adrenal adenoma   - Has also had recent increase in blood pressure - Also been having headaches, fatigue, and emotional lability.  - Has been taking hydrochlorothiazide, that was started at prior visit - Reports that last time he had adrenal mass, he had elevated BP for unexplained resons as well.  - been feeling more nauseous.   Per chart review, pt had L adrenalectomy for aldosterinoma in 10/2013 at [facility]. He was following with [facility].    [facility]  [address]  [address]  [place], [place] [address]  [phone]  Izell New Galilee, MD  [address]  [address]  [place], [place] [address]  [phone] (Work)  [phone] [facility])    [facility]  [address]  [address]  [place], [place] [address]  [phone]  Billy Fischer, MD  [address]  [place], [place] [address]  [phone] (Work)  [phone] (Fax)      OBJECTIVE:   BP (!) 155/95   Pulse 82   Ht 5\' 7"  (1.702 m)   Wt 142 lb 2 oz (64.5 kg)   SpO2 99%   BMI 22.26 kg/m   General: Alert, pleasant man. NAD. HEENT: NCAT. MMM. CV: RRR, no murmurs.  Resp: CTAB, no wheezing or crackles. Normal WOB on RA.  Abm: Soft, nontender, nondistended. BS present. Ext: Moves all ext spontaneously Skin: Warm, well perfused   ASSESSMENT/PLAN:   Assessment & Plan Essential hypertension Remains uncontrolled.  Increase losartan at prior visit.  Will add HCTZ 12.5 daily.  Follow-up in 2 to 3 weeks to reassess blood pressure and check BMP. Adenoma of right adrenal gland Interval enlargement of R adrenal adenoma from 2.5cm 2020 to 3cm now.  Warrants further evaluation of benign vs active/malignant. Has hx of L aldosterinoma. Has also had recent worsening of HTN (as above). Weight stable. Electrolytes and CBC wnl 12/18.  - Advised to f/u with [facility] surgeon Dr. Lavonna Rua who previously managed his L adrenal adenoma. Referral placed.   Can get 40hour a month for FMLA to attend doctors visits.   Lincoln Brigham, MD [facility]

## 2023-08-28 NOTE — Assessment & Plan Note (Signed)
Remains uncontrolled.  Increase losartan at prior visit.  Will add HCTZ 12.5 daily.  Follow-up in 2 to 3 weeks to reassess blood pressure and check BMP.

## 2023-08-28 NOTE — Assessment & Plan Note (Signed)
Interval enlargement of R adrenal adenoma from 2.5cm 2020 to 3cm now. Warrants further evaluation of benign vs active/malignant. Has hx of L aldosterinoma. Has also had recent worsening of HTN (as above). Weight stable. Electrolytes and CBC wnl 12/18.  - Advised to f/u with [facility] surgeon Dr. Lavonna Rua who previously managed his L adrenal adenoma. Referral placed.

## 2023-09-03 ENCOUNTER — Other Ambulatory Visit: Payer: Self-pay | Admitting: Family Medicine

## 2023-09-03 DIAGNOSIS — G894 Chronic pain syndrome: Secondary | ICD-10-CM

## 2023-09-03 NOTE — Telephone Encounter (Signed)
 Delay in documentation.   Faxed paperwork to provided number on Monday evening. Copy made and placed in batch scanning.   Patient called and informed that paperwork ws complete. Original placed at front desk for pick up.   Veronda Prude, RN

## 2023-09-05 ENCOUNTER — Other Ambulatory Visit: Payer: Self-pay | Admitting: Family Medicine

## 2023-09-05 DIAGNOSIS — M79641 Pain in right hand: Secondary | ICD-10-CM

## 2023-09-07 MED ORDER — HYDROCODONE-ACETAMINOPHEN 5-325 MG PO TABS: 1.0000 | ORAL_TABLET | Freq: Three times a day (TID) | ORAL | 0 refills | Status: DC | PRN

## 2023-09-14 ENCOUNTER — Ambulatory Visit (INDEPENDENT_AMBULATORY_CARE_PROVIDER_SITE_OTHER): Payer: BC Managed Care – PPO

## 2023-09-14 DIAGNOSIS — I472 Ventricular tachycardia, unspecified: Secondary | ICD-10-CM

## 2023-09-14 DIAGNOSIS — I255 Ischemic cardiomyopathy: Secondary | ICD-10-CM

## 2023-09-14 LAB — CUP PACEART REMOTE DEVICE CHECK
Battery Remaining Longevity: 162 mo
Battery Remaining Percentage: 100 %
Brady Statistic RV Percent Paced: 0 %
Date Time Interrogation Session: 20250113042100
HighPow Impedance: 69 Ohm
Implantable Lead Connection Status: 753985
Implantable Lead Implant Date: 20230414
Implantable Lead Location: 753860
Implantable Lead Model: 672
Implantable Lead Serial Number: 182871
Implantable Pulse Generator Implant Date: 20230414
Lead Channel Impedance Value: 728 Ohm
Lead Channel Pacing Threshold Amplitude: 0.8 V
Lead Channel Pacing Threshold Pulse Width: 0.4 ms
Lead Channel Setting Pacing Amplitude: 2.5 V
Lead Channel Setting Pacing Pulse Width: 0.4 ms
Lead Channel Setting Sensing Sensitivity: 0.5 mV
Pulse Gen Serial Number: 310848
Zone Setting Status: 755011

## 2023-09-15 ENCOUNTER — Ambulatory Visit (INDEPENDENT_AMBULATORY_CARE_PROVIDER_SITE_OTHER): Payer: BC Managed Care – PPO | Admitting: Family Medicine

## 2023-09-15 ENCOUNTER — Encounter: Payer: Self-pay | Admitting: Family Medicine

## 2023-09-15 VITALS — BP 139/92 | HR 71 | Ht 67.0 in | Wt 148.5 lb

## 2023-09-15 DIAGNOSIS — T148XXA Other injury of unspecified body region, initial encounter: Secondary | ICD-10-CM

## 2023-09-15 DIAGNOSIS — I10 Essential (primary) hypertension: Secondary | ICD-10-CM

## 2023-09-15 MED ORDER — HYDROCHLOROTHIAZIDE 25 MG PO TABS: 25.0000 mg | ORAL_TABLET | Freq: Every day | ORAL | 3 refills | Status: AC

## 2023-09-15 NOTE — Progress Notes (Signed)
    SUBJECTIVE:   CHIEF COMPLAINT / HPI:    Andrew Huerta is a 58yo M w/ hx of HTN, R adrenal adenoma, CAD on DAPT indefinitely that p/f BP f/u  HTN - Started on hydrochlorothiazide  last time -Tolerating well. -Prefers to wait to get labs drawn.  Patient plans to get labs drawn tomorrow at his [facility] visit  R Adrenal Adenoma -Is going to see [facility] tomorrow  Bruising - has new bruising on Right flank, it's going away - Also has one on L bicep, it is also going away - These bruises are popping up and going away.  Patient also reports feeling a bump under the bruising when they pop up.  OBJECTIVE:   BP (!) 139/92   Pulse 71   Ht 5' 7 (1.702 m)   Wt 148 lb 8 oz (67.4 kg)   SpO2 100%   BMI 23.26 kg/m   General: Alert, pleasant man. NAD. HEENT: NCAT. MMM. CV: RRR, no murmurs.  Resp: CTAB, no wheezing or crackles. Normal WOB on RA.  Abm: Soft, nontender, nondistended. BS present. Ext: Moves all ext spontaneously Skin: Bruising on R flank and L bicep, appears to be resolving/  ASSESSMENT/PLAN:   Assessment & Plan Primary hypertension Restarted hydrochlorothiazide  at last visit.  Systolic blood pressure improving, however diastolic still above goal.  Will increase hydrochlorothiazide  to 25 mg daily -Continue losartan  -Patient would like to wait until follow-up to check BMP.  Advised patient to come back sooner if he notices his blood pressures consistently above 140/90 Bruising Continues to have mild bruising, this is improving now. Has been seen for past few months for this;  Suspect that this is secondary to his DAPT therapy.  No signs of blood in stool or mucosal bleeding.  Discussed return precautions including blood in stool, mucosal bleeding, uncontrolled bruising.    Andrew Nearing, MD [facility]

## 2023-09-15 NOTE — Patient Instructions (Signed)
 Good to see you today - Thank you for coming in  Things we discussed today:  1) Your blood pressure is improving but still high.  The goal is to get your blood pressure less than 140/90. -I will increase your hydrochlorothiazide  to 25 mg a day.  You can take 2 tablets of your 12.5 mg until you run out.  I have sent the refill for 25 mg to your pharmacy -Continue taking your losartan  -Follow-up in about 4 to 6 months.  If you notice that your blood pressure is consistently higher than 140/90, then please come in sooner.  2) lets keep an eye on your bruising for now, I am reassured that they are improving.  Please see me if your bruising becomes uncontrolled, you start to notice blood in your stool, you start to notice bleeding from your gums.

## 2023-09-18 ENCOUNTER — Ambulatory Visit: Payer: BC Managed Care – PPO | Admitting: Student

## 2023-10-01 ENCOUNTER — Encounter: Payer: Self-pay | Admitting: Cardiology

## 2023-10-06 ENCOUNTER — Other Ambulatory Visit: Payer: Self-pay | Admitting: Family Medicine

## 2023-10-06 DIAGNOSIS — G894 Chronic pain syndrome: Secondary | ICD-10-CM

## 2023-10-06 MED ORDER — HYDROCODONE-ACETAMINOPHEN 5-325 MG PO TABS: 1.0000 | ORAL_TABLET | Freq: Three times a day (TID) | ORAL | 0 refills | Status: DC | PRN

## 2023-10-08 ENCOUNTER — Other Ambulatory Visit (HOSPITAL_COMMUNITY): Payer: Self-pay

## 2023-10-09 ENCOUNTER — Telehealth: Payer: Self-pay

## 2023-10-09 NOTE — Telephone Encounter (Signed)
 Pharmacy Patient Advocate Encounter   Received notification from CoverMyMeds that prior authorization for HYDROcodone -Acetaminophen  5-325MG  is required/requested.   Insurance verification completed.   The patient is insured through [facility] .   Per test claim: PA required; PA submitted to above mentioned insurance via CoverMyMeds Key/confirmation #/EOC [facility]. Status is pending

## 2023-10-13 NOTE — Telephone Encounter (Signed)
This medication or product was previously approved on [id number] from 2023-07-13 to 2024-01-10.

## 2023-10-15 ENCOUNTER — Other Ambulatory Visit: Payer: Self-pay | Admitting: Cardiology

## 2023-10-15 DIAGNOSIS — Z79899 Other long term (current) drug therapy: Secondary | ICD-10-CM

## 2023-10-23 ENCOUNTER — Other Ambulatory Visit: Payer: Self-pay | Admitting: Family Medicine

## 2023-10-23 DIAGNOSIS — K219 Gastro-esophageal reflux disease without esophagitis: Secondary | ICD-10-CM

## 2023-10-26 MED ORDER — PANTOPRAZOLE SODIUM 40 MG PO TBEC
40.0000 mg | DELAYED_RELEASE_TABLET | Freq: Every day | ORAL | 3 refills | Status: DC
  Filled 2023-10-26: qty 90, 90d supply, fill #0
  Filled 2024-03-27: qty 90, 90d supply, fill #1

## 2023-10-27 ENCOUNTER — Other Ambulatory Visit (HOSPITAL_COMMUNITY): Payer: Self-pay

## 2023-10-27 NOTE — Progress Notes (Signed)
 Remote ICD transmission.

## 2023-11-03 ENCOUNTER — Other Ambulatory Visit: Payer: Self-pay | Admitting: Family Medicine

## 2023-11-03 DIAGNOSIS — I10 Essential (primary) hypertension: Secondary | ICD-10-CM

## 2023-11-03 DIAGNOSIS — G894 Chronic pain syndrome: Secondary | ICD-10-CM

## 2023-11-05 ENCOUNTER — Ambulatory Visit: Payer: BC Managed Care – PPO | Admitting: Adult Health

## 2023-11-05 ENCOUNTER — Other Ambulatory Visit: Payer: Self-pay | Admitting: Family Medicine

## 2023-11-05 ENCOUNTER — Ambulatory Visit: Payer: BC Managed Care – PPO | Attending: Nurse Practitioner | Admitting: Nurse Practitioner

## 2023-11-05 ENCOUNTER — Encounter: Payer: Self-pay | Admitting: Nurse Practitioner

## 2023-11-05 VITALS — BP 110/84 | HR 86 | Ht 67.0 in | Wt 142.0 lb

## 2023-11-05 DIAGNOSIS — I2510 Atherosclerotic heart disease of native coronary artery without angina pectoris: Secondary | ICD-10-CM | POA: Diagnosis not present

## 2023-11-05 DIAGNOSIS — I5022 Chronic systolic (congestive) heart failure: Secondary | ICD-10-CM

## 2023-11-05 DIAGNOSIS — G4733 Obstructive sleep apnea (adult) (pediatric): Secondary | ICD-10-CM

## 2023-11-05 DIAGNOSIS — I255 Ischemic cardiomyopathy: Secondary | ICD-10-CM | POA: Diagnosis not present

## 2023-11-05 DIAGNOSIS — I10 Essential (primary) hypertension: Secondary | ICD-10-CM

## 2023-11-05 DIAGNOSIS — Z95810 Presence of automatic (implantable) cardiac defibrillator: Secondary | ICD-10-CM

## 2023-11-05 DIAGNOSIS — G894 Chronic pain syndrome: Secondary | ICD-10-CM

## 2023-11-05 DIAGNOSIS — I472 Ventricular tachycardia, unspecified: Secondary | ICD-10-CM

## 2023-11-05 DIAGNOSIS — E785 Hyperlipidemia, unspecified: Secondary | ICD-10-CM

## 2023-11-05 MED ORDER — EZETIMIBE 10 MG PO TABS: 10.0000 mg | ORAL_TABLET | Freq: Every day | ORAL | 3 refills | Status: DC

## 2023-11-05 MED ORDER — RANOLAZINE ER 500 MG PO TB12: 500.0000 mg | ORAL_TABLET | Freq: Two times a day (BID) | ORAL | 3 refills | Status: DC

## 2023-11-05 NOTE — Progress Notes (Signed)
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 08/19/2023   CL 102 08/19/2023   CO2 29 08/19/2023   Lab Results  Component Value Date   ALT 9 07/07/2023   AST 12 07/07/2023   ALKPHOS 93 07/07/2023   BILITOT 0.8 07/07/2023   Lab Results  Component Value Date   CHOL 216 (H) 07/23/2023   HDL 62 07/23/2023   LDLCALC 136 (H) 07/23/2023   LDLDIRECT 169 (H) 07/08/2011   TRIG 102 07/23/2023   CHOLHDL 3.5 07/23/2023    Lab Results  Component Value Date   HGBA1C 7.4 (A) 06/16/2023    Assessment & Plan    1. CAD: S/p PCI-LAD/LCx in 2015, PCI-RCA in 2020, PCI-LAD in 2023. Intolerant to Imdur.  Previously unable to afford Ranexa.  He now has different insurance. Will trial Ranexa 500 mg twice daily.  Continue to monitor symptoms.  If symptoms do not improve with Ranexa, or if he  is unable to tolerate Ranexa, consider amlodipine. Reviewed ED precautions. Will discuss with Dr. [place], possible need for any ischemic evaluation however, it appears his symptoms are stable. Continue aspirin, Plavix, carvedilol, hydrochlorothiazide, losartan, Jardiance, Lipitor, and Zetia as below.  2. ICM/chronic systolic heart failure: Prior EF 20 to 25%. Repeat echocardiogram in 03/2022 showed EF improved to 40 to 45%, mildly decreased LV function, normal RV systolic function, no significant valvular abnormalities.Euvolemic and well compensated on exam.  Continue current medications as above.  3. History of VT: S/p ICD.  Most recent device check in 09/2023 showed normal device function.  Following with EP.  4. Hypertension: BP well-controlled in office today though he notes intermittently elevated BP at home.  However, he states he does not check his blood pressure regularly.  Encouraged him to monitor BP and report because the greater than 130/80.  He does note frequent headaches.  If headaches persist in the setting of normal BP, would recommend follow-up with PCP.  5.  Hyperlipidemia: LDL was 136 in 07/2023.  It was recommended he start taking Zetia, however, he never started taking this medication.  Will have him start Zetia 10 mg daily.  Will plan for repeat fasting lipids, LFTs at next follow-up.  6. Disposition: Follow-up in 2 months, sooner if needed.      Joylene Grapes, NP 11/05/2023, 12:55 PM

## 2023-11-05 NOTE — Patient Instructions (Addendum)
 Medication Instructions:  Ranexa 500 mg twice daily Restart Zetia 10 mg daily  *If you need a refill on your cardiac medications before your next appointment, please call your pharmacy*   Lab Work: NONE ordered at this time of appointment  Testing/Procedures: NONE ordered at this time of appointment   Follow-Up: At [facility], you and your health needs are our priority.  As part of our continuing mission to provide you with exceptional heart care, we have created designated Provider Care Teams.  These Care Teams include your primary Cardiologist (physician) and Advanced Practice Providers (APPs -  Physician Assistants and Nurse Practitioners) who all work together to provide you with the care you need, when you need it.  We recommend signing up for the patient portal called "MyChart".  Sign up information is provided on this After Visit Summary.  MyChart is used to connect with patients for Virtual Visits (Telemedicine).  Patients are able to view lab/test results, encounter notes, upcoming appointments, etc.  Non-urgent messages can be sent to your provider as well.   To learn more about what you can do with MyChart, go to [email or web address].    Your next appointment:   2 month(s) Follow up with EP Provider:   Peter [place], MD or any APP    Other Instructions Monitor blood pressure. Take BP 2 hours after taking BP mediation and seated rest for 5-10 mins.   See your Primary Care Physician if headaches continue.  Please come to your next appointment fasting

## 2023-11-06 MED ORDER — HYDROCODONE-ACETAMINOPHEN 5-325 MG PO TABS: 1.0000 | ORAL_TABLET | Freq: Three times a day (TID) | ORAL | 0 refills | Status: DC | PRN

## 2023-11-06 NOTE — Telephone Encounter (Signed)
 Patient calls nurse line regarding refill that was requested on 11/03/23.   Paged provider at [phone] regarding request.   Veronda Prude, RN

## 2023-12-04 ENCOUNTER — Other Ambulatory Visit: Payer: Self-pay | Admitting: Family Medicine

## 2023-12-04 DIAGNOSIS — G894 Chronic pain syndrome: Secondary | ICD-10-CM

## 2023-12-04 MED ORDER — HYDROCODONE-ACETAMINOPHEN 5-325 MG PO TABS: 1.0000 | ORAL_TABLET | Freq: Three times a day (TID) | ORAL | 0 refills | Status: DC | PRN

## 2023-12-14 ENCOUNTER — Ambulatory Visit (INDEPENDENT_AMBULATORY_CARE_PROVIDER_SITE_OTHER): Payer: BC Managed Care – PPO

## 2023-12-14 DIAGNOSIS — I472 Ventricular tachycardia, unspecified: Secondary | ICD-10-CM

## 2023-12-14 DIAGNOSIS — I255 Ischemic cardiomyopathy: Secondary | ICD-10-CM

## 2023-12-14 LAB — CUP PACEART REMOTE DEVICE CHECK
Battery Remaining Longevity: 162 mo
Battery Remaining Percentage: 100 %
Brady Statistic RV Percent Paced: 0 %
Date Time Interrogation Session: 20250414042200
HighPow Impedance: 79 Ohm
Implantable Lead Connection Status: 753985
Implantable Lead Implant Date: 20230414
Implantable Lead Location: 753860
Implantable Lead Model: 672
Implantable Lead Serial Number: 182871
Implantable Pulse Generator Implant Date: 20230414
Lead Channel Impedance Value: 738 Ohm
Lead Channel Pacing Threshold Amplitude: 0.6 V
Lead Channel Pacing Threshold Pulse Width: 0.4 ms
Lead Channel Setting Pacing Amplitude: 2.5 V
Lead Channel Setting Pacing Pulse Width: 0.4 ms
Lead Channel Setting Sensing Sensitivity: 0.5 mV
Pulse Gen Serial Number: 310848
Zone Setting Status: 755011

## 2023-12-15 ENCOUNTER — Encounter: Payer: Self-pay | Admitting: Cardiology

## 2024-01-03 ENCOUNTER — Other Ambulatory Visit: Payer: Self-pay | Admitting: Family Medicine

## 2024-01-03 DIAGNOSIS — G894 Chronic pain syndrome: Secondary | ICD-10-CM

## 2024-01-05 MED ORDER — HYDROCODONE-ACETAMINOPHEN 5-325 MG PO TABS: 1.0000 | ORAL_TABLET | Freq: Three times a day (TID) | ORAL | 0 refills | Status: DC | PRN

## 2024-01-09 ENCOUNTER — Other Ambulatory Visit: Payer: Self-pay | Admitting: Cardiology

## 2024-01-09 DIAGNOSIS — Z79899 Other long term (current) drug therapy: Secondary | ICD-10-CM
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
## 2024-01-21 ENCOUNTER — Ambulatory Visit: Payer: Self-pay | Admitting: Cardiology

## 2024-02-02 NOTE — Progress Notes (Signed)
 Remote ICD transmission.

## 2024-02-03 ENCOUNTER — Other Ambulatory Visit: Payer: Self-pay

## 2024-02-03 DIAGNOSIS — G894 Chronic pain syndrome: Secondary | ICD-10-CM

## 2024-02-03 MED ORDER — HYDROCODONE-ACETAMINOPHEN 5-325 MG PO TABS
1.0000 | ORAL_TABLET | Freq: Three times a day (TID) | ORAL | 0 refills | Status: DC | PRN
Start: 2024-02-03 — End: 2024-03-03

## 2024-02-03 NOTE — Telephone Encounter (Signed)
 PDMP reviewed and appropriate.  Last refilled hydrocodone -acetaminophen  5-325 mg on 01/05/2024.  Needs follow-up appointment PCP given last seen 09/2023 with multiple chronic conditions and care gaps.

## 2024-02-12 ENCOUNTER — Ambulatory Visit (INDEPENDENT_AMBULATORY_CARE_PROVIDER_SITE_OTHER): Admitting: Family Medicine

## 2024-02-12 ENCOUNTER — Encounter: Payer: Self-pay | Admitting: Family Medicine

## 2024-02-12 VITALS — BP 136/82 | HR 77 | Wt 134.0 lb

## 2024-02-12 DIAGNOSIS — R634 Abnormal weight loss: Secondary | ICD-10-CM | POA: Diagnosis not present

## 2024-02-12 DIAGNOSIS — Z658 Other specified problems related to psychosocial circumstances: Secondary | ICD-10-CM | POA: Diagnosis not present

## 2024-02-12 DIAGNOSIS — F332 Major depressive disorder, recurrent severe without psychotic features: Secondary | ICD-10-CM | POA: Diagnosis not present

## 2024-02-12 LAB — POCT SEDIMENTATION RATE: POCT SED RATE: 2 mm/h (ref 0–22)

## 2024-02-12 NOTE — Patient Instructions (Addendum)
 Good to see you today - Thank you for coming in  Things we discussed today:  1) We will check a few labs today to check on your why you are losing weight  2) Expect a phone call from our social worker over the next few days to help with insurance and financial issues. They will also connect you with therapy resources.   3) Expect a phone call from GI to schedule an appointment. They will need to check on your barrets and do a colonoscopy.   Please always bring your medication bottles  Come back to see me in  1 month

## 2024-02-12 NOTE — Progress Notes (Unsigned)
    SUBJECTIVE:   CHIEF COMPLAINT / HPI:   Andrew Huerta is a 58 yo M w/ hx of HTN, CAD, zchf, barretts esophagus, T2DM, MDD that pf unintentional weight loss.  - Over past 3 months, reports having decreased appetite. He eats half a hamburger and will get full for the whole day - Denies N/V - Pt loss his job at [employer]. - Denies night sweats - Reports having no energy. Reports sleeping 12 hrs - Has previously had barretts, but unable to get scoped in the past due to his heart condition at the time. - No new bruising - Sister and dad passed away stomach cancer. - Has had passive suicidality. Does not have plan now and has protective factors against suicide. He wants to stay alive for his kids and grandkids - He recently lost his job and is worried about his finances.   OBJECTIVE:   BP 136/82   Pulse 77   Wt 134 lb (60.8 kg)   SpO2 99%   BMI 20.99 kg/m   General: Alert, tearful man. NAD. Psych: Tearful. Denies active SI.  HEENT: NCAT. MMM. CV: RRR, no murmurs.  Resp: CTAB, no wheezing or crackles. Normal WOB on RA.  Abm: Tender to palpation in epigastric region. Soft, nondistended. Ext: Moves all ext spontaneously Skin: Warm, well perfused   ASSESSMENT/PLAN:   Assessment & Plan Weight loss, unintentional ~8lb weight loss over past 3 months, with decreased appetite, early satiety, and fatigue. Differential includes malignancy (particularly GI), MDD, or malabsorptive disorder. Given hx of barretts and age, will refer to GI for further screening(EGD, colonoscopy). Will also workup anemia, hypothyroidism, HIV, HepC. - referral to GI - CBC, CMP, HIV, HepC, TSH, LDH, ESR - f/u in 1 month Severe episode of recurrent major depressive disorder, without psychotic features (HCC) On zoloft . Passive SI, but no active SI. A big source of stress is losing his job, having difficulty affording medical care, and losing his insurance.  - Referral to [facility] to help with mental health resources and  financial assistance. Hopefully can help him access financial assistance to get colonoscopy and EGD w/ GI - Cont zoloft    Albin Huh, MD [facility]

## 2024-02-14 NOTE — Assessment & Plan Note (Addendum)
 On zoloft . Passive SI, but no active SI. A big source of stress is losing his job, having difficulty affording medical care, and losing his insurance.  - Referral to [facility] to help with mental health resources and financial assistance. Hopefully can help him access financial assistance to get colonoscopy and EGD w/ GI - Cont zoloft 

## 2024-02-15 ENCOUNTER — Telehealth: Payer: Self-pay | Admitting: *Deleted

## 2024-02-15 NOTE — Progress Notes (Signed)
 Complex Care Management Note  Care Guide Note 02/15/2024 Name: Andrew Huerta MRN: [id number] DOB: [birth year]  [name] is a 58 y.o. year old male who sees Albin Huh, MD for primary care. I reached out to Victor Grapes by phone today to offer complex care management services.  [name] was given information about Complex Care Management services today including:   The Complex Care Management services include support from the care team which includes your Nurse Care Manager, Clinical Social Worker, or Pharmacist.  The Complex Care Management team is here to help remove barriers to the health concerns and goals most important to you. Complex Care Management services are voluntary, and the patient may decline or stop services at any time by request to their care team member.   Complex Care Management Consent Status: Patient agreed to services and verbal consent obtained.   Follow up plan:  Telephone appointment with complex care management team member scheduled for:  02/19/24  Encounter Outcome:  Patient Scheduled  Barnie Bora  [facility]  [facility] Guide  Direct Dial: [phone]  Fax [phone]

## 2024-02-17 LAB — CMP14+EGFR

## 2024-02-18 LAB — HIV ANTIBODY (ROUTINE TESTING W REFLEX)

## 2024-02-18 LAB — CMP14+EGFR

## 2024-02-18 LAB — HEPATITIS C ANTIBODY

## 2024-02-18 LAB — CBC

## 2024-02-18 LAB — LACTATE DEHYDROGENASE

## 2024-02-19 ENCOUNTER — Other Ambulatory Visit: Payer: Self-pay

## 2024-02-19 NOTE — Patient Instructions (Signed)
 Visit Information  Thank you for taking time to visit with me today. Please don't hesitate to contact me if I can be of assistance to you before our next scheduled appointment.  Our next appointment is by telephone on 02/24/2024 at 9:30AM.  Please call the care guide team at [phone] if you need to cancel or reschedule your appointment.   Following is a copy of your care plan:   Goals Addressed             This Visit's Progress    BSW [facility] Social Work Care Plan   On track    Problems:   Corporate treasurer , Geophysicist/field seismologist , Housing , and no health insurance.  CSW Clinical Goal(s):   Over the next 2 weeks the Patient will work with Child psychotherapist to address concerns related to food insecurity, financial strain, housing, and no health insurance.  Interventions:  SW will provide patient food resources for food insecurity need. SW will also assist patient in completing [facility] Financial Assistance application to help cover colonoscopy.   Patient Goals/Self-Care Activities:  Visit local DSS office to apply for Food Stamps and [place] Medicaid  Plan:   Telephone follow up appointment with care management team member scheduled for:  02/24/2024 at 9:30AM [facility] Financial Assistance Application).        Please call the Suicide and Crisis Lifeline: 988 go to [facility] Urgent [facility] [address], [place] [phone]) call 911 if you are experiencing a Mental Health or Behavioral Health Crisis or need someone to talk to.  Patient verbalizes understanding of instructions and care plan provided today and agrees to view in MyChart. Active MyChart status and patient understanding of how to access instructions and care plan via MyChart confirmed with patient.     Burt Casco, BSW [place]/[facility]I - [employer] Social Worker [phone]
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
## 2024-02-22 ENCOUNTER — Other Ambulatory Visit: Payer: Self-pay

## 2024-02-22 DIAGNOSIS — R634 Abnormal weight loss: Secondary | ICD-10-CM

## 2024-02-23 LAB — CBC
Hematocrit: 52.5 % — ABNORMAL HIGH (ref 37.5–51.0)
Hemoglobin: 17 g/dL (ref 13.0–17.7)
MCH: 31.8 pg (ref 26.6–33.0)
MCHC: 32.4 g/dL (ref 31.5–35.7)
MCV: 98 fL — ABNORMAL HIGH (ref 79–97)
Platelets: 187 10*3/uL (ref 150–450)
RBC: 5.35 x10E6/uL (ref 4.14–5.80)
RDW: 12 % (ref 11.6–15.4)
WBC: 6.9 10*3/uL (ref 3.4–10.8)

## 2024-02-23 LAB — COMPREHENSIVE METABOLIC PANEL WITH GFR
ALT: 9 IU/L (ref 0–44)
AST: 12 IU/L (ref 0–40)
Albumin: 4.2 g/dL (ref 3.8–4.9)
Alkaline Phosphatase: 92 IU/L (ref 44–121)
BUN/Creatinine Ratio: 11 (ref 9–20)
BUN: 10 mg/dL (ref 6–24)
Bilirubin Total: 0.4 mg/dL (ref 0.0–1.2)
CO2: 23 mmol/L (ref 20–29)
Calcium: 9 mg/dL (ref 8.7–10.2)
Chloride: 104 mmol/L (ref 96–106)
Creatinine, Ser: 0.94 mg/dL (ref 0.76–1.27)
Globulin, Total: 2.2 g/dL (ref 1.5–4.5)
Glucose: 110 mg/dL — ABNORMAL HIGH (ref 70–99)
Potassium: 3.9 mmol/L (ref 3.5–5.2)
Sodium: 144 mmol/L (ref 134–144)
Total Protein: 6.4 g/dL (ref 6.0–8.5)
eGFR: 95 mL/min/{1.73_m2} (ref >59)

## 2024-02-23 LAB — LACTATE DEHYDROGENASE: LDH: 129 IU/L (ref 121–224)

## 2024-02-23 LAB — HIV ANTIBODY (ROUTINE TESTING W REFLEX): HIV Screen 4th Generation wRfx: NONREACTIVE

## 2024-02-23 LAB — HEPATITIS C ANTIBODY: Hep C Virus Ab: NONREACTIVE

## 2024-02-24 ENCOUNTER — Other Ambulatory Visit: Payer: Self-pay

## 2024-02-24 NOTE — Patient Instructions (Signed)
 Visit Information  Thank you for taking time to visit with me today. Please don't hesitate to contact me if I can be of assistance to you before our next scheduled appointment.  Your next care management appointment is by telephone on 03/09/2024 at 10:15AM  Please call the care guide team at [phone] if you need to cancel, schedule, or reschedule an appointment.   Please call the Suicide and Crisis Lifeline: 988 call the [facility]: [phone] call 911 if you are experiencing a Mental Health or Behavioral Health Crisis or need someone to talk to.  Laymon Doll, BSW [place]/VBCI - [employer] Social Worker [phone]
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
## 2024-02-29 ENCOUNTER — Ambulatory Visit: Payer: Self-pay | Admitting: Family Medicine

## 2024-02-29 ENCOUNTER — Encounter: Payer: Self-pay | Admitting: Family Medicine

## 2024-02-29 ENCOUNTER — Ambulatory Visit (INDEPENDENT_AMBULATORY_CARE_PROVIDER_SITE_OTHER): Payer: Self-pay | Admitting: Student

## 2024-02-29 VITALS — BP 105/83 | HR 96 | Ht 67.0 in | Wt 133.6 lb

## 2024-02-29 DIAGNOSIS — G629 Polyneuropathy, unspecified: Secondary | ICD-10-CM

## 2024-02-29 DIAGNOSIS — H9319 Tinnitus, unspecified ear: Secondary | ICD-10-CM

## 2024-02-29 MED ORDER — PREGABALIN 25 MG PO CAPS
25.0000 mg | ORAL_CAPSULE | Freq: Two times a day (BID) | ORAL | 1 refills | Status: DC
Start: 2024-02-29 — End: 2024-05-11

## 2024-02-29 NOTE — Progress Notes (Signed)
 SUBJECTIVE:   CHIEF COMPLAINT / HPI:   Tingling in fingers (Left) They experience numbness and tingling in their hands, initially occurring during sleep but now present during activities such as working, driving, and holding a phone. They have a history of carpal tunnel syndrome and underwent surgery on one hand, but symptoms persist and have worsened. The sensation is described as 'tingling and hurts' and 'like needles and stuff,' particularly when using their hands. They also have neuropathy and have been prescribed gabapentin , which they do not take regularly due to its sedative effects. No issues with shoulder or arm.  Ringing in ears They have been experiencing ringing in the ears for a couple of months, which sometimes becomes loud enough to interfere with hearing. The ringing can occur in either ear and occasionally affects their balance, especially when they are tired. No history of exposure to loud noises or ear trauma is reported. No changes in vision or balance.  PERTINENT  PMH / PSH:   OBJECTIVE:  BP 105/83   Pulse 96   Ht 5' 7 (1.702 m)   Wt 133 lb 9.6 oz (60.6 kg)   SpO2 98%   BMI 20.92 kg/m  Physical Exam Constitutional:      Appearance: He is well-developed.  HENT:     Right Ear: Tympanic membrane, ear canal and external ear normal. No swelling or tenderness. No middle ear effusion. Tympanic membrane is not injected, perforated, erythematous or bulging.     Left Ear: Tympanic membrane, ear canal and external ear normal. No swelling or tenderness.  No middle ear effusion. Tympanic membrane is not injected, perforated, erythematous or bulging.   Neurological:     Mental Status: He is alert.     Cranial Nerves: Cranial nerves 2-12 are intact. No cranial nerve deficit, dysarthria or facial asymmetry.     Sensory: Sensory deficit present.     Motor: Motor function is intact. No weakness, tremor, atrophy or abnormal muscle tone.     Comments: Sensation slightly decreased  in left hand     ASSESSMENT/PLAN:   Assessment & Plan Neuropathy Patient comes in with complaint of tingling sensation in her left hand, more than right, this been an issue for the last 2 to 3 months.  Patient has history of carpal tunnel syndrome, but reports pain is not in the wrist.  Patient denies any shoulder neck pain arm pain.  Patient had normal neuroexam except for sensation slightly decreased in left hand.  Patient had recent evaluation for blood work, showing no signs of anemia/vitamin deficiency/HIV.  Will test for syphilis.  Some concern for unilateral neuropathy, as opposed to bilateral neuropathy is appreciated with diabetes.  Patient reports having tried gabapentin  without success, for it was too sedating.  Will try Lyrica.  Will also have patient follow-up if neuropathy not improving, will consider neurology referral. - Lyrica 25 mg twice daily - Follow-up as needed - Consider neurology referral Tinnitus, unspecified laterality Patient comes in with complaint of tinnitus has been an issue for recent time.  Patient reports reading noise goes between ears, last few minutes at a time.  Patient denies any history of trauma, or loud noise exposure.  Patient has normal exam on ear exam.  Will send patient to ENT for evaluation.  Patient has no insurance, is awaiting orange card with [facility].  Will recommend referral to ENT, and follow-up once he has orange card. - Referral to ENT - Orange card No follow-ups on file. Penne Rhein, MD  02/29/2024, 3:31 PM PGY-3, [facility]

## 2024-02-29 NOTE — Assessment & Plan Note (Addendum)
 Patient comes in with complaint of tinnitus has been an issue for recent time.  Patient reports reading noise goes between ears, last few minutes at a time.  Patient denies any history of trauma, or loud noise exposure.  Patient has normal exam on ear exam.  Will send patient to ENT for evaluation.  Patient has no insurance, is awaiting orange card with [facility].  Will recommend referral to ENT, and follow-up once he has orange card. - Referral to ENT - Orange card

## 2024-02-29 NOTE — Patient Instructions (Signed)
 It was great to see you! Thank you for allowing me to participate in your care!  I recommend that you always bring your medications to each appointment as this makes it easy to ensure we are on the correct medications and helps us  not miss when refills are needed.  Our plans for today:  - Neuropathy  We are going to try a different medication. Take it early in the evening 1-2 hours before bed   Take Lyrica 25 mg twice a day   Also testing for Syphilis   If symptoms not improved, will consider sending you to Neurology  - Ringing in ears We usually like you to be seen by an Ear Nose and Throat (ENT) doctor if you have ear ringing. When you get your [facility] Card schedule an appointment with the ENT doctors  Referral placed, make appointment once you have orange card  Take care and seek immediate care sooner if you develop any concerns.   Dr. Penne Rhein, MD [facility] Medicine

## 2024-02-29 NOTE — Assessment & Plan Note (Addendum)
 Patient comes in with complaint of tingling sensation in her left hand, more than right, this been an issue for the last 2 to 3 months.  Patient has history of carpal tunnel syndrome, but reports pain is not in the wrist.  Patient denies any shoulder neck pain arm pain.  Patient had normal neuroexam except for sensation slightly decreased in left hand.  Patient had recent evaluation for blood work, showing no signs of anemia/vitamin deficiency/HIV.  Will test for syphilis.  Some concern for unilateral neuropathy, as opposed to bilateral neuropathy is appreciated with diabetes.  Patient reports having tried gabapentin  without success, for it was too sedating.  Will try Lyrica.  Will also have patient follow-up if neuropathy not improving, will consider neurology referral. - Lyrica 25 mg twice daily - Follow-up as needed - Consider neurology referral

## 2024-02-29 NOTE — Telephone Encounter (Signed)
 Called patient. He reports that since his last visit he has been having increased episodes of numbness in his left hand. He does report a history or carpal tunnel and that the numbness has worsened over the last few weeks.   He also reports ringing in his ears. He believes that this is primarily in the left ear. States that this has also been present for several days.   Denies facial drooping, slurred speech, gait abnormalities, arm weakness, dizziness, new vision changes (states that vision has been poor for awhile now).   Scheduled for same day evaluation in [facility].  ED precautions discussed in the meantime.   Chiquita JAYSON English, RN

## 2024-03-02 LAB — RPR: RPR Ser Ql: REACTIVE — AB

## 2024-03-02 LAB — RPR, QUANT+TP ABS (REFLEX)
Rapid Plasma Reagin, Quant: 1:1 {titer} — ABNORMAL HIGH
T Pallidum Abs: NONREACTIVE

## 2024-03-03 ENCOUNTER — Other Ambulatory Visit: Payer: Self-pay

## 2024-03-03 DIAGNOSIS — G894 Chronic pain syndrome: Secondary | ICD-10-CM

## 2024-03-03 MED ORDER — HYDROCODONE-ACETAMINOPHEN 5-325 MG PO TABS: 1.0000 | ORAL_TABLET | Freq: Three times a day (TID) | ORAL | 0 refills | Status: DC | PRN

## 2024-03-09 ENCOUNTER — Other Ambulatory Visit: Payer: Self-pay

## 2024-03-09 ENCOUNTER — Ambulatory Visit: Payer: Self-pay | Admitting: Student

## 2024-03-09 ENCOUNTER — Other Ambulatory Visit: Payer: Self-pay | Admitting: Student

## 2024-03-09 DIAGNOSIS — G629 Polyneuropathy, unspecified: Secondary | ICD-10-CM

## 2024-03-09 DIAGNOSIS — R768 Other specified abnormal immunological findings in serum: Secondary | ICD-10-CM

## 2024-03-09 NOTE — Patient Instructions (Signed)
 Visit Information  Mr. Andrew Huerta was given information about Medicaid Managed Care team care coordination services as a part of their Healthy [facility] Medicaid benefit. Andrew Huerta verbally consented to engagement with the [facility] Managed Care team.   If you are experiencing a medical emergency, please call 911 or report to your local emergency department or urgent care.   If you have a non-emergency medical problem during routine business hours, please contact your provider's office and ask to speak with a nurse.   For questions related to your Healthy [facility] health plan, please call: [phone] or visit the homepage here: [email or web address]  If you would like to schedule transportation through your Healthy [facility] plan, please call the following number at least 2 days in advance of your appointment: [phone]  For information about your ride after you set it up, call Ride Assist at [phone]. Use this number to activate a Will Call pickup, or if your transportation is late for a scheduled pickup. Use this number, too, if you need to make a change or cancel a previously scheduled reservation.  If you need transportation services right away, call [phone]. The after-hours call center is staffed 24 hours to handle ride assistance and urgent reservation requests (including discharges) 365 days a year. Urgent trips include sick visits, hospital discharge requests and life-sustaining treatment.  Call the [facility] Line at [phone], at any time, 24 hours a day, 7 days a week. If you are in danger or need immediate medical attention call 911.  If you would like help to quit smoking, call 1-800-QUIT-NOW (404 213 5781) OR Espaol: 1-855-Djelo-Ya (8-144-664-6430) o para ms informacin haga clic aqu or Text READY to 799-599 to register via text  Andrew Huerta - following are the goals we discussed in your visit today:    Goals Addressed             This Visit's Progress    COMPLETED: BSW VBCI Social Work Air traffic controller   On track    Problems:   Corporate treasurer , Geophysicist/field seismologist , Housing , and no health insurance.  CSW Clinical Goal(s):   Over the next 2 weeks the Patient will work with Child psychotherapist to address concerns related to food insecurity, financial strain, housing, and no health insurance.  Interventions:  SW will provide patient food resources for food insecurity need. SW will also assist patient in completing [facility] Financial Assistance application to help cover colonoscopy.   Patient Goals/Self-Care Activities:  Visit local DSS office to apply for Food Stamps and  Medicaid  Plan:   Telephone follow up appointment with care management team member scheduled for:  02/24/2024 at 9:30AM [facility] Financial Assistance Application).        Patient verbalizes understanding of instructions and care plan provided today and agrees to view in MyChart. Active MyChart status and patient understanding of how to access instructions and care plan via MyChart confirmed with patient.     No further follow-up required.   Andrew Huerta, BSW [place]/VBCI - [employer] Social Worker [phone]   Following is a copy of your plan of care:  There are no care plans that you recently modified to display for this patient.
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
## 2024-03-14 ENCOUNTER — Ambulatory Visit: Payer: Self-pay | Admitting: Cardiology

## 2024-03-14 ENCOUNTER — Ambulatory Visit: Payer: BC Managed Care – PPO

## 2024-03-14 DIAGNOSIS — I472 Ventricular tachycardia, unspecified: Secondary | ICD-10-CM

## 2024-03-14 LAB — CUP PACEART REMOTE DEVICE CHECK
Battery Remaining Longevity: 132 mo
Battery Remaining Percentage: 94 %
Brady Statistic RV Percent Paced: 0 %
Date Time Interrogation Session: 20250714043500
HighPow Impedance: 87 Ohm
Implantable Lead Connection Status: 753985
Implantable Lead Implant Date: 20230414
Implantable Lead Location: 753860
Implantable Lead Model: 672
Implantable Lead Serial Number: 182871
Implantable Pulse Generator Implant Date: 20230414
Lead Channel Impedance Value: 872 Ohm
Lead Channel Setting Pacing Amplitude: 2.5 V
Lead Channel Setting Pacing Pulse Width: 0.4 ms
Lead Channel Setting Sensing Sensitivity: 0.5 mV
Pulse Gen Serial Number: 310848
Zone Setting Status: 755011

## 2024-03-18 ENCOUNTER — Encounter: Payer: Self-pay | Admitting: Gastroenterology

## 2024-03-18 ENCOUNTER — Ambulatory Visit: Admitting: Gastroenterology

## 2024-03-18 ENCOUNTER — Encounter (HOSPITAL_COMMUNITY): Payer: Self-pay | Admitting: Gastroenterology

## 2024-03-18 VITALS — BP 128/70 | HR 87 | Ht 67.0 in | Wt 130.1 lb

## 2024-03-18 DIAGNOSIS — R6881 Early satiety: Secondary | ICD-10-CM | POA: Diagnosis not present

## 2024-03-18 DIAGNOSIS — Z800 Family history of malignant neoplasm of digestive organs: Secondary | ICD-10-CM

## 2024-03-18 DIAGNOSIS — R634 Abnormal weight loss: Secondary | ICD-10-CM | POA: Diagnosis not present

## 2024-03-18 DIAGNOSIS — K2270 Barrett's esophagus without dysplasia: Secondary | ICD-10-CM | POA: Diagnosis not present

## 2024-03-18 DIAGNOSIS — R1013 Epigastric pain: Secondary | ICD-10-CM

## 2024-03-18 NOTE — Patient Instructions (Addendum)
 You have been scheduled for an endoscopy. Please follow written instructions given to you at your visit today.  If you use inhalers (even only as needed), please bring them with you on the day of your procedure.  If you take any of the following medications, they will need to be adjusted prior to your procedure:   DO NOT TAKE 7 DAYS PRIOR TO TEST- Trulicity (dulaglutide) Ozempic , Wegovy (semaglutide ) Mounjaro (tirzepatide) Bydureon Bcise (exanatide extended release)  DO NOT TAKE 1 DAY PRIOR TO YOUR TEST Rybelsus (semaglutide ) Adlyxin (lixisenatide) Victoza (liraglutide) Byetta (exanatide) ___________________________________________________________________________  _______________________________________________________  If your blood pressure at your visit was 140/90 or greater, please contact your primary care physician to follow up on this.  _______________________________________________________  If you are age 58 or older, your body mass index should be between 23-30. Your Body mass index is 20.38 kg/m. If this is out of the aforementioned range listed, please consider follow up with your Primary Care Provider.  If you are age 84 or younger, your body mass index should be between 19-25. Your Body mass index is 20.38 kg/m. If this is out of the aformentioned range listed, please consider follow up with your Primary Care Provider.   ________________________________________________________  The [place] [facility] providers would like to encourage you to use MYCHART to communicate with providers for non-urgent requests or questions.  Due to long hold times on the telephone, sending your provider a message by [facility] may be a faster and more efficient way to get a response.  Please allow 48 business hours for a response.  Please remember that this is for non-urgent requests.  _______________________________________________________

## 2024-03-18 NOTE — Progress Notes (Signed)
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 to medical comorbidities.    Plan:  This patient was put on for an upper endoscopy in the [facility] long endoscopy lab with me 3 days from now and he will hold his Plavix  between now and then (having already taken it today)  If his upper endoscopy is unrevealing for the source of the symptoms, then the next step will be a CT scan abdomen and pelvis with oral and IV contrast before we consider a colonoscopy in him.  My next available hospital outpatient endoscopy slot after 03/21/2024 is not until October.  Thank you for the courtesy of this consult.  Please call me with any questions or concerns.  (Medically complex patient-high degree of medical decision making)  [name]  CC: Referring provider noted above

## 2024-03-18 NOTE — Progress Notes (Signed)
 Attempted to obtain medical history for pre op call via telephone, unable to reach at this time. HIPAA compliant voicemail message left requesting return call to pre surgical testing department.

## 2024-03-21 ENCOUNTER — Ambulatory Visit (HOSPITAL_COMMUNITY): Admitting: Anesthesiology

## 2024-03-21 ENCOUNTER — Ambulatory Visit (HOSPITAL_COMMUNITY)
Admission: RE | Admit: 2024-03-21 | Discharge: 2024-03-21 | Disposition: A | Discharge status: Home / Self Care | Attending: Gastroenterology | Admitting: Gastroenterology

## 2024-03-21 ENCOUNTER — Ambulatory Visit: Payer: Self-pay | Admitting: Family Medicine

## 2024-03-21 ENCOUNTER — Encounter (HOSPITAL_COMMUNITY): Payer: Self-pay | Admitting: Gastroenterology

## 2024-03-21 ENCOUNTER — Encounter (HOSPITAL_COMMUNITY)
Admission: RE | Disposition: A | Discharge status: Home / Self Care | Payer: Self-pay | Source: Home / Self Care | Attending: Gastroenterology

## 2024-03-21 ENCOUNTER — Other Ambulatory Visit: Payer: Self-pay

## 2024-03-21 DIAGNOSIS — I2510 Atherosclerotic heart disease of native coronary artery without angina pectoris: Secondary | ICD-10-CM | POA: Diagnosis not present

## 2024-03-21 DIAGNOSIS — G4730 Sleep apnea, unspecified: Secondary | ICD-10-CM | POA: Diagnosis not present

## 2024-03-21 DIAGNOSIS — E119 Type 2 diabetes mellitus without complications: Secondary | ICD-10-CM | POA: Insufficient documentation

## 2024-03-21 DIAGNOSIS — Z7984 Long term (current) use of oral hypoglycemic drugs: Secondary | ICD-10-CM | POA: Diagnosis not present

## 2024-03-21 DIAGNOSIS — K449 Diaphragmatic hernia without obstruction or gangrene: Secondary | ICD-10-CM | POA: Insufficient documentation

## 2024-03-21 DIAGNOSIS — R1013 Epigastric pain: Secondary | ICD-10-CM

## 2024-03-21 DIAGNOSIS — K2289 Other specified disease of esophagus: Secondary | ICD-10-CM | POA: Diagnosis not present

## 2024-03-21 DIAGNOSIS — I5022 Chronic systolic (congestive) heart failure: Secondary | ICD-10-CM

## 2024-03-21 DIAGNOSIS — Z6820 Body mass index (BMI) 20.0-20.9, adult: Secondary | ICD-10-CM | POA: Diagnosis not present

## 2024-03-21 DIAGNOSIS — I110 Hypertensive heart disease with heart failure: Secondary | ICD-10-CM | POA: Insufficient documentation

## 2024-03-21 DIAGNOSIS — K2270 Barrett's esophagus without dysplasia: Secondary | ICD-10-CM | POA: Diagnosis not present

## 2024-03-21 DIAGNOSIS — R6881 Early satiety: Secondary | ICD-10-CM

## 2024-03-21 DIAGNOSIS — K2970 Gastritis, unspecified, without bleeding: Secondary | ICD-10-CM | POA: Diagnosis not present

## 2024-03-21 DIAGNOSIS — I509 Heart failure, unspecified: Secondary | ICD-10-CM | POA: Diagnosis not present

## 2024-03-21 DIAGNOSIS — R634 Abnormal weight loss: Secondary | ICD-10-CM | POA: Diagnosis not present

## 2024-03-21 DIAGNOSIS — K219 Gastro-esophageal reflux disease without esophagitis: Secondary | ICD-10-CM | POA: Diagnosis not present

## 2024-03-21 DIAGNOSIS — Z79899 Other long term (current) drug therapy: Secondary | ICD-10-CM | POA: Diagnosis not present

## 2024-03-21 DIAGNOSIS — K2961 Other gastritis with bleeding: Secondary | ICD-10-CM

## 2024-03-21 DIAGNOSIS — Z7902 Long term (current) use of antithrombotics/antiplatelets: Secondary | ICD-10-CM | POA: Insufficient documentation

## 2024-03-21 DIAGNOSIS — R1010 Upper abdominal pain, unspecified: Secondary | ICD-10-CM

## 2024-03-21 DIAGNOSIS — K3189 Other diseases of stomach and duodenum: Secondary | ICD-10-CM | POA: Diagnosis not present

## 2024-03-21 DIAGNOSIS — Z800 Family history of malignant neoplasm of digestive organs: Secondary | ICD-10-CM | POA: Insufficient documentation

## 2024-03-21 DIAGNOSIS — R1030 Lower abdominal pain, unspecified: Secondary | ICD-10-CM | POA: Diagnosis not present

## 2024-03-21 DIAGNOSIS — K22711 Barrett's esophagus with high grade dysplasia: Secondary | ICD-10-CM

## 2024-03-21 HISTORY — PX: BONE BIOPSY: SHX375

## 2024-03-21 HISTORY — PX: ESOPHAGOGASTRODUODENOSCOPY: SHX5428

## 2024-03-21 LAB — GLUCOSE, CAPILLARY: Glucose-Capillary: 118 mg/dL — ABNORMAL HIGH (ref 70–99)

## 2024-03-21 SURGERY — EGD (ESOPHAGOGASTRODUODENOSCOPY)
Anesthesia: Monitor Anesthesia Care

## 2024-03-21 MED ORDER — SODIUM CHLORIDE 0.9 % IV SOLN: INTRAVENOUS | Status: DC

## 2024-03-21 MED ORDER — PROPOFOL 500 MG/50ML IV EMUL
INTRAVENOUS | Status: DC | PRN
  Administered 2024-03-21: 130 mg via INTRAVENOUS
  Administered 2024-03-21: 150 ug/kg/min via INTRAVENOUS
  Administered 2024-03-21: 30 mg via INTRAVENOUS
  Administered 2024-03-21: 40 mg via INTRAVENOUS

## 2024-03-21 MED ORDER — GLYCOPYRROLATE PF 0.2 MG/ML IJ SOSY
PREFILLED_SYRINGE | INTRAMUSCULAR | Status: DC | PRN
  Administered 2024-03-21: .15 mg via INTRAVENOUS

## 2024-03-21 MED ORDER — LIDOCAINE 2% (20 MG/ML) 5 ML SYRINGE
INTRAMUSCULAR | Status: DC | PRN
  Administered 2024-03-21: 60 mg via INTRAVENOUS

## 2024-03-21 NOTE — Op Note (Signed)
 [facility] Patient Name: Andrew Huerta Procedure Date: 03/21/2024 MRN: [id number] Attending MD: Victory CROME. Huerta , MD, [id number] Date of Birth: [birth year] CSN: [id number] Age: 58 Admit Type: Outpatient Procedure:                Upper GI endoscopy Indications:              Epigastric abdominal pain, Early satiety, Weight                            loss, Family history of gastric cancer                           clinical details in office consult note dated                            03/18/24 Providers:                Victory L. Legrand, MD, Collene Edu, RN, Coye Bade, Technician Referring MD:             Twyla Nearing [facility]) Medicines:                Monitored Anesthesia Care Complications:            No immediate complications. Estimated Blood Loss:     Estimated blood loss was minimal. Procedure:                Pre-Anesthesia Assessment:                           - Prior to the procedure, a History and Physical                            was performed, and patient medications and                            allergies were reviewed. The patient's tolerance of                            previous anesthesia was also reviewed. The risks                            and benefits of the procedure and the sedation                            options and risks were discussed with the patient.                            All questions were answered, and informed consent                            was obtained. Prior Anticoagulants: The patient has  taken Plavix  (clopidogrel ), last dose was 3 days                            prior to procedure. ASA Grade Assessment: IV - A                            patient with severe systemic disease that is a                            constant threat to life. After reviewing the risks                            and benefits, the patient was deemed in                             satisfactory condition to undergo the procedure.                           After obtaining informed consent, the endoscope was                            passed under direct vision. Throughout the                            procedure, the patient's blood pressure, pulse, and                            oxygen saturations were monitored continuously. The                            [id number]-H190 ([id number]) Olympus endoscope was introduced                            through the mouth, and advanced to the second part                            of duodenum. The upper GI endoscopy was                            accomplished without difficulty. The patient                            tolerated the procedure well. Scope In: Scope Out: Findings:      The larynx was normal.      Two small In[place] patches were seen in the proximal esophagus.      Three tongues and a few small islands of salmon-colored mucosa were       present in the distal esophagus. Subtle nodularity was present on the       distal aspect of one tongue, seen best on NBI (see photos). The maximum       longitudinal extent of these esophageal mucosal changes was 2-3 cm in       length. Biopsies were taken with a cold forceps for histology from the  nodular area (Jar 3), and separately from multiple areas in the other       salmon-colored mucosa (all of those in Jar 4).      A 2 cm hiatal hernia was present. Diaphragmatic indentation at 35cm from       teeth, upper limit gastric folds at 33cm, upper limit salmon mucosa       30-31cm.      Diffuse inflammation characterized by congestion (edema) and erythema       was found in the entire examined stomach. Scattered flecks of heme in       the pre-pyloric area. Biopsies were taken with a cold forceps for       histology. (Antrum in Jar 1, body in jar 2)      The cardia and gastric fundus were normal on retroflexion. Stomach       distended well with insufflation, but it was somewhat  difficult to       maintain it on retroflexion due to the hiatal hernia.      The examined duodenum was normal. Impression:               - Normal larynx.                           - Salmon-colored mucosa suspicious for                            short-segment Barrett's esophagus. Area of                            nodularity within that finding. Biopsied.                           - 2 cm hiatal hernia.                           - Gastritis. Biopsied.                           - Normal examined duodenum.                           These findings do not clearly explain the patient's                            reported symptoms. Moderate Sedation:      MAC sedation used Recommendation:           - Patient has a contact number available for                            emergencies. The signs and symptoms of potential                            delayed complications were discussed with the                            patient. Return to normal activities tomorrow.  Written discharge instructions were provided to the                            patient.                           - Resume previous diet.                           - Resume Plavix  (clopidogrel ) at prior dose                            tomorrow.                           - Await pathology results.                           - Perform a CT scan (computed tomography) of                            abdomen with contrast and pelvis with contrast at                            appointment to be scheduled as soon as possible.                            (see plan in 03/18/24 office consult note) Procedure Code(s):        --- Professional ---                           [phone], Esophagogastroduodenoscopy, flexible,                            transoral; with biopsy, single or multiple Diagnosis Code(s):        --- Professional ---                           K22.89, Other specified disease of esophagus                            K44.9, Diaphragmatic hernia without obstruction or                            gangrene                           K29.70, Gastritis, unspecified, without bleeding                           R10.13, Epigastric pain                           R68.81, Early satiety                           R63.4, Abnormal weight loss  Z80.0, Family history of malignant neoplasm of                            digestive organs CPT copyright 2022 American Medical Association. All rights reserved. The codes documented in this report are preliminary and upon coder review may  be revised to meet current compliance requirements. Andrew Morace L. Legrand, MD 03/21/2024 10:01:30 AM This report has been signed electronically. Number of Addenda: 0

## 2024-03-21 NOTE — Anesthesia Postprocedure Evaluation (Signed)
 Anesthesia Post Note  Patient: Andrew Huerta  Procedure(s) Performed: EGD (ESOPHAGOGASTRODUODENOSCOPY) BIOPSY, GI     Patient location during evaluation: Endoscopy Anesthesia Type: MAC Level of consciousness: oriented, awake and alert and awake Pain management: pain level controlled Vital Signs Assessment: post-procedure vital signs reviewed and stable Respiratory status: spontaneous breathing, nonlabored ventilation, respiratory function stable and patient connected to nasal cannula oxygen Cardiovascular status: blood pressure returned to baseline and stable Postop Assessment: no headache, no backache and no apparent nausea or vomiting Anesthetic complications: no   No notable events documented.  Last Vitals:  Vitals:   03/21/24 1000 03/21/24 1010  BP: 106/73 (!) 151/96  Pulse: 74 61  Resp: 15 12  Temp:    SpO2: 97% 99%    Last Pain:  Vitals:   03/21/24 1010  TempSrc:   PainSc: 0-No pain                 Garnette FORBES Skillern

## 2024-03-21 NOTE — Transfer of Care (Signed)
 Immediate Anesthesia Transfer of Care Note  Patient: Andrew Huerta  Procedure(s) Performed: EGD (ESOPHAGOGASTRODUODENOSCOPY) BIOPSY, GI  Patient Location: Endoscopy Unit  Anesthesia Type:MAC  Level of Consciousness: alert  and patient cooperative  Airway & Oxygen Therapy: Patient Spontanous Breathing  Post-op Assessment: Report given to RN and Post -op Vital signs reviewed and stable  Post vital signs: Reviewed and stable  Last Vitals:  Vitals Value Taken Time  BP 102/55 03/21/24 09:52  Temp    Pulse 75 03/21/24 09:53  Resp 22 03/21/24 09:53  SpO2 98 % 03/21/24 09:53  Vitals shown include unfiled device data.  Last Pain:  Vitals:   03/21/24 0952  TempSrc: Temporal  PainSc: 0-No pain         Complications: No notable events documented.
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
## 2024-03-21 NOTE — Discharge Instructions (Signed)

## 2024-03-21 NOTE — Interval H&P Note (Signed)
 History and Physical Interval Note:  03/21/2024 9:19 AM  Andrew Huerta  has presented today for surgery, with the diagnosis of Early satiety [R68.81], Epigastric pain [R10.13], Weight loss [R63.4], Family history- stomach cancer [Z80.0].  The various methods of treatment have been discussed with the patient and family. After consideration of risks, benefits and other options for treatment, the patient has consented to  Procedure(s): EGD (ESOPHAGOGASTRODUODENOSCOPY) (N/A) as a surgical intervention.  The patient's history has been reviewed, patient examined, no change in status, stable for surgery.  I have reviewed the patient's chart and labs.  Questions were answered to the patient's satisfaction.     [name]

## 2024-03-21 NOTE — Anesthesia Procedure Notes (Signed)
 Procedure Name: MAC Date/Time: 03/21/2024 9:23 AM  Performed by: Erick Fitz, CRNAPre-anesthesia Checklist: Patient identified, Emergency Drugs available, Suction available, Patient being monitored and Timeout performed Patient Re-evaluated:Patient Re-evaluated prior to induction Oxygen Delivery Method: Simple face mask Preoxygenation: Pre-oxygenation with 100% oxygen Induction Type: IV induction Placement Confirmation: positive ETCO2 Dental Injury: Teeth and Oropharynx as per pre-operative assessment

## 2024-03-21 NOTE — Anesthesia Preprocedure Evaluation (Addendum)
 Anesthesia Evaluation  Patient identified by MRN, date of birth, ID band Patient awake    Reviewed: Allergy & Precautions, NPO status , Patient's Chart, lab work & pertinent test results, reviewed documented beta blocker date and time   Airway Mallampati: II  TM Distance: >3 FB Neck ROM: Full    Dental  (+) Dental Advisory Given, Lower Dentures, Upper Dentures   Pulmonary sleep apnea    Pulmonary exam normal breath sounds clear to auscultation       Cardiovascular hypertension, Pt. on medications and Pt. on home beta blockers + CAD, + Cardiac Stents and +CHF  Normal cardiovascular exam(-) dysrhythmias + Cardiac Defibrillator  Rhythm:Regular Rate:Normal  Echo 03/03/22:  1. Left ventricular ejection fraction, by estimation, is 40 to 45%. The  left ventricle has mildly decreased function. The left ventricle  demonstrates regional wall motion abnormalities (see scoring  diagram/findings for description). Left ventricular  diastolic parameters are indeterminate.   2. Right ventricular systolic function is normal. The right ventricular  size is normal. Tricuspid regurgitation signal is inadequate for assessing  PA pressure.   3. Right atrial size was mildly dilated.   4. The mitral valve is normal in structure. No evidence of mitral valve  regurgitation. No evidence of mitral stenosis.   5. The aortic valve is tricuspid. Aortic valve regurgitation is not  visualized. Aortic valve sclerosis is present, with no evidence of aortic  valve stenosis.   6. The inferior vena cava is normal in size with greater than 50%  respiratory variability, suggesting right atrial pressure of 3 mmHg.     Neuro/Psych  Headaches, Seizures -,  PSYCHIATRIC DISORDERS Anxiety Depression     Neuromuscular disease    GI/Hepatic Neg liver ROS, hiatal hernia,GERD  Medicated,,  Endo/Other  diabetes, Type 2, Oral Hypoglycemic Agents Hyperthyroidism Primary  hyperaldosteronism s/p adrenalectomy   Renal/GU Renal disease     Musculoskeletal  (+) Arthritis ,    Abdominal   Peds  Hematology  (+) Blood dyscrasia (Plavix )   Anesthesia Other Findings Day of surgery medications reviewed with the patient.  Reproductive/Obstetrics                              Anesthesia Physical Anesthesia Plan  ASA: 4  Anesthesia Plan: MAC   Post-op Pain Management:    Induction: Intravenous  PONV Risk Score and Plan: 1 and TIVA and Treatment may vary due to age or medical condition  Airway Management Planned: Natural Airway and Simple Face Mask  Additional Equipment:   Intra-op Plan:   Post-operative Plan:   Informed Consent: I have reviewed the patients History and Physical, chart, labs and discussed the procedure including the risks, benefits and alternatives for the proposed anesthesia with the patient or authorized representative who has indicated his/her understanding and acceptance.     Dental advisory given  Plan Discussed with: CRNA and Anesthesiologist  Anesthesia Plan Comments:          Anesthesia Quick Evaluation

## 2024-03-22 ENCOUNTER — Telehealth: Payer: Self-pay

## 2024-03-22 ENCOUNTER — Encounter (HOSPITAL_COMMUNITY): Payer: Self-pay | Admitting: Gastroenterology

## 2024-03-22 ENCOUNTER — Other Ambulatory Visit: Payer: Self-pay

## 2024-03-22 DIAGNOSIS — R1013 Epigastric pain: Secondary | ICD-10-CM

## 2024-03-22 DIAGNOSIS — R6881 Early satiety: Secondary | ICD-10-CM

## 2024-03-22 DIAGNOSIS — R634 Abnormal weight loss: Secondary | ICD-10-CM

## 2024-03-22 NOTE — Telephone Encounter (Signed)
 Thanks for scheduling the scan.  I told him at the office visit that the timing of a colonoscopy would depend on scan findings and also when I could find a slot amongst the already overbooked hospital outpatient slots for the next 3 months.  - H. Legrand, MD

## 2024-03-22 NOTE — Telephone Encounter (Signed)
 Pt informed of provider msg and states understanding.

## 2024-03-22 NOTE — Telephone Encounter (Signed)
-----   Message from [name] sent at 03/21/2024 10:02 AM EDT ----- Regarding: CTAP order [place],    Please see my EGD report from today and facilitate a CT scan of the abdomen and pelvis with oral and IV contrast on this patient , ideally within next two weeks.  Indications:  epigastric pain, weight loss, early satiety - no definite source on EGD.  Hs creatinine was normal on 02/22/24.  Thanks  - HD

## 2024-03-22 NOTE — Telephone Encounter (Signed)
 [name], RN ordered CT this am. CMA scheduled CT for 03/23/24 at 8 am. Portal msg with instructions sent to pt. Pt called and advised of CT appt as well.   Pt asked about a colonoscopy. He states when he was leaving the [facility] someone mentioned he would need one. He was wondering when that would be scheduled.   Please advise.

## 2024-03-23 ENCOUNTER — Ambulatory Visit (HOSPITAL_COMMUNITY)
Admission: RE | Admit: 2024-03-23 | Discharge: 2024-03-23 | Disposition: A | Discharge status: Home / Self Care | Source: Ambulatory Visit | Attending: Gastroenterology | Admitting: Gastroenterology

## 2024-03-23 DIAGNOSIS — R634 Abnormal weight loss: Secondary | ICD-10-CM | POA: Insufficient documentation

## 2024-03-23 DIAGNOSIS — N200 Calculus of kidney: Secondary | ICD-10-CM | POA: Diagnosis not present

## 2024-03-23 DIAGNOSIS — K8020 Calculus of gallbladder without cholecystitis without obstruction: Secondary | ICD-10-CM | POA: Diagnosis not present

## 2024-03-23 DIAGNOSIS — R1013 Epigastric pain: Secondary | ICD-10-CM | POA: Diagnosis not present

## 2024-03-23 MED ORDER — SODIUM CHLORIDE (PF) 0.9 % IJ SOLN
INTRAMUSCULAR | Status: AC
Start: 2024-03-23 — End: 2024-03-23
  Filled 2024-03-23: qty 50

## 2024-03-23 MED ORDER — IOHEXOL 300 MG/ML  SOLN
100.0000 mL | Freq: Once | INTRAMUSCULAR | Status: AC | PRN
  Administered 2024-03-23: 100 mL via INTRAVENOUS

## 2024-03-24 ENCOUNTER — Other Ambulatory Visit: Payer: Self-pay

## 2024-03-24 ENCOUNTER — Telehealth: Payer: Self-pay

## 2024-03-24 ENCOUNTER — Ambulatory Visit: Payer: Self-pay | Admitting: Gastroenterology

## 2024-03-24 ENCOUNTER — Other Ambulatory Visit: Payer: Self-pay | Admitting: Family Medicine

## 2024-03-24 DIAGNOSIS — R1013 Epigastric pain: Secondary | ICD-10-CM

## 2024-03-24 DIAGNOSIS — R109 Unspecified abdominal pain: Secondary | ICD-10-CM

## 2024-03-24 DIAGNOSIS — R634 Abnormal weight loss: Secondary | ICD-10-CM

## 2024-03-24 DIAGNOSIS — N521 Erectile dysfunction due to diseases classified elsewhere: Secondary | ICD-10-CM

## 2024-03-24 LAB — SURGICAL PATHOLOGY

## 2024-03-24 MED ORDER — NA SULFATE-K SULFATE-MG SULF 17.5-3.13-1.6 GM/177ML PO SOLN: 1.0000 | Freq: Once | ORAL | 0 refills | Status: AC

## 2024-03-24 NOTE — Telephone Encounter (Signed)
 Colonoscopy scheduled for 04/28/2024. Case ID # [id number]. Procedure start time 11:15. Cardiac medication clearance requested for Plavix . Called patient and discussed instructions. Prep sent to preferred pharmacy. Patient states that last time he did a barium study he vomited a large amount of the barium up. Patient would like to know if we can prescribe medication for prior to gastric emptying study.

## 2024-03-24 NOTE — Telephone Encounter (Signed)
 [facility] Pre-operative Risk Assessment     Request for surgical clearance:     Endoscopy Procedure  What type of surgery is being performed?     Colonoscopy  When is this surgery scheduled?     04/28/2024  What type of clearance is required ?   Pharmacy  Are there any medications that need to be held prior to surgery and how long? Plavix , 5-7 days  Practice name and name of physician performing surgery?      [facility] Dr. Victory Brand  What is your office phone and fax number?      Phone- [phone]  Fax- [phone]  Anesthesia type (None, local, MAC, general) ?       MAC   Please route your response to Daphne Moats, RN

## 2024-03-25 NOTE — Telephone Encounter (Signed)
   Patient Name: Andrew Huerta  DOB: 1966-04-06 MRN: [id number]  Primary Cardiologist: Peter Swaziland, MD  Chart reviewed as part of pre-operative protocol coverage. Given past medical history and time since last visit, based on ACC/AHA guidelines, Andrew Huerta is at acceptable risk for the planned procedure without further cardiovascular testing.   Per office protocol, if patient is without any new symptoms or concerns at the time of their virtual visit, she may hold Plavix   for 5 days prior to procedure. Please resume Plavix  as soon as possible postprocedure, at the discretion of the surgeon.    The patient was advised that if he develops new symptoms prior to surgery to contact our office to arrange for a follow-up visit, and he verbalized understanding.  I will route this recommendation to the requesting party via Epic fax function and remove from pre-op pool.  Please call with questions.  Lamarr Satterfield, NP 03/25/2024, 8:13 AM

## 2024-03-25 NOTE — Telephone Encounter (Signed)
 Clearance received and pt informed to hold Plavix  5 days prior to colon scheduled 04/28/24. He has no questions at this time.   Electronically signed:  Corean Amsterdam, NCMA

## 2024-03-27 ENCOUNTER — Other Ambulatory Visit (HOSPITAL_COMMUNITY): Payer: Self-pay | Admitting: Internal Medicine

## 2024-03-28 ENCOUNTER — Ambulatory Visit: Payer: Self-pay | Admitting: Gastroenterology

## 2024-03-28 ENCOUNTER — Other Ambulatory Visit (HOSPITAL_COMMUNITY): Payer: Self-pay

## 2024-04-04 ENCOUNTER — Other Ambulatory Visit: Payer: Self-pay

## 2024-04-04 DIAGNOSIS — G894 Chronic pain syndrome: Secondary | ICD-10-CM

## 2024-04-04 MED ORDER — HYDROCODONE-ACETAMINOPHEN 5-325 MG PO TABS: 1.0000 | ORAL_TABLET | Freq: Three times a day (TID) | ORAL | 0 refills | Status: DC | PRN

## 2024-04-07 ENCOUNTER — Encounter (HOSPITAL_COMMUNITY): Payer: Self-pay

## 2024-04-07 ENCOUNTER — Encounter (HOSPITAL_COMMUNITY)
Admission: RE | Admit: 2024-04-07 | Discharge: 2024-04-07 | Disposition: A | Discharge status: Home / Self Care | Source: Ambulatory Visit | Attending: Gastroenterology | Admitting: Gastroenterology

## 2024-04-07 DIAGNOSIS — R1013 Epigastric pain: Secondary | ICD-10-CM | POA: Insufficient documentation

## 2024-04-07 DIAGNOSIS — R109 Unspecified abdominal pain: Secondary | ICD-10-CM | POA: Diagnosis not present

## 2024-04-07 DIAGNOSIS — R634 Abnormal weight loss: Secondary | ICD-10-CM | POA: Insufficient documentation

## 2024-04-07 MED ORDER — TECHNETIUM TC 99M SULFUR COLLOID
2.2000 | Freq: Once | INTRAVENOUS | Status: AC
  Administered 2024-04-07: 2.2 via ORAL

## 2024-04-11 ENCOUNTER — Ambulatory Visit

## 2024-04-11 ENCOUNTER — Telehealth: Payer: Self-pay | Admitting: Gastroenterology

## 2024-04-11 VITALS — BP 120/88 | HR 90 | Temp 98.1°F | Ht 67.0 in | Wt 126.4 lb

## 2024-04-11 DIAGNOSIS — K219 Gastro-esophageal reflux disease without esophagitis: Secondary | ICD-10-CM

## 2024-04-11 DIAGNOSIS — K449 Diaphragmatic hernia without obstruction or gangrene: Secondary | ICD-10-CM

## 2024-04-11 DIAGNOSIS — K920 Hematemesis: Secondary | ICD-10-CM

## 2024-04-11 MED ORDER — PANTOPRAZOLE SODIUM 40 MG PO TBEC: 40.0000 mg | DELAYED_RELEASE_TABLET | Freq: Two times a day (BID) | ORAL | 1 refills | Status: AC

## 2024-04-11 MED ORDER — ONDANSETRON 4 MG PO TBDP: 4.0000 mg | ORAL_TABLET | Freq: Three times a day (TID) | ORAL | 0 refills | Status: DC | PRN

## 2024-04-11 NOTE — Telephone Encounter (Signed)
 Inbound call from patient stating he vomited blood last night. Also stated he saw he PCP and his acid reflux medication was upped. Also stated he was given anti vomiting medication as well. Requesting a urgent call back. Please advise, thank you

## 2024-04-11 NOTE — Patient Instructions (Signed)
 I have sent in Zofran  under the tongue to your pharmacy. I have also doubled your pantoprazole  to 40 mg twice a day. Please schedule an appointment with your GI doctor.

## 2024-04-11 NOTE — Telephone Encounter (Signed)
 Pt states he vomited blood (dark red) last night large quantity for 20 min.  He has some soreness around ribcage.  He states he saw PCP today and was given something for nausea. He saw them for the episode of vomiting blood last night.    He was also told increase his pantoprazole  40 mg to twice daily .  He has colonoscopy set up the end of the month.    Please advise

## 2024-04-12 ENCOUNTER — Ambulatory Visit: Payer: Self-pay | Admitting: Gastroenterology

## 2024-04-12 NOTE — Telephone Encounter (Signed)
 Mild gastritis on recent EGD, as the only potential source for the reported hematemesis. Any GI bleeding that occurs will be exacerbated by his Plavix .  Most important thing is antiemetic (ondansetron  as prescribed by primary care. Because of his dull upper abdominal discomfort and early satiety still not entirely clear.  Gallstones found on imaging are not clearly the source since he has had nausea and early satiety with little or no pain as would be expected with gallstones.  He is also a high risk candidate for surgery.  Proceed with colonoscopy as scheduled.  VEAR Brand MD

## 2024-04-12 NOTE — Progress Notes (Cosign Needed)
    SUBJECTIVE:   CHIEF COMPLAINT / HPI:   Vomiting blood  Patient was vomiting and having diarrhea last night. He states there were too many times to count. He explains that his vomit began as just bile, but then started to have dark red blood in it. He has not checked his stool for blood. He states his stools tend to be watery, but he does mention they have seemed darker than usual. Patient states he has had a significantly reduced appetite and only ate some dry cereal yesterday. He endorses having really bad heartburn prior to the start of his vomiting. He has not vomited again since last night. He was able to eat some broth today and keep it down, although he felt nauseous. He is currently have abdominal pain that is a 7/10 and that the pain radiates around to his back bilaterally. With his decreased appetite, the patient states he has lost 23 pounds over 3 months. He states this is not the first time this has happened. He last vomited blood about 3-4 months ago. Patient is being followed by GI. He recently had an EGD that showed a segment suspicious for Barrett's esophagus, a hiatal hernia, and gastritis. He is scheduled with GI for a colonoscopy on the 28th of this month. Patient's father had gastric cancer. He also states that he wonders if this could be stress related. Patient states he recently lost his job.   PERTINENT  PMH / PSH: Barrett's esophagus, gastritis  OBJECTIVE:   BP 120/88   Pulse 90   Temp 98.1 F (36.7 C) (Oral)   Ht 5' 7 (1.702 m)   Wt 126 lb 6 oz (57.3 kg)   SpO2 98%   BMI 19.79 kg/m   General: chronically-ill appearing male, no acute distress Cardiovascular: RRR, no m/r/g  Respiratory: CTAB, normal work of breathing on room air Abdomen: diffusely tender to palpation, especially over the LUQ and LLQ, bowel sounds present, flat, non-distended, no guarding  MSK: tenderness to palpation lateral to lower thoracic spine bilaterally   ASSESSMENT/PLAN:   Assessment  & Plan Hematemesis with nausea Zofran  OTD 4 mg, sent in for nausea. Recommended patient to call his GI office and make an appointment with them.  Hiatal hernia with GERD Increased patient's 40 mg of pantoprazole  to twice daily. Recommended follow-up with GI as above.     Raguel KANDICE Lee, DO [facility]

## 2024-04-12 NOTE — Telephone Encounter (Signed)
 The patient has been notified of this information and all questions answered.

## 2024-04-12 NOTE — Progress Notes (Signed)
   SUBJECTIVE:   CHIEF COMPLAINT / HPI:   Vomiting blood  Patient was vomiting and having diarrhea last night. He states there were too many times to count. He explains that his vomit began as just bile, but then started to have dark red blood in it. He has not checked his stool for blood. He states his stools tend to be watery, but he does mention they have seemed darker than usual. Patient states he has had a significantly reduced appetite and only ate some dry cereal yesterday. He endorses having really bad heartburn prior to the start of his vomiting. He has not vomited again since last night. He was able to eat some broth today and keep it down, although he felt nauseous. He is currently have abdominal pain that is a 7/10 and that the pain radiates around to his back bilaterally. With his decreased appetite, the patient states he has lost 23 pounds over 3 months. He states this is not the first time this has happened. He last vomited blood about 3-4 months ago. Patient is being followed by GI. He recently had an EGD that showed a segment suspicious for Barrett's esophagus, a hiatal hernia, and gastritis. He is scheduled with GI for a colonoscopy on the 28th of this month. Patient's father had gastric cancer. He also states that he wonders if this could be stress related. Patient states he recently lost his job.   PERTINENT  PMH / PSH: Barrett's esophagus, gastritis  OBJECTIVE:   BP 120/88   Pulse 90   Temp 98.1 F (36.7 C) (Oral)   Ht 5' 7 (1.702 m)   Wt 126 lb 6 oz (57.3 kg)   SpO2 98%   BMI 19.79 kg/m   General: chronically-ill appearing male, no acute distress Cardiovascular: RRR, no m/r/g  Respiratory: CTAB, normal work of breathing on room air Abdomen: diffusely tender to palpation, especially over the LUQ and LLQ, bowel sounds present, flat, non-distended, no guarding  MSK: tenderness to palpation lateral to lower thoracic spine bilaterally   ASSESSMENT/PLAN:   Assessment  & Plan Hematemesis with nausea Zofran  OTD 4 mg, sent in for nausea. Recommended patient to call his GI office and make an appointment with them.  Hiatal hernia with GERD Increased patient's 40 mg of pantoprazole  to twice daily. Recommended follow-up with GI as above.     Raguel KANDICE Lee, DO [facility]

## 2024-04-18 ENCOUNTER — Encounter (HOSPITAL_COMMUNITY): Payer: Self-pay | Admitting: Gastroenterology

## 2024-04-18 NOTE — Progress Notes (Signed)
  PCP-Zheng MD Cardiologist-Jordan MD Pulmonologist-none   EKG-08/09/23 Echo-03/03/22 Cath-12/26/21 Stress-04/01/22 ICD/PM- ICD/PM Bost GLP1-n/a Blood Thinner-Plavix  5 day hold last dose 8/22  History: CAD, HTN, OSA, DM2, Barretts, Seizure, VT with ICD. Patient last saw his cardiologist on 11/05/23, they did a pharm clearance 03/24/24. He said since last saw cardiologist its paced him out but no shocks, no other issues noted, no chest pains/SOB.   Anesthesia Review- yes- cardiac clearance in chart, okay to proceed

## 2024-04-20 ENCOUNTER — Telehealth: Payer: Self-pay

## 2024-04-20 ENCOUNTER — Telehealth: Payer: Self-pay | Admitting: Gastroenterology

## 2024-04-20 DIAGNOSIS — K920 Hematemesis: Secondary | ICD-10-CM

## 2024-04-20 MED ORDER — ONDANSETRON 4 MG PO TBDP: 4.0000 mg | ORAL_TABLET | Freq: Three times a day (TID) | ORAL | 0 refills | Status: AC | PRN

## 2024-04-20 NOTE — Telephone Encounter (Signed)
 Spoke with scheduling. Patient had a time change for his colonoscopy procedure. New start time of 09:30 AM. With a new arrival time of 08:00 AM. Attempted to call patient to update him of new arrival time. No answer, personalized vm. Left message for patient to return call. My chart message also sent with details.

## 2024-04-20 NOTE — Telephone Encounter (Addendum)
 Procedure:Colonoscopy Procedure date: 04/28/24 Procedure location: [facility] Arrival Time: 9:45 am Spoke with the patient Y/N: Yes Any prep concerns? No Has the patient obtained the prep from the pharmacy ? Yes Do you have a care partner and transportation: Yes Any additional concerns? No

## 2024-04-20 NOTE — Telephone Encounter (Signed)
 Patient calls nurse line in regards to Zofran  prescription.   He reports difficulty picking this up.   I called the pharmacy and Lennie is not enrolled in Medicaid.   Will forward to morning preceptor to resend.

## 2024-04-20 NOTE — Telephone Encounter (Signed)
Rx to pharmacy. [place] Flett, MD  Family Medicine Teaching Service   

## 2024-04-21 ENCOUNTER — Ambulatory Visit (INDEPENDENT_AMBULATORY_CARE_PROVIDER_SITE_OTHER): Admitting: Audiology

## 2024-04-21 ENCOUNTER — Institutional Professional Consult (permissible substitution) (INDEPENDENT_AMBULATORY_CARE_PROVIDER_SITE_OTHER): Admitting: Physician Assistant

## 2024-04-21 NOTE — Telephone Encounter (Signed)
 Called and spoke with patient regarding new arrival time. Discussed time change for morning prep as well. Patient verbalized understanding and repeated times back.

## 2024-04-22 ENCOUNTER — Emergency Department (HOSPITAL_BASED_OUTPATIENT_CLINIC_OR_DEPARTMENT_OTHER)
Admission: EM | Admit: 2024-04-22 | Discharge: 2024-04-22 | Disposition: A | Discharge status: Home / Self Care | Attending: Emergency Medicine | Admitting: Emergency Medicine

## 2024-04-22 ENCOUNTER — Encounter (HOSPITAL_BASED_OUTPATIENT_CLINIC_OR_DEPARTMENT_OTHER): Payer: Self-pay | Admitting: Emergency Medicine

## 2024-04-22 ENCOUNTER — Other Ambulatory Visit: Payer: Self-pay

## 2024-04-22 DIAGNOSIS — Z7982 Long term (current) use of aspirin: Secondary | ICD-10-CM | POA: Diagnosis not present

## 2024-04-22 DIAGNOSIS — Z7984 Long term (current) use of oral hypoglycemic drugs: Secondary | ICD-10-CM | POA: Diagnosis not present

## 2024-04-22 DIAGNOSIS — E119 Type 2 diabetes mellitus without complications: Secondary | ICD-10-CM | POA: Diagnosis not present

## 2024-04-22 DIAGNOSIS — Z79899 Other long term (current) drug therapy: Secondary | ICD-10-CM | POA: Insufficient documentation

## 2024-04-22 DIAGNOSIS — I10 Essential (primary) hypertension: Secondary | ICD-10-CM | POA: Insufficient documentation

## 2024-04-22 DIAGNOSIS — I2510 Atherosclerotic heart disease of native coronary artery without angina pectoris: Secondary | ICD-10-CM | POA: Insufficient documentation

## 2024-04-22 DIAGNOSIS — N3000 Acute cystitis without hematuria: Secondary | ICD-10-CM | POA: Diagnosis not present

## 2024-04-22 DIAGNOSIS — Z7902 Long term (current) use of antithrombotics/antiplatelets: Secondary | ICD-10-CM | POA: Insufficient documentation

## 2024-04-22 DIAGNOSIS — R82998 Other abnormal findings in urine: Secondary | ICD-10-CM | POA: Diagnosis present

## 2024-04-22 LAB — URINALYSIS, ROUTINE W REFLEX MICROSCOPIC
Bilirubin Urine: NEGATIVE
Glucose, UA: NEGATIVE mg/dL
Nitrite: POSITIVE — AB
Specific Gravity, Urine: 1.024 (ref 1.005–1.030)
WBC, UA: >50 WBC/hpf (ref 0–5)
pH: 5.5 (ref 5.0–8.0)

## 2024-04-22 LAB — BASIC METABOLIC PANEL WITH GFR
Anion gap: 13 (ref 5–15)
BUN: 16 mg/dL (ref 6–20)
CO2: 22 mmol/L (ref 22–32)
Calcium: 9.5 mg/dL (ref 8.9–10.3)
Chloride: 103 mmol/L (ref 98–111)
Creatinine, Ser: 1.03 mg/dL (ref 0.61–1.24)
GFR, Estimated: >60 mL/min (ref >60)
Glucose, Bld: 210 mg/dL — ABNORMAL HIGH (ref 70–99)
Potassium: 3.7 mmol/L (ref 3.5–5.1)
Sodium: 137 mmol/L (ref 135–145)

## 2024-04-22 LAB — CBC
HCT: 42.2 % (ref 39.0–52.0)
Hemoglobin: 14.8 g/dL (ref 13.0–17.0)
MCH: 31 pg (ref 26.0–34.0)
MCHC: 35.1 g/dL (ref 30.0–36.0)
MCV: 88.3 fL (ref 80.0–100.0)
Platelets: 195 K/uL (ref 150–400)
RBC: 4.78 MIL/uL (ref 4.22–5.81)
RDW: 13.1 % (ref 11.5–15.5)
WBC: 10.1 K/uL (ref 4.0–10.5)
nRBC: 0 % (ref 0.0–0.2)

## 2024-04-22 MED ORDER — CEPHALEXIN 250 MG PO CAPS
500.0000 mg | ORAL_CAPSULE | Freq: Once | ORAL | Status: AC
  Administered 2024-04-22: 500 mg via ORAL
  Filled 2024-04-22: qty 2

## 2024-04-22 MED ORDER — CEPHALEXIN 500 MG PO CAPS: 500.0000 mg | ORAL_CAPSULE | Freq: Three times a day (TID) | ORAL | 0 refills | Status: DC

## 2024-04-22 NOTE — ED Triage Notes (Signed)
 Reports dark urine today- cola color. No painful urination. Lower back pain several months- seems to be chronic. Denies CP SOB.
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
## 2024-04-22 NOTE — Discharge Instructions (Signed)
 You were seen today and found to have a urinary tract infection.  If you develop fevers or lateralizing back pain, you should be reevaluated.  Take antibiotics as prescribed.

## 2024-04-25 LAB — URINE CULTURE: Culture: >=100000 — AB

## 2024-04-26 ENCOUNTER — Telehealth: Payer: Self-pay | Admitting: Cardiology

## 2024-04-26 NOTE — Telephone Encounter (Signed)
 Pt would like a c/b regarding a reading from his home heart monitor. Please advise.

## 2024-04-26 NOTE — Telephone Encounter (Signed)
 Called patient back to discuss most recent home remote transmission  This RN noted 3 patient initiated interrogations (PII) since 04/18/24  Patient says he hasn't initiated any transmissions himself and believes his house cat may be pushing the button on his bedside monitor while he is sleeping at night or taking a nap during the day

## 2024-04-28 ENCOUNTER — Ambulatory Visit (HOSPITAL_COMMUNITY)
Admission: RE | Admit: 2024-04-28 | Discharge: 2024-04-28 | Disposition: A | Discharge status: Home / Self Care | Attending: Gastroenterology | Admitting: Gastroenterology

## 2024-04-28 ENCOUNTER — Other Ambulatory Visit: Payer: Self-pay

## 2024-04-28 ENCOUNTER — Encounter (HOSPITAL_COMMUNITY)
Admission: RE | Disposition: A | Discharge status: Home / Self Care | Payer: Self-pay | Source: Home / Self Care | Attending: Gastroenterology

## 2024-04-28 ENCOUNTER — Ambulatory Visit (HOSPITAL_COMMUNITY): Admitting: Anesthesiology

## 2024-04-28 ENCOUNTER — Encounter (HOSPITAL_COMMUNITY): Payer: Self-pay | Admitting: Gastroenterology

## 2024-04-28 DIAGNOSIS — K219 Gastro-esophageal reflux disease without esophagitis: Secondary | ICD-10-CM | POA: Insufficient documentation

## 2024-04-28 DIAGNOSIS — K648 Other hemorrhoids: Secondary | ICD-10-CM | POA: Insufficient documentation

## 2024-04-28 DIAGNOSIS — Z7902 Long term (current) use of antithrombotics/antiplatelets: Secondary | ICD-10-CM | POA: Diagnosis not present

## 2024-04-28 DIAGNOSIS — Z950 Presence of cardiac pacemaker: Secondary | ICD-10-CM | POA: Diagnosis not present

## 2024-04-28 DIAGNOSIS — Z681 Body mass index (BMI) 19 or less, adult: Secondary | ICD-10-CM | POA: Diagnosis not present

## 2024-04-28 DIAGNOSIS — E119 Type 2 diabetes mellitus without complications: Secondary | ICD-10-CM | POA: Diagnosis not present

## 2024-04-28 DIAGNOSIS — R6881 Early satiety: Secondary | ICD-10-CM | POA: Diagnosis not present

## 2024-04-28 DIAGNOSIS — F419 Anxiety disorder, unspecified: Secondary | ICD-10-CM | POA: Insufficient documentation

## 2024-04-28 DIAGNOSIS — K635 Polyp of colon: Secondary | ICD-10-CM | POA: Diagnosis not present

## 2024-04-28 DIAGNOSIS — D122 Benign neoplasm of ascending colon: Secondary | ICD-10-CM | POA: Insufficient documentation

## 2024-04-28 DIAGNOSIS — I252 Old myocardial infarction: Secondary | ICD-10-CM | POA: Insufficient documentation

## 2024-04-28 DIAGNOSIS — R519 Headache, unspecified: Secondary | ICD-10-CM | POA: Diagnosis not present

## 2024-04-28 DIAGNOSIS — D125 Benign neoplasm of sigmoid colon: Secondary | ICD-10-CM | POA: Diagnosis not present

## 2024-04-28 DIAGNOSIS — K449 Diaphragmatic hernia without obstruction or gangrene: Secondary | ICD-10-CM | POA: Diagnosis not present

## 2024-04-28 DIAGNOSIS — K649 Unspecified hemorrhoids: Secondary | ICD-10-CM | POA: Diagnosis not present

## 2024-04-28 DIAGNOSIS — F32A Depression, unspecified: Secondary | ICD-10-CM | POA: Insufficient documentation

## 2024-04-28 DIAGNOSIS — D128 Benign neoplasm of rectum: Secondary | ICD-10-CM | POA: Insufficient documentation

## 2024-04-28 DIAGNOSIS — I2510 Atherosclerotic heart disease of native coronary artery without angina pectoris: Secondary | ICD-10-CM | POA: Diagnosis not present

## 2024-04-28 DIAGNOSIS — I10 Essential (primary) hypertension: Secondary | ICD-10-CM

## 2024-04-28 DIAGNOSIS — D123 Benign neoplasm of transverse colon: Secondary | ICD-10-CM | POA: Diagnosis not present

## 2024-04-28 DIAGNOSIS — I110 Hypertensive heart disease with heart failure: Secondary | ICD-10-CM | POA: Diagnosis not present

## 2024-04-28 DIAGNOSIS — D759 Disease of blood and blood-forming organs, unspecified: Secondary | ICD-10-CM | POA: Diagnosis not present

## 2024-04-28 DIAGNOSIS — Z7984 Long term (current) use of oral hypoglycemic drugs: Secondary | ICD-10-CM | POA: Insufficient documentation

## 2024-04-28 DIAGNOSIS — R109 Unspecified abdominal pain: Secondary | ICD-10-CM | POA: Diagnosis present

## 2024-04-28 DIAGNOSIS — R634 Abnormal weight loss: Secondary | ICD-10-CM | POA: Diagnosis present

## 2024-04-28 DIAGNOSIS — G4730 Sleep apnea, unspecified: Secondary | ICD-10-CM | POA: Insufficient documentation

## 2024-04-28 DIAGNOSIS — R569 Unspecified convulsions: Secondary | ICD-10-CM | POA: Insufficient documentation

## 2024-04-28 DIAGNOSIS — R1010 Upper abdominal pain, unspecified: Secondary | ICD-10-CM | POA: Diagnosis present

## 2024-04-28 DIAGNOSIS — I509 Heart failure, unspecified: Secondary | ICD-10-CM | POA: Diagnosis not present

## 2024-04-28 HISTORY — PX: COLONOSCOPY: SHX5424

## 2024-04-28 LAB — GLUCOSE, CAPILLARY: Glucose-Capillary: 133 mg/dL — ABNORMAL HIGH (ref 70–99)

## 2024-04-28 SURGERY — COLONOSCOPY
Anesthesia: Monitor Anesthesia Care

## 2024-04-28 MED ORDER — PROPOFOL 500 MG/50ML IV EMUL
INTRAVENOUS | Status: DC | PRN
  Administered 2024-04-28: 200 ug/kg/min via INTRAVENOUS
  Administered 2024-04-28: 20 mg via INTRAVENOUS
  Administered 2024-04-28: 30 mg via INTRAVENOUS
  Administered 2024-04-28 (×2): 20 mg via INTRAVENOUS

## 2024-04-28 MED ORDER — LIDOCAINE 2% (20 MG/ML) 5 ML SYRINGE
INTRAMUSCULAR | Status: DC | PRN
  Administered 2024-04-28: 60 mg via INTRAVENOUS

## 2024-04-28 MED ORDER — PHENYLEPHRINE 80 MCG/ML (10ML) SYRINGE FOR IV PUSH (FOR BLOOD PRESSURE SUPPORT)
PREFILLED_SYRINGE | INTRAVENOUS | Status: DC | PRN
  Administered 2024-04-28: 160 ug via INTRAVENOUS

## 2024-04-28 MED ORDER — SODIUM CHLORIDE 0.9 % IV SOLN: INTRAVENOUS | Status: DC | PRN

## 2024-04-28 MED ORDER — PROPOFOL 10 MG/ML IV BOLUS
INTRAVENOUS | Status: AC
  Filled 2024-04-28: qty 20

## 2024-04-28 NOTE — Transfer of Care (Signed)
 Immediate Anesthesia Transfer of Care Note  Patient: Andrew Huerta  Procedure(s) Performed: COLONOSCOPY  Patient Location: Endoscopy Unit  Anesthesia Type:MAC  Level of Consciousness: sedated  Airway & Oxygen Therapy: Patient Spontanous Breathing  Post-op Assessment: Report given to RN and Post -op Vital signs reviewed and stable  Post vital signs: Reviewed and stable  Last Vitals:  Vitals Value Taken Time  BP 103/58 04/28/24 11:24  Temp    Pulse 96 04/28/24 11:25  Resp 20 04/28/24 11:25  SpO2 97 % 04/28/24 11:25  Vitals shown include unfiled device data.  Last Pain:  Vitals:   04/28/24 0843  TempSrc: Temporal  PainSc: 0-No pain         Complications: No notable events documented.

## 2024-04-28 NOTE — Discharge Instructions (Signed)

## 2024-04-28 NOTE — Anesthesia Preprocedure Evaluation (Addendum)
 Anesthesia Evaluation  Patient identified by MRN, date of birth, ID band Patient awake    Reviewed: Allergy & Precautions, NPO status , Patient's Chart, lab work & pertinent test results  Airway Mallampati: II       Dental  (+) Edentulous Upper, Edentulous Lower   Pulmonary sleep apnea    Pulmonary exam normal        Cardiovascular hypertension, Pt. on medications + CAD, + Past MI, + Cardiac Stents (x 4) and +CHF  Normal cardiovascular exam+ pacemaker + Cardiac Defibrillator      Neuro/Psych  Headaches, Seizures -,  PSYCHIATRIC DISORDERS Anxiety Depression     Neuromuscular disease    GI/Hepatic hiatal hernia,GERD  Medicated and Controlled,,  Endo/Other  diabetes, Oral Hypoglycemic Agents    Renal/GU      Musculoskeletal   Abdominal   Peds  Hematology  (+) Blood dyscrasia (Plavix )   Anesthesia Other Findings Abdominal Pain Weight loss     Reproductive/Obstetrics                              Anesthesia Physical Anesthesia Plan  ASA: 3  Anesthesia Plan: MAC   Post-op Pain Management:    Induction:   PONV Risk Score and Plan: 1 and Propofol  infusion and Treatment may vary due to age or medical condition  Airway Management Planned: Nasal Cannula  Additional Equipment:   Intra-op Plan:   Post-operative Plan:   Informed Consent: I have reviewed the patients History and Physical, chart, labs and discussed the procedure including the risks, benefits and alternatives for the proposed anesthesia with the patient or authorized representative who has indicated his/her understanding and acceptance.       Plan Discussed with: CRNA  Anesthesia Plan Comments:         Anesthesia Quick Evaluation

## 2024-04-28 NOTE — Anesthesia Procedure Notes (Signed)
 Procedure Name: MAC Date/Time: 04/28/2024 10:36 AM  Performed by: Franchot Delon RAMAN, CRNAPre-anesthesia Checklist: Patient identified, Emergency Drugs available, Suction available and Patient being monitored Oxygen Delivery Method: Simple face mask Placement Confirmation: positive ETCO2 Dental Injury: Teeth and Oropharynx as per pre-operative assessment

## 2024-04-28 NOTE — Op Note (Signed)
 [facility] Patient Name: Andrew Huerta Procedure Date: 04/28/2024 MRN: [id number] Attending MD: Victory CROME. Legrand , MD, [id number] Date of Birth: 07/01/1966 CSN: [id number] Age: 58 Admit Type: Inpatient Procedure:                Colonoscopy Indications:              Upper abdominal pain (more early satiety and                            bolating), Weight loss                           No source on EGD and H pylori negative; CTAP with                            gallstones felt to be incidental; Nml Gastric                            emptying study Providers:                Victory CROME. Legrand, MD, Willy Hummer, RN, Felice Sar,                            Technician Referring MD:             Twyla Nearing Medicines:                Monitored Anesthesia Care Complications:            No immediate complications. Estimated Blood Loss:     Estimated blood loss was minimal. Procedure:                Pre-Anesthesia Assessment:                           - Prior to the procedure, a History and Physical                            was performed, and patient medications and                            allergies were reviewed. The patient's tolerance of                            previous anesthesia was also reviewed. The risks                            and benefits of the procedure and the sedation                            options and risks were discussed with the patient.                            All questions were answered, and informed consent                            was  obtained. Prior Anticoagulants: The patient has                            taken Plavix  (clopidogrel ), last dose was 5 days                            prior to procedure. ASA Grade Assessment: IV - A                            patient with severe systemic disease that is a                            constant threat to life. After reviewing the risks                            and benefits, the patient was deemed  in                            satisfactory condition to undergo the procedure.                           After obtaining informed consent, the colonoscope                            was passed under direct vision. Throughout the                            procedure, the patient's blood pressure, pulse, and                            oxygen saturations were monitored continuously. The                            CF-HQ190L ([id number] Olympus colonoscope was                            introduced through the anus and advanced to the the                            cecum, identified by appendiceal orifice and                            ileocecal valve. The colonoscopy was somewhat                            difficult due to colon spasm and mild sigmoid colon                            redundancy. Successful completion of the procedure                            was aided by straightening and shortening the scope  to obtain bowel loop reduction and lavage. The                            patient tolerated the procedure well. The quality                            of the bowel preparation was good after lavage                            (patient reported vomiting Suprep). The ileocecal                            valve, appendiceal orifice, and rectum were                            photographed. Scope In: 10:49:59 AM Scope Out: 11:17:59 AM Scope Withdrawal Time: 0 hours 23 minutes 39 seconds  Total Procedure Duration: 0 hours 28 minutes 0 seconds  Findings:      The perianal and digital rectal examinations were normal.      Repeat examination of right colon under NBI performed.      Five sessile polyps were found in the rectum, sigmoid colon, transverse       colon and proximal ascending colon. The polyps were 3 to 8 mm in size.       These polyps were removed with a cold snare. Resection and retrieval       were complete.      Internal hemorrhoids were found. The  hemorrhoids were small.      The exam was otherwise without abnormality on direct and retroflexion       views. Impression:               - Five 3 to 8 mm polyps in the rectum, in the                            sigmoid colon, in the transverse colon and in the                            proximal ascending colon, removed with a cold                            snare. Resected and retrieved.                           - Internal hemorrhoids.                           - The examination was otherwise normal on direct                            and retroflexion views. Moderate Sedation:      MAC sedation used Recommendation:           - Patient has a contact number available for  emergencies. The signs and symptoms of potential                            delayed complications were discussed with the                            patient. Return to normal activities tomorrow.                            Written discharge instructions were provided to the                            patient.                           - Resume previous diet.                           - Continue present medications.                           - Await pathology results.                           - Repeat colonoscopy is recommended for                            surveillance. The colonoscopy date will be                            determined after pathology results from today's                            exam become available for review. Patient needs                            anti-emetics with bowel prep for next colonoscopy.                           - Resume Plavix  (clopidogrel ) at prior dose                            tomorrow.                           - Return to primary care physician. This patient                            reports symptom onset months ago coincident with                            multiple psychosocial stressors that could be                            contributing to  his symptoms. Procedure Code(s):        --- Professional ---  54614, Colonoscopy, flexible; with removal of                            tumor(s), polyp(s), or other lesion(s) by snare                            technique Diagnosis Code(s):        --- Professional ---                           D12.8, Benign neoplasm of rectum                           D12.5, Benign neoplasm of sigmoid colon                           D12.3, Benign neoplasm of transverse colon (hepatic                            flexure or splenic flexure)                           D12.2, Benign neoplasm of ascending colon                           K64.8, Other hemorrhoids                           R10.10, Upper abdominal pain, unspecified                           R63.4, Abnormal weight loss CPT copyright 2022 [employer]. All rights reserved. The codes documented in this report are preliminary and upon coder review may  be revised to meet current compliance requirements. Porter Moes L. Legrand, MD 04/28/2024 11:32:27 AM This report has been signed electronically. Number of Addenda: 0
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
## 2024-04-28 NOTE — Interval H&P Note (Signed)
 History and Physical Interval Note:  04/28/2024 10:35 AM  Andrew Huerta  has presented today for surgery, with the diagnosis of Abdominal Pain, weight loss.  The various methods of treatment have been discussed with the patient and family. After consideration of risks, benefits and other options for treatment, the patient has consented to  Procedure(s): COLONOSCOPY (N/A) as a surgical intervention.  The patient's history has been reviewed, patient examined, no change in status, stable for surgery.  I have reviewed the patient's chart and labs.  Questions were answered to the patient's satisfaction.     [name]

## 2024-04-29 ENCOUNTER — Encounter (HOSPITAL_COMMUNITY): Payer: Self-pay | Admitting: Gastroenterology

## 2024-04-29 ENCOUNTER — Ambulatory Visit: Payer: Self-pay | Admitting: Gastroenterology

## 2024-04-29 LAB — SURGICAL PATHOLOGY

## 2024-04-29 NOTE — Anesthesia Postprocedure Evaluation (Signed)
 Anesthesia Post Note  Patient: Andrew Huerta  Procedure(s) Performed: COLONOSCOPY     Patient location during evaluation: Endoscopy Anesthesia Type: MAC Level of consciousness: awake Pain management: pain level controlled Vital Signs Assessment: post-procedure vital signs reviewed and stable Respiratory status: spontaneous breathing, nonlabored ventilation and respiratory function stable Cardiovascular status: blood pressure returned to baseline and stable Postop Assessment: no apparent nausea or vomiting Anesthetic complications: no   No notable events documented.  Last Vitals:  Vitals:   04/28/24 1140 04/28/24 1150  BP: 119/64 136/68  Pulse: 71 82  Resp: (!) 21 19  Temp:    SpO2: 100% 99%    Last Pain:  Vitals:   04/28/24 1150  TempSrc:   PainSc: 0-No pain                 Andrew Huerta P Shaundra Fullam

## 2024-04-29 NOTE — Telephone Encounter (Signed)
 Letter mailed and recall placed.

## 2024-05-04 ENCOUNTER — Other Ambulatory Visit: Payer: Self-pay

## 2024-05-04 DIAGNOSIS — G894 Chronic pain syndrome: Secondary | ICD-10-CM

## 2024-05-06 MED ORDER — HYDROCODONE-ACETAMINOPHEN 5-325 MG PO TABS: 1.0000 | ORAL_TABLET | Freq: Three times a day (TID) | ORAL | 0 refills | Status: DC | PRN

## 2024-05-11 ENCOUNTER — Encounter: Payer: Self-pay | Admitting: Gastroenterology

## 2024-05-11 ENCOUNTER — Ambulatory Visit: Admitting: Gastroenterology

## 2024-05-11 ENCOUNTER — Ambulatory Visit: Admitting: Family Medicine

## 2024-05-11 VITALS — BP 114/74 | HR 98 | Ht 67.0 in | Wt 128.6 lb

## 2024-05-11 VITALS — BP 100/70 | HR 103 | Ht 67.0 in | Wt 129.0 lb

## 2024-05-11 DIAGNOSIS — F332 Major depressive disorder, recurrent severe without psychotic features: Secondary | ICD-10-CM

## 2024-05-11 DIAGNOSIS — E1169 Type 2 diabetes mellitus with other specified complication: Secondary | ICD-10-CM | POA: Diagnosis not present

## 2024-05-11 DIAGNOSIS — K22711 Barrett's esophagus with high grade dysplasia: Secondary | ICD-10-CM | POA: Diagnosis not present

## 2024-05-11 DIAGNOSIS — Z7902 Long term (current) use of antithrombotics/antiplatelets: Secondary | ICD-10-CM | POA: Diagnosis not present

## 2024-05-11 DIAGNOSIS — K2270 Barrett's esophagus without dysplasia: Secondary | ICD-10-CM

## 2024-05-11 DIAGNOSIS — G8929 Other chronic pain: Secondary | ICD-10-CM

## 2024-05-11 DIAGNOSIS — R634 Abnormal weight loss: Secondary | ICD-10-CM | POA: Diagnosis not present

## 2024-05-11 DIAGNOSIS — R109 Unspecified abdominal pain: Secondary | ICD-10-CM

## 2024-05-11 LAB — POCT GLYCOSYLATED HEMOGLOBIN (HGB A1C): HbA1c, POC (controlled diabetic range): 6.7 % (ref 0.0–7.0)

## 2024-05-11 MED ORDER — ESCITALOPRAM OXALATE 10 MG PO TABS: 10.0000 mg | ORAL_TABLET | Freq: Every day | ORAL | 1 refills | Status: DC

## 2024-05-11 NOTE — Patient Instructions (Addendum)
 Good to see you today - Thank you for coming in  Things we discussed today:  1) Start taking Lexapro  10mg  daily for depression and stress - You can start with just half a tablet for a week, and increase to 1 full tablet if you are tolerating it.  - You may not notice this medicine working until 4-6 weeks of taking it  2) We will see what your GI doctor recommends for your esophagus issues.    3) Come back to see me in 4-6 weeks  4) Great job on your diabetes! Your A1c is 6.7. Your weight is also improving, which is great news!

## 2024-05-11 NOTE — Progress Notes (Signed)
    SUBJECTIVE:   CHIEF COMPLAINT / HPI:   Andrew Huerta is a 58yo M that pf weight loss fu. - He feels like some days, his appetite is getting better - His weight is increasing - He is going to see GI today about his esophagus issues. He had a EGD that showed Barrets esophagus. Colonoscopy removed 5 polyps, neg for high grade dysplasia.   - Stopped metformin  because it was affecting his appetite.  - He also stopped taking zoloft  due to feeling groggy when taking it. He tried taking it at night but it didn't help.  - Denies SI  PERTINENT  PMH / PSH: T2DM, MDD, CAD, HLD, Barrets Esophagus  OBJECTIVE:   BP 114/74   Pulse 98   Ht 5' 7 (1.702 m)   Wt 128 lb 9.6 oz (58.3 kg)   SpO2 97%   BMI 20.14 kg/m   General: Alert, pleasant man. NAD. Psych: Tearful at time during interview. Denies SI.  HEENT: NCAT. MMM. CV: RRR, no murmurs.   Resp: CTAB, no wheezing or crackles. Normal WOB on RA.   Ext: Moves all ext spontaneously Skin: Warm, well perfused   ASSESSMENT/PLAN:   Assessment & Plan Type 2 diabetes mellitus with other specified complication, unspecified whether long term insulin  use (HCC) A1c 6.7, at goal. Currently diet controlled.  - Stop metformin  due to GI side effects - UACR at f/u Severe episode of recurrent major depressive disorder, without psychotic features (HCC) Uncontrolled. Previously unable to tolerate duloxetine , zoloft , venlafaxine . - Start lexapro  10mg  daily.  - Advised to start with 5mg  daily for 7 days and increase to 10mg   - f/u in 4wks. Consider psychiatry consult if not tolerating lexapro .  Weight loss Wt stable. Unclear etiology but potentially a combination of uncontrolled mood symptoms, barrets and uncontrolled GERD. Colonoscopy and EGD neg for malignancy. Lab work notable for positive RPR, which was thought to be false positive.  - treat MDD as above - f/u in 4 wks to check weight - Repeat RPR at f/u   Twyla Nearing, MD [facility]
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
## 2024-05-11 NOTE — Patient Instructions (Signed)
 You have been scheduled for an endoscopy. Please follow written instructions given to you at your visit today.  If you use inhalers (even only as needed), please bring them with you on the day of your procedure.  If you take any of the following medications, they will need to be adjusted prior to your procedure:   DO NOT TAKE 7 DAYS PRIOR TO TEST- Trulicity (dulaglutide) Ozempic , Wegovy (semaglutide ) Mounjaro (tirzepatide) Bydureon Bcise (exanatide extended release)  DO NOT TAKE 1 DAY PRIOR TO YOUR TEST Rybelsus (semaglutide ) Adlyxin (lixisenatide) Victoza (liraglutide) Byetta (exanatide) ___________________________________________________________________________  Due to recent changes in healthcare laws, you may see the results of your imaging and laboratory studies on MyChart before your provider has had a chance to review them.  We understand that in some cases there may be results that are confusing or concerning to you. Not all laboratory results come back in the same time frame and the provider may be waiting for multiple results in order to interpret others.  Please give us  48 hours in order for your provider to thoroughly review all the results before contacting the office for clarification of your results.   _______________________________________________________  If your blood pressure at your visit was 140/90 or greater, please contact your primary care physician to follow up on this.  _______________________________________________________  If you are age 65 or older, your body mass index should be between 23-30. Your Body mass index is 20.2 kg/m. If this is out of the aforementioned range listed, please consider follow up with your Primary Care Provider.  If you are age 76 or younger, your body mass index should be between 19-25. Your Body mass index is 20.2 kg/m. If this is out of the aformentioned range listed, please consider follow up with your Primary Care Provider.    ________________________________________________________  The [facility] providers would like to encourage you to use MYCHART to communicate with providers for non-urgent requests or questions.  Due to long hold times on the telephone, sending your provider a message by [facility] may be a faster and more efficient way to get a response.  Please allow 48 business hours for a response.  Please remember that this is for non-urgent requests.  _______________________________________________________  [facility] is using a team-based approach to care.  Your team is made up of your doctor and two to three APPS. Our APPS (Nurse Practitioners and Physician Assistants) work with your physician to ensure care continuity for you. They are fully qualified to address your health concerns and develop a treatment plan. They communicate directly with your gastroenterologist to care for you. Seeing the Advanced Practice Practitioners on your physician's team can help you by facilitating care more promptly, often allowing for earlier appointments, access to diagnostic testing, procedures, and other specialty referrals.   Thank you for choosing me and [facility].  Dr. Wilhelmenia

## 2024-05-12 ENCOUNTER — Telehealth: Payer: Self-pay

## 2024-05-12 NOTE — Telephone Encounter (Signed)
 Request for surgical clearance:     Endoscopy Procedure  What type of surgery is being performed?     EGD with RFA  When is this surgery scheduled?     06/29/24  What type of clearance is required ?   Pharmacy  Are there any medications that need to be held prior to surgery and how long? Plavix  x5 days prior to procedure.  Practice name and name of physician performing surgery?      [facility]  What is your office phone and fax number?      Phone- [phone]  Fax- [phone]  Anesthesia type (None, local, MAC, general) ?       MAC  Please route your response to Blondie Barks, CMA

## 2024-05-13 NOTE — Telephone Encounter (Signed)
   Patient Name: Andrew Huerta  DOB: March 17, 1966 MRN: [id number]  Primary Cardiologist: Peter Swaziland, MD  Chart reviewed as part of pre-operative protocol coverage. Pre-op clearance already addressed by colleagues in earlier phone notes. To summarize recommendations:  -Yes ok to hold Plavix  for 5 days for planned procedure   Peter Swaziland MD, [facility]  No medical clearance was requested but the patient was seen back in March and was doing well at that visit.  Will route this bundled recommendation to requesting provider via Epic fax function and remove from pre-op pool. Please call with questions.  Orren LOISE Fabry, PA-C 05/13/2024, 9:19 AM

## 2024-05-13 NOTE — Assessment & Plan Note (Signed)
 Uncontrolled. Previously unable to tolerate duloxetine , zoloft , venlafaxine . - Start lexapro  10mg  daily.  - Advised to start with 5mg  daily for 7 days and increase to 10mg   - f/u in 4wks. Consider psychiatry consult if not tolerating lexapro .

## 2024-05-14 ENCOUNTER — Encounter: Payer: Self-pay | Admitting: Gastroenterology

## 2024-05-14 DIAGNOSIS — Z7902 Long term (current) use of antithrombotics/antiplatelets: Secondary | ICD-10-CM | POA: Insufficient documentation

## 2024-05-14 DIAGNOSIS — G8929 Other chronic pain: Secondary | ICD-10-CM | POA: Insufficient documentation

## 2024-05-14 DIAGNOSIS — K22711 Barrett's esophagus with high grade dysplasia: Secondary | ICD-10-CM | POA: Insufficient documentation

## 2024-05-16 NOTE — Telephone Encounter (Signed)
 Patient informed okay to hold Plavix  x 5 days prior to procedure per Cardiology. Patient voiced understanding.

## 2024-05-22 ENCOUNTER — Encounter: Payer: Self-pay | Admitting: Family Medicine

## 2024-05-31 ENCOUNTER — Ambulatory Visit (INDEPENDENT_AMBULATORY_CARE_PROVIDER_SITE_OTHER): Admitting: Audiology

## 2024-05-31 ENCOUNTER — Institutional Professional Consult (permissible substitution) (INDEPENDENT_AMBULATORY_CARE_PROVIDER_SITE_OTHER): Admitting: Physician Assistant

## 2024-06-01 ENCOUNTER — Ambulatory Visit: Admitting: Family Medicine

## 2024-06-01 ENCOUNTER — Encounter: Payer: Self-pay | Admitting: Family Medicine

## 2024-06-01 VITALS — BP 138/92 | HR 84 | Ht 67.0 in | Wt 128.2 lb

## 2024-06-01 DIAGNOSIS — R42 Dizziness and giddiness: Secondary | ICD-10-CM | POA: Diagnosis not present

## 2024-06-01 DIAGNOSIS — M25532 Pain in left wrist: Secondary | ICD-10-CM | POA: Diagnosis present

## 2024-06-01 DIAGNOSIS — E1169 Type 2 diabetes mellitus with other specified complication: Secondary | ICD-10-CM

## 2024-06-01 NOTE — Patient Instructions (Signed)
 Good to see you today - Thank you for coming in  Things we discussed today:  1) We will get an Xray of your left wrist to check your wrist bump. This could be a ganglion cyst, although we want to make sure it's not something more dangerous. - If it starts to grow, get inflamed or changes, take a picture on your phone so we can keep track of it.   2) I recommend wearing compression stockings. These squeeze your legs and help with blood circulation.  - When standing up, take a moment to prevent dizziness.    Come back to see me in 1 month.

## 2024-06-01 NOTE — Progress Notes (Signed)
    SUBJECTIVE:   CHIEF COMPLAINT / HPI:   AK is a 58yo M that pf L wrist pain - Reports his L elbow and arm started to feel tight - Noticed a new knot on his L wrist - Reports BL fingers feelign numb - This has been ongoing for a few weeks.  - No fevers or rash - No preceding trauma  - Also has hx of BL carpall tunnel, he had a surgical release on R hand.   - Reports he was lifting something at work yesterday, stood up too quickly and it caused him to gyt dizzy and he hit his elbow. No LOC or head injury.   PERTINENT  PMH / PSH: carpal tunnel, T2DM, HTN  OBJECTIVE:   BP (!) 143/94   Pulse 84   Ht 5' 7 (1.702 m)   Wt 128 lb 3.2 oz (58.2 kg)   SpO2 100%   BMI 20.08 kg/m   General: Alert, pleasant man. NAD. HEENT: NCAT. MMM. CV: RRR, no murmurs.  Resp: CTAB, no wheezing or crackles. Normal WOB on RA.  Abm: Soft, nontender, nondistended. BS present. Ext: Moves all ext spontaneously Skin:Hard, immobile 2mm bump on L medial wrist. No overlying skin changes  ASSESSMENT/PLAN:   Assessment & Plan Left wrist pain Ifferential includes ganglion cyst, reactive bone formation, or potential malignancy such as osetosarcoma. Will obtain imaging for further workup. - Wrist XR - Return precautions discuessed. Advised to take photos and/or notify if lesion changes or grows.  - f/u in 1 month Orthostatic dizziness - Counseled on compression stockings and slowly standing, especially when lifting heavy objects.      Twyla Nearing, MD [facility]

## 2024-06-02 ENCOUNTER — Ambulatory Visit (HOSPITAL_COMMUNITY)
Admission: RE | Admit: 2024-06-02 | Discharge: 2024-06-02 | Disposition: A | Discharge status: Home / Self Care | Source: Ambulatory Visit | Attending: Family Medicine | Admitting: Family Medicine

## 2024-06-02 DIAGNOSIS — M25532 Pain in left wrist: Secondary | ICD-10-CM | POA: Diagnosis present

## 2024-06-03 ENCOUNTER — Other Ambulatory Visit: Payer: Self-pay

## 2024-06-03 DIAGNOSIS — G894 Chronic pain syndrome: Secondary | ICD-10-CM

## 2024-06-06 ENCOUNTER — Encounter: Payer: Self-pay | Admitting: Family Medicine

## 2024-06-06 ENCOUNTER — Telehealth: Payer: Self-pay

## 2024-06-06 ENCOUNTER — Ambulatory Visit: Admitting: Family Medicine

## 2024-06-06 VITALS — BP 125/85 | HR 92 | Ht 67.0 in | Wt 130.0 lb

## 2024-06-06 DIAGNOSIS — K22711 Barrett's esophagus with high grade dysplasia: Secondary | ICD-10-CM

## 2024-06-06 DIAGNOSIS — R319 Hematuria, unspecified: Secondary | ICD-10-CM | POA: Diagnosis not present

## 2024-06-06 DIAGNOSIS — K2951 Unspecified chronic gastritis with bleeding: Secondary | ICD-10-CM | POA: Diagnosis not present

## 2024-06-06 DIAGNOSIS — R1905 Periumbilic swelling, mass or lump: Secondary | ICD-10-CM | POA: Diagnosis not present

## 2024-06-06 DIAGNOSIS — E1169 Type 2 diabetes mellitus with other specified complication: Secondary | ICD-10-CM | POA: Diagnosis present

## 2024-06-06 LAB — POCT URINALYSIS DIP (MANUAL ENTRY)
Glucose, UA: NEGATIVE mg/dL
Nitrite, UA: POSITIVE — AB
Protein Ur, POC: 30 mg/dL — AB
Spec Grav, UA: 1.02 (ref 1.010–1.025)
Urobilinogen, UA: 1 U/dL
pH, UA: 6 (ref 5.0–8.0)

## 2024-06-06 MED ORDER — HYDROCODONE-ACETAMINOPHEN 5-325 MG PO TABS: 1.0000 | ORAL_TABLET | Freq: Three times a day (TID) | ORAL | 0 refills | Status: DC | PRN

## 2024-06-06 NOTE — Telephone Encounter (Signed)
 Patient LVM on nurse line regarding concerns for fluid coming out of belly button.   He reports that he had blood in his urine on Saturday. He states that a few days prior his abdomen was itching. He then noticed a knot above his umbilicus. When he noticed this he also noticed a jelly like fluid coming from umbilicus. He reports that this has since resolved. Denies pain at rest, however, reports pain with palpation of the area.   Advised patient that I would recommend follow up clinic evaluation for this concern.   Scheduled for this afternoon with Dr. Elicia.   Chiquita JAYSON English, RN

## 2024-06-06 NOTE — Patient Instructions (Addendum)
 1) Your urine symptoms are most likely from a kidney stone that has already passed. I am going to send your urine to culture to see if you also have an infection. I will reach out if it comes back positive.  Let me know if you: - have worsening blood in urine - new pain with peeing - penis discharge - flank pain - fevers of 100.15F or higher  2) I am going to order a CT scan to get a better image of the belly button nodule. I am also reaching out to your GI doctor to update him and see if he has any recommendations as well.

## 2024-06-06 NOTE — Progress Notes (Signed)
    SUBJECTIVE:   CHIEF COMPLAINT / HPI:   Andrew Huerta is a 59yo M that pf hematuria   Hematuria  Belly Button Discharge - On Saturday, peed and saw red urine in the toilet. Denied any dysuria at this time. He had another urine later that day that was lighter. After that, his urine returned to normal appearance, no noticeable blood.   - Reports some Left flank pain starting on Sunday.  - No fevers  - Also on Saturday, noticed a jelly clear substance coming from his belly button. - He has been rubbing it to check his belly button and it feels irritated.  - Has hx of kidney stones. He previously reuired surgery for stone removal.   - Is in a monogamous sexual relationship - Denies any new rash or lesions on his genitals.    L Wrist Bump - Reports that his L wrist bump is still very tender and his L shoulder feels sore. 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   PERTINENT  PMH / PSH: CAD, HTN, nephrolithiasis, barretts esophagus, T2DM  OBJECTIVE:   BP 125/85   Pulse 92   Ht 5' 7 (1.702 m)   Wt 130 lb (59 kg)   SpO2 99%   BMI 20.36 kg/m   General: Alert, pleasant man. NAD. HEENT: NCAT. MMM. CV: RRR, no murmurs. Cap refill <2. Resp: CTAB, no wheezing or crackles. Normal WOB on RA.   Ext: Moves all ext spontaneously Skin: Warm, well perfused  ABM: Small, nontender mobile, hard 1cm felt subcutaneously ~1in above umbilicus. Mild erythema in umbilicus, no eryhtema, drainage, or tenderness.  MSK:No flank tenderness.    ASSESSMENT/PLAN:   Assessment & Plan Hematuria, unspecified type Differential includes nephrolithiasis vs UTI. Pt has strong hx of nephrolithiasis (seen on 03/2024 CT). No infectious symptoms such as dysuria, polyuria, fevers, CVA tenderness. UA notable for leuks and nitrites, which is most likely 2/2 stone irritation rather than bacterial infxn. Given improvement in hematuria and no other symptoms, suspect stone has already passed. -  Urine Cx to assess for potential infxn -  Type 2 diabetes mellitus with other specified complication, unspecified whether long term insulin  use (HCC) UACR today Periumbilical mass Differential includes scar tissue, lymph node, and hernia. C/f lymph node given concurrent high grade barretts esophagus, weight loss, and hard nature of lesion. Scar tissue also considered, but will get imaging to further differentiate. Hernia considered although unlikely to cause a small, hard mass. - CT AP to further assess periumbilical lesion - Contacted GI Dr. Wilhelmenia  to see if he has other recommendations for testing/imaging at thsi time.      Twyla Nearing, MD [facility]

## 2024-06-06 NOTE — Progress Notes (Deleted)
    SUBJECTIVE:   CHIEF COMPLAINT / HPI:   ***  AK is a 58yo M that pf hematuria    Hematuria      Belly Button Discharge        L Wrist Bump     PERTINENT  PMH / PSH: ***  OBJECTIVE:   There were no vitals taken for this visit.  ***  ASSESSMENT/PLAN:   Assessment & Plan Type 2 diabetes mellitus with other specified complication, unspecified whether long term insulin  use (HCC)  Hematuria, unspecified type      Andrew Nearing, MD [facility]

## 2024-06-07 ENCOUNTER — Ambulatory Visit: Admitting: Family Medicine

## 2024-06-08 ENCOUNTER — Ambulatory Visit: Payer: Self-pay | Admitting: Family Medicine

## 2024-06-08 DIAGNOSIS — M25532 Pain in left wrist: Secondary | ICD-10-CM

## 2024-06-08 LAB — MICROALBUMIN / CREATININE URINE RATIO
Creatinine, Urine: 243.9 mg/dL
Microalb/Creat Ratio: 29 mg/g{creat} (ref 0–29)
Microalbumin, Urine: 71.5 ug/mL

## 2024-06-09 ENCOUNTER — Ambulatory Visit (HOSPITAL_BASED_OUTPATIENT_CLINIC_OR_DEPARTMENT_OTHER)
Admission: RE | Admit: 2024-06-09 | Discharge: 2024-06-09 | Disposition: A | Discharge status: Home / Self Care | Source: Ambulatory Visit | Attending: Family Medicine | Admitting: Family Medicine

## 2024-06-09 ENCOUNTER — Telehealth: Payer: Self-pay

## 2024-06-09 DIAGNOSIS — R1905 Periumbilic swelling, mass or lump: Secondary | ICD-10-CM | POA: Diagnosis present

## 2024-06-09 MED ORDER — IOHEXOL 300 MG/ML  SOLN
100.0000 mL | Freq: Once | INTRAMUSCULAR | Status: AC | PRN
  Administered 2024-06-09: 100 mL via INTRAVENOUS

## 2024-06-09 NOTE — Telephone Encounter (Signed)
 Prior authorization submitted for HYDROcodone -Acetaminophen  5-325MG  tablets to [employer] via Latent.   Key: [id number]

## 2024-06-09 NOTE — Progress Notes (Signed)
 Remote ICD Transmission

## 2024-06-10 NOTE — Telephone Encounter (Signed)
 Pharmacy Patient Advocate Encounter  Received notification from [employer] that Prior Authorization for HYDROcodone -Acetaminophen  5-325MG  tablets has been DENIED.  Full denial letter will be uploaded to the media tab. See denial reason below.  Lacks required supporting records demonstrating opioid treatment need and current plan of care per [employer]io[employer]ges[employer] clinical criteria.     PA #/Case ID/Reference #: [id number]

## 2024-06-13 ENCOUNTER — Ambulatory Visit (INDEPENDENT_AMBULATORY_CARE_PROVIDER_SITE_OTHER): Payer: BC Managed Care – PPO

## 2024-06-13 DIAGNOSIS — I472 Ventricular tachycardia, unspecified: Secondary | ICD-10-CM

## 2024-06-13 LAB — CUP PACEART REMOTE DEVICE CHECK
Battery Remaining Longevity: 150 mo
Battery Remaining Percentage: 100 %
Brady Statistic RV Percent Paced: 0 %
Date Time Interrogation Session: 20251013042100
HighPow Impedance: 76 Ohm
Implantable Lead Connection Status: 753985
Implantable Lead Implant Date: 20230414
Implantable Lead Location: 753860
Implantable Lead Model: 672
Implantable Lead Serial Number: 182871
Implantable Pulse Generator Implant Date: 20230414
Lead Channel Impedance Value: 743 Ohm
Lead Channel Pacing Threshold Amplitude: 0.7 V
Lead Channel Pacing Threshold Pulse Width: 0.4 ms
Lead Channel Setting Pacing Amplitude: 2.5 V
Lead Channel Setting Pacing Pulse Width: 0.4 ms
Lead Channel Setting Sensing Sensitivity: 0.5 mV
Pulse Gen Serial Number: 310848
Zone Setting Status: 755011

## 2024-06-14 NOTE — Progress Notes (Signed)
 Remote ICD Transmission

## 2024-06-15 ENCOUNTER — Ambulatory Visit: Payer: Self-pay | Admitting: Cardiology

## 2024-06-17 ENCOUNTER — Ambulatory Visit: Payer: Self-pay | Admitting: Family Medicine

## 2024-06-22 ENCOUNTER — Encounter (HOSPITAL_COMMUNITY): Payer: Self-pay | Admitting: Gastroenterology

## 2024-06-24 ENCOUNTER — Telehealth: Payer: Self-pay

## 2024-06-24 NOTE — Telephone Encounter (Signed)
 Procedure:EGD Procedure date: 06/28/24 Procedure location: [facility] Arrival Time: 7:28 Spoke with the patient Y/N: Y Any prep concerns? N  Has the patient obtained the prep from the pharmacy ? N Do you have a care partner and transportation: Y Any additional concerns? N

## 2024-06-27 ENCOUNTER — Telehealth: Payer: Self-pay

## 2024-06-27 NOTE — Telephone Encounter (Signed)
 Procedure:EGD Procedure date: 06/29/24 Procedure location: [facility] Arrival Time: 7:28 Spoke with the patient Y/N: Y Any prep concerns? N  Has the patient obtained the prep from the pharmacy ? N Do you have a care partner and transportation: Y Any additional concerns? N

## 2024-06-28 ENCOUNTER — Other Ambulatory Visit (HOSPITAL_COMMUNITY): Payer: Self-pay | Admitting: Internal Medicine

## 2024-06-28 NOTE — Anesthesia Preprocedure Evaluation (Signed)
 Anesthesia Evaluation    Reviewed: Allergy & Precautions, Patient's Chart, lab work & pertinent test results, reviewed documented beta blocker date and time   Airway Mallampati: II  TM Distance: >3 FB Neck ROM: Full    Dental no notable dental hx.    Pulmonary sleep apnea (had positive sleep study years ago but per pt was never given a mask)    Pulmonary exam normal breath sounds clear to auscultation       Cardiovascular hypertension (148/95 preop), Pt. on medications and Pt. on home beta blockers + CAD, + Cardiac Stents (DES to LAD and LCX 2015, 2023- plavix  LD:5d) and +CHF (LVEF 40-45%)  Normal cardiovascular exam+ pacemaker (for Vtach) + Cardiac Defibrillator (2023- last shock about 1 year ago)  Rhythm:Regular Rate:Normal  Echo 2023  1. Left ventricular ejection fraction, by estimation, is 40 to 45%. The  left ventricle has mildly decreased function. The left ventricle  demonstrates regional wall motion abnormalities (see scoring  diagram/findings for description). Left ventricular  diastolic parameters are indeterminate.   2. Right ventricular systolic function is normal. The right ventricular  size is normal. Tricuspid regurgitation signal is inadequate for assessing  PA pressure.   3. Right atrial size was mildly dilated.   4. The mitral valve is normal in structure. No evidence of mitral valve  regurgitation. No evidence of mitral stenosis.   5. The aortic valve is tricuspid. Aortic valve regurgitation is not  visualized. Aortic valve sclerosis is present, with no evidence of aortic  valve stenosis.   6. The inferior vena cava is normal in size with greater than 50%  respiratory variability, suggesting right atrial pressure of 3 mmHg.     Neuro/Psych  Headaches, Seizures - (no meds, last one about a year ago- per pt was never taking meds for this. per pt not sure if its seizures or syncope), Well Controlled,  PSYCHIATRIC  DISORDERS Anxiety Depression       GI/Hepatic hiatal hernia,GERD  Medicated and Controlled,,(+)     substance abuse  marijuana useBarretts high grade dysplasia    Endo/Other  diabetes, Well Controlled, Type 2, Oral Hypoglycemic Agents  FS 125 preop  Renal/GU negative Renal ROS     Musculoskeletal  (+) Arthritis , Osteoarthritis,  Chronic LBP    Abdominal   Peds  Hematology negative hematology ROS (+)   Anesthesia Other Findings   Reproductive/Obstetrics                              Anesthesia Physical Anesthesia Plan  ASA: 3  Anesthesia Plan: MAC   Post-op Pain Management:    Induction:   PONV Risk Score and Plan: 2 and Propofol  infusion and TIVA  Airway Management Planned: Natural Airway and Simple Face Mask  Additional Equipment: None  Intra-op Plan:   Post-operative Plan:   Informed Consent: I have reviewed the patients History and Physical, chart, labs and discussed the procedure including the risks, benefits and alternatives for the proposed anesthesia with the patient or authorized representative who has indicated his/her understanding and acceptance.       Plan Discussed with: CRNA  Anesthesia Plan Comments: (Last cscope 04/2024 under MAC no issues)         Anesthesia Quick Evaluation

## 2024-06-29 ENCOUNTER — Other Ambulatory Visit: Payer: Self-pay

## 2024-06-29 ENCOUNTER — Ambulatory Visit (HOSPITAL_COMMUNITY)
Admission: RE | Admit: 2024-06-29 | Discharge: 2024-06-29 | Disposition: A | Discharge status: Home / Self Care | Attending: Gastroenterology | Admitting: Gastroenterology

## 2024-06-29 ENCOUNTER — Telehealth (HOSPITAL_COMMUNITY): Payer: Self-pay

## 2024-06-29 ENCOUNTER — Encounter (HOSPITAL_COMMUNITY): Payer: Self-pay | Admitting: Anesthesiology

## 2024-06-29 ENCOUNTER — Encounter (HOSPITAL_COMMUNITY): Payer: Self-pay | Admitting: Gastroenterology

## 2024-06-29 ENCOUNTER — Ambulatory Visit (HOSPITAL_BASED_OUTPATIENT_CLINIC_OR_DEPARTMENT_OTHER): Payer: Self-pay | Admitting: Anesthesiology

## 2024-06-29 ENCOUNTER — Encounter (HOSPITAL_COMMUNITY): Admission: RE | Disposition: A | Payer: Self-pay | Source: Home / Self Care | Attending: Gastroenterology

## 2024-06-29 ENCOUNTER — Telehealth: Payer: Self-pay

## 2024-06-29 DIAGNOSIS — I2510 Atherosclerotic heart disease of native coronary artery without angina pectoris: Secondary | ICD-10-CM

## 2024-06-29 DIAGNOSIS — K2970 Gastritis, unspecified, without bleeding: Secondary | ICD-10-CM | POA: Diagnosis not present

## 2024-06-29 DIAGNOSIS — I509 Heart failure, unspecified: Secondary | ICD-10-CM | POA: Insufficient documentation

## 2024-06-29 DIAGNOSIS — Z79899 Other long term (current) drug therapy: Secondary | ICD-10-CM | POA: Diagnosis not present

## 2024-06-29 DIAGNOSIS — Z7902 Long term (current) use of antithrombotics/antiplatelets: Secondary | ICD-10-CM | POA: Diagnosis not present

## 2024-06-29 DIAGNOSIS — Z955 Presence of coronary angioplasty implant and graft: Secondary | ICD-10-CM | POA: Diagnosis not present

## 2024-06-29 DIAGNOSIS — Z7984 Long term (current) use of oral hypoglycemic drugs: Secondary | ICD-10-CM | POA: Insufficient documentation

## 2024-06-29 DIAGNOSIS — K2289 Other specified disease of esophagus: Secondary | ICD-10-CM

## 2024-06-29 DIAGNOSIS — K219 Gastro-esophageal reflux disease without esophagitis: Secondary | ICD-10-CM | POA: Diagnosis not present

## 2024-06-29 DIAGNOSIS — K449 Diaphragmatic hernia without obstruction or gangrene: Secondary | ICD-10-CM

## 2024-06-29 DIAGNOSIS — G4730 Sleep apnea, unspecified: Secondary | ICD-10-CM | POA: Diagnosis not present

## 2024-06-29 DIAGNOSIS — K22711 Barrett's esophagus with high grade dysplasia: Secondary | ICD-10-CM

## 2024-06-29 DIAGNOSIS — I110 Hypertensive heart disease with heart failure: Secondary | ICD-10-CM | POA: Diagnosis not present

## 2024-06-29 DIAGNOSIS — G8929 Other chronic pain: Secondary | ICD-10-CM

## 2024-06-29 DIAGNOSIS — Z95810 Presence of automatic (implantable) cardiac defibrillator: Secondary | ICD-10-CM | POA: Diagnosis not present

## 2024-06-29 DIAGNOSIS — E119 Type 2 diabetes mellitus without complications: Secondary | ICD-10-CM | POA: Insufficient documentation

## 2024-06-29 HISTORY — PX: ESOPHAGOGASTRODUODENOSCOPY: SHX5428

## 2024-06-29 HISTORY — PX: GI RADIOFREQUENCY ABLATION: SHX6807

## 2024-06-29 LAB — GLUCOSE, CAPILLARY: Glucose-Capillary: 125 mg/dL — ABNORMAL HIGH (ref 70–99)

## 2024-06-29 SURGERY — EGD (ESOPHAGOGASTRODUODENOSCOPY)
Anesthesia: Monitor Anesthesia Care

## 2024-06-29 MED ORDER — SODIUM CHLORIDE 0.9 % IV SOLN: INTRAVENOUS | Status: DC

## 2024-06-29 MED ORDER — SUCRALFATE 1 G PO TABS
1.0000 g | ORAL_TABLET | Freq: Four times a day (QID) | ORAL | 3 refills | Status: AC
Start: 2024-06-29 — End: 2025-06-29

## 2024-06-29 MED ORDER — LIDOCAINE 2% (20 MG/ML) 5 ML SYRINGE
INTRAMUSCULAR | Status: DC | PRN
  Administered 2024-06-29 (×2): 60 mg via INTRAVENOUS

## 2024-06-29 MED ORDER — LIDOCAINE VISCOUS HCL 2 % MT SOLN: 30.0000 mL | Freq: Four times a day (QID) | OROMUCOSAL | 3 refills | Status: DC

## 2024-06-29 MED ORDER — HYDROCODONE-ACETAMINOPHEN 7.5-325 MG/15ML PO SOLN: 15.0000 mL | Freq: Four times a day (QID) | ORAL | 0 refills | Status: DC | PRN

## 2024-06-29 MED ORDER — PROPOFOL 500 MG/50ML IV EMUL
INTRAVENOUS | Status: DC | PRN
Start: 2024-06-29 — End: 2024-06-29
  Administered 2024-06-29: 150 ug/kg/min via INTRAVENOUS
  Administered 2024-06-29: 30 mg via INTRAVENOUS
  Administered 2024-06-29: 25 mg via INTRAVENOUS
  Administered 2024-06-29: 20 mg via INTRAVENOUS
  Administered 2024-06-29: 50 mg via INTRAVENOUS

## 2024-06-29 MED ORDER — STERILE WATER FOR INJECTION IJ SOLN
RESPIRATORY_TRACT | Status: DC | PRN
  Administered 2024-06-29: 60 mL via OROMUCOSAL

## 2024-06-29 MED ORDER — ONDANSETRON HCL 4 MG/2ML IJ SOLN
INTRAMUSCULAR | Status: DC | PRN
Start: 2024-06-29 — End: 2024-06-29
  Administered 2024-06-29: 4 mg via INTRAVENOUS

## 2024-06-29 MED ORDER — ALUM & MAG HYDROXIDE-SIMETH 200-200-20 MG/5ML PO SUSP
30.0000 mL | Freq: Once | ORAL | Status: AC
  Administered 2024-06-29: 30 mL via ORAL
  Filled 2024-06-29: qty 30

## 2024-06-29 MED ORDER — ACETYLCYSTEINE 20 % IN SOLN
RESPIRATORY_TRACT | Status: AC
  Filled 2024-06-29: qty 4

## 2024-06-29 MED ORDER — PROPOFOL 1000 MG/100ML IV EMUL
INTRAVENOUS | Status: AC
  Filled 2024-06-29: qty 100

## 2024-06-29 MED ORDER — HYOSCYAMINE SULFATE 0.125 MG SL SUBL
0.2500 mg | SUBLINGUAL_TABLET | Freq: Once | SUBLINGUAL | Status: AC
  Administered 2024-06-29: 0.25 mg via SUBLINGUAL
  Filled 2024-06-29: qty 2

## 2024-06-29 MED ORDER — FENTANYL CITRATE (PF) 100 MCG/2ML IJ SOLN
INTRAMUSCULAR | Status: AC
  Filled 2024-06-29: qty 2

## 2024-06-29 MED ORDER — FENTANYL CITRATE (PF) 100 MCG/2ML IJ SOLN
INTRAMUSCULAR | Status: DC | PRN
  Administered 2024-06-29 (×3): 25 ug via INTRAVENOUS

## 2024-06-29 MED ORDER — ESMOLOL HCL 100 MG/10ML IV SOLN
INTRAVENOUS | Status: DC | PRN
Start: 2024-06-29 — End: 2024-06-29
  Administered 2024-06-29: 50 mg via INTRAVENOUS

## 2024-06-29 NOTE — Telephone Encounter (Signed)
 GI cocktail Rx printed and faxed to [facility].

## 2024-06-29 NOTE — Op Note (Signed)
 [facility] Patient Name: Andrew Huerta Procedure Date: 06/29/2024 MRN: [id number] Attending MD: Aloha Finner , MD, [id number] Date of Birth: 09-15-1965 CSN: [id number] Age: 58 Admit Type: Outpatient Procedure:                Upper GI endoscopy Indications:              For therapy of Barrett's esophagus, Barrett's                            esophagus with high grade dysplasia, For endoscopic                            therapy of Barrett's esophagus with high grade                            dysplasia Providers:                Aloha Finner, MD, Ozell Pouch, Curtistine Bishop, Technician Referring MD:              Medicines:                Monitored Anesthesia Care Complications:            No immediate complications. Estimated Blood Loss:     Estimated blood loss was minimal. Procedure:                Pre-Anesthesia Assessment:                           - Prior to the procedure, a History and Physical                            was performed, and patient medications and                            allergies were reviewed. The patient's tolerance of                            previous anesthesia was also reviewed. The risks                            and benefits of the procedure and the sedation                            options and risks were discussed with the patient.                            All questions were answered, and informed consent                            was obtained. Prior Anticoagulants: The patient has                            taken Plavix  (clopidogrel ),  last dose was 5 days                            prior to procedure. ASA Grade Assessment: III - A                            patient with severe systemic disease. After                            reviewing the risks and benefits, the patient was                            deemed in satisfactory condition to undergo the                             procedure.                           After obtaining informed consent, the endoscope was                            passed under direct vision. Throughout the                            procedure, the patient's blood pressure, pulse, and                            oxygen saturations were monitored continuously. The                            [id number]-H190 ([id number]) Olympus endoscope was introduced                            through the mouth, and advanced to the second part                            of duodenum. The upper GI endoscopy was                            accomplished without difficulty. The patient                            tolerated the procedure. Scope In: Scope Out: Findings:      No gross lesions were noted in the proximal esophagus and in the mid       esophagus.      The esophagus and gastroesophageal junction were examined with white       light and narrow band imaging (NBI) from a forward view and retroflexed       position. There were esophageal mucosal changes consistent with       long-segment Barrett's esophagus. These changes involved the mucosa at       the upper extent of the gastric folds (37 cm from the incisors)       extending to the irregulary Z-line. One tongue of salmon-colored mucosa  was present from 32 to 37 cm, one tongue of salmon-colored mucosa was       present from 34 to 37 cm and one tongue of salmon-colored mucosa was       present from 35 to 37 cm. The maximum longitudinal extent of these       esophageal mucosal changes was 5 cm in length. Focal radiofrequency       ablation of Barrett's esophagus was performed. With the endoscope in       place, the position and extent of the Barrett's mucosa and the anatomic       landmarks including proximal and distal extent of Barrett's mucosa and       top of gastric folds were noted. Endoscopic visualization identified an       ablation site including the entire visible Barrett's segment. The        Barrett's mucosa was irrigated with N-acetylcysteine (Mucomyst) 1% mixed       with water. Esophageal contents were suctioned. The endoscope was then       removed from the patient. The Barrx-90 radiofrequency ablation catheter       was attached to the tip of the endoscope. The endoscope with the       attached radiofrequency ablation catheter was then passed transorally       under direct vision into the esophagus and advanced to the areas of       Barrett's mucosa. The areas included tongues of Barrett's mucosa. The       radiofrequency ablation catheter was placed in contact with the surface       of the Barrett's mucosa under direct visualization and energy was       applied twice at 12 J/cm2. Ablation was repeated in a likewise fashion       to treat the entire area of suspected Barrett's mucosa. The ablation       zone was cleaned of coagulative debris. The ablation catheter and       endoscope were then removed and the catheter was cleaned. The catheter       and endoscope were reinserted into the esophagus. A second round of       ablation was then performed. Energy was applied twice at 12 J/cm2 to       retreat the areas of Barrett's epithelium that had been treated with the       first series of ablation. The areas of the esophagus where Barrett's       mucosa had been ablated were examined. Areas of Barrett's esophagus were       completely ablated. Areas of visible Barrett's esophagus were completely       ablated. Whitish changes of ablated mucosa were present. The total       number of energy applications for all mucosal sites treated was 58.       There were no areas of unablated Barrett's esophagus present. There was       no evidence for perforation.      A 3 cm hiatal hernia was present.      Patchy mild inflammation characterized by erosions and erythema was       found in the entire examined stomach.      No gross lesions were noted in the duodenal bulb, in the first  portion       of the duodenum and in the second portion of the duodenum. Impression:               -  No gross lesions in the proximal esophagus and in                            the mid esophagus.                           - Esophageal mucosal changes consistent with                            long-segment Barrett's esophagus. Treated with                            radiofrequency ablation as noted above.                           - 3 cm hiatal hernia.                           - Gastritis. Previously biopsied so not redone.                           - No gross lesions in the duodenal bulb, in the                            first portion of the duodenum and in the second                            portion of the duodenum. Moderate Sedation:      Not Applicable - Patient had care per Anesthesia. Recommendation:           - The patient will be observed post-procedure,                            until all discharge criteria are met.                           - Discharge patient to home.                           - Patient has a contact number available for                            emergencies. The signs and symptoms of potential                            delayed complications were discussed with the                            patient. Return to normal activities tomorrow.                            Written discharge instructions were provided to the                            patient.                           -  Clear liquid diet for 24 hours. Then, full liquid                            diet for 48 hours. Then, soft diet for 1 week                            thereafter. Then advance your diet thereafter to                            regular.                           - Continue your PPI 40 mg twice daily for the next                            3 months (refills sent to [facility])                           - You will be prescribed a GI cocktail                             lidocaine /antiacid mixture that you can take up to                            4 times per day as needed (prescription has been                            sent to [facility]) - take no matter                            what for at least first 3-days.                           - You will be prescribed liquid Tylenol  with                            hydrocodone  that you can use up to 4 times daily                            for the next 72 hours if needed (prescription to be                            sent to [facility]) - I recommend using                            this even if you do not have pain for the first 2-3                            days.                           - You will be prescribed Sucralfate  Suspension and  you will take this 4 times daily (make sure you are                            not taking any other medication 1 hour before or 1                            hour after this medication is taken) to allow                            coating and healing of your esophagus - this needs                            to be taken for at least 51-month (you should have                            refills available but call if you run out - sent to                            [facility]).                           - Observe patient's clinical course.                           - Repeat upper endoscopy in 4 months per protocol.                           - The findings and recommendations were discussed                            with the patient.                           - The findings and recommendations were discussed                            with the designated responsible adult. Procedure Code(s):        --- Professional ---                           [phone], Esophagogastroduodenoscopy, flexible,                            transoral; with ablation of tumor(s), polyp(s), or                            other lesion(s)  (includes pre- and post-dilation                            and guide wire passage, when performed) Diagnosis Code(s):        --- Professional ---                           K44.9, Diaphragmatic  hernia without obstruction or                            gangrene                           K29.70, Gastritis, unspecified, without bleeding                           K22.711, Barrett's esophagus with high grade                            dysplasia CPT copyright 2022 [employer]. All rights reserved. The codes documented in this report are preliminary and upon coder review may  be revised to meet current compliance requirements. Aloha Finner, MD 06/29/2024 9:57:53 AM Number of Addenda: 0

## 2024-06-29 NOTE — Discharge Instructions (Addendum)

## 2024-06-29 NOTE — Telephone Encounter (Signed)
 Recall entered

## 2024-06-29 NOTE — Transfer of Care (Signed)
 Immediate Anesthesia Transfer of Care Note  Patient: Andrew Huerta  Procedure(s) Performed: EGD (ESOPHAGOGASTRODUODENOSCOPY) RADIOFREQUENCY ABLATION, UPPER GI TRACT, ENDOSCOPIC  Patient Location: PACU and Endoscopy Unit  Anesthesia Type:MAC  Level of Consciousness: drowsy and patient cooperative  Airway & Oxygen Therapy: Patient Spontanous Breathing and Patient connected to face mask oxygen  Post-op Assessment: Report given to RN and Post -op Vital signs reviewed and stable  Post vital signs: Reviewed and stable  Last Vitals:  Vitals Value Taken Time  BP    Temp    Pulse    Resp    SpO2      Last Pain:  Vitals:   06/29/24 0738  TempSrc: Temporal  PainSc: 6          Complications: No notable events documented.

## 2024-06-29 NOTE — Anesthesia Postprocedure Evaluation (Signed)
 Anesthesia Post Note  Patient: JARVIN OGREN  Procedure(s) Performed: EGD (ESOPHAGOGASTRODUODENOSCOPY) RADIOFREQUENCY ABLATION, UPPER GI TRACT, ENDOSCOPIC     Patient location during evaluation: PACU Anesthesia Type: MAC Level of consciousness: awake and alert Pain management: pain level controlled Vital Signs Assessment: post-procedure vital signs reviewed and stable Respiratory status: spontaneous breathing, nonlabored ventilation and respiratory function stable Cardiovascular status: blood pressure returned to baseline and stable Postop Assessment: no apparent nausea or vomiting Anesthetic complications: no   No notable events documented.  Last Vitals:  Vitals:   06/29/24 1010 06/29/24 1026  BP: (!) 164/99   Pulse: 79 75  Resp: 19 (!) 8  Temp:    SpO2: 99% 99%    Last Pain:  Vitals:   06/29/24 1000  TempSrc:   PainSc: 0-No pain                 Almarie CHRISTELLA Marchi
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
## 2024-06-29 NOTE — Telephone Encounter (Signed)
 Incoming call from [employer], pt's friend and emergency contact. States that he was prescribed acetaminophen  w/ hydrocodone  after his EGD this AM. States that the pharmacy says that this medication is not covered by insurance and he would need to contact his physician to attempt to get it covered. Advised that this RN would pass message along to MD Mansouraty and Odetta Curly, RN at [facility]. Pt's friend demonstrated understanding, no further concerns at the end of the call. Staff message sent to MD Mansouraty and Odetta Curly, RN.  Ozell VEAR Pouch, RN 06/29/24 12:17 PM

## 2024-06-29 NOTE — Anesthesia Procedure Notes (Signed)
 Procedure Name: MAC Date/Time: 06/29/2024 9:09 AM  Performed by: Laverda Burnard LABOR, CRNAPre-anesthesia Checklist: Patient identified, Emergency Drugs available, Suction available and Patient being monitored Patient Re-evaluated:Patient Re-evaluated prior to induction Oxygen Delivery Method: Simple face mask Preoxygenation: Pre-oxygenation with 100% oxygen Induction Type: IV induction Airway Equipment and Method: Bite block Placement Confirmation: positive ETCO2 and breath sounds checked- equal and bilateral Dental Injury: Teeth and Oropharynx as per pre-operative assessment

## 2024-06-29 NOTE — Telephone Encounter (Signed)
-----   Message from [facility] sent at 06/29/2024 11:43 AM EDT ----- Regarding: Followup Philippa Vessey, Repeat EGD in 3-4 months for Barrett's protocol. Thanks. GM  FYI HD

## 2024-06-30 ENCOUNTER — Encounter: Payer: Self-pay | Admitting: Gastroenterology

## 2024-06-30 ENCOUNTER — Other Ambulatory Visit (HOSPITAL_COMMUNITY): Payer: Self-pay

## 2024-06-30 NOTE — Telephone Encounter (Signed)
 The pt states that his copay for his pain med is $50 and may need a prior auth. Rosina have you seen anything for this pt?

## 2024-06-30 NOTE — Telephone Encounter (Signed)
 Noted pt advised and aware of the above

## 2024-06-30 NOTE — Telephone Encounter (Signed)
 Patient requesting nurse in regards  to medication. Please advise.

## 2024-06-30 NOTE — Telephone Encounter (Signed)
 Dr Wilhelmenia the pt is not able to afford his pain medication that was sent.  Please advise alternatives

## 2024-07-01 ENCOUNTER — Encounter: Payer: Self-pay | Admitting: Cardiology

## 2024-07-01 ENCOUNTER — Other Ambulatory Visit: Payer: Self-pay

## 2024-07-01 ENCOUNTER — Encounter: Payer: Self-pay | Admitting: Family Medicine

## 2024-07-01 MED ORDER — OXYCODONE-ACETAMINOPHEN 7.5-325 MG PO TABS: 1.0000 | ORAL_TABLET | Freq: Four times a day (QID) | ORAL | 0 refills | Status: AC | PRN

## 2024-07-01 NOTE — Telephone Encounter (Signed)
 I guess this is for the Hycet? If this is confirmed, then go ahead and cancel this patient's prescription. This afternoon, when I am in the office, I will send in a prescription for Percocet or oxycodone  in an oral form rather than liquid. Thanks. GM

## 2024-07-01 NOTE — Addendum Note (Signed)
 Addended by: WILHELMENIA ROERS on: 07/01/2024 04:05 PM   Modules accepted: Orders

## 2024-07-04 ENCOUNTER — Telehealth: Payer: Self-pay | Admitting: *Deleted

## 2024-07-04 NOTE — Telephone Encounter (Signed)
 Alert remote transmission: Antitachycardia pacing (ATP) therapy and one 41J HV shock delivered to convert arrhythmia.  Jul 01, 2024 21:16 - Ventricular shock therapy delivered to convert arrhythmia. Event occurred 10/31 @ 21:16, duration 46sec,  EGM c/w irregular R-R with periods of regularity, ATP delivered x6 followed by 41J of HV therapy slowing to regualr VS -

## 2024-07-04 NOTE — Telephone Encounter (Signed)
 See other mychart message to Cardiologist.

## 2024-07-04 NOTE — Telephone Encounter (Signed)
 Spoke with patient on phone regarding ICD shock  Reviewed 07/01/24 transmission (see 11/3 Mychart message from [place])  Episode V-EGM c/w fast and irregular R-R waves and device appears to have given inappropriate therapy  Patient has no documented history of Afib and is not on an [facility]  Spoke with [employer] rep Joey regarding rhythm and treatment given to patient and reprogramming adjustments were advised:  - Increase VT zone to 220 - Extend out VT zone duration from 10 -->30 seconds - Increase VF zone up to 250.  Patient overdue for follow-up. LOV 2023  Called and spoke with patient regarding ICD shock and assess for symptoms  Patient stated that he felt better but is currently recovering from throat surgery he had last week so is still quite tired  Patient stated that he is compliant with all his prescribed medications  This RN told patient that he was overdue for a follow-up visit and said we need to see him ASAP  Patient agreeable to this but said he can't get out of bed currently and also doesn't have reliable transportation and would need a friend to drive them next week  This RN scheduled patient for appointment 07/13/24 to see Jodie Passey, PA-C  Appointment date, time, and location verbally confirmed on telephone and also sent to patient via MyChart message for secondary confirmation  ED precautions and shock plan reviewed with patient as well  Routing to providers for awareness

## 2024-07-07 ENCOUNTER — Telehealth: Payer: Self-pay

## 2024-07-07 NOTE — Telephone Encounter (Signed)
 Patient calls nurse line requesting refill on pain medication, hydrocodone -acetaminophen  5-325 mg.   Medication is not currently on medication list.   Will forward request to PCP.   Please advise.   Chiquita JAYSON English, RN

## 2024-07-08 MED ORDER — OXYCODONE HCL 5 MG PO TABS: 5.0000 mg | ORAL_TABLET | Freq: Three times a day (TID) | ORAL | 0 refills | Status: DC | PRN

## 2024-07-11 ENCOUNTER — Other Ambulatory Visit (HOSPITAL_COMMUNITY): Payer: Self-pay

## 2024-07-11 NOTE — Telephone Encounter (Signed)
 PA request rec'd for patients Oxycodone .   Test claim shows prescribers DEA/ Prescriber ID not found. An attending may need to resend the RX.  Also the PA requirements will be the same as the denial for the Norco. He will need an appt. Otherwise he can only get a 5 day supply of this RX without a prior authorization.

## 2024-07-12 ENCOUNTER — Ambulatory Visit (INDEPENDENT_AMBULATORY_CARE_PROVIDER_SITE_OTHER): Admitting: Family Medicine

## 2024-07-12 ENCOUNTER — Encounter: Payer: Self-pay | Admitting: Family Medicine

## 2024-07-12 VITALS — BP 116/76 | HR 83 | Wt 125.0 lb

## 2024-07-12 DIAGNOSIS — M549 Dorsalgia, unspecified: Secondary | ICD-10-CM

## 2024-07-12 DIAGNOSIS — F419 Anxiety disorder, unspecified: Secondary | ICD-10-CM

## 2024-07-12 DIAGNOSIS — Z23 Encounter for immunization: Secondary | ICD-10-CM | POA: Diagnosis not present

## 2024-07-12 DIAGNOSIS — G8929 Other chronic pain: Secondary | ICD-10-CM | POA: Diagnosis not present

## 2024-07-12 DIAGNOSIS — R634 Abnormal weight loss: Secondary | ICD-10-CM | POA: Diagnosis not present

## 2024-07-12 MED ORDER — ESCITALOPRAM OXALATE 20 MG PO TABS: 20.0000 mg | ORAL_TABLET | Freq: Every day | ORAL | 2 refills | Status: AC

## 2024-07-12 MED ORDER — PRAZOSIN HCL 1 MG PO CAPS: 1.0000 mg | ORAL_CAPSULE | Freq: Every day | ORAL | 2 refills | Status: AC

## 2024-07-12 NOTE — Progress Notes (Unsigned)
 Electrophysiology Office Note:   ID:  Andrew, Huerta 1965/11/05, MRN [id number]  Primary Cardiologist: Peter Jordan, MD Electrophysiologist: Donnice DELENA Primus, MD      History of Present Illness:   Andrew Huerta is a 58 y.o. male with h/o CAD s/p PCI-LAD/LCx in 2015, PCI-RCA in 2020, PCI-LAD in 2023, ICM, chronic systolic heart failure, VT s/p ICD, hypertension, hyperlipidemia, primary hyperaldosteronism, adrenal tumor s/p adrenal gland resection, Barrett's esophagus, and OSA seen today for acute visit due to ICD shock.    Patient previously hospitalized in April of 2023 with VT, and cardiogenic shock. Given CAD, VT felt scar mediated and he had single lead ICD placed prior to discharge, also started on Amiodarone . Patient had 2 more shocks that summer for VT and Amiodarone  was increased to 200mg  BID temporarily. Patient found to have VT responsive to ATP in September of 2023 though had been taking his Coreg  incorrectly. By remote checks following this last OV, no ventricular arrhythmias seen. On 07/01/24 patient was shocked by his device. EGM review suggested that this was inappropriate therapy for likely new onset afib with RVR. Patient presents today to discuss options to prevent inappropriate ICD therapy. He denies any symptoms suggestive of arrhythmia prior to this shock. With prior VT and appropriate ICD therapy, patient recalls clear awareness of rapid heart rate.   Since most recent shock, patient reports significant stress/concern about being shocked again. He was around family when ICD fired and understandably this was traumatic for both patient and family.   Regarding HF and CAD symptoms, patient reports that he continues to have intermittent chest discomfort, associated with stress and work. Denies dyspnea/orthopnea and has not noted LE edema.  Review of systems complete and found to be negative unless listed in HPI.   EP Information / Studies Reviewed:    EKG is ordered  today. Personal review as below.  EKG Interpretation Date/Time:  Wednesday July 13 2024 10:06:58 EST Ventricular Rate:  93 PR Interval:  158 QRS Duration:  90 QT Interval:  362 QTC Calculation: 450 R Axis:   100  Text Interpretation: Normal sinus rhythm Rightward axis Nonspecific T wave abnormality When compared with ECG of 19-Aug-2023 19:13, No significant change was found Confirmed by Trudy Birmingham [phone]) on 07/13/2024 10:09:17 AM    ICD Interrogation-  reviewed in detail today,  See PACEART report.  Arrhythmia/Device History BSX ICD LATITUDE-CL   Physical Exam:   VS:  BP 126/80   Pulse 93   Ht 5' 7 (1.702 m)   Wt 124 lb (56.2 kg)   SpO2 97%   BMI 19.42 kg/m    Wt Readings from Last 3 Encounters:  07/13/24 124 lb (56.2 kg)  07/12/24 125 lb (56.7 kg)  06/29/24 128 lb (58.1 kg)     GEN: No acute distress  NECK: No JVD; No carotid bruits CARDIAC: Regular rate and rhythm with occasional ectopy, no murmurs, rubs, gallops RESPIRATORY:  Clear to auscultation without rales, wheezing or rhonchi  ABDOMEN: Soft, non-tender, non-distended EXTREMITIES:  No edema; No deformity   ASSESSMENT AND PLAN:    Ventricular arrhythmia  s/p [employer] single chamber ICD  ICD with inappropriate therapy for rapid but irregularly irregular rhythm, consistent with afib. Given prior appropriate therapies for VT at HR of ~215bpm, will increase VT zone to 210bpm and extend VT zone duration to 30 seconds. VF zone unchanged at 240bpm. See Pace Art report Continue Amiodarone  200mg  daily. Check CMET and TSH/T4.  Paroxysmal atrial fibrillation  with RVR Secondary hypercoagulable state This patients CHA2DS2-VASc Score and unadjusted Ischemic Stroke Rate (% per year) is equal to 4.8 % stroke rate/year from a score of 4 As above, irregularly irregular and rapid rhythm on ICD EGM, consistent with afib. Will start patient on Eliquis and place 14 day [name] to assess burden. In order to allow better  rate control, will stop Coreg  and start Toprol  XL 50mg . Check CBC and CMET ~2 weeks.   CAD s/p PCI Hyperlipidemia Prior stenting to LAD/LCX 2015, RCA 2020, LAD 2023. With new Eliquis start, will stop Plavix  and continue with ASA only. Patient overdue for follow up with Dr. Jordan, can discuss need for continuing ASA with Eliquis vs Eliquis alone. Continue statin.  Chronic systolic CHF (improved) LVEF improved to 40-45% on 2023 TTE. Appears euvolemic today. Continue Jardiance . Switching from Coreg  to Toprol  XL as above. Overdue for follow up with general cardiology for GDMT review.   Hypertension BP well controlled today. Occasional orthostatic symptoms reported. Continue hydrochlorothiazide . Transition Coreg  to Toprol  XL.   Disposition:   Follow up with EP APP 2 months. General cardiology follow up is overdue.    Signed, Artist Pouch, PA-C

## 2024-07-12 NOTE — Patient Instructions (Addendum)
 1) For your mood and anxiety: - We will increase lexapro  to 20mg  daily - Start taking prazosin 1mg  about 1 hour before bedtime. This can help with nightmares.   2) I am sending a referral for you to see a psychiatrist. Expect a phone call in the next few weeks.  3) I am also checking a lab and chest Xray for you. We are looking for any other causes of your weight loss.   4) Hopefully as we treat your stress and anxiety, your muscle pains will also improve. I recommend getting some regular exercise. Going for 30 minute walks, biking, stretching.

## 2024-07-12 NOTE — Progress Notes (Unsigned)
 SUBJECTIVE:   CHIEF COMPLAINT / HPI:   Andrew Huerta is a 58yo M that pf mood f/u.   - Had EGD and barret esophagus ablation w/ GI. Reports difficulty eating after this procedure such as feeling of yogurt getting stuck in his throat. He was told he may need further procedures to stretch out strictures.  - Also reports his defibrillator went off, reached out to his Cardiologist. Has appointment to see Cardiology tomorrow.   Depressed and Anxious Mood  - Reports he is struggling with anxiety about not being able to do things that he used to be able to. Then he feels depressed about his inability to take care fo things.  - Reports having trouble sleeping. Having nightmares about dying.  - He is really worried about his defibrillator going off again. He is anxious about being in need in front of his family and is unable to help them.   Pain Control  Chronic Shoulder Pain - Was on chronic pain management with hydrocodone -acetaminophen  5-325 mg q8h prn - Reports that it was helpful for controlling his chronic shoulder pain - I had previously sent in oxycodone  6mg  in the meantime since [facility]. - Reports that his shoulder pain is constant. He has trouble lifting his grandkids due to pain.    PERTINENT  PMH / PSH: CAD, HTN, CHF, barrett esophagus, carpal tunnel, PTSD, MDD  OBJECTIVE:   BP 116/76   Pulse 83   Wt 125 lb (56.7 kg)   SpO2 98%   BMI 19.58 kg/m   General: Alert, pleasant man. NAD. HEENT: NCAT. MMM. CV: RRR, no murmurs. Cap refill <2. Resp: CTAB, no wheezing or crackles. Normal WOB on RA.  Abm: Soft, nontender, nondistended. BS present. Ext: Moves all ext spontaneously Skin: Warm, well perfused  Psych: Denies active SI or plan  ASSESSMENT/PLAN:   Assessment & Plan Anxiety Acute worsening of chronic anxiety and depressed mood. His anxiety seems to stem from a feeling of helplessness from his chronic pain and defibrillator going off, making him feel like he is a burden to his  family. I suspect this is exacerbating his chronic pain and loss of appetite. Protective factors include his family and not wanting to place the burden of suicide on his family. He is open to seeing a psychiatrist. - Increase lexapro  10 to 20mg  daily - Start prazosin 10mg  nightly for nightmare  - consider uptitrating if tolerating - Referral to psychiatry Unintentional weight loss ~23lb weight loss since January 2025, now weight seems to be stabilizing. Differential includes malignancy, mood/anxiety, and esophageal pathologies. For malignancy, will check PSA and CXR. CTAP notable for hypoenhancement of upper L kidney, which could be scarring from prior L adrenalectomy. Has also seen [facility] for R adenoma, did not recommend surgical removal. Other workup including colonoscopy, HIV, HepC, LDH, RPR were unremarkable. Will continue to work on mood/anxiety as above. Esophageal issues including barrets are being managed by GI as well. - f/u CXR, PSA Chronic back pain, unspecified back location, unspecified back pain laterality Has chronic back and shoulder pain, which pt previously took  hydrocodone -acetaminophen  5-325 mg q8h prn, but pt has had difficulty with insurance covering this. It is my medical opinion that pt should continue taking hydrocodone -acetaminophen  as part of his multimodal pain management. I have provided a short course of oxycodone  5mg  q8hprn while waiting for insurance approval.  - If approved, continue  hydrocodone -acetaminophen  5-325 mg q8h prn at next refill Encounter for immunization  Twyla Nearing, MD [facility]

## 2024-07-13 ENCOUNTER — Ambulatory Visit: Attending: Student | Admitting: Cardiology

## 2024-07-13 ENCOUNTER — Other Ambulatory Visit (HOSPITAL_COMMUNITY): Payer: Self-pay

## 2024-07-13 ENCOUNTER — Ambulatory Visit: Payer: Self-pay | Admitting: Family Medicine

## 2024-07-13 ENCOUNTER — Encounter: Payer: Self-pay | Admitting: Cardiology

## 2024-07-13 ENCOUNTER — Ambulatory Visit

## 2024-07-13 ENCOUNTER — Encounter: Payer: Self-pay | Admitting: Family Medicine

## 2024-07-13 VITALS — BP 126/80 | HR 93 | Ht 67.0 in | Wt 124.0 lb

## 2024-07-13 DIAGNOSIS — I480 Paroxysmal atrial fibrillation: Secondary | ICD-10-CM | POA: Insufficient documentation

## 2024-07-13 DIAGNOSIS — I472 Ventricular tachycardia, unspecified: Secondary | ICD-10-CM | POA: Insufficient documentation

## 2024-07-13 DIAGNOSIS — E7800 Pure hypercholesterolemia, unspecified: Secondary | ICD-10-CM | POA: Diagnosis not present

## 2024-07-13 DIAGNOSIS — E785 Hyperlipidemia, unspecified: Secondary | ICD-10-CM | POA: Diagnosis not present

## 2024-07-13 DIAGNOSIS — Z95810 Presence of automatic (implantable) cardiac defibrillator: Secondary | ICD-10-CM | POA: Diagnosis not present

## 2024-07-13 DIAGNOSIS — I255 Ischemic cardiomyopathy: Secondary | ICD-10-CM | POA: Diagnosis not present

## 2024-07-13 DIAGNOSIS — I4891 Unspecified atrial fibrillation: Secondary | ICD-10-CM

## 2024-07-13 DIAGNOSIS — I10 Essential (primary) hypertension: Secondary | ICD-10-CM | POA: Diagnosis not present

## 2024-07-13 LAB — CUP PACEART INCLINIC DEVICE CHECK
Date Time Interrogation Session: 20251112111207
HighPow Impedance: 76 Ohm
HighPow Impedance: 84 Ohm
Implantable Lead Connection Status: 753985
Implantable Lead Implant Date: 20230414
Implantable Lead Location: 753860
Implantable Lead Model: 672
Implantable Lead Serial Number: 182871
Implantable Pulse Generator Implant Date: 20230414
Lead Channel Impedance Value: 731 Ohm
Lead Channel Pacing Threshold Amplitude: 0.7 V
Lead Channel Pacing Threshold Pulse Width: 0.4 ms
Lead Channel Sensing Intrinsic Amplitude: 17 mV
Lead Channel Setting Pacing Amplitude: 2.5 V
Lead Channel Setting Pacing Pulse Width: 0.4 ms
Lead Channel Setting Sensing Sensitivity: 0.5 mV
Pulse Gen Serial Number: 310848
Zone Setting Status: 755011

## 2024-07-13 LAB — PSA: Prostate Specific Ag, Serum: 1 ng/mL (ref 0.0–4.0)

## 2024-07-13 MED ORDER — APIXABAN 5 MG PO TABS
5.0000 mg | ORAL_TABLET | Freq: Two times a day (BID) | ORAL | 11 refills | Status: AC
  Filled 2024-07-13: qty 60, 30d supply, fill #0

## 2024-07-13 MED ORDER — METOPROLOL SUCCINATE ER 50 MG PO TB24: 50.0000 mg | ORAL_TABLET | Freq: Every day | ORAL | 3 refills | Status: AC

## 2024-07-13 NOTE — Patient Instructions (Addendum)
 Medication Instructions:  1.STOP carvedilol  2.START metoprolol  succinate (Toprol  XL) 50 mg daily 3.STOP plavix  4.START eliquis 5 mg twice daily(this will be sent to our [facility] pharmacy downstairs since so that you can receive a free 30 day trial) *If you need a refill on your cardiac medications before your next appointment, please call your pharmacy*  Lab Work: CBC, CMET, TSH, FreeT4-in a few weeks, you can go to any [name] to have these drawn, you do not need an appointment and you do not need to be fasting If you have labs (blood work) drawn today and your tests are completely normal, you will receive your results only by: MyChart Message (if you have MyChart) OR A paper copy in the mail If you have any lab test that is abnormal or we need to change your treatment, we will call you to review the results.  Testing/Procedures: Andrew Huerta- Long Term Monitor Instructions  Your physician has requested you wear a [name] patch monitor for 14 days.  This is a single patch monitor. [name] supplies one patch monitor per enrollment. Additional stickers are not available. Please do not apply patch if you will be having a Nuclear Stress Test,  Echocardiogram, Cardiac CT, MRI, or Chest Xray during the period you would be wearing the  monitor. The patch cannot be worn during these tests. You cannot remove and re-apply the  [name] patch monitor.  Your [name] patch monitor will be mailed 3 day [name] to your address on file. It may take 3-5 days  to receive your monitor after you have been enrolled.  Once you have received your monitor, please review the enclosed instructions. Your monitor  has already been registered assigning a specific monitor serial # to you.  Billing and Patient Assistance Program Information  We have supplied [name] with any of your insurance information on file for billing purposes. [name] offers a sliding scale Patient Assistance Program for patients that do not have  insurance, or  whose insurance does not completely cover the cost of the [name] monitor.  You must apply for the Patient Assistance Program to qualify for this discounted rate.  To apply, please call [name] at [phone], select option 4, select option 2, ask to apply for  Patient Assistance Program. [name] will ask your household income, and how many people  are in your household. They will quote your out-of-pocket cost based on that information.  [name] will also be able to set up a 32-month, interest-free payment plan if needed.  Applying the monitor   Shave hair from upper left chest.  Hold abrader disc by orange tab. Rub abrader in 40 strokes over the upper left chest as  indicated in your monitor instructions.  Clean area with 4 enclosed alcohol pads. Let dry.  Apply patch as indicated in monitor instructions. Patch will be placed under collarbone on left  side of chest with arrow pointing upward.  Rub patch adhesive wings for 2 minutes. Remove white label marked 1. Remove the white  label marked 2. Rub patch adhesive wings for 2 additional minutes.  While looking in a mirror, press and release button in center of patch. A small green light will  flash 3-4 times. This will be your only indicator that the monitor has been turned on.  Do not shower for the first 24 hours. You may shower after the first 24 hours.  Press the button if you feel a symptom. You will hear a small click. Record Date, Time and  Symptom in the [name].  When you are ready to remove the patch, follow instructions on the last 2 pages of Patient  Logbook. Stick patch monitor onto the last page of [name].  Place [name] in the blue and white box. Use locking tab on box and tape box closed  securely. The blue and white box has prepaid postage on it. Please place it in the mailbox as  soon as possible. Your physician should have your test results approximately 7 days after the  monitor has been mailed  back to [name].  Call [name] Customer Care at [phone] if you have questions regarding  your [name] patch monitor. Call them immediately if you see an orange light blinking on your  monitor.  If your monitor falls off in less than 4 days, contact our Monitor department at [phone].  If your monitor becomes loose or falls off after 4 days call [name] at [phone] for  suggestions on securing your monitor   Follow-Up: At [name], you and your health needs are our priority.  As part of our continuing mission to provide you with exceptional heart care, our providers are all part of one team.  This team includes your primary Cardiologist (physician) and Advanced Practice Providers or APPs (Physician Assistants and Nurse Practitioners) who all work together to provide you with the care you need, when you need it.  Your next appointment:   8 week(s)  Provider:   You will see one of the following Advanced Practice Providers on your designated Care Team:   Charlies Arthur, [place] Ozell Jodie Passey, PA-C Suzann Riddle, NP Daphne Barrack, NP Artist Pouch, PA-C   Next available with Dr Jordan or general cardiology APP.

## 2024-07-13 NOTE — Progress Notes (Unsigned)
 Enrolled for [name] to mail a [name] long term holter monitor to the patients address on file.   Dr. Lalla Brothers to read.

## 2024-07-13 NOTE — Assessment & Plan Note (Addendum)
 Has chronic back and shoulder pain, which pt previously took  hydrocodone -acetaminophen  5-325 mg q8h prn, but pt has had difficulty with insurance covering this. It is my medical opinion that pt should continue taking hydrocodone -acetaminophen  as part of his multimodal pain management. I have provided a short course of oxycodone  5mg  q8hprn while waiting for insurance approval.  - If approved, continue  hydrocodone -acetaminophen  5-325 mg q8h prn at next refill

## 2024-07-14 ENCOUNTER — Telehealth: Payer: Self-pay

## 2024-07-14 ENCOUNTER — Other Ambulatory Visit (HOSPITAL_COMMUNITY): Payer: Self-pay

## 2024-07-14 NOTE — Telephone Encounter (Addendum)
 Prior authorization submitted for oxyCODONE  HCl 5MG  tablets to [employer] via [employer].  Key: [id number]

## 2024-07-14 NOTE — Telephone Encounter (Signed)
 Re-attempting PA. New encounter created.

## 2024-07-15 NOTE — Telephone Encounter (Signed)
 Pharmacy Patient Advocate Encounter  Received notification from [employer] that Prior Authorization for OXYCODONE  5MG  has been APPROVED from 07/14/24 to 01/10/25   PA #/Case ID/Reference #: [id number]

## 2024-07-19 ENCOUNTER — Ambulatory Visit: Payer: Self-pay | Admitting: Student in an Organized Health Care Education/Training Program

## 2024-07-26 ENCOUNTER — Encounter (INDEPENDENT_AMBULATORY_CARE_PROVIDER_SITE_OTHER): Payer: Self-pay | Admitting: Physician Assistant

## 2024-07-26 ENCOUNTER — Ambulatory Visit (INDEPENDENT_AMBULATORY_CARE_PROVIDER_SITE_OTHER): Admitting: Audiology

## 2024-07-26 ENCOUNTER — Ambulatory Visit: Admitting: Cardiology

## 2024-07-26 ENCOUNTER — Ambulatory Visit (INDEPENDENT_AMBULATORY_CARE_PROVIDER_SITE_OTHER): Admitting: Physician Assistant

## 2024-07-26 VITALS — BP 132/82 | HR 85 | Temp 97.7°F | Ht 67.0 in | Wt 122.0 lb

## 2024-07-26 DIAGNOSIS — H903 Sensorineural hearing loss, bilateral: Secondary | ICD-10-CM | POA: Diagnosis not present

## 2024-07-26 DIAGNOSIS — H9041 Sensorineural hearing loss, unilateral, right ear, with unrestricted hearing on the contralateral side: Secondary | ICD-10-CM | POA: Diagnosis not present

## 2024-07-26 DIAGNOSIS — H9313 Tinnitus, bilateral: Secondary | ICD-10-CM

## 2024-07-26 DIAGNOSIS — H918X3 Other specified hearing loss, bilateral: Secondary | ICD-10-CM

## 2024-07-26 DIAGNOSIS — H9311 Tinnitus, right ear: Secondary | ICD-10-CM

## 2024-07-26 NOTE — Patient Instructions (Signed)
 I have ordered an imaging study for you to complete prior to your next visit. Please call [facility] Scheduling at [phone] to schedule your imaging if you have not received a call within 24 hours. If you are unable to complete your imaging study prior to your next scheduled visit please call our office to let us  know.
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
## 2024-07-26 NOTE — Progress Notes (Signed)
  [address] [place], [place] [address] [phone]  Audiological Evaluation    Name: Andrew Huerta     DOB:   1965/12/01      MRN:   [id number]                                                                                     Service Date: 07/26/2024     Accompanied by: unaccompanied   Patient comes today after Reyes Cohen, PA-C sent a referral for a hearing evaluation due to concerns with tinnitus.   Symptoms Yes Details  Hearing loss  [x]  Sometimes may not hear well  Tinnitus  [x]  Unsure if it comes from both ears,  started several years ago, constant, no issues with sleep  Ear pain/ infections/pressure  []    Balance problems  [x]  Sometimes getting up too quick  Noise exposure history  [x]  Noise in a kitchen.  Patient is a engineer, production  Previous ear surgeries  []    Family history of hearing loss  [x]  Mother possibly with age  Amplification  []    Other  [x]  Strong headaches - reportedly possibly to to cholesterol build up    Otoscopy: Right ear: Clear external ear canal and notable landmarks visualized on the tympanic membrane. Left ear:  Clear external ear canal and notable landmarks visualized on the tympanic membrane.  Tympanometry: Right ear: Normal external ear canal volume with normal middle ear pressure and tympanic membrane compliance (Type A). Findings are suggestive of normal middle ear function. Left ear: Normal external ear canal volume with normal middle ear pressure and low tympanic membrane compliance (Type As).  Hearing Evaluation The hearing test results were completed under inserts and results are deemed to be of good reliability. Test technique:  conventional    Pure tone Audiometry: Right ear- Normal to moderate sensorineural hearing loss from 125 Hz - 8000 Hz. Left ear-  Normal to moderate sensorineural hearing loss from 125 Hz - 8000 Hz.  There is a right hearing asymmetry overall, more significant from 3000-8000 Hz. Speech  Audiometry: Right ear- Speech Reception Threshold (SRT) was obtained at 30 dBHL. Left ear-Speech Reception Threshold (SRT) was obtained at 20 dBHL.   Word Recognition Score Tested using NU-6 (recorded) Right ear: 96% was obtained at a presentation level of 70 dBHL with contralateral masking which is deemed as  excellent. Left ear: 100% was obtained at a presentation level of 70 dBHL with contralateral masking which is deemed as  excellent.   Recommendations: Follow up with ENT as scheduled for today.  Return for a hearing evaluation in one year, before if concerns with hearing changes arise or per MD recommendation.  Consider a communication needs assessment after medical clearance for hearing aids is obtained.   Anndrea Mihelich MARIE LEROUX-MARTINEZ, AUD

## 2024-08-02 ENCOUNTER — Other Ambulatory Visit: Payer: Self-pay

## 2024-08-02 MED ORDER — OXYCODONE HCL 5 MG PO TABS: 5.0000 mg | ORAL_TABLET | Freq: Three times a day (TID) | ORAL | 0 refills | Status: DC | PRN

## 2024-08-10 ENCOUNTER — Encounter: Payer: Self-pay | Admitting: Audiology

## 2024-08-16 ENCOUNTER — Ambulatory Visit: Admitting: Family Medicine

## 2024-08-16 ENCOUNTER — Encounter: Payer: Self-pay | Admitting: Family Medicine

## 2024-08-16 VITALS — BP 120/88 | HR 78 | Ht 67.0 in | Wt 124.0 lb

## 2024-08-16 DIAGNOSIS — M542 Cervicalgia: Secondary | ICD-10-CM | POA: Diagnosis not present

## 2024-08-16 DIAGNOSIS — R42 Dizziness and giddiness: Secondary | ICD-10-CM | POA: Diagnosis not present
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
## 2024-08-16 NOTE — Patient Instructions (Addendum)
 1) We will get a few labs to check out your electrolytes, hemoglobin, and thyroid  levels that could explain your dizziness, confusion episode.  2) I am ordering a cervical spine xray for you. You can go to [facility] Radiology department to get this done. You do not need an appointment.   3) I will send a message to your heart doctor to see if your pacemaker is safe with MRI.   Come back to see me in 2-4 weeks to follow-up on these results.

## 2024-08-17 ENCOUNTER — Ambulatory Visit (HOSPITAL_COMMUNITY)
Admission: RE | Admit: 2024-08-17 | Discharge: 2024-08-17 | Disposition: A | Discharge status: Home / Self Care | Source: Ambulatory Visit | Attending: Family Medicine | Admitting: Family Medicine

## 2024-08-17 DIAGNOSIS — M542 Cervicalgia: Secondary | ICD-10-CM | POA: Insufficient documentation

## 2024-08-17 LAB — CBC
Hematocrit: 42.7 % (ref 37.5–51.0)
Hemoglobin: 14.4 g/dL (ref 13.0–17.7)
MCH: 31.4 pg (ref 26.6–33.0)
MCHC: 33.7 g/dL (ref 31.5–35.7)
MCV: 93 fL (ref 79–97)
Platelets: 202 x10E3/uL (ref 150–450)
RBC: 4.58 x10E6/uL (ref 4.14–5.80)
RDW: 12.3 % (ref 11.6–15.4)
WBC: 8.6 x10E3/uL (ref 3.4–10.8)

## 2024-08-17 LAB — TSH: TSH: 0.483 u[IU]/mL (ref 0.450–4.500)

## 2024-08-17 LAB — BASIC METABOLIC PANEL WITH GFR
BUN/Creatinine Ratio: 12 (ref 9–20)
BUN: 11 mg/dL (ref 6–24)
CO2: 26 mmol/L (ref 20–29)
Calcium: 9.7 mg/dL (ref 8.7–10.2)
Chloride: 100 mmol/L (ref 96–106)
Creatinine, Ser: 0.91 mg/dL (ref 0.76–1.27)
Glucose: 202 mg/dL — ABNORMAL HIGH (ref 70–99)
Potassium: 4.2 mmol/L (ref 3.5–5.2)
Sodium: 140 mmol/L (ref 134–144)
eGFR: 98 mL/min/1.73 (ref >59)

## 2024-08-23 ENCOUNTER — Ambulatory Visit: Payer: Self-pay | Admitting: Family Medicine

## 2024-08-31 ENCOUNTER — Other Ambulatory Visit: Payer: Self-pay

## 2024-08-31 DIAGNOSIS — G894 Chronic pain syndrome: Secondary | ICD-10-CM

## 2024-08-31 DIAGNOSIS — R42 Dizziness and giddiness: Secondary | ICD-10-CM

## 2024-08-31 DIAGNOSIS — M542 Cervicalgia: Secondary | ICD-10-CM

## 2024-08-31 NOTE — Telephone Encounter (Signed)
 Patient calls nurse line requesting a refill on pain medication.   He reports he is leaving for out of town early morning of 1/2. He reports a sudden death in the family, otherwise he would have called sooner.   He is requesting the medication be filled prior to leaving.   Advised will forward to PCP.

## 2024-09-03 DIAGNOSIS — I4891 Unspecified atrial fibrillation: Secondary | ICD-10-CM | POA: Diagnosis not present

## 2024-09-05 NOTE — Telephone Encounter (Signed)
 Patient returns call to nurse line regarding rx refill.   He is also asking if he should be scheduling follow up visit with Dr. Elicia based off of his recent test results.   Please advise.   Chiquita JAYSON English, RN

## 2024-09-06 ENCOUNTER — Telehealth: Payer: Self-pay

## 2024-09-06 MED ORDER — OXYCODONE HCL 5 MG PO TABS: 5.0000 mg | ORAL_TABLET | Freq: Three times a day (TID) | ORAL | 0 refills | Status: DC | PRN

## 2024-09-06 NOTE — Telephone Encounter (Signed)
 Patient calls nurse line in regards to upcoming Oncology apt.  He reports he saw on mychart he has an apt with [facility] on 01/16. He reports he doesn't know who this person is or why he has a follow-up.   Advised he saw her in 2025 for adrenal mass. Advised this is a followup apt.  He continues to report confusion on this and reports he doesn't remember anything about this at all.   He reports he would like to cancel this apt, however would like PCP recommendation on cancelling.   Advised will forward to PCP.

## 2024-09-06 NOTE — Telephone Encounter (Signed)
 Spoke to pt about sending oxy refills today. Also discussed XR results c/f carotid L calcification. In setting of syncope, I will order carotid US  for further eval. Additionally, have spoken to pts cardiologist Dr Cindie, who confirms pt pacemaker is MRI compatible. - Carotid US  ordered - Encouraged to reach out to Cardiologist to discuss heart monitor results. Reassured that no afib was captured. - Encouraged to reach out to ENT to schedule his MRI. Radiologist should also be double checking to make sure he is MRI safe with pacemaker.

## 2024-09-07 ENCOUNTER — Ambulatory Visit: Admitting: Physician Assistant

## 2024-09-12 ENCOUNTER — Ambulatory Visit (INDEPENDENT_AMBULATORY_CARE_PROVIDER_SITE_OTHER): Payer: BC Managed Care – PPO

## 2024-09-12 DIAGNOSIS — I472 Ventricular tachycardia, unspecified: Secondary | ICD-10-CM | POA: Diagnosis not present

## 2024-09-13 ENCOUNTER — Ambulatory Visit: Payer: Self-pay | Admitting: Student in an Organized Health Care Education/Training Program

## 2024-09-13 LAB — CUP PACEART REMOTE DEVICE CHECK
Battery Remaining Longevity: 144 mo
Battery Remaining Percentage: 100 %
Brady Statistic RV Percent Paced: 0 %
Date Time Interrogation Session: 20260112042100
HighPow Impedance: 77 Ohm
Implantable Lead Connection Status: 753985
Implantable Lead Implant Date: 20230414
Implantable Lead Location: 753860
Implantable Lead Model: 672
Implantable Lead Serial Number: 182871
Implantable Pulse Generator Implant Date: 20230414
Lead Channel Impedance Value: 831 Ohm
Lead Channel Pacing Threshold Amplitude: 0.6 V
Lead Channel Pacing Threshold Pulse Width: 0.4 ms
Lead Channel Setting Pacing Amplitude: 2.5 V
Lead Channel Setting Pacing Pulse Width: 0.4 ms
Lead Channel Setting Sensing Sensitivity: 0.5 mV
Pulse Gen Serial Number: 310848
Zone Setting Status: 755011

## 2024-09-15 ENCOUNTER — Encounter: Payer: Self-pay | Admitting: Gastroenterology

## 2024-09-15 NOTE — Progress Notes (Signed)
 Remote ICD Transmission

## 2024-09-15 NOTE — Progress Notes (Addendum)
 " [facility]    REASON FOR VISIT: enlarging right adrenal mass   INTERVAL HISTORY:  09/16/2024 History of Present Illness Andrew Huerta is a 59 year old male with PMH notable for primary hyperaldosteronism s/p left adrenalectomy who presents for follow-up of an enlarging right adrenal nodule. Interventional imaging is as follows:  03/23/2024 CT Abdomen pelvis w/contrast: Right adrenal mass of 4.0 x 2.7 cm is similar to the prior exam when re measured; unchanged and was characterized as an adenoma on 04/12/2019 MRI.   06/09/2024 CT Abdomen pelvis w/contrast: Right adrenal masses similar in size to prior examination when measured in similar fashion measuring 4.1 x 2.7 cm on image 15/2 previously 4.0 x 2.7 cm, previously characterized as an adenoma on MRI April 12, 2019.  09/09/2024 CT Abdomen w/o contrast at [facility]: Stable right adrenal adenoma measuring 3.0 x 2.6 cm (Series 3, #23). Suggestion of a second right adrenal adenoma, located central within the body of the adrenal gland.Impression: Benign right lipid rich adrenal adenoma measuring 3.0 cm. This requires no additional imaging follow-up  His biochemical evaluation showed no definite evidence of hormonal excess when completed in January of 2025. He initially reported having an allergy to corticosteroids and so a dexamethasone  suppression was not done. 2/3 midnight salivary cortisol levels were normal. One sample was elevated at 0.880 (<0.010-0.090), however it said the sample may be contaminated.   Other Medical History:  Gastrointestinal and oncologic history - Barrett's esophagus with high-grade dysplasia - Undergoes throat ablation approximately every three months  Cardiac arrhythmias and device therapy - Worsening cardiac issues over the past year - Frequent defibrillator discharges - Scheduled for cardiology follow-up - Current medications include hydrochlorothiazide , carvedilol , losartan , amiodarone  200 mg,  Eliquis , Lipitor , Jardiance  - BP is well controlled today at 134/86  Unintentional weight loss and gastrointestinal symptoms - Significant unintentional weight loss from approximately 160 lb to a low of 121-122 lb, currently 128-129 lb - Decreased appetite began approximately five months ago - Intermittent nausea and diarrhea - Increased intake of fatty and sweet foods to gain weight - Improved A1c; Last hemoglobin A1C was 6.7, which is greatly improved from prior when it was as high as 10.0 in 11/2020  Psychological symptoms - Depression related to health problems   Wt Readings from Last 3 Encounters:  09/16/24 58.6 kg (129 lb 3 oz)  09/16/23 66.7 kg (147 lb 0.8 oz)  11/28/13 69.8 kg (153 lb 14.1 oz)      09/16/2023 HPI: Andrew Huerta is a 59 y.o. male with a history notable for nephrolithiasis, primary hyperaldosteronism s/p left adrenalectomy on 11/17/2013 with Dr. Henrey, GERD with Barrett's esophagus, hypertension, OSA, CAD s/p PCI in 2015, 2020, and 2023 on Plavix  and aspirin , history of VT with cardiogenic shock now with ICD in April of 2023, HFrEF (last echo with LVEF 40-45%), type II DM referred for evaluation of a right adrenal mass recently noted on a CTA of the chest on 08/19/2023 at [facility] ED when being evaluated for chest pain . The mass measured approximately 3 cm and the radiology report noted slight interval increase in size of right adrenal adenoma previously measuring 2.3 cm. He was later seen by his PCP who noted the adrenal nodule and referred the patient to us  to evaluate. See imaging history below.  Imaging history:  11/04/2013 CT abdomen without IV contrast: The left adrenal gland contains a well-circumscribed low-attenuation mass which measures at -7 Hounsfield units in attenuation, suggesting that it  is an adenoma. There is nodular thickening of the right adrenal gland,suggesting that this gland may also contain adenomas. 01/12/2015  CT abdomen without IV  contrast:1 cm nodule in the right adrenal gland with Hounsfield unit measurement consistent with fat.  05/13/2016 CT abdomen with IV contrast: 1.8 cm enhancing nodule in the right adrenal gland which has increased compared to the previous examination. This was previously thought to represent an adenoma; given interval change in appearance, suggest further evaluation with non emergent MRI.  04/12/2019 MRI abdomen without contrast: There is a 2.5 cm right adrenal gland nodule which exhibits diffuse loss of signal on the out of phase sequences compatible with a benign adrenal gland adenoma. Adjacent right adrenal gland nodule measures 2.0 cm and also loses signal from the inphase to add of phase sequences compatible with a benign adenoma.  08/10/2019 CT abdomen without contrast: Right adrenal gland nodules, similar to comparison MRI, previously characterized as benign adenomas. No new or concerning adrenal lesions. 08/19/2023 CTA Chest with contrast: Right adrenal mass now measuring 3 cm, previously 2.3 cm. This was previously characterized as adenoma.   Symptoms: the patient reports worsening BP over the past several months which has mainly been managed by his PCP. He states that he has had to start new medications or increase dosage to control BP including losartan  (new), HCTZ (dosage increased), and carvedilol  (dosage increased). He has a significant history of CAD, arrhythmias, and HFrEF. He says has been on amiodarone  for 2 years. He also had type II DM, last HbA1C was 7.4. He states it has been as high as 11 in the past. He bruises easily but is also on dual antiplatelet therapy. He denies weight gain. He reports low energy which he attributes to his other health issues. He has intermittent headaches.   Family or personal history of endocrine cancers or endocrinopathies: Patient had left adrenalectomy in 2015 for primary hyperaldosteronism. Pathology showed a 2.5 cm benign adenoma within a background of  smaller nodules.  The patient does not have an endocrinologist.  Upon review of labs, his TSH has been suppressed recently. He tested negative for TRAbs.    07/15/2013  History of Present Illness:   Andrew Huerta is a 59 y.o. male referred for evaluation of a left adrenal mass and possible aldosterinoma.  He has has difficult to control hypertension for a number of years, which led to a work up for possible hyperaldosteronism 3 years ago at another institution after being hospitalized for hypertensive emergency.  He eventually underwent blood work that suggested the disease and had a CT scan in 12/2011 that demonstrated a 15x61mm left adrenal mass.  He  Subsequently underwent Adrenal venous sampling which was indeterminate because he lateralized to the Left PRE stimulation (Aldosterone/cortisol ration 48.8, Right 18.6), but on POST-stimulation sampling he lateralized to the Right (A/C ratio: 140; L - 0.03.)     Diagnosis was unclear, but he was started on spironolactone  at high doses which controlled his blood pressure for about 6-8 months, but caused him to develop gynecomastia, which was troubling.  He also states that in July this year, the spironolactone  stopped working right and cause him to have hypotension and dizziness, requiring hospitalization.  Since that time he has stopped taking it and is now achieving blood pressure control with labetolol and benazepril .  Current symptoms include hypertension, hypokalemia and headaches.  The patient denies symptoms of palpitations, tachycardia, diaphoresis, sweating, weight gain, Cushingoid features, obesity, glucose intolerance, diabetes mellitus and depression.  He also described intermittent chest pain radiating to his left arm, that sounds anginal, but was worked up, and he was told was not due to CAD.  The patient reports no personal family history of endocrinopathies or endocrine cancer.     Biochemical workup so far has included:   1:  Rule out hypercortisolism: - Low dose dexamethasone : unable to perform due to allergy to corticosteroids - 24-hr urine studies - Salivary cortisol:   Component     Latest Ref Rng 09/21/2023  #1 Salivary Cortisol - [name]     ug/dL 9.119   #2 Salivary Cortisol - [name]     ug/dL 9.951   #3 Salivary Cortisol - [name]     ug/dL 9.954    Note for #1 collection   Comment: The elevated cortisol concentration in this saliva specimen may be due to contamination during collection. We detected an abnormal ratio of cortisol relative to other glucocorticoid metabolites.    2: Rule out hyperaldosteronism   Component     Latest Ref Rng 09/16/2023  Aldosterone     <=21 ng/dL 5.6   Renin Activity, Plasma     ng/mL/h <0.6      - PA/PR ratio: 9.3     3: Rule out pheochromocytoma - Plasma metanephrines:   Component     Latest Ref Rng 09/16/2023  Normetanephrine, Pl     0.0 - 244.0 pg/mL 79.0   Metanephrine, Pl     0.0 - 88.0 pg/mL <25.0        4: Other pertinent work-up - DHEA-S:   Component     Latest Ref Rng 09/16/2023  DHEA (Dehydroepiandrosterone) -Sulfate     80 - 560 g/dL 64 (L)     Legend: (L) Low    -Thyroid  labs:  Component     Latest Ref Rng 09/16/2023  TSH     0.34 - 5.66 IU/mL 0.55   Thyroxine, Free (FT4)     0.52 - 1.21 ng/dL 8.88   Triiodothyronine (T3) Free     2.40 - 4.12 pg/mL 3.16   Thy Stim Immuno - SENDOUT [name]     0.00 - 0.55 IU/L <0.10            PAST MED HISTORY: Past Medical History:  Diagnosis Date   Arthritis    Barrett's esophagus    H/O gastroesophageal reflux (GERD)    Hyperaldosteronism (HHS-HCC)    Hypertension     SURGICAL HISTORY: Past Surgical History:  Procedure Laterality Date   JOINT REPLACEMENT     nephrolithiasis Right    x3 removal     MEDICATIONS:  Current Outpatient Medications  Medication Sig Dispense Refill   AMIOdarone  (PACERONE ) 200 MG tablet Take 1 tablet by mouth once daily      apixaban  (ELIQUIS ) 5 mg tablet Take 5 mg by mouth 2 (two) times daily     atorvastatin  (LIPITOR ) 80 MG tablet Take 80 mg by mouth once daily     carvediloL  (COREG ) 6.25 MG tablet Take 6.25 mg by mouth 2 (two) times daily     empagliflozin  (JARDIANCE ) 10 mg tablet Take 10 mg by mouth every morning before breakfast     glipiZIDE  (GLUCOTROL ) 5 MG tablet Take 5 mg by mouth once daily     hydroCHLOROthiazide  (HYDRODIURIL ) 25 MG tablet Take 1 tablet by mouth once daily     HYDROcodone -acetaminophen  (NORCO) 5-325 mg tablet Take 1-2 tablets by mouth     losartan  (COZAAR ) 50 MG tablet Take 50  mg by mouth at bedtime     metFORMIN  (GLUCOPHAGE -XR) 500 MG XR tablet Take 1,000 mg by mouth once daily     pantoprazole  (PROTONIX ) 40 MG DR tablet Take 40 mg by mouth 2 (two) times daily     sertraline  (ZOLOFT ) 100 MG tablet Take 200 mg by mouth once daily     dexAMETHasone  (DECADRON ) 1 MG tablet Take 1 tablet (1 mg total) by mouth once for 1 dose Low dose dexamethasone  suppression test: Take 1mg  dexamethasone  at 11pm 1 tablet 0   ezetimibe  (ZETIA ) 10 mg tablet Take 1 tablet by mouth once daily     No current facility-administered medications for this visit.    ALLERGIES: Allergies  Allergen Reactions   Cortizone-10 [Hydrocortisone] Swelling   Alprazolam  Anxiety and Other (See Comments)    Pt states causes him to be mean   Semaglutide  Nausea And Vomiting and Nausea    Also had skin reaction with first injection.  Tolerated second shot dermatologically.  GI intolerance lead to > 5 lb. weight loss in two weeks.   Darvocet A500 [Propoxyphene N-Acetaminophen ] Vomiting   Nsaids (Non-Steroidal Anti-Inflammatory Drug) Vomiting   Spironolactone  Other (See Comments)    gynecomastia  GYNECOMASTIA    gynecomastia  GYNECOMASTIA    GYNECOMASTIA  gynecomastia    gynecomastia  GYNECOMASTIA   Adhesive Itching and Rash    FAMILY HISTORY: Family History  Problem Relation Age of Onset   No  Known Problems Mother    No Known Problems Father     SOCIAL HISTORY: Social History   Socioeconomic History   Marital status: Legally Separated  Tobacco Use   Smoking status: Never   Smokeless tobacco: Never  Vaping Use   Vaping status: Never Used   Social Drivers of Corporate Investment Banker Strain: High Risk (07/10/2024)   Received from [facility]   Overall Financial Resource Strain (CARDIA)    How hard is it for you to pay for the very basics like food, housing, medical care, and heating?: Very hard  Food Insecurity: Food Insecurity Present (07/10/2024)   Received from [facility]   Hunger Vital Sign    Within the past 12 months, you worried that your food would run out before you got the money to buy more.: Often true    Within the past 12 months, the food you bought just didn't last and you didn't have money to get more.: Sometimes true  Transportation Needs: No Transportation Needs (07/10/2024)   Received from [facility] - Transportation    In the past 12 months, has lack of transportation kept you from medical appointments or from getting medications?: No    In the past 12 months, has lack of transportation kept you from meetings, work, or from getting things needed for daily living?: No  Physical Activity: Inactive (07/10/2024)   Received from [facility]   Exercise Vital Sign    On average, how many days per week do you engage in moderate to strenuous exercise (like a brisk walk)?: 0 days  Stress: Stress Concern Present (07/10/2024)   Received from [facility] - Occupational Stress Questionnaire    Do you feel stress - tense, restless, nervous, or anxious, or unable to sleep at night because your mind is troubled all the time - these days?: Very much  Social Connections: Socially Isolated (07/10/2024)   Received from [facility]   Social Connection and Isolation Panel  In a typical week, how many  times do you talk on the phone with family, friends, or neighbors?: Once a week    How often do you get together with friends or relatives?: Never    How often do you attend church or religious services?: Never    Do you belong to any clubs or organizations such as church groups, unions, fraternal or athletic groups, or school groups?: No    Are you married, widowed, divorced, separated, never married, or living with a partner?: Separated  Housing Stability: Unknown (09/16/2023)   Housing Stability Vital Sign    Homeless in the Last Year: Patient unable to answer     PHYSICAL EXAM General: well appearing male in no acute distress. BP 134/86 (BP Location: Right upper arm, Patient Position: Sitting, BP Cuff Size: Adult)   Pulse 94   Temp 36.6 C (97.8 F) (Oral)   Resp 18   Wt 58.6 kg (129 lb 3 oz)   SpO2 97%   BMI 20.30 kg/m  Head: NCAT.  Eyes:  anicteric.  Lungs:  Breathing comfortably Heart:  Regular rate and rhythm.  Abdomen: Soft, non tender, no distended. No Striae Skin:  No visible bruises. Extremities:  There is no clubbing, cyanosis, or edema.  Neurological/psychological: Non-focal, alert and oriented x 3.    IMAGING:     Results for orders placed during the hospital encounter of 09/09/24  CT ABDOMEN WITHOUT CONTRAST  Narrative CT abdomen without IV contrast  Comparison: CT chest 08/19/2023.  Indication: Adrenal mass, 1-4cm, incidental, no history of malignancy, E27.8 Other specified disorders of adrenal gland (HHS-HCC).  Technique:  CT imaging of the abdomen was performed without intravenous contrast. Coronal and sagittal reformatted images were generated and reviewed.  Findings: Limited assessment given the lack of IV contrast, particularly of the solid organs, bowel, and vasculature.  - Liver: Normal in size with smooth hepatic contour. Unremarkable noncontrast appearance.  - Biliary and Gallbladder: No intrahepatic or extrahepatic bile  duct dilatation. The gallbladder contains gallstones.  - Spleen: Normal in size.  - Pancreas: Unremarkable noncontrast appearance.  - Adrenal Glands: Stable right adrenal adenoma measuring 3.0 x 2.6 cm (Series 3, #23). Suggestion of a second right adrenal adenoma, located central within the body of the adrenal gland. Normal appearance of the left adrenal gland.  - Kidneys: Normal in size with normal cortical thickness. Nonobstructing renal calculi within the collecting calyces bilaterally, largest within the interpolar region of the right kidney measuring 1.0 cm. No hydronephrosis.  - Abdominal Vasculature: No abdominal aortic aneurysm. Moderate atherosclerotic plaque.  - Gastrointestinal Tract: Stomach and small bowel is normal in caliber. Moderate volume stool burden within the colon. Appendix is not visualized within the field of view.  - Peritoneum/Mesentery/Retroperitoneum: No free fluid.  No free intraperitoneal air.  - Lymph Nodes: No retroperitoneal or mesenteric lymphadenopathy.  - Body Wall: Unremarkable.  - Musculoskeletal:  No aggressive appearing osseous lesions.  Impression: Benign right lipid rich adrenal adenoma measuring 3.0 cm. This requires no additional imaging follow-up.  Electronically Reviewed by:  Toribio Childs, MD, [facility] Electronically Reviewed on:  09/09/2024 2:22 PM  I have reviewed the images and concur with the above findings.  Electronically Signed by:  Olam Rouse, MD, [facility] Electronically Signed on:  09/09/2024 4:19 PM   06/12/2024   CLINICAL DATA:  Periumbilical nodule.   EXAM:  CT ABDOMEN AND PELVIS WITH CONTRAST   TECHNIQUE:  Multidetector CT imaging of the abdomen and pelvis was performed  using the standard protocol following bolus administration of  intravenous contrast.   RADIATION DOSE REDUCTION: This exam was performed according to the  departmental dose-optimization program which includes automated   exposure control, adjustment of the mA and/or kV according to  patient size and/or use of iterative reconstruction technique.   CONTRAST:  OMNIPAQUE  IOHEXOL  300 MG/ML  SOLN   COMPARISON:  CT March 23, 2024   FINDINGS:  Lower chest: No acute abnormality. Partially visualized intracardiac  leads.   Hepatobiliary: No suspicious hepatic lesion. Cholelithiasis. No  biliary ductal dilation.   Pancreas: No pancreatic ductal dilation or evidence of acute  inflammation.   Spleen: No splenomegaly or focal splenic lesion.   Adrenals/Urinary Tract: Right adrenal masses similar in size to  prior examination when measured in similar fashion measuring 4.1 x  2.7 cm on image 15/2 previously 4.0 x 2.7 cm, previously  characterized as an adenoma on MRI April 12, 2019. Left adrenal  gland appears normal.   No hydronephrosis. Nonobstructive 4 mm left interpolar and punctate  right renal stones. Vague area of hypoenhancement in the upper pole  left kidney with overlying cortical renal scarring similar prior  favored a calyceal diverticulum but incompletely evaluated on this  examination. Fluid density right interpolar renal lesion is  compatible with a cyst.   Urinary bladder is unremarkable for minimal distension.   Stomach/Bowel: Stomach is unremarkable for degree of distension. No  pathologic dilation of small or large bowel. No evidence of acute  bowel inflammation.   Vascular/Lymphatic: Aortic and branch vessel atherosclerosis. Normal  caliber abdominal aorta. Smooth IVC contours. The portal, splenic  and superior mesenteric veins are patent. No pathologically enlarged  abdominal or pelvic lymph nodes.   Reproductive: Dystrophic calcifications of the prostate gland.   Other: No discrete abdominal wall nodule identified to correspond  with the clinical indication.   Musculoskeletal: No acute osseous abnormality.  Lumbar spondylosis.   IMPRESSION:  1. No discrete abdominal  wall nodule identified to correspond with  the clinical indication.  2. Vague area of hypoenhancement in the upper pole left kidney with  overlying cortical renal scarring similar prior favored a calyceal  diverticulum but incompletely evaluated on this examination.  Consider further evaluation by abdominal MRI with and without  contrast.  3. Nonobstructive 4 mm left interpolar and punctate right renal  stones.  4. Cholelithiasis.  5. Stable right adrenal mass previously characterized as an adenoma  on MRI April 12, 2019.    Electronically Signed    By: Reyes Holder M.D.    On: 06/12/2024 07:02    08/19/2023   CLINICAL DATA:  Left-sided chest pain   EXAM:  CT ANGIOGRAPHY CHEST WITH CONTRAST   CONTRAST:  75mL OMNIPAQUE  IOHEXOL  350 MG/ML SOLN   COMPARISON:  Chest x-ray 08/19/2023, CT chest 05/13/2016   FINDINGS:  Cardiovascular: Satisfactory opacification of the pulmonary arteries  to the segmental level. No evidence of pulmonary embolism. Normal  heart size. No pericardial effusion. Nonaneurysmal aorta. Mild  atherosclerosis. Coronary vascular calcification. Left-sided pacing  device.   Mediastinum/Nodes: Patent trachea. No thyroid  mass. No suspicious  lymph nodes. Esophagus within normal limits.   Lungs/Pleura: Lungs are clear. No pleural effusion or pneumothorax.   Upper Abdomen: Gallstone. Right adrenal mass now measuring 3 cm,  previously 2.3 cm. This was previously characterized as adenoma.   Musculoskeletal: No acute osseous abnormality.   Review of the MIP images confirms the above findings.   IMPRESSION:  1. Negative  for acute pulmonary embolus or aortic dissection. Clear  lung fields.  2. Gallstone.  3. Slight interval increase in size of right adrenal adenoma now  measuring 3 cm  4. Aortic atherosclerosis.   08/10/2019   CLINICAL DATA:  Acute generalized abdominal pain   EXAM:  CT ABDOMEN AND PELVIS WITH CONTRAST   CONTRAST:  OMNIPAQUE   IOHEXOL  350 MG/ML SOLN   COMPARISON:  MR abdomen April 12, 2019, CT Jan 12, 2015   Adrenals/Urinary Tract: Right adrenal gland nodules, similar to  comparison MRI, previously characterized as benign adenomas. No new  or concerning adrenal lesions. Intrarenal calculi in the left kidney  are similar to comparison CT from 2016. Fluid attenuation cyst is  seen in the interpolar posterior of the right kidney. Right  extrarenal pelvis is noted. Right renal vascular calcifications are  present. No worrisome renal lesions. No obstructive urolithiasis or  hydronephrosis. Mild bladder wall thickening is present. Mild  indentation of the bladder base by the enlarged prostate. No bladder  calculi or debris.    IMPRESSION:  1. Mild bladder wall thickening with indentation of the bladder base  by a borderline enlarged prostate. Could reflect sequela of outlet  obstruction though could consider urinalysis to exclude cystitis.  2. Hepatic steatosis.  3. Cholelithiasis without evidence of cholecystitis.  4. Right adrenal gland nodules, similar to comparison MRI,  previously characterized as benign adenomas.  5. Aortic Atherosclerosis (ICD10-I70.0).     04/12/2019   CLINICAL DATA:  Evaluate adrenal nodule.   EXAM:  MRI ABDOMEN WITHOUT CONTRAST   COMPARISON:  CT 05/13/2016   FINDINGS:   Adrenals/Urinary Tract: There is a 2.5 cm right adrenal gland nodule  which exhibits diffuse loss of signal on the out of phase sequences  compatible with a benign adrenal gland adenoma. Adjacent right  adrenal gland nodule measures 2.0 cm and also loses signal from the  inphase to add of phase sequences compatible with a benign adenoma.  Normal left adrenal gland. Right kidney cyst measures 1.8 cm. No  mass or hydronephrosis identified bilaterally.    IMPRESSION:  1. Right adrenal nodules have signal characteristics compatible with  a benign adenoma.  2. Mild hepatic steatosis.  3. Gallstones.     05/13/2016  CONTRAST:  100 mL Isovue  370 intravenous   CLINICAL DATA:  Patient from home with complaints of emesis since  yesterday. She reports generalized chest tightness and upper pain  between the shoulder blades. Patient has had history of MI with  stent placement. Report hiatal hernia.    CT ABDOMEN AND PELVIS WITH CONTRAST   COMPARISON:  01/12/2015, 01/09/2012, 12/05/2011 CT scan    Adrenals/Urinary Tract: There is a 1.7 cm enhancing nodule within  the right adrenal gland which has increased in size. There is  asymmetric thickening of the lateral limb of the right adrenal  gland. Bilateral intrarenal calculi are visualized, dominant stone  on the left is seen at the mid to lower pole and measures 8 mm. No  hydronephrosis. 1.5 cm exophytic cyst arising from medial cortex of  the mid right kidney. There is a focal area of hypo enhancement  within the anteromedial cortex of the upper pole left kidney which  may relate to an area of focal scarring. The urinary bladder is  unremarkable.    IMPRESSION:  1. No CT evidence for acute aortic dissection. No evidence for  aortic aneurysm.  2. Diffuse esophageal wall thickening with asymmetric wall  thickening present involving the  distal esophagus and GE junction.  Findings could relate to esophagitis with esophageal neoplasm  included in the differential. No gross mediastinal air or fluid  collection, or pleural effusion to suggest perforation.  3. No CT evidence for acute intra-abdominal or pelvic pathology  4. Gallstone  5. 1.8 cm enhancing nodule in the right adrenal gland which has  increased compared to the previous examination. This was previously  thought to represent an adenoma ; given interval change in  appearance, suggest further evaluation with non emergent MRI.  6. Bilateral kidney stones with no hydronephrosis. Subtle focal area  of hypoenhancement within the anteromedial cortex of the upper pole  of the  left kidney, possibly related to scarring.  7. Coarse prostate gland calcification     01/12/2015   CLINICAL DATA:  Left flank pain radiating to the back. Pain for a  week.   EXAM:  CT ABDOMEN AND PELVIS WITHOUT CONTRAST   TECHNIQUE:  Multidetector CT imaging of the abdomen and pelvis was performed  following the standard protocol without IV contrast.   COMPARISON:  12/05/2011   FINDINGS:  The lung bases are clear.    Cholelithiasis. No inflammatory changes around the gallbladder. The  unenhanced appearance of the liver, spleen, pancreas, inferior vena  cava, and retroperitoneal lymph nodes is unremarkable. Calcification  of the aorta without aneurysm. 1 cm nodule in the right adrenal  gland with Hounsfield unit measurement consistent with fat. This  consistent with benign adenoma. Left adrenal gland is unremarkable.  The stomach, small bowel, and colon are not abnormally distended. No  free air or free fluid in the abdomen.    IMPRESSION:  3 mm stone either in the left ureterovesical junction or recently  passed into the bladder. Moderate proximal hydronephrosis and  hydroureter on the left. Additional bilateral nonobstructing  intrarenal stones. Right adrenal gland adenoma. Cholelithiasis.    Results for orders placed in visit on 11/04/13  CT ABDOMEN WITHOUT CONTRAST  Narrative CT abdomen without IV contrast, November 04, 2013  Comparison: None.  Indication: 227.0 Benign neoplasm of adrenal gland, adrenal protocol, pt with left adrenal adenoma.  Technique:  CT imaging of the abdomen was performed without intravenous contrast. Coronal reformatted images were generated and reviewed.  Findings: Lung bases are unremarkable. The heart is normal. Small hiatal hernia.  Evaluation of abdominal viscera is limited by absence of IV contrast. The visualized liver appears normal. The gallbladder is normal. The spleen is unremarkable. The pancreas is normal.  The left  adrenal gland contains a well-circumscribed low-attenuation mass which measures at -7 Hounsfield units in attenuation, suggesting that it is an adenoma. There is nodular thickening of the right adrenal gland, suggesting that this gland may also contain adenomas. The kidneys are normal. The visualized ureters are normal.  Visualized large and small bowel are normal.  The mesentery is unremarkable. The soft tissues are normal.  Impression: 1. Left adrenal adenoma. 2. Low-attenuation nodular thickening of the right adrenal gland may represent adenomas in this region.  Electronically Reviewed by:  Elta Dess, MD Electronically Reviewed on:  11/04/2013 4:03 PM  I have reviewed the images and concur with the above findings.  Electronically Signed by:  Randine Dunnings, MD Electronically Signed on:  11/04/2013 4:17 PM      OTHER PERTINENT LABS:    Pathology (Left adrenalectomy) 11/17/2013     Component   DIAGNOSIS  A. Left adrenal, adrenalectomy:   Adrenal cortical adenoma, see Diagnostic Comment. Negative for malignancy.  I certify that I personally conducted the diagnostic evaluation of the above specimen(s) and have rendered the above diagnosis(es).  Electronically signed by Delayne Norleen FALCON, MD on 11/21/2013 at 1748  Diagnostic Comment   There is a 2.5 cm benign nodule consistent with adenoma. I also see a background of smaller nodules. I am considering the large nodule an adenoma rather than a dominant hyperplastic nodule. There is no carcinoma.  Clinical Information   Unspecified disorder of adrenal glands, neoplasm of uncertain behavior of adrenal gland, left.  Per [facility] Care: Patient is a 59 year old male with a history of difficult to control hypertension for a number of years, which lead to a workup for possible hyperaldosteronism 3 years ago at another institution after being hospitalized for hypertensive emergency.  CT scan in April 2013 demonstrates a 15 by 9  mm left adrenal mass.         Assessment & Plan Right adrenal mass The right adrenal mass has been slowly enlarging over time, measuring 3 x 2.6 cm on the most recent imaging from January 9th, 2026 at [facility]. It has measured up to 4.1 cm on recent imaging from [facility] on 06/09/2024, likely due to differing measuring techniques.  It appears low risk with low HU <10. I reviewed imaging with Dr. Henrey who thinks this could also represent a cyst. There is no current concern for malignancy. Previous lab work showed no evidence of biochemical excess: plasma aldosterone/renin, and plasma metanephrines were normal. DHEA-S was slightly low which is non-specific. Salivary cortisol tests were normal, except for one sample which was likely contaminated. The mass is being monitored due to its slow enlargement since 2015. - Ordered dexamethasone  suppression test to assess for excess cortisol production. The patient does not appear to have a true allergy to corticosteroids. Labs will be done locally at [facility]  - Scheduled follow-up in one year with an MRI of the abdomen to better characterize the mass as cyst vs. 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 The right adrenal gland has a nodule that has been monitored over time. There is no current indication for surgical intervention unless the right adrenal nodule becomes significantly larger or hormonally active. - Continue monitoring the right adrenal nodule with imaging and lab work.  Thyroid  labs also need to be monitored due to chronic amiodarone  use. He has history of suppressed TSH levels, though most recent labs have improved. Will recheck thyroid  levels at the same time as other labs.   He should continue to follow up with PCP and GI for unintentional weight loss and decreased appetite..    Recording duration: 19 minutes    I spent a total of 35  minutes in both face-to-face and non-face-to-face activities, excluding procedures performed, for this visit on the date of this encounter.   Attestation Statement:   I personally performed the service, non-incident to. (WP)   LEAH CHRISTINE HAWKINS, PA      "

## 2024-09-15 NOTE — Progress Notes (Signed)
 "  Cardiology Office Note    Date:  09/16/2024  ID:  Andrew Huerta, Andrew Huerta 13-Feb-1966, MRN [id number] PCP:  Elicia Hamlet, MD  Cardiologist:  Peter Jordan, MD  Electrophysiologist:  Donnice DELENA Primus, MD   Chief Complaint: Follow up for CAD  History of Present Illness: .   Andrew Huerta is a 59 y.o. male with visit-pertinent history of CAD s/p PCI to LAD/LCx in 2015, PCI to RCA in 2020, PCI to LAD in 2023, ICM, chronic systolic heart failure, VT s/p ICD, hypertension, hyperlipidemia, primary hyperaldosteronism, adrenal tumor s/p adrenal gland resection, Barrett's esophagus and OSA.  Patient with history of significant CAD with prior PCI in 2015 and in 2020.  Patient was hospitalized in April 2023 in setting of sustained VT resulting in cardiogenic shock.  Echo at the time showed EF 20 to 25%, akinesis of the LV basal mid inferoseptal wall and anterior septal wall, mildly reduced RV.  R/LHC revealed 90% mid RCA stenosis, 100% mid to distal RCA stenosis, 90% ostial to proximal LAD stenosis.  EP was consulted.  His monomorphic VT was felt to be scar mediated, underwent single lead based in scientific ICD implantation by Dr. Cindie, started on amiodarone  therapy.  He subsequently underwent staged PCI to the LAD.  He had an ICD shock in 01/2022 followed by ATP for VT.  He had recurrent ICD shock in 03/2022.  Amiodarone  was increased to 100 mg twice daily.  Repeat echo in 03/2022 showed EF improved to 40 to 45%, mildly decreased LV function, normal RV systolic function, no significant valvular abnormalities.  Patient was seen in clinic in 07/2023, DAPT was recommended indefinitely.   In 08/2023 patient was evaluated in the ED in setting of chest pain.  EKG was without significant changes, troponin was negative.  CT angio of the chest was negative for PE.  Patient was seen in clinic on 11/05/2023 for follow-up.  Patient continued to note intermittent chest discomfort, intermittent headaches, intermittent nausea  and vomiting.  Also reported intermittently elevated BP, on further discussion with patient he noted the symptoms are chronic and essentially unchanged from prior visits.  Patient was started on Ranexa  500 mg twice daily, 27-month follow-up recommended however not completed.  Patient was last seen in clinic on 07/13/2024 by Artist Pouch, PA, on 07/01/2024 patient was shocked by his device, EGM review suggested that this was inappropriate therapy for likely new onset A-fib with RVR.  Patient's device adjusted.  Patient was started on Eliquis  and 14-day [name] was recommended to assess burden, he was started on Toprol  XL 50 mg daily.  Cardiac monitor showed no evidence of atrial fibrillation.  Today he presents for follow-up.  He reports that he has remained stable from a cardiac standpoint.  Patient notes that he has multiple complex medical problems that can be very overwhelming.  Patient reports that he is no longer working full-time, has been out on disability.  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  He notes that he has chronic and stable chest pain that has been present for many years and is unchanged, notes stable DOE with prolonged exertion, denies any significant changes.  He reports occasional palpitations described as increased heart rates, denies any associated symptoms.  He denies any increased lower extremity edema, orthopnea or PND.  He denies any presyncope or syncope.  Patient notes that he has been having problems with neuropathy  in his feet and does not get restful sleep as a result of restless leg syndrome. ROS: .   Today he denies lower extremity edema, melena, hematuria, hemoptysis, diaphoresis, weakness, presyncope, syncope, orthopnea, and PND.  All other systems are reviewed and otherwise negative. Studies Reviewed: SABRA   EKG:  EKG is not ordered today.  CV Studies: Cardiac studies reviewed are outlined and summarized  above. Otherwise please see EMR for full report. Cardiac Studies & Procedures   ______________________________________________________________________________________________ CARDIAC CATHETERIZATION  CARDIAC CATHETERIZATION 12/26/2021  Conclusion   Ost LAD to Prox LAD lesion is 90% stenosed.   A drug-eluting stent was successfully placed using a STENT ONYX FRONTIER 3.0X30.   Post intervention, there is a 0% residual stenosis.  Successful PCI of the proximal to mid LAD with OCT guidance, orbital atherectomy and DES x 1 overlapping with stent in the mid LAD.  Plan: anticipate same day DC. DAPT with ASA and Plavix  indefinitely given multiple stents.  Findings Coronary Findings Diagnostic  Dominance: Right  Left Anterior Descending Ost LAD to Prox LAD lesion is 90% stenosed. The lesion is calcified. Non-stenotic Prox LAD lesion was previously treated. Non-stenotic Prox LAD to Mid LAD lesion was previously treated. Mid LAD lesion is 30% stenosed.  First Diagonal Branch Vessel is small in size.  Left Circumflex Non-stenotic Ost Cx to Prox Cx lesion was previously treated.  Right Coronary Artery Prox RCA lesion is 40% stenosed. Mid RCA lesion is 99% stenosed. Mid RCA to Dist RCA lesion is 100% stenosed.  Right Posterior Atrioventricular Artery Collaterals RPAV filled by collaterals from 3rd Sept.  Collaterals RPAV filled by collaterals from Dist Cx.  Intervention  Ost LAD to Prox LAD lesion Stent CATH VISTA GUIDE 6FR XBLAD3.5 guide catheter was inserted. Lesion crossed with guidewire using a WIRE ASAHI PROWATER 180CM. Pre-stent angioplasty was performed using a BALLN SAPPHIRE 2.5X15. A drug-eluting stent was successfully placed using a STENT ONYX FRONTIER 3.0X30. Stent strut is well apposed. Stent overlaps previously placed stent. Post-stent angioplasty was performed using a BALL SAPPHIRE NC24 3.25X18. Maximum pressure:  22 atm. Atherectomy CATH VISTA GUIDE 6FR XBLAD3.5  guide catheter was inserted. WIRE VIPERWIRE COR FLEX .012 guidewire was used to cross lesion. Orbital atherectomy was performed using a CROWN DIAMONDBACK CLASSIC 1.25. 5 passes taken. Post-Intervention Lesion Assessment The intervention was successful. Pre-interventional TIMI flow is 3. Post-intervention TIMI flow is 3. No complications occurred at this lesion. Optical coherence tomography (OCT) was performed. Minimum lumen area 1.5 mm. Minimum stent area 6.5 mm. There is a 0% residual stenosis post intervention.   CARDIAC CATHETERIZATION  CARDIAC CATHETERIZATION 12/12/2021  Conclusion   Mid LAD lesion is 30% stenosed.   Mid RCA to Dist RCA lesion is 100% stenosed.   Mid RCA lesion is 99% stenosed.   Prox RCA lesion is 40% stenosed.   Ost LAD to Prox LAD lesion is 90% stenosed.   Non-stenotic Prox LAD lesion was previously treated.   Non-stenotic Prox LAD to Mid LAD lesion was previously treated.   Non-stenotic Ost Cx to Prox Cx lesion was previously treated.   The left ventricular ejection fraction is 25-35% by visual estimate.  Findings:  Ao = 110/68 (86) LV = 115/16 RA = 10 RV = 30/13 PA = 28/15 (21) PCW = 14 Fick cardiac output/index = 5.2/2.9 PVR = 1.3 WU FA sat = 97% PA sat = 69%, 70% PAPi = 1.3  Assessment: 1. 3v CAD with 90% lesion in proximal LAD prior to previous stent  otherwise stable CAD with patency of previous LAD & LCx stents 2. iCM EF 30-35% 3. Relatively well-compensated hemodynamics with evidence of RV dysfunction  Plan/Discussion:  Suspect VT is scar-mediated and will need ICD. However, he also has significant progression of CAD in proximal LAD and will need PCI/atherectomy of this area. We will coordinate timing of ICD and PCI procedures to minimize risk.  Toribio Fuel, MD 9:20 AM  Findings Coronary Findings Diagnostic  Dominance: Right  Left Anterior Descending Ost LAD to Prox LAD lesion is 90% stenosed. The lesion is  calcified. Non-stenotic Prox LAD lesion was previously treated. Non-stenotic Prox LAD to Mid LAD lesion was previously treated. Mid LAD lesion is 30% stenosed.  First Diagonal Branch Vessel is small in size.  Left Circumflex Non-stenotic Ost Cx to Prox Cx lesion was previously treated.  Right Coronary Artery Prox RCA lesion is 40% stenosed. Mid RCA lesion is 99% stenosed. Mid RCA to Dist RCA lesion is 100% stenosed.  Right Posterior Atrioventricular Artery Collaterals RPAV filled by collaterals from 3rd Sept.  Collaterals RPAV filled by collaterals from Dist Cx.  Intervention  No interventions have been documented.   STRESS TESTS  NM MYOCAR MULTI W/SPECT W 07/25/2014  Narrative HISTORY OF PRESENT ILLNESS: Nuclear Med Background  Indication for Stress Test:  Chest pain  History: 59 y.o. male with a history of CAD. He had remote problems with adrenal abnormalities secondary to an adrenal tumor, s/p resection at [facility].  He had been getting chest pain daily for 3 years that was associated with left arm pain and occasional nausea and vomiting. He had consistent dyspnea on exertion that was eventually relieved by rest. He is active and was frustrated by his inability to increase his exercise tolerance and by how long it took him to regain his breath after exertion. The symptoms were felt secondary to the adrenal tumor.  He continued to have symptoms after the adrenal tumor was removed in February 2015, so eventually had a stress test which was abnormal.  Cardiac catheterization 08/27 showed two-vessel disease and he had drug-eluting stents to the mid LAD and mid circumflex. Other disease was nonobstructive.  He had a follow-up catheterization 08/31 for recurrent chest pain that showed nonobstructive disease, 20-30 percent, with patent stents and a preserved EF  He continues to have CP  Cardiac Risk Factors:  Prior CAD with stents  Symptoms:  Chest  pain  PROCEDURE: Nuclear Pre-Procedure  Caffeine/Decaff Intake:  NPO After: MN.  Lungs:  O2 Stat:  IV 0.9% NS with Angio Cath:  Chest Size (in):  Cup Size:  Height: 5'7  Weight:  160 lb  BMI:  Tech Comments:  Nuclear Med Study  1 or 2 day study: 1  Stress Test Type: Bruce  Reading MD: Hilty  Order Authorizing Provider: Nahser  Resting Radionuclide: Sestamibi  Resting Radionuclide Dose:  10 mCi  Stress Radionuclide: Sestamibi  Stress Radionuclide Dose:  30 mCi  Stress Protocol  Rest HR: 75  Stress HR: 169  Rest BP: 155/105  Stress BP: 178/105  Exercise Time (min): 10:50  METS: 13  Dose of Adenosine  (mg):  Dose of Lexiscan:  Dose of Atropine (mg):  Dose of Dobutamine:  Stress Test Technologist:  Nuclear Technologist:  Rest Procedure:  Stress Procedure:  Transient Ischemic Dilatation (Normal <1.22): 0.79  Lung/Heart Ratio (Normal <0.45): 0.29  QGS EDV:  74 ml  QGS ESV:  27 ml  LV Ejection Fraction: 64%  Rest ECG: NSR  Stress ECG:  No ischemic changes  Raw Data Images:  No artifact  Stress Images:  Mild distal anterior pefusion defect  Rest Images:  Normal perfusion  Subtraction (SDS): 3  IMPRESSION: Exercise Capacity: Excellent  BP Response: Hypertensive  Clinical Symptoms: None  ECG Impression:  No ischemia  Comparison with Prior Nuclear Study: None  Final Impression:  No significant reversible ischemia. Normal wall motion. EF 64%. Excellent exercise capacity. No ischemic EKG changes.   Electronically Signed By: Vinie JAYSON Maxcy On: 07/25/2014 13:12   ECHOCARDIOGRAM  ECHOCARDIOGRAM COMPLETE 03/03/2022  Narrative ECHOCARDIOGRAM REPORT    Patient Name:   Andrew Huerta [facility] Date of Exam: 03/03/2022 Medical Rec #:  [id number]       Height:       67.0 in Accession #:    [id number]      Weight:       152.0 lb Date of Birth:  November 12, 1965       BSA:          1.800 m Patient Age:    55 years        BP:            104/70 mmHg Patient Gender: M               HR:           72 bpm. Exam Location:  [address]  Procedure: 2D Echo, Color Doppler, Cardiac Doppler and 3D Echo  Indications:    Acute heart failure with reduced ejection fraction I50.22  History:        Patient has prior history of Echocardiogram examinations, most recent 12/11/2021. CAD; Risk Factors:Dyslipidemia, Diabetes and Hypertension.  Sonographer:    Augustin Seals RDCS Referring Phys: [id number] CALLIE E GOODRICH  IMPRESSIONS   1. Left ventricular ejection fraction, by estimation, is 40 to 45%. The left ventricle has mildly decreased function. The left ventricle demonstrates regional wall motion abnormalities (see scoring diagram/findings for description). Left ventricular diastolic parameters are indeterminate. 2. Right ventricular systolic function is normal. The right ventricular size is normal. Tricuspid regurgitation signal is inadequate for assessing PA pressure. 3. Right atrial size was mildly dilated. 4. The mitral valve is normal in structure. No evidence of mitral valve regurgitation. No evidence of mitral stenosis. 5. The aortic valve is tricuspid. Aortic valve regurgitation is not visualized. Aortic valve sclerosis is present, with no evidence of aortic valve stenosis. 6. The inferior vena cava is normal in size with greater than 50% respiratory variability, suggesting right atrial pressure of 3 mmHg.  FINDINGS Left Ventricle: Left ventricular ejection fraction, by estimation, is 40 to 45%. The left ventricle has mildly decreased function. The left ventricle demonstrates regional wall motion abnormalities. The left ventricular internal cavity size was normal in size. There is no left ventricular hypertrophy. Left ventricular diastolic parameters are indeterminate.   LV Wall Scoring: The inferior wall, mid inferoseptal segment, and basal inferoseptal segment are akinetic. The entire anterior wall, entire lateral  wall, entire anterior septum, and entire apex are normal.  Right Ventricle: The right ventricular size is normal. No increase in right ventricular wall thickness. Right ventricular systolic function is normal. Tricuspid regurgitation signal is inadequate for assessing PA pressure.  Left Atrium: Left atrial size was normal in size.  Right Atrium: Right atrial size was mildly dilated.  Pericardium: There is no evidence of pericardial effusion.  Mitral Valve: The mitral valve is normal in structure. No evidence of mitral valve regurgitation. No evidence of mitral valve stenosis.  Tricuspid Valve: The tricuspid valve is normal in structure. Tricuspid valve regurgitation is trivial.  Aortic Valve: The aortic valve is tricuspid. Aortic valve regurgitation is not visualized. Aortic valve sclerosis is present, with no evidence of aortic valve stenosis.  Pulmonic Valve: The pulmonic valve was not well visualized. Pulmonic valve regurgitation is not visualized.  Aorta: The aortic root and ascending aorta are structurally normal, with no evidence of dilitation.  Venous: The inferior vena cava is normal in size with greater than 50% respiratory variability, suggesting right atrial pressure of 3 mmHg.  IAS/Shunts: The interatrial septum was not well visualized.   LEFT VENTRICLE PLAX 2D LVIDd:         5.20 cm      Diastology LVIDs:         4.50 cm      LV e' medial:    5.51 cm/s LV PW:         0.90 cm      LV E/e' medial:  13.9 LV IVS:        1.00 cm      LV e' lateral:   6.65 cm/s LVOT diam:     2.40 cm      LV E/e' lateral: 11.5 LV SV:         73 LV SV Index:   41 LVOT Area:     4.52 cm  3D Volume EF: LV Volumes (MOD)            3D EF:        41 % LV vol d, MOD A2C: 117.0 ml LV EDV:       138 ml LV vol d, MOD A4C: 113.0 ml LV ESV:       81 ml LV vol s, MOD A2C: 71.1 ml  LV SV:        57 ml LV vol s, MOD A4C: 61.4 ml LV SV MOD A2C:     45.9 ml LV SV MOD A4C:     113.0 ml LV SV MOD  BP:      49.5 ml  RIGHT VENTRICLE RV Basal diam:  3.30 cm RV S prime:     11.20 cm/s  LEFT ATRIUM             Index        RIGHT ATRIUM           Index LA diam:        3.50 cm 1.94 cm/m   RA Area:     19.00 cm LA Vol (A2C):   49.5 ml 27.51 ml/m  RA Volume:   57.70 ml  32.06 ml/m LA Vol (A4C):   46.4 ml 25.78 ml/m LA Biplane Vol: 50.9 ml 28.28 ml/m AORTIC VALVE LVOT Vmax:   85.90 cm/s LVOT Vmean:  54.500 cm/s LVOT VTI:    0.162 m  AORTA Ao Root diam: 3.50 cm Ao Asc diam:  3.30 cm  MITRAL VALVE MV Area (PHT): 5.02 cm    SHUNTS MV Decel Time: 151 msec    Systemic VTI:  0.16 m MV E velocity: 76.50 cm/s  Systemic Diam: 2.40 cm MV A velocity: 76.50 cm/s MV E/A ratio:  1.00  Lonni Nanas MD Electronically signed by Lonni Nanas MD Signature Date/Time: 03/03/2022/3:40:06 PM    Final    MONITORS  LONG TERM MONITOR (3-14 DAYS) 08/30/2024  Narrative HR 63 to 207, average 96. 1 nonsustained VT lasting 6 beats. 5 SVT, longest 35.6 seconds with an average  rate of 100 bpm. Occasional ventricular ectopy, 4% Rare supraventricular ectopy. No atrial fibrillation.  Ole T. Cindie, MD, [facility], [facility] Cardiac Electrophysiology       ______________________________________________________________________________________________       Current Reported Medications:.    Active Medications[1]  Physical Exam:    VS:  BP 121/68   Pulse 86   Ht 5' 7 (1.702 m)   Wt 128 lb 9.6 oz (58.3 kg)   SpO2 99%   BMI 20.14 kg/m    Wt Readings from Last 3 Encounters:  09/16/24 128 lb 9.6 oz (58.3 kg)  08/16/24 124 lb (56.2 kg)  07/26/24 122 lb (55.3 kg)    GEN: Well nourished, well developed in no acute distress NECK: No JVD; No carotid bruits CARDIAC: RRR, no murmurs, rubs, gallops RESPIRATORY:  Clear to auscultation without rales, wheezing or rhonchi  ABDOMEN: Soft, non-tender, non-distended EXTREMITIES:  No edema; No acute deformity     Asessement and  Plan:.    CAD: S/p PCI to LAD/LCx in 2015, PCI to RCA in 2020, PCI to LAD in 2023.  Patient with intolerance to Imdur  and Ranexa .  Patient has history of chronic chest pain, patient reports that this is stable and denies any significant changes.  Reviewed ED precautions.  Continue aspirin , Eliquis , carvedilol , hydrochlorothiazide , losartan , Jardiance , and Lipitor .  ICM/chronic systolic heart failure: Prior EF 20 to 25%.  Echo in 03/2022 showed improvement in EF to 40 to 45%, mildly decreased LV function, normal RV systolic function, no significant valvular abnormalities.  Patient appears euvolemic and well compensated on exam, continue current medications.  History of VT: S/p ICD.  Device check on 09/13/2024 showed normal device function, patient with 4 episodes of NSVT, heart rates 171-207, no therapy.  Patient to follow up with Dr. Almetta on 09/20/24 regarding prior inappropriate discharge..  Paroxysmal atrial fibrillation: In 07/2024 patient was noted to have irregular irregular and rapid rhythm on ICD EGM, consistent with A-fib.  Patient was started on Eliquis , 14-day [name] was ordered to assess burden, showed no evidence of atrial fibrillation. Today he reports occasional feelings of increased heart rates, denies any significant changes.  Continue Eliquis  5 mg twice daily and metoprolol  succinate 50 mg daily.  Hyperlipidemia: Last lipid profile in 07/2023 indicated LDL 136.  He is without recent lipid profile, he is agreeable to fasting lipid profile and LFTs next week following appointment with Dr. Almetta.   Disposition: F/u with Dr. Almetta on 09/20/24, Dr. Jordan in three months.   Signed, Osker Ayoub D Lekendrick Alpern, NP       [1]  Current Meds  Medication Sig   amiodarone  (PACERONE ) 200 MG tablet Take 1 tablet (200 mg total) by mouth daily.   apixaban  (ELIQUIS ) 5 MG TABS tablet Take 1 tablet (5 mg total) by mouth 2 (two) times daily.   aspirin  EC 81 MG tablet Take 81 mg by mouth daily.    atorvastatin  (LIPITOR ) 80 MG tablet Take 1 tablet (80 mg total) by mouth daily.   empagliflozin  (JARDIANCE ) 10 MG TABS tablet Take 1 tablet (10 mg total) by mouth daily before breakfast.   escitalopram  (LEXAPRO ) 20 MG tablet Take 1 tablet (20 mg total) by mouth daily.   GI Cocktail (alum & mag hydroxide, lidocaine , dicyclomine) oral mixture Take 30 mLs by mouth in the morning, at noon, in the evening, and at bedtime. Swish and swallow 30 ml four times daily 90 ml lidocaine  90 ml 10 mg/5 ml dicyclomine 270 ml maalox   glipiZIDE  (GLUCOTROL )  5 MG tablet Take 1 tablet (5 mg total) by mouth daily.   hydrochlorothiazide  (HYDRODIURIL ) 25 MG tablet Take 1 tablet (25 mg total) by mouth daily.   metoprolol  succinate (TOPROL -XL) 50 MG 24 hr tablet Take 1 tablet (50 mg total) by mouth daily. Take with or immediately following a meal.   nitroGLYCERIN  (NITROSTAT ) 0.4 MG SL tablet Place 1 tablet (0.4 mg total) under the tongue every 5 (five) minutes as needed.   ondansetron  (ZOFRAN -ODT) 4 MG disintegrating tablet Take 1 tablet (4 mg total) by mouth every 8 (eight) hours as needed for nausea.   oxyCODONE  (OXY IR/ROXICODONE ) 5 MG immediate release tablet Take 1 tablet (5 mg total) by mouth 3 (three) times daily as needed for severe pain (pain score 7-10).   pantoprazole  (PROTONIX ) 40 MG tablet Take 1 tablet (40 mg total) by mouth 2 (two) times daily.   prazosin  (MINIPRESS ) 1 MG capsule Take 1 capsule (1 mg total) by mouth at bedtime.   sildenafil  (VIAGRA ) 50 MG tablet TAKE 1 TABLET BY MOUTH DAILY AS NEEDED FOR ERECTILE DYSFUNCTION   sucralfate  (CARAFATE ) 1 g tablet Take 1 tablet (1 g total) by mouth 4 (four) times daily. Crush and dissolve in 15-20 mL of fluid.   "

## 2024-09-16 ENCOUNTER — Ambulatory Visit
Admission: RE | Admit: 2024-09-16 | Discharge: 2024-09-16 | Disposition: A | Discharge status: Home / Self Care | Source: Ambulatory Visit | Attending: Family Medicine | Admitting: Family Medicine

## 2024-09-16 ENCOUNTER — Encounter: Payer: Self-pay | Admitting: Cardiology

## 2024-09-16 ENCOUNTER — Ambulatory Visit: Admitting: Cardiology

## 2024-09-16 VITALS — BP 121/68 | HR 86 | Ht 67.0 in | Wt 128.6 lb

## 2024-09-16 DIAGNOSIS — I480 Paroxysmal atrial fibrillation: Secondary | ICD-10-CM | POA: Diagnosis present

## 2024-09-16 DIAGNOSIS — E782 Mixed hyperlipidemia: Secondary | ICD-10-CM | POA: Insufficient documentation

## 2024-09-16 DIAGNOSIS — I5022 Chronic systolic (congestive) heart failure: Secondary | ICD-10-CM | POA: Insufficient documentation

## 2024-09-16 DIAGNOSIS — I2510 Atherosclerotic heart disease of native coronary artery without angina pectoris: Secondary | ICD-10-CM

## 2024-09-16 DIAGNOSIS — M542 Cervicalgia: Secondary | ICD-10-CM | POA: Diagnosis present

## 2024-09-16 DIAGNOSIS — I25118 Atherosclerotic heart disease of native coronary artery with other forms of angina pectoris: Secondary | ICD-10-CM | POA: Insufficient documentation

## 2024-09-16 DIAGNOSIS — I472 Ventricular tachycardia, unspecified: Secondary | ICD-10-CM | POA: Diagnosis present

## 2024-09-16 DIAGNOSIS — R42 Dizziness and giddiness: Secondary | ICD-10-CM | POA: Diagnosis present

## 2024-09-16 DIAGNOSIS — Z79899 Other long term (current) drug therapy: Secondary | ICD-10-CM

## 2024-09-16 NOTE — Patient Instructions (Signed)
 Medication Instructions:  Your physician recommends that you continue on your current medications as directed. Please refer to the Current Medication list given to you today.  *If you need a refill on your cardiac medications before your next appointment, please call your pharmacy*  Lab Work: Complete Labs on 09/20/24: Fasting Lipids, LFTs  If you have labs (blood work) drawn today and your tests are completely normal, you will receive your results only by: MyChart Message (if you have MyChart) OR A paper copy in the mail If you have any lab test that is abnormal or we need to change your treatment, we will call you to review the results.  Testing/Procedures: NONE  Follow-Up: At [facility], you and your health needs are our priority.  As part of our continuing mission to provide you with exceptional heart care, our providers are all part of one team.  This team includes your primary Cardiologist (physician) and Advanced Practice Providers or APPs (Physician Assistants and Nurse Practitioners) who all work together to provide you with the care you need, when you need it.  Your next appointment:   Keep up coming appointment with Dr. Almetta on 09/20/24  Needs 3 month follow up with Dr. Jordan only.

## 2024-09-20 ENCOUNTER — Ambulatory Visit: Attending: Cardiology | Admitting: Student in an Organized Health Care Education/Training Program

## 2024-09-20 ENCOUNTER — Encounter: Payer: Self-pay | Admitting: Student in an Organized Health Care Education/Training Program

## 2024-09-20 VITALS — BP 100/68 | HR 88 | Ht 67.0 in | Wt 128.0 lb

## 2024-09-20 DIAGNOSIS — Z95810 Presence of automatic (implantable) cardiac defibrillator: Secondary | ICD-10-CM | POA: Diagnosis present

## 2024-09-20 DIAGNOSIS — I472 Ventricular tachycardia, unspecified: Secondary | ICD-10-CM | POA: Diagnosis not present

## 2024-09-20 LAB — CUP PACEART INCLINIC DEVICE CHECK
Brady Statistic RV Percent Paced: 1 % — CL
Date Time Interrogation Session: 20260120105258
HighPow Impedance: 79 Ohm
HighPow Impedance: 84 Ohm
Implantable Lead Connection Status: 753985
Implantable Lead Implant Date: 20230414
Implantable Lead Location: 753860
Implantable Lead Model: 672
Implantable Lead Serial Number: 182871
Implantable Pulse Generator Implant Date: 20230414
Lead Channel Impedance Value: 781 Ohm
Lead Channel Pacing Threshold Amplitude: 0.7 V
Lead Channel Pacing Threshold Pulse Width: 0.4 ms
Lead Channel Sensing Intrinsic Amplitude: 17.3 mV
Lead Channel Setting Pacing Amplitude: 2.5 V
Lead Channel Setting Pacing Pulse Width: 0.4 ms
Lead Channel Setting Sensing Sensitivity: 0.5 mV
Pulse Gen Serial Number: 310848
Zone Setting Status: 755011

## 2024-09-20 NOTE — Patient Instructions (Signed)

## 2024-09-20 NOTE — Progress Notes (Signed)
 " Cardiology Office Note   Date:  09/20/2024  ID:  Andrew Huerta, Andrew Huerta [birth year], MRN [id number] PCP: Elicia Hamlet, MD  [facility] Providers Cardiologist:  Peter Jordan, MD Electrophysiologist:  Donnice DELENA Primus, MD   History of Present Illness Andrew Huerta is a 60 y.o. male with persistent AF/RVR, CAD s/p PCI LAD/L Circ (2015), PCI RCA (2012), PCI LAD (2023), HFrEF, VT s/p LEFT ICD, HTN, HLD, primary hyperaldosteronism, adrenal tumor s/p adrenal gland resection, Barrett's esophagus, and OSA who presents for management of both atrial and ventricular arrhythmias along with device follow-up.  Discussed the use of AI scribe software for clinical note transcription with the patient, who gave verbal consent to proceed.  History of Present Illness Andrew Huerta is a 59 year old male with a defibrillator who presents with concerns about arrhythmias.  He has been experiencing issues with his defibrillator, which has been delivering shocks due to misinterpretation of his heart rhythms. The defibrillator was initially set to detect heart rates at 170 beats per minute, but this threshold has been increased to 200 beats per minute to prevent unnecessary shocks.  He has been on amiodarone  since the defibrillator was implanted in 2023 to manage dangerous fast rhythms. The dosage was reduced to 100 mg at one point but was increased again following an episode. Despite medication, he continues to experience intermittent arrhythmias, including atrial fibrillation, which sometimes causes his heart rate to increase significantly, as noted during a recent episode after a shower.  He feels winded after showers and experiences heart racing episodes lasting a couple of minutes. He is currently taking Eliquis  as a blood thinner. No recent device shocks since reprogramming.  He lives with his two adult children, one of whom is disabled and the other has ADHD and autism. He recently transitioned to  disability from working in [employer] and [employer] due to his heart condition.  ROS: palpitations   Studies Reviewed  ECG review 07/13/24: NSR 93, PR 158, QRS 90, QT/c 362/450   TTE Result date: 03/03/22  1. Left ventricular ejection fraction, by estimation, is 40 to 45%. The  left ventricle has mildly decreased function. The left ventricle  demonstrates regional wall motion abnormalities (see scoring  diagram/findings for description). Left ventricular  diastolic parameters are indeterminate.   2. Right ventricular systolic function is normal. The right ventricular  size is normal. Tricuspid regurgitation signal is inadequate for assessing  PA pressure.   3. Right atrial size was mildly dilated.   4. The mitral valve is normal in structure. No evidence of mitral valve  regurgitation. No evidence of mitral stenosis.   5. The aortic valve is tricuspid. Aortic valve regurgitation is not  visualized. Aortic valve sclerosis is present, with no evidence of aortic  valve stenosis.   6. The inferior vena cava is normal in size with greater than 50%  respiratory variability, suggesting right atrial pressure of 3 mmHg.   Risk Assessment/Calculations  CHA2DS2-VASc Score = 4  This indicates a 4.8% annual risk of stroke. The patient's score is based upon: CHF History: 1 HTN History: 1 Diabetes History: 1 Stroke History: 0 Vascular Disease History: 1 Age Score: 0 Gender Score: 0  Physical Exam VS:  BP 100/68 (BP Location: Left Arm, Patient Position: Sitting, Cuff Size: Normal)   Pulse 88   Ht 5' 7 (1.702 m)   Wt 128 lb (58.1 kg)   SpO2 98%   BMI 20.05 kg/m   Wt Readings from  Last 3 Encounters:  09/20/24 128 lb (58.1 kg)  09/16/24 128 lb 9.6 oz (58.3 kg)  08/16/24 124 lb (56.2 kg)    GEN: Well nourished, well developed in no acute distress NECK: No JVD; No carotid bruits CARDIAC: RRR, no murmurs, rubs, gallops RESPIRATORY:  Clear to auscultation without rales, wheezing  or rhonchi  SKIN: LEFT infraclavicular incision well healed  EXTREMITIES:  No edema; No deformity   ASSESSMENT AND PLAN Andrew Huerta is a 59 y.o. male with persistent AF/RVR, CAD s/p PCI LAD/L Circ (2015), PCI RCA (2012), PCI LAD (2023), HFrEF, VT s/p LEFT ICD, HTN, HLD, primary hyperaldosteronism, adrenal tumor s/p adrenal gland resection, Barrett's esophagus, and OSA who presents for management of both atrial and ventricular arrhythmias along with device follow-up. Assessment & Plan Atrial fibrillation Intermittent episodes causing device shocks. Current management with amiodarone  is suboptimal. Ablation discussed as a potential treatment to reduce AFib episodes and prevent device shocks. Risks include stroke and heart wall damage, though these are rare. Common risks include bleeding from IV sites. Ablation reduces AFib by approximately 80% and is generally well tolerated. Shared decision-making led to agreement on proceeding with ablation after upcoming throat procedure for Barrett's esophagus. - Schedule ablation procedure after throat procedure for Barrett's esophagus. - Continue current medications including Eliquis . - Ensure blood thinner is taken the night before the procedure and not the morning of.  Discussed treatment options today for AF including antiarrhythmic drug therapy and ablation. Discussed risks, recovery and likelihood of success with each treatment strategy. Risk, benefits, and alternatives to EP study and ablation for afib were discussed. These risks include but are not limited to stroke, bleeding, vascular damage, tamponade, perforation, damage to the esophagus, lungs, phrenic nerve and other structures, worsening renal function, coronary vasospasm and death.  Discussed potential need for repeat ablation procedures and antiarrhythmic drugs after an initial ablation. The patient understands these risk and wishes to proceed.  We will therefore proceed with catheter ablation.  Carto, ICE, anesthesia are requested for the procedure.   Ventricular tachycardia with implantable cardioverter-defibrillator (ICD) Ventricular tachycardia managed with ICD. Recent reprogramming of ICD to increase threshold for shocks to 200 bpm to prevent unnecessary shocks due to AFib. No shocks since reprogramming. Current management with amiodarone  is ongoing. - Continue current ICD settings and amiodarone  therapy.  Pre procedure details: Procedure date: pending, he will call in February after Barrett's eso appt/procedure  Carto/ICE/GA-anesthesia, no CT scan pre procedure  Hold apixaban  and all medications the morning of the procedure Take apixaban  and all medications the night prior   Dispo: RTC 6 months   Signed, Donnice DELENA Primus, MD  "

## 2024-10-04 ENCOUNTER — Other Ambulatory Visit: Payer: Self-pay

## 2024-10-04 DIAGNOSIS — G894 Chronic pain syndrome: Secondary | ICD-10-CM

## 2024-10-04 MED ORDER — OXYCODONE HCL 5 MG PO TABS: 5.0000 mg | ORAL_TABLET | Freq: Three times a day (TID) | ORAL | 0 refills | Status: AC | PRN

## 2024-11-01 ENCOUNTER — Ambulatory Visit: Admitting: Physician Assistant

## 2024-11-02 ENCOUNTER — Ambulatory Visit (HOSPITAL_COMMUNITY): Payer: Self-pay | Admitting: Psychiatry

## 2024-12-19 ENCOUNTER — Ambulatory Visit: Admitting: Cardiology
